# Patient Record
Sex: Male | Born: 1990 | Race: White | Hispanic: No | Marital: Married | Smoking: Current some day smoker
Health system: Southern US, Community
[De-identification: ages and names within clinical notes are randomized; demographics above are authoritative.]

## PROBLEM LIST (undated history)

## (undated) ENCOUNTER — Emergency Department (HOSPITAL_COMMUNITY): Admission: EM | Payer: Non-veteran care | Source: Home / Self Care

## (undated) DIAGNOSIS — F4310 Post-traumatic stress disorder, unspecified: Secondary | ICD-10-CM

## (undated) DIAGNOSIS — G8220 Paraplegia, unspecified: Secondary | ICD-10-CM

## (undated) DIAGNOSIS — F200 Paranoid schizophrenia: Secondary | ICD-10-CM

## (undated) DIAGNOSIS — F419 Anxiety disorder, unspecified: Secondary | ICD-10-CM

## (undated) HISTORY — PX: OTHER SURGICAL HISTORY: SHX169

---
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
## 2012-09-13 ENCOUNTER — Encounter (HOSPITAL_COMMUNITY): Payer: Self-pay | Admitting: Emergency Medicine

## 2012-09-13 ENCOUNTER — Emergency Department (HOSPITAL_COMMUNITY)
Admission: EM | Admit: 2012-09-13 | Discharge: 2012-09-15 | Disposition: A | Discharge status: Home / Self Care | Payer: Non-veteran care | Attending: Emergency Medicine | Admitting: Emergency Medicine

## 2012-09-13 DIAGNOSIS — F4310 Post-traumatic stress disorder, unspecified: Secondary | ICD-10-CM | POA: Insufficient documentation

## 2012-09-13 DIAGNOSIS — F200 Paranoid schizophrenia: Secondary | ICD-10-CM | POA: Insufficient documentation

## 2012-09-13 DIAGNOSIS — R45851 Suicidal ideations: Secondary | ICD-10-CM | POA: Insufficient documentation

## 2012-09-13 DIAGNOSIS — F411 Generalized anxiety disorder: Secondary | ICD-10-CM | POA: Insufficient documentation

## 2012-09-13 DIAGNOSIS — F17200 Nicotine dependence, unspecified, uncomplicated: Secondary | ICD-10-CM | POA: Insufficient documentation

## 2012-09-13 HISTORY — DX: Paranoid schizophrenia: F20.0

## 2012-09-13 HISTORY — DX: Post-traumatic stress disorder, unspecified: F43.10

## 2012-09-13 HISTORY — DX: Anxiety disorder, unspecified: F41.9

## 2012-09-13 LAB — COMPREHENSIVE METABOLIC PANEL
ALT: 14 U/L (ref 0–53)
AST: 18 U/L (ref 0–37)
Albumin: 3.8 g/dL (ref 3.5–5.2)
Alkaline Phosphatase: 50 U/L (ref 39–117)
BUN: 10 mg/dL (ref 6–23)
CO2: 28 mEq/L (ref 19–32)
Calcium: 9 mg/dL (ref 8.4–10.5)
Chloride: 105 mEq/L (ref 96–112)
Creatinine, Ser: 0.79 mg/dL (ref 0.50–1.35)
GFR calc Af Amer: >90 mL/min (ref >90)
GFR calc non Af Amer: >90 mL/min (ref >90)
Glucose, Bld: 77 mg/dL (ref 70–99)
Potassium: 4.2 mEq/L (ref 3.5–5.1)
Sodium: 141 mEq/L (ref 135–145)
Total Bilirubin: 0.2 mg/dL — ABNORMAL LOW (ref 0.3–1.2)
Total Protein: 6.9 g/dL (ref 6.0–8.3)

## 2012-09-13 LAB — CBC WITH DIFFERENTIAL/PLATELET
Basophils Absolute: 0 10*3/uL (ref 0.0–0.1)
Basophils Relative: 1 % (ref 0–1)
Eosinophils Absolute: 0.1 10*3/uL (ref 0.0–0.7)
Eosinophils Relative: 3 % (ref 0–5)
HCT: 39.1 % (ref 39.0–52.0)
Hemoglobin: 14 g/dL (ref 13.0–17.0)
Lymphocytes Relative: 51 % — ABNORMAL HIGH (ref 12–46)
Lymphs Abs: 2.2 10*3/uL (ref 0.7–4.0)
MCH: 32.7 pg (ref 26.0–34.0)
MCHC: 35.8 g/dL (ref 30.0–36.0)
MCV: 91.4 fL (ref 78.0–100.0)
Monocytes Absolute: 0.2 10*3/uL (ref 0.1–1.0)
Monocytes Relative: 6 % (ref 3–12)
Neutro Abs: 1.8 10*3/uL (ref 1.7–7.7)
Neutrophils Relative %: 41 % — ABNORMAL LOW (ref 43–77)
Platelets: 188 10*3/uL (ref 150–400)
RBC: 4.28 MIL/uL (ref 4.22–5.81)
RDW: 13 % (ref 11.5–15.5)
WBC: 4.3 10*3/uL (ref 4.0–10.5)

## 2012-09-13 LAB — RAPID URINE DRUG SCREEN, HOSP PERFORMED
Opiates: NOT DETECTED
Tetrahydrocannabinol: POSITIVE — AB

## 2012-09-13 LAB — SALICYLATE LEVEL: Salicylate Lvl: <2 mg/dL — ABNORMAL LOW (ref 2.8–20.0)

## 2012-09-13 LAB — ACETAMINOPHEN LEVEL: Acetaminophen (Tylenol), Serum: <15 ug/mL (ref 10–30)

## 2012-09-13 MED ORDER — ESCITALOPRAM OXALATE 10 MG PO TABS
10.0000 mg | ORAL_TABLET | Freq: Every day | ORAL | Status: DC
  Filled 2012-09-13: qty 1

## 2012-09-13 MED ORDER — ZOLPIDEM TARTRATE 5 MG PO TABS: 5.0000 mg | ORAL_TABLET | Freq: Every evening | ORAL | Status: DC | PRN

## 2012-09-13 MED ORDER — LORAZEPAM 1 MG PO TABS: 1.0000 mg | ORAL_TABLET | Freq: Three times a day (TID) | ORAL | Status: DC | PRN

## 2012-09-13 MED ORDER — ONDANSETRON HCL 4 MG PO TABS: 4.0000 mg | ORAL_TABLET | Freq: Three times a day (TID) | ORAL | Status: DC | PRN

## 2012-09-13 MED ORDER — ACETAMINOPHEN 325 MG PO TABS: 650.0000 mg | ORAL_TABLET | ORAL | Status: DC | PRN

## 2012-09-13 MED ORDER — IBUPROFEN 600 MG PO TABS: 600.0000 mg | ORAL_TABLET | Freq: Three times a day (TID) | ORAL | Status: DC | PRN

## 2012-09-13 MED ORDER — ALUM & MAG HYDROXIDE-SIMETH 200-200-20 MG/5ML PO SUSP: 30.0000 mL | ORAL | Status: DC | PRN

## 2012-09-13 NOTE — ED Notes (Signed)
telepsych in progress 

## 2012-09-13 NOTE — ED Notes (Signed)
Up to the bathroom 

## 2012-09-13 NOTE — ED Notes (Signed)
Pt transferred from triage, presents for medical clearance.  Pt SI with plan to shoot himself.  Initial triage, states pt had knife in hand with plan to cut his throat.  Pt is a veteran who suffers from PTSD, Anxiety, Depression, Paranoid Schizophrenia. Denies AV hallucinations  Feeling hopeless at present.  Admits to alcohol & marijuana use.  Pt calm & cooperative at present.

## 2012-09-13 NOTE — ED Notes (Signed)
Pt sitting in room,wanting to leave.  Pt reports he is "very particular" about certain things and pointed to the camera, and also stated he was paranoid about things.  ACT is aware that pt is requesting to leave and will see.

## 2012-09-13 NOTE — ED Notes (Signed)
Pt presents to the ED with a complaint of suicide.  Pt was picked up by [employer] with a knife in his hand and makes the statement" I just wanted the police to come at me so I could place the knife against my throat and kill myself."

## 2012-09-13 NOTE — ED Provider Notes (Addendum)
Pt resting, nad. Discussed w act team -- placement pending.  telepsych recommends inpt psych tx and starting lexapro 10 mg a day  Suzi Roots, MD 09/13/12 1610  Suzi Roots, MD 09/13/12 1023

## 2012-09-13 NOTE — ED Notes (Signed)
EDP is aware that the pt is wanting to leave.  Pt sitting on the bed, reports that he is "a used,up,useless person...does not matter..." Pt also reports that he is not going to eat or drink anything because he is afraid it is poisoned and that he has been poisoned before.  Pt is aware that the psychatrist has recommended admission, and that ACT will be in shortly.

## 2012-09-13 NOTE — ED Notes (Signed)
Pt agreed to eat sandwich/soda if ACT gave it to him.

## 2012-09-13 NOTE — ED Provider Notes (Signed)
History     CSN: [id number]  Arrival date & time 09/13/12  6295   First MD Initiated Contact with Patient 09/13/12 0254      Chief Complaint  Patient presents with  . Medical Clearance    (Consider location/radiation/quality/duration/timing/severity/associated sxs/prior treatment) HPI Comments: Patient with a history of PTSD and Paranoid Schizophrenia presents to the ED voluntarily due to having suicidal thoughts.  He reports that he has been having these thoughts for several days.  Today his plan was to slit his throat with a knife.  Police were called and brought him to the ED.  When police arrived at the home the patient had a knife in his hand.  Patient did not cut himself.  He denies HI.  Patient reports that he has been increasingly depressed over the past year.  He is currently not on any psychiatric medications.  He reports that he drinks alcohol a couple of times a week.  Last drink was yesterday.  No recent drug use.    The history is provided by the patient.    Past Medical History  Diagnosis Date  . Anxiety   . PTSD (post-traumatic stress disorder) Paranoid  . Paranoid schizophrenia     History reviewed. No pertinent past surgical history.  History reviewed. No pertinent family history.  History  Substance Use Topics  . Smoking status: Current Some Day Smoker  . Smokeless tobacco: Not on file  . Alcohol Use: Yes      Review of Systems  Psychiatric/Behavioral: Positive for suicidal ideas, sleep disturbance and dysphoric mood. Negative for hallucinations and confusion.  All other systems reviewed and are negative.    Allergies  Sulfa antibiotics  Home Medications  No current outpatient prescriptions on file.  BP 99/59  Pulse 57  Temp 97.4 F (36.3 C) (Oral)  Resp 18  SpO2 97%  Physical Exam  Nursing note and vitals reviewed. Constitutional: He appears well-developed and well-nourished.  HENT:  Head: Normocephalic and atraumatic.  Eyes: Pupils  are equal, round, and reactive to light.  Neck: Normal range of motion. Neck supple.  Cardiovascular: Normal rate, regular rhythm and normal heart sounds.   Pulmonary/Chest: Effort normal and breath sounds normal.  Neurological: He is alert.  Skin: Skin is warm and dry.  Psychiatric: His speech is normal. He is not agitated, not aggressive and not combative. He exhibits a depressed mood. He expresses suicidal ideation. He expresses no homicidal ideation. He expresses suicidal plans. He expresses no homicidal plans.    ED Course  Procedures (including critical care time)  Labs Reviewed  CBC WITH DIFFERENTIAL - Abnormal; Notable for the following:    Neutrophils Relative 41 (*)     Lymphocytes Relative 51 (*)     All other components within normal limits  COMPREHENSIVE METABOLIC PANEL - Abnormal; Notable for the following:    Total Bilirubin 0.2 (*)     All other components within normal limits  ETHANOL - Abnormal; Notable for the following:    Alcohol, Ethyl (B) 119 (*)     All other components within normal limits  SALICYLATE LEVEL - Abnormal; Notable for the following:    Salicylate Lvl <2.0 (*)     All other components within normal limits  ACETAMINOPHEN LEVEL  URINE RAPID DRUG SCREEN (HOSP PERFORMED)   No results found.   No diagnosis found.    MDM  Patient presents with suicidal thoughts with plan.  ACT team has been notified.  Psych holding orders have  been placed.        Pascal Lux [place], PA-C 09/13/12 [phone]

## 2012-09-13 NOTE — ED Notes (Signed)
Telephone report from telepsych--recommends admission and IVC-will fax report

## 2012-09-13 NOTE — ED Notes (Signed)
ACT into see 

## 2012-09-13 NOTE — ED Notes (Signed)
telepsych infor faxed and called

## 2012-09-13 NOTE — ED Provider Notes (Signed)
Medical screening examination/treatment/procedure(s) were performed by non-physician practitioner and as supervising physician I was immediately available for consultation/collaboration.  Sunnie Nielsen, MD 09/13/12 [phone]

## 2012-09-13 NOTE — ED Notes (Signed)
ACT in w/ pt 

## 2012-09-13 NOTE — BH Assessment (Signed)
Assessment Note   Steve [name] is an 21 y.o. male. Patient present to [facility] with a history of PTSD and Paranoid Schizophrenia. He was brought to [facility] via police. They were called (unk caller) and pt was brought to the ED.  Reportedly when police arrived at the home the patient had a knife in his hand. Patient did not cut himself. Pt told examining physician at  [facility] that he has a plan to slit his throat. He also admitted to this writer that he continues to have suicidal thoughts. He has been having increased thoughts for the past several days. He however denied a suicidal plan.  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  He was hospitalized in [place] "last summer" after (1)overdosing and (2)verbalizing suicidal gestures. Patients deployment ended November 2014. Patient reports that he has been increasingly depressed over the past year. His depression is triggered by limited support from family/friends, spouse leaving him, and currently living with a friend after being kicked out of mothers home. He is currently not on any psychiatric medications. No HI. Patient denies AVH's but sts, "People tell me I am hearing voices". Unable to determine if patient is experiencing AVH's. However, patient appears minimally paranoid. He refused food & drinks due to fear of staff poisoning him. Patient agreed to take food only from this writer (sand which and drink were given;patient observed consuming food/drink).  He reports that he drinks alcohol 1-2 times a week. Last drink was yesterday; pt drank 6 beers and 6 shots. He uses THC every other day; last used yesterday. Pt also using methamphetamines (IV) 2x's per month and last used 1 week ago.   Patient completed a telepsych consult this am and inpatient was recommended. Patient requested a 2nd telepsych stating that he was intoxicated when the first one was completed. The 2nd telepsych was  initiated and completed--inpatient was recommended again.   Pt referred to [facility] for inpatient treatment.     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 Anxiety   . PTSD (post-traumatic stress disorder) Paranoid  . Paranoid schizophrenia    Axis IV: housing problems, other psychosocial or environmental problems, problems related to social environment, problems with access to health care services and problems with primary support group Axis V: 31-40 impairment in reality testing  Past Medical History:  Past Medical History  Diagnosis Date  . Anxiety   . PTSD (post-traumatic stress disorder) Paranoid  . Paranoid schizophrenia     History reviewed. No pertinent past surgical history.  Family History: History reviewed. No pertinent family history.  Social History:  reports that he has been smoking.  He does not have any smokeless tobacco history on file. He reports that he drinks alcohol. He reports that he uses illicit drugs (Marijuana).  Additional Social History:  Alcohol / Drug Use Pain Medications: SEE MAR Prescriptions: SEE MAR Over the Counter: SEE MAR History of alcohol / drug use?: Yes Longest period of sobriety (when/how long): unk Negative Consequences of Use: Personal relationships Substance #1 Name of Substance 1: Alcohol  1 - Age of First Use: 13 1 - Amount (size/oz): "a couple of beers"; "varies" 1 - Frequency: 1-2x's per week  1 - Duration: since age 51 1 - Last Use / Amount: 09/12/12 to 09/13/12 (7pm to 2am); pt drank 6 beers and  6 shots Substance #2 Name of Substance 2: THC 2 - Age of First Use: 13 2 - Amount (size/oz): varies 2 - Frequency: "every other day" 2 - Duration: since age 60 on and off 2 - Last Use / Amount: 09/13/2012 prior to arrival Substance #3 Name of Substance 3: Methamphetamine (IV use) 3 - Age of First Use: 21 3 - Amount  (size/oz): Pt sts, "It's not alot" 3 - Frequency: 2x's per month 3 - Duration: ongoing since age 70 3 - Last Use / Amount: 1 week ago  CIWA: CIWA-Ar BP: 95/53 mmHg (RN Janie notified) Pulse Rate: 57  COWS:    Allergies:  Allergies  Allergen Reactions  . Sulfa Antibiotics Rash    Home Medications:  (Not in a hospital admission)  OB/GYN Status:  No LMP for male patient.  General Assessment Data Location of Assessment: [facility] Living Arrangements: Other (Comment) (living with a friend) Can pt return to current living arrangement?: Yes Admission Status: Voluntary Is patient capable of signing voluntary admission?: Yes Transfer from: Acute Hospital Referral Source: Self/Family/Friend  Education Status Is patient currently in school?: No  Risk to self Suicidal Ideation: Yes-Currently Present Suicidal Intent: No (denies intent during assessment; ED notes differs-see note) Is patient at risk for suicide?: Yes Suicidal Plan?: Yes-Currently Present (pt denies during assessment; per ED notes slit throat) Specify Current Suicidal Plan:  (pt denies but per ED notes wanted to slit throat) Access to Means: Yes Specify Access to Suicidal Means:  (sharp objects; knives) What has been your use of drugs/alcohol within the last 12 months?:  (Alcohol, THC, and Methamphetamine) Previous Attempts/Gestures: Yes How many times?:  (1 prior attempt) Other Self Harm Risks:  (cutting (pt has several superficial cuts on arm)) Triggers for Past Attempts: Other (Comment) (pt sts he wanted to get out of the [employer]) Intentional Self Injurious Behavior: Cutting Comment - Self Injurious Behavior:  (pt is calm and cooperative) Family Suicide History: No Recent stressful life event(s): Other (Comment) (no support; 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, living arrangements) Persecutory voices/beliefs?: No Depression: Yes Depression Symptoms: Loss of interest in usual pleasures;Despondent;Feeling worthless/self  pity Substance abuse history and/or treatment for substance abuse?: No Suicide prevention information given to non-admitted patients: Not applicable  Risk to Others Homicidal Ideation: No Thoughts of Harm to Others: No Current Homicidal Intent: No Current Homicidal Plan: No Access to Homicidal Means: No Identified Victim:  (none reported) History of harm to others?: No Assessment of Violence: None Noted Violent Behavior Description:  (pt is calm and cooperative) Does patient have access to weapons?: No Criminal Charges Pending?: No Does patient have a court date: No  Psychosis Hallucinations: Auditory ("People tell me I hear things but it's not true") Delusions: Unspecified (pt denies AVH's)  Mental Status Report Appear/Hygiene: Disheveled Eye Contact: Good Motor Activity: Freedom of movement Speech: Logical/coherent Level of Consciousness: Alert Mood: Preoccupied;Sad;Suspicious Affect: Appropriate to circumstance Anxiety Level: Minimal Thought Processes: Relevant;Circumstantial Judgement: Impaired Orientation: Person;Place;Situation;Time Obsessive Compulsive Thoughts/Behaviors: None  Cognitive Functioning Concentration: Normal Memory: Recent Intact;Remote Intact IQ: Average Insight: Fair Impulse Control: Fair Appetite: Good Weight Loss:  (n/a) Weight Gain:  (n/a) Sleep: No Change Total Hours of Sleep:  ("I sleep for 12 hrs at a time") Vegetative Symptoms: None  ADLScreening [facility] Assessment Services) Patient's cognitive ability adequate to safely complete daily activities?: Yes Patient able to express need for assistance with ADLs?: Yes Independently performs ADLs?: Yes (appropriate for developmental age)  Abuse/Neglect [facility]) Physical Abuse: Denies Verbal Abuse: Denies Sexual  Abuse: Denies  Prior Inpatient Therapy Prior Inpatient Therapy: Yes Prior Therapy Dates:  ("last summer" 2012) Prior Therapy Facilty/Provider(s):  [facility], [place]) Reason for  Treatment:  (2 admissions: (1) overdose; (2) suicidal thoughts)  Prior Outpatient Therapy Prior Outpatient Therapy: No Prior Therapy Dates:  (n/a) Prior Therapy Facilty/Provider(s):  (n/a) Reason for Treatment:  (n/a)  ADL Screening (condition at time of admission) Patient's cognitive ability adequate to safely complete daily activities?: Yes Patient able to express need for assistance with ADLs?: Yes Independently performs ADLs?: Yes (appropriate for developmental age) Weakness of Legs: None Weakness of Arms/Hands: None  Home Assistive Devices/Equipment Home Assistive Devices/Equipment: None    Abuse/Neglect Assessment (Assessment to be complete while patient is alone) Physical Abuse: Denies Verbal Abuse: Denies Sexual Abuse: Denies Exploitation of patient/patient's resources: Denies Self-Neglect: Denies Values / Beliefs Cultural Requests During Hospitalization: None Spiritual Requests During Hospitalization: None   Advance Directives (For Healthcare) Advance Directive: Patient does not have advance directive Nutrition Screen- [facility] Adult/[facility] Patient's home diet: Regular  Additional Information 1:1 In Past 12 Months?: No CIRT Risk: No Elopement Risk: No Does patient have medical clearance?: Yes     Disposition:  Disposition Disposition of Patient: Inpatient treatment program;Referred to [facility]) Type of inpatient treatment program: Adult  On Site Evaluation by:   Reviewed with Physician:     Melynda Ripple [facility] 09/13/2012 5:39 PM

## 2012-09-14 NOTE — ED Notes (Signed)
Pt sitting in floor in room angry at having to stay, "I can't believe I went to war...fought for my country...killed people.. and am being treated like this..treated like a dog...Marland KitchenMarland Kitchen" Pt encouraged to verbalize frustrations, emotional support given

## 2012-09-14 NOTE — BHH Counselor (Signed)
Shuvon Rankin, FNP reviewed Pt's clinical information and declined admission to [facility] due to his acuity and medication non-compliance.  Harlin Rain Patsy [place], LPC, NCC

## 2012-09-14 NOTE — ED Notes (Signed)
CSW met with pt by bedside per request of pt. Pt stated to CSW that he desired to know whom he could contact in regards to his disability claim with the [facility] and also about his [employer] insurance to determine how much this hospital admission would cost. CSW provided pt with [facility] Service Officers contact information so that pt may inquire with the appropriate individuals  to receive clarification and additional information to his questions posed. Pt thanked CSW. No other concerns verbalized at this time.   Janann Colonel., MSW, [facility] Weekend ED Clinical Social Worker [phone]

## 2012-09-14 NOTE — ED Notes (Signed)
Up to the bathroom to shower and change  

## 2012-09-14 NOTE — ED Notes (Signed)
Up to the bathroom 

## 2012-09-14 NOTE — ED Notes (Signed)
Spoke to patient for a very long time in hallway.  Pt feels no self worth and has issues with the fact that he cannot choose suicide if he wants to.  Does not understand why we have to stop him and why we have that right.  Says that if he really wanted to die he would already be dead.  Pt has gone back to room and states he feels better.  Pt cooperative and remaining calm.

## 2012-09-14 NOTE — ED Notes (Signed)
ACT in w/ pt 

## 2012-09-14 NOTE — ED Provider Notes (Signed)
No events overnight, awaiting placement. Telepsych recommends inpatient.   Aowyn Rozeboom B. Bernette Mayers, MD 09/14/12 [phone]

## 2012-09-15 NOTE — BHH Counselor (Signed)
09/14/12; 1651:  Shalita at [facility] declined due to pt not being cooperative with meds.  Pt may be able to be d/c Mon AM.   As of 09/15/12 @ 1058 another telepsych request was initiated.

## 2012-09-15 NOTE — ED Provider Notes (Addendum)
Steve [name] is a 21 y.o. male hx of PTSD, here with suicidal thoughts and plan. Sleeping this AM. Initially recommend inpatient psych admission. ACT will reassess to see if he can contract for safety. Will follow up recs.    Richardean Canal, MD 09/15/12 [phone]  2:28 PM Another psych consult obtained. IVC rescinded by psych. Will d/c home.   Richardean Canal, MD 09/15/12 [phone]

## 2012-09-15 NOTE — BHH Counselor (Signed)
As of 09/15/12 @ 1058 another telepsych request was initiated. Telepsych completed by Dr. Leretha Pol and she recommended discharge. She also recommended that patient is given community mental health centers and substance abuse outpatient clinics for future follow up. Patient was advised not to use alcohol or drugs. The current EDP-Dr. Silverio Lay was notified by this writer of patients recommended disposition. He was also given a copy of the telepsych consult. Patients nurse-Sheila was made aware and also given a copy of the telepsych completed.   Writer provider patient with the appropriate follow up referrals. Patient to be discharged accordingly.

## 2013-05-08 ENCOUNTER — Emergency Department: Payer: Self-pay | Admitting: Emergency Medicine

## 2013-05-08 LAB — CBC WITH DIFFERENTIAL/PLATELET
Basophil #: 0 10*3/uL (ref 0.0–0.1)
Basophil %: 0.4 %
Eosinophil #: 0 10*3/uL (ref 0.0–0.7)
HGB: 17.5 g/dL (ref 13.0–18.0)
Lymphocyte #: 1 10*3/uL (ref 1.0–3.6)
Lymphocyte %: 34.3 %
MCH: 32.1 pg (ref 26.0–34.0)
Neutrophil #: 1.9 10*3/uL (ref 1.4–6.5)
Platelet: 220 10*3/uL (ref 150–440)
RBC: 5.44 10*6/uL (ref 4.40–5.90)
RDW: 14.2 % (ref 11.5–14.5)

## 2013-05-08 LAB — COMPREHENSIVE METABOLIC PANEL
Anion Gap: 9 (ref 7–16)
BUN: 12 mg/dL (ref 7–18)
Bilirubin,Total: 1.6 mg/dL — ABNORMAL HIGH (ref 0.2–1.0)
Calcium, Total: 10.7 mg/dL — ABNORMAL HIGH (ref 8.5–10.1)
Co2: 25 mmol/L (ref 21–32)
Creatinine: 0.98 mg/dL (ref 0.60–1.30)
EGFR (African American): >60
EGFR (Non-African Amer.): >60
Glucose: 114 mg/dL — ABNORMAL HIGH (ref 65–99)
Osmolality: 273 (ref 275–301)
Potassium: 3.6 mmol/L (ref 3.5–5.1)
SGPT (ALT): 32 U/L (ref 12–78)

## 2013-05-08 LAB — DRUG SCREEN, URINE
Amphetamines, Ur Screen: POSITIVE (ref ?–1000)
Barbiturates, Ur Screen: NEGATIVE (ref ?–200)
Benzodiazepine, Ur Scrn: NEGATIVE (ref ?–200)
Cannabinoid 50 Ng, Ur ~~LOC~~: POSITIVE (ref ?–50)
MDMA (Ecstasy)Ur Screen: POSITIVE (ref ?–500)
Methadone, Ur Screen: NEGATIVE (ref ?–300)
Opiate, Ur Screen: NEGATIVE (ref ?–300)
Phencyclidine (PCP) Ur S: NEGATIVE (ref ?–25)

## 2013-05-08 LAB — URINALYSIS, COMPLETE
Blood: NEGATIVE
Glucose,UR: NEGATIVE mg/dL (ref 0–75)
Nitrite: NEGATIVE
RBC,UR: NONE SEEN /HPF (ref 0–5)
Specific Gravity: 1.034 (ref 1.003–1.030)
WBC UR: 4 /HPF (ref 0–5)

## 2013-05-08 LAB — CK TOTAL AND CKMB (NOT AT ARMC): CK, Total: 222 U/L (ref 35–232)

## 2014-02-27 ENCOUNTER — Emergency Department (HOSPITAL_COMMUNITY): Payer: Non-veteran care

## 2014-02-27 ENCOUNTER — Emergency Department (HOSPITAL_COMMUNITY): Payer: Non-veteran care | Admitting: Anesthesiology

## 2014-02-27 ENCOUNTER — Encounter (HOSPITAL_COMMUNITY): Payer: Non-veteran care | Admitting: Anesthesiology

## 2014-02-27 ENCOUNTER — Inpatient Hospital Stay (HOSPITAL_COMMUNITY)
Admission: EM | Admit: 2014-02-27 | Discharge: 2014-03-01 | DRG: 464 | Payer: Non-veteran care | Attending: General Surgery | Admitting: General Surgery

## 2014-02-27 ENCOUNTER — Encounter (HOSPITAL_COMMUNITY): Payer: Self-pay | Admitting: Emergency Medicine

## 2014-02-27 ENCOUNTER — Encounter (HOSPITAL_COMMUNITY): Admission: EM | Payer: Self-pay | Source: Home / Self Care

## 2014-02-27 DIAGNOSIS — F4310 Post-traumatic stress disorder, unspecified: Secondary | ICD-10-CM | POA: Diagnosis present

## 2014-02-27 DIAGNOSIS — S329XXA Fracture of unspecified parts of lumbosacral spine and pelvis, initial encounter for closed fracture: Secondary | ICD-10-CM

## 2014-02-27 DIAGNOSIS — F200 Paranoid schizophrenia: Secondary | ICD-10-CM | POA: Diagnosis present

## 2014-02-27 DIAGNOSIS — S51809A Unspecified open wound of unspecified forearm, initial encounter: Secondary | ICD-10-CM | POA: Diagnosis present

## 2014-02-27 DIAGNOSIS — S32509A Unspecified fracture of unspecified pubis, initial encounter for closed fracture: Principal | ICD-10-CM | POA: Diagnosis present

## 2014-02-27 DIAGNOSIS — Y92410 Unspecified street and highway as the place of occurrence of the external cause: Secondary | ICD-10-CM

## 2014-02-27 DIAGNOSIS — F1210 Cannabis abuse, uncomplicated: Secondary | ICD-10-CM | POA: Diagnosis present

## 2014-02-27 DIAGNOSIS — D62 Acute posthemorrhagic anemia: Secondary | ICD-10-CM | POA: Diagnosis not present

## 2014-02-27 DIAGNOSIS — S32599A Other specified fracture of unspecified pubis, initial encounter for closed fracture: Secondary | ICD-10-CM

## 2014-02-27 DIAGNOSIS — F1010 Alcohol abuse, uncomplicated: Secondary | ICD-10-CM | POA: Diagnosis present

## 2014-02-27 DIAGNOSIS — J982 Interstitial emphysema: Secondary | ICD-10-CM

## 2014-02-27 DIAGNOSIS — F411 Generalized anxiety disorder: Secondary | ICD-10-CM | POA: Diagnosis present

## 2014-02-27 DIAGNOSIS — F17200 Nicotine dependence, unspecified, uncomplicated: Secondary | ICD-10-CM | POA: Diagnosis present

## 2014-02-27 DIAGNOSIS — S51819A Laceration without foreign body of unspecified forearm, initial encounter: Secondary | ICD-10-CM | POA: Diagnosis present

## 2014-02-27 HISTORY — PX: I&D EXTREMITY: SHX5045

## 2014-02-27 LAB — COMPREHENSIVE METABOLIC PANEL
ALT: 12 U/L (ref 0–53)
AST: 18 U/L (ref 0–37)
Albumin: 4.1 g/dL (ref 3.5–5.2)
Alkaline Phosphatase: 60 U/L (ref 39–117)
BUN: 8 mg/dL (ref 6–23)
CALCIUM: 9.2 mg/dL (ref 8.4–10.5)
CO2: 19 meq/L (ref 19–32)
CREATININE: 1.76 mg/dL — AB (ref 0.50–1.35)
Chloride: 101 mEq/L (ref 96–112)
GFR, EST AFRICAN AMERICAN: 61 mL/min — AB (ref >90)
GFR, EST NON AFRICAN AMERICAN: 53 mL/min — AB (ref >90)
GLUCOSE: 137 mg/dL — AB (ref 70–99)
Potassium: 4.5 mEq/L (ref 3.7–5.3)
Sodium: 138 mEq/L (ref 137–147)
Total Bilirubin: 0.6 mg/dL (ref 0.3–1.2)
Total Protein: 6.9 g/dL (ref 6.0–8.3)

## 2014-02-27 LAB — CBC
HCT: 37.5 % — ABNORMAL LOW (ref 39.0–52.0)
Hemoglobin: 13.4 g/dL (ref 13.0–17.0)
MCH: 31.5 pg (ref 26.0–34.0)
MCHC: 35.7 g/dL (ref 30.0–36.0)
MCV: 88 fL (ref 78.0–100.0)
Platelets: 254 10*3/uL (ref 150–400)
RBC: 4.26 MIL/uL (ref 4.22–5.81)
RDW: 13.2 % (ref 11.5–15.5)
WBC: 19.9 10*3/uL — ABNORMAL HIGH (ref 4.0–10.5)

## 2014-02-27 LAB — RAPID URINE DRUG SCREEN, HOSP PERFORMED
Amphetamines: POSITIVE — AB
Barbiturates: NOT DETECTED
Benzodiazepines: POSITIVE — AB
Cocaine: NOT DETECTED
OPIATES: NOT DETECTED
TETRAHYDROCANNABINOL: POSITIVE — AB

## 2014-02-27 LAB — CDS SEROLOGY

## 2014-02-27 LAB — SAMPLE TO BLOOD BANK

## 2014-02-27 LAB — I-STAT CHEM 8, ED
BUN: 7 mg/dL (ref 6–23)
CALCIUM ION: 1.17 mmol/L (ref 1.12–1.23)
CREATININE: 2 mg/dL — AB (ref 0.50–1.35)
Chloride: 104 mEq/L (ref 96–112)
Glucose, Bld: 135 mg/dL — ABNORMAL HIGH (ref 70–99)
HCT: 42 % (ref 39.0–52.0)
HEMOGLOBIN: 14.3 g/dL (ref 13.0–17.0)
Potassium: 4.5 mEq/L (ref 3.7–5.3)
SODIUM: 141 meq/L (ref 137–147)
TCO2: 19 mmol/L (ref 0–100)

## 2014-02-27 LAB — ETHANOL: ALCOHOL ETHYL (B): 67 mg/dL — AB (ref 0–11)

## 2014-02-27 LAB — ACETAMINOPHEN LEVEL: Acetaminophen (Tylenol), Serum: <15 ug/mL (ref 10–30)

## 2014-02-27 LAB — I-STAT CG4 LACTIC ACID, ED: Lactic Acid, Venous: 4.06 mmol/L — ABNORMAL HIGH (ref 0.5–2.2)

## 2014-02-27 LAB — SALICYLATE LEVEL

## 2014-02-27 LAB — PROTIME-INR
INR: 1.08 (ref 0.00–1.49)
PROTHROMBIN TIME: 13.8 s (ref 11.6–15.2)

## 2014-02-27 SURGERY — IRRIGATION AND DEBRIDEMENT EXTREMITY
Anesthesia: Monitor Anesthesia Care | Site: Arm Lower | Laterality: Right

## 2014-02-27 MED ORDER — MIDAZOLAM HCL 2 MG/2ML IJ SOLN
INTRAMUSCULAR | Status: AC
  Filled 2014-02-27: qty 2

## 2014-02-27 MED ORDER — THIAMINE HCL 100 MG/ML IJ SOLN
100.0000 mg | Freq: Every day | INTRAMUSCULAR | Status: DC
  Filled 2014-02-27 (×3): qty 1

## 2014-02-27 MED ORDER — SODIUM CHLORIDE 0.9 % IV BOLUS (SEPSIS)
1000.0000 mL | Freq: Once | INTRAVENOUS | Status: AC
  Administered 2014-02-27: 1000 mL via INTRAVENOUS

## 2014-02-27 MED ORDER — 0.9 % SODIUM CHLORIDE (POUR BTL) OPTIME
TOPICAL | Status: DC | PRN
  Administered 2014-02-27: 1000 mL

## 2014-02-27 MED ORDER — IOHEXOL 300 MG/ML  SOLN
100.0000 mL | Freq: Once | INTRAMUSCULAR | Status: AC | PRN
  Administered 2014-02-27: 100 mL via INTRAVENOUS

## 2014-02-27 MED ORDER — LIDOCAINE HCL (CARDIAC) 20 MG/ML IV SOLN
INTRAVENOUS | Status: AC
  Filled 2014-02-27: qty 5

## 2014-02-27 MED ORDER — HYDROMORPHONE HCL PF 1 MG/ML IJ SOLN
1.0000 mg | INTRAMUSCULAR | Status: DC | PRN
  Administered 2014-02-28 – 2014-03-01 (×5): 1 mg via INTRAVENOUS
  Filled 2014-02-27 (×5): qty 1

## 2014-02-27 MED ORDER — MIDAZOLAM HCL 5 MG/5ML IJ SOLN
INTRAMUSCULAR | Status: DC | PRN
  Administered 2014-02-27: 0.5 mg via INTRAVENOUS
  Administered 2014-02-27: 2 mg via INTRAVENOUS
  Administered 2014-02-27: 1 mg via INTRAVENOUS

## 2014-02-27 MED ORDER — FENTANYL CITRATE 0.05 MG/ML IJ SOLN
INTRAMUSCULAR | Status: AC
  Filled 2014-02-27: qty 5

## 2014-02-27 MED ORDER — CEFAZOLIN SODIUM 1-5 GM-% IV SOLN
1.0000 g | Freq: Once | INTRAVENOUS | Status: AC
  Administered 2014-02-27: 1 g via INTRAVENOUS
  Filled 2014-02-27: qty 50

## 2014-02-27 MED ORDER — ONDANSETRON 4 MG PO TBDP
8.0000 mg | ORAL_TABLET | Freq: Once | ORAL | Status: AC
  Administered 2014-02-27: 8 mg via ORAL
  Filled 2014-02-27: qty 2

## 2014-02-27 MED ORDER — LORAZEPAM 2 MG/ML IJ SOLN: 1.0000 mg | Freq: Four times a day (QID) | INTRAMUSCULAR | Status: DC | PRN

## 2014-02-27 MED ORDER — OXYCODONE HCL 5 MG PO TABS
10.0000 mg | ORAL_TABLET | ORAL | Status: DC | PRN
  Administered 2014-02-27 – 2014-03-01 (×6): 10 mg via ORAL
  Filled 2014-02-27 (×5): qty 2

## 2014-02-27 MED ORDER — CEFAZOLIN SODIUM-DEXTROSE 2-3 GM-% IV SOLR
2.0000 g | INTRAVENOUS | Status: AC
  Administered 2014-02-27: 2 g via INTRAVENOUS

## 2014-02-27 MED ORDER — HYDROMORPHONE HCL PF 1 MG/ML IJ SOLN: 0.2500 mg | INTRAMUSCULAR | Status: DC | PRN

## 2014-02-27 MED ORDER — PROPOFOL 10 MG/ML IV BOLUS
INTRAVENOUS | Status: AC
  Filled 2014-02-27: qty 20

## 2014-02-27 MED ORDER — ADULT MULTIVITAMIN W/MINERALS CH
1.0000 | ORAL_TABLET | Freq: Every day | ORAL | Status: DC
  Administered 2014-02-27 – 2014-03-01 (×3): 1 via ORAL
  Filled 2014-02-27 (×3): qty 1

## 2014-02-27 MED ORDER — FOLIC ACID 1 MG PO TABS
1.0000 mg | ORAL_TABLET | Freq: Every day | ORAL | Status: DC
  Administered 2014-02-27 – 2014-03-01 (×3): 1 mg via ORAL
  Filled 2014-02-27 (×3): qty 1

## 2014-02-27 MED ORDER — OXYCODONE HCL 5 MG/5ML PO SOLN: 5.0000 mg | Freq: Once | ORAL | Status: DC | PRN

## 2014-02-27 MED ORDER — ROCURONIUM BROMIDE 50 MG/5ML IV SOLN
INTRAVENOUS | Status: AC
  Filled 2014-02-27: qty 1

## 2014-02-27 MED ORDER — TETANUS-DIPHTH-ACELL PERTUSSIS 5-2.5-18.5 LF-MCG/0.5 IM SUSP
0.5000 mL | Freq: Once | INTRAMUSCULAR | Status: AC
  Administered 2014-02-27: 0.5 mL via INTRAMUSCULAR
  Filled 2014-02-27: qty 0.5

## 2014-02-27 MED ORDER — GLYCOPYRROLATE 0.2 MG/ML IJ SOLN
INTRAMUSCULAR | Status: AC
  Filled 2014-02-27: qty 3

## 2014-02-27 MED ORDER — ONDANSETRON HCL 4 MG/2ML IJ SOLN: 4.0000 mg | Freq: Four times a day (QID) | INTRAMUSCULAR | Status: DC | PRN

## 2014-02-27 MED ORDER — VITAMIN B-1 100 MG PO TABS
100.0000 mg | ORAL_TABLET | Freq: Every day | ORAL | Status: DC
  Administered 2014-02-27 – 2014-03-01 (×3): 100 mg via ORAL
  Filled 2014-02-27 (×3): qty 1

## 2014-02-27 MED ORDER — ROPIVACAINE HCL 5 MG/ML IJ SOLN
INTRAMUSCULAR | Status: DC | PRN
  Administered 2014-02-27: 25 mL via PERINEURAL

## 2014-02-27 MED ORDER — NEOSTIGMINE METHYLSULFATE 10 MG/10ML IV SOLN
INTRAVENOUS | Status: AC
  Filled 2014-02-27: qty 1

## 2014-02-27 MED ORDER — PROPOFOL INFUSION 10 MG/ML OPTIME
INTRAVENOUS | Status: DC | PRN
  Administered 2014-02-27: 50 ug/kg/min via INTRAVENOUS

## 2014-02-27 MED ORDER — CEFAZOLIN SODIUM-DEXTROSE 2-3 GM-% IV SOLR
2.0000 g | Freq: Three times a day (TID) | INTRAVENOUS | Status: DC
  Administered 2014-02-27 – 2014-03-01 (×5): 2 g via INTRAVENOUS
  Filled 2014-02-27 (×6): qty 50

## 2014-02-27 MED ORDER — SODIUM CHLORIDE 0.9 % IV SOLN
INTRAVENOUS | Status: DC | PRN
  Administered 2014-02-27: 13:00:00 via INTRAVENOUS

## 2014-02-27 MED ORDER — PROMETHAZINE HCL 25 MG/ML IJ SOLN: 6.2500 mg | INTRAMUSCULAR | Status: DC | PRN

## 2014-02-27 MED ORDER — ONDANSETRON HCL 4 MG PO TABS
4.0000 mg | ORAL_TABLET | Freq: Four times a day (QID) | ORAL | Status: DC | PRN
  Administered 2014-02-28: 4 mg via ORAL
  Filled 2014-02-27: qty 1

## 2014-02-27 MED ORDER — ENOXAPARIN SODIUM 40 MG/0.4ML ~~LOC~~ SOLN
40.0000 mg | SUBCUTANEOUS | Status: DC
Start: 2014-02-28 — End: 2014-03-01
  Administered 2014-02-28 – 2014-03-01 (×2): 40 mg via SUBCUTANEOUS
  Filled 2014-02-27 (×3): qty 0.4

## 2014-02-27 MED ORDER — DEXTROSE IN LACTATED RINGERS 5 % IV SOLN
INTRAVENOUS | Status: DC
  Administered 2014-02-27 – 2014-02-28 (×2): via INTRAVENOUS

## 2014-02-27 MED ORDER — OXYCODONE HCL 5 MG PO TABS: 5.0000 mg | ORAL_TABLET | Freq: Once | ORAL | Status: DC | PRN

## 2014-02-27 MED ORDER — ONDANSETRON HCL 4 MG/2ML IJ SOLN
INTRAMUSCULAR | Status: AC
  Filled 2014-02-27: qty 2

## 2014-02-27 MED ORDER — SODIUM CHLORIDE 0.9 % IR SOLN
Status: DC | PRN
  Administered 2014-02-27: 1000 mL
  Administered 2014-02-27: 3000 mL

## 2014-02-27 MED ORDER — SUCCINYLCHOLINE CHLORIDE 20 MG/ML IJ SOLN
INTRAMUSCULAR | Status: AC
  Filled 2014-02-27: qty 1

## 2014-02-27 MED ORDER — LORAZEPAM 1 MG PO TABS
1.0000 mg | ORAL_TABLET | Freq: Four times a day (QID) | ORAL | Status: DC | PRN
  Administered 2014-02-27 – 2014-03-01 (×2): 1 mg via ORAL
  Filled 2014-02-27 (×2): qty 1

## 2014-02-27 MED ORDER — BUPIVACAINE HCL (PF) 0.5 % IJ SOLN
INTRAMUSCULAR | Status: DC | PRN
  Administered 2014-02-27: 20 mL via PERINEURAL

## 2014-02-27 MED ORDER — FENTANYL CITRATE 0.05 MG/ML IJ SOLN
INTRAMUSCULAR | Status: DC | PRN
  Administered 2014-02-27: 50 ug via INTRAVENOUS

## 2014-02-27 MED ORDER — LACTATED RINGERS IV SOLN
INTRAVENOUS | Status: DC | PRN
  Administered 2014-02-27 (×2): via INTRAVENOUS

## 2014-02-27 SURGICAL SUPPLY — 52 items
BANDAGE ELASTIC 3 VELCRO ST LF (GAUZE/BANDAGES/DRESSINGS) ×3 IMPLANT
BANDAGE ELASTIC 4 VELCRO ST LF (GAUZE/BANDAGES/DRESSINGS) ×3 IMPLANT
BANDAGE ELASTIC 6 VELCRO ST LF (GAUZE/BANDAGES/DRESSINGS) IMPLANT
BANDAGE GAUZE ELAST BULKY 4 IN (GAUZE/BANDAGES/DRESSINGS) IMPLANT
BNDG COHESIVE 4X5 TAN STRL (GAUZE/BANDAGES/DRESSINGS) IMPLANT
BNDG GAUZE ELAST 4 BULKY (GAUZE/BANDAGES/DRESSINGS) ×3 IMPLANT
BOOTCOVER CLEANROOM LRG (PROTECTIVE WEAR) IMPLANT
COVER SURGICAL LIGHT HANDLE (MISCELLANEOUS) ×3 IMPLANT
CUFF TOURNIQUET SINGLE 18IN (TOURNIQUET CUFF) ×3 IMPLANT
CUFF TOURNIQUET SINGLE 34IN LL (TOURNIQUET CUFF) IMPLANT
DRAPE U-SHAPE 47X51 STRL (DRAPES) ×3 IMPLANT
DRSG PAD ABDOMINAL 8X10 ST (GAUZE/BANDAGES/DRESSINGS) ×3 IMPLANT
DURAPREP 26ML APPLICATOR (WOUND CARE) IMPLANT
ELECT REM PT RETURN 9FT ADLT (ELECTROSURGICAL)
ELECTRODE REM PT RTRN 9FT ADLT (ELECTROSURGICAL) IMPLANT
EVACUATOR 1/8 PVC DRAIN (DRAIN) IMPLANT
GAUZE XEROFORM 1X8 LF (GAUZE/BANDAGES/DRESSINGS) IMPLANT
GAUZE XEROFORM 5X9 LF (GAUZE/BANDAGES/DRESSINGS) ×3 IMPLANT
GLOVE BIOGEL PI IND STRL 6.5 (GLOVE) ×1 IMPLANT
GLOVE BIOGEL PI INDICATOR 6.5 (GLOVE) ×2
GLOVE BIOGEL PI ORTHO PRO SZ8 (GLOVE) ×2
GLOVE ORTHO TXT STRL SZ7.5 (GLOVE) ×3 IMPLANT
GLOVE PI ORTHO PRO STRL SZ8 (GLOVE) ×1 IMPLANT
GLOVE SURG ORTHO 8.0 STRL STRW (GLOVE) ×6 IMPLANT
GOWN STRL REUS W/ TWL LRG LVL3 (GOWN DISPOSABLE) IMPLANT
GOWN STRL REUS W/TWL LRG LVL3 (GOWN DISPOSABLE)
HANDPIECE INTERPULSE COAX TIP (DISPOSABLE) ×2
KIT BASIN OR (CUSTOM PROCEDURE TRAY) ×3 IMPLANT
KIT ROOM TURNOVER OR (KITS) ×3 IMPLANT
MANIFOLD NEPTUNE II (INSTRUMENTS) ×3 IMPLANT
NS IRRIG 1000ML POUR BTL (IV SOLUTION) ×3 IMPLANT
PACK ORTHO EXTREMITY (CUSTOM PROCEDURE TRAY) ×3 IMPLANT
PAD ARMBOARD 7.5X6 YLW CONV (MISCELLANEOUS) ×6 IMPLANT
PAD CAST 4YDX4 CTTN HI CHSV (CAST SUPPLIES) ×1 IMPLANT
PADDING CAST COTTON 4X4 STRL (CAST SUPPLIES) ×2
SET HNDPC FAN SPRY TIP SCT (DISPOSABLE) ×1 IMPLANT
SPONGE GAUZE 4X4 12PLY (GAUZE/BANDAGES/DRESSINGS) IMPLANT
SPONGE GAUZE 4X4 12PLY STER LF (GAUZE/BANDAGES/DRESSINGS) ×3 IMPLANT
SPONGE LAP 18X18 X RAY DECT (DISPOSABLE) ×9 IMPLANT
STOCKINETTE IMPERVIOUS 9X36 MD (GAUZE/BANDAGES/DRESSINGS) IMPLANT
SUT ETHILON 3 0 PS 1 (SUTURE) ×6 IMPLANT
SUT ETHILON 4 0 PS 2 18 (SUTURE) ×6 IMPLANT
SUT SILK 3 0 (SUTURE) ×2
SUT SILK 3-0 18XBRD TIE 12 (SUTURE) ×1 IMPLANT
TOWEL OR 17X24 6PK STRL BLUE (TOWEL DISPOSABLE) ×3 IMPLANT
TOWEL OR 17X26 10 PK STRL BLUE (TOWEL DISPOSABLE) ×3 IMPLANT
TUBE ANAEROBIC SPECIMEN COL (MISCELLANEOUS) IMPLANT
TUBE CONNECTING 12'X1/4 (SUCTIONS) ×1
TUBE CONNECTING 12X1/4 (SUCTIONS) ×2 IMPLANT
UNDERPAD 30X30 INCONTINENT (UNDERPADS AND DIAPERS) ×6 IMPLANT
WATER STERILE IRR 1000ML POUR (IV SOLUTION) IMPLANT
YANKAUER SUCT BULB TIP NO VENT (SUCTIONS) ×3 IMPLANT

## 2014-02-27 NOTE — Transfer of Care (Signed)
Immediate Anesthesia Transfer of Care Note  Patient: Steve HawthorneSamuel Santaella  Procedure(s) Performed: Procedure(s) with comments: IRRIGATION AND DEBRIDEMENT FOREARM AND REPAIR OF 30cm LACERATION (Right) - Anesthesia Regional with MAC  Patient Location: PACU  Anesthesia Type:MAC combined with regional for post-op pain  Level of Consciousness: awake and alert   Airway & Oxygen Therapy: Patient Spontanous Breathing and Patient connected to nasal cannula oxygen  Post-op Assessment: Report given to PACU RN and Post -op Vital signs reviewed and stable  Post vital signs: Reviewed and stable  Complications: No apparent anesthesia complications
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
## 2014-02-27 NOTE — Progress Notes (Signed)
Chaplain Note: Provided emotional support for patient...patient expressed appreciation for the support.  Chaplain Rayfield CitizenCaroline to  Follow up and continue trying to contact patient's mother. Rutherford NailLeah Smith, Chaplain

## 2014-02-27 NOTE — Progress Notes (Signed)
Orthopedic Tech Progress Note Patient Details:  Mikeal HawthorneSamuel Chrobak 06-Jun-1991 [id number]  Ortho Devices Type of Ortho Device: Arm sling Ortho Device/Splint Interventions: Application   Cammer, Mickie BailJennifer Carol 02/27/2014, 4:53 PM

## 2014-02-27 NOTE — Anesthesia Procedure Notes (Addendum)
Anesthesia Regional Block:  Supraclavicular block  Pre-Anesthetic Checklist: ,, timeout performed, Correct Patient, Correct Site, Correct Laterality, Correct Procedure, Correct Position, site marked, Risks and benefits discussed,  Surgical consent,  Pre-op evaluation,  At surgeon's request and post-op pain management  Laterality: Upper and Right  Prep: chloraprep       Needles:  Injection technique: Single-shot  Needle Type: Echogenic Needle          Additional Needles:  Procedures: ultrasound guided (picture in chart) Supraclavicular block Narrative:  Injection made incrementally with aspirations every 5 mL.  Performed by: Personally  Anesthesiologist: Kaliopi Blyden  Additional Notes: H+P and labs reviewed, risks and benefits discussed with patient, procedure tolerated well without complications

## 2014-02-27 NOTE — ED Notes (Signed)
OBTAINED CONSENT FROM PATIENT FOR OR.  OR CALLED AND WOULD LIKE PATIENT TRANSPORTED TO SS.  CLOTHING REMOVED FROM PATIENT AND PT TRANSPORTED BY ER EMT.
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
## 2014-02-27 NOTE — Progress Notes (Signed)
Pt bag of belonging given to pt in room 5n26 confirmed that all items present

## 2014-02-27 NOTE — Anesthesia Preprocedure Evaluation (Addendum)
Anesthesia Evaluation  Patient identified by MRN, date of birth, ID band Patient awake    Reviewed: Allergy & Precautions, H&P , NPO status , Patient's Chart, lab work & pertinent test results  History of Anesthesia Complications Negative for: history of anesthetic complications  Airway Mallampati: II TM Distance: >3 FB Neck ROM: Full    Dental  (+) Teeth Intact, Dental Advisory Given   Pulmonary neg sleep apnea, neg COPDCurrent Smoker,  breath sounds clear to auscultation        Cardiovascular negative cardio ROS  Rhythm:Regular     Neuro/Psych PSYCHIATRIC DISORDERS Anxiety Schizophrenia negative neurological ROS     GI/Hepatic negative GI ROS, (+)     substance abuse (ETOH, meth and marijuana use yesterday; no cocaine in several months. )  alcohol use, marijuana use and methamphetamine use,   Endo/Other  negative endocrine ROS  Renal/GU ARFRenal disease     Musculoskeletal   Abdominal   Peds  Hematology negative hematology ROS (+)   Anesthesia Other Findings CT- pneumomediastinum, nondisplaced pelvic fracture  Reproductive/Obstetrics                         Anesthesia Physical Anesthesia Plan  ASA: II  Anesthesia Plan: MAC and Regional   Post-op Pain Management:    Induction:   Airway Management Planned: Natural Airway  Additional Equipment: None  Intra-op Plan:   Post-operative Plan:   Informed Consent: I have reviewed the patients History and Physical, chart, labs and discussed the procedure including the risks, benefits and alternatives for the proposed anesthesia with the patient or authorized representative who has indicated his/her understanding and acceptance.   Dental advisory given  Plan Discussed with: CRNA and Surgeon  Anesthesia Plan Comments:         Anesthesia Quick Evaluation
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
## 2014-02-27 NOTE — ED Notes (Signed)
Abnormal Lactic Acid given to RN K[place]a  Lactic Acid 4.06 mmol/L

## 2014-02-27 NOTE — ED Notes (Signed)
Entered room to introduce self, check vitals and hourly rounding.  Ortho here for consultation.  Examined right arm with Dr Jeannett SeniorLandou, informed patient needs to go to OR for care of right arm.  Trauma surgeon also entered room and consulted with patient.

## 2014-02-27 NOTE — ED Notes (Signed)
Pt taken to ct and xray with RN present.

## 2014-02-27 NOTE — Progress Notes (Signed)
Nurse called patients mother to see if she was in the hospital at this time. Patients mother informed Nurse that she was not coming to the hospital as she was waiting to hear more first, as [place]. Patients mother asked Nurse if he was ready to be discharged and Nurse informed patients mother that he was not ready to be discharged. Patients belongings (earrings,  Drivers License, and Visa card) taken to PACU and given to staff.

## 2014-02-28 DIAGNOSIS — D649 Anemia, unspecified: Secondary | ICD-10-CM

## 2014-02-28 LAB — BASIC METABOLIC PANEL
BUN: 9 mg/dL (ref 6–23)
CALCIUM: 8.3 mg/dL — AB (ref 8.4–10.5)
CO2: 27 mEq/L (ref 19–32)
CREATININE: 0.97 mg/dL (ref 0.50–1.35)
Chloride: 104 mEq/L (ref 96–112)
GFR calc Af Amer: >90 mL/min (ref >90)
GLUCOSE: 120 mg/dL — AB (ref 70–99)
Potassium: 4.6 mEq/L (ref 3.7–5.3)
Sodium: 140 mEq/L (ref 137–147)

## 2014-02-28 LAB — CBC
HCT: 22.4 % — ABNORMAL LOW (ref 39.0–52.0)
HEMOGLOBIN: 7.8 g/dL — AB (ref 13.0–17.0)
MCH: 31.3 pg (ref 26.0–34.0)
MCHC: 34.8 g/dL (ref 30.0–36.0)
MCV: 90 fL (ref 78.0–100.0)
Platelets: 148 10*3/uL — ABNORMAL LOW (ref 150–400)
RBC: 2.49 MIL/uL — ABNORMAL LOW (ref 4.22–5.81)
RDW: 13.5 % (ref 11.5–15.5)
WBC: 7.1 10*3/uL (ref 4.0–10.5)

## 2014-02-28 MED ORDER — CEPHALEXIN 500 MG PO CAPS: 500.0000 mg | ORAL_CAPSULE | Freq: Four times a day (QID) | ORAL | Status: DC
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
## 2014-02-28 NOTE — Discharge Instructions (Signed)
Dressings on arm can be changed daily if saturated or soiled.  Change with dry gauze, non-stick if available.
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
## 2014-02-28 NOTE — Progress Notes (Signed)
Clinical Social Worker (CSW) met with patient to discuss outpatient mental health resources. 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 Patient denied suicidal ideations. Patient reported that he uses drugs and alcohol to cope with his mental health symptoms. Patient also stated that he has been living out of his car for 2 months. Patient reported that he recently got out of an inpatient rehab program. Prior to patient living in his car he reported he was staying with his mother in [place]. Patient also reported that he recently received disability and is 100% service connected.  CSW provided patient with the [facility] suicide crisis line number and encouraged him to call anytime he is feeling down or depressed and starts to have thoughts of harming himself. CSW also gave patient national homeless veteran hotline number to call for housing resources through the [facility]. CSW also gave patient [facility] contact information because patient lives in [place]. CSW will continue to follow and assist as needed.    [place], LCSWA Weekend CSW [phone]        

## 2014-03-01 ENCOUNTER — Encounter (HOSPITAL_COMMUNITY): Payer: Self-pay | Admitting: Orthopedic Surgery

## 2014-03-01 DIAGNOSIS — F1010 Alcohol abuse, uncomplicated: Secondary | ICD-10-CM | POA: Diagnosis present

## 2014-03-01 DIAGNOSIS — D62 Acute posthemorrhagic anemia: Secondary | ICD-10-CM | POA: Diagnosis not present

## 2014-03-01 LAB — CBC
HCT: 20.5 % — ABNORMAL LOW (ref 39.0–52.0)
Hemoglobin: 7.5 g/dL — ABNORMAL LOW (ref 13.0–17.0)
MCH: 32.8 pg (ref 26.0–34.0)
MCHC: 36.6 g/dL — ABNORMAL HIGH (ref 30.0–36.0)
MCV: 89.5 fL (ref 78.0–100.0)
PLATELETS: 104 10*3/uL — AB (ref 150–400)
RBC: 2.29 MIL/uL — ABNORMAL LOW (ref 4.22–5.81)
RDW: 13.4 % (ref 11.5–15.5)
WBC: 3.4 10*3/uL — ABNORMAL LOW (ref 4.0–10.5)

## 2014-03-01 MED ORDER — NAPROXEN 500 MG PO TABS
500.0000 mg | ORAL_TABLET | Freq: Two times a day (BID) | ORAL | Status: DC
  Administered 2014-03-01: 500 mg via ORAL
  Filled 2014-03-01 (×3): qty 1

## 2014-03-01 MED ORDER — CEPHALEXIN 500 MG PO CAPS: 500.0000 mg | ORAL_CAPSULE | Freq: Four times a day (QID) | ORAL | Status: DC

## 2014-03-01 MED ORDER — HYDROMORPHONE HCL PF 1 MG/ML IJ SOLN: 0.5000 mg | INTRAMUSCULAR | Status: DC | PRN

## 2014-03-01 MED ORDER — OXYCODONE-ACETAMINOPHEN 10-325 MG PO TABS: 1.0000 | ORAL_TABLET | ORAL | Status: DC | PRN

## 2014-03-01 MED ORDER — OXYCODONE HCL 5 MG PO TABS
10.0000 mg | ORAL_TABLET | ORAL | Status: DC | PRN
  Administered 2014-03-01: 10 mg via ORAL
  Filled 2014-03-01: qty 2

## 2014-03-01 MED ORDER — NAPROXEN 500 MG PO TABS: 500.0000 mg | ORAL_TABLET | Freq: Two times a day (BID) | ORAL | Status: DC

## 2014-03-01 MED ORDER — CEPHALEXIN 500 MG PO CAPS
500.0000 mg | ORAL_CAPSULE | Freq: Four times a day (QID) | ORAL | Status: DC
  Administered 2014-03-01: 500 mg via ORAL
  Filled 2014-03-01 (×6): qty 1

## 2014-03-01 NOTE — Progress Notes (Signed)
Pain a little better now. Will see if it is controlled well enough for him to D/C this PM. Patient examined and I agree with the assessment and plan  Violeta GelinasBurke Kolten Ryback, MD, MPH, FACS Trauma: [phone] General Surgery: [phone]  03/01/2014 10:29 AM

## 2014-03-01 NOTE — Progress Notes (Signed)
[facility] [employer] contacted regarding the discharge date and time of patient. Officer informed and aware of pending discharge for this evening.
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
## 2014-03-09 NOTE — Discharge Summary (Signed)
Burke Thompson, MD, MPH, FACS Trauma: [phone] General Surgery: [phone]  
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
## 2015-01-07 NOTE — Consult Note (Signed)
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   Patient was seen by consultant.   Orders entered.   Comments: Pt seen in ED. He appear more calm and stated that he took the poisonous mushrooom although he was advised otherwise by his friend. Now he is feeling fine and does not have any paranoia, mood or anxiety symptms. He is ready to be discharged and will return to his mother. he is planning to attend the [employer] and the classes will start in Sept. He is non complaint with meds and feels that he does not need any medications.  He appeared more calm and alert today and staff stated that he can be discharged safely with family.  Denied Si/HI or plans.   Plan; I will release IVC.  Pt to return to live with his family.  No meds dispensed.  Electronic Signatures: Rhunette CroftFaheem, Kiowa Hollar S (MD)  (Signed 26-Aug-14 10:16)  Authored: Brief Consult Note   Last Updated: 26-Aug-14 10:16 by Rhunette CroftFaheem, Valory Wetherby S (MD)

## 2015-01-07 NOTE — Consult Note (Signed)
Psychiatry: Followup note on this patient had presented to the emergency room at the end of last week showing symptoms of psychosis and agitation. Today the patient reports that he is feeling better. He is able to tell more lucid and coherent story. He tells me that he had been abusing psychedelic mushrooms and also admits that he has been abusing methamphetamine. He states that his substance abuse problem is his primary issue currently. He is currently denying any psychotic symptoms. Not showing flagrant signs of mania at this point. Denies suicidal ideation. Claims [facility] [place] and that he lives with his mother. He has not been taking medication here in the emergency room. History gathered from the nurses in the chart is that his behavior and mental status has improved over the weekend. was more appropriate in his interaction. Eye contact normal. Psychomotor activity much more relaxed. Thoughts appear to be lucid. History is more coherent. Denies hallucinations. Denies suicidal or homicidal ideation. Not showing the kind of racing thoughts that he had on Friday. is still a little bit agitated and in light of his behavior before the weekend and the fact that we don't have a confirmed discharge plan I do not feel comfortable releasing them immediately from the emergency room. I'm also not clear that he needs to be admitted to the behavioral health unit. We will continue to observe him overnight. If he continues to seem like his symptoms are in remission and we can obtain confirmation that he has a stable place to live and followup treatment we will consider discharge in the morning. 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  Electronic Signatures: Audery Amellapacs, Vincent Ehrler T (MD)  (Signed on 25-Aug-14 23:36)  Authored  Last Updated: 25-Aug-14 23:36 by Audery Amellapacs, Amanii Snethen T  (MD)
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
## 2015-01-07 NOTE — Consult Note (Signed)
Brief Consult Note: Diagnosis: psychosis nos.   Patient was seen by consultant.   Consult note dictated.   Recommend further assessment or treatment.   Orders entered.   Comments: Psychiatry: PAtient seen. Patient agitated and disorganized. Making suicidal statements. Unable to articulate plan for self care. No beds available on the unit. Patient under IVC. Continue observation on the ER and start antipsychotics and prn meds.  Electronic Signatures: Audery Amellapacs, Kalim Kissel T (MD)  (Signed 22-Aug-14 18:29)  Authored: Brief Consult Note   Last Updated: 22-Aug-14 18:29 by Audery Amellapacs, Deola Rewis T (MD)

## 2015-03-27 ENCOUNTER — Encounter (HOSPITAL_COMMUNITY): Payer: Self-pay | Admitting: *Deleted

## 2015-03-27 ENCOUNTER — Emergency Department (HOSPITAL_COMMUNITY)
Admission: EM | Admit: 2015-03-27 | Discharge: 2015-03-29 | Disposition: A | Discharge status: Home / Self Care | Payer: Self-pay | Attending: Emergency Medicine | Admitting: Emergency Medicine

## 2015-03-27 DIAGNOSIS — F1410 Cocaine abuse, uncomplicated: Secondary | ICD-10-CM | POA: Insufficient documentation

## 2015-03-27 DIAGNOSIS — Z720 Tobacco use: Secondary | ICD-10-CM | POA: Insufficient documentation

## 2015-03-27 DIAGNOSIS — Y9389 Activity, other specified: Secondary | ICD-10-CM | POA: Insufficient documentation

## 2015-03-27 DIAGNOSIS — F333 Major depressive disorder, recurrent, severe with psychotic symptoms: Secondary | ICD-10-CM

## 2015-03-27 DIAGNOSIS — T426X2A Poisoning by other antiepileptic and sedative-hypnotic drugs, intentional self-harm, initial encounter: Secondary | ICD-10-CM | POA: Insufficient documentation

## 2015-03-27 DIAGNOSIS — F329 Major depressive disorder, single episode, unspecified: Secondary | ICD-10-CM | POA: Insufficient documentation

## 2015-03-27 DIAGNOSIS — Y9289 Other specified places as the place of occurrence of the external cause: Secondary | ICD-10-CM | POA: Insufficient documentation

## 2015-03-27 DIAGNOSIS — R45851 Suicidal ideations: Secondary | ICD-10-CM

## 2015-03-27 DIAGNOSIS — F1210 Cannabis abuse, uncomplicated: Secondary | ICD-10-CM | POA: Insufficient documentation

## 2015-03-27 DIAGNOSIS — R4689 Other symptoms and signs involving appearance and behavior: Secondary | ICD-10-CM

## 2015-03-27 DIAGNOSIS — T50902A Poisoning by unspecified drugs, medicaments and biological substances, intentional self-harm, initial encounter: Secondary | ICD-10-CM

## 2015-03-27 DIAGNOSIS — Y998 Other external cause status: Secondary | ICD-10-CM | POA: Insufficient documentation

## 2015-03-27 DIAGNOSIS — F1110 Opioid abuse, uncomplicated: Secondary | ICD-10-CM | POA: Insufficient documentation

## 2015-03-27 DIAGNOSIS — R4589 Other symptoms and signs involving emotional state: Secondary | ICD-10-CM

## 2015-03-27 LAB — RAPID URINE DRUG SCREEN, HOSP PERFORMED
Amphetamines: NOT DETECTED
Barbiturates: NOT DETECTED
Benzodiazepines: NOT DETECTED
COCAINE: POSITIVE — AB
Opiates: POSITIVE — AB
TETRAHYDROCANNABINOL: POSITIVE — AB

## 2015-03-27 LAB — COMPREHENSIVE METABOLIC PANEL
ALK PHOS: 30 U/L — AB (ref 38–126)
ALT: 13 U/L — AB (ref 17–63)
AST: 14 U/L — AB (ref 15–41)
Albumin: 3.1 g/dL — ABNORMAL LOW (ref 3.5–5.0)
Anion gap: 4 — ABNORMAL LOW (ref 5–15)
BILIRUBIN TOTAL: 0.3 mg/dL (ref 0.3–1.2)
BUN: 14 mg/dL (ref 6–20)
CALCIUM: 8.2 mg/dL — AB (ref 8.9–10.3)
CHLORIDE: 109 mmol/L (ref 101–111)
CO2: 25 mmol/L (ref 22–32)
Creatinine, Ser: 0.99 mg/dL (ref 0.61–1.24)
GLUCOSE: 96 mg/dL (ref 65–99)
POTASSIUM: 4.1 mmol/L (ref 3.5–5.1)
Sodium: 138 mmol/L (ref 135–145)
Total Protein: 5.3 g/dL — ABNORMAL LOW (ref 6.5–8.1)

## 2015-03-27 LAB — CBC WITH DIFFERENTIAL/PLATELET
BASOS ABS: 0 10*3/uL (ref 0.0–0.1)
Basophils Relative: 1 % (ref 0–1)
Eosinophils Absolute: 0.2 10*3/uL (ref 0.0–0.7)
Eosinophils Relative: 5 % (ref 0–5)
HEMATOCRIT: 39.6 % (ref 39.0–52.0)
Hemoglobin: 14 g/dL (ref 13.0–17.0)
LYMPHS PCT: 28 % (ref 12–46)
Lymphs Abs: 1 10*3/uL (ref 0.7–4.0)
MCH: 31.5 pg (ref 26.0–34.0)
MCHC: 35.4 g/dL (ref 30.0–36.0)
MCV: 89 fL (ref 78.0–100.0)
MONO ABS: 0.3 10*3/uL (ref 0.1–1.0)
MONOS PCT: 7 % (ref 3–12)
Neutro Abs: 2.2 10*3/uL (ref 1.7–7.7)
Neutrophils Relative %: 59 % (ref 43–77)
Platelets: 147 10*3/uL — ABNORMAL LOW (ref 150–400)
RBC: 4.45 MIL/uL (ref 4.22–5.81)
RDW: 13 % (ref 11.5–15.5)
WBC: 3.7 10*3/uL — AB (ref 4.0–10.5)

## 2015-03-27 LAB — SALICYLATE LEVEL

## 2015-03-27 LAB — ACETAMINOPHEN LEVEL: Acetaminophen (Tylenol), Serum: <10 ug/mL — ABNORMAL LOW (ref 10–30)

## 2015-03-27 LAB — ETHANOL: Alcohol, Ethyl (B): <5 mg/dL (ref <5)

## 2015-03-27 MED ORDER — SODIUM CHLORIDE 0.9 % IV BOLUS (SEPSIS)
500.0000 mL | Freq: Once | INTRAVENOUS | Status: AC
  Administered 2015-03-27: 500 mL via INTRAVENOUS

## 2015-03-27 MED ORDER — STERILE WATER FOR INJECTION IJ SOLN
INTRAMUSCULAR | Status: AC
  Administered 2015-03-27: 14:00:00
  Filled 2015-03-27: qty 10

## 2015-03-27 MED ORDER — ZIPRASIDONE MESYLATE 20 MG IM SOLR
INTRAMUSCULAR | Status: AC
  Administered 2015-03-27: 10 mg
  Filled 2015-03-27: qty 20

## 2015-03-27 MED ORDER — NALOXONE HCL 1 MG/ML IJ SOLN
1.0000 mg | Freq: Once | INTRAMUSCULAR | Status: AC
  Administered 2015-03-27: 1 mg via SUBCUTANEOUS
  Filled 2015-03-27: qty 2

## 2015-03-27 NOTE — ED Notes (Signed)
Pt. Noted sleeping in room. No complaints or concerns voiced. No distress or abnormal behavior noted. Will continue to monitor with security cameras. Q 15 minute rounds continue. 

## 2015-03-27 NOTE — ED Notes (Signed)
Bed: WA08 Expected date:  Expected time:  Means of arrival:  Comments: EMS 

## 2015-03-27 NOTE — ED Notes (Signed)
Report received from Janie Rambo RN. Pt. Alert and oriented in no distress denies SI, HI, AVH and pain.  Pt. Instructed to come to me with problems or concerns.Will continue to monitor for safety via security cameras and Q 15 minute checks. 
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
## 2015-03-27 NOTE — ED Notes (Signed)
Per EMS pt coming in with c/o drug overdose, per EMS pt ingested 30 ambiens (10 mg) in an attempt to kill himself.
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
## 2015-03-27 NOTE — ED Notes (Signed)
Pt ambulatory w/o difficulty to SAPPU w/ [employer], to bathroom on arrival

## 2015-03-27 NOTE — ED Notes (Signed)
Upon arrival to room pt attempted to leave the room, sts "Why don't you let man die when he wants to?", pt also has superficial marks on his thighs and neck which EMS reports are from pt cutting himself. No bleeding noted

## 2015-03-27 NOTE — ED Notes (Signed)
Pt. In Rm#8. All clothes removed including shirt, shorts, socks, shoes, lighter, earrings w/backs, armband; pt. given burgandy scrubs. Pt. Clothes placed in labeled belongings bag under NS across from Rm4. [place] RN advised. ENM

## 2015-03-27 NOTE — ED Notes (Signed)
Per MD-hold on IV.  Patient awake and cooperative with staff.  Restraints d/c'd per MD.  Patient talking with staff.  Able to stand to urinate in urinal

## 2015-03-27 NOTE — ED Notes (Signed)
1315: Pt pulled out IV, attempted to leave room, stating he wants to die, [employer] at bedside and security officers helped restrain patient (pt placed in handcuffs by police officers). Pt is crying asking to leave him alone so that he can die. Verbal order received and carried for Geodon 10 mg. 1330:  Dr Estell HarpinZammit at bedside to reevaluate pt. Pt is restrained in bed, however he is still attempting to get up. 1400:  Verbal order received for restraints.

## 2015-03-27 NOTE — ED Notes (Signed)
Per ED charge poison control had been contacted and recommend supportive care.

## 2015-03-28 DIAGNOSIS — T50901A Poisoning by unspecified drugs, medicaments and biological substances, accidental (unintentional), initial encounter: Secondary | ICD-10-CM | POA: Insufficient documentation

## 2015-03-28 DIAGNOSIS — F333 Major depressive disorder, recurrent, severe with psychotic symptoms: Secondary | ICD-10-CM

## 2015-03-28 DIAGNOSIS — R45851 Suicidal ideations: Secondary | ICD-10-CM

## 2015-03-28 MED ORDER — DIPHENHYDRAMINE HCL 25 MG PO CAPS
25.0000 mg | ORAL_CAPSULE | Freq: Once | ORAL | Status: AC
  Administered 2015-03-28: 25 mg via ORAL
  Filled 2015-03-28: qty 1

## 2015-03-28 NOTE — ED Notes (Signed)
Pt AAO x 3, no distress noted, calm & cooperative, interactive with staff.  Monitoring for safety, Q 15 min checks in effect. 

## 2015-03-28 NOTE — BHH Counselor (Signed)
This patient's supporting information was faxed to the following facilities for review:  [facility]  [place], [place] Counsel[place]

## 2015-03-28 NOTE — ED Notes (Signed)
Pt alert and oriented.  Denies SI HI or AVH.  Paced around unit wanting to leave.  Pt is safe on unit with 15 minute checks in place.

## 2015-03-28 NOTE — ED Notes (Signed)
Pt. Noted sleeping in room. No complaints or concerns voiced. No distress or abnormal behavior noted. Will continue to monitor with security cameras. Q 15 minute rounds continue. 
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
## 2015-03-29 DIAGNOSIS — F333 Major depressive disorder, recurrent, severe with psychotic symptoms: Secondary | ICD-10-CM

## 2015-03-29 DIAGNOSIS — R45851 Suicidal ideations: Secondary | ICD-10-CM

## 2015-03-29 DIAGNOSIS — T1491 Suicide attempt: Secondary | ICD-10-CM

## 2015-03-29 DIAGNOSIS — T426X2A Poisoning by other antiepileptic and sedative-hypnotic drugs, intentional self-harm, initial encounter: Secondary | ICD-10-CM
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
## 2015-03-29 NOTE — Progress Notes (Addendum)
pcp is [facility] [address], [place], [place] [address] [phone] per pt Cm noted pt to be walking the hallway to and from his room to the nursing station. Pt pleasant and cooperative No aggression noted.  

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
## 2015-03-29 NOTE — BH Assessment (Signed)
BHH Assessment Progress Note  Per Thedore MinsMojeed Akintayo, MD, this pt does not require psychiatric hospitalization at this time.  [facility] substance abuse treatment information.  IVC has been rescinded.  Pt's discharge instructions include referral [facility] for [facility].  Pt's nurse, Kendal Hymendie, has been notified.   Doylene Canninghomas Takoda Siedlecki, MA Triage Specialist [phone]

## 2015-03-29 NOTE — Discharge Instructions (Signed)
To help you maintain a sober lifestyle, a substance abuse treatment program may be beneficial to you.  Contact [facility] at your earliest opportunity to see about enrolling in their program:       [facility]      [address]      [place], [place] [address]      [phone]      New patients are seen at the walk-in clinic every Tuesday from 9:00 am - 12:00 pm.
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
## 2015-11-11 ENCOUNTER — Emergency Department (HOSPITAL_COMMUNITY)
Admission: EM | Admit: 2015-11-11 | Discharge: 2015-11-12 | Disposition: A | Discharge status: Other Acute Inpatient Hospital | Payer: Non-veteran care | Attending: Emergency Medicine | Admitting: Emergency Medicine

## 2015-11-11 ENCOUNTER — Encounter (HOSPITAL_COMMUNITY): Payer: Self-pay | Admitting: Oncology

## 2015-11-11 DIAGNOSIS — F17200 Nicotine dependence, unspecified, uncomplicated: Secondary | ICD-10-CM | POA: Insufficient documentation

## 2015-11-11 DIAGNOSIS — F419 Anxiety disorder, unspecified: Secondary | ICD-10-CM

## 2015-11-11 DIAGNOSIS — Y998 Other external cause status: Secondary | ICD-10-CM | POA: Diagnosis not present

## 2015-11-11 DIAGNOSIS — T43592A Poisoning by other antipsychotics and neuroleptics, intentional self-harm, initial encounter: Secondary | ICD-10-CM | POA: Insufficient documentation

## 2015-11-11 DIAGNOSIS — F1210 Cannabis abuse, uncomplicated: Secondary | ICD-10-CM | POA: Diagnosis not present

## 2015-11-11 DIAGNOSIS — F4310 Post-traumatic stress disorder, unspecified: Secondary | ICD-10-CM

## 2015-11-11 DIAGNOSIS — F1410 Cocaine abuse, uncomplicated: Secondary | ICD-10-CM | POA: Diagnosis not present

## 2015-11-11 DIAGNOSIS — Y9289 Other specified places as the place of occurrence of the external cause: Secondary | ICD-10-CM | POA: Diagnosis not present

## 2015-11-11 DIAGNOSIS — Y9389 Activity, other specified: Secondary | ICD-10-CM | POA: Diagnosis not present

## 2015-11-11 DIAGNOSIS — R45851 Suicidal ideations: Secondary | ICD-10-CM | POA: Diagnosis present

## 2015-11-11 DIAGNOSIS — F332 Major depressive disorder, recurrent severe without psychotic features: Secondary | ICD-10-CM

## 2015-11-11 DIAGNOSIS — T43502A Poisoning by unspecified antipsychotics and neuroleptics, intentional self-harm, initial encounter: Secondary | ICD-10-CM

## 2015-11-11 DIAGNOSIS — Z79899 Other long term (current) drug therapy: Secondary | ICD-10-CM | POA: Insufficient documentation

## 2015-11-11 DIAGNOSIS — F333 Major depressive disorder, recurrent, severe with psychotic symptoms: Secondary | ICD-10-CM | POA: Diagnosis present

## 2015-11-11 DIAGNOSIS — F1010 Alcohol abuse, uncomplicated: Secondary | ICD-10-CM | POA: Diagnosis not present

## 2015-11-11 DIAGNOSIS — F200 Paranoid schizophrenia: Secondary | ICD-10-CM | POA: Insufficient documentation

## 2015-11-11 LAB — COMPREHENSIVE METABOLIC PANEL
ALT: 13 U/L — ABNORMAL LOW (ref 17–63)
ANION GAP: 11 (ref 5–15)
AST: 17 U/L (ref 15–41)
Albumin: 4.7 g/dL (ref 3.5–5.0)
Alkaline Phosphatase: 56 U/L (ref 38–126)
BILIRUBIN TOTAL: 0.9 mg/dL (ref 0.3–1.2)
BUN: 11 mg/dL (ref 6–20)
CO2: 23 mmol/L (ref 22–32)
Calcium: 9.4 mg/dL (ref 8.9–10.3)
Chloride: 105 mmol/L (ref 101–111)
Creatinine, Ser: 1 mg/dL (ref 0.61–1.24)
GFR calc non Af Amer: >60 mL/min (ref >60)
Glucose, Bld: 101 mg/dL — ABNORMAL HIGH (ref 65–99)
POTASSIUM: 4.4 mmol/L (ref 3.5–5.1)
Sodium: 139 mmol/L (ref 135–145)
TOTAL PROTEIN: 7.4 g/dL (ref 6.5–8.1)

## 2015-11-11 LAB — RAPID URINE DRUG SCREEN, HOSP PERFORMED
AMPHETAMINES: NOT DETECTED
Barbiturates: NOT DETECTED
Benzodiazepines: NOT DETECTED
Cocaine: POSITIVE — AB
OPIATES: NOT DETECTED
Tetrahydrocannabinol: POSITIVE — AB

## 2015-11-11 LAB — CBC
HCT: 41.2 % (ref 39.0–52.0)
HEMOGLOBIN: 14.7 g/dL (ref 13.0–17.0)
MCH: 31.7 pg (ref 26.0–34.0)
MCHC: 35.7 g/dL (ref 30.0–36.0)
MCV: 88.8 fL (ref 78.0–100.0)
PLATELETS: 132 10*3/uL — AB (ref 150–400)
RBC: 4.64 MIL/uL (ref 4.22–5.81)
RDW: 12.4 % (ref 11.5–15.5)
WBC: 4.2 10*3/uL (ref 4.0–10.5)

## 2015-11-11 LAB — ETHANOL: ALCOHOL ETHYL (B): 70 mg/dL — AB (ref <5)

## 2015-11-11 LAB — CBG MONITORING, ED: GLUCOSE-CAPILLARY: 90 mg/dL (ref 65–99)

## 2015-11-11 NOTE — ED Notes (Signed)
Patient made aware that urine sample is needed. Patient states he cannot void at this time. 

## 2015-11-11 NOTE — ED Notes (Signed)
Sitter at bedside.

## 2015-11-11 NOTE — ED Provider Notes (Signed)
CSN: [id number]     Arrival date & time 11/11/15  2245 History  By signing my name below, I, Steve [name], attest that this documentation has been prepared under the direction and in the presence of Steve Crease, MD. Electronically Signed: Evon [name], ED Scribe. 11/11/2015. 5:50 AM.    Chief Complaint  Patient presents with  . Drug Overdose    Patient is a 25 y.o. male presenting with Overdose. The history is provided by the patient. No language interpreter was used.  Drug Overdose   HPI Comments: Level 5 Caveat: Drug overdose  Steve [name] is a 25 y.o. male who presents to the Emergency Department for drug overdose tonight. Pt intentionally overdosed on 10  of Seroquel. Pt reprots ETOH use as well.   Past Medical History  Diagnosis Date  . Anxiety   . PTSD (post-traumatic stress disorder) Paranoid  . Paranoid schizophrenia [facility])    Past Surgical History  Procedure Laterality Date  . I&d extremity Right 02/27/2014    Procedure: IRRIGATION AND DEBRIDEMENT FOREARM AND REPAIR OF 30cm LACERATION;  Surgeon: Eulas Post, MD;  Location: [facility] OR;  Service: Orthopedics;  Laterality: Right;  [facility]   No family history on file. Social History  Substance Use Topics  . Smoking status: Current Some Day Smoker  . Smokeless tobacco: None  . Alcohol Use: Yes    Review of Systems  Unable to perform ROS: Other      Allergies  Sulfa antibiotics  Home Medications   Prior to Admission medications   Medication Sig Start Date End Date Taking? Authorizing Provider  benztropine (COGENTIN) 0.5 MG tablet Take 0.5 mg by mouth 2 (two) times daily.  10/18/15  Yes Historical Provider, MD  busPIRone (BUSPAR) 10 MG tablet Take 10 mg by mouth 3 (three) times daily.  10/18/15  Yes Historical Provider, MD  doxepin (SINEQUAN) 100 MG capsule Take 100 mg by mouth at bedtime.  10/18/15  Yes Historical Provider, MD  perphenazine (TRILAFON) 2 MG tablet Take 2 mg  by mouth 3 (three) times daily.  10/18/15  Yes Historical Provider, MD  prazosin (MINIPRESS) 2 MG capsule Take 2 mg by mouth at bedtime.  10/18/15  Yes Historical Provider, MD  SEROQUEL 400 MG tablet Take 400 mg by mouth at bedtime.  10/18/15  Yes Historical Provider, MD  vitamin B-12 (CYANOCOBALAMIN) 1000 MCG tablet Take 1,000 mcg by mouth daily.  10/18/15  Yes Historical Provider, MD  ZOLOFT 100 MG tablet Take 100 mg by mouth daily.  10/18/15  Yes Historical Provider, MD  divalproex (DEPAKOTE ER) 500 MG 24 hr tablet Take 500 mg by mouth daily.  10/18/15   Historical Provider, MD  gabapentin (NEURONTIN) 300 MG capsule Take 300 mg by mouth 2 (two) times daily.  10/18/15   Historical Provider, MD   BP 127/94 mmHg  Pulse 98  Temp(Src) 97.7 F (36.5 C) (Oral)  Resp 10  SpO2 100%   Physical Exam  Constitutional: He appears well-developed and well-nourished. No distress.  HENT:  Head: Normocephalic and atraumatic.  Right Ear: Hearing normal.  Left Ear: Hearing normal.  Nose: Nose normal.  Mouth/Throat: Oropharynx is clear and moist and mucous membranes are normal.  Eyes: Conjunctivae and EOM are normal. Pupils are equal, round, and reactive to light.  Neck: Normal range of motion. Neck supple.  Cardiovascular: Regular rhythm, S1 normal and S2 normal.  Exam reveals no gallop and no friction rub.   No murmur heard.  Pulmonary/Chest: Effort normal and breath sounds normal. No respiratory distress. He exhibits no tenderness.  Abdominal: Soft. Normal appearance and bowel sounds are normal. There is no hepatosplenomegaly. There is no tenderness. There is no rebound, no guarding, no tenderness at McBurney's point and negative Murphy's sign. No hernia.  Musculoskeletal: Normal range of motion.  Neurological: He is alert. He has normal strength. No cranial nerve deficit or sensory deficit. Coordination normal. GCS eye subscore is 4. GCS verbal subscore is 5. GCS motor subscore is 6.  Somnolent but arousable    Skin: Skin is warm, dry and intact. No rash noted. No cyanosis.  Psychiatric: His speech is normal.  Nursing note and vitals reviewed.   ED Course  Procedures (including critical care time) DIAGNOSTIC STUDIES: Oxygen Saturation is 99% on RA, normal by my interpretation.    COORDINATION OF CARE:    Labs Review Labs Reviewed  COMPREHENSIVE METABOLIC PANEL - Abnormal; Notable for the following:    Glucose, Bld 101 (*)    ALT 13 (*)    All other components within normal limits  ETHANOL - Abnormal; Notable for the following:    Alcohol, Ethyl (B) 70 (*)    All other components within normal limits  ACETAMINOPHEN LEVEL - Abnormal; Notable for the following:    Acetaminophen (Tylenol), Serum <10 (*)    All other components within normal limits  CBC - Abnormal; Notable for the following:    Platelets 132 (*)    All other components within normal limits  URINE RAPID DRUG SCREEN, HOSP PERFORMED - Abnormal; Notable for the following:    Cocaine POSITIVE (*)    Tetrahydrocannabinol POSITIVE (*)    All other components within normal limits  SALICYLATE LEVEL  CBG MONITORING, ED    Imaging Review No results found. I have personally reviewed and evaluated these lab results as part of my medical decision-making.   EKG Interpretation   Date/Time:  Friday November 11 2015 22:50:18 EST Ventricular Rate:  121 PR Interval:  160 QRS Duration: 89 QT Interval:  317 QTC Calculation: 450 R Axis:   78 Text Interpretation:  Sinus tachycardia Otherwise within normal limits  Confirmed by Steve Dissinger  MD, Steve [name] [phone]) on 11/11/2015 11:17:07 PM      MDM   Final diagnoses:  Depression  Overdose of antipsychotic, intentional self-harm, initial encounter [facility])   Presented to the ER for evaluation after an intentional overdose. Patient reports drinking alcohol and taking an overdose of Seroquel.poison control reports potential for sedation and hypotension. Patient has not been  hypotensive here in the ER. He has been sedated and somnolent. He is arousable, however. He has a gag reflex is protecting his airway. Patient will be observed in the ER until more alert and able to participate in psychiatric evaluation.  I personally performed the services described in this documentation, which was scribed in my presence. The recorded information has been reviewed and is accurate.      Steve Crease, MD 11/12/15 [phone]

## 2015-11-11 NOTE — ED Notes (Signed)
Pt intentionally overdosed on 10 400 mg Seroquel and ETOH in a suicide attempt.   Pt is still speaking at this time.

## 2015-11-11 NOTE — ED Notes (Signed)
MD at bedside. 

## 2015-11-12 ENCOUNTER — Emergency Department (HOSPITAL_COMMUNITY): Payer: Non-veteran care

## 2015-11-12 ENCOUNTER — Inpatient Hospital Stay (HOSPITAL_COMMUNITY)
Admission: AD | Admit: 2015-11-12 | Discharge: 2015-11-15 | DRG: 885 | Disposition: A | Discharge status: Home / Self Care | Payer: No Typology Code available for payment source | Source: Intra-hospital | Attending: Psychiatry | Admitting: Psychiatry

## 2015-11-12 DIAGNOSIS — F411 Generalized anxiety disorder: Secondary | ICD-10-CM | POA: Diagnosis present

## 2015-11-12 DIAGNOSIS — F1010 Alcohol abuse, uncomplicated: Secondary | ICD-10-CM | POA: Diagnosis present

## 2015-11-12 DIAGNOSIS — T43592A Poisoning by other antipsychotics and neuroleptics, intentional self-harm, initial encounter: Secondary | ICD-10-CM | POA: Diagnosis not present

## 2015-11-12 DIAGNOSIS — F4310 Post-traumatic stress disorder, unspecified: Secondary | ICD-10-CM

## 2015-11-12 DIAGNOSIS — F17210 Nicotine dependence, cigarettes, uncomplicated: Secondary | ICD-10-CM | POA: Diagnosis present

## 2015-11-12 DIAGNOSIS — F4312 Post-traumatic stress disorder, chronic: Secondary | ICD-10-CM | POA: Diagnosis present

## 2015-11-12 DIAGNOSIS — F419 Anxiety disorder, unspecified: Secondary | ICD-10-CM

## 2015-11-12 DIAGNOSIS — F333 Major depressive disorder, recurrent, severe with psychotic symptoms: Secondary | ICD-10-CM | POA: Diagnosis present

## 2015-11-12 DIAGNOSIS — G4700 Insomnia, unspecified: Secondary | ICD-10-CM | POA: Diagnosis present

## 2015-11-12 DIAGNOSIS — F410 Panic disorder [episodic paroxysmal anxiety] without agoraphobia: Secondary | ICD-10-CM | POA: Diagnosis present

## 2015-11-12 DIAGNOSIS — Z818 Family history of other mental and behavioral disorders: Secondary | ICD-10-CM | POA: Diagnosis not present

## 2015-11-12 DIAGNOSIS — R45851 Suicidal ideations: Secondary | ICD-10-CM

## 2015-11-12 HISTORY — DX: Anxiety disorder, unspecified: F41.9

## 2015-11-12 LAB — SALICYLATE LEVEL

## 2015-11-12 LAB — ACETAMINOPHEN LEVEL: Acetaminophen (Tylenol), Serum: <10 ug/mL — ABNORMAL LOW (ref 10–30)

## 2015-11-12 MED ORDER — DIVALPROEX SODIUM ER 500 MG PO TB24
500.0000 mg | ORAL_TABLET | Freq: Every day | ORAL | Status: DC
  Administered 2015-11-12: 500 mg via ORAL
  Filled 2015-11-12: qty 1

## 2015-11-12 MED ORDER — PRAZOSIN HCL 2 MG PO CAPS
2.0000 mg | ORAL_CAPSULE | Freq: Every day | ORAL | Status: DC
  Filled 2015-11-12: qty 1

## 2015-11-12 MED ORDER — BUSPIRONE HCL 10 MG PO TABS: 10.0000 mg | ORAL_TABLET | Freq: Three times a day (TID) | ORAL | Status: DC

## 2015-11-12 MED ORDER — MAGNESIUM HYDROXIDE 400 MG/5ML PO SUSP
30.0000 mL | Freq: Every day | ORAL | Status: DC | PRN
  Administered 2015-11-14: 30 mL via ORAL
  Filled 2015-11-12: qty 30

## 2015-11-12 MED ORDER — DIVALPROEX SODIUM ER 500 MG PO TB24
500.0000 mg | ORAL_TABLET | Freq: Every day | ORAL | Status: DC
  Administered 2015-11-13 – 2015-11-15 (×3): 500 mg via ORAL
  Filled 2015-11-12 (×4): qty 1
  Filled 2015-11-12: qty 7

## 2015-11-12 MED ORDER — NICOTINE 21 MG/24HR TD PT24
21.0000 mg | MEDICATED_PATCH | Freq: Every day | TRANSDERMAL | Status: DC
  Administered 2015-11-13 – 2015-11-15 (×3): 21 mg via TRANSDERMAL
  Filled 2015-11-12 (×6): qty 1

## 2015-11-12 MED ORDER — BUSPIRONE HCL 10 MG PO TABS
10.0000 mg | ORAL_TABLET | Freq: Three times a day (TID) | ORAL | Status: DC
Start: 2015-11-13 — End: 2015-11-15
  Administered 2015-11-13 – 2015-11-15 (×8): 10 mg via ORAL
  Filled 2015-11-12 (×7): qty 1
  Filled 2015-11-12 (×3): qty 21
  Filled 2015-11-12 (×3): qty 1

## 2015-11-12 MED ORDER — PRAZOSIN HCL 2 MG PO CAPS
2.0000 mg | ORAL_CAPSULE | Freq: Every day | ORAL | Status: DC
  Administered 2015-11-14: 2 mg via ORAL
  Filled 2015-11-12: qty 1
  Filled 2015-11-12: qty 7
  Filled 2015-11-12: qty 2
  Filled 2015-11-12 (×3): qty 1

## 2015-11-12 MED ORDER — GABAPENTIN 300 MG PO CAPS
300.0000 mg | ORAL_CAPSULE | Freq: Two times a day (BID) | ORAL | Status: DC
  Administered 2015-11-13 – 2015-11-15 (×5): 300 mg via ORAL
  Filled 2015-11-12: qty 14
  Filled 2015-11-12 (×2): qty 1
  Filled 2015-11-12: qty 14
  Filled 2015-11-12 (×6): qty 1

## 2015-11-12 MED ORDER — BENZTROPINE MESYLATE 1 MG PO TABS
0.5000 mg | ORAL_TABLET | Freq: Two times a day (BID) | ORAL | Status: DC
  Administered 2015-11-12: 0.5 mg via ORAL
  Filled 2015-11-12: qty 1

## 2015-11-12 MED ORDER — ALUM & MAG HYDROXIDE-SIMETH 200-200-20 MG/5ML PO SUSP: 30.0000 mL | ORAL | Status: DC | PRN

## 2015-11-12 MED ORDER — GABAPENTIN 300 MG PO CAPS: 300.0000 mg | ORAL_CAPSULE | Freq: Two times a day (BID) | ORAL | Status: DC

## 2015-11-12 MED ORDER — DIVALPROEX SODIUM ER 500 MG PO TB24: 500.0000 mg | ORAL_TABLET | Freq: Two times a day (BID) | ORAL | Status: DC

## 2015-11-12 MED ORDER — PRAZOSIN HCL 2 MG PO CAPS: 2.0000 mg | ORAL_CAPSULE | Freq: Every day | ORAL | Status: DC

## 2015-11-12 MED ORDER — VITAMIN B-1 100 MG PO TABS
100.0000 mg | ORAL_TABLET | Freq: Every day | ORAL | Status: DC
Start: 2015-11-12 — End: 2015-11-12
  Administered 2015-11-12: 100 mg via ORAL
  Filled 2015-11-12: qty 1

## 2015-11-12 MED ORDER — THIAMINE HCL 100 MG/ML IJ SOLN: 100.0000 mg | Freq: Every day | INTRAMUSCULAR | Status: DC

## 2015-11-12 MED ORDER — BUSPIRONE HCL 10 MG PO TABS
10.0000 mg | ORAL_TABLET | Freq: Three times a day (TID) | ORAL | Status: DC
  Administered 2015-11-12: 10 mg via ORAL
  Filled 2015-11-12: qty 1

## 2015-11-12 MED ORDER — ACETAMINOPHEN 325 MG PO TABS
650.0000 mg | ORAL_TABLET | Freq: Four times a day (QID) | ORAL | Status: DC | PRN
  Administered 2015-11-14: 650 mg via ORAL
  Filled 2015-11-12: qty 2

## 2015-11-12 MED ORDER — GABAPENTIN 300 MG PO CAPS
300.0000 mg | ORAL_CAPSULE | Freq: Two times a day (BID) | ORAL | Status: DC
  Administered 2015-11-12: 300 mg via ORAL
  Filled 2015-11-12: qty 1

## 2015-11-12 NOTE — ED Notes (Signed)
Pt awake, ambulated to BR with standby assist, pt given breakfast and eaten 100%, psych MD and NP at bedside. Pt verbally responsive with clear speech.

## 2015-11-12 NOTE — Progress Notes (Signed)
Pt attended AA group this evening.  
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
## 2015-11-12 NOTE — ED Notes (Signed)
Autumn, Consulting civil engineer, spoke with [employer]. Poison Control stated to observe the patient for 6 hours with supportive management. And watch for blood pressure decreasing.

## 2015-11-12 NOTE — ED Notes (Signed)
Patient transported to CT 

## 2015-11-12 NOTE — ED Notes (Addendum)
Resting quietly with eye closed. Arousable. Verbally responsive. Speech slurred and cleared at times. Resp even and unlabored. ABC's intact. No behavior problems noted. NAD noted. Sitter at bedside. Personal items in bagged and at NS. Items include: pants, shirt, shorts, license, and shoes. Security wanded pt and personal belongings.

## 2015-11-12 NOTE — ED Notes (Addendum)
Patient continues to be drowsy. Patient wakes up briefly and falls back asleep. Patient stated one time during the night that he took the pills to kill himself. Sitter at bedside.

## 2015-11-12 NOTE — ED Notes (Addendum)
Patient went on walk with sitter.  Sitter reports he is getting better but still a bit wobbly.  Speech continues to improve.

## 2015-11-12 NOTE — Progress Notes (Addendum)
Disposition CSW confirmed with Fayrene Fearing, AOD at the [facility], that the hospital is on Diversion for [employer] and they are not taking referrals this weekend.  Pt is currently registered with VA services at the [facility].  CSW also contacted Ray, AOD of [facility], they are also currently full today.  Seward Speck [facility] Disposition CSW [phone]

## 2015-11-12 NOTE — ED Notes (Signed)
Pt transferred to Room 33 and belongings placed in locker 33

## 2015-11-12 NOTE — ED Notes (Signed)
TTS at bedside and pt reported that he was not able to cognitive answer questions and request TTS to return later.

## 2015-11-12 NOTE — Progress Notes (Signed)
Pt accepted to [facility] bed306-1.     Maryelizabeth Rowan, MSW, [facility] Triage Specialist [phone]

## 2015-11-12 NOTE — ED Notes (Signed)
MD at bedside. 

## 2015-11-12 NOTE — Progress Notes (Signed)
This writer attempted to assess the patient but because lethargy, slurred speech, disorganization, and unclear thought process the assessment was not completed.  Marylee Floras, RN agreed to discontinue the TTS order until a later time when the patient can appropriately participate in the assessment.      Maryelizabeth Rowan, MSW, [facility] Triage Specialist [phone]

## 2015-11-12 NOTE — ED Notes (Signed)
Patient resting with eyes closed. Vital signs within normal limits.

## 2015-11-12 NOTE — ED Notes (Signed)
Staffing notified of sitter need 

## 2015-11-12 NOTE — ED Notes (Addendum)
Pt presently is in the bed. He has a Comptroller at the bedside. Pt is for probable transfer to [facility] later this afternoon after social work hears back from the [facility] hospital about admission or decline for admission per NP. Pt does contract for safety . Will continue to monitor closely. He is alert and oriented times three. At times pts speech can be garbled and he forgets his train of thought mid sentence. Pt did state he and his GF were arrested Feb 4th and he just got out of jail - Pt stated his GF is still in jail. Pt denies trying to kill himself but stated he just felt anxious and drank and started taking seroquel. He stated, "I think I only took three."  He then told the writer he and his GF got into an argument but later recanted that story. Pt would like to go to [place] where he stated he has a house and the his GF's daughter and granddaughter live there. Pt is concerned that his fullterm disability will be revoked. Instructed the pt to discuss this with social work. Sitter remains at the bedside. A cold drink was offered. (4:05pm )Per social work pt is to go to [facility] since there is not bed availabilty at the [facility] hospital. Phoned [facility] at 4:25pm- Report to [place]. Per [place] Citizen she will phone back when the pt can go to [facility] room 306. 5:20pm Phoned [facility] . Nurse requested that the pt arrive at 6pm. Phoned [facility]. (5:20pm)[facility] will pick the pt up at 6pm. - Pt taken via [facility] to [facility]

## 2015-11-13 ENCOUNTER — Encounter (HOSPITAL_COMMUNITY): Payer: Self-pay | Admitting: Nurse Practitioner

## 2015-11-13 DIAGNOSIS — F333 Major depressive disorder, recurrent, severe with psychotic symptoms: Principal | ICD-10-CM

## 2015-11-13 MED ORDER — PNEUMOCOCCAL VAC POLYVALENT 25 MCG/0.5ML IJ INJ
0.5000 mL | INJECTION | INTRAMUSCULAR | Status: AC
  Administered 2015-11-14: 0.5 mL via INTRAMUSCULAR

## 2015-11-13 MED ORDER — SERTRALINE HCL 100 MG PO TABS
100.0000 mg | ORAL_TABLET | Freq: Every day | ORAL | Status: DC
  Administered 2015-11-13 – 2015-11-15 (×3): 100 mg via ORAL
  Filled 2015-11-13 (×6): qty 1
  Filled 2015-11-13: qty 7
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
## 2015-11-13 NOTE — Progress Notes (Signed)
Pt did not attend AA group this evening.  
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
## 2015-11-13 NOTE — Progress Notes (Signed)
Patient up and visible in milieu late morning. Indicates he wants to be allowed to rest to "sleep off the seroquel." Minimal interaction. Affect flat, anxious with congruent mood. Patient asking about discharge. Requested 72 RFD form. Minimizing events PTA. Rates his depression, anxiety and hopelessness all at a 2/10. Medicated per orders. Emotional support provided. 72 hour form signed and on shadow chart. Self inventory reviewed. He denies SI/HI and remains safe on level III obs. Steve [name]

## 2015-11-13 NOTE — [facility] Suicide Risk Assessment (Signed)
[facility] Adult Inpatient Family/Significant Other Suicide Prevention Education  Suicide Prevention Education:   Patient Refusal for Family/Significant Other Suicide Prevention Education: The patient has refused to provide written consent for family/significant other to be provided Family/Significant Other Suicide Prevention Education during admission and/or prior to discharge.  Physician notified.  CSW provided suicide prevention information with patient.    The suicide prevention education provided includes the following:  Suicide risk factors  Suicide prevention and interventions  National Suicide Hotline telephone number  [facility] assessment telephone number  [facility] Emergency Assistance 911  [facility] and/or Residential Mobile Crisis Unit telephone number   Reyes Ivan, [place] 11/13/2015 12:25 PM
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
## 2015-11-13 NOTE — B[facility] Group Notes (Signed)
BHH LCSW Group Therapy  11/13/2015 10:00 AM   Type of Therapy:  Group Therapy  Participation Level:  Did Not Attend  Reyes Ivan, LCSW 11/13/2015 2:20 PM

## 2015-11-13 NOTE — Tx Team (Signed)
Initial Interdisciplinary Treatment Plan   PATIENT STRESSORS: Financial difficulties Medication change or noncompliance Substance abuse   PATIENT STRENGTHS: Capable of independent living Motivation for treatment/growth Supportive family/friends   PROBLEM LIST: Problem List/Patient Goals Date to be addressed Date deferred Reason deferred Estimated date of resolution  "I need to get my freakin disability started" 11-13-2015     "I need to get into a PTSD or substance abuse residential treatment program" 11-13-2015     Depression  11-13-2015     Suicide Attempt 11-13-2015     Substance Abuse 11-13-2015                              DISCHARGE CRITERIA:  Ability to meet basic life and health needs Improved stabilization in mood, thinking, and/or behavior Motivation to continue treatment in a less acute level of care Verbal commitment to aftercare and medication compliance  PRELIMINARY DISCHARGE PLAN: Attend aftercare/continuing care group Outpatient therapy Return to previous living arrangement  PATIENT/FAMIILY INVOLVEMENT: This treatment plan has been presented to and reviewed with the patient, Steve [name]  The patient and family have been given the opportunity to ask questions and make suggestions.  Claiborne Rigg 11/13/2015, 12:53 AM

## 2015-11-13 NOTE — Progress Notes (Signed)
Patient requested and was provided with a 72 hour RFD form. Signed at 1200 and placed in shadow chart. Lawrence Marse[place]

## 2015-11-13 NOTE — Progress Notes (Signed)
Steve [name] who goes by Steve [name]" was observed to be resting in his bed upon my arrival to his room. During the assessment Steve [name] still appeared to be groggy with speech noted to be slurred and garbled. He was instructed to repeat his answers several times during the assessment. He currently is exhibiting thought blocking and reports difficulty concentrating. He rates Anxiety 10/10 Depression 9/10 Hopelessness 8/10. He endorses using marijuana but denies any recent cocaine use although his UDS results are contradictory to his report. He also states he does not drink alcohol very often however BAL was 70 on initial admission. He lives with a 76 year old girlfriend whom he reports is currently in jail for the next 60-90 days. They also live with his girlfriend's daughter who is 75 and his girlfriend's grandparent. He is currently unemployed. Steve [name] has a history of prior suicide attempts. States his father abuses cocaine and has a history of bipolar disorder. His mother has a history of depression.He endorses lack of finances to be a major stressor for him at this time.  He denies SI/HI/AVH. Encouragement and support given. Continue Q 15 minute checks for patient safety and medication effectiveness.

## 2015-11-14 MED ORDER — IBUPROFEN 400 MG PO TABS
ORAL_TABLET | ORAL | Status: AC
  Administered 2015-11-14: 800 mg
  Filled 2015-11-14: qty 2

## 2015-11-14 MED ORDER — IBUPROFEN 100 MG PO CHEW
800.0000 mg | CHEWABLE_TABLET | Freq: Once | ORAL | Status: DC
  Filled 2015-11-14: qty 8

## 2015-11-14 MED ORDER — DIPHENHYDRAMINE HCL 50 MG PO CAPS
50.0000 mg | ORAL_CAPSULE | Freq: Once | ORAL | Status: AC
  Administered 2015-11-14: 50 mg via ORAL
  Filled 2015-11-14: qty 1
  Filled 2015-11-14: qty 2

## 2015-11-14 MED ORDER — IBUPROFEN 800 MG PO TABS: 800.0000 mg | ORAL_TABLET | Freq: Once | ORAL | Status: AC
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
## 2015-11-14 NOTE — [facility] Group Notes (Signed)
[facility] LCSW Group Therapy  11/14/2015 1:14 PM  Type of Therapy:  Group Therapy  Participation Level:  Did Not Attend-pt invited. Chose to remain in bed.   Modes of Intervention:  Confrontation, Discussion, Education, Exploration, Problem-solving, Rapport Building, Socialization and Support  Summary of Progress/Problems: Today's Topic: Overcoming Obstacles. Patients identified one short term goal and potential obstacles in reaching this goal. Patients processed barriers involved in overcoming these obstacles. Patients identified steps necessary for overcoming these obstacles and explored motivation (internal and external) for facing these difficulties head on.   Smart, Raegan Sipp LCSW 11/14/2015, 1:14 PM

## 2015-11-14 NOTE — Tx Team (Signed)
Interdisciplinary Treatment Plan Update (Adult)  Date:  11/14/2015  Time Reviewed:  8:59 AM   Progress in Treatment: Attending groups: Yes. Participating in groups:  Yes. Taking medication as prescribed:  Yes. Tolerating medication:  Yes. Family/Significant othe contact made:  Pt refused to consent to family contact. SPE completed with pt.  Patient understands diagnosis:  Yes. and As evidenced by:  seeking treatment for substance abuse, depression, and medication stabilization. Discussing patient identified problems/goals with staff:  Yes. Medical problems stabilized or resolved:  Yes. Denies suicidal/homicidal ideation: Yes. Issues/concerns per patient self-inventory:  Other:  Discharge Plan or Barriers: Pt states that he lives in [place] but is from [place]. He reports that he is a veteran in connected to [place]. He is not interested in assistance with getting connected to local [facility]. CSW assessing for appropriate referrals. Pt focused on d/c Tuesday. "I have court Wednesday."   Reason for Continuation of Hospitalization: Depression Medication stabilization Withdrawal symptoms  Comments:  Steve [name] who goes by "Steve [name]" During the assessment Steve [name] still appeared to be groggy with speech noted to be slurred and garbled. He was instructed to repeat his answers several times during the assessment. He currently is exhibiting thought blocking and reports difficulty concentrating. He rates Anxiety 10/10 Depression 9/10 Hopelessness 8/10. He endorses using marijuana but denies any recent cocaine use although his UDS results are contradictory to his report. He also states he does not drink alcohol very often however BAL was 70 on initial admission. He lives with a 61 year old girlfriend whom he reports is currently in jail for the next 60-90 days. They also live with his girlfriend's daughter who is 68 and his girlfriend's grandparent. He is currently unemployed. [name] has a history of prior  suicide attempts. States his father abuses cocaine and has a history of bipolar disorder. His mother has a history of depression.He endorses lack of finances to be a major stressor for him at this time. He denies SI/HI/AVH.   Estimated length of stay:  1 day (tenative d/c scheduled for Tuesday)   New goal(s): to develop effective aftercare plan.   Additional Comments:  Patient and CSW reviewed pt's identified goals and treatment plan. Patient verbalized understanding and agreed to treatment plan. CSW reviewed [facility] "Discharge Process and Patient Involvement" Form. Pt verbalized understanding of information provided and signed form.    Review of initial/current patient goals per problem list:  1. Goal(s): Patient will participate in aftercare plan  Met: Goal progressing.   Target date: at discharge  As evidenced by: Patient will participate within aftercare plan AEB aftercare provider and housing plan at discharge being identified.  2/27: Pt states that he is VA connected but is hesitant to discuss aftercare at this time with CSW. CSW continuing to assess.   2. Goal (s): Patient will exhibit decreased depressive symptoms and suicidal ideations.  Met: Yes   Target date: at discharge  As evidenced by: Patient will utilize self rating of depression at 3 or below and demonstrate decreased signs of depression or be deemed stable for discharge by MD.  2/27: Pt rates depression as 2/10 and presents with pleasant mood/calm affect. Denies SI/HI/AVH.   3. Goal(s): Patient will demonstrate decreased signs of withdrawal due to substance abuse  Met:Yes  Target date:at discharge   As evidenced by: Patient will produce a CIWA/COWS score of 0, have stable vitals signs, and no symptoms of withdrawal.  2/27: Pt denies withdrawals today with CIWA score of 0 and stable  vitals.   Attendees: Patient:   11/14/2015 8:59 AM   Family:   11/14/2015 8:59 AM   Physician:  Dr. Carlton Adam, MD  11/14/2015 8:59 AM   Nursing:   Birdie Hopes RN 11/14/2015 8:59 AM   Clinical Social Worker: Maxie Better, LCSW 11/14/2015 8:59 AM   Clinical Social Worker:  11/14/2015 8:59 AM   Other:  Gerline Legacy Nurse Case Manager 11/14/2015 8:59 AM   Other:   11/14/2015 8:59 AM   Other:   11/14/2015 8:59 AM   Other:  11/14/2015 8:59 AM   Other:  11/14/2015 8:59 AM   Other:  11/14/2015 8:59 AM    11/14/2015 8:59 AM    11/14/2015 8:59 AM    11/14/2015 8:59 AM    11/14/2015 8:59 AM    Scribe for Treatment Team:   Maxie Better, LCSW 11/14/2015 8:59 AM

## 2015-11-14 NOTE — Plan of Care (Signed)
Problem: Alteration in mood Goal: STG-Patient reports thoughts of self-harm to staff Outcome: Progressing Denies SI, thoughts to self harm.  Problem: Diagnosis: Increased Risk For Suicide Attempt Goal: STG-Patient Will Comply With Medication Regime Outcome: Progressing Patient med compliant.

## 2015-11-14 NOTE — [facility] Group Notes (Signed)
Pt attended AA group.  Laury Huizar, MHT 

## 2015-11-14 NOTE — [facility] Group Notes (Signed)
[facility] LCSW Aftercare Discharge Planning Group Note   11/14/2015 11:37 AM  Participation Quality:  Appropriate   Mood/Affect:  Appropriate  Depression Rating:  2  Anxiety Rating:  2  Thoughts of Suicide:  No Will you contract for safety?   NA  Current AVH:  No  Plan for Discharge/Comments:  Pt reports that he plans to return home in [place] at d/c and is reluctant to discuss aftercare at this time, although he stated he is connected to the [place] in [place]. CSW assessing. Pt wants to d/c on Tuesday "I have court Wednesday."   Transportation Means: stepsister   Supports: gf, Engineer, manufacturing, friends   Smart, Conservation officer, nature

## 2015-11-14 NOTE — Progress Notes (Signed)
D    Pt isolated to his room most of the shift   He did come to the dayroom for snack but did not attend groups   He interacts minimally with others   He did not want any medications this evening and had no complaints A    Verbal support given   Medications offered    Q 15 min checks R   Pt safe at present time

## 2015-11-14 NOTE — Progress Notes (Signed)
D   Pt is more visible on the milieu this evening   He also attended group this evening and is socializing with select peers   Pt is bright and cooperative   Continues to have complaints of his arm hurting where he had his flu shot A    Verbal support given   Medications administered and effectiveness monitored  Q 15 min checks R   Pt is safe at present

## 2015-11-14 NOTE — Progress Notes (Signed)
Patient received pneumoccal vaccine in L deltoid this morning. Patient with increasing swelling at site in the afternoon and states he "worked out by doing push ups" shortly after IM admin. Ice pack provided along with tylenol however swelling continues. [name] NP notified and orders received. Will medicate per orders and monitor. Patient aware and verbalizes understanding. Steve [name]

## 2015-11-14 NOTE — Progress Notes (Signed)
Patient isolative to room this AM. Tends to keep to self. Will come up when prompted. Asked about OD PTA and patient states, "oh I was just drinking and didn't keep up with how much seroquel I took." Affect anxious with congruent mood. Asking about potential discharge. Rates his depression, hopelessness and anxiety all at a 2/10. Reports his goal is to eat healthy and exercise. Denies pain, physical problems. Medicated per orders, no side effects reported. Emotional support offered. Self inventory reviewed. He denies SI/HI and remains safe on level III obs. Steve [name]

## 2015-11-15 MED ORDER — BUSPIRONE HCL 10 MG PO TABS: 10.0000 mg | ORAL_TABLET | Freq: Three times a day (TID) | ORAL | Status: DC

## 2015-11-15 MED ORDER — PRAZOSIN HCL 2 MG PO CAPS: 2.0000 mg | ORAL_CAPSULE | Freq: Every day | ORAL | Status: DC

## 2015-11-15 MED ORDER — GABAPENTIN 300 MG PO CAPS: 300.0000 mg | ORAL_CAPSULE | Freq: Two times a day (BID) | ORAL | Status: DC

## 2015-11-15 MED ORDER — DIVALPROEX SODIUM ER 500 MG PO TB24: 500.0000 mg | ORAL_TABLET | Freq: Every day | ORAL | Status: DC

## 2015-11-15 MED ORDER — NICOTINE 21 MG/24HR TD PT24: 21.0000 mg | MEDICATED_PATCH | Freq: Every day | TRANSDERMAL | Status: DC

## 2015-11-15 MED ORDER — SERTRALINE HCL 100 MG PO TABS: 100.0000 mg | ORAL_TABLET | Freq: Every day | ORAL | Status: DC

## 2015-11-15 NOTE — Tx Team (Signed)
Interdisciplinary Treatment Plan Update (Adult)  Date:  11/15/2015  Time Reviewed:  10:06 AM   Progress in Treatment: Attending groups: Yes. Participating in groups:  Yes. Taking medication as prescribed:  Yes. Tolerating medication:  Yes. Family/Significant othe contact made:  Pt refused to consent to family contact. SPE completed with pt.  Patient understands diagnosis:  Yes. and As evidenced by:  seeking treatment for substance abuse, depression, and medication stabilization. Discussing patient identified problems/goals with staff:  Yes. Medical problems stabilized or resolved:  Yes. Denies suicidal/homicidal ideation: Yes. Issues/concerns per patient self-inventory:  Other:  Discharge Plan or Barriers: Pt plans to return home in [place]; follow-up at [facility]. Pt plans to go to outpatient [facility] on his own and did not want appt scheduled for him through any [place] [facility] clinics.   Reason for Continuation of Hospitalization: none  Comments:  Loyal who goes by [facility]" During the assessment Steve [name] still appeared to be groggy with speech noted to be slurred and garbled. He was instructed to repeat his answers several times during the assessment. He currently is exhibiting thought blocking and reports difficulty concentrating. He rates Anxiety 10/10 Depression 9/10 Hopelessness 8/10. He endorses using marijuana but denies any recent cocaine use although his UDS results are contradictory to his report. He also states he does not drink alcohol very often however BAL was 70 on initial admission. He lives with a 39 year old girlfriend whom he reports is currently in jail for the next 60-90 days. They also live with his girlfriend's daughter who is 30 and his girlfriend's grandparent. He is currently unemployed. Steve [name] has a history of prior suicide attempts. States his father abuses cocaine and has a history of bipolar disorder. His mother has a history of depression.He endorses lack of finances to be a  major stressor for him at this time. He denies SI/HI/AVH.   Estimated length of stay:  D/c today  New goal(s): to develop effective aftercare plan.   Additional Comments:  Patient and CSW reviewed pt's identified goals and treatment plan. Patient verbalized understanding and agreed to treatment plan. CSW reviewed [facility] "Discharge Process and Patient Involvement" Form. Pt verbalized understanding of information provided and signed form.    Review of initial/current patient goals per problem list:  1. Goal(s): Patient will participate in aftercare plan  Met: Yes  Target date: at discharge  As evidenced by: Patient will participate within aftercare plan AEB aftercare provider and housing plan at discharge being identified.  2/27: Pt states that he is VA connected but is hesitant to discuss aftercare at this time with CSW. CSW continuing to assess.   2/28: Pt agreeable to [facility] referral, as he declined to consent to [facility] referral in [place]. Pt connected to [place].  2. Goal (s): Patient will exhibit decreased depressive symptoms and suicidal ideations.  Met: Yes   Target date: at discharge  As evidenced by: Patient will utilize self rating of depression at 3 or below and demonstrate decreased signs of depression or be deemed stable for discharge by MD.  2/27: Pt rates depression as 2/10 and presents with pleasant mood/calm affect. Denies SI/HI/AVH.   3. Goal(s): Patient will demonstrate decreased signs of withdrawal due to substance abuse  Met:Yes  Target date:at discharge   As evidenced by: Patient will produce a CIWA/COWS score of 0, have stable vitals signs, and no symptoms of withdrawal.  2/27: Pt denies withdrawals today with CIWA score of 0 and stable vitals.   Attendees: Patient:  11/15/2015 10:06 AM   Family:   11/15/2015 10:06 AM   Physician:  Dr. Carlton Adam, MD 11/15/2015 10:06 AM   Nursing:   Phillip Heal RN 11/15/2015 10:06 AM   Clinical Social Worker:  Maxie Better, LCSW 11/15/2015 10:06 AM   Clinical Social Worker:  11/15/2015 10:06 AM   Other:  Gerline Legacy Nurse Case Manager 11/15/2015 10:06 AM   Other:   11/15/2015 10:06 AM   Other:   11/15/2015 10:06 AM   Other:  11/15/2015 10:06 AM   Other:  11/15/2015 10:06 AM   Other:  11/15/2015 10:06 AM    11/15/2015 10:06 AM    11/15/2015 10:06 AM    11/15/2015 10:06 AM    11/15/2015 10:06 AM    Scribe for Treatment Team:   Maxie Better, LCSW 11/15/2015 10:06 AM

## 2015-11-15 NOTE — Progress Notes (Signed)
  [facility] Adult Case Management Discharge Plan :  Will you be returning to the same living situation after discharge:  Yes,  home At discharge, do you have transportation home?: Yes,  friend/stepsister Do you have the ability to pay for your medications: Yes,  VA connected. mental health.  Release of information consent forms completed and submitted to medical records by CSW.  Patient to Follow up at: Follow-up Information    Follow up with [facility].   Why:  Walk in between 8am-9am Monday through Friday for hospital follow-up/medication management/assessment for counseling services.    Contact information:   [address] [place] [address] Phone: [phone] Fax: [phone]      Next level of care provider has access to [facility] Link:no  Safety Planning and Suicide Prevention discussed: Yes,  SPE completed with pt, as he declined to consent to family contact.  Have you used any form of tobacco in the last 30 days? (Cigarettes, Smokeless Tobacco, Cigars, and/or Pipes): Yes  Has patient been referred to the Quitline?: Patient refused referral  Patient has been referred for addiction treatment: Pt. refused referral-pt also refused to consent to follow-up through the [place].   Smart, Tri Chittick LCSW 11/15/2015, 10:05 AM

## 2015-11-15 NOTE — Progress Notes (Signed)
Pt discharged home with girl friend sister. Pt was ambulatory, stable and appreciative at that time. All papers and prescriptions were given and valuables returned. Verbal understanding expressed. Denies SI/HI and A/VH. Pt given opportunity to express concerns and ask questions.
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
## 2015-11-15 NOTE — Plan of Care (Signed)
Problem: Alteration in mood Goal: LTG-Patient reports reduction in suicidal thoughts (Patient reports reduction in suicidal thoughts and is able to verbalize a safety plan for whenever patient is feeling suicidal)  Outcome: Adequate for Discharge Pt denies suicidal ideation  Problem: Ineffective individual coping Goal: LTG: Patient will report a decrease in negative feelings Outcome: Adequate for Discharge Pt making positive statements

## 2016-04-23 ENCOUNTER — Emergency Department (HOSPITAL_COMMUNITY)
Admission: EM | Admit: 2016-04-23 | Discharge: 2016-04-25 | Disposition: A | Discharge status: Home / Self Care | Payer: Non-veteran care | Attending: Emergency Medicine | Admitting: Emergency Medicine

## 2016-04-23 ENCOUNTER — Encounter (HOSPITAL_COMMUNITY): Payer: Self-pay | Admitting: Emergency Medicine

## 2016-04-23 DIAGNOSIS — F15951 Other stimulant use, unspecified with stimulant-induced psychotic disorder with hallucinations: Secondary | ICD-10-CM | POA: Diagnosis present

## 2016-04-23 DIAGNOSIS — F309 Manic episode, unspecified: Secondary | ICD-10-CM | POA: Insufficient documentation

## 2016-04-23 DIAGNOSIS — F29 Unspecified psychosis not due to a substance or known physiological condition: Secondary | ICD-10-CM | POA: Insufficient documentation

## 2016-04-23 DIAGNOSIS — R000 Tachycardia, unspecified: Secondary | ICD-10-CM | POA: Insufficient documentation

## 2016-04-23 DIAGNOSIS — F333 Major depressive disorder, recurrent, severe with psychotic symptoms: Secondary | ICD-10-CM | POA: Diagnosis present

## 2016-04-23 DIAGNOSIS — F17210 Nicotine dependence, cigarettes, uncomplicated: Secondary | ICD-10-CM | POA: Insufficient documentation

## 2016-04-23 DIAGNOSIS — F4310 Post-traumatic stress disorder, unspecified: Secondary | ICD-10-CM

## 2016-04-23 DIAGNOSIS — F1290 Cannabis use, unspecified, uncomplicated: Secondary | ICD-10-CM | POA: Insufficient documentation

## 2016-04-23 DIAGNOSIS — Z79899 Other long term (current) drug therapy: Secondary | ICD-10-CM | POA: Insufficient documentation

## 2016-04-23 LAB — COMPREHENSIVE METABOLIC PANEL
ALBUMIN: 4.5 g/dL (ref 3.5–5.0)
ALT: 24 U/L (ref 17–63)
AST: 34 U/L (ref 15–41)
Alkaline Phosphatase: 46 U/L (ref 38–126)
Anion gap: 9 (ref 5–15)
BUN: 22 mg/dL — AB (ref 6–20)
CALCIUM: 9.1 mg/dL (ref 8.9–10.3)
CO2: 22 mmol/L (ref 22–32)
CREATININE: 1.11 mg/dL (ref 0.61–1.24)
Chloride: 108 mmol/L (ref 101–111)
GFR calc Af Amer: >60 mL/min (ref >60)
GFR calc non Af Amer: >60 mL/min (ref >60)
Glucose, Bld: 78 mg/dL (ref 65–99)
Potassium: 3.7 mmol/L (ref 3.5–5.1)
SODIUM: 139 mmol/L (ref 135–145)
TOTAL PROTEIN: 7 g/dL (ref 6.5–8.1)
Total Bilirubin: 1.8 mg/dL — ABNORMAL HIGH (ref 0.3–1.2)

## 2016-04-23 LAB — CBC
HCT: 33.7 % — ABNORMAL LOW (ref 39.0–52.0)
Hemoglobin: 12.1 g/dL — ABNORMAL LOW (ref 13.0–17.0)
MCH: 31.8 pg (ref 26.0–34.0)
MCHC: 35.9 g/dL (ref 30.0–36.0)
MCV: 88.5 fL (ref 78.0–100.0)
PLATELETS: 168 10*3/uL (ref 150–400)
RBC: 3.81 MIL/uL — ABNORMAL LOW (ref 4.22–5.81)
RDW: 12.9 % (ref 11.5–15.5)
WBC: 4.9 10*3/uL (ref 4.0–10.5)

## 2016-04-23 LAB — SALICYLATE LEVEL: Salicylate Lvl: <4 mg/dL (ref 2.8–30.0)

## 2016-04-23 LAB — ACETAMINOPHEN LEVEL: Acetaminophen (Tylenol), Serum: <10 ug/mL — ABNORMAL LOW (ref 10–30)

## 2016-04-23 LAB — ETHANOL

## 2016-04-23 LAB — CK: Total CK: 501 U/L — ABNORMAL HIGH (ref 49–397)

## 2016-04-23 MED ORDER — ZIPRASIDONE MESYLATE 20 MG IM SOLR
20.0000 mg | Freq: Once | INTRAMUSCULAR | Status: AC
  Administered 2016-04-23: 20 mg via INTRAMUSCULAR
  Filled 2016-04-23: qty 20

## 2016-04-23 MED ORDER — STERILE WATER FOR INJECTION IJ SOLN
INTRAMUSCULAR | Status: AC
  Administered 2016-04-23: 19:00:00
  Filled 2016-04-23: qty 10

## 2016-04-23 NOTE — ED Triage Notes (Signed)
Pt brought in by [employer]. Pt was running around in traffic. Pt speaking gibberish in triage. Pt very manic, spun in circles on his way to triage room and pretending to kickbox.
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
## 2016-04-23 NOTE — Progress Notes (Signed)
Patient brought in by [employer].  Patient running into traffic exhibiting.  Manic behaviors. Per chart review, patient listed as having Veteran's benefits, [facility] [place]. Patient with pmhx of overdose, PTSD, paranoid schizophrenia, anxiety, depression. Patient with history of inpatient psychiatric treatment. Patient not able to participate in [facility] assessment at this time due to mental status.

## 2016-04-24 ENCOUNTER — Encounter (HOSPITAL_COMMUNITY): Payer: Self-pay | Admitting: Emergency Medicine

## 2016-04-24 LAB — RAPID URINE DRUG SCREEN, HOSP PERFORMED
AMPHETAMINES: POSITIVE — AB
BENZODIAZEPINES: NOT DETECTED
Barbiturates: NOT DETECTED
Cocaine: NOT DETECTED
OPIATES: NOT DETECTED
TETRAHYDROCANNABINOL: POSITIVE — AB

## 2016-04-24 MED ORDER — ACETAMINOPHEN 325 MG PO TABS: 650.0000 mg | ORAL_TABLET | ORAL | Status: DC | PRN

## 2016-04-24 MED ORDER — LORAZEPAM 1 MG PO TABS: 1.0000 mg | ORAL_TABLET | Freq: Three times a day (TID) | ORAL | Status: DC | PRN

## 2016-04-24 NOTE — ED Notes (Signed)
When pt arrived to the [facility] he asked for a snack and some juice. His speech is intelligible, his behavior is appropriate. He admits to using methamphetamine and marijuana, and he said that he continues to hear voices. He ate his snack and went to bed.

## 2016-04-24 NOTE — ED Notes (Signed)
[facility] Expected date: 04/24/16 Expected time:  Means of arrival:  Comments: Hold for 17

## 2016-04-24 NOTE — Progress Notes (Signed)
04/24/16  Pt was asleep, did not participate.   Caroll RancherMarjette Destenee Guerry, LRT/CTRS

## 2016-04-24 NOTE — ED Notes (Signed)
TTS consult being done at bedside.
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
## 2016-04-25 DIAGNOSIS — F333 Major depressive disorder, recurrent, severe with psychotic symptoms: Secondary | ICD-10-CM

## 2016-04-25 DIAGNOSIS — F4310 Post-traumatic stress disorder, unspecified: Secondary | ICD-10-CM

## 2016-04-25 DIAGNOSIS — F15951 Other stimulant use, unspecified with stimulant-induced psychotic disorder with hallucinations: Secondary | ICD-10-CM | POA: Diagnosis present

## 2016-04-25 HISTORY — DX: Other stimulant use, unspecified with stimulant-induced psychotic disorder with hallucinations: F15.951

## 2016-04-25 MED ORDER — RISPERIDONE 1 MG PO TABS
1.0000 mg | ORAL_TABLET | Freq: Two times a day (BID) | ORAL | Status: DC
  Administered 2016-04-25: 1 mg via ORAL
  Filled 2016-04-25: qty 1

## 2016-04-25 NOTE — ED Notes (Signed)
IVC papers placed on patient's chart.

## 2016-04-25 NOTE — ED Notes (Signed)
Pt discharged ambulatory with resources.  Pt was in no distress at discharge.  All belongings were returned to pt.
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
## 2016-04-25 NOTE — Discharge Instructions (Signed)
For your ongoing behavioral health needs, you are advised to follow up with the [facility]:       [facility]      [address]      [place], [place] [address]      [phone]
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
## 2016-11-02 ENCOUNTER — Emergency Department (HOSPITAL_COMMUNITY)
Admission: EM | Admit: 2016-11-02 | Discharge: 2016-11-04 | Disposition: A | Discharge status: Home / Self Care | Payer: Non-veteran care | Attending: Emergency Medicine | Admitting: Emergency Medicine

## 2016-11-02 ENCOUNTER — Encounter (HOSPITAL_COMMUNITY): Payer: Self-pay | Admitting: Emergency Medicine

## 2016-11-02 DIAGNOSIS — R945 Abnormal results of liver function studies: Secondary | ICD-10-CM | POA: Insufficient documentation

## 2016-11-02 DIAGNOSIS — R7989 Other specified abnormal findings of blood chemistry: Secondary | ICD-10-CM

## 2016-11-02 DIAGNOSIS — R17 Unspecified jaundice: Secondary | ICD-10-CM

## 2016-11-02 DIAGNOSIS — F17210 Nicotine dependence, cigarettes, uncomplicated: Secondary | ICD-10-CM | POA: Insufficient documentation

## 2016-11-02 DIAGNOSIS — Z79899 Other long term (current) drug therapy: Secondary | ICD-10-CM | POA: Insufficient documentation

## 2016-11-02 DIAGNOSIS — F29 Unspecified psychosis not due to a substance or known physiological condition: Secondary | ICD-10-CM

## 2016-11-02 DIAGNOSIS — F1994 Other psychoactive substance use, unspecified with psychoactive substance-induced mood disorder: Secondary | ICD-10-CM

## 2016-11-02 DIAGNOSIS — F1914 Other psychoactive substance abuse with psychoactive substance-induced mood disorder: Secondary | ICD-10-CM | POA: Insufficient documentation

## 2016-11-02 DIAGNOSIS — K759 Inflammatory liver disease, unspecified: Secondary | ICD-10-CM

## 2016-11-02 LAB — COMPREHENSIVE METABOLIC PANEL
ALBUMIN: 4.6 g/dL (ref 3.5–5.0)
ALK PHOS: 98 U/L (ref 38–126)
ALT: 592 U/L — ABNORMAL HIGH (ref 17–63)
ANION GAP: 11 (ref 5–15)
AST: 332 U/L — ABNORMAL HIGH (ref 15–41)
BUN: 17 mg/dL (ref 6–20)
CALCIUM: 9.7 mg/dL (ref 8.9–10.3)
CO2: 26 mmol/L (ref 22–32)
Chloride: 103 mmol/L (ref 101–111)
Creatinine, Ser: 1.29 mg/dL — ABNORMAL HIGH (ref 0.61–1.24)
GFR calc non Af Amer: >60 mL/min (ref >60)
GLUCOSE: 95 mg/dL (ref 65–99)
Potassium: 4.5 mmol/L (ref 3.5–5.1)
Sodium: 140 mmol/L (ref 135–145)
TOTAL PROTEIN: 7.8 g/dL (ref 6.5–8.1)
Total Bilirubin: 3.8 mg/dL — ABNORMAL HIGH (ref 0.3–1.2)

## 2016-11-02 LAB — CBC WITH DIFFERENTIAL/PLATELET
BASOS PCT: 0 %
Basophils Absolute: 0 10*3/uL (ref 0.0–0.1)
EOS ABS: 0 10*3/uL (ref 0.0–0.7)
Eosinophils Relative: 0 %
HCT: 43.1 % (ref 39.0–52.0)
HEMOGLOBIN: 15.4 g/dL (ref 13.0–17.0)
Lymphocytes Relative: 25 %
Lymphs Abs: 1.8 10*3/uL (ref 0.7–4.0)
MCH: 31.8 pg (ref 26.0–34.0)
MCHC: 35.7 g/dL (ref 30.0–36.0)
MCV: 88.9 fL (ref 78.0–100.0)
MONOS PCT: 11 %
Monocytes Absolute: 0.8 10*3/uL (ref 0.1–1.0)
NEUTROS PCT: 64 %
Neutro Abs: 4.7 10*3/uL (ref 1.7–7.7)
PLATELETS: 239 10*3/uL (ref 150–400)
RBC: 4.85 MIL/uL (ref 4.22–5.81)
RDW: 12.6 % (ref 11.5–15.5)
WBC: 7.3 10*3/uL (ref 4.0–10.5)

## 2016-11-02 LAB — ETHANOL: Alcohol, Ethyl (B): <5 mg/dL (ref <5)

## 2016-11-02 LAB — VALPROIC ACID LEVEL

## 2016-11-02 MED ORDER — ZIPRASIDONE MESYLATE 20 MG IM SOLR
20.0000 mg | Freq: Once | INTRAMUSCULAR | Status: AC
  Administered 2016-11-02: 20 mg via INTRAMUSCULAR
  Filled 2016-11-02: qty 20

## 2016-11-02 MED ORDER — STERILE WATER FOR INJECTION IJ SOLN
INTRAMUSCULAR | Status: AC
  Administered 2016-11-02: 1.2 mL
  Filled 2016-11-02: qty 10
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
## 2016-11-02 NOTE — ED Notes (Signed)
Bed: WA04 Expected date:  Expected time:  Means of arrival:  Comments: Hold

## 2016-11-02 NOTE — ED Notes (Signed)
Pt seems to be "role playing" different people, [employer] at bedside

## 2016-11-02 NOTE — ED Notes (Signed)
ED Provider at bedside. Dr. Salina AprilNanavatti; edp is going to IVC patient for safetyl

## 2016-11-02 NOTE — ED Notes (Signed)
Denies any need to use the bathroom.  Will let us know when he needs to go.

## 2016-11-02 NOTE — ED Notes (Signed)
Per [employer], pt is not making sense; emotionally up and down; hx of using meth

## 2016-11-02 NOTE — ED Notes (Signed)
Pt exhibiting agitation and anxiety.  Pacing in room, attempting to walk to end of hall, talking to self in multiple voices.  Md alerted.

## 2016-11-02 NOTE — ED Notes (Signed)
Received bedside report from Amanda RN 

## 2016-11-03 ENCOUNTER — Emergency Department (HOSPITAL_COMMUNITY): Payer: Self-pay

## 2016-11-03 DIAGNOSIS — K759 Inflammatory liver disease, unspecified: Secondary | ICD-10-CM | POA: Diagnosis not present

## 2016-11-03 DIAGNOSIS — Z818 Family history of other mental and behavioral disorders: Secondary | ICD-10-CM | POA: Diagnosis not present

## 2016-11-03 DIAGNOSIS — F17210 Nicotine dependence, cigarettes, uncomplicated: Secondary | ICD-10-CM | POA: Diagnosis not present

## 2016-11-03 DIAGNOSIS — F1290 Cannabis use, unspecified, uncomplicated: Secondary | ICD-10-CM | POA: Diagnosis not present

## 2016-11-03 DIAGNOSIS — F1994 Other psychoactive substance use, unspecified with psychoactive substance-induced mood disorder: Secondary | ICD-10-CM | POA: Diagnosis not present

## 2016-11-03 LAB — HEPATIC FUNCTION PANEL
ALBUMIN: 3.8 g/dL (ref 3.5–5.0)
ALK PHOS: 88 U/L (ref 38–126)
ALT: 474 U/L — ABNORMAL HIGH (ref 17–63)
AST: 270 U/L — AB (ref 15–41)
BILIRUBIN TOTAL: 2.8 mg/dL — AB (ref 0.3–1.2)
Bilirubin, Direct: 1 mg/dL — ABNORMAL HIGH (ref 0.1–0.5)
Indirect Bilirubin: 1.8 mg/dL — ABNORMAL HIGH (ref 0.3–0.9)
Total Protein: 6.6 g/dL (ref 6.5–8.1)

## 2016-11-03 LAB — RAPID URINE DRUG SCREEN, HOSP PERFORMED
Amphetamines: POSITIVE — AB
Barbiturates: NOT DETECTED
Benzodiazepines: NOT DETECTED
COCAINE: POSITIVE — AB
OPIATES: NOT DETECTED
TETRAHYDROCANNABINOL: POSITIVE — AB

## 2016-11-03 LAB — ACETAMINOPHEN LEVEL: Acetaminophen (Tylenol), Serum: <10 ug/mL — ABNORMAL LOW (ref 10–30)
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
## 2016-11-03 NOTE — ED Notes (Addendum)
Pt awake and ate lunch. Pt endorsing SI. Denies HI. Endorsing AVH. Pt reports "I had a flashback, I started tripping out." is what brought him to the hospital.  Pt reports meth and marijuana use. Encouragement and support provided. Special checks q 15 mins in place for safety. Video monitoring in place. Will continue to monitor.
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
## 2016-11-03 NOTE — BH Assessment (Signed)
BHH Assessment Progress Note  Case was staffed with Jonnlagadda MD who recommended patient be re-evaluated in the a.m.

## 2016-11-03 NOTE — ED Notes (Signed)
SBAR Report received from previous nurse. Pt received calm andasleep on unit. Pt gave no meaningful answers to assessment questions related to  current SI/ HI, A/V H, depression, anxiety, or pain at this time, and appears otherwise stable and free of distress. Pt reminded of camera surveillance, q 15 min rounds, and rules of the milieu. Will continue to assess.

## 2016-11-03 NOTE — ED Notes (Signed)
Pt admitted to room #42, IVC.Pt is currently sleeping. No s/s of distress noted, breathing non labored, respirations equal. Special checks q 15 mins in place for safety. Video monitoring in place. Will continue to monitor.

## 2016-11-04 DIAGNOSIS — Z79899 Other long term (current) drug therapy: Secondary | ICD-10-CM

## 2016-11-04 DIAGNOSIS — F1994 Other psychoactive substance use, unspecified with psychoactive substance-induced mood disorder: Secondary | ICD-10-CM

## 2016-11-04 DIAGNOSIS — Z818 Family history of other mental and behavioral disorders: Secondary | ICD-10-CM

## 2016-11-04 DIAGNOSIS — F17210 Nicotine dependence, cigarettes, uncomplicated: Secondary | ICD-10-CM | POA: Diagnosis not present

## 2016-11-04 DIAGNOSIS — F1290 Cannabis use, unspecified, uncomplicated: Secondary | ICD-10-CM

## 2016-11-04 DIAGNOSIS — Z882 Allergy status to sulfonamides status: Secondary | ICD-10-CM

## 2016-11-04 LAB — COMPREHENSIVE METABOLIC PANEL
ALBUMIN: 3.7 g/dL (ref 3.5–5.0)
ALT: 524 U/L — ABNORMAL HIGH (ref 17–63)
ANION GAP: 6 (ref 5–15)
AST: 321 U/L — ABNORMAL HIGH (ref 15–41)
Alkaline Phosphatase: 92 U/L (ref 38–126)
BILIRUBIN TOTAL: 2.3 mg/dL — AB (ref 0.3–1.2)
BUN: 14 mg/dL (ref 6–20)
CHLORIDE: 104 mmol/L (ref 101–111)
CO2: 27 mmol/L (ref 22–32)
Calcium: 8.9 mg/dL (ref 8.9–10.3)
Creatinine, Ser: 1.07 mg/dL (ref 0.61–1.24)
GFR calc Af Amer: >60 mL/min (ref >60)
GFR calc non Af Amer: >60 mL/min (ref >60)
GLUCOSE: 84 mg/dL (ref 65–99)
POTASSIUM: 4.1 mmol/L (ref 3.5–5.1)
SODIUM: 137 mmol/L (ref 135–145)
TOTAL PROTEIN: 6.6 g/dL (ref 6.5–8.1)

## 2016-11-04 LAB — PROTIME-INR
INR: 0.98
Prothrombin Time: 12.9 seconds (ref 11.4–15.2)

## 2016-11-04 MED ORDER — TRAZODONE HCL 50 MG PO TABS: 50.0000 mg | ORAL_TABLET | Freq: Every evening | ORAL | 0 refills | Status: DC | PRN

## 2016-11-04 MED ORDER — BUSPIRONE HCL 10 MG PO TABS: 10.0000 mg | ORAL_TABLET | Freq: Three times a day (TID) | ORAL | Status: DC

## 2016-11-04 MED ORDER — SERTRALINE HCL 50 MG PO TABS
100.0000 mg | ORAL_TABLET | Freq: Every day | ORAL | Status: DC
  Filled 2016-11-04: qty 2

## 2016-11-04 MED ORDER — TRAZODONE HCL 50 MG PO TABS: 50.0000 mg | ORAL_TABLET | Freq: Every evening | ORAL | Status: DC | PRN

## 2016-11-04 MED ORDER — BUSPIRONE HCL 10 MG PO TABS: 10.0000 mg | ORAL_TABLET | Freq: Three times a day (TID) | ORAL | 0 refills | Status: DC

## 2016-11-04 MED ORDER — PRAZOSIN HCL 2 MG PO CAPS: 2.0000 mg | ORAL_CAPSULE | Freq: Every day | ORAL | Status: DC

## 2016-11-04 MED ORDER — PRAZOSIN HCL 2 MG PO CAPS: 2.0000 mg | ORAL_CAPSULE | Freq: Every day | ORAL | 0 refills | Status: DC

## 2016-11-04 MED ORDER — SERTRALINE HCL 100 MG PO TABS: 100.0000 mg | ORAL_TABLET | Freq: Every day | ORAL | 0 refills | Status: DC

## 2016-11-04 NOTE — Progress Notes (Signed)
CSW filed patient's Notice of Commitment Change paperwork into IVC logbook.

## 2016-11-04 NOTE — ED Notes (Signed)
Lab called for AM draw.
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
## 2016-11-04 NOTE — Progress Notes (Signed)
11/04/2016 1:02 PM  I spoke with Dr. Link SnufferHobson and reviewed patient's LFTs with her.  I think that it is reasonable to have patient follow up with PCP outpatient to have LFTs rechecked in next week.  Pt had hepatitis labs drawn and are pending at this time.  [facility] and should folllow up results of hepatitis testing with PCP.  I think he can be safely discharged because he does have outpatient follow up for his LFTs.    Maryln Manuel. Ezell Melikian, MD
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
## 2016-11-04 NOTE — Discharge Instructions (Signed)
Abstain from use of illicit substances (crystal meth, marijuana, cocaine, heroin, etc.). Take medications as prescribed. You have been given a 30 day supply of your medications (Buspar, Prazosin, Zoloft and Trazodone). You [facility] to obtain [facility]ills and continued medication management. It is important you follow up with [facility] as well as your aspartate aminotransferase (AST) and alanine aminotransferase (ALT) (liver function tests) were elevated at 321 and 524 respectively. These elevations could indicate an underlying problem with your liver.   [facility] [address] [place], [place] [phone]   [phone]

## 2016-11-05 LAB — HIV ANTIBODY (ROUTINE TESTING W REFLEX): HIV Screen 4th Generation wRfx: NONREACTIVE

## 2016-11-08 LAB — HEPATITIS PANEL, ACUTE
Hep A IgM: NEGATIVE
Hep B C IgM: NEGATIVE
Hepatitis B Surface Ag: NEGATIVE

## 2017-02-18 IMAGING — CT CT HEAD W/O CM
2 series · 16 of 30 positions shown, 20 images · non-contrast
Comparison: 02/27/2014

CLINICAL DATA: 24-year-old male with altered mental status and
lethargy.

EXAM:
CT HEAD WITHOUT CONTRAST
TECHNIQUE: Contiguous axial images were obtained from the base of the skull
through the vertex without intravenous contrast.

[Series 2: head w/o · axial · non-contrast · 0.45mm/px · z∈[-142,-22]mm · 13 of 29 slices shown, 17 images]
[im 3/29  brain]
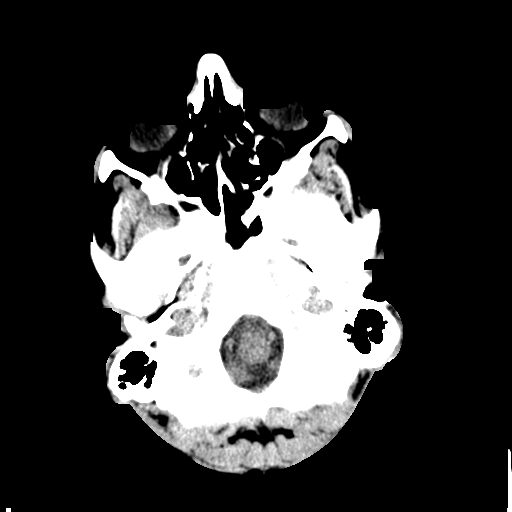
[im 3/29  bone]
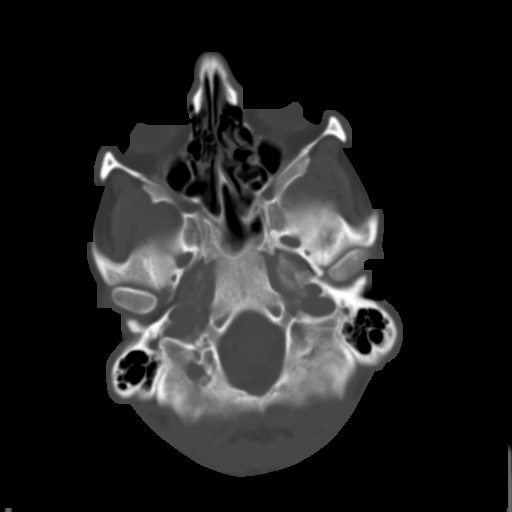
[im 5/29  brain]
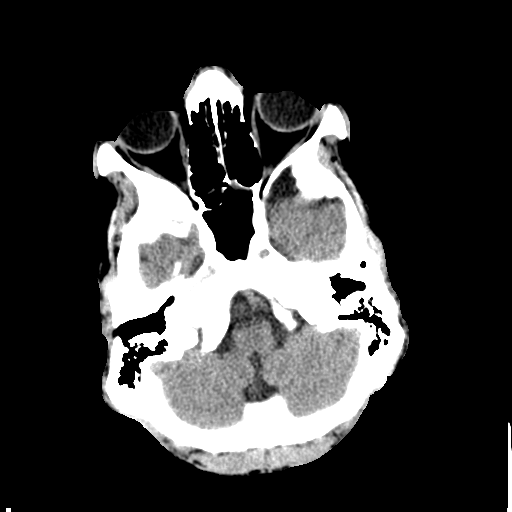
[im 7/29  brain]
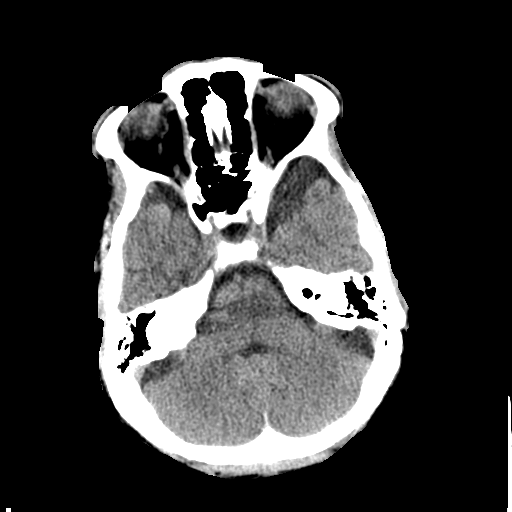
[im 9/29  brain]
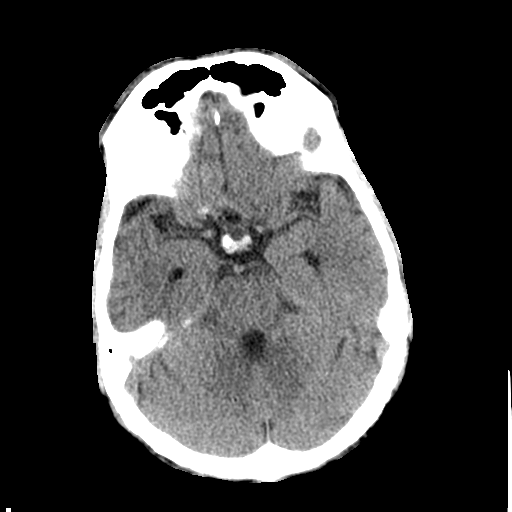
[im 11/29  brain]
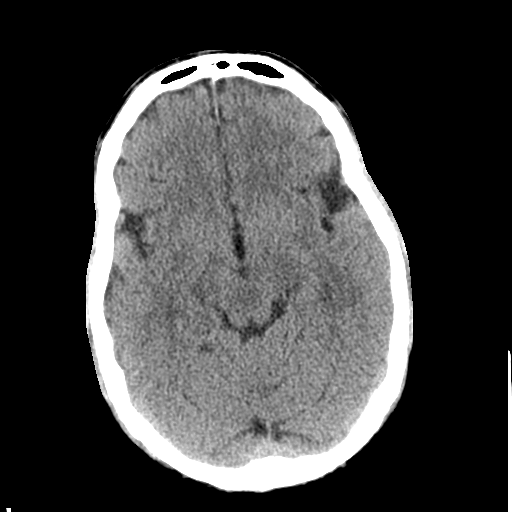
[im 11/29  bone]
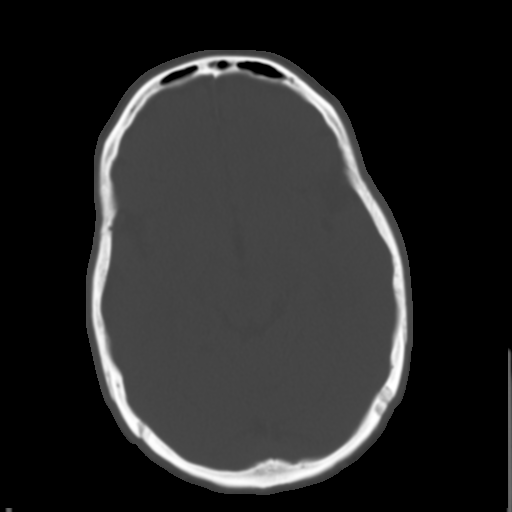
[im 13/29  brain]
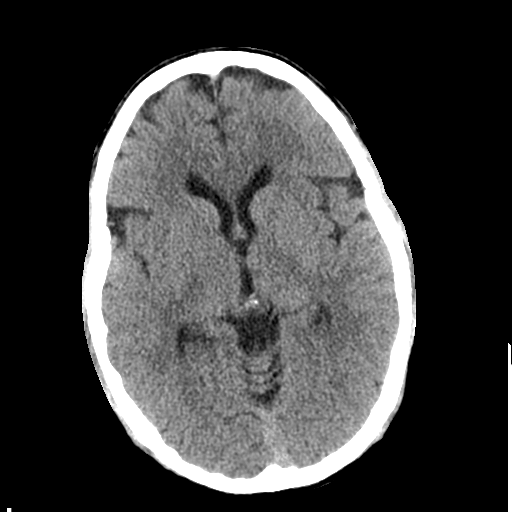
[im 15/29  brain]
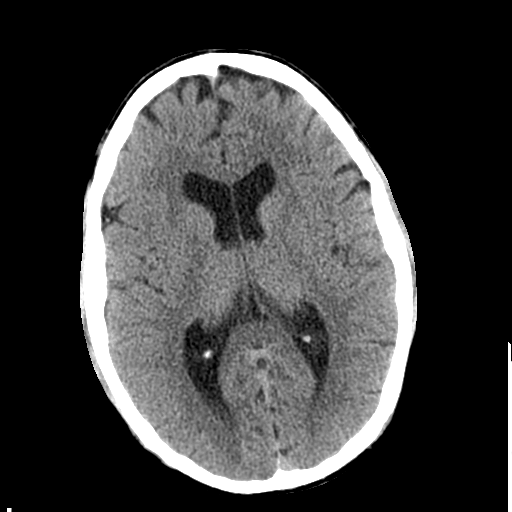
[im 17/29  brain]
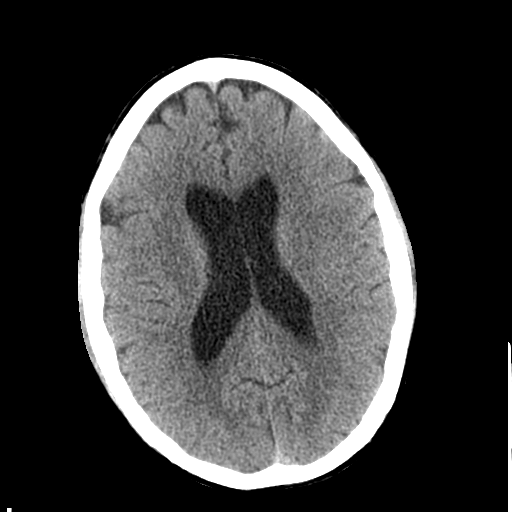
[im 19/29  brain]
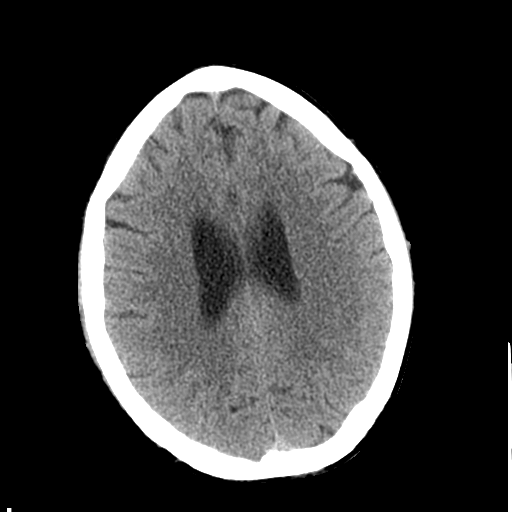
[im 19/29  bone]
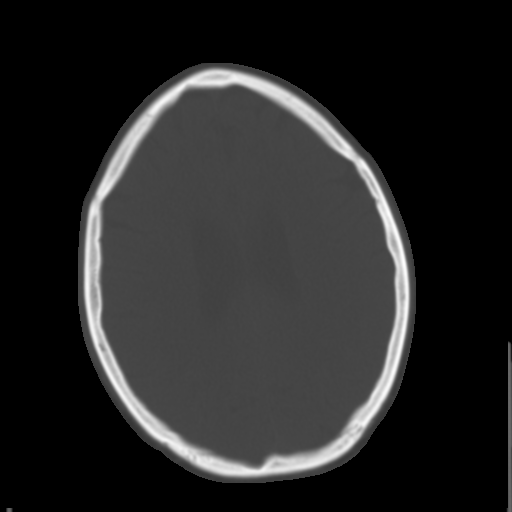
[im 21/29  brain]
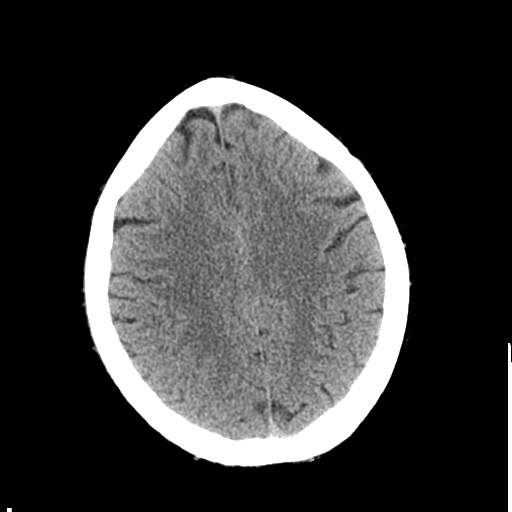
[im 23/29  brain]
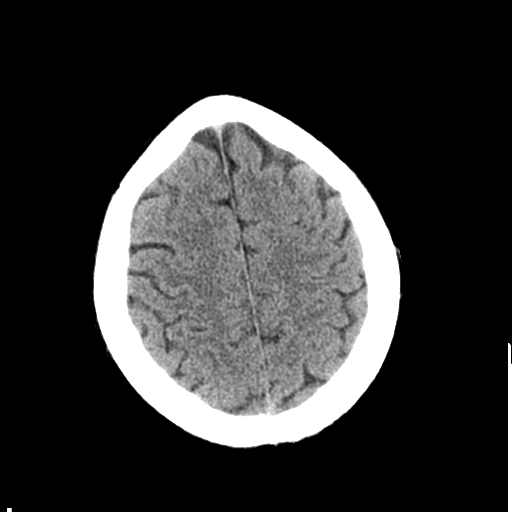
[im 25/29  brain]
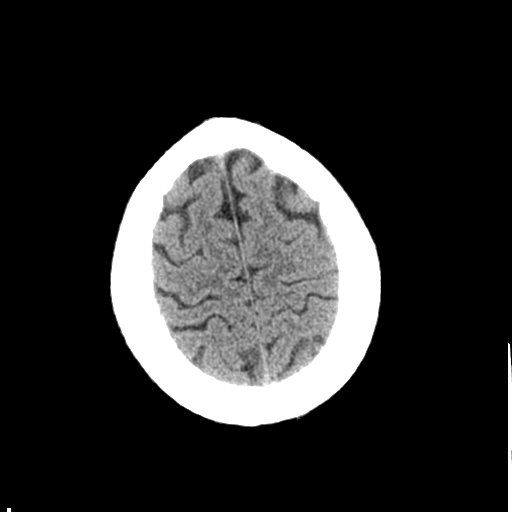
[im 27/29  brain]
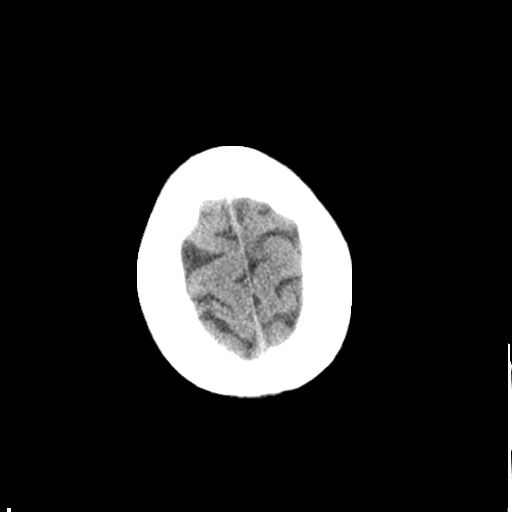
[im 27/29  bone]
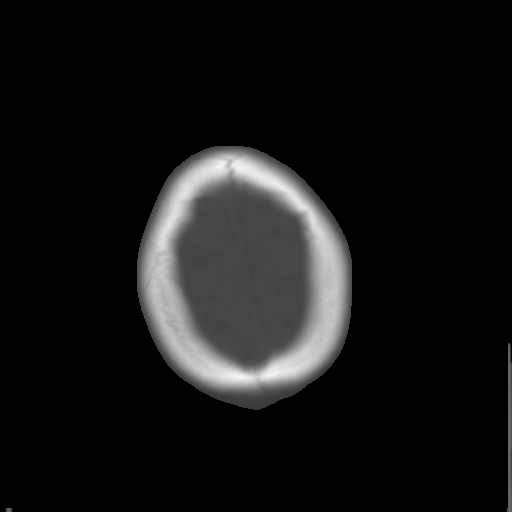

[Series 3: bone windows · axial · 0.45mm/px · z∈[-142,-102]mm · 3 of 29 slices shown]
[im 3/29  bone]
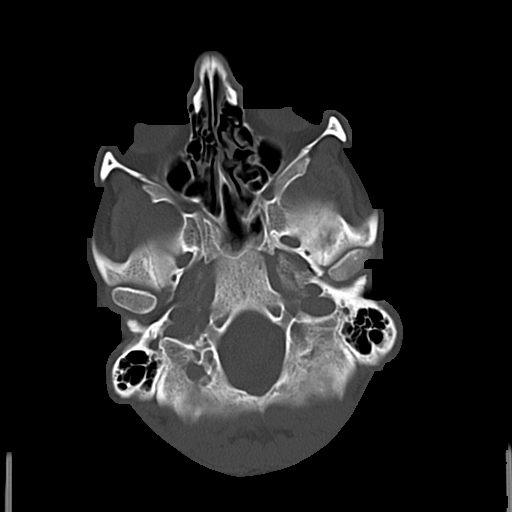
[im 7/29  bone]
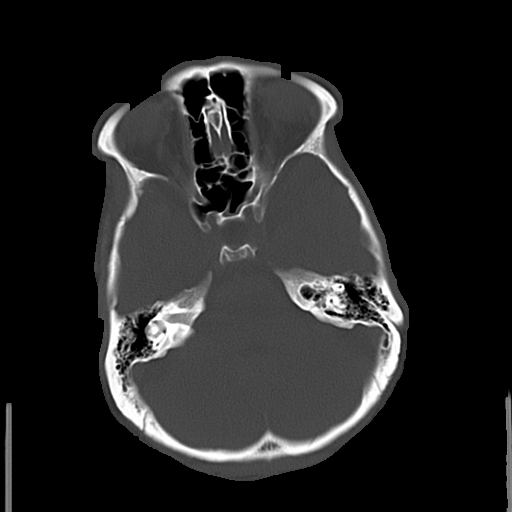
[im 11/29  bone]
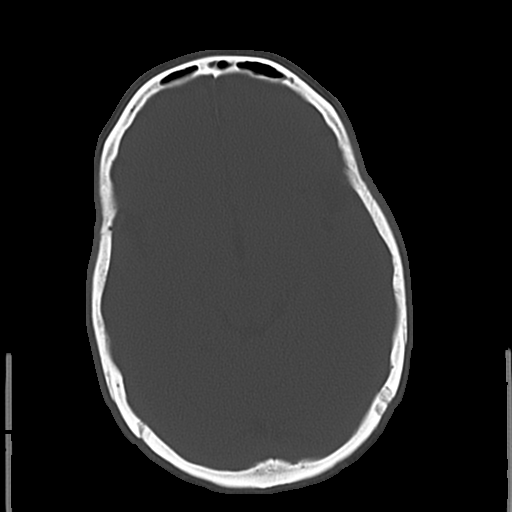

[16 of 30 positions shown; findings below may reference images not displayed]

FINDINGS: Mild generalized volume loss noted.

No acute intracranial abnormalities are identified, including mass
lesion or mass effect, hydrocephalus, extra-axial fluid collection,
midline shift, hemorrhage, or acute infarction.

The visualized bony calvarium is unremarkable.
IMPRESSION: No evidence of acute intracranial abnormality.

## 2017-07-24 ENCOUNTER — Encounter (HOSPITAL_COMMUNITY): Payer: Self-pay

## 2017-07-24 ENCOUNTER — Emergency Department (HOSPITAL_COMMUNITY)
Admission: EM | Admit: 2017-07-24 | Discharge: 2017-07-26 | Disposition: A | Discharge status: Home / Self Care | Payer: Non-veteran care | Attending: Emergency Medicine | Admitting: Emergency Medicine

## 2017-07-24 DIAGNOSIS — R440 Auditory hallucinations: Secondary | ICD-10-CM | POA: Insufficient documentation

## 2017-07-24 DIAGNOSIS — Z79899 Other long term (current) drug therapy: Secondary | ICD-10-CM | POA: Diagnosis not present

## 2017-07-24 DIAGNOSIS — F1419 Cocaine abuse with unspecified cocaine-induced disorder: Secondary | ICD-10-CM | POA: Diagnosis not present

## 2017-07-24 DIAGNOSIS — F17210 Nicotine dependence, cigarettes, uncomplicated: Secondary | ICD-10-CM | POA: Insufficient documentation

## 2017-07-24 DIAGNOSIS — F1414 Cocaine abuse with cocaine-induced mood disorder: Secondary | ICD-10-CM

## 2017-07-24 DIAGNOSIS — F1410 Cocaine abuse, uncomplicated: Secondary | ICD-10-CM

## 2017-07-24 DIAGNOSIS — F1099 Alcohol use, unspecified with unspecified alcohol-induced disorder: Secondary | ICD-10-CM | POA: Diagnosis present

## 2017-07-24 LAB — CBC
HEMATOCRIT: 39.3 % (ref 39.0–52.0)
Hemoglobin: 13.8 g/dL (ref 13.0–17.0)
MCH: 31.4 pg (ref 26.0–34.0)
MCHC: 35.1 g/dL (ref 30.0–36.0)
MCV: 89.5 fL (ref 78.0–100.0)
PLATELETS: 208 10*3/uL (ref 150–400)
RBC: 4.39 MIL/uL (ref 4.22–5.81)
RDW: 13.5 % (ref 11.5–15.5)
WBC: 7 10*3/uL (ref 4.0–10.5)

## 2017-07-24 NOTE — ED Triage Notes (Signed)
Pt states he's suicidal with no plan Pt also not taking his meds

## 2017-07-24 NOTE — ED Notes (Signed)
Bed: [facility]3 Expected date:  Expected time:  Means of arrival:  Comments: 

## 2017-07-25 LAB — COMPREHENSIVE METABOLIC PANEL WITH GFR
ALT: 76 U/L — ABNORMAL HIGH (ref 17–63)
AST: 47 U/L — ABNORMAL HIGH (ref 15–41)
Albumin: 3.7 g/dL (ref 3.5–5.0)
Alkaline Phosphatase: 44 U/L (ref 38–126)
Anion gap: 8 (ref 5–15)
BUN: 12 mg/dL (ref 6–20)
CO2: 23 mmol/L (ref 22–32)
Calcium: 9 mg/dL (ref 8.9–10.3)
Chloride: 108 mmol/L (ref 101–111)
Creatinine, Ser: 0.81 mg/dL (ref 0.61–1.24)
GFR calc Af Amer: >60 mL/min
GFR calc non Af Amer: >60 mL/min
Glucose, Bld: 95 mg/dL (ref 65–99)
Potassium: 4 mmol/L (ref 3.5–5.1)
Sodium: 139 mmol/L (ref 135–145)
Total Bilirubin: 0.5 mg/dL (ref 0.3–1.2)
Total Protein: 6.8 g/dL (ref 6.5–8.1)

## 2017-07-25 LAB — RAPID URINE DRUG SCREEN, HOSP PERFORMED
AMPHETAMINES: NOT DETECTED
BARBITURATES: NOT DETECTED
BENZODIAZEPINES: NOT DETECTED
COCAINE: POSITIVE — AB
Opiates: NOT DETECTED
TETRAHYDROCANNABINOL: POSITIVE — AB

## 2017-07-25 LAB — SALICYLATE LEVEL: Salicylate Lvl: <7 mg/dL (ref 2.8–30.0)

## 2017-07-25 LAB — ETHANOL: Alcohol, Ethyl (B): <10 mg/dL

## 2017-07-25 LAB — ACETAMINOPHEN LEVEL: Acetaminophen (Tylenol), Serum: <10 ug/mL — ABNORMAL LOW (ref 10–30)

## 2017-07-25 MED ORDER — ACETAMINOPHEN 325 MG PO TABS: 650.0000 mg | ORAL_TABLET | ORAL | Status: DC | PRN

## 2017-07-25 MED ORDER — ONDANSETRON HCL 4 MG PO TABS: 4.0000 mg | ORAL_TABLET | Freq: Three times a day (TID) | ORAL | Status: DC | PRN
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
## 2017-07-25 NOTE — ED Notes (Signed)
Pt oriented to room and unit.  Pt. Is calm and cooperative.  Pt denies S/I, H/I, and AVH.  15 minute checks and video monitoring in place.

## 2017-07-25 NOTE — ED Notes (Signed)
[facility] Expected date:  Expected time:  Means of arrival:  Comments: ROOM 12
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
## 2017-07-25 NOTE — Progress Notes (Signed)
07/25/17 1354:  LRT went to pt room to offer activities, pt was sleep.   Caroll RancherMarjette Arneta Mahmood, LRT/CTRS

## 2017-07-26 DIAGNOSIS — Z818 Family history of other mental and behavioral disorders: Secondary | ICD-10-CM

## 2017-07-26 DIAGNOSIS — F1414 Cocaine abuse with cocaine-induced mood disorder: Secondary | ICD-10-CM

## 2017-07-26 DIAGNOSIS — F1910 Other psychoactive substance abuse, uncomplicated: Secondary | ICD-10-CM

## 2017-07-26 DIAGNOSIS — F1210 Cannabis abuse, uncomplicated: Secondary | ICD-10-CM

## 2017-07-26 DIAGNOSIS — F17210 Nicotine dependence, cigarettes, uncomplicated: Secondary | ICD-10-CM

## 2017-07-26 NOTE — ED Notes (Signed)
Pt discharged home. Discharged instructions read to pt who verbalized understanding. All belongings returned to pt who signed for same. Denies SI/HI, is not delusional and not responding to internal stimuli. Escorted pt to the ED exit.    
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
## 2017-07-26 NOTE — BH Assessment (Signed)
[facility]  Per Juanetta BeetsJacqueline Norman, DO, this pt does not require psychiatric hospitalization at this time.  [facility]  He does not want outpatient referral information, but would benefit from seeing Peer Support Specialists; they will be asked to speak to pt.  Pt's nurse, Diane, has been notified.  Doylene Canninghomas Aishwarya Shiplett, MA Triage Specialist [phone]
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
## 2017-07-26 NOTE — ED Notes (Signed)
Patient sleeping most of the shift. Calm and cooperative. Denies pain, SI/HI, AH/VH at this time. No behavior issues noted. Will continue to monitor patient.

## 2017-09-03 ENCOUNTER — Emergency Department (HOSPITAL_COMMUNITY)
Admission: EM | Admit: 2017-09-03 | Discharge: 2017-09-07 | Disposition: A | Discharge status: Home / Self Care | Payer: Self-pay | Attending: Emergency Medicine | Admitting: Emergency Medicine

## 2017-09-03 ENCOUNTER — Encounter (HOSPITAL_COMMUNITY): Payer: Self-pay

## 2017-09-03 ENCOUNTER — Other Ambulatory Visit: Payer: Self-pay

## 2017-09-03 DIAGNOSIS — F4310 Post-traumatic stress disorder, unspecified: Secondary | ICD-10-CM | POA: Insufficient documentation

## 2017-09-03 DIAGNOSIS — F1910 Other psychoactive substance abuse, uncomplicated: Secondary | ICD-10-CM | POA: Insufficient documentation

## 2017-09-03 DIAGNOSIS — F200 Paranoid schizophrenia: Secondary | ICD-10-CM | POA: Insufficient documentation

## 2017-09-03 DIAGNOSIS — F17210 Nicotine dependence, cigarettes, uncomplicated: Secondary | ICD-10-CM | POA: Insufficient documentation

## 2017-09-03 LAB — CBC WITH DIFFERENTIAL/PLATELET
BASOS ABS: 0 10*3/uL (ref 0.0–0.1)
Basophils Relative: 0 %
EOS PCT: 0 %
Eosinophils Absolute: 0 10*3/uL (ref 0.0–0.7)
HCT: 38.2 % — ABNORMAL LOW (ref 39.0–52.0)
Hemoglobin: 13.5 g/dL (ref 13.0–17.0)
LYMPHS ABS: 0.8 10*3/uL (ref 0.7–4.0)
LYMPHS PCT: 11 %
MCH: 31.1 pg (ref 26.0–34.0)
MCHC: 35.3 g/dL (ref 30.0–36.0)
MCV: 88 fL (ref 78.0–100.0)
MONO ABS: 0.5 10*3/uL (ref 0.1–1.0)
MONOS PCT: 7 %
Neutro Abs: 6 10*3/uL (ref 1.7–7.7)
Neutrophils Relative %: 82 %
PLATELETS: 203 10*3/uL (ref 150–400)
RBC: 4.34 MIL/uL (ref 4.22–5.81)
RDW: 12.9 % (ref 11.5–15.5)
WBC: 7.3 10*3/uL (ref 4.0–10.5)

## 2017-09-03 LAB — COMPREHENSIVE METABOLIC PANEL
ALBUMIN: 4 g/dL (ref 3.5–5.0)
ALT: 90 U/L — ABNORMAL HIGH (ref 17–63)
ANION GAP: 10 (ref 5–15)
AST: 59 U/L — ABNORMAL HIGH (ref 15–41)
Alkaline Phosphatase: 58 U/L (ref 38–126)
BUN: 13 mg/dL (ref 6–20)
CO2: 20 mmol/L — AB (ref 22–32)
Calcium: 9.2 mg/dL (ref 8.9–10.3)
Chloride: 105 mmol/L (ref 101–111)
Creatinine, Ser: 1.14 mg/dL (ref 0.61–1.24)
GFR calc non Af Amer: >60 mL/min (ref >60)
GLUCOSE: 86 mg/dL (ref 65–99)
POTASSIUM: 4.1 mmol/L (ref 3.5–5.1)
SODIUM: 135 mmol/L (ref 135–145)
Total Bilirubin: 1.5 mg/dL — ABNORMAL HIGH (ref 0.3–1.2)
Total Protein: 6.8 g/dL (ref 6.5–8.1)

## 2017-09-03 LAB — RAPID URINE DRUG SCREEN, HOSP PERFORMED
Amphetamines: POSITIVE — AB
BARBITURATES: NOT DETECTED
Benzodiazepines: NOT DETECTED
Cocaine: NOT DETECTED
Opiates: NOT DETECTED
TETRAHYDROCANNABINOL: POSITIVE — AB

## 2017-09-03 LAB — ETHANOL: Alcohol, Ethyl (B): <10 mg/dL (ref <10)

## 2017-09-03 LAB — SALICYLATE LEVEL

## 2017-09-03 LAB — ACETAMINOPHEN LEVEL

## 2017-09-03 NOTE — ED Notes (Signed)
Pt placed in paper scrubs, [employer] outside room

## 2017-09-03 NOTE — ED Notes (Signed)
Sitter bedside. Pt provided with Malaw[place]urkey sandwich and ice water
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
## 2017-09-03 NOTE — ED Notes (Signed)
Pt found to be attempting to urinate in floor. Pt provided with urinal and encouraged to provide sample in it instead of floor or sink.
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
## 2017-09-04 MED ORDER — STERILE WATER FOR INJECTION IJ SOLN
INTRAMUSCULAR | Status: AC
  Administered 2017-09-04: 12:00:00
  Filled 2017-09-04: qty 10

## 2017-09-04 MED ORDER — LORAZEPAM 2 MG/ML IJ SOLN
2.0000 mg | Freq: Once | INTRAMUSCULAR | Status: AC
  Administered 2017-09-04: 2 mg via INTRAMUSCULAR
  Filled 2017-09-04: qty 1

## 2017-09-04 MED ORDER — ZIPRASIDONE MESYLATE 20 MG IM SOLR
20.0000 mg | Freq: Once | INTRAMUSCULAR | Status: DC
  Filled 2017-09-04: qty 20

## 2017-09-04 MED ORDER — LORAZEPAM 2 MG/ML IJ SOLN: 2.0000 mg | Freq: Once | INTRAMUSCULAR | Status: DC

## 2017-09-04 MED ORDER — ZIPRASIDONE MESYLATE 20 MG IM SOLR
20.0000 mg | Freq: Once | INTRAMUSCULAR | Status: AC
  Administered 2017-09-04: 20 mg via INTRAMUSCULAR

## 2017-09-04 NOTE — ED Notes (Signed)
Pt resting comfortably

## 2017-09-04 NOTE — ED Notes (Signed)
Pt breakfast tray ordered.

## 2017-09-04 NOTE — BH Assessment (Signed)
Received TTS consult request. Pt is somnolent and unable to answer any questions. [facility] staff will contact TTS when Pt is able to participate in assessment.   Harlin RainFord Ellis Patsy BaltimoreWarrick Jr, LPC, [facility], [facility] Triage Specialist [phone]

## 2017-09-04 NOTE — Progress Notes (Signed)
CSW contacted pt's nurse, Gabriel RungMonique, RN, to determine if patient was awake enough to be assessed.  Pt is still somulent and unable to be assessed at this time.  Nurse will submit referral request when he is awake.  Timmothy EulerJean T. Kaylyn LimSutter, MSW, LCSWA Disposition Clinical Social Work [phone] (cell) [phone] (office)

## 2017-09-04 NOTE — ED Notes (Addendum)
Confirmed with magistrate that they have received IVC paperwork on this pt at 1220

## 2017-09-04 NOTE — ED Notes (Signed)
Pt dinner tray at bedside.  

## 2017-09-04 NOTE — ED Notes (Signed)
TTS device at bedside.  

## 2017-09-04 NOTE — BHH Counselor (Signed)
TTS attempted to preform tele-assessment. Patient was given Geodon before TTS called. Patient asleep when TTS called, unable to be awaken.   Armeniahina, patient's sister was in the room.   TTS will be contacted when patient awakes and tele-assessment able to be preformed.

## 2017-09-04 NOTE — ED Notes (Signed)
Dinner tray ordered at 5:14 

## 2017-09-04 NOTE — ED Notes (Signed)
Pt continues to sleep. Pt respirations equal and unlabored.

## 2017-09-04 NOTE — ED Notes (Signed)
Pts belongings inventoried and placed in locker 2.  

## 2017-09-04 NOTE — ED Notes (Signed)
Patient sitting in room alone, speaking with varying tones of voice, sometimes in english, sometimes nonsense sounds.

## 2017-09-04 NOTE — ED Notes (Signed)
Pt unable to complete TSS due to being unable to stay awake. BH communicated to call back when pt is more alert

## 2017-09-04 NOTE — ED Provider Notes (Signed)
11:05: Called to room for acute psychosis.  The patient is currently angry confrontational, defensive, pacing in room, and does not respond to recommendation to sit down.  He is in process for placement.  Sedating medication ordered.   Mancel BaleWentz, Akina Maish, MD 09/05/17 [phone]

## 2017-09-05 MED ORDER — ZIPRASIDONE MESYLATE 20 MG IM SOLR
10.0000 mg | Freq: Four times a day (QID) | INTRAMUSCULAR | Status: DC | PRN
  Administered 2017-09-05 – 2017-09-06 (×2): 10 mg via INTRAMUSCULAR
  Filled 2017-09-05 (×2): qty 20

## 2017-09-05 MED ORDER — STERILE WATER FOR INJECTION IJ SOLN
INTRAMUSCULAR | Status: AC
  Administered 2017-09-05: 1.2 mL
  Filled 2017-09-05: qty 10

## 2017-09-05 NOTE — BH Assessment (Signed)
Called to set-up assessment. Per nurse Gabriel RungMonique, pt woke up and began behaving erratically and expressing delusional, irrational paranoid thoughts. Pt to be given Geodon and not able to participate in assessment at this due to agitation.  TTS Consult to be re-ordered when pt can participate.  Beryle FlockMary Kevona Lupinacci, MS, CRC, [facility] Triage Specialist [facility]

## 2017-09-05 NOTE — ED Provider Notes (Signed)
Called to room due to his outburst and psychosis Pt awake/alert, he is in his room geodon ordered, pt has tolerated this previously Awaiting placement    Steve [name], Woodson Macha, MD 09/05/17 0345

## 2017-09-05 NOTE — Progress Notes (Addendum)
Per chart, the patient has [employer], [employer]eteran's Administration as his primary insurance coverage. Disposition CSW contacted the following [facility] for possible inpatient psychiatric beds:  [facility] - Per April Pharmacist, community(Transfer Coordinator), their facility has limited beds and as of now there are no available male psych beds. April reports that the patient [facility], and advised CSW to explore whether the [facility] had any beds available today. April states she has placed a notification in the patient's [place] so that the [facility] system is aware that the patient needs placement.   [facility] - Per Jacki ConesLaurie Pharmacist, community(Transfer Coordinator), there are no available psychiatric beds today. Jacki ConesLaurie reports the [facility] is currently [facility] for their psychiatric beds.    [facility] -  CSW spoke with Olegario MessierKathy, who provided contact information for their Transfer Coordinator, Lelon Mastngela McNeil [phone]). CSW attempted to contact Ms. Uvaldo RisingMcNeil, however her voicemail states she will be out of the office until 09/09/17.   TTS/Disposition CSW will continue to follow.    Steve DaubJolan Nykeem [name], MSW, LCSWA Clinical Social Worker (Disposition) [facility]  [phone]

## 2017-09-05 NOTE — ED Notes (Signed)
Patient agreed to Geodon shot; patient did not have to be forced to take shot; patient remains in room sitting on bed after shot at this time-Monique,RN

## 2017-09-05 NOTE — ED Notes (Signed)
Patient was told he was about to be assessed by TTS; pt states to RN that he can not be assessed; RN asked for clarification; patient states he is trying to "reach another dimension"; Patient then asked the RN if "he is still here?; Sitter reports to RN that patient has been asking about someone name "Jimmy from last night"; Patient appears to be spaced out on RN's assessment; no further conversations had between RN and patient at this time; Patient asked sitter why we cut his jacket off and proceeds to get up out of bed; Sitter able to redirect patient back to bed;

## 2017-09-05 NOTE — ED Notes (Signed)
Patient stood up to [employer] Business MachinesSitter with aggressive stance; RN called security due to patient started to yell and curse; Patient asked "what government agency to I need to kill?" Patient is currently in the bathroom demanding to take a shower; RN asked patient to go back to room; Patient attempted to walk behind RN station; [employer] requested patient not to go behind desk; patient has smart remarks and went back in room; pt in room making hissing noises; Mary with TTS advised of situation and will have to TTS patient tomorrow.-Monique,RN

## 2017-09-05 NOTE — Progress Notes (Addendum)
CSW contacted [facility] in order to see if there was another Clinical cytogeneticistTransfer Coordinator that was handling patient admissions, as the original person is on vacation until 09-09-17.  CSW was instructed to send pt referral packet to the attention of Lydia Guilesavid [place], another TC, at 8:00 AM on 09/06/17.  Fax number is [phone]  CSW will advise 1st shift Disposition CSW and ask her to follow up in the AM if patient has not already been placed.Steve [name]  Steve Kaman T. Steve LimSutter, MSW, LCSWA Disposition Clinical Social Work [phone] (cell) [phone] (office)

## 2017-09-05 NOTE — ED Notes (Signed)
Pt showered, changed scrubs and bed linens were changed.

## 2017-09-05 NOTE — ED Notes (Signed)
Pt told sitter, "I just want to die. I just want to take a bunch of drugs and just waste away." Pt asking about what the plan is, pt informed by RN, they are seeking placement for inpatient treatment. Pt asked if he may leave, pt informed by RN that he is here involuntary and can not leave." Pt remained calm and cooperative and returned to his room without difficulty.
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
## 2017-09-05 NOTE — ED Notes (Signed)
Pt's personal care items(soap, deodorant, comb, toothpaste, toothbrush) were locked in cabinet in room.

## 2017-09-05 NOTE — ED Notes (Signed)
Regular diet breakfast tray ordered @ 0522.  

## 2017-09-05 NOTE — Progress Notes (Signed)
Patient meets criteria for inpatient treatment. CSW faxed referrals to the following inpatient facilities for review:  [facility], [facility], [facility], [facility], [facility] [address] [facility], [address], [facility], [facility], [facility], [facility], [facility]   TTS will continue to seek bed placement.   Baldo DaubJolan Gaudencio Chesnut, MSW, LCSWA Clinical Social Worker (Disposition) [facility]  [phone]

## 2017-09-05 NOTE — ED Notes (Signed)
Ordered meal tray for patient

## 2017-09-06 MED ORDER — LORAZEPAM 1 MG PO TABS
1.0000 mg | ORAL_TABLET | Freq: Four times a day (QID) | ORAL | Status: DC | PRN
  Administered 2017-09-06 – 2017-09-07 (×2): 1 mg via ORAL
  Filled 2017-09-06 (×2): qty 1

## 2017-09-06 MED ORDER — STERILE WATER FOR INJECTION IJ SOLN
INTRAMUSCULAR | Status: AC
  Administered 2017-09-06: 1.2 mL
  Filled 2017-09-06: qty 10

## 2017-09-06 NOTE — ED Notes (Signed)
Regular Diet ordered for Lunch. 

## 2017-09-06 NOTE — ED Notes (Signed)
Pt states that he is starting to get anxious

## 2017-09-06 NOTE — BHH Counselor (Signed)
Re-assessment:   Patient denies SI and HI. When asked if he sees or hears things others do not see or hear patient responded, "I do not think so."

## 2017-09-06 NOTE — ED Notes (Signed)
TTS called and updated on pt being awake and available to have TTS completed

## 2017-09-06 NOTE — ED Notes (Signed)
IVC paperwork faxed to [facility] 

## 2017-09-06 NOTE — Progress Notes (Signed)
[facility]    Steve [name], MSW, LCSWA Clinical Social Worker (Disposition) [facility]  [phone]

## 2017-09-06 NOTE — ED Notes (Addendum)
Pt reports agitation and is pacing in room, pt requesting medication, pt states, "I am ready to go. I was just on drugs yesterday. I want to leave." pt informed of IVC status and that at this time he cannot leave, pt agrees to receive Geodon IM

## 2017-09-06 NOTE — ED Notes (Signed)
IVC papers faxed to [facility].  

## 2017-09-06 NOTE — BHH Counselor (Addendum)
Re-assessment  TTS attempted to complete re-assessment on patient. Per his nurse Toniann FailWendy, RN, patient was given Geodon voluntarily and is presently asleep.   TTS will be contacted when patient is awake and able to complete assessment.

## 2017-09-06 NOTE — ED Notes (Signed)
Breakfast tray ordered at 0612 

## 2017-09-07 DIAGNOSIS — Z818 Family history of other mental and behavioral disorders: Secondary | ICD-10-CM

## 2017-09-07 DIAGNOSIS — F419 Anxiety disorder, unspecified: Secondary | ICD-10-CM

## 2017-09-07 DIAGNOSIS — R450 Nervousness: Secondary | ICD-10-CM

## 2017-09-07 DIAGNOSIS — F1210 Cannabis abuse, uncomplicated: Secondary | ICD-10-CM

## 2017-09-07 DIAGNOSIS — F209 Schizophrenia, unspecified: Secondary | ICD-10-CM

## 2017-09-07 DIAGNOSIS — R443 Hallucinations, unspecified: Secondary | ICD-10-CM

## 2017-09-07 DIAGNOSIS — F17210 Nicotine dependence, cigarettes, uncomplicated: Secondary | ICD-10-CM

## 2017-09-07 MED ORDER — ALUM & MAG HYDROXIDE-SIMETH 200-200-20 MG/5ML PO SUSP
30.0000 mL | ORAL | Status: DC | PRN
  Administered 2017-09-07: 30 mL via ORAL
  Filled 2017-09-07: qty 30
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
## 2017-09-07 NOTE — ED Provider Notes (Signed)
  Physical Exam  BP 119/66 (BP Location: Right Arm)   Pulse 72   Temp 98.3 F (36.8 C) (Oral)   Resp 20   Ht 5\' 11"  (1.803 m)   Wt 77.1 kg (170 lb)   SpO2 100%   BMI 23.71 kg/m   Physical Exam  ED Course/Procedures     Procedures  MDM  Patient been seen by psychiatry and cleared for discharge.       Benjiman CorePickering, Marco Raper, MD 09/07/17 [phone]

## 2017-09-07 NOTE — ED Notes (Signed)
Ativan given as requested. Pt eating snack.
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
## 2017-09-07 NOTE — ED Notes (Signed)
TTS being performed.  

## 2017-09-07 NOTE — ED Notes (Signed)
Pt asking for "give me a shot to knock me out. I've been here all week and I am ready to leave. If I have to stay, I want something to knock me out. Y'all need to get Security back here." RN encouraged pt to vent his feelings. Advised pt Telepsych to be performed soon and asked pt if he can wait until after this so that he will be alert enough to talk w/NP. Voiced understanding and states he will.

## 2017-09-07 NOTE — ED Notes (Signed)
Telepsych being performed. 

## 2017-09-07 NOTE — ED Notes (Signed)
Pt ambulated to hallway - threw his trash away - returned to room.

## 2017-09-08 ENCOUNTER — Encounter (HOSPITAL_COMMUNITY): Payer: Self-pay | Admitting: *Deleted

## 2017-09-08 ENCOUNTER — Emergency Department (HOSPITAL_COMMUNITY)
Admission: EM | Admit: 2017-09-08 | Discharge: 2017-09-10 | Disposition: A | Discharge status: Home / Self Care | Payer: Non-veteran care | Attending: Emergency Medicine | Admitting: Emergency Medicine

## 2017-09-08 ENCOUNTER — Emergency Department (HOSPITAL_COMMUNITY): Payer: Non-veteran care

## 2017-09-08 DIAGNOSIS — R440 Auditory hallucinations: Secondary | ICD-10-CM | POA: Insufficient documentation

## 2017-09-08 DIAGNOSIS — R443 Hallucinations, unspecified: Secondary | ICD-10-CM

## 2017-09-08 DIAGNOSIS — F17210 Nicotine dependence, cigarettes, uncomplicated: Secondary | ICD-10-CM | POA: Diagnosis not present

## 2017-09-08 DIAGNOSIS — F1910 Other psychoactive substance abuse, uncomplicated: Secondary | ICD-10-CM | POA: Diagnosis not present

## 2017-09-08 DIAGNOSIS — Z79899 Other long term (current) drug therapy: Secondary | ICD-10-CM | POA: Insufficient documentation

## 2017-09-08 DIAGNOSIS — R45851 Suicidal ideations: Secondary | ICD-10-CM | POA: Insufficient documentation

## 2017-09-08 DIAGNOSIS — F419 Anxiety disorder, unspecified: Secondary | ICD-10-CM | POA: Diagnosis not present

## 2017-09-08 DIAGNOSIS — R0789 Other chest pain: Secondary | ICD-10-CM | POA: Diagnosis not present

## 2017-09-08 DIAGNOSIS — F919 Conduct disorder, unspecified: Secondary | ICD-10-CM | POA: Diagnosis not present

## 2017-09-08 DIAGNOSIS — D649 Anemia, unspecified: Secondary | ICD-10-CM | POA: Insufficient documentation

## 2017-09-08 DIAGNOSIS — F15951 Other stimulant use, unspecified with stimulant-induced psychotic disorder with hallucinations: Secondary | ICD-10-CM | POA: Diagnosis present

## 2017-09-08 LAB — CBC
HEMATOCRIT: 38.6 % — AB (ref 39.0–52.0)
HEMOGLOBIN: 13.8 g/dL (ref 13.0–17.0)
MCH: 32.1 pg (ref 26.0–34.0)
MCHC: 35.8 g/dL (ref 30.0–36.0)
MCV: 89.8 fL (ref 78.0–100.0)
PLATELETS: 213 10*3/uL (ref 150–400)
RBC: 4.3 MIL/uL (ref 4.22–5.81)
RDW: 12.8 % (ref 11.5–15.5)
WBC: 3.8 10*3/uL — AB (ref 4.0–10.5)

## 2017-09-08 LAB — COMPREHENSIVE METABOLIC PANEL
ALBUMIN: 3.8 g/dL (ref 3.5–5.0)
ALT: 96 U/L — AB (ref 17–63)
AST: 55 U/L — AB (ref 15–41)
Alkaline Phosphatase: 48 U/L (ref 38–126)
Anion gap: 6 (ref 5–15)
BUN: 10 mg/dL (ref 6–20)
CHLORIDE: 102 mmol/L (ref 101–111)
CO2: 26 mmol/L (ref 22–32)
CREATININE: 0.79 mg/dL (ref 0.61–1.24)
Calcium: 8.8 mg/dL — ABNORMAL LOW (ref 8.9–10.3)
GFR calc Af Amer: >60 mL/min (ref >60)
GFR calc non Af Amer: >60 mL/min (ref >60)
GLUCOSE: 92 mg/dL (ref 65–99)
POTASSIUM: 4.8 mmol/L (ref 3.5–5.1)
Sodium: 134 mmol/L — ABNORMAL LOW (ref 135–145)
Total Bilirubin: 0.8 mg/dL (ref 0.3–1.2)
Total Protein: 6.5 g/dL (ref 6.5–8.1)

## 2017-09-08 LAB — I-STAT TROPONIN, ED
TROPONIN I, POC: 0 ng/mL (ref 0.00–0.08)
TROPONIN I, POC: 0 ng/mL (ref 0.00–0.08)

## 2017-09-08 LAB — RAPID URINE DRUG SCREEN, HOSP PERFORMED
AMPHETAMINES: POSITIVE — AB
BARBITURATES: NOT DETECTED
BENZODIAZEPINES: NOT DETECTED
Cocaine: POSITIVE — AB
Opiates: NOT DETECTED
TETRAHYDROCANNABINOL: NOT DETECTED

## 2017-09-08 LAB — ETHANOL: Alcohol, Ethyl (B): <10 mg/dL (ref <10)

## 2017-09-08 LAB — SALICYLATE LEVEL: Salicylate Lvl: <7 mg/dL (ref 2.8–30.0)

## 2017-09-08 LAB — ACETAMINOPHEN LEVEL: Acetaminophen (Tylenol), Serum: <10 ug/mL — ABNORMAL LOW (ref 10–30)

## 2017-09-08 MED ORDER — IPRATROPIUM-ALBUTEROL 0.5-2.5 (3) MG/3ML IN SOLN
3.0000 mL | Freq: Once | RESPIRATORY_TRACT | Status: AC
  Administered 2017-09-08: 3 mL via RESPIRATORY_TRACT
  Filled 2017-09-08: qty 3

## 2017-09-08 MED ORDER — ACETAMINOPHEN 500 MG PO TABS
1000.0000 mg | ORAL_TABLET | Freq: Once | ORAL | Status: AC
  Administered 2017-09-08: 500 mg via ORAL
  Filled 2017-09-08: qty 2
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
## 2017-09-08 NOTE — ED Notes (Signed)
Safety sitter at bedside 
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
## 2017-09-08 NOTE — ED Notes (Signed)
Pt is in paper scrubs, called security to wand pt and belongings removed from pts room.

## 2017-09-08 NOTE — ED Notes (Signed)
Pt had syncopal episode at triage while getting his blood drawn. Pt was lowered down onto ground, no injuries noted.

## 2017-09-08 NOTE — ED Provider Notes (Signed)
8:30 PM Patient care assumed from Sharen Hecklaudia Gibbons, PA-C at shift change pending delta troponin. If negative, cleared for TTS evaluation.  10:39 PM Patient medically cleared.  Psych hold orders placed.  2:52 AM TTS recommending observation with psychiatric evaluation in the AM. Disposition to be determined by oncoming ED provider.   Antony MaduraHumes, Lucresia Simic, PA-C 09/09/17 0252    Melene PlanFloyd, Dan, DO 09/10/17 2004

## 2017-09-08 NOTE — ED Notes (Signed)
Patient transported to x-ray. ?

## 2017-09-09 MED ORDER — ACETAMINOPHEN 500 MG PO TABS
1000.0000 mg | ORAL_TABLET | Freq: Once | ORAL | Status: DC
  Filled 2017-09-09: qty 2

## 2017-09-09 MED ORDER — ZOLPIDEM TARTRATE 5 MG PO TABS
10.0000 mg | ORAL_TABLET | Freq: Once | ORAL | Status: AC
  Administered 2017-09-09: 10 mg via ORAL
  Filled 2017-09-09: qty 2

## 2017-09-09 MED ORDER — LORAZEPAM 1 MG PO TABS
1.0000 mg | ORAL_TABLET | Freq: Once | ORAL | Status: AC
  Administered 2017-09-09: 1 mg via ORAL
  Filled 2017-09-09: qty 1

## 2017-09-09 NOTE — ED Notes (Signed)
Appetite good.  

## 2017-09-09 NOTE — ED Notes (Signed)
Pt ambulated to shower room w/ sitter and security
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
## 2017-09-09 NOTE — ED Notes (Signed)
Pt requested to speak with this RN. On entering room pt was very agitated and restless. Pt stated "I don't know where the fuck I am, I keep hearing these fucking voices in my head and they won't fucking stop, I've been fucking walking around outside and my fucking feet smell like fucking rot", this RN asked pt what the voices were saying to which pt stated "I don't want to fucking talk about it". Pt offered a shower and EDP made aware.

## 2017-09-09 NOTE — BHH Counselor (Signed)
Re-assessment:   Patient report he feels confused of why he keep hearing voices and they are controlling this thoughts. Patient endorse visual hallucinations of seeing snakes. Feel paranoia that people are following him. Report he feels suicidal because he does not to continue with living with the voices and having people following him all the time. Homicidal ideations with no identified victim.

## 2017-09-09 NOTE — ED Notes (Signed)
Telepsych being performed. 

## 2017-09-09 NOTE — ED Notes (Signed)
Pt ambulatory to restroom

## 2017-09-09 NOTE — BHH Counselor (Signed)
Disposition:   Per Inetta Fermoina, NP, Overnight observation for stability and safety with re-evaluation in the morning.

## 2017-09-10 DIAGNOSIS — R450 Nervousness: Secondary | ICD-10-CM | POA: Diagnosis not present

## 2017-09-10 DIAGNOSIS — F419 Anxiety disorder, unspecified: Secondary | ICD-10-CM | POA: Diagnosis not present

## 2017-09-10 DIAGNOSIS — F15951 Other stimulant use, unspecified with stimulant-induced psychotic disorder with hallucinations: Secondary | ICD-10-CM

## 2017-09-10 DIAGNOSIS — R440 Auditory hallucinations: Secondary | ICD-10-CM

## 2017-09-10 DIAGNOSIS — Z818 Family history of other mental and behavioral disorders: Secondary | ICD-10-CM

## 2017-09-10 DIAGNOSIS — F17210 Nicotine dependence, cigarettes, uncomplicated: Secondary | ICD-10-CM

## 2017-09-10 DIAGNOSIS — F1210 Cannabis abuse, uncomplicated: Secondary | ICD-10-CM

## 2017-09-10 MED ORDER — LORAZEPAM 0.5 MG PO TABS
0.5000 mg | ORAL_TABLET | Freq: Once | ORAL | Status: AC
  Administered 2017-09-10: 0.5 mg via ORAL
  Filled 2017-09-10: qty 1

## 2017-09-10 MED ORDER — OLANZAPINE 5 MG PO TBDP: 2.5000 mg | ORAL_TABLET | Freq: Every day | ORAL | Status: DC | PRN

## 2017-09-10 MED ORDER — OLANZAPINE 5 MG PO TBDP
2.5000 mg | ORAL_TABLET | Freq: Once | ORAL | Status: AC
  Administered 2017-09-10: 2.5 mg via ORAL
  Filled 2017-09-10: qty 0.5

## 2017-09-10 MED ORDER — HYDROXYZINE HCL 25 MG PO TABS
25.0000 mg | ORAL_TABLET | Freq: Once | ORAL | Status: DC
  Filled 2017-09-10: qty 1

## 2017-09-10 MED ORDER — OLANZAPINE 2.5 MG PO TABS: 2.5000 mg | ORAL_TABLET | Freq: Every day | ORAL | 0 refills | Status: DC

## 2017-09-10 NOTE — ED Notes (Signed)
Pt to nurses station stating he is having nightmares and increased anxiety.

## 2017-09-10 NOTE — ED Notes (Signed)
Pt ambulatory to the restroom.  

## 2017-09-10 NOTE — ED Notes (Signed)
Breakfast tray delivered
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
## 2017-09-10 NOTE — ED Notes (Signed)
Pt escorted to shower with toiletries and fresh clothing and socks.  Bed linens changed, pt requested to keep same blankets that were already in his room.

## 2017-09-10 NOTE — ED Provider Notes (Addendum)
Resting in bed.  Appears comfortable.  Requesting medicine for anxiety.  Ativan ordered   Doug SouJacubowitz, Mc Bloodworth, MD 09/10/17 [id number]    Doug SouJacubowitz, Athen Riel, MD 09/10/17 [phone] Patient seen by psychiatry and felt to be stable for discharge.  Psychiatry recommends prescription Zyprexa 2.5 mg daily for 7 days.  He will be referred to outpatient resources.  Presently patient does not feel he is a danger to himself or others. Results for orders placed or performed during the hospital encounter of 09/08/17  Comprehensive metabolic panel  Result Value Ref Range   Sodium 134 (L) 135 - 145 mmol/L   Potassium 4.8 3.5 - 5.1 mmol/L   Chloride 102 101 - 111 mmol/L   CO2 26 22 - 32 mmol/L   Glucose, Bld 92 65 - 99 mg/dL   BUN 10 6 - 20 mg/dL   Creatinine, Ser 0.100.79 0.61 - 1.24 mg/dL   Calcium 8.8 (L) 8.9 - 10.3 mg/dL   Total Protein 6.5 6.5 - 8.1 g/dL   Albumin 3.8 3.5 - 5.0 g/dL   AST 55 (H) 15 - 41 U/L   ALT 96 (H) 17 - 63 U/L   Alkaline Phosphatase 48 38 - 126 U/L   Total Bilirubin 0.8 0.3 - 1.2 mg/dL   GFR calc non Af Amer >60 >60 mL/min   GFR calc Af Amer >60 >60 mL/min   Anion gap 6 5 - 15  Ethanol  Result Value Ref Range   Alcohol, Ethyl (B) <10 <10 mg/dL  Salicylate level  Result Value Ref Range   Salicylate Lvl <7.0 2.8 - 30.0 mg/dL  Acetaminophen level  Result Value Ref Range   Acetaminophen (Tylenol), Serum <10 (L) 10 - 30 ug/mL  cbc  Result Value Ref Range   WBC 3.8 (L) 4.0 - 10.5 K/uL   RBC 4.30 4.22 - 5.81 MIL/uL   Hemoglobin 13.8 13.0 - 17.0 g/dL   HCT 27.238.6 (L) 53.639.0 - 64.452.0 %   MCV 89.8 78.0 - 100.0 fL   MCH 32.1 26.0 - 34.0 pg   MCHC 35.8 30.0 - 36.0 g/dL   RDW 03.412.8 74.211.5 - 59.515.5 %   Platelets 213 150 - 400 K/uL  Rapid urine drug screen (hospital performed)  Result Value Ref Range   Opiates NONE DETECTED NONE DETECTED   Cocaine POSITIVE (A) NONE DETECTED   Benzodiazepines NONE DETECTED NONE DETECTED   Amphetamines POSITIVE (A) NONE DETECTED   Tetrahydrocannabinol NONE DETECTED  NONE DETECTED   Barbiturates NONE DETECTED NONE DETECTED  [facility])  Result Value Ref Range   Troponin i, poc 0.00 0.00 - 0.08 ng/mL   Comment 3          [facility]roponin[facility])  Result Value Ref Range   Troponin i, poc 0.00 0.00 - 0.08 ng/mL   Comment 3           Dg Chest 2 View  Result Date: 09/08/2017 CLINICAL DATA:  26 year old male with cough. EXAM: CHEST  2 VIEW COMPARISON:  Chest CT dated 02/27/2014 FINDINGS: The lungs are clear. There is no pleural effusion or pneumothorax. The cardiac silhouette is within normal limits. No acute osseous pathology . IMPRESSION: No active cardiopulmonary disease. Electronically Signed   By: Elgie CollardArash  Radparvar M.D.   On: 09/08/2017 19:02     Doug SouJacubowitz, Aleni Andrus, MD 09/10/17 [phone]

## 2017-09-10 NOTE — Discharge Instructions (Signed)
Call any of the resources listed to get help with your drug problem.  Take the medication prescribed starting tomorrow.  If you have any thought of harming herself or others call 911 immediately

## 2017-09-10 NOTE — Progress Notes (Addendum)
Pt reassessed and no longer meets criteria for inpatient treatment per Claudette Headonrad Withrow, NP who recommends discharge with outpatient resources. CSW notified Doristine CounterJulia N, RN @[facility] ED.  CSW faxed resources to [facility].   Timmothy EulerJean T. Kaylyn LimSutter, MSW, LCSWA Disposition Clinical Social Work [phone] (cell) [phone] (office)

## 2017-09-16 ENCOUNTER — Emergency Department (HOSPITAL_COMMUNITY)
Admission: EM | Admit: 2017-09-16 | Discharge: 2017-09-16 | Disposition: A | Discharge status: Home / Self Care | Payer: Non-veteran care | Attending: Emergency Medicine | Admitting: Emergency Medicine

## 2017-09-16 ENCOUNTER — Emergency Department (HOSPITAL_COMMUNITY): Payer: Non-veteran care

## 2017-09-16 DIAGNOSIS — R4182 Altered mental status, unspecified: Secondary | ICD-10-CM | POA: Insufficient documentation

## 2017-09-16 DIAGNOSIS — F1910 Other psychoactive substance abuse, uncomplicated: Secondary | ICD-10-CM | POA: Insufficient documentation

## 2017-09-16 LAB — RAPID URINE DRUG SCREEN, HOSP PERFORMED
AMPHETAMINES: POSITIVE — AB
BARBITURATES: NOT DETECTED
Benzodiazepines: NOT DETECTED
Cocaine: POSITIVE — AB
OPIATES: NOT DETECTED
TETRAHYDROCANNABINOL: POSITIVE — AB

## 2017-09-16 LAB — COMPREHENSIVE METABOLIC PANEL
ALBUMIN: 4.1 g/dL (ref 3.5–5.0)
ALT: 74 U/L — ABNORMAL HIGH (ref 17–63)
ANION GAP: 7 (ref 5–15)
AST: 39 U/L (ref 15–41)
Alkaline Phosphatase: 51 U/L (ref 38–126)
BILIRUBIN TOTAL: 1.1 mg/dL (ref 0.3–1.2)
BUN: 15 mg/dL (ref 6–20)
CHLORIDE: 105 mmol/L (ref 101–111)
CO2: 25 mmol/L (ref 22–32)
Calcium: 9.1 mg/dL (ref 8.9–10.3)
Creatinine, Ser: 0.95 mg/dL (ref 0.61–1.24)
GFR calc Af Amer: 56 mL/min — ABNORMAL LOW (ref >60)
GFR calc non Af Amer: 49 mL/min — ABNORMAL LOW (ref >60)
GLUCOSE: 114 mg/dL — AB (ref 65–99)
POTASSIUM: 3.5 mmol/L (ref 3.5–5.1)
SODIUM: 137 mmol/L (ref 135–145)
TOTAL PROTEIN: 6.9 g/dL (ref 6.5–8.1)

## 2017-09-16 LAB — CBC
HEMATOCRIT: 36.4 % — AB (ref 39.0–52.0)
HEMOGLOBIN: 12.7 g/dL — AB (ref 13.0–17.0)
MCH: 31 pg (ref 26.0–34.0)
MCHC: 34.9 g/dL (ref 30.0–36.0)
MCV: 88.8 fL (ref 78.0–100.0)
Platelets: 230 10*3/uL (ref 150–400)
RBC: 4.1 MIL/uL — ABNORMAL LOW (ref 4.22–5.81)
RDW: 13.1 % (ref 11.5–15.5)
WBC: 4.5 10*3/uL (ref 4.0–10.5)

## 2017-09-16 LAB — SALICYLATE LEVEL

## 2017-09-16 LAB — ACETAMINOPHEN LEVEL: Acetaminophen (Tylenol), Serum: <10 ug/mL — ABNORMAL LOW (ref 10–30)

## 2017-09-16 LAB — ETHANOL: Alcohol, Ethyl (B): <10 mg/dL (ref <10)

## 2017-09-16 LAB — CK: Total CK: 207 U/L (ref 49–397)

## 2017-09-16 MED ORDER — STERILE WATER FOR INJECTION IJ SOLN
INTRAMUSCULAR | Status: AC
  Administered 2017-09-16: 10 mL
  Filled 2017-09-16: qty 10

## 2017-09-16 MED ORDER — SODIUM CHLORIDE 0.9 % IV BOLUS (SEPSIS)
1000.0000 mL | Freq: Once | INTRAVENOUS | Status: AC
  Administered 2017-09-16: 1000 mL via INTRAVENOUS

## 2017-09-16 MED ORDER — LORAZEPAM 2 MG/ML IJ SOLN
1.0000 mg | Freq: Once | INTRAMUSCULAR | Status: DC
  Filled 2017-09-16: qty 1

## 2017-09-16 MED ORDER — ZIPRASIDONE MESYLATE 20 MG IM SOLR
20.0000 mg | Freq: Once | INTRAMUSCULAR | Status: AC
  Administered 2017-09-16: 20 mg via INTRAMUSCULAR
  Filled 2017-09-16: qty 20

## 2017-09-16 NOTE — ED Provider Notes (Addendum)
802 amI was notified the patient eloped from the emergency department I asked that loft enforcement be called to retrieve patient   Doug SouJacubowitz, Gaylene Moylan, MD 09/16/17 0804  At 8:10 a.m. patient was retrieved by ED staff.  He is alert, moves all extremities.  Speech is nonsensical and uninterpretable.  He moves all extremities motor strength 5/5 overall, somewhat combative.  Four-point restraints ordered for patient as he is felt to be a danger to himself and others.  Patient involuntarily committed by me.   Doug SouJacubowitz, Harriette Tovey, MD 09/16/17 [phone] 9:40 AM patient now awake alert Glasgow Coma Score 15.  He reports that he used crystal meth amphetamine and marijuana last night.  I have asked that he come out of restraints.  He is alert and cooperative and he denies wanting to harm himself or anyone else   Doug SouJacubowitz, Eunice Oldaker, MD 09/16/17 0958   12:40 PM patient alert Glasgow Coma Score 15 ambulates without difficulty.  Again he vehemently denies wanting to harm self or others. He is referred to resource guide for substance abuse Involuntary commitment affidavit that I had filled has been rescinded by me Results for orders placed or performed during the hospital encounter of 09/16/17  Comprehensive metabolic panel  Result Value Ref Range   Sodium 137 135 - 145 mmol/L   Potassium 3.5 3.5 - 5.1 mmol/L   Chloride 105 101 - 111 mmol/L   CO2 25 22 - 32 mmol/L   Glucose, Bld 114 (H) 65 - 99 mg/dL   BUN 15 6 - 20 mg/dL   Creatinine, Ser 1.660.95 0.61 - 1.24 mg/dL   Calcium 9.1 8.9 - 06.310.3 mg/dL   Total Protein 6.9 6.5 - 8.1 g/dL   Albumin 4.1 3.5 - 5.0 g/dL   AST 39 15 - 41 U/L   ALT 74 (H) 17 - 63 U/L   Alkaline Phosphatase 51 38 - 126 U/L   Total Bilirubin 1.1 0.3 - 1.2 mg/dL   GFR calc non Af Amer 49 (L) >60 mL/min   GFR calc Af Amer 56 (L) >60 mL/min   Anion gap 7 5 - 15  CBC  Result Value Ref Range   WBC 4.5 4.0 - 10.5 K/uL   RBC 4.10 (L) 4.22 - 5.81 MIL/uL   Hemoglobin 12.7 (L) 13.0 - 17.0 g/dL   HCT 01.636.4 (L) 01.039.0 - 93.252.0 %   MCV 88.8 78.0 - 100.0 fL   MCH 31.0 26.0 - 34.0 pg   MCHC 34.9 30.0 - 36.0 g/dL   RDW 35.513.1 73.211.5 - 20.215.5 %   Platelets 230 150 - 400 K/uL  Ethanol  Result Value Ref Range   Alcohol, Ethyl (B) <10 <10 mg/dL  Urine rapid drug screen (hosp performed)  Result Value Ref Range   Opiates NONE DETECTED NONE DETECTED   Cocaine POSITIVE (A) NONE DETECTED   Benzodiazepines NONE DETECTED NONE DETECTED   Amphetamines POSITIVE (A) NONE DETECTED   Tetrahydrocannabinol POSITIVE (A) NONE DETECTED   Barbiturates NONE DETECTED NONE DETECTED  Acetaminophen level  Result Value Ref Range   Acetaminophen (Tylenol), Serum <10 (L) 10 - 30 ug/mL  Salicylate level  Result Value Ref Range   Salicylate Lvl <7.0 2.8 - 30.0 mg/dL  CK  Result Value Ref Range   Total CK 207 49 - 397 U/L   Ct Head Wo Contrast  Result Date: 09/16/2017 CLINICAL DATA:  Altered level of consciousness EXAM: CT HEAD WITHOUT CONTRAST TECHNIQUE: Contiguous axial images were obtained from the base of the skull  through the vertex without intravenous contrast. COMPARISON:  None. FINDINGS: Brain: No evidence of acute infarction, hemorrhage, hydrocephalus, extra-axial collection or mass lesion/mass effect. Vascular: No hyperdense vessel or unexpected calcification. Skull: Normal. Negative for fracture or focal lesion. Sinuses/Orbits: No acute finding. Other: None. IMPRESSION: Negative for bleed or other acute intracranial process. Electronically Signed   By: D  HasseCorlis Leakll M.D.   On: 09/16/2017 10:19     Doug SouJacubowitz, Mohamadou Maciver, MD 09/16/17 1249

## 2017-09-16 NOTE — ED Notes (Addendum)
Unable to complete triage due to patient not speaking coherently. Patient does admit to taking meth, weed and mushrooms. Patient is having visual hallucinations and states that he is scared.

## 2017-09-16 NOTE — ED Triage Notes (Addendum)
[facility], found in parking lot of eating asphalt from parking lot. [employer] and EMS called d/t strange behavior. No injury noted moves all extremities equal and strong VS BP: 140/91 HR: 90 , Resp: 18 , SPO2 100% RA , CBG 138 pupils 4mm bilat.

## 2017-09-16 NOTE — Discharge Instructions (Signed)
Get help with your drug problem.  Call any of the numbers on the resource guide to get help.

## 2017-09-16 NOTE — ED Notes (Signed)
Needs to have restraints charged under pt's name when pt's name gets placed into system correctly.
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
## 2017-09-16 NOTE — ED Notes (Signed)
Patient suddenly bolted from room, ran around ED before being redirected to his room. Patient having lucid moments, but is visually hallucinating other times.

## 2017-09-29 ENCOUNTER — Encounter (HOSPITAL_COMMUNITY): Payer: Self-pay

## 2017-09-29 ENCOUNTER — Emergency Department (HOSPITAL_COMMUNITY)
Admission: EM | Admit: 2017-09-29 | Discharge: 2017-09-29 | Disposition: A | Discharge status: Home / Self Care | Payer: Non-veteran care | Attending: Emergency Medicine | Admitting: Emergency Medicine

## 2017-09-29 DIAGNOSIS — F15951 Other stimulant use, unspecified with stimulant-induced psychotic disorder with hallucinations: Secondary | ICD-10-CM | POA: Diagnosis present

## 2017-09-29 DIAGNOSIS — F17210 Nicotine dependence, cigarettes, uncomplicated: Secondary | ICD-10-CM | POA: Insufficient documentation

## 2017-09-29 DIAGNOSIS — F1994 Other psychoactive substance use, unspecified with psychoactive substance-induced mood disorder: Secondary | ICD-10-CM | POA: Diagnosis present

## 2017-09-29 DIAGNOSIS — F329 Major depressive disorder, single episode, unspecified: Secondary | ICD-10-CM | POA: Insufficient documentation

## 2017-09-29 DIAGNOSIS — F15151 Other stimulant abuse with stimulant-induced psychotic disorder with hallucinations: Secondary | ICD-10-CM | POA: Insufficient documentation

## 2017-09-29 DIAGNOSIS — F1414 Cocaine abuse with cocaine-induced mood disorder: Secondary | ICD-10-CM | POA: Insufficient documentation

## 2017-09-29 DIAGNOSIS — Z79899 Other long term (current) drug therapy: Secondary | ICD-10-CM | POA: Insufficient documentation

## 2017-09-29 LAB — COMPREHENSIVE METABOLIC PANEL
ALBUMIN: 4.1 g/dL (ref 3.5–5.0)
ALK PHOS: 53 U/L (ref 38–126)
ALT: 106 U/L — ABNORMAL HIGH (ref 17–63)
AST: 64 U/L — AB (ref 15–41)
Anion gap: 5 (ref 5–15)
BILIRUBIN TOTAL: 0.7 mg/dL (ref 0.3–1.2)
BUN: 17 mg/dL (ref 6–20)
CALCIUM: 8.9 mg/dL (ref 8.9–10.3)
CO2: 29 mmol/L (ref 22–32)
Chloride: 106 mmol/L (ref 101–111)
Creatinine, Ser: 0.67 mg/dL (ref 0.61–1.24)
GFR calc Af Amer: >60 mL/min (ref >60)
GFR calc non Af Amer: >60 mL/min (ref >60)
GLUCOSE: 103 mg/dL — AB (ref 65–99)
Potassium: 4.6 mmol/L (ref 3.5–5.1)
Sodium: 140 mmol/L (ref 135–145)
TOTAL PROTEIN: 6.9 g/dL (ref 6.5–8.1)

## 2017-09-29 LAB — CBC
HEMATOCRIT: 39.7 % (ref 39.0–52.0)
Hemoglobin: 13.3 g/dL (ref 13.0–17.0)
MCH: 31.3 pg (ref 26.0–34.0)
MCHC: 33.5 g/dL (ref 30.0–36.0)
MCV: 93.4 fL (ref 78.0–100.0)
Platelets: 195 10*3/uL (ref 150–400)
RBC: 4.25 MIL/uL (ref 4.22–5.81)
RDW: 13.5 % (ref 11.5–15.5)
WBC: 3.2 10*3/uL — ABNORMAL LOW (ref 4.0–10.5)

## 2017-09-29 LAB — RAPID URINE DRUG SCREEN, HOSP PERFORMED
Amphetamines: POSITIVE — AB
BARBITURATES: NOT DETECTED
Benzodiazepines: NOT DETECTED
Cocaine: NOT DETECTED
Opiates: NOT DETECTED
Tetrahydrocannabinol: POSITIVE — AB

## 2017-09-29 LAB — ACETAMINOPHEN LEVEL: Acetaminophen (Tylenol), Serum: <10 ug/mL — ABNORMAL LOW (ref 10–30)

## 2017-09-29 LAB — SALICYLATE LEVEL: Salicylate Lvl: <7 mg/dL (ref 2.8–30.0)

## 2017-09-29 LAB — ETHANOL

## 2017-09-29 NOTE — Progress Notes (Signed)
Patient ID: Steve [name], male   DOB: 1991-07-28, 28 y.o.   MRN: [id number]  27 yo male who presented to the ED after using meth and having hallucinations.  He slept and is better at this time.  Peer support consult placed.  No suicidal/homicidal ideations, hallucinations, or withdrawal symptoms.  Stable for discharge.  Nanine MeansJamison Lord, PMHNP

## 2017-09-29 NOTE — ED Notes (Signed)
ED Provider at bedside. 
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
## 2017-09-29 NOTE — ED Notes (Signed)
Per TTS:  Pt for overnight observation/further evaluation by psychiatrist in am.

## 2017-09-29 NOTE — ED Notes (Signed)
Bed: WLPT4 Expected date:  Expected time:  Means of arrival:  Comments: 
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
## 2017-09-29 NOTE — ED Notes (Signed)
Patient discharged and given a bus pass.
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
## 2017-09-29 NOTE — ED Notes (Signed)
Pt has three pt belongings' bags that were placed in the cabinet marked, "pt belongings 9-12 hall C."

## 2017-09-29 NOTE — BH Assessment (Signed)
[facility] Assessment Progress Note   Per Nira ConnJason Berry, NP pt is recommended for overnight observation and to be reassessed by psychiatry in the AM. EDP Allyne GeeSanders, Rosezella FloridaLisa M, PA-C and pt's nurse Isaias CowmanAllan, RN have been advised of the disposition.   Princess BruinsAquicha Emidio Warrell, MSW, LCSW Therapeutic Triage Specialist  [phone]

## 2018-04-05 IMAGING — US US ART/VEN ABD/PELV/SCROTUM DOPPLER LTD
1 series · 14 of 25 positions shown · non-contrast
Comparison: Ultrasound right upper quadrant 11/03/2016. CT chest
abdomen and pelvis 02/27/2014.

CLINICAL DATA: Hepatitis.  History of alcohol and meth abuse.

EXAM:
DUPLEX ULTRASOUND OF LIVER
TECHNIQUE: Color and duplex Doppler ultrasound was performed to evaluate the
hepatic in-flow and out-flow vessels.

[Series 1: us art/ven abd/pelv/scrotum doppler ltd · 0.25mm/px · 14 of 38 slices shown]
[im 1/38]
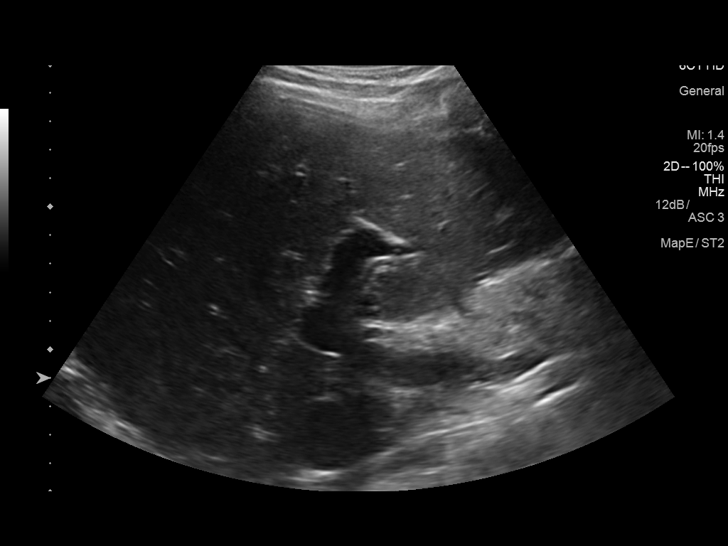
[im 4/38]
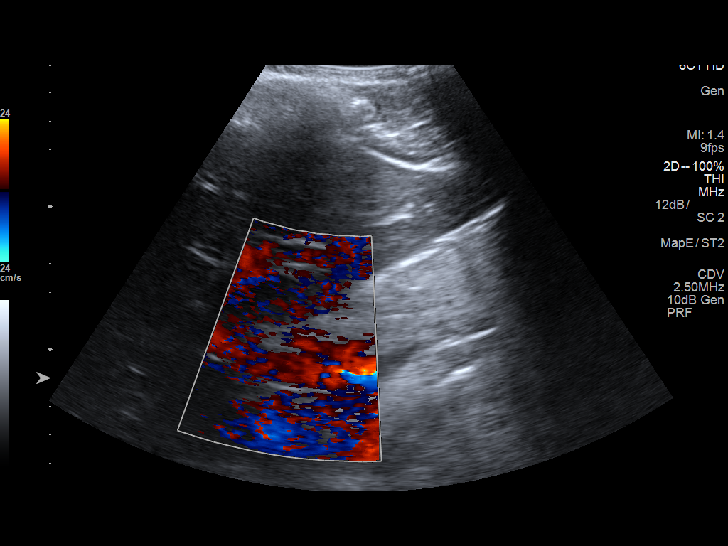
[im 7/38]
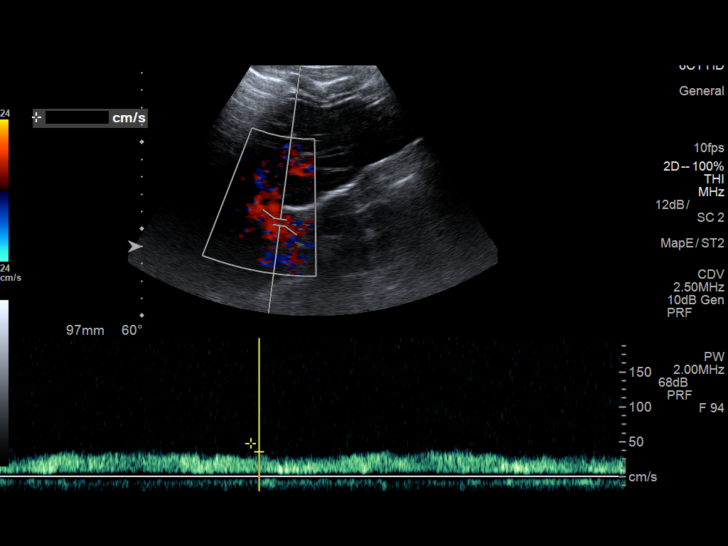
[im 10/38]
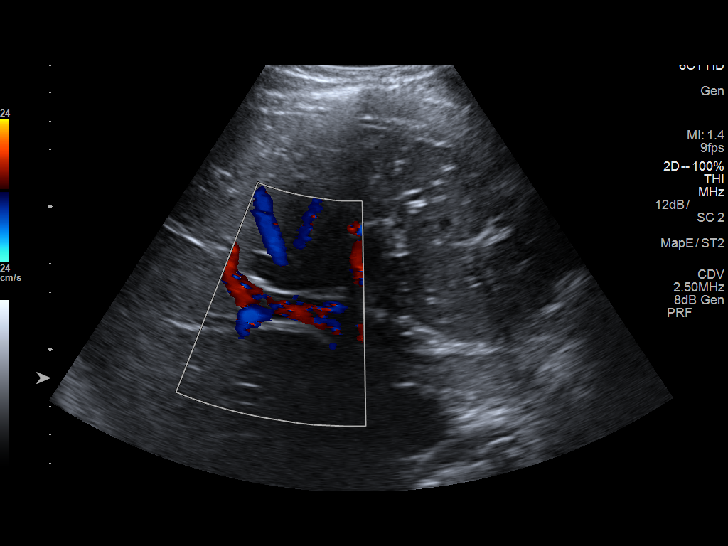
[im 13/38]
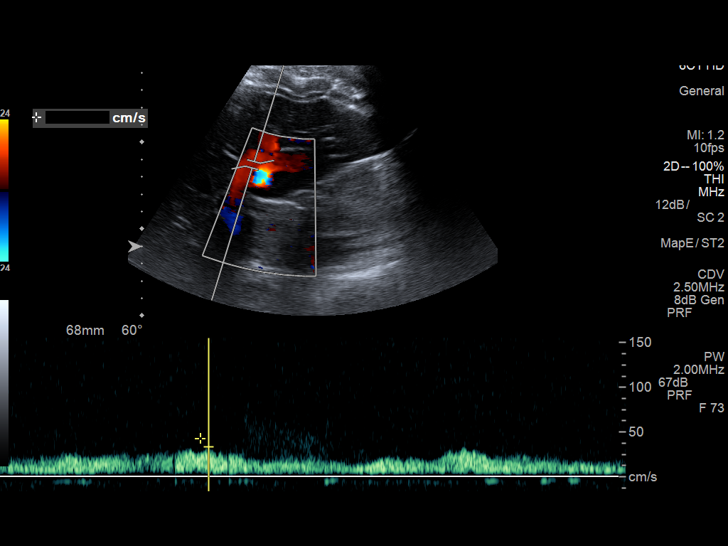
[im 14/38]
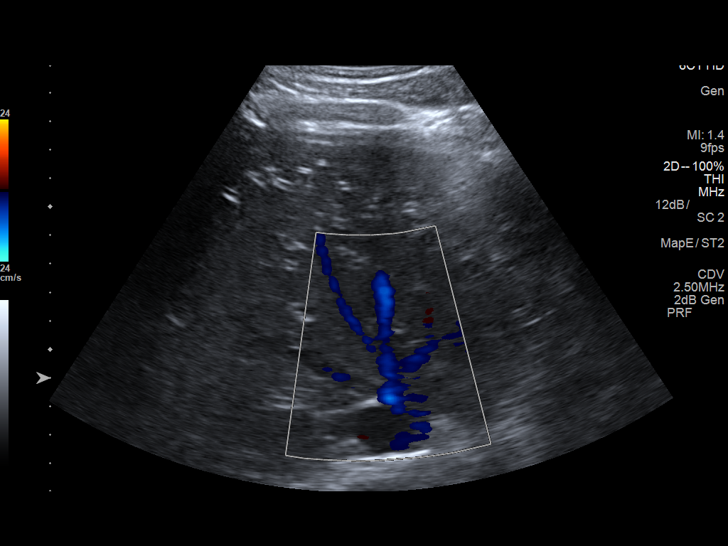
[im 17/38]
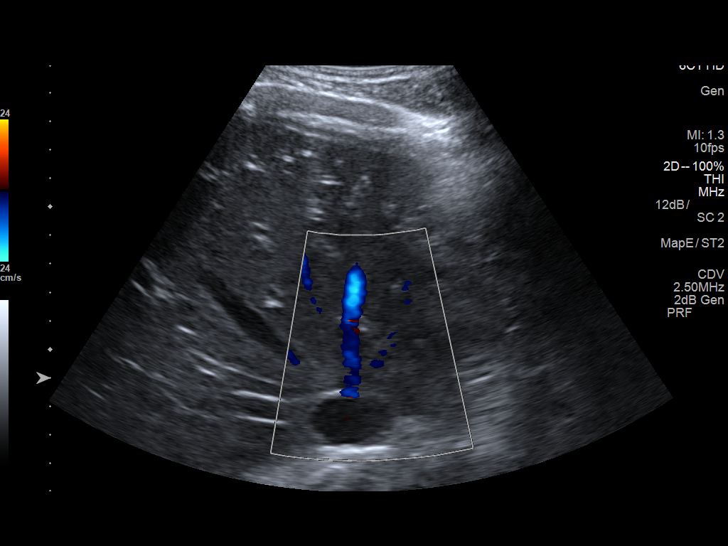
[im 21/38]
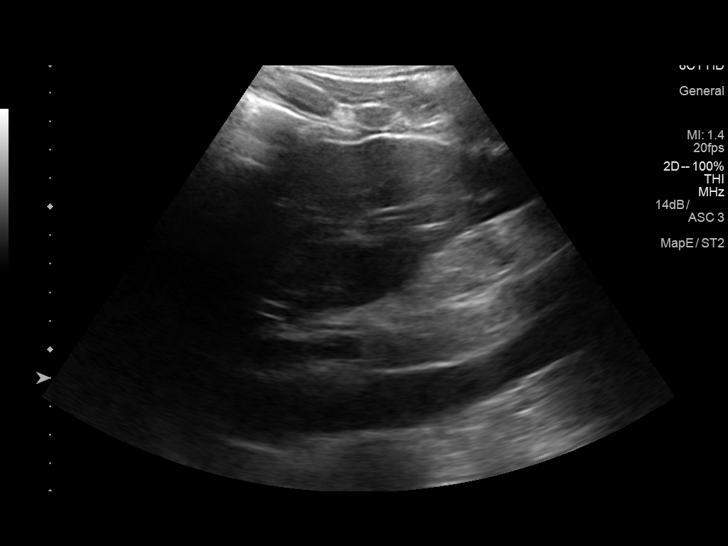
[im 24/38]
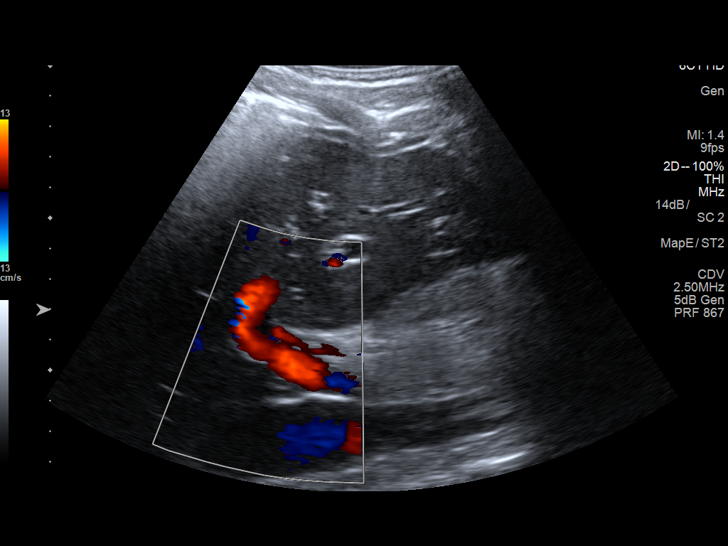
[im 25/38]
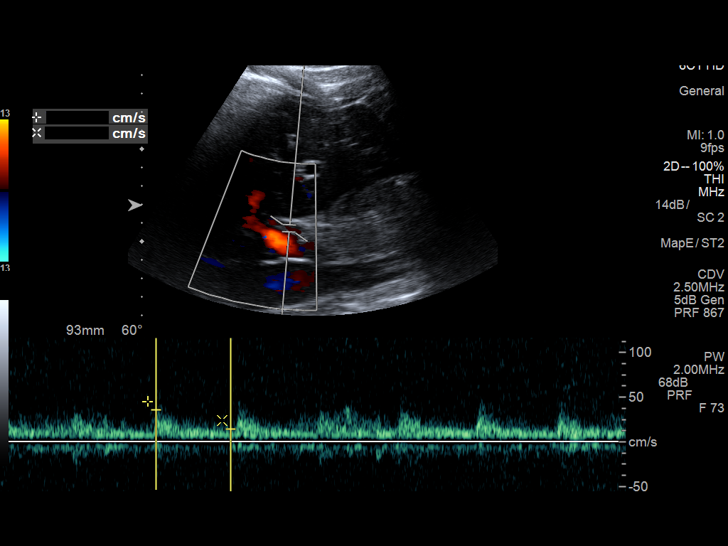
[im 28/38]
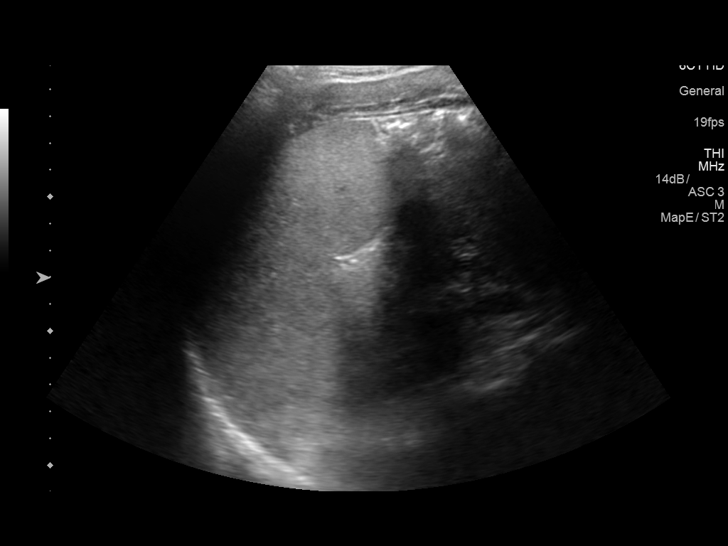
[im 31/38]
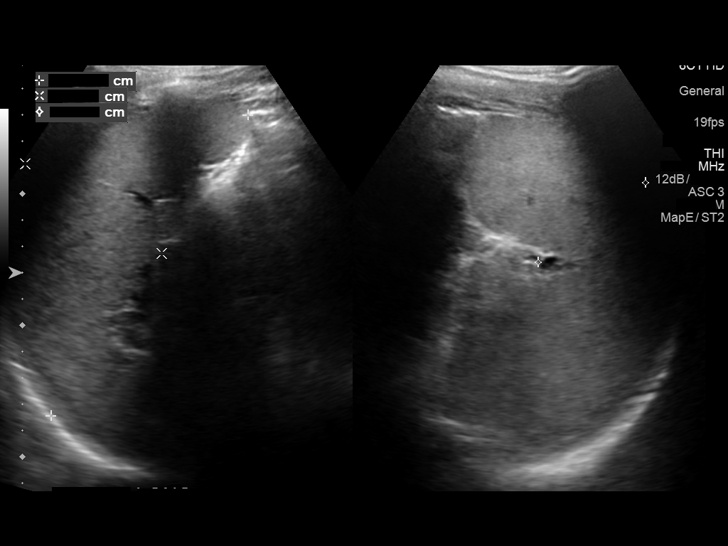
[im 34/38]
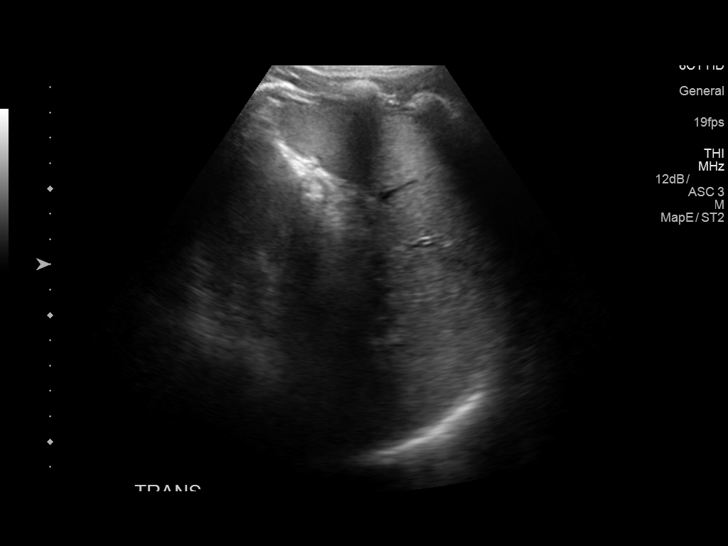
[im 38/38]
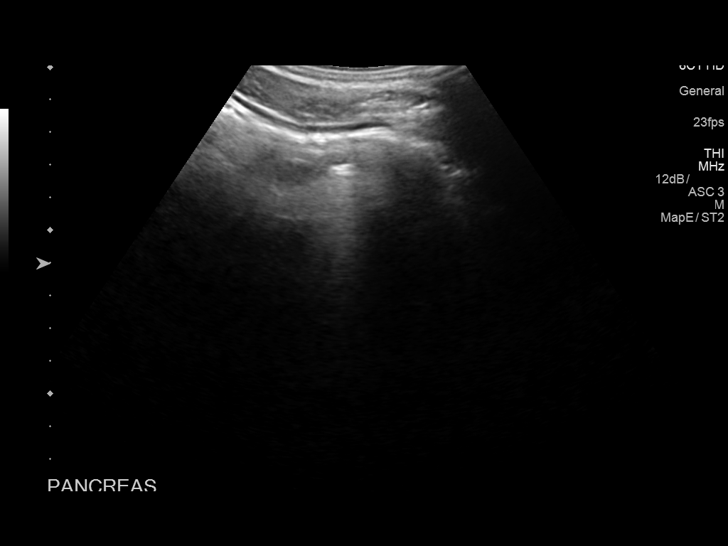

[14 of 25 positions shown; findings below may reference images not displayed]

FINDINGS: Portal Vein Velocities

Main:  43.8 cm/sec

Right:  26.2 cm/sec

Left:  33.4 cm/sec

Hepatic Vein Velocities

Right:  30.2 cm/sec

Middle:  44.5 cm/sec

Left:  30.9 cm/sec

Hepatic Artery Velocity:  35.8 cm/sec

Splenic Vein Velocity:  18.7 cm/sec

Varices: None identified.

Ascites: None identified.

The intrahepatic inferior vena cava is patent.

Spleen size is 13.7 x 6.2 x 5.1 cm.

Portal vein and hepatic vein flow is demonstrated in the appropriate
directions.
IMPRESSION: No evidence of portal venous thrombosis. Normal flow direction in
the hepatic veins and portal veins.

## 2018-04-05 IMAGING — US US ABDOMEN LIMITED
1 series · 14 of 25 positions shown · non-contrast
Comparison: Prior CT from 02/27/2014

CLINICAL DATA: Initial evaluation for elevated LFT is, bilirubin.

EXAM:
US ABDOMEN LIMITED - RIGHT UPPER QUADRANT

[Series 1: us abdomen limited · 0.20mm/px · 14 of 46 slices shown]
[im 1/46]
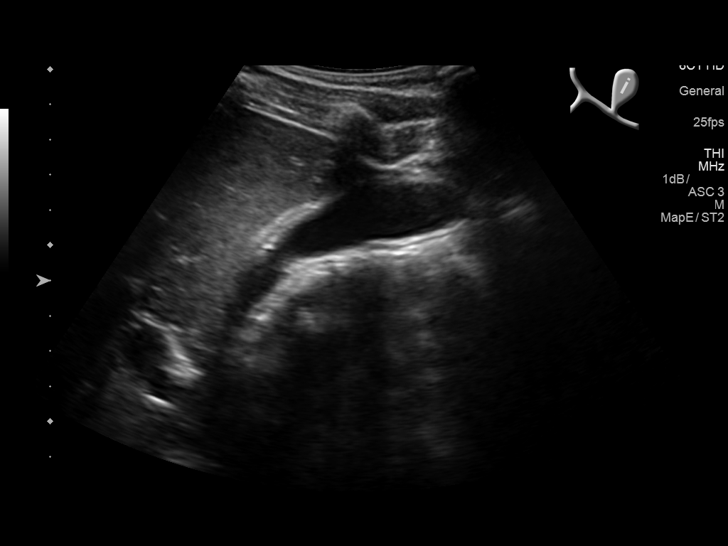
[im 4/46]
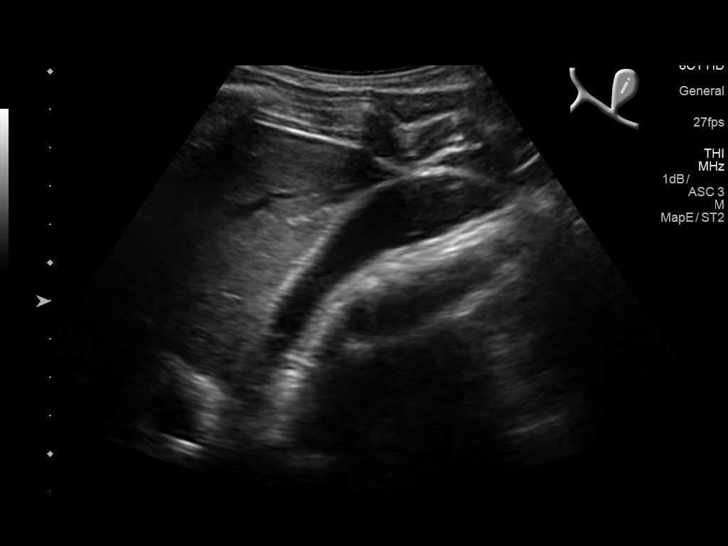
[im 8/46]
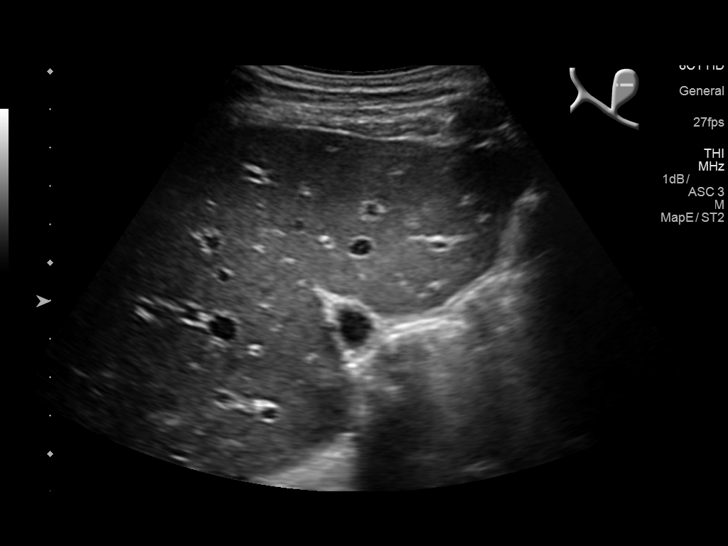
[im 12/46]
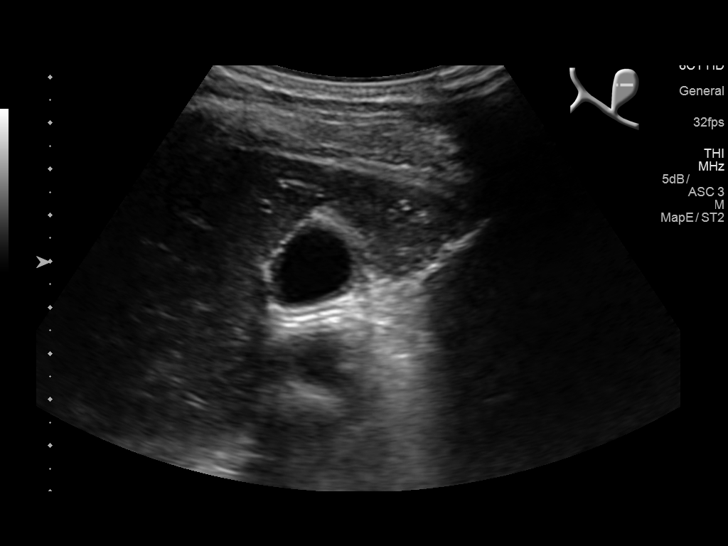
[im 16/46]
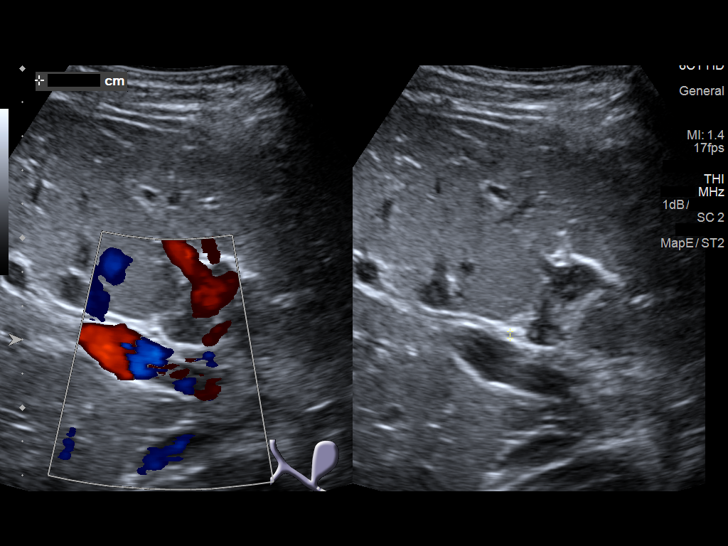
[im 17/46]
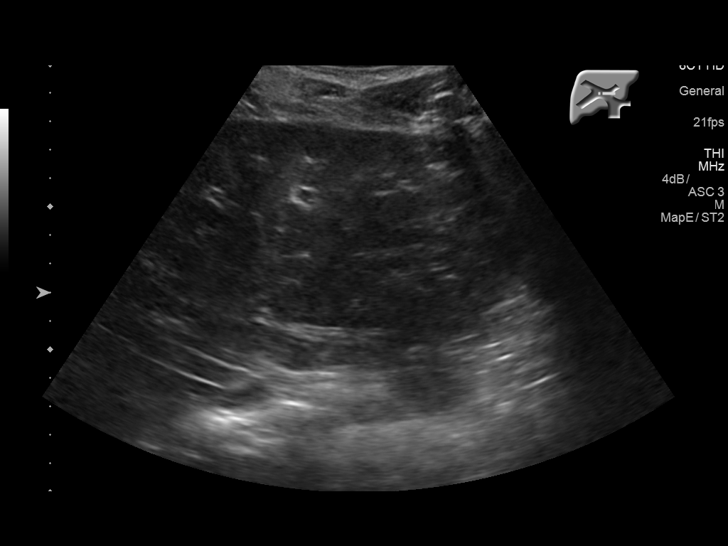
[im 21/46]
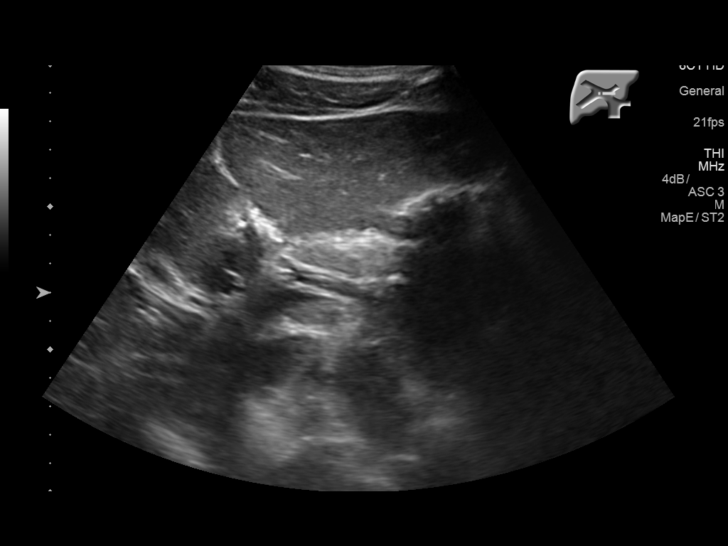
[im 25/46]
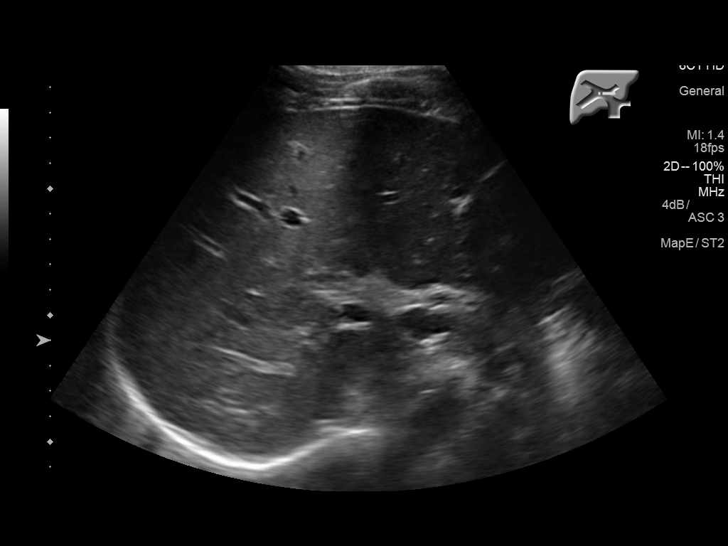
[im 29/46]
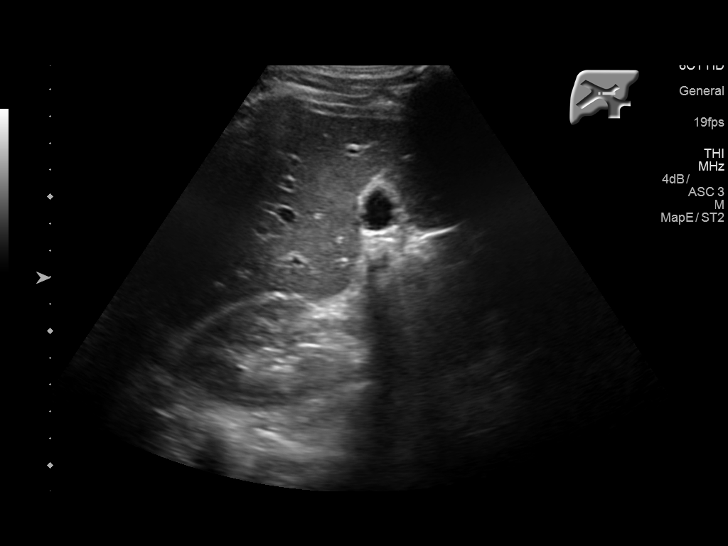
[im 31/46]
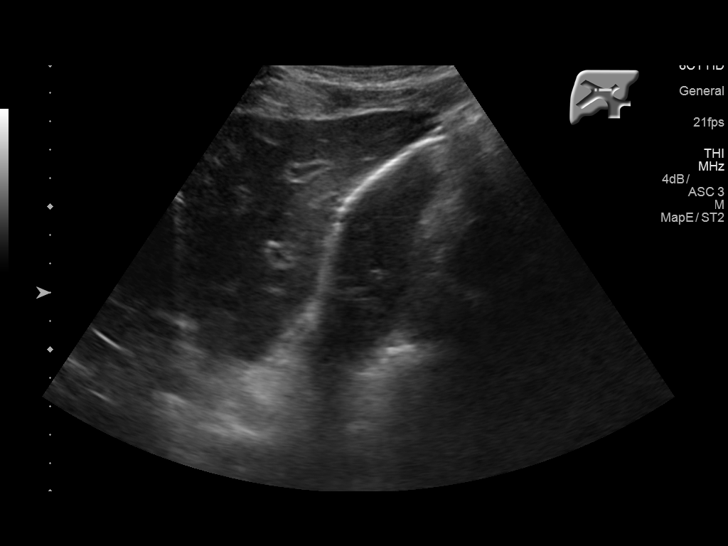
[im 34/46]
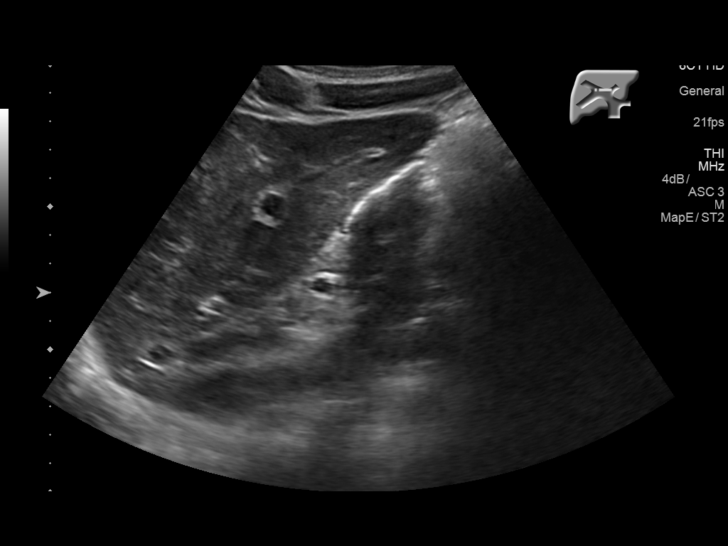
[im 38/46]
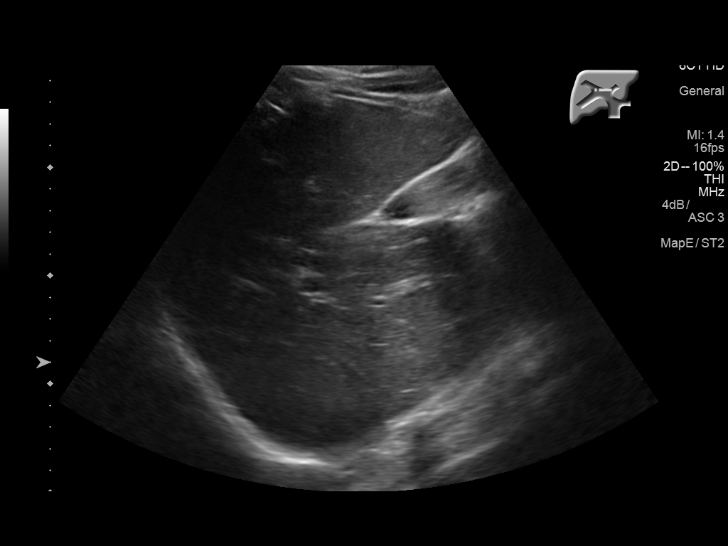
[im 42/46]
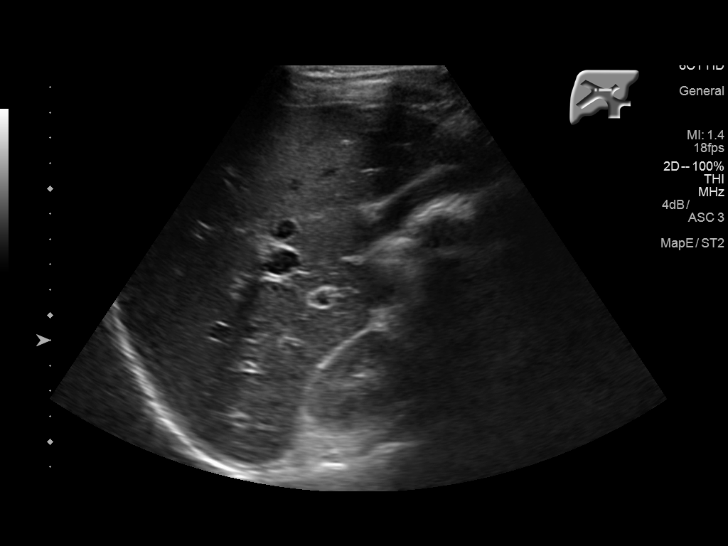
[im 46/46]
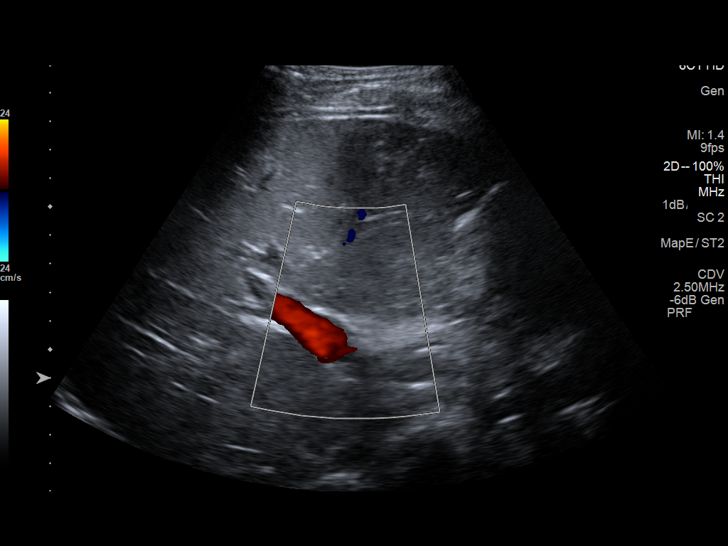

[14 of 25 positions shown; findings below may reference images not displayed]

FINDINGS: Gallbladder:

Gallbladder mildly contracted. No gallstones or wall thickening
visualized. No sonographic Murphy sign noted by sonographer.

Common bile duct:

Diameter: 1.9 mm

Liver:

No focal lesion identified. Within normal limits in parenchymal
echogenicity.
IMPRESSION: Normal right upper quadrant ultrasound.

## 2018-06-14 ENCOUNTER — Emergency Department (HOSPITAL_COMMUNITY)
Admission: EM | Admit: 2018-06-14 | Discharge: 2018-06-15 | Disposition: A | Discharge status: Home / Self Care | Payer: Non-veteran care | Attending: Emergency Medicine | Admitting: Emergency Medicine

## 2018-06-14 ENCOUNTER — Emergency Department (HOSPITAL_COMMUNITY): Payer: Non-veteran care

## 2018-06-14 ENCOUNTER — Encounter (HOSPITAL_COMMUNITY): Payer: Self-pay | Admitting: Emergency Medicine

## 2018-06-14 DIAGNOSIS — F1010 Alcohol abuse, uncomplicated: Secondary | ICD-10-CM | POA: Insufficient documentation

## 2018-06-14 DIAGNOSIS — F17210 Nicotine dependence, cigarettes, uncomplicated: Secondary | ICD-10-CM | POA: Insufficient documentation

## 2018-06-14 DIAGNOSIS — Z79899 Other long term (current) drug therapy: Secondary | ICD-10-CM | POA: Insufficient documentation

## 2018-06-14 DIAGNOSIS — F200 Paranoid schizophrenia: Secondary | ICD-10-CM | POA: Diagnosis not present

## 2018-06-14 DIAGNOSIS — F1910 Other psychoactive substance abuse, uncomplicated: Secondary | ICD-10-CM | POA: Insufficient documentation

## 2018-06-14 DIAGNOSIS — F1410 Cocaine abuse, uncomplicated: Secondary | ICD-10-CM | POA: Diagnosis not present

## 2018-06-14 DIAGNOSIS — R4182 Altered mental status, unspecified: Secondary | ICD-10-CM | POA: Diagnosis present

## 2018-06-14 DIAGNOSIS — F419 Anxiety disorder, unspecified: Secondary | ICD-10-CM | POA: Insufficient documentation

## 2018-06-14 LAB — COMPREHENSIVE METABOLIC PANEL
ALK PHOS: 43 U/L (ref 38–126)
ALT: 63 U/L — ABNORMAL HIGH (ref 0–44)
AST: 63 U/L — ABNORMAL HIGH (ref 15–41)
Albumin: 4 g/dL (ref 3.5–5.0)
Anion gap: 8 (ref 5–15)
BUN: 14 mg/dL (ref 6–20)
CALCIUM: 9.1 mg/dL (ref 8.9–10.3)
CO2: 25 mmol/L (ref 22–32)
Chloride: 107 mmol/L (ref 98–111)
Creatinine, Ser: 1.22 mg/dL (ref 0.61–1.24)
GLUCOSE: 84 mg/dL (ref 70–99)
Potassium: 3.2 mmol/L — ABNORMAL LOW (ref 3.5–5.1)
Sodium: 140 mmol/L (ref 135–145)
TOTAL PROTEIN: 6.9 g/dL (ref 6.5–8.1)
Total Bilirubin: 2.5 mg/dL — ABNORMAL HIGH (ref 0.3–1.2)

## 2018-06-14 LAB — CBC
HEMATOCRIT: 35.8 % — AB (ref 39.0–52.0)
HEMOGLOBIN: 12.9 g/dL — AB (ref 13.0–17.0)
MCH: 31.6 pg (ref 26.0–34.0)
MCHC: 36 g/dL (ref 30.0–36.0)
MCV: 87.7 fL (ref 78.0–100.0)
Platelets: 169 10*3/uL (ref 150–400)
RBC: 4.08 MIL/uL — ABNORMAL LOW (ref 4.22–5.81)
RDW: 12.4 % (ref 11.5–15.5)
WBC: 6.5 10*3/uL (ref 4.0–10.5)

## 2018-06-14 LAB — CBG MONITORING, ED: Glucose-Capillary: 67 mg/dL — ABNORMAL LOW (ref 70–99)

## 2018-06-14 LAB — ETHANOL

## 2018-06-14 MED ORDER — ALUM & MAG HYDROXIDE-SIMETH 200-200-20 MG/5ML PO SUSP: 30.0000 mL | Freq: Four times a day (QID) | ORAL | Status: DC | PRN

## 2018-06-14 MED ORDER — ACETAMINOPHEN 325 MG PO TABS: 650.0000 mg | ORAL_TABLET | ORAL | Status: DC | PRN

## 2018-06-14 MED ORDER — HALOPERIDOL LACTATE 5 MG/ML IJ SOLN
5.0000 mg | Freq: Once | INTRAMUSCULAR | Status: DC
Start: 2018-06-14 — End: 2018-06-14

## 2018-06-14 MED ORDER — STERILE WATER FOR INJECTION IJ SOLN
INTRAMUSCULAR | Status: AC
  Filled 2018-06-14: qty 10

## 2018-06-14 MED ORDER — ONDANSETRON HCL 4 MG PO TABS: 4.0000 mg | ORAL_TABLET | Freq: Three times a day (TID) | ORAL | Status: DC | PRN

## 2018-06-14 MED ORDER — OLANZAPINE 10 MG IM SOLR
10.0000 mg | Freq: Once | INTRAMUSCULAR | Status: AC
  Administered 2018-06-14: 10 mg via INTRAMUSCULAR
  Filled 2018-06-14 (×2): qty 10

## 2018-06-14 MED ORDER — NICOTINE 21 MG/24HR TD PT24: 21.0000 mg | MEDICATED_PATCH | Freq: Every day | TRANSDERMAL | Status: DC
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
## 2018-06-14 NOTE — ED Triage Notes (Signed)
Pt to ED with EMS stating that pt was found to be combative and acting erratically - [employer] was on scene first -- had pt cuffed when EMS arrived.  Pt received Ketamine 400mg  IM in right thigh - at 1343 Versed 2.5mg  given IV at 1408   On arrival pt isw confused, remains handcuffed- changed to soft restraints- nonverbal at this time. Does not follow commands presently.  IV 18G left lateral forearm. NS 500cc bolus

## 2018-06-14 NOTE — ED Notes (Signed)
Pt awake, arousable to name- able to state name, unable to answer any other questions.  Nystagmus present, remains in 4 point soft restraints for safety.

## 2018-06-15 ENCOUNTER — Other Ambulatory Visit: Payer: Self-pay

## 2018-06-15 NOTE — ED Notes (Signed)
Pt voiced understanding and agreement w/tx plan - Inpt.

## 2018-06-15 NOTE — ED Notes (Signed)
breakfest tray ordered 

## 2018-06-15 NOTE — ED Notes (Signed)
Pt and girlfriend request for pt to be d/c'd. Girlfriend advised pt is at baseline and that he lives w/her, her mother, and her child. Dr Adela Lank aware - recommend to d/c. Pt and girlfriend voiced understanding of following up. Blue paper scrubs given as pt did not have any belongings in ED.

## 2018-06-15 NOTE — ED Notes (Signed)
Pt ambulated in pod D hallway, tolerated well but was unsteady at first. Pt stated he is unable to provide urine sample at this time. Pt requesting to have some water.

## 2018-06-15 NOTE — BH Assessment (Signed)
[facility] Assessment Progress Note  Pt reassessed today. Pt denies SI, HI AVH. He says he is feeling better today. Pt admits to still having some paranoia ("kinda, but not really"). Pt is cooperative, but slow to respond and speaks with a low mumble. Pt admits that he was having hallucinations when he first arrived at the ED, but denies that he used drugs at all yesterday. Pt will continue to be recommended for IP treatment, at this time.   Johny Shock. Ladona Ridgel, MS, NCC, LPCA Counselor

## 2018-06-15 NOTE — ED Provider Notes (Signed)
27 yo M with a chief complaint of hallucinations and agitated delirium.  The patient had received ketamine and olanzapine.  On my assessment 16 hours later the patient is now back to his baseline.  He is unsure exactly what happened.  He denies suicidal or homicidal ideation no longer has hallucinations is able to ambulate and tolerate p.o. without difficulty will discharge home.   Melene Plan, DO 06/15/18 [phone]

## 2018-06-15 NOTE — ED Notes (Signed)
Pt eating lunch. Girlfriend visiting - voiced understanding of visitation times.

## 2018-06-15 NOTE — ED Notes (Signed)
Pt on phone at nurses' desk - states is calling his girlfriend.
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
## 2018-06-15 NOTE — ED Notes (Signed)
All restraints released, pt resting on stretcher with eyes closed, RR even and unlabored, NAD.

## 2018-06-15 NOTE — Progress Notes (Signed)
Referral info faxed to the following:  [facility]   CSW will follow-up.     Roselyn Bering, MSW, LCSW Clinical Social Work

## 2018-06-15 NOTE — ED Notes (Signed)
Pt awake and alert, recalls very little details about events last evening. Pt provided beverage. Cooperative at this time

## 2018-08-05 ENCOUNTER — Encounter (HOSPITAL_COMMUNITY): Payer: Self-pay

## 2018-08-05 ENCOUNTER — Emergency Department (HOSPITAL_COMMUNITY)
Admission: EM | Admit: 2018-08-05 | Discharge: 2018-08-05 | Disposition: A | Discharge status: Home / Self Care | Payer: Non-veteran care | Attending: Emergency Medicine | Admitting: Emergency Medicine

## 2018-08-05 DIAGNOSIS — Z79899 Other long term (current) drug therapy: Secondary | ICD-10-CM | POA: Diagnosis not present

## 2018-08-05 DIAGNOSIS — F200 Paranoid schizophrenia: Secondary | ICD-10-CM | POA: Insufficient documentation

## 2018-08-05 DIAGNOSIS — F1910 Other psychoactive substance abuse, uncomplicated: Secondary | ICD-10-CM | POA: Diagnosis not present

## 2018-08-05 DIAGNOSIS — F17210 Nicotine dependence, cigarettes, uncomplicated: Secondary | ICD-10-CM | POA: Diagnosis not present

## 2018-08-05 DIAGNOSIS — F419 Anxiety disorder, unspecified: Secondary | ICD-10-CM | POA: Diagnosis not present
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
## 2018-08-05 NOTE — ED Notes (Signed)
Pt states he took crystal meth, weed, cigarette.  Pt alert, oriented.  Pt answering most questions with intermittent unintelligible monitoring.

## 2018-08-05 NOTE — ED Notes (Signed)
Bed: [facility] Expected date:  Expected time:  Means of arrival:  Comments: 

## 2018-08-05 NOTE — ED Notes (Signed)
Pt medically cleared by MD.  Preparing for DC.

## 2018-09-30 ENCOUNTER — Emergency Department (HOSPITAL_COMMUNITY): Payer: Non-veteran care

## 2018-09-30 ENCOUNTER — Other Ambulatory Visit: Payer: Self-pay

## 2018-09-30 ENCOUNTER — Encounter (HOSPITAL_COMMUNITY): Payer: Self-pay | Admitting: Emergency Medicine

## 2018-09-30 ENCOUNTER — Emergency Department (HOSPITAL_COMMUNITY)
Admission: EM | Admit: 2018-09-30 | Discharge: 2018-09-30 | Disposition: A | Discharge status: Home / Self Care | Payer: Non-veteran care | Attending: Emergency Medicine | Admitting: Emergency Medicine

## 2018-09-30 DIAGNOSIS — W19XXXA Unspecified fall, initial encounter: Secondary | ICD-10-CM

## 2018-09-30 DIAGNOSIS — F1510 Other stimulant abuse, uncomplicated: Secondary | ICD-10-CM

## 2018-09-30 DIAGNOSIS — F1520 Other stimulant dependence, uncomplicated: Secondary | ICD-10-CM | POA: Insufficient documentation

## 2018-09-30 DIAGNOSIS — R456 Violent behavior: Secondary | ICD-10-CM | POA: Insufficient documentation

## 2018-09-30 DIAGNOSIS — F17210 Nicotine dependence, cigarettes, uncomplicated: Secondary | ICD-10-CM | POA: Insufficient documentation

## 2018-09-30 LAB — CBC WITH DIFFERENTIAL/PLATELET
ABS IMMATURE GRANULOCYTES: 0.02 10*3/uL (ref 0.00–0.07)
BASOS ABS: 0 10*3/uL (ref 0.0–0.1)
Basophils Relative: 0 %
Eosinophils Absolute: 0 10*3/uL (ref 0.0–0.5)
Eosinophils Relative: 0 %
HCT: 38.1 % — ABNORMAL LOW (ref 39.0–52.0)
HEMOGLOBIN: 13.2 g/dL (ref 13.0–17.0)
IMMATURE GRANULOCYTES: 0 %
LYMPHS PCT: 19 %
Lymphs Abs: 1.2 10*3/uL (ref 0.7–4.0)
MCH: 32 pg (ref 26.0–34.0)
MCHC: 34.6 g/dL (ref 30.0–36.0)
MCV: 92.3 fL (ref 80.0–100.0)
MONO ABS: 0.5 10*3/uL (ref 0.1–1.0)
Monocytes Relative: 8 %
NEUTROS ABS: 4.5 10*3/uL (ref 1.7–7.7)
NEUTROS PCT: 73 %
NRBC: 0 % (ref 0.0–0.2)
Platelets: 219 10*3/uL (ref 150–400)
RBC: 4.13 MIL/uL — ABNORMAL LOW (ref 4.22–5.81)
RDW: 13 % (ref 11.5–15.5)
WBC: 6.2 10*3/uL (ref 4.0–10.5)

## 2018-09-30 LAB — COMPREHENSIVE METABOLIC PANEL
ALBUMIN: 4.5 g/dL (ref 3.5–5.0)
ALK PHOS: 52 U/L (ref 38–126)
ALT: 79 U/L — AB (ref 0–44)
AST: 43 U/L — AB (ref 15–41)
Anion gap: 13 (ref 5–15)
BUN: 11 mg/dL (ref 6–20)
CHLORIDE: 103 mmol/L (ref 98–111)
CO2: 24 mmol/L (ref 22–32)
Calcium: 9.8 mg/dL (ref 8.9–10.3)
Creatinine, Ser: 1.27 mg/dL — ABNORMAL HIGH (ref 0.61–1.24)
GFR calc Af Amer: >60 mL/min (ref >60)
GFR calc non Af Amer: >60 mL/min (ref >60)
Glucose, Bld: 75 mg/dL (ref 70–99)
Potassium: 4.2 mmol/L (ref 3.5–5.1)
Sodium: 140 mmol/L (ref 135–145)
Total Bilirubin: 1.8 mg/dL — ABNORMAL HIGH (ref 0.3–1.2)
Total Protein: 7.9 g/dL (ref 6.5–8.1)

## 2018-09-30 LAB — CBG MONITORING, ED: Glucose-Capillary: 72 mg/dL (ref 70–99)

## 2018-09-30 LAB — SALICYLATE LEVEL

## 2018-09-30 LAB — ETHANOL: Alcohol, Ethyl (B): <10 mg/dL (ref <10)

## 2018-09-30 LAB — ACETAMINOPHEN LEVEL: Acetaminophen (Tylenol), Serum: <10 ug/mL — ABNORMAL LOW (ref 10–30)

## 2018-09-30 MED ORDER — SODIUM CHLORIDE 0.9 % IV BOLUS
1000.0000 mL | Freq: Once | INTRAVENOUS | Status: AC
  Administered 2018-09-30: 1000 mL via INTRAVENOUS

## 2018-09-30 MED ORDER — HALOPERIDOL LACTATE 5 MG/ML IJ SOLN
5.0000 mg | Freq: Once | INTRAMUSCULAR | Status: AC
  Administered 2018-09-30: 5 mg via INTRAVENOUS

## 2018-09-30 MED ORDER — HALOPERIDOL LACTATE 5 MG/ML IJ SOLN
INTRAMUSCULAR | Status: AC
  Administered 2018-09-30: 5 mg via INTRAVENOUS
  Filled 2018-09-30: qty 1

## 2018-09-30 NOTE — ED Provider Notes (Signed)
28 yo M with a chief complaint of altered mental status.  The patient had reportedly been doing methamphetamines and showed up in the emergency department hallucinating and was aggressive and so was chemically sedated.  I received the patient in signout.  The plan is to re-eval the patient once he is sober.  I was notified by nursing that the patient was now awake and alert and conversant.  I had a conversation with him and he denies suicidal or homicidal ideation.  Denies hallucinations.  He admitted to using drugs earlier today.  I will give him a list of rehab facilities.  Offered for him to return at any time which she wishes to.  Will discharge home.   Melene Plan, DO 09/30/18 1832

## 2018-09-30 NOTE — ED Triage Notes (Signed)
Pt here via [facility] with [employer], pt reportedly overdosed on meth according to girlfriend, [employer] received a call about him bleeding profusely and dancing in the street, when [employer] put cuffs on him on the ground he began banging his head on the concrete.  Pt making a "growling" noise.

## 2018-09-30 NOTE — ED Notes (Signed)
Sitter at bedside.

## 2018-09-30 NOTE — ED Notes (Signed)
Pt aware that a urine sample is needed and will provide one when able. Pt calm and cooperative at this time. Girlfriend and sitter at bed side.

## 2018-09-30 NOTE — ED Notes (Signed)
Discharge instructions discussed with Pt. Pt verbalized understanding. Pt stable and ambulatory and leaving with gf.

## 2018-09-30 NOTE — Discharge Instructions (Signed)
Return for the ED for any sudden concerns.  Follow up with a family doc.
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
## 2019-07-20 ENCOUNTER — Emergency Department (HOSPITAL_COMMUNITY)
Admission: EM | Admit: 2019-07-20 | Discharge: 2019-07-20 | Disposition: A | Discharge status: Home / Self Care | Payer: Non-veteran care | Attending: Emergency Medicine | Admitting: Emergency Medicine

## 2019-07-20 ENCOUNTER — Encounter (HOSPITAL_COMMUNITY): Payer: Self-pay | Admitting: Emergency Medicine

## 2019-07-20 DIAGNOSIS — F1210 Cannabis abuse, uncomplicated: Secondary | ICD-10-CM | POA: Insufficient documentation

## 2019-07-20 DIAGNOSIS — F99 Mental disorder, not otherwise specified: Secondary | ICD-10-CM | POA: Insufficient documentation

## 2019-07-20 DIAGNOSIS — F17210 Nicotine dependence, cigarettes, uncomplicated: Secondary | ICD-10-CM | POA: Insufficient documentation

## 2019-07-20 DIAGNOSIS — T50905A Adverse effect of unspecified drugs, medicaments and biological substances, initial encounter: Secondary | ICD-10-CM | POA: Insufficient documentation

## 2019-07-20 DIAGNOSIS — F1910 Other psychoactive substance abuse, uncomplicated: Secondary | ICD-10-CM | POA: Insufficient documentation

## 2019-07-20 DIAGNOSIS — R410 Disorientation, unspecified: Secondary | ICD-10-CM | POA: Insufficient documentation

## 2019-07-20 LAB — URINALYSIS, ROUTINE W REFLEX MICROSCOPIC
Bilirubin Urine: NEGATIVE
Glucose, UA: NEGATIVE mg/dL
Hgb urine dipstick: NEGATIVE
Ketones, ur: NEGATIVE mg/dL
Leukocytes,Ua: NEGATIVE
Nitrite: NEGATIVE
Protein, ur: NEGATIVE mg/dL
Specific Gravity, Urine: 1.019 (ref 1.005–1.030)
pH: 7 (ref 5.0–8.0)

## 2019-07-20 LAB — RAPID URINE DRUG SCREEN, HOSP PERFORMED
Amphetamines: POSITIVE — AB
Barbiturates: NOT DETECTED
Benzodiazepines: NOT DETECTED
Cocaine: NOT DETECTED
Opiates: NOT DETECTED
Tetrahydrocannabinol: POSITIVE — AB

## 2019-07-20 LAB — ETHANOL: Alcohol, Ethyl (B): <10 mg/dL (ref <10)

## 2019-07-20 NOTE — ED Notes (Signed)
Pt asked to use urinal , pt uncuffed by the officer proceded to use urinal , filled that urinal up given a second urinal and finished urinating upon using the second urinal , pt then dumped the urine upon his head , pt cleaned up and re cuffed by officers

## 2019-07-20 NOTE — ED Triage Notes (Signed)
Pt here from the jail after for medical clearance , pt received narcan without  change in mental status , pt has long history of substance abuse

## 2019-07-20 NOTE — Discharge Instructions (Signed)
1.  Use the resource guide to seek help with drug abuse as soon as possible. 2.  See your mental health provider and your family doctor as soon as possible for recheck.
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
## 2019-08-11 LAB — CBC
HCT: 45.3 % (ref 39.0–52.0)
Hemoglobin: 15.1 g/dL (ref 13.0–17.0)
MCH: 31.5 pg (ref 26.0–34.0)
MCHC: 33.3 g/dL (ref 30.0–36.0)
MCV: 94.6 fL (ref 80.0–100.0)
Platelets: 193 10*3/uL (ref 150–400)
RBC: 4.79 MIL/uL (ref 4.22–5.81)
RDW: 12.8 % (ref 11.5–15.5)
WBC: 4.6 10*3/uL (ref 4.0–10.5)
nRBC: 0 % (ref 0.0–0.2)

## 2019-08-11 LAB — COMPREHENSIVE METABOLIC PANEL
ALT: 90 U/L — ABNORMAL HIGH (ref 0–44)
AST: 52 U/L — ABNORMAL HIGH (ref 15–41)
Albumin: 4 g/dL (ref 3.5–5.0)
Alkaline Phosphatase: 59 U/L (ref 38–126)
Anion gap: 12 (ref 5–15)
BUN: 18 mg/dL (ref 6–20)
CO2: 20 mmol/L — ABNORMAL LOW (ref 22–32)
Calcium: 9.1 mg/dL (ref 8.9–10.3)
Chloride: 102 mmol/L (ref 98–111)
Creatinine, Ser: 1.08 mg/dL (ref 0.61–1.24)
GFR calc Af Amer: >60 mL/min (ref >60)
GFR calc non Af Amer: >60 mL/min (ref >60)
Glucose, Bld: 71 mg/dL (ref 70–99)
Potassium: 4.4 mmol/L (ref 3.5–5.1)
Sodium: 134 mmol/L — ABNORMAL LOW (ref 135–145)
Total Bilirubin: 0.7 mg/dL (ref 0.3–1.2)
Total Protein: 7.3 g/dL (ref 6.5–8.1)

## 2019-09-14 ENCOUNTER — Emergency Department (HOSPITAL_COMMUNITY)
Admission: EM | Admit: 2019-09-14 | Discharge: 2019-09-14 | Payer: Non-veteran care | Attending: Emergency Medicine | Admitting: Emergency Medicine

## 2019-09-14 DIAGNOSIS — Z008 Encounter for other general examination: Secondary | ICD-10-CM

## 2019-09-14 DIAGNOSIS — Z20828 Contact with and (suspected) exposure to other viral communicable diseases: Secondary | ICD-10-CM | POA: Insufficient documentation

## 2019-09-14 DIAGNOSIS — F17210 Nicotine dependence, cigarettes, uncomplicated: Secondary | ICD-10-CM | POA: Insufficient documentation

## 2019-09-14 LAB — POC SARS CORONAVIRUS 2 AG -  ED: SARS Coronavirus 2 Ag: NEGATIVE
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
## 2019-09-14 NOTE — ED Notes (Signed)
Attempting to triage pt but pt not responding.

## 2019-09-14 NOTE — ED Triage Notes (Addendum)
Patient brought in by [employer]. [employer] states patient was actively breaking into a business and being arrested. When patient got to jail patient would not speak to anyone. Per [employer], nurse at jail stated this is not patient's typical behavior and that patient is typically a "chatter box". Nurse at jail stated to [employer] that since this isn't patient's usual behavior,  patient must be medically cleared before he can be processed into jail. Patient will not respond to questions but does present with a dry non-productive cough.

## 2019-09-14 NOTE — ED Notes (Signed)
Patient provided with breakfast tray.

## 2019-09-14 NOTE — ED Triage Notes (Signed)
BIB [employer] to be evaluated that there no medical issue for not be detain in jail, patient was not talking at the jail

## 2019-10-06 DIAGNOSIS — Z1231 Encounter for screening mammogram for malignant neoplasm of breast: Secondary | ICD-10-CM

## 2020-03-30 ENCOUNTER — Encounter (HOSPITAL_COMMUNITY): Payer: Self-pay | Admitting: Emergency Medicine

## 2020-03-30 ENCOUNTER — Other Ambulatory Visit: Payer: Self-pay

## 2020-03-30 ENCOUNTER — Emergency Department (HOSPITAL_COMMUNITY)
Admission: EM | Admit: 2020-03-30 | Discharge: 2020-03-31 | Disposition: A | Discharge status: Home / Self Care | Payer: No Typology Code available for payment source | Attending: Emergency Medicine | Admitting: Emergency Medicine

## 2020-03-30 DIAGNOSIS — F209 Schizophrenia, unspecified: Secondary | ICD-10-CM | POA: Diagnosis not present

## 2020-03-30 DIAGNOSIS — F15951 Other stimulant use, unspecified with stimulant-induced psychotic disorder with hallucinations: Secondary | ICD-10-CM | POA: Diagnosis present

## 2020-03-30 DIAGNOSIS — D649 Anemia, unspecified: Secondary | ICD-10-CM | POA: Insufficient documentation

## 2020-03-30 DIAGNOSIS — Z20822 Contact with and (suspected) exposure to covid-19: Secondary | ICD-10-CM | POA: Diagnosis not present

## 2020-03-30 DIAGNOSIS — F1410 Cocaine abuse, uncomplicated: Secondary | ICD-10-CM

## 2020-03-30 DIAGNOSIS — F1994 Other psychoactive substance use, unspecified with psychoactive substance-induced mood disorder: Secondary | ICD-10-CM | POA: Diagnosis present

## 2020-03-30 DIAGNOSIS — F17210 Nicotine dependence, cigarettes, uncomplicated: Secondary | ICD-10-CM | POA: Insufficient documentation

## 2020-03-30 DIAGNOSIS — F99 Mental disorder, not otherwise specified: Secondary | ICD-10-CM | POA: Diagnosis present

## 2020-03-30 LAB — COMPREHENSIVE METABOLIC PANEL
ALT: 46 U/L — ABNORMAL HIGH (ref 0–44)
AST: 39 U/L (ref 15–41)
Albumin: 4.1 g/dL (ref 3.5–5.0)
Alkaline Phosphatase: 73 U/L (ref 38–126)
Anion gap: 9 (ref 5–15)
BUN: 21 mg/dL — ABNORMAL HIGH (ref 6–20)
CO2: 27 mmol/L (ref 22–32)
Calcium: 9.4 mg/dL (ref 8.9–10.3)
Chloride: 102 mmol/L (ref 98–111)
Creatinine, Ser: 1.1 mg/dL (ref 0.61–1.24)
GFR calc Af Amer: >60 mL/min (ref >60)
GFR calc non Af Amer: >60 mL/min (ref >60)
Glucose, Bld: 84 mg/dL (ref 70–99)
Potassium: 3.8 mmol/L (ref 3.5–5.1)
Sodium: 138 mmol/L (ref 135–145)
Total Bilirubin: 1.9 mg/dL — ABNORMAL HIGH (ref 0.3–1.2)
Total Protein: 7.2 g/dL (ref 6.5–8.1)

## 2020-03-30 LAB — URINALYSIS, ROUTINE W REFLEX MICROSCOPIC
Bacteria, UA: NONE SEEN
Bilirubin Urine: NEGATIVE
Glucose, UA: NEGATIVE mg/dL
Ketones, ur: 20 mg/dL — AB
Leukocytes,Ua: NEGATIVE
Nitrite: NEGATIVE
Protein, ur: NEGATIVE mg/dL
Specific Gravity, Urine: 1.028 (ref 1.005–1.030)
pH: 5 (ref 5.0–8.0)

## 2020-03-30 LAB — SARS CORONAVIRUS 2 BY RT PCR (HOSPITAL ORDER, PERFORMED IN ~~LOC~~ HOSPITAL LAB): SARS Coronavirus 2: NEGATIVE

## 2020-03-30 LAB — CBC WITH DIFFERENTIAL/PLATELET
Abs Immature Granulocytes: 0.02 10*3/uL (ref 0.00–0.07)
Basophils Absolute: 0 10*3/uL (ref 0.0–0.1)
Basophils Relative: 0 %
Eosinophils Absolute: 0.1 10*3/uL (ref 0.0–0.5)
Eosinophils Relative: 1 %
HCT: 36.9 % — ABNORMAL LOW (ref 39.0–52.0)
Hemoglobin: 12.6 g/dL — ABNORMAL LOW (ref 13.0–17.0)
Immature Granulocytes: 0 %
Lymphocytes Relative: 19 %
Lymphs Abs: 1.1 10*3/uL (ref 0.7–4.0)
MCH: 31.5 pg (ref 26.0–34.0)
MCHC: 34.1 g/dL (ref 30.0–36.0)
MCV: 92.3 fL (ref 80.0–100.0)
Monocytes Absolute: 0.6 10*3/uL (ref 0.1–1.0)
Monocytes Relative: 10 %
Neutro Abs: 4.2 10*3/uL (ref 1.7–7.7)
Neutrophils Relative %: 70 %
Platelets: 218 10*3/uL (ref 150–400)
RBC: 4 MIL/uL — ABNORMAL LOW (ref 4.22–5.81)
RDW: 13.5 % (ref 11.5–15.5)
WBC: 6 10*3/uL (ref 4.0–10.5)
nRBC: 0 % (ref 0.0–0.2)

## 2020-03-30 LAB — RAPID URINE DRUG SCREEN, HOSP PERFORMED
Amphetamines: POSITIVE — AB
Barbiturates: NOT DETECTED
Benzodiazepines: NOT DETECTED
Cocaine: POSITIVE — AB
Opiates: NOT DETECTED
Tetrahydrocannabinol: POSITIVE — AB

## 2020-03-30 LAB — ETHANOL: Alcohol, Ethyl (B): <10 mg/dL (ref <10)

## 2020-03-30 MED ORDER — STERILE WATER FOR INJECTION IJ SOLN
INTRAMUSCULAR | Status: AC
  Administered 2020-03-30: 1.2 mL via INTRAMUSCULAR
  Filled 2020-03-30: qty 10

## 2020-03-30 MED ORDER — ZIPRASIDONE MESYLATE 20 MG IM SOLR: 20.0000 mg | Freq: Two times a day (BID) | INTRAMUSCULAR | Status: DC | PRN

## 2020-03-30 MED ORDER — LORAZEPAM 1 MG PO TABS
1.0000 mg | ORAL_TABLET | ORAL | Status: AC | PRN
  Administered 2020-03-31: 1 mg via ORAL
  Filled 2020-03-30: qty 1

## 2020-03-30 MED ORDER — ZIPRASIDONE MESYLATE 20 MG IM SOLR
20.0000 mg | Freq: Once | INTRAMUSCULAR | Status: AC
  Administered 2020-03-30: 20 mg via INTRAMUSCULAR
  Filled 2020-03-30: qty 20

## 2020-03-30 MED ORDER — ACETAMINOPHEN 325 MG PO TABS: 650.0000 mg | ORAL_TABLET | ORAL | Status: DC | PRN

## 2020-03-30 NOTE — ED Notes (Signed)
Pt sleeping, sitter at bedside
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
## 2020-03-30 NOTE — ED Notes (Signed)
RN in room for blood draw. Pt states he needs more food before allowing blood draw. Pt is provided a sandwich, cheese, and water. Upon blood draw, pt states seeing other people in the room (no one actually in room). He states the people were trying to eat his food.

## 2020-03-30 NOTE — ED Notes (Signed)
Pt sleeping supine, equal chest rise observed.

## 2020-03-30 NOTE — ED Notes (Signed)
NT reports pt bashing head on side rails of stretcher. One moment pt is calm and cooperative to bashing head on side rails and screaming.
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
## 2020-03-30 NOTE — ED Notes (Signed)
Pt in bed asleep. Chest rise and fall noted.

## 2020-03-30 NOTE — ED Notes (Signed)
I gave patient a sandwich and a cheese stick and some apple juice

## 2020-03-30 NOTE — ED Notes (Signed)
RN heard pt screaming inside room. Pt throwing objects and screaming. EDP Bernette Mayers made aware.

## 2020-03-30 NOTE — BHH Counselor (Signed)
TTS attempted to assess patient. RN set tele-psych cart up. Patient is not able to keep his eyes open or provide any history. He mumbles his name, date of birth, and current location. He then mumbles incoherently and goes back to sleep. TTS will attempt to see patient when alert and able to participate.

## 2020-03-31 NOTE — ED Notes (Addendum)
Patient agitated, pilfering through cabinets and requesting to leave to sitter and night shift RN. Responding to external stimuli, grandiose thoughts. PRN PO ativan given. Some cabinets locked, others are not locking for this RN. No needles found in room.

## 2020-03-31 NOTE — BH Assessment (Signed)
[facility] Assessment Progress Note  Per Berneice Heinrich, FNP, this pt does not require psychiatric hospitalization at this time.  Pt is to be discharged from [facility] with recommendation to follow up with the [facility].  This has been included in pt's discharge instructions, along with other supportive services for veterans.  Pt's nurse has been notified.  Doylene Canning, MA Triage Specialist [phone]
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
## 2020-03-31 NOTE — ED Notes (Signed)
Patient discharged, cleared by psych and EDP gave verbal to print DC papers. Patient notified of DC and told to follow up w/ [facility]. Security at bedside to help escort patient off of property.

## 2020-03-31 NOTE — Consult Note (Signed)
[facility] Psych ED Discharge  03/31/2020 12:10 PM Steve [name]  MRN:  [id number] Principal Problem: Substance induced mood disorder [facility]) Discharge Diagnoses: Principal Problem:   Substance induced mood disorder (Steve [name]) Active Problems:   Amphetamine and psychostimulant-induced psychosis with hallucinations (Steve [name])   Subjective: Patient states "my biggest issue is if I could stay out of jail."  Patient assessed by nurse practitioner.  Patient alert and oriented, answers appropriately.  Patient insist that wife, Steve [name] remain at bedside during assessment.  Patient denies suicidal and homicidal ideations.  Patient denies history of suicide attempts.  Patient denies auditory and visual hallucinations.  Patient reports auditory hallucinations on yesterday after using methamphetamine.  Patient denies symptoms of paranoia.  Patient reports sleep is "off and on." Patient reports he currently resides in [place] with his wife.  Patient denies access to weapons.  Patient is currently unemployed. Patient reports daily use of marijuana and methamphetamine.  Patient states "I just need time right now, I am not ready to stop using, I can think a lot better."  Patient and wife report that there is "a lady staying in their  apartment who was violent and on drugs." Patient reports current stressor is "I missed court and there is a warrant for my arrest as a failure to appear, I do not want to go back to jail, I do not want them to take me back to jail." Patient offered support and encouragement.  Spoke with patient's wife, Steve [name]  Patient's wife denies concerns for patient safety.  Patient's wife denies weapons at home.  Patient's wife reports "he does not need anything as long as he is not doing a bunch of drugs."  Patient's wife reports plan to have both patient and wife stay with wife's mother.  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 Anxiety   . Paranoid schizophrenia (Steve [name])   . PTSD (post-traumatic stress disorder) Paranoid    Past Surgical History:  Procedure Laterality Date  . banding procedure for morbid obesity    . I & D EXTREMITY Right 02/27/2014   Procedure: IRRIGATION AND DEBRIDEMENT FOREARM AND REPAIR OF 30cm LACERATION;  Surgeon: Steve Bridge, MD;  Location: [place];  Service: Orthopedics;  Laterality: Right;  [facility]hesia [facility] with [facility]   Family History:  Family History  Problem Relation Age of Onset  . Depression Mother   . Bipolar disorder Father    Family Psychiatric  History: None reported Social History:  Social History   Substance and Sexual Activity  Alcohol Use Yes     Social History   Substance and Sexual Activity  Drug Use Yes  . Types: Marijuana, Methamphetamines    Social History   Socioeconomic History  . Marital status: Married    Spouse name: Not on file  . Number of children: Not on file  . Years of education: Not on file  . Highest education level: Not on file  Occupational History  . Not on file  Tobacco Use  . Smoking status: Current Some Day Smoker    Packs/day: 0.25    Types: Cigarettes  . Smokeless tobacco: Never Used  Vaping Use  . Vaping Use: Unknown  Substance and Sexual Activity  . Alcohol use: Yes  . Drug use: Yes    Types: Marijuana, Methamphetamines  . Sexual  activity: Not Currently  Other Topics Concern  . Not on file  Social History Narrative  . Not on file   Social Determinants of Health   Financial Resource Strain:   . Difficulty of Paying Living Expenses:   Food Insecurity:   . Worried About Charity fundraiser in the Last Year:   . Arboriculturist in the Last Year:   Transportation Needs:   . Film/video editor (Medical):   [name] Lack of Transportation (Non-Medical):   Physical  Activity:   . Days of Exercise per Week:   . Minutes of Exercise per Session:   Stress:   . Feeling of Stress :   Social Connections:   . Frequency of Communication with Friends and Family:   . Frequency of Social Gatherings with Friends and Family:   . Attends Religious Services:   . Active Member of Clubs or Organizations:   . Attends Archivist Meetings:   [name] Marital Status:     Has this patient used any form of tobacco in the last 30 days? (Cigarettes, Smokeless Tobacco, Cigars, and/or Pipes) A prescription for an FDA-approved tobacco cessation medication was offered at discharge and the patient refused  Current Medications: Current Facility-Administered Medications  Medication Dose Route Frequency Provider Last Rate Last Admin  . acetaminophen (TYLENOL) tablet 650 mg  650 mg Oral Q4H PRN Steve Hidden, MD      . ziprasidone (GEODON) injection 20 mg  20 mg Intramuscular Q12H PRN Steve Hidden, MD       No current outpatient medications on file.   PTA Medications: (Not in a hospital admission)   Musculoskeletal: Strength & Muscle Tone: within normal limits Gait & Station: normal Patient leans: N/A  Psychiatric Specialty Exam: Physical Exam Vitals and nursing note reviewed.  Constitutional:      Appearance: He is well-developed.  HENT:     Head: Normocephalic.  Cardiovascular:     Rate and Rhythm: Normal rate.  Pulmonary:     Effort: Pulmonary effort is normal.  Neurological:     Mental Status: He is alert and oriented to person, place, and time.  Psychiatric:        Attention and Perception: Attention and perception normal.        Mood and Affect: Mood is anxious.        Speech: Speech normal.        Behavior: Behavior normal. Behavior is cooperative.        Thought Content: Thought content normal.        Cognition and Memory: Cognition normal.        Judgment: Judgment normal.     Review of Systems  Constitutional: Negative.   HENT:  Negative.   Eyes: Negative.   Respiratory: Negative.   Cardiovascular: Negative.   Gastrointestinal: Negative.   Genitourinary: Negative.   Musculoskeletal: Negative.   Skin: Negative.   Neurological: Negative.   Psychiatric/Behavioral: The patient is nervous/anxious.     Blood pressure 117/72, pulse 62, temperature 97.9 F (36.6 C), temperature source Oral, resp. rate 19, SpO2 100 %.There is no height or weight on file to calculate BMI.  General Appearance: Casual and Fairly Groomed  Eye Contact:  Fair  Speech:  Clear and Coherent and Normal Rate  Volume:  Normal  Mood:  Anxious  Affect:  Congruent  Thought Process:  Coherent, Goal Directed and Descriptions of Associations: Intact  Orientation:  Full (Time, Place, and Person)  Thought Content:  Logical  Suicidal Thoughts:  No  Homicidal Thoughts:  No  Memory:  Immediate;   Fair Recent;   Fair Remote;   Fair  Judgement:  Fair  Insight:  Fair  Psychomotor Activity:  Normal  Concentration:  Concentration: Fair and Attention Span: Fair  Recall:  [employer] of Knowledge:  Fair  Language:  Fair  Akathisia:  No  Handed:  Right  AIMS (if indicated):     Assets:  Communication Skills Desire for Improvement Financial Resources/Insurance Housing Intimacy Leisure Time Physical Health Resilience  ADL's:  Intact  Cognition:  WNL  Sleep:        Demographic Factors:  Male and Caucasian  Loss Factors: Legal issues  Historical Factors: NA  Risk Reduction Factors:   Living with another person, especially a relative, Positive social support, Positive therapeutic relationship and Positive coping skills or problem solving skills  Continued Clinical Symptoms:  Alcohol/Substance Abuse/Dependencies  Cognitive Features That Contribute To Risk:  None    Suicide Risk:  Minimal: No identifiable suicidal ideation.  Patients presenting with no risk factors but with morbid ruminations; may be classified as minimal risk based on  the severity of the depressive symptoms    Plan Of Care/Follow-up recommendations:  Other:  Patient reviewed with Dr. Hampton Abbot. [facility], follow-up with substance use treatment resources.  Disposition: Discharge Emmaline Kluver, FNP 03/31/2020, 12:10 PM

## 2020-03-31 NOTE — Discharge Instructions (Signed)
For your behavioral health needs, you are advised to follow up with the [facility]  Contact them at your earliest opportunity to schedule a follow up appointment:       [facility]      [address] [place], [place] [address]      [phone]  For supportive services for veterans in the [place] area, contact the [facility]:       [facility]      [address], [address]      [place], [place] [address]      [phone]      Hours of operation: 8:00 am - 7:00 pm, Monday - Friday  For crisis services for veterans, call the [employer], available 24 hours a day, 7 days a week:       [employer] [facility]      [phone]

## 2020-12-28 ENCOUNTER — Encounter (HOSPITAL_COMMUNITY): Payer: Self-pay

## 2020-12-28 ENCOUNTER — Other Ambulatory Visit: Payer: Self-pay

## 2020-12-28 ENCOUNTER — Emergency Department (HOSPITAL_COMMUNITY)
Admission: EM | Admit: 2020-12-28 | Discharge: 2020-12-28 | Disposition: A | Discharge status: Home / Self Care | Payer: No Typology Code available for payment source | Attending: Emergency Medicine | Admitting: Emergency Medicine

## 2020-12-28 DIAGNOSIS — R109 Unspecified abdominal pain: Secondary | ICD-10-CM | POA: Diagnosis present

## 2020-12-28 DIAGNOSIS — N12 Tubulo-interstitial nephritis, not specified as acute or chronic: Secondary | ICD-10-CM | POA: Diagnosis not present

## 2020-12-28 DIAGNOSIS — F17210 Nicotine dependence, cigarettes, uncomplicated: Secondary | ICD-10-CM | POA: Insufficient documentation

## 2020-12-28 DIAGNOSIS — D72829 Elevated white blood cell count, unspecified: Secondary | ICD-10-CM | POA: Diagnosis not present

## 2020-12-28 DIAGNOSIS — R110 Nausea: Secondary | ICD-10-CM

## 2020-12-28 DIAGNOSIS — R202 Paresthesia of skin: Secondary | ICD-10-CM | POA: Insufficient documentation

## 2020-12-28 LAB — URINALYSIS, ROUTINE W REFLEX MICROSCOPIC
Bilirubin Urine: NEGATIVE
Glucose, UA: NEGATIVE mg/dL
Hgb urine dipstick: NEGATIVE
Ketones, ur: 20 mg/dL — AB
Nitrite: POSITIVE — AB
Protein, ur: 30 mg/dL — AB
Specific Gravity, Urine: 1.015 (ref 1.005–1.030)
WBC, UA: >50 WBC/hpf — ABNORMAL HIGH (ref 0–5)
pH: 9 — ABNORMAL HIGH (ref 5.0–8.0)

## 2020-12-28 LAB — CBC
HCT: 40.9 % (ref 39.0–52.0)
Hemoglobin: 14 g/dL (ref 13.0–17.0)
MCH: 30.2 pg (ref 26.0–34.0)
MCHC: 34.2 g/dL (ref 30.0–36.0)
MCV: 88.3 fL (ref 80.0–100.0)
Platelets: 210 10*3/uL (ref 150–400)
RBC: 4.63 MIL/uL (ref 4.22–5.81)
RDW: 12.8 % (ref 11.5–15.5)
WBC: 4.3 10*3/uL (ref 4.0–10.5)
nRBC: 0 % (ref 0.0–0.2)

## 2020-12-28 LAB — COMPREHENSIVE METABOLIC PANEL
ALT: 17 U/L (ref 0–44)
AST: 15 U/L (ref 15–41)
Albumin: 3.8 g/dL (ref 3.5–5.0)
Alkaline Phosphatase: 45 U/L (ref 38–126)
Anion gap: 10 (ref 5–15)
BUN: 7 mg/dL (ref 6–20)
CO2: 21 mmol/L — ABNORMAL LOW (ref 22–32)
Calcium: 9.5 mg/dL (ref 8.9–10.3)
Chloride: 102 mmol/L (ref 98–111)
Creatinine, Ser: 1.08 mg/dL (ref 0.61–1.24)
GFR, Estimated: >60 mL/min (ref >60)
Glucose, Bld: 96 mg/dL (ref 70–99)
Potassium: 3.3 mmol/L — ABNORMAL LOW (ref 3.5–5.1)
Sodium: 133 mmol/L — ABNORMAL LOW (ref 135–145)
Total Bilirubin: 1.2 mg/dL (ref 0.3–1.2)
Total Protein: 7 g/dL (ref 6.5–8.1)

## 2020-12-28 MED ORDER — CEPHALEXIN 500 MG PO CAPS
500.0000 mg | ORAL_CAPSULE | Freq: Two times a day (BID) | ORAL | 0 refills | Status: AC
Start: 2020-12-28 — End: 2021-01-11

## 2020-12-28 NOTE — Discharge Instructions (Signed)
Your history and exam today are consistent with pyelonephritis or infection from her bladder that is going towards her kidneys causing the chills, nausea, and bilateral flank to back pains.  We had a long shared decision-making conversation about further work-up including ultrasound or CT scan to further evaluate for possible infected kidney stone or retained foreign body in your urethra or bladder acting as a contributor to the infection.  After the conversation, you did not want further imaging at this time and would rather try antibiotics and follow-up with PCP.  If any symptoms change or worsen or your symptoms do not improve the next 48 hours, please return to the nearest emergency department immediately as you will likely need further imaging.

## 2020-12-28 NOTE — ED Triage Notes (Signed)
Emergency Medicine Provider Triage Evaluation Note  Steve [name] , a 30 y.o. male  was evaluated in triage.  Pt complains of back pain.  Patient was brought in by EMS.  EMS stated that patient was a paraplegic who needed to be self catheterized by his girlfriend.  EMS was called by the patient's significant other due to darker colored urine.  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  He has had 2 self catheterize for some time.  Initially, he states it has been for 3 months, but later he tells me it is only been for a few days after he "stuck a meth rock in my pee hole".  He endorses low back pain.  He does not answer questions related to urinary symptoms.  He reports associated chills.  He denies abdominal pain, fever, vomiting, chest pain, shortness of breath.  Review of Systems  Positive: Back pain, chills Negative: Fever, vomiting, chest pain, shortness of breath  Physical Exam  BP 108/89 (BP Location: Left Arm)   Pulse (!) 116   Temp 98.3 F (36.8 C) (Oral)   Resp (!) 22   SpO2 100%  Gen:   Awake, no distress, disheveled HEENT:  Atraumatic  Resp:  Normal effort Cardiac:  Tachycardia Abd:   Nondistended, nontender MSK:   Moves bilateral arms without difficulty.  Healing scabs are noted throughout the patient's body. Neuro:  Speech clear   Medical Decision Making  Medically screening exam initiated at 4:50 AM.  Appropriate orders placed.  Steve [name] was informed that the remainder of the evaluation will be completed by another provider, this initial triage assessment does not replace that evaluation, and the importance of remaining in the ED until their evaluation is complete.  Clinical Impression  30 year old male presenting by EMS for dark-colored urine.  He is endorsing low back pain in the ED.  Per chart review, patient has a longstanding history of polysubstance use disorder.  Tachycardic in the ER, but vital signs are otherwise stable.  Will  order bladder scan as patient states that he is unable to void and requires self-catheterization, but details regarding his history of self-catheterization are unclear.  Will order urinalysis and labs.  Patient will require further work-up and evaluation in the emergency department.   Steve [name] A, PA-C 12/28/20 [phone]
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
## 2020-12-28 NOTE — ED Triage Notes (Signed)
Pt BIB EMS from hotel due to back pain.Pt reports he is quadriplegic and that he straight cath and that recently his urine has been dark in color.

## 2020-12-30 LAB — URINE CULTURE: Culture: >=100000 — AB

## 2021-02-01 ENCOUNTER — Encounter (HOSPITAL_COMMUNITY): Payer: Self-pay

## 2021-02-01 ENCOUNTER — Inpatient Hospital Stay (HOSPITAL_COMMUNITY)
Admission: EM | Admit: 2021-02-01 | Discharge: 2021-02-05 | DRG: 603 | Disposition: A | Discharge status: Home / Self Care | Payer: No Typology Code available for payment source | Attending: Internal Medicine | Admitting: Internal Medicine

## 2021-02-01 ENCOUNTER — Emergency Department (HOSPITAL_COMMUNITY): Payer: No Typology Code available for payment source

## 2021-02-01 ENCOUNTER — Other Ambulatory Visit: Payer: Self-pay

## 2021-02-01 DIAGNOSIS — A4902 Methicillin resistant Staphylococcus aureus infection, unspecified site: Secondary | ICD-10-CM | POA: Diagnosis present

## 2021-02-01 DIAGNOSIS — G8220 Paraplegia, unspecified: Secondary | ICD-10-CM | POA: Diagnosis present

## 2021-02-01 DIAGNOSIS — F1990 Other psychoactive substance use, unspecified, uncomplicated: Secondary | ICD-10-CM | POA: Diagnosis present

## 2021-02-01 DIAGNOSIS — E871 Hypo-osmolality and hyponatremia: Secondary | ICD-10-CM | POA: Diagnosis present

## 2021-02-01 DIAGNOSIS — M7022 Olecranon bursitis, left elbow: Secondary | ICD-10-CM | POA: Diagnosis present

## 2021-02-01 DIAGNOSIS — F419 Anxiety disorder, unspecified: Secondary | ICD-10-CM | POA: Diagnosis present

## 2021-02-01 DIAGNOSIS — Z993 Dependence on wheelchair: Secondary | ICD-10-CM

## 2021-02-01 DIAGNOSIS — Z882 Allergy status to sulfonamides status: Secondary | ICD-10-CM

## 2021-02-01 DIAGNOSIS — B999 Unspecified infectious disease: Secondary | ICD-10-CM | POA: Diagnosis not present

## 2021-02-01 DIAGNOSIS — L03114 Cellulitis of left upper limb: Principal | ICD-10-CM | POA: Diagnosis present

## 2021-02-01 DIAGNOSIS — L089 Local infection of the skin and subcutaneous tissue, unspecified: Secondary | ICD-10-CM

## 2021-02-01 DIAGNOSIS — Z9884 Bariatric surgery status: Secondary | ICD-10-CM

## 2021-02-01 DIAGNOSIS — F17210 Nicotine dependence, cigarettes, uncomplicated: Secondary | ICD-10-CM | POA: Diagnosis present

## 2021-02-01 DIAGNOSIS — E869 Volume depletion, unspecified: Secondary | ICD-10-CM | POA: Diagnosis present

## 2021-02-01 DIAGNOSIS — F1910 Other psychoactive substance abuse, uncomplicated: Secondary | ICD-10-CM | POA: Diagnosis present

## 2021-02-01 DIAGNOSIS — Z91018 Allergy to other foods: Secondary | ICD-10-CM

## 2021-02-01 DIAGNOSIS — Z9102 Food additives allergy status: Secondary | ICD-10-CM

## 2021-02-01 DIAGNOSIS — Z9151 Personal history of suicidal behavior: Secondary | ICD-10-CM

## 2021-02-01 DIAGNOSIS — Z20822 Contact with and (suspected) exposure to covid-19: Secondary | ICD-10-CM | POA: Diagnosis present

## 2021-02-01 DIAGNOSIS — M25512 Pain in left shoulder: Secondary | ICD-10-CM | POA: Diagnosis present

## 2021-02-01 DIAGNOSIS — F4310 Post-traumatic stress disorder, unspecified: Secondary | ICD-10-CM | POA: Diagnosis present

## 2021-02-01 DIAGNOSIS — Z818 Family history of other mental and behavioral disorders: Secondary | ICD-10-CM

## 2021-02-01 LAB — CBC WITH DIFFERENTIAL/PLATELET
Abs Immature Granulocytes: 0.05 10*3/uL (ref 0.00–0.07)
Basophils Absolute: 0 10*3/uL (ref 0.0–0.1)
Basophils Relative: 0 %
Eosinophils Absolute: 0.2 10*3/uL (ref 0.0–0.5)
Eosinophils Relative: 3 %
HCT: 31.7 % — ABNORMAL LOW (ref 39.0–52.0)
Hemoglobin: 10.3 g/dL — ABNORMAL LOW (ref 13.0–17.0)
Immature Granulocytes: 1 %
Lymphocytes Relative: 16 %
Lymphs Abs: 1.3 10*3/uL (ref 0.7–4.0)
MCH: 29.1 pg (ref 26.0–34.0)
MCHC: 32.5 g/dL (ref 30.0–36.0)
MCV: 89.5 fL (ref 80.0–100.0)
Monocytes Absolute: 0.5 10*3/uL (ref 0.1–1.0)
Monocytes Relative: 7 %
Neutro Abs: 5.6 10*3/uL (ref 1.7–7.7)
Neutrophils Relative %: 73 %
Platelets: 287 10*3/uL (ref 150–400)
RBC: 3.54 MIL/uL — ABNORMAL LOW (ref 4.22–5.81)
RDW: 13.3 % (ref 11.5–15.5)
WBC: 7.7 10*3/uL (ref 4.0–10.5)
nRBC: 0 % (ref 0.0–0.2)

## 2021-02-01 LAB — LACTIC ACID, PLASMA
Lactic Acid, Venous: 1 mmol/L (ref 0.5–1.9)
Lactic Acid, Venous: 1.2 mmol/L (ref 0.5–1.9)

## 2021-02-01 LAB — COMPREHENSIVE METABOLIC PANEL
ALT: 15 U/L (ref 0–44)
AST: 16 U/L (ref 15–41)
Albumin: 2.6 g/dL — ABNORMAL LOW (ref 3.5–5.0)
Alkaline Phosphatase: 69 U/L (ref 38–126)
Anion gap: 6 (ref 5–15)
BUN: 7 mg/dL (ref 6–20)
CO2: 26 mmol/L (ref 22–32)
Calcium: 8.1 mg/dL — ABNORMAL LOW (ref 8.9–10.3)
Chloride: 96 mmol/L — ABNORMAL LOW (ref 98–111)
Creatinine, Ser: 0.79 mg/dL (ref 0.61–1.24)
GFR, Estimated: >60 mL/min (ref >60)
Glucose, Bld: 100 mg/dL — ABNORMAL HIGH (ref 70–99)
Potassium: 3.9 mmol/L (ref 3.5–5.1)
Sodium: 128 mmol/L — ABNORMAL LOW (ref 135–145)
Total Bilirubin: 0.7 mg/dL (ref 0.3–1.2)
Total Protein: 7 g/dL (ref 6.5–8.1)

## 2021-02-01 LAB — URINALYSIS, ROUTINE W REFLEX MICROSCOPIC
Bacteria, UA: NONE SEEN
Bilirubin Urine: NEGATIVE
Glucose, UA: NEGATIVE mg/dL
Ketones, ur: NEGATIVE mg/dL
Leukocytes,Ua: NEGATIVE
Nitrite: NEGATIVE
Protein, ur: NEGATIVE mg/dL
Specific Gravity, Urine: 1.015 (ref 1.005–1.030)
pH: 6 (ref 5.0–8.0)

## 2021-02-01 LAB — PROTIME-INR
INR: 1.2 (ref 0.8–1.2)
Prothrombin Time: 14.8 seconds (ref 11.4–15.2)

## 2021-02-01 MED ORDER — VANCOMYCIN HCL 1500 MG/300ML IV SOLN
1500.0000 mg | Freq: Once | INTRAVENOUS | Status: AC
  Administered 2021-02-01: 1500 mg via INTRAVENOUS
  Filled 2021-02-01: qty 300

## 2021-02-01 MED ORDER — PIPERACILLIN-TAZOBACTAM 3.375 G IVPB 30 MIN
3.3750 g | Freq: Once | INTRAVENOUS | Status: AC
  Administered 2021-02-01: 3.375 g via INTRAVENOUS
  Filled 2021-02-01: qty 50

## 2021-02-01 MED ORDER — VANCOMYCIN HCL 1000 MG/200ML IV SOLN
1000.0000 mg | Freq: Once | INTRAVENOUS | Status: DC
  Filled 2021-02-01: qty 200

## 2021-02-01 MED ORDER — LIDOCAINE-EPINEPHRINE (PF) 2 %-1:200000 IJ SOLN
20.0000 mL | Freq: Once | INTRAMUSCULAR | Status: AC
  Administered 2021-02-01: 20 mL
  Filled 2021-02-01: qty 20

## 2021-02-01 NOTE — ED Provider Notes (Signed)
Emergency Medicine Provider Triage Evaluation Note  Steve [name] , a 30 y.o. male  was evaluated in triage.  Pt complains of right elbow swelling and pain x 1 week. Patient is wheelchair bound. IV drug user with methamphetamine and cocaine. Endorses chills and fevers x 4 days.   Review of Systems  Positive: Elbow pain, swelling, discharge, fevers, chills Negative: N/V/D, syncope, cough , sore throat, CP, SOB  Physical Exam  BP 117/71 (BP Location: Right Arm)   Pulse 100   Temp 98.6 F (37 C) (Oral)   Resp 16   Ht 6' (1.829 m)   Wt 88.5 kg   SpO2 100%   BMI 26.45 kg/m  Gen:   Awake, no distress   Resp:  Normal effort  MSK:   Moves extremities without difficulty  Cardiology:  RRR, no murmur Other:  Elbow swelling, erythema, fluctuance, purulent discharge.   Medical Decision Making  Medically screening exam initiated at 7:43 PM.  Appropriate orders placed.  [name] was informed that the remainder of the evaluation will be completed by another provider, this initial triage assessment does not replace that evaluation, and the importance of remaining in the ED until their evaluation is complete.  This chart was dictated using voice recognition software, Dragon. Despite the best efforts of this provider to proofread and correct errors, errors may still occur which can change documentation meaning.    Sherrilee Gilles 02/01/21 1946    Koleen Distance, MD 02/01/21 2158

## 2021-02-01 NOTE — ED Notes (Signed)
NT updated pts VS. Took pts BP 2x and charted the highest of the two. Triage RN notified of pts BP.

## 2021-02-01 NOTE — ED Notes (Signed)
One set of blood cultures obtained °
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
## 2021-02-01 NOTE — ED Triage Notes (Signed)
Left elbow swelling and redness for about a week, pt states he is unsure of insect that bit him. Unsure if he had fevers or chills. Reports discharge coming out of wound.

## 2021-02-02 ENCOUNTER — Observation Stay (HOSPITAL_COMMUNITY): Payer: No Typology Code available for payment source

## 2021-02-02 ENCOUNTER — Encounter (HOSPITAL_COMMUNITY): Payer: Self-pay | Admitting: Internal Medicine

## 2021-02-02 DIAGNOSIS — E871 Hypo-osmolality and hyponatremia: Secondary | ICD-10-CM | POA: Diagnosis present

## 2021-02-02 DIAGNOSIS — G8220 Paraplegia, unspecified: Secondary | ICD-10-CM | POA: Diagnosis present

## 2021-02-02 DIAGNOSIS — Z9102 Food additives allergy status: Secondary | ICD-10-CM | POA: Diagnosis not present

## 2021-02-02 DIAGNOSIS — Z9151 Personal history of suicidal behavior: Secondary | ICD-10-CM | POA: Diagnosis not present

## 2021-02-02 DIAGNOSIS — B999 Unspecified infectious disease: Secondary | ICD-10-CM | POA: Diagnosis present

## 2021-02-02 DIAGNOSIS — F1910 Other psychoactive substance abuse, uncomplicated: Secondary | ICD-10-CM | POA: Diagnosis present

## 2021-02-02 DIAGNOSIS — Z20822 Contact with and (suspected) exposure to covid-19: Secondary | ICD-10-CM | POA: Diagnosis present

## 2021-02-02 DIAGNOSIS — M25512 Pain in left shoulder: Secondary | ICD-10-CM | POA: Diagnosis present

## 2021-02-02 DIAGNOSIS — Z818 Family history of other mental and behavioral disorders: Secondary | ICD-10-CM | POA: Diagnosis not present

## 2021-02-02 DIAGNOSIS — F1990 Other psychoactive substance use, unspecified, uncomplicated: Secondary | ICD-10-CM | POA: Diagnosis present

## 2021-02-02 DIAGNOSIS — Z91018 Allergy to other foods: Secondary | ICD-10-CM | POA: Diagnosis not present

## 2021-02-02 DIAGNOSIS — Z993 Dependence on wheelchair: Secondary | ICD-10-CM | POA: Diagnosis not present

## 2021-02-02 DIAGNOSIS — F4310 Post-traumatic stress disorder, unspecified: Secondary | ICD-10-CM | POA: Diagnosis present

## 2021-02-02 DIAGNOSIS — M7022 Olecranon bursitis, left elbow: Secondary | ICD-10-CM | POA: Diagnosis present

## 2021-02-02 DIAGNOSIS — E869 Volume depletion, unspecified: Secondary | ICD-10-CM | POA: Diagnosis present

## 2021-02-02 DIAGNOSIS — Z9884 Bariatric surgery status: Secondary | ICD-10-CM | POA: Diagnosis not present

## 2021-02-02 DIAGNOSIS — A4902 Methicillin resistant Staphylococcus aureus infection, unspecified site: Secondary | ICD-10-CM | POA: Diagnosis present

## 2021-02-02 DIAGNOSIS — F17210 Nicotine dependence, cigarettes, uncomplicated: Secondary | ICD-10-CM | POA: Diagnosis present

## 2021-02-02 DIAGNOSIS — Z882 Allergy status to sulfonamides status: Secondary | ICD-10-CM | POA: Diagnosis not present

## 2021-02-02 DIAGNOSIS — L03114 Cellulitis of left upper limb: Principal | ICD-10-CM

## 2021-02-02 DIAGNOSIS — F419 Anxiety disorder, unspecified: Secondary | ICD-10-CM | POA: Diagnosis present

## 2021-02-02 LAB — CBC
HCT: 31.2 % — ABNORMAL LOW (ref 39.0–52.0)
Hemoglobin: 10.2 g/dL — ABNORMAL LOW (ref 13.0–17.0)
MCH: 29.2 pg (ref 26.0–34.0)
MCHC: 32.7 g/dL (ref 30.0–36.0)
MCV: 89.4 fL (ref 80.0–100.0)
Platelets: 258 10*3/uL (ref 150–400)
RBC: 3.49 MIL/uL — ABNORMAL LOW (ref 4.22–5.81)
RDW: 13.2 % (ref 11.5–15.5)
WBC: 4.2 10*3/uL (ref 4.0–10.5)
nRBC: 0 % (ref 0.0–0.2)

## 2021-02-02 LAB — CBC WITH DIFFERENTIAL/PLATELET
Abs Immature Granulocytes: 0.07 10*3/uL (ref 0.00–0.07)
Basophils Absolute: 0 10*3/uL (ref 0.0–0.1)
Basophils Relative: 1 %
Eosinophils Absolute: 0.3 10*3/uL (ref 0.0–0.5)
Eosinophils Relative: 5 %
HCT: 29.1 % — ABNORMAL LOW (ref 39.0–52.0)
Hemoglobin: 9.6 g/dL — ABNORMAL LOW (ref 13.0–17.0)
Immature Granulocytes: 1 %
Lymphocytes Relative: 24 %
Lymphs Abs: 1.4 10*3/uL (ref 0.7–4.0)
MCH: 29 pg (ref 26.0–34.0)
MCHC: 33 g/dL (ref 30.0–36.0)
MCV: 87.9 fL (ref 80.0–100.0)
Monocytes Absolute: 0.4 10*3/uL (ref 0.1–1.0)
Monocytes Relative: 7 %
Neutro Abs: 3.6 10*3/uL (ref 1.7–7.7)
Neutrophils Relative %: 62 %
Platelets: 244 10*3/uL (ref 150–400)
RBC: 3.31 MIL/uL — ABNORMAL LOW (ref 4.22–5.81)
RDW: 13.2 % (ref 11.5–15.5)
WBC: 5.7 10*3/uL (ref 4.0–10.5)
nRBC: 0 % (ref 0.0–0.2)

## 2021-02-02 LAB — COMPREHENSIVE METABOLIC PANEL
ALT: 14 U/L (ref 0–44)
AST: 16 U/L (ref 15–41)
Albumin: 2.4 g/dL — ABNORMAL LOW (ref 3.5–5.0)
Alkaline Phosphatase: 60 U/L (ref 38–126)
Anion gap: 4 — ABNORMAL LOW (ref 5–15)
BUN: 8 mg/dL (ref 6–20)
CO2: 28 mmol/L (ref 22–32)
Calcium: 8.2 mg/dL — ABNORMAL LOW (ref 8.9–10.3)
Chloride: 103 mmol/L (ref 98–111)
Creatinine, Ser: 0.74 mg/dL (ref 0.61–1.24)
GFR, Estimated: >60 mL/min (ref >60)
Glucose, Bld: 115 mg/dL — ABNORMAL HIGH (ref 70–99)
Potassium: 4 mmol/L (ref 3.5–5.1)
Sodium: 135 mmol/L (ref 135–145)
Total Bilirubin: 0.2 mg/dL — ABNORMAL LOW (ref 0.3–1.2)
Total Protein: 6.5 g/dL (ref 6.5–8.1)

## 2021-02-02 LAB — CREATININE, SERUM
Creatinine, Ser: 0.7 mg/dL (ref 0.61–1.24)
GFR, Estimated: >60 mL/min (ref >60)

## 2021-02-02 LAB — RAPID URINE DRUG SCREEN, HOSP PERFORMED
Amphetamines: POSITIVE — AB
Barbiturates: NOT DETECTED
Benzodiazepines: NOT DETECTED
Cocaine: POSITIVE — AB
Opiates: POSITIVE — AB
Tetrahydrocannabinol: NOT DETECTED

## 2021-02-02 LAB — RESP PANEL BY RT-PCR (FLU A&B, COVID) ARPGX2
Influenza A by PCR: NEGATIVE
Influenza B by PCR: NEGATIVE
SARS Coronavirus 2 by RT PCR: NEGATIVE

## 2021-02-02 LAB — HEMOGLOBIN A1C
Hgb A1c MFr Bld: 5.1 % (ref 4.8–5.6)
Mean Plasma Glucose: 99.67 mg/dL

## 2021-02-02 LAB — MRSA PCR SCREENING: MRSA by PCR: POSITIVE — AB

## 2021-02-02 LAB — PROTIME-INR
INR: 1.2 (ref 0.8–1.2)
Prothrombin Time: 15.5 seconds — ABNORMAL HIGH (ref 11.4–15.2)

## 2021-02-02 LAB — HIV ANTIBODY (ROUTINE TESTING W REFLEX): HIV Screen 4th Generation wRfx: NONREACTIVE

## 2021-02-02 LAB — SURGICAL PCR SCREEN
MRSA, PCR: POSITIVE — AB
Staphylococcus aureus: POSITIVE — AB

## 2021-02-02 LAB — APTT: aPTT: 41 seconds — ABNORMAL HIGH (ref 24–36)

## 2021-02-02 MED ORDER — FOLIC ACID 1 MG PO TABS
1.0000 mg | ORAL_TABLET | Freq: Every day | ORAL | Status: DC
  Administered 2021-02-02 – 2021-02-05 (×4): 1 mg via ORAL
  Filled 2021-02-02 (×4): qty 1

## 2021-02-02 MED ORDER — ONDANSETRON HCL 4 MG/2ML IJ SOLN: 4.0000 mg | Freq: Four times a day (QID) | INTRAMUSCULAR | Status: DC | PRN

## 2021-02-02 MED ORDER — MUPIROCIN 2 % EX OINT
1.0000 "application " | TOPICAL_OINTMENT | Freq: Two times a day (BID) | CUTANEOUS | Status: DC
  Administered 2021-02-02 – 2021-02-05 (×7): 1 via NASAL
  Filled 2021-02-02 (×4): qty 22

## 2021-02-02 MED ORDER — OXYCODONE-ACETAMINOPHEN 5-325 MG PO TABS
1.0000 | ORAL_TABLET | ORAL | Status: DC | PRN
  Administered 2021-02-02 – 2021-02-05 (×7): 1 via ORAL
  Filled 2021-02-02 (×7): qty 1

## 2021-02-02 MED ORDER — LORAZEPAM 1 MG PO TABS
1.0000 mg | ORAL_TABLET | ORAL | Status: AC | PRN
Start: 2021-02-02 — End: 2021-02-05

## 2021-02-02 MED ORDER — ACETAMINOPHEN 650 MG RE SUPP: 650.0000 mg | Freq: Four times a day (QID) | RECTAL | Status: DC | PRN

## 2021-02-02 MED ORDER — ADULT MULTIVITAMIN W/MINERALS CH
1.0000 | ORAL_TABLET | Freq: Every day | ORAL | Status: DC
  Administered 2021-02-02 – 2021-02-05 (×4): 1 via ORAL
  Filled 2021-02-02 (×4): qty 1

## 2021-02-02 MED ORDER — ACETAMINOPHEN 325 MG PO TABS: 650.0000 mg | ORAL_TABLET | Freq: Four times a day (QID) | ORAL | Status: DC | PRN

## 2021-02-02 MED ORDER — THIAMINE HCL 100 MG/ML IJ SOLN
100.0000 mg | Freq: Every day | INTRAMUSCULAR | Status: DC
  Administered 2021-02-05: 100 mg via INTRAVENOUS
  Filled 2021-02-02 (×2): qty 2

## 2021-02-02 MED ORDER — ENOXAPARIN SODIUM 40 MG/0.4ML IJ SOSY
40.0000 mg | PREFILLED_SYRINGE | INTRAMUSCULAR | Status: DC
  Administered 2021-02-02 – 2021-02-04 (×3): 40 mg via SUBCUTANEOUS
  Filled 2021-02-02 (×2): qty 0.4

## 2021-02-02 MED ORDER — LORAZEPAM 2 MG/ML IJ SOLN: 1.0000 mg | INTRAMUSCULAR | Status: AC | PRN

## 2021-02-02 MED ORDER — LACTATED RINGERS IV SOLN: INTRAVENOUS | Status: AC

## 2021-02-02 MED ORDER — THIAMINE HCL 100 MG PO TABS
100.0000 mg | ORAL_TABLET | Freq: Every day | ORAL | Status: DC
  Administered 2021-02-02 – 2021-02-04 (×3): 100 mg via ORAL
  Filled 2021-02-02 (×4): qty 1

## 2021-02-02 MED ORDER — ONDANSETRON HCL 4 MG PO TABS
4.0000 mg | ORAL_TABLET | Freq: Four times a day (QID) | ORAL | Status: DC | PRN
  Filled 2021-02-02 (×3): qty 1

## 2021-02-02 MED ORDER — SODIUM CHLORIDE 0.9 % IV SOLN
2.0000 g | Freq: Three times a day (TID) | INTRAVENOUS | Status: DC
  Administered 2021-02-02 – 2021-02-03 (×5): 2 g via INTRAVENOUS
  Filled 2021-02-02 (×5): qty 2

## 2021-02-02 MED ORDER — POLYETHYLENE GLYCOL 3350 17 G PO PACK: 17.0000 g | PACK | Freq: Every day | ORAL | Status: DC | PRN

## 2021-02-02 MED ORDER — MORPHINE SULFATE (PF) 2 MG/ML IV SOLN
2.0000 mg | INTRAVENOUS | Status: DC | PRN
  Administered 2021-02-02 – 2021-02-04 (×4): 2 mg via INTRAVENOUS
  Filled 2021-02-02 (×4): qty 1

## 2021-02-02 MED ORDER — VANCOMYCIN HCL 1250 MG/250ML IV SOLN
1250.0000 mg | Freq: Three times a day (TID) | INTRAVENOUS | Status: DC
  Administered 2021-02-02 – 2021-02-03 (×4): 1250 mg via INTRAVENOUS
  Filled 2021-02-02 (×5): qty 250

## 2021-02-02 NOTE — Consult Note (Signed)
Reason for Consult:Left arm cellulitis Referring Physician: Doristine Counter Time called: 0867 Time at bedside: Steve [name] is an 30 y.o. male.  HPI: [name] c/o left elbow pain and swelling for about 3d. He's been having milder symptoms for longer but they got significantly worse around the beginning of the week. He also had some subjective fevers but denies chills, sweats, N/V. He also developed a small wound that was draining purulent material. He came to the ED and was admitted. Orthopedic surgery was consulted. He thinks all this may have started when he thinks he got a spider bite on his right wrist. He is RHD.  Past Medical History:  Diagnosis Date  . Anxiety   . Paranoid schizophrenia ([place])   . PTSD (post-traumatic stress disorder) Paranoid    Past Surgical History:  Procedure Laterality Date  . banding procedure for morbid obesity    . I & D EXTREMITY Right 02/27/2014   Procedure: IRRIGATION AND DEBRIDEMENT FOREARM AND REPAIR OF 30cm LACERATION;  Surgeon: Johnny Bridge, MD;  Location: [facility];  Service: Orthopedics;  Laterality: Right;  Anesthesia Regional with MAC    Family History  Problem Relation Age of Onset  . Depression Mother   . Bipolar disorder Father     Social History:  reports that he has been smoking cigarettes. He has been smoking about 0.25 packs per day. He has never used smokeless tobacco. He reports current alcohol use. He reports current drug use. Drugs: Marijuana and Methamphetamines.  Allergies:  Allergies  Allergen Reactions  . Caffeine Anaphylaxis  . Caffeine Anaphylaxis  . Chocolate Anaphylaxis  . Chocolate Anaphylaxis  . Other     Does not eat meat  . Other Other (See Comments)    Does not eat meat  . Sulfa Antibiotics Rash  . Sulfa Antibiotics Rash    Medications: I have reviewed the patient's current medications.  Results for orders placed or performed during the hospital encounter of 02/01/21 (from the past 48 hour(s))  Urinalysis,  Routine w reflex microscopic Urine, Clean Catch     Status: Abnormal   Collection Time: 02/01/21  7:22 PM  Result Value Ref Range   Color, Urine YELLOW YELLOW   APPearance CLEAR CLEAR   Specific Gravity, Urine 1.015 1.005 - 1.030   pH 6.0 5.0 - 8.0   Glucose, UA NEGATIVE NEGATIVE mg/dL   Hgb urine dipstick SMALL (A) NEGATIVE   Bilirubin Urine NEGATIVE NEGATIVE   Ketones, ur NEGATIVE NEGATIVE mg/dL   Protein, ur NEGATIVE NEGATIVE mg/dL   Nitrite NEGATIVE NEGATIVE   Leukocytes,Ua NEGATIVE NEGATIVE   RBC / HPF 6-10 0 - 5 RBC/hpf   WBC, UA 0-5 0 - 5 WBC/hpf   Bacteria, UA NONE SEEN NONE SEEN   Squamous Epithelial / LPF 0-5 0 - 5   Mucus PRESENT     Comment: Performed at [facility], [address] [place], [place] [address]  Comprehensive metabolic panel     Status: Abnormal   Collection Time: 02/01/21  7:34 PM  Result Value Ref Range   Sodium 128 (L) 135 - 145 mmol/L   Potassium 3.9 3.5 - 5.1 mmol/L   Chloride 96 (L) 98 - 111 mmol/L   CO2 26 22 - 32 mmol/L   Glucose, Bld 100 (H) 70 - 99 mg/dL    Comment: Glucose reference range applies only to samples taken after fasting for at least 8 hours.   BUN 7 6 - 20 mg/dL  Creatinine, Ser 0.79 0.61 - 1.24 mg/dL   Calcium 8.1 (L) 8.9 - 10.3 mg/dL   Total Protein 7.0 6.5 - 8.1 g/dL   Albumin 2.6 (L) 3.5 - 5.0 g/dL   AST 16 15 - 41 U/L   ALT 15 0 - 44 U/L   Alkaline Phosphatase 69 38 - 126 U/L   Total Bilirubin 0.7 0.3 - 1.2 mg/dL   GFR, Estimated >60 >60 mL/min    Comment: (NOTE) Calculated using the CKD-EPI Creatinine Equation (2021)    Anion gap 6 5 - 15    Comment: Performed at [facility] [address] [place], [place] [address]  Lactic acid, plasma     Status: None   Collection Time: 02/01/21  7:34 PM  Result Value Ref Range   Lactic Acid, Venous 1.0 0.5 - 1.9 mmol/L    Comment: Performed at [facility] [address], [place], [place] [address]  CBC with Differential     Status: Abnormal    Collection Time: 02/01/21  7:34 PM  Result Value Ref Range   WBC 7.7 4.0 - 10.5 K/uL   RBC 3.54 (L) 4.22 - 5.81 MIL/uL   Hemoglobin 10.3 (L) 13.0 - 17.0 g/dL   HCT 31.7 (L) 39.0 - 52.0 %   MCV 89.5 80.0 - 100.0 fL   MCH 29.1 26.0 - 34.0 pg   MCHC 32.5 30.0 - 36.0 g/dL   RDW 13.3 11.5 - 15.5 %   Platelets 287 150 - 400 K/uL   nRBC 0.0 0.0 - 0.2 %   Neutrophils Relative % 73 %   Neutro Abs 5.6 1.7 - 7.7 K/uL   Lymphocytes Relative 16 %   Lymphs Abs 1.3 0.7 - 4.0 K/uL   Monocytes Relative 7 %   Monocytes Absolute 0.5 0.1 - 1.0 K/uL   Eosinophils Relative 3 %   Eosinophils Absolute 0.2 0.0 - 0.5 K/uL   Basophils Relative 0 %   Basophils Absolute 0.0 0.0 - 0.1 K/uL   Immature Granulocytes 1 %   Abs Immature Granulocytes 0.05 0.00 - 0.07 K/uL    Comment: Performed at [facility] [address], [place], [place] [address]  Protime-INR     Status: None   Collection Time: 02/01/21  7:34 PM  Result Value Ref Range   Prothrombin Time 14.8 11.4 - 15.2 seconds   INR 1.2 0.8 - 1.2    Comment: (NOTE) INR goal varies based on device and disease states. Performed at [facility], [place] [address] [place], [place] [address]   Culture, blood (Routine x 2)     Status: None (Preliminary result)   Collection Time: 02/01/21  7:34 PM   Specimen: BLOOD RIGHT ARM  Result Value Ref Range   Specimen Description BLOOD RIGHT ARM    Special Requests      BOTTLES DRAWN AEROBIC AND ANAEROBIC Blood Culture adequate volume   Culture      NO GROWTH < 12 HOURS Performed at [facility], [place] [address], [place], [place] [address]    Report Status PENDING   Culture, blood (Routine x 2)     Status: None (Preliminary result)   Collection Time: 02/01/21  7:34 PM   Specimen: BLOOD LEFT ARM  Result Value Ref Range   Specimen Description BLOOD LEFT ARM    Special Requests      BOTTLES DRAWN AEROBIC ONLY Blood Culture adequate volume   Culture  NO GROWTH < 12 HOURS Performed at [facility] [address] [place], [place] [address]    Report Status PENDING   Lactic acid, plasma     Status: None   Collection Time: 02/01/21  9:22 PM  Result Value Ref Range   Lactic Acid, Venous 1.2 0.5 - 1.9 mmol/L    Comment: Performed at [facility] [address] [place], [place] [address]  Resp Panel by RT-PCR (Flu A&B, Covid) Nasopharyngeal Swab     Status: None   Collection Time: 02/01/21 10:47 PM   Specimen: Nasopharyngeal Swab; Nasopharyngeal(NP) swabs in vial transport medium  Result Value Ref Range   SARS Coronavirus 2 by RT PCR NEGATIVE NEGATIVE    Comment: (NOTE) SARS-CoV-2 target nucleic acids are NOT DETECTED.  The SARS-CoV-2 RNA is generally detectable in upper respiratory specimens during the acute phase of infection. The lowest concentration of SARS-CoV-2 viral copies this assay can detect is 138 copies/mL. A negative result does not preclude SARS-Cov-2 infection and should not be used as the sole basis for treatment or other patient management decisions. A negative result may occur with  improper specimen collection/handling, submission of specimen other than nasopharyngeal swab, presence of viral mutation(s) within the areas targeted by this assay, and inadequate number of viral copies(<138 copies/mL). A negative result must be combined with clinical observations, patient history, and epidemiological information. The expected result is Negative.  Fact Sheet for Patients:  EntrepreneurPulse.com.au  Fact Sheet for Healthcare Providers:  IncredibleEmployment.be  This test is no t yet approved or cleared by the [place] FDA and  has been authorized for detection and/or diagnosis of SARS-CoV-2 by FDA under an Emergency Use Authorization (EUA). This EUA will remain  in effect (meaning this test can be used) for the duration of the COVID-19 declaration under Section 564(b)(1) of the Act, 21 U.S.C.section  360bbb-3(b)(1), unless the authorization is terminated  or revoked sooner.       Influenza A by PCR NEGATIVE NEGATIVE   Influenza B by PCR NEGATIVE NEGATIVE    Comment: (NOTE) The Xpert Xpress SARS-CoV-2/FLU/RSV plus assay is intended as an aid in the diagnosis of influenza from Nasopharyngeal swab specimens and should not be used as a sole basis for treatment. Nasal washings and aspirates are unacceptable for Xpert Xpress SARS-CoV-2/FLU/RSV testing.  Fact Sheet for Patients: EntrepreneurPulse.com.au  Fact Sheet for Healthcare Providers: IncredibleEmployment.be  This test is not yet approved or cleared by the [place] FDA and has been authorized for detection and/or diagnosis of SARS-CoV-2 by FDA under an Emergency Use Authorization (EUA). This EUA will remain in effect (meaning this test can be used) for the duration of the COVID-19 declaration under Section 564(b)(1) of the Act, 21 U.S.C. section 360bbb-3(b)(1), unless the authorization is terminated or revoked.  Performed at [facility], [place] [address] [place],  [address]   MRSA PCR Screening     Status: Abnormal   Collection Time: 02/02/21  2:10 AM   Specimen: Nasal Mucosa; Nasopharyngeal  Result Value Ref Range   MRSA by PCR POSITIVE (A) NEGATIVE    Comment:        The GeneXpert MRSA Assay (FDA approved for NASAL specimens only), is one component of a comprehensive MRSA colonization surveillance program. It is not intended to diagnose MRSA infection nor to guide or monitor treatment for MRSA infections. RESULT CALLED TO, READ BACK BY AND VERIFIED WITH: RN GOLDY MCBRIAN [id number] AT 615 AM BY CM  Performed at [facility], [place] [address], [place], [place] [address]   Surgical PCR screen     Status: Abnormal   Collection Time: 02/02/21  3:54 AM   Specimen: Nasal Mucosa; Nasal Swab  Result Value Ref Range   MRSA, PCR POSITIVE (A) NEGATIVE    Comment:  RESULT CALLED TO, READ BACK BY AND VERIFIED WITH: RN GOLDY MCGRIAN [id number] AT 54 AM BY CM    Staphylococcus aureus POSITIVE (A) NEGATIVE    Comment: (NOTE) The Xpert SA Assay (FDA approved for NASAL specimens in patients 67 years of age and older), is one component of a comprehensive surveillance program. It is not intended to diagnose infection nor to guide or monitor treatment. Performed at [facility], [place] [address], [place], [place] [address]   HIV Antibody (routine testing w rflx)     Status: None   Collection Time: 02/02/21  4:44 AM  Result Value Ref Range   HIV Screen 4th Generation wRfx Non Reactive Non Reactive    Comment: Performed at [facility], [place] [address], [place], [place] [address]  Comprehensive metabolic panel     Status: Abnormal   Collection Time: 02/02/21  4:44 AM  Result Value Ref Range   Sodium 135 135 - 145 mmol/L   Potassium 4.0 3.5 - 5.1 mmol/L   Chloride 103 98 - 111 mmol/L   CO2 28 22 - 32 mmol/L   Glucose, Bld 115 (H) 70 - 99 mg/dL    Comment: Glucose reference range applies only to samples taken after fasting for at least 8 hours.   BUN 8 6 - 20 mg/dL   Creatinine, Ser 0.74 0.61 - 1.24 mg/dL   Calcium 8.2 (L) 8.9 - 10.3 mg/dL   Total Protein 6.5 6.5 - 8.1 g/dL   Albumin 2.4 (L) 3.5 - 5.0 g/dL   AST 16 15 - 41 U/L   ALT 14 0 - 44 U/L   Alkaline Phosphatase 60 38 - 126 U/L   Total Bilirubin 0.2 (L) 0.3 - 1.2 mg/dL   GFR, Estimated >60 >60 mL/min    Comment: (NOTE) Calculated using the CKD-EPI Creatinine Equation (2021)    Anion gap 4 (L) 5 - 15    Comment: Performed at [facility], [place] [address] [place], [place] [address]  CBC WITH DIFFERENTIAL     Status: Abnormal   Collection Time: 02/02/21  4:44 AM  Result Value Ref Range   WBC 5.7 4.0 - 10.5 K/uL   RBC 3.31 (L) 4.22 - 5.81 MIL/uL   Hemoglobin 9.6 (L) 13.0 - 17.0 g/dL   HCT 29.1 (L) 39.0 - 52.0 %   MCV 87.9 80.0 - 100.0 fL   MCH 29.0 26.0 - 34.0 pg   MCHC 33.0  30.0 - 36.0 g/dL   RDW 13.2 11.5 - 15.5 %   Platelets 244 150 - 400 K/uL   nRBC 0.0 0.0 - 0.2 %   Neutrophils Relative % 62 %   Neutro Abs 3.6 1.7 - 7.7 K/uL   Lymphocytes Relative 24 %   Lymphs Abs 1.4 0.7 - 4.0 K/uL   Monocytes Relative 7 %   Monocytes Absolute 0.4 0.1 - 1.0 K/uL   Eosinophils Relative 5 %   Eosinophils Absolute 0.3 0.0 - 0.5 K/uL   Basophils Relative 1 %   Basophils Absolute 0.0 0.0 - 0.1 K/uL   Immature Granulocytes 1 %   Abs Immature Granulocytes 0.07 0.00 - 0.07  K/uL    Comment: Performed at [facility], [place] [address], [place], [place] [address]  Protime-INR     Status: Abnormal   Collection Time: 02/02/21  4:44 AM  Result Value Ref Range   Prothrombin Time 15.5 (H) 11.4 - 15.2 seconds   INR 1.2 0.8 - 1.2    Comment: (NOTE) INR goal varies based on device and disease states. Performed at [facility], [place] [address] [place], [place] [address]   APTT     Status: Abnormal   Collection Time: 02/02/21  4:44 AM  Result Value Ref Range   aPTT 41 (H) 24 - 36 seconds    Comment:        IF BASELINE aPTT IS ELEVATED, SUGGEST PATIENT RISK ASSESSMENT BE USED TO DETERMINE APPROPRIATE ANTICOAGULANT THERAPY. Performed at [facility], [place] [address] [place] [address], [place], [place] [address]   Hemoglobin A1c     Status: None   Collection Time: 02/02/21  4:44 AM  Result Value Ref Range   Hgb A1c MFr Bld 5.1 4.8 - 5.6 %    Comment: (NOTE) Pre diabetes:          5.7%-6.4%  Diabetes:              >6.4%  Glycemic control for   <7.0% adults with diabetes    Mean Plasma Glucose 99.67 mg/dL    Comment: Performed at [facility] [address], [place], [place] [address]  Urine rapid drug screen (hosp performed)     Status: Abnormal   Collection Time: 02/02/21 10:54 AM  Result Value Ref Range   Opiates POSITIVE (A) NONE DETECTED   Cocaine POSITIVE (A) NONE DETECTED   Benzodiazepines NONE DETECTED NONE DETECTED   Amphetamines POSITIVE (A) NONE  DETECTED   Tetrahydrocannabinol NONE DETECTED NONE DETECTED   Barbiturates NONE DETECTED NONE DETECTED    Comment: (NOTE) DRUG SCREEN FOR MEDICAL PURPOSES ONLY.  IF CONFIRMATION IS NEEDED FOR ANY PURPOSE, NOTIFY LAB WITHIN 5 DAYS.  LOWEST DETECTABLE LIMITS FOR URINE DRUG SCREEN Drug Class                     Cutoff (ng/mL) Amphetamine and metabolites    1000 Barbiturate and metabolites    200 Benzodiazepine                 343 Tricyclics and metabolites     300 Opiates and metabolites        300 Cocaine and metabolites        300 THC                            50 Performed at [facility], [place] [address] [place], [place] [address]   CBC     Status: Abnormal   Collection Time: 02/02/21 12:00 PM  Result Value Ref Range   WBC 4.2 4.0 - 10.5 K/uL   RBC 3.49 (L) 4.22 - 5.81 MIL/uL   Hemoglobin 10.2 (L) 13.0 - 17.0 g/dL   HCT 31.2 (L) 39.0 - 52.0 %   MCV 89.4 80.0 - 100.0 fL   MCH 29.2 26.0 - 34.0 pg   MCHC 32.7 30.0 - 36.0 g/dL   RDW 13.2 11.5 - 15.5 %   Platelets 258 150 - 400 K/uL   nRBC 0.0 0.0 - 0.2 %    Comment: Performed at [facility], [place]  [address] [place], [place] [address]  Creatinine, serum     Status: None   Collection Time: 02/02/21 12:00 PM  Result Value Ref Range   Creatinine, Ser 0.70 0.61 - 1.24 mg/dL   GFR, Estimated >60 >60 mL/min    Comment: (NOTE) Calculated using the CKD-EPI Creatinine Equation (2021) Performed at [facility] [address], [place], [place] [address]     DG Chest 2 View  Result Date: 02/01/2021 CLINICAL DATA:  IV drug user, wheelchair-bound, chills and fever for 4 days EXAM: CHEST - 2 VIEW COMPARISON:  Radiograph 09/30/2018, CT 02/27/2014 FINDINGS: No consolidation, features of edema, pneumothorax, or effusion. Pulmonary vascularity is normally distributed. The cardiomediastinal contours are unremarkable. Air-filled appearance of the splenic flexure, nonspecific though similar to priors. No other acute  osseous or soft tissue abnormality. Prior cervical fusion, incompletely assessed on this exam. IMPRESSION: No acute cardiopulmonary abnormality. Electronically Signed   By: Lovena Le M.D.   On: 02/01/2021 21:19   DG ELBOW COMPLETE LEFT (3+VIEW)  Result Date: 02/01/2021 CLINICAL DATA:  IVDU, elbow pain and swelling for 1 week EXAM: LEFT ELBOW - COMPLETE 3+ VIEW COMPARISON:  Forearm radiograph 02/27/2014 FINDINGS: Diffuse edematous changes in the soft tissues about the elbow. More focal soft tissue swelling noted superficial to the olecranon with overlying skin thickening. No soft tissue gas. Thin linear radiodensity along the posterior soft tissues at the level of the elbow, nonspecific, could reflect external debris or foreign body. No acute bony abnormality. Specifically, no fracture, subluxation, or dislocation. Minimal spurring at the coronoid. IMPRESSION: Diffuse edematous changes, skin thickening and more focal swelling posterior to the elbow, could reflect a cellulitis with bursitis or possible abscess in the setting of IVDU. Correlate with clinical findings. Thin linear radiodensity in the posterior soft tissues, could reflect foreign body or external debris. No acute osseous abnormality. Minimal degenerative spurring along the coronoid. Electronically Signed   By: Lovena Le M.D.   On: 02/01/2021 21:22   CT ELBOW LEFT WO CONTRAST  Result Date: 02/02/2021 CLINICAL DATA:  Septic arthritis suspected, pain, swelling, redness, warmth to left forearm and elbow for several days EXAM: CT OF THE UPPER LEFT EXTREMITY WITHOUT CONTRAST TECHNIQUE: Multidetector CT imaging of the upper left extremity was performed according to the standard protocol. COMPARISON:  Elbow radiograph 02/01/2021 FINDINGS: Bones/Joint/Cartilage There is no evidence of acute fracture. There is no periostitis. There is no focal bone erosion/destruction. No large joint effusion. Ligaments Suboptimally assessed by CT. Muscles and  Tendons There is no significant muscle atrophy. Soft tissues There is a the significant soft tissue swelling superficially along the elbow, most pronounced posteriorly along the olecranon. There is a and small foci of soft tissue gas with surrounding higher density material posteriorly which could be an open wound/draining sinus tract. There is an IV inserted in the antecubital fossa. IMPRESSION: Extensive superficial soft tissue swelling along the elbow, most pronounced posteriorly along the olecranon compatible with bursitis. Small foci of gas and higher density material posteriorly which could represent an open wound/draining sinus tract, correlate with exam. No visible well-defined fluid collection, large joint effusion, or osseous changes to suggest osteomyelitis on noncontrast CT. If this is of further clinical concern, MRI with and without contrast would be more definitive. Electronically Signed   By: Maurine Simmering   On: 02/02/2021 08:11    Review of Systems  Constitutional: Positive for fever. Negative for chills and diaphoresis.  HENT: Negative for ear discharge, ear pain, hearing loss  and tinnitus.   Eyes: Negative for photophobia and pain.  Respiratory: Negative for cough and shortness of breath.   Cardiovascular: Negative for chest pain.  Gastrointestinal: Negative for abdominal pain, nausea and vomiting.  Genitourinary: Negative for dysuria, flank pain, frequency and urgency.  Musculoskeletal: Positive for arthralgias (Left arm). Negative for back pain, myalgias and neck pain.  Neurological: Negative for dizziness and headaches.  Hematological: Does not bruise/bleed easily.  Psychiatric/Behavioral: The patient is not nervous/anxious.    Blood pressure 127/74, pulse 71, temperature 97.6 F (36.4 C), temperature source Oral, resp. rate 19, height 6' (1.829 m), weight 88.5 kg, SpO2 96 %. Physical Exam Constitutional:      General: He is not in acute distress.    Appearance: He is  well-developed. He is not diaphoretic.  HENT:     Head: Normocephalic and atraumatic.  Eyes:     General: No scleral icterus.       Right eye: No discharge.        Left eye: No discharge.     Conjunctiva/sclera: Conjunctivae normal.  Cardiovascular:     Rate and Rhythm: Normal rate and regular rhythm.  Pulmonary:     Effort: Pulmonary effort is normal. No respiratory distress.  Musculoskeletal:     Cervical back: Normal range of motion.     Comments: Left shoulder, elbow, wrist, digits- Posterior elbow ulceration, mild TTP, no instability, no blocks to motion  Sens  Ax/R/M/U intact  Mot   Ax/ R/ PIN/ M/ AIN/ U intact  Rad 2+  Skin:    General: Skin is warm and dry.  Neurological:     Mental Status: He is alert.  Psychiatric:        Behavior: Behavior normal.     Assessment/Plan: LUE cellulitis -- No surgical indication at this point and no indication of joint involvement. Pt notes it appears like it is improving on the antibiotics. As long as no clinical changes would continue to treat medically. No need for orthopedic f/u.    Lisette Abu, PA-C Orthopedic Surgery [phone] 02/02/2021, 2:55 PM

## 2021-02-02 NOTE — Progress Notes (Addendum)
NCM called Department of  [facility] Notification Hotline [phone]) and made them aware of pt's current hospital visit. Authorization code received : [id number]. Gae Gallop RN,BSN,CM   02/03/2021 @ 0800 NCM made [facility] transfer coordinator/ April  aware on pt current hospital visit. Voice message left. Awaiting call back from April. April to give pt's PCP and SW contact information.   02/03/2021 @ 0858  Call received from April / [facility] transfer coordinator/. 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 SW- [place], [phone], ext [phone]. [employer] works with the unassigned veterans.

## 2021-02-02 NOTE — Consult Note (Signed)
WOC Nurse Consult Note: Patient receiving care in [facility] 2 5N16. Primary RN in room at time of my evaluation. Reason for Consult: left elbow, right forearm wounds Wound type: Infectious. Left elbow had I&D performed yesterday. I see from the H&P Ortho services may be consulted for the elbow. Pressure Injury POA: Yes Measurement: Wound bed: right elbow is full thickness wound, pink bed, surrounding erythema, no drainage, currently without any type of dressing.  Right wrist wound is black, raised, surrounding erythema. Right knee wound is superficial and yellow. Left upper/inner thigh is wound is superficial and yellow. The patient denies injecting anything into these areas.  The RN informs me the patient is NPO and is possibly going to have additional interventions to the left elbow today.  I will defer this wound to medical management. Drainage (amount, consistency, odor)  Periwound: Dressing procedure/placement/frequency:  Cleanse areas on right wrist, right elbow, right knee, left thigh with soap and water, pat dry. Place small foam dressings over the areas. The patient did not attempt to provide any insights into how these areas developed. It may be helpful to the patient to ask surgical services to evaluate the area on the right wrist as well. Thank you for the consult.  Discussed plan of care with the patient and bedside nurse.  WOC nurse will not follow at this time.  Please re-consult the WOC team if needed.  Helmut Muster, RN, MSN, CWOCN, CNS-BC, pager [phone]

## 2021-02-02 NOTE — Plan of Care (Signed)
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
## 2021-02-02 NOTE — Plan of Care (Signed)
  Problem: Education: Goal: Knowledge of General Education information will improve Description: Including pain rating scale, medication(s)/side effects and non-pharmacologic comfort measures Outcome: Progressing   Problem: Health Behavior/Discharge Planning: Goal: Ability to manage health-related needs will improve Outcome: Progressing   Problem: Clinical Measurements: Goal: Ability to maintain clinical measurements within normal limits will improve Outcome: Progressing Goal: Will remain free from infection Outcome: Progressing Goal: Diagnostic test results will improve Outcome: Progressing Goal: Respiratory complications will improve Outcome: Progressing Goal: Cardiovascular complication will be avoided Outcome: Progressing   Problem: Nutrition: Goal: Adequate nutrition will be maintained Outcome: Progressing   Problem: Coping: Goal: Level of anxiety will decrease Outcome: Progressing   Problem: Pain Managment: Goal: General experience of comfort will improve Outcome: Progressing   Problem: Clinical Measurements: Goal: Ability to avoid or minimize complications of infection will improve Outcome: Progressing   Problem: Skin Integrity: Goal: Skin integrity will improve Outcome: Progressing

## 2021-02-02 NOTE — Progress Notes (Addendum)
Pharmacy Antibiotic Note  Steve [name] is a 30 y.o. male admitted on 02/01/2021 with concern for septic arthritis.  Pharmacy has been consulted for vancomycin and cefepime dosing.  Plan: Vancomycin 1500mg  x1 then 1250mg  IV Q8H. Goal AUC 400-550.  Expected AUC 460.  SCr 0.8.  Cefepime 2g IV Q8H.  Height: 6' (182.9 cm) Weight: 88.5 kg (195 lb) IBW/kg (Calculated) : 77.6  Temp (24hrs), Avg:98.6 F (37 C), Min:98.6 F (37 C), Max:98.6 F (37 C)  Recent Labs  Lab 02/01/21 1934 02/01/21 2122  WBC 7.7  --   CREATININE 0.79  --   LATICACIDVEN 1.0 1.2    Estimated Creatinine Clearance: 148.2 mL/min (by C-G formula based on SCr of 0.79 mg/dL).    Allergies  Allergen Reactions  . Caffeine Anaphylaxis  . Caffeine Anaphylaxis  . Chocolate Anaphylaxis  . Chocolate Anaphylaxis  . Other     Does not eat meat  . Other Other (See Comments)    Does not eat meat  . Sulfa Antibiotics Rash  . Sulfa Antibiotics Rash    Thank you for allowing pharmacy to be a part of this patient's care.  02/03/21, PharmD, BCPS  02/02/2021 2:24 AM
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
## 2021-02-02 NOTE — Progress Notes (Signed)
Dressing to LUE changed. Dressing c/d/i.
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
## 2021-02-02 NOTE — Progress Notes (Signed)
Labs, notes, and images reviewed. CT elbow shows no evidence of fluid collection, osteomyelitis, or concerning features requiring urgent surgery.   Will treat non-operatively with IV antibiotics for now.  Formal consult to follow.    Levester Fresh PA-C Office [phone] 02/02/2021, 9:39AM

## 2021-02-03 ENCOUNTER — Other Ambulatory Visit (HOSPITAL_COMMUNITY): Payer: Self-pay

## 2021-02-03 DIAGNOSIS — E871 Hypo-osmolality and hyponatremia: Secondary | ICD-10-CM

## 2021-02-03 DIAGNOSIS — G8220 Paraplegia, unspecified: Secondary | ICD-10-CM

## 2021-02-03 DIAGNOSIS — L03114 Cellulitis of left upper limb: Secondary | ICD-10-CM | POA: Diagnosis not present

## 2021-02-03 LAB — URINE CULTURE: Culture: NO GROWTH

## 2021-02-03 LAB — BASIC METABOLIC PANEL
Anion gap: 6 (ref 5–15)
BUN: 6 mg/dL (ref 6–20)
CO2: 25 mmol/L (ref 22–32)
Calcium: 8.3 mg/dL — ABNORMAL LOW (ref 8.9–10.3)
Chloride: 103 mmol/L (ref 98–111)
Creatinine, Ser: 0.68 mg/dL (ref 0.61–1.24)
GFR, Estimated: >60 mL/min (ref >60)
Glucose, Bld: 98 mg/dL (ref 70–99)
Potassium: 4 mmol/L (ref 3.5–5.1)
Sodium: 134 mmol/L — ABNORMAL LOW (ref 135–145)

## 2021-02-03 LAB — CBC
HCT: 29.1 % — ABNORMAL LOW (ref 39.0–52.0)
Hemoglobin: 9.6 g/dL — ABNORMAL LOW (ref 13.0–17.0)
MCH: 28.9 pg (ref 26.0–34.0)
MCHC: 33 g/dL (ref 30.0–36.0)
MCV: 87.7 fL (ref 80.0–100.0)
Platelets: 220 10*3/uL (ref 150–400)
RBC: 3.32 MIL/uL — ABNORMAL LOW (ref 4.22–5.81)
RDW: 13.2 % (ref 11.5–15.5)
WBC: 2.5 10*3/uL — ABNORMAL LOW (ref 4.0–10.5)
nRBC: 0 % (ref 0.0–0.2)

## 2021-02-03 MED ORDER — ORITAVANCIN DIPHOSPHATE 400 MG IV SOLR
1200.0000 mg | Freq: Once | INTRAVENOUS | Status: AC
  Administered 2021-02-03: 1200 mg via INTRAVENOUS
  Filled 2021-02-03: qty 120

## 2021-02-03 MED ORDER — AMOXICILLIN-POT CLAVULANATE 875-125 MG PO TABS
1.0000 | ORAL_TABLET | Freq: Two times a day (BID) | ORAL | Status: DC
  Administered 2021-02-03 – 2021-02-05 (×4): 1 via ORAL
  Filled 2021-02-03 (×4): qty 1
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
## 2021-02-04 DIAGNOSIS — L03114 Cellulitis of left upper limb: Secondary | ICD-10-CM | POA: Diagnosis not present

## 2021-02-04 LAB — CBC
HCT: 29.1 % — ABNORMAL LOW (ref 39.0–52.0)
Hemoglobin: 9.8 g/dL — ABNORMAL LOW (ref 13.0–17.0)
MCH: 29.3 pg (ref 26.0–34.0)
MCHC: 33.7 g/dL (ref 30.0–36.0)
MCV: 86.9 fL (ref 80.0–100.0)
Platelets: 228 10*3/uL (ref 150–400)
RBC: 3.35 MIL/uL — ABNORMAL LOW (ref 4.22–5.81)
RDW: 13.2 % (ref 11.5–15.5)
WBC: 3 10*3/uL — ABNORMAL LOW (ref 4.0–10.5)
nRBC: 0 % (ref 0.0–0.2)

## 2021-02-04 LAB — C-REACTIVE PROTEIN: CRP: 2.1 mg/dL — ABNORMAL HIGH (ref <1.0)

## 2021-02-04 LAB — SEDIMENTATION RATE: Sed Rate: 75 mm/hr — ABNORMAL HIGH (ref 0–16)

## 2021-02-04 LAB — BASIC METABOLIC PANEL
Anion gap: 7 (ref 5–15)
BUN: 5 mg/dL — ABNORMAL LOW (ref 6–20)
CO2: 25 mmol/L (ref 22–32)
Calcium: 8.5 mg/dL — ABNORMAL LOW (ref 8.9–10.3)
Chloride: 102 mmol/L (ref 98–111)
Creatinine, Ser: 0.69 mg/dL (ref 0.61–1.24)
GFR, Estimated: >60 mL/min (ref >60)
Glucose, Bld: 107 mg/dL — ABNORMAL HIGH (ref 70–99)
Potassium: 4.1 mmol/L (ref 3.5–5.1)
Sodium: 134 mmol/L — ABNORMAL LOW (ref 135–145)

## 2021-02-04 LAB — HEPATITIS C ANTIBODY: HCV Ab: REACTIVE — AB
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
## 2021-02-04 NOTE — Plan of Care (Signed)

## 2021-02-04 NOTE — Progress Notes (Signed)
Patient and wife are saying patient is not allergic to meet, caffeine and chocolate. They are saying it was mistakenly put in that one time patient had said he didn't want to eat meet or have any caffeine drinks at that time. Allergy list updated.

## 2021-02-05 DIAGNOSIS — L03114 Cellulitis of left upper limb: Secondary | ICD-10-CM | POA: Diagnosis not present

## 2021-02-05 LAB — BASIC METABOLIC PANEL
Anion gap: 7 (ref 5–15)
BUN: 7 mg/dL (ref 6–20)
CO2: 27 mmol/L (ref 22–32)
Calcium: 8.5 mg/dL — ABNORMAL LOW (ref 8.9–10.3)
Chloride: 102 mmol/L (ref 98–111)
Creatinine, Ser: 0.67 mg/dL (ref 0.61–1.24)
GFR, Estimated: >60 mL/min (ref >60)
Glucose, Bld: 127 mg/dL — ABNORMAL HIGH (ref 70–99)
Potassium: 3.7 mmol/L (ref 3.5–5.1)
Sodium: 136 mmol/L (ref 135–145)

## 2021-02-05 LAB — CBC
HCT: 28.8 % — ABNORMAL LOW (ref 39.0–52.0)
Hemoglobin: 9.5 g/dL — ABNORMAL LOW (ref 13.0–17.0)
MCH: 29 pg (ref 26.0–34.0)
MCHC: 33 g/dL (ref 30.0–36.0)
MCV: 87.8 fL (ref 80.0–100.0)
Platelets: 231 10*3/uL (ref 150–400)
RBC: 3.28 MIL/uL — ABNORMAL LOW (ref 4.22–5.81)
RDW: 13.1 % (ref 11.5–15.5)
WBC: 3.2 10*3/uL — ABNORMAL LOW (ref 4.0–10.5)
nRBC: 0 % (ref 0.0–0.2)

## 2021-02-05 MED ORDER — AMOXICILLIN-POT CLAVULANATE 875-125 MG PO TABS: 1.0000 | ORAL_TABLET | Freq: Two times a day (BID) | ORAL | 0 refills | Status: AC

## 2021-02-05 MED ORDER — AMOXICILLIN-POT CLAVULANATE 875-125 MG PO TABS: 1.0000 | ORAL_TABLET | Freq: Two times a day (BID) | ORAL | 0 refills | Status: DC

## 2021-02-05 MED ORDER — MUPIROCIN 2 % EX OINT: 1.0000 "application " | TOPICAL_OINTMENT | Freq: Two times a day (BID) | CUTANEOUS | 0 refills | Status: AC

## 2021-02-05 NOTE — Plan of Care (Signed)
  Problem: Education: Goal: Knowledge of General Education information will improve Description: Including pain rating scale, medication(s)/side effects and non-pharmacologic comfort measures Outcome: Completed/Met   Problem: Health Behavior/Discharge Planning: Goal: Ability to manage health-related needs will improve Outcome: Completed/Met   Problem: Clinical Measurements: Goal: Ability to maintain clinical measurements within normal limits will improve Outcome: Completed/Met Goal: Will remain free from infection Outcome: Completed/Met Goal: Diagnostic test results will improve Outcome: Completed/Met Goal: Respiratory complications will improve Outcome: Completed/Met Goal: Cardiovascular complication will be avoided Outcome: Completed/Met   Problem: Activity: Goal: Risk for activity intolerance will decrease Outcome: Completed/Met   Problem: Nutrition: Goal: Adequate nutrition will be maintained Outcome: Completed/Met   Problem: Coping: Goal: Level of anxiety will decrease Outcome: Completed/Met   Problem: Elimination: Goal: Will not experience complications related to bowel motility Outcome: Completed/Met Goal: Will not experience complications related to urinary retention Outcome: Completed/Met   Problem: Pain Managment: Goal: General experience of comfort will improve Outcome: Completed/Met   Problem: Safety: Goal: Ability to remain free from injury will improve Outcome: Completed/Met   Problem: Skin Integrity: Goal: Risk for impaired skin integrity will decrease Outcome: Completed/Met   Problem: Clinical Measurements: Goal: Ability to avoid or minimize complications of infection will improve Outcome: Completed/Met   Problem: Skin Integrity: Goal: Skin integrity will improve Outcome: Completed/Met  Discharge instructions reviewed with patient and healthcare provider.  These included, but were not limited to, the following:  medications and rationales,  dressing changes, when to call the MD, when to activate EMS, prevention of complications due to immobility, etc.  Comprehension of instructions ascertained via "teach-back"technique.  Patient escorted to exit via private wheelchair by nurse tech.

## 2021-02-05 NOTE — Progress Notes (Signed)
Patient discharged to place of residence with care-taker.  Escorted to exit via wheelchair.

## 2021-02-05 NOTE — TOC Transition Note (Signed)
Transition of Care [facility]) - CM/SW Discharge Note   Patient Details  Name: Steve [name] MRN: [id number] Date of Birth: 1991/04/13  [facility] CM/SW Contact:  Bess Kinds, RN Phone Number: [phone] 02/05/2021, 12:43 PM   Clinical Narrative:     Spoke with patient and caregiver at the bedside. Discussed antibiotic prescription sent to pharmacy. Advised of [employer]. Patient requesting prescription be sent to [facility]. Message sent to MD.   Caregiver asking about PT at home. [facility]   Caregiver to provide transportation home.   Final next level of care: Home/Self Care Barriers to Discharge: No Barriers Identified   Patient Goals and CMS Choice Patient states their goals for this hospitalization and ongoing recovery are:: return home CMS Medicare.gov Compare Post Acute Care list provided to:: Patient Choice offered to / list presented to : NA  Discharge Placement                       Discharge Plan and Services     Post Acute Care Choice: NA                               Social Determinants of Health (SDOH) Interventions     Readmission Risk Interventions No flowsheet data found.

## 2021-02-05 NOTE — Progress Notes (Deleted)
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 MONONUCLEAR RARE GRAM POSITIVE COCCI Performed at [facility], [address] [place], [place] [address]    Culture PENDING  Incomplete   Report Status PENDING  Incomplete     Time coordinating discharge: 25 minutes  SIGNED: Lanae Boast, MD  [facility] 02/05/2021, 10:49 AM  If 7PM-7AM, please contact night-coverage [email or web address]

## 2021-02-05 NOTE — Discharge Summary (Signed)
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  Result Value Ref Range Status   Specimen Description ARM  Final   Special Requests LEFT  Final   Gram Stain   Final    FEW WBC PRESENT,BOTH PMN AND MONONUCLEAR RARE GRAM POSITIVE COCCI Performed at [facility], [address], [place], [place] [address]    Culture PENDING  Incomplete   Report Status PENDING  Incomplete     Time coordinating  discharge: 25 minutes  SIGNED: Lanae Boast, MD  [facility] 02/05/2021, 1:53 PM  If 7PM-7AM, please contact night-coverage [email or web address]

## 2021-02-06 LAB — CULTURE, BLOOD (ROUTINE X 2)
Culture: NO GROWTH
Culture: NO GROWTH
Special Requests: ADEQUATE
Special Requests: ADEQUATE

## 2021-02-09 LAB — AEROBIC/ANAEROBIC CULTURE W GRAM STAIN (SURGICAL/DEEP WOUND)

## 2021-02-14 ENCOUNTER — Inpatient Hospital Stay: Payer: No Typology Code available for payment source | Admitting: Internal Medicine

## 2021-02-14 ENCOUNTER — Telehealth: Payer: Self-pay

## 2021-02-14 NOTE — Telephone Encounter (Signed)
Attempted to call patient regarding today's appointment, call could not be completed.   Sandie Ano, RN

## 2021-05-14 ENCOUNTER — Emergency Department (HOSPITAL_BASED_OUTPATIENT_CLINIC_OR_DEPARTMENT_OTHER)
Admission: EM | Admit: 2021-05-14 | Discharge: 2021-05-15 | Disposition: A | Discharge status: Home / Self Care | Payer: No Typology Code available for payment source | Attending: Emergency Medicine | Admitting: Emergency Medicine

## 2021-05-14 ENCOUNTER — Other Ambulatory Visit: Payer: Self-pay

## 2021-05-14 ENCOUNTER — Encounter (HOSPITAL_BASED_OUTPATIENT_CLINIC_OR_DEPARTMENT_OTHER): Payer: Self-pay | Admitting: Obstetrics and Gynecology

## 2021-05-14 DIAGNOSIS — T2123XA Burn of second degree of upper back, initial encounter: Secondary | ICD-10-CM | POA: Insufficient documentation

## 2021-05-14 DIAGNOSIS — T83511A Infection and inflammatory reaction due to indwelling urethral catheter, initial encounter: Secondary | ICD-10-CM | POA: Insufficient documentation

## 2021-05-14 DIAGNOSIS — Y846 Urinary catheterization as the cause of abnormal reaction of the patient, or of later complication, without mention of misadventure at the time of the procedure: Secondary | ICD-10-CM | POA: Insufficient documentation

## 2021-05-14 DIAGNOSIS — N390 Urinary tract infection, site not specified: Secondary | ICD-10-CM

## 2021-05-14 DIAGNOSIS — T300 Burn of unspecified body region, unspecified degree: Secondary | ICD-10-CM

## 2021-05-14 DIAGNOSIS — F17210 Nicotine dependence, cigarettes, uncomplicated: Secondary | ICD-10-CM | POA: Diagnosis not present

## 2021-05-14 DIAGNOSIS — T2122XA Burn of second degree of abdominal wall, initial encounter: Secondary | ICD-10-CM | POA: Insufficient documentation

## 2021-05-14 DIAGNOSIS — X19XXXA Contact with other heat and hot substances, initial encounter: Secondary | ICD-10-CM | POA: Diagnosis not present

## 2021-05-14 DIAGNOSIS — T2103XA Burn of unspecified degree of upper back, initial encounter: Secondary | ICD-10-CM | POA: Diagnosis present

## 2021-05-14 LAB — URINALYSIS, ROUTINE W REFLEX MICROSCOPIC
Bilirubin Urine: NEGATIVE
Glucose, UA: NEGATIVE mg/dL
Ketones, ur: NEGATIVE mg/dL
Nitrite: NEGATIVE
Protein, ur: 30 mg/dL — AB
Specific Gravity, Urine: 1.02 (ref 1.005–1.030)
WBC, UA: >50 WBC/hpf — ABNORMAL HIGH (ref 0–5)
pH: 6.5 (ref 5.0–8.0)

## 2021-05-14 NOTE — ED Triage Notes (Signed)
Patient reports to the ER for UTI. Patient reports he self caths and needs antibiotics.

## 2021-05-15 LAB — BASIC METABOLIC PANEL
Anion gap: 7 (ref 5–15)
BUN: 9 mg/dL (ref 6–20)
CO2: 27 mmol/L (ref 22–32)
Calcium: 9.1 mg/dL (ref 8.9–10.3)
Chloride: 98 mmol/L (ref 98–111)
Creatinine, Ser: 0.68 mg/dL (ref 0.61–1.24)
GFR, Estimated: >60 mL/min (ref >60)
Glucose, Bld: 101 mg/dL — ABNORMAL HIGH (ref 70–99)
Potassium: 5 mmol/L (ref 3.5–5.1)
Sodium: 132 mmol/L — ABNORMAL LOW (ref 135–145)

## 2021-05-15 LAB — CBC WITH DIFFERENTIAL/PLATELET
Abs Immature Granulocytes: 0.03 10*3/uL (ref 0.00–0.07)
Basophils Absolute: 0 10*3/uL (ref 0.0–0.1)
Basophils Relative: 0 %
Eosinophils Absolute: 0.4 10*3/uL (ref 0.0–0.5)
Eosinophils Relative: 5 %
HCT: 31.5 % — ABNORMAL LOW (ref 39.0–52.0)
Hemoglobin: 10.4 g/dL — ABNORMAL LOW (ref 13.0–17.0)
Immature Granulocytes: 0 %
Lymphocytes Relative: 17 %
Lymphs Abs: 1.2 10*3/uL (ref 0.7–4.0)
MCH: 28.6 pg (ref 26.0–34.0)
MCHC: 33 g/dL (ref 30.0–36.0)
MCV: 86.5 fL (ref 80.0–100.0)
Monocytes Absolute: 0.4 10*3/uL (ref 0.1–1.0)
Monocytes Relative: 6 %
Neutro Abs: 5.1 10*3/uL (ref 1.7–7.7)
Neutrophils Relative %: 72 %
Platelets: 325 10*3/uL (ref 150–400)
RBC: 3.64 MIL/uL — ABNORMAL LOW (ref 4.22–5.81)
RDW: 12.6 % (ref 11.5–15.5)
WBC: 7.1 10*3/uL (ref 4.0–10.5)
nRBC: 0 % (ref 0.0–0.2)

## 2021-05-15 LAB — LACTIC ACID, PLASMA: Lactic Acid, Venous: 0.6 mmol/L (ref 0.5–1.9)

## 2021-05-15 MED ORDER — CEPHALEXIN 500 MG PO CAPS: 500.0000 mg | ORAL_CAPSULE | Freq: Three times a day (TID) | ORAL | 0 refills | Status: DC

## 2021-05-15 MED ORDER — BACITRACIN ZINC 500 UNIT/GM EX OINT: 1.0000 "application " | TOPICAL_OINTMENT | Freq: Two times a day (BID) | CUTANEOUS | 0 refills | Status: DC

## 2021-05-15 MED ORDER — SODIUM CHLORIDE 0.9 % IV SOLN
1.0000 g | Freq: Once | INTRAVENOUS | Status: AC
  Administered 2021-05-15: 1 g via INTRAVENOUS
  Filled 2021-05-15: qty 10
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
## 2021-05-15 NOTE — ED Notes (Signed)
Pt did a self cath at this time. Pt appears to be agitated and keeps asking for his board, drink, and food. Wife at the bedside.
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
## 2021-05-17 LAB — URINE CULTURE: Culture: >=100000 — AB

## 2021-05-18 ENCOUNTER — Telehealth: Payer: Self-pay | Admitting: *Deleted

## 2021-05-18 NOTE — Telephone Encounter (Signed)
Post ED Visit - Positive Culture Follow-up  Culture report reviewed by antimicrobial stewardship pharmacist: [facility] []  Nathan Batchelder, .D. []  [place], Pharm.D., BCPS AQ-ID []  , Pharm.D., BCPS []  Celedonio Miyamoto, Pharm.D., BCPS []  [place], Garvin Fila.D., BCPS, AAHIVP []  , Pharm.D., BCPS, AAHIVP []  Georgina Pillion, PharmD, BCPS []  , PharmD, BCPS []  [place], PharmD, BCPS []  [place], PharmD []  , PharmD, BCPS []  Estella Husk, PharmD  Pharmacy Team []  Lysle Pearl, PharmD []  , PharmD []  Phillips Climes, PharmD []  , Rph []  Agapito Games) , PharmD []  Verlan Friends, PharmD []  , PharmD []  Mervyn Gay, PharmD []  , PharmD []  Vinnie Level, PharmD []  Wonda Olds, PharmD []  , PharmD []  Len Childs, PharmD   Positive urine culture Treated with Cephalexin, organism sensitive to the same and no further patient follow-up is required at this time.  [address], PharnD  Greer Pickerel [place] 05/18/2021, 12:35 PM

## 2021-05-30 ENCOUNTER — Encounter (HOSPITAL_BASED_OUTPATIENT_CLINIC_OR_DEPARTMENT_OTHER): Payer: Self-pay

## 2021-05-30 ENCOUNTER — Inpatient Hospital Stay (HOSPITAL_BASED_OUTPATIENT_CLINIC_OR_DEPARTMENT_OTHER)
Admission: EM | Admit: 2021-05-30 | Discharge: 2021-06-03 | DRG: 539 | Disposition: A | Discharge status: Home Health | Payer: No Typology Code available for payment source | Attending: Internal Medicine | Admitting: Internal Medicine

## 2021-05-30 ENCOUNTER — Other Ambulatory Visit: Payer: Self-pay

## 2021-05-30 ENCOUNTER — Emergency Department (HOSPITAL_BASED_OUTPATIENT_CLINIC_OR_DEPARTMENT_OTHER): Payer: No Typology Code available for payment source

## 2021-05-30 DIAGNOSIS — F32A Depression, unspecified: Secondary | ICD-10-CM | POA: Diagnosis present

## 2021-05-30 DIAGNOSIS — M86652 Other chronic osteomyelitis, left thigh: Secondary | ICD-10-CM | POA: Diagnosis present

## 2021-05-30 DIAGNOSIS — Z20822 Contact with and (suspected) exposure to covid-19: Secondary | ICD-10-CM | POA: Diagnosis present

## 2021-05-30 DIAGNOSIS — L89523 Pressure ulcer of left ankle, stage 3: Secondary | ICD-10-CM | POA: Diagnosis present

## 2021-05-30 DIAGNOSIS — L89220 Pressure ulcer of left hip, unstageable: Secondary | ICD-10-CM | POA: Diagnosis present

## 2021-05-30 DIAGNOSIS — F1990 Other psychoactive substance use, unspecified, uncomplicated: Secondary | ICD-10-CM

## 2021-05-30 DIAGNOSIS — L89022 Pressure ulcer of left elbow, stage 2: Secondary | ICD-10-CM | POA: Diagnosis present

## 2021-05-30 DIAGNOSIS — L8990 Pressure ulcer of unspecified site, unspecified stage: Secondary | ICD-10-CM | POA: Insufficient documentation

## 2021-05-30 DIAGNOSIS — G8250 Quadriplegia, unspecified: Secondary | ICD-10-CM | POA: Diagnosis present

## 2021-05-30 DIAGNOSIS — Z1623 Resistance to quinolones and fluoroquinolones: Secondary | ICD-10-CM | POA: Diagnosis present

## 2021-05-30 DIAGNOSIS — M869 Osteomyelitis, unspecified: Secondary | ICD-10-CM | POA: Diagnosis not present

## 2021-05-30 DIAGNOSIS — L89626 Pressure-induced deep tissue damage of left heel: Secondary | ICD-10-CM | POA: Diagnosis present

## 2021-05-30 DIAGNOSIS — L89512 Pressure ulcer of right ankle, stage 2: Secondary | ICD-10-CM | POA: Diagnosis present

## 2021-05-30 DIAGNOSIS — L89102 Pressure ulcer of unspecified part of back, stage 2: Secondary | ICD-10-CM | POA: Diagnosis present

## 2021-05-30 DIAGNOSIS — F17210 Nicotine dependence, cigarettes, uncomplicated: Secondary | ICD-10-CM | POA: Diagnosis present

## 2021-05-30 DIAGNOSIS — L89159 Pressure ulcer of sacral region, unspecified stage: Secondary | ICD-10-CM | POA: Diagnosis present

## 2021-05-30 DIAGNOSIS — Z818 Family history of other mental and behavioral disorders: Secondary | ICD-10-CM

## 2021-05-30 DIAGNOSIS — Z9884 Bariatric surgery status: Secondary | ICD-10-CM

## 2021-05-30 DIAGNOSIS — Z882 Allergy status to sulfonamides status: Secondary | ICD-10-CM

## 2021-05-30 DIAGNOSIS — A419 Sepsis, unspecified organism: Secondary | ICD-10-CM

## 2021-05-30 DIAGNOSIS — D509 Iron deficiency anemia, unspecified: Secondary | ICD-10-CM | POA: Diagnosis present

## 2021-05-30 DIAGNOSIS — L89320 Pressure ulcer of left buttock, unstageable: Secondary | ICD-10-CM | POA: Diagnosis present

## 2021-05-30 DIAGNOSIS — Z91018 Allergy to other foods: Secondary | ICD-10-CM | POA: Diagnosis not present

## 2021-05-30 DIAGNOSIS — F200 Paranoid schizophrenia: Secondary | ICD-10-CM | POA: Diagnosis present

## 2021-05-30 DIAGNOSIS — B182 Chronic viral hepatitis C: Secondary | ICD-10-CM | POA: Diagnosis present

## 2021-05-30 DIAGNOSIS — N319 Neuromuscular dysfunction of bladder, unspecified: Secondary | ICD-10-CM | POA: Diagnosis present

## 2021-05-30 DIAGNOSIS — F1910 Other psychoactive substance abuse, uncomplicated: Secondary | ICD-10-CM | POA: Diagnosis present

## 2021-05-30 DIAGNOSIS — Z91048 Other nonmedicinal substance allergy status: Secondary | ICD-10-CM | POA: Diagnosis not present

## 2021-05-30 DIAGNOSIS — R6510 Systemic inflammatory response syndrome (SIRS) of non-infectious origin without acute organ dysfunction: Secondary | ICD-10-CM | POA: Diagnosis present

## 2021-05-30 DIAGNOSIS — L89324 Pressure ulcer of left buttock, stage 4: Secondary | ICD-10-CM | POA: Diagnosis not present

## 2021-05-30 DIAGNOSIS — F419 Anxiety disorder, unspecified: Secondary | ICD-10-CM | POA: Diagnosis present

## 2021-05-30 DIAGNOSIS — Z79899 Other long term (current) drug therapy: Secondary | ICD-10-CM

## 2021-05-30 DIAGNOSIS — B965 Pseudomonas (aeruginosa) (mallei) (pseudomallei) as the cause of diseases classified elsewhere: Secondary | ICD-10-CM | POA: Diagnosis present

## 2021-05-30 DIAGNOSIS — F4310 Post-traumatic stress disorder, unspecified: Secondary | ICD-10-CM | POA: Diagnosis present

## 2021-05-30 HISTORY — DX: Other chronic osteomyelitis, left thigh: M86.652

## 2021-05-30 LAB — RETICULOCYTES
Immature Retic Fract: 18.5 % — ABNORMAL HIGH (ref 2.3–15.9)
RBC.: 3.17 MIL/uL — ABNORMAL LOW (ref 4.22–5.81)
Retic Count, Absolute: 26.6 10*3/uL (ref 19.0–186.0)
Retic Ct Pct: 0.8 % (ref 0.4–3.1)

## 2021-05-30 LAB — CBC WITH DIFFERENTIAL/PLATELET
Abs Immature Granulocytes: 0.02 10*3/uL (ref 0.00–0.07)
Basophils Absolute: 0 10*3/uL (ref 0.0–0.1)
Basophils Relative: 0 %
Eosinophils Absolute: 0.2 10*3/uL (ref 0.0–0.5)
Eosinophils Relative: 3 %
HCT: 26.3 % — ABNORMAL LOW (ref 39.0–52.0)
Hemoglobin: 8.5 g/dL — ABNORMAL LOW (ref 13.0–17.0)
Immature Granulocytes: 0 %
Lymphocytes Relative: 14 %
Lymphs Abs: 0.9 10*3/uL (ref 0.7–4.0)
MCH: 26.9 pg (ref 26.0–34.0)
MCHC: 32.3 g/dL (ref 30.0–36.0)
MCV: 83.2 fL (ref 80.0–100.0)
Monocytes Absolute: 0.4 10*3/uL (ref 0.1–1.0)
Monocytes Relative: 6 %
Neutro Abs: 5 10*3/uL (ref 1.7–7.7)
Neutrophils Relative %: 77 %
Platelets: 349 10*3/uL (ref 150–400)
RBC: 3.16 MIL/uL — ABNORMAL LOW (ref 4.22–5.81)
RDW: 13 % (ref 11.5–15.5)
WBC: 6.5 10*3/uL (ref 4.0–10.5)
nRBC: 0 % (ref 0.0–0.2)

## 2021-05-30 LAB — COMPREHENSIVE METABOLIC PANEL
ALT: 9 U/L (ref 0–44)
AST: 12 U/L — ABNORMAL LOW (ref 15–41)
Albumin: 3 g/dL — ABNORMAL LOW (ref 3.5–5.0)
Alkaline Phosphatase: 55 U/L (ref 38–126)
Anion gap: 8 (ref 5–15)
BUN: 14 mg/dL (ref 6–20)
CO2: 24 mmol/L (ref 22–32)
Calcium: 8.2 mg/dL — ABNORMAL LOW (ref 8.9–10.3)
Chloride: 102 mmol/L (ref 98–111)
Creatinine, Ser: 0.63 mg/dL (ref 0.61–1.24)
GFR, Estimated: >60 mL/min (ref >60)
Glucose, Bld: 94 mg/dL (ref 70–99)
Potassium: 3.9 mmol/L (ref 3.5–5.1)
Sodium: 134 mmol/L — ABNORMAL LOW (ref 135–145)
Total Bilirubin: 0.2 mg/dL — ABNORMAL LOW (ref 0.3–1.2)
Total Protein: 6.7 g/dL (ref 6.5–8.1)

## 2021-05-30 LAB — IRON AND TIBC
Iron: 10 ug/dL — ABNORMAL LOW (ref 45–182)
Saturation Ratios: 5 % — ABNORMAL LOW (ref 17.9–39.5)
TIBC: 189 ug/dL — ABNORMAL LOW (ref 250–450)
UIBC: 179 ug/dL

## 2021-05-30 LAB — RESP PANEL BY RT-PCR (FLU A&B, COVID) ARPGX2
Influenza A by PCR: NEGATIVE
Influenza B by PCR: NEGATIVE
SARS Coronavirus 2 by RT PCR: NEGATIVE

## 2021-05-30 LAB — LACTIC ACID, PLASMA: Lactic Acid, Venous: 1.2 mmol/L (ref 0.5–1.9)

## 2021-05-30 MED ORDER — ACETAMINOPHEN 325 MG PO TABS: 650.0000 mg | ORAL_TABLET | Freq: Once | ORAL | Status: DC

## 2021-05-30 MED ORDER — KETOROLAC TROMETHAMINE 30 MG/ML IJ SOLN
30.0000 mg | Freq: Four times a day (QID) | INTRAMUSCULAR | Status: DC | PRN
  Administered 2021-05-30 – 2021-06-02 (×4): 30 mg via INTRAVENOUS
  Filled 2021-05-30 (×4): qty 1

## 2021-05-30 MED ORDER — VANCOMYCIN HCL 1500 MG/300ML IV SOLN
1500.0000 mg | Freq: Two times a day (BID) | INTRAVENOUS | Status: DC
  Administered 2021-05-30 – 2021-05-31 (×2): 1500 mg via INTRAVENOUS
  Filled 2021-05-30 (×3): qty 300

## 2021-05-30 MED ORDER — CYCLOBENZAPRINE HCL 5 MG PO TABS
5.0000 mg | ORAL_TABLET | Freq: Three times a day (TID) | ORAL | Status: AC | PRN
  Filled 2021-05-30: qty 1

## 2021-05-30 MED ORDER — VANCOMYCIN HCL IN DEXTROSE 1-5 GM/200ML-% IV SOLN
1000.0000 mg | Freq: Once | INTRAVENOUS | Status: AC
  Administered 2021-05-30: 1000 mg via INTRAVENOUS
  Filled 2021-05-30: qty 200

## 2021-05-30 MED ORDER — ACETAMINOPHEN 500 MG PO TABS
1000.0000 mg | ORAL_TABLET | Freq: Once | ORAL | Status: AC
  Administered 2021-05-30: 1000 mg via ORAL
  Filled 2021-05-30: qty 2

## 2021-05-30 MED ORDER — LORAZEPAM 1 MG PO TABS
1.0000 mg | ORAL_TABLET | Freq: Once | ORAL | Status: AC
  Administered 2021-05-30: 1 mg via ORAL
  Filled 2021-05-30: qty 1

## 2021-05-30 MED ORDER — ENOXAPARIN SODIUM 40 MG/0.4ML IJ SOSY
40.0000 mg | PREFILLED_SYRINGE | INTRAMUSCULAR | Status: DC
  Administered 2021-05-30 – 2021-06-01 (×3): 40 mg via SUBCUTANEOUS
  Filled 2021-05-30 (×3): qty 0.4

## 2021-05-30 MED ORDER — PIPERACILLIN-TAZOBACTAM 3.375 G IVPB
3.3750 g | Freq: Three times a day (TID) | INTRAVENOUS | Status: DC
  Administered 2021-05-30 – 2021-05-31 (×2): 3.375 g via INTRAVENOUS
  Filled 2021-05-30 (×3): qty 50

## 2021-05-30 MED ORDER — VANCOMYCIN HCL 1500 MG/300ML IV SOLN
1500.0000 mg | Freq: Three times a day (TID) | INTRAVENOUS | Status: DC
  Filled 2021-05-30: qty 300

## 2021-05-30 MED ORDER — NICOTINE 21 MG/24HR TD PT24
21.0000 mg | MEDICATED_PATCH | Freq: Once | TRANSDERMAL | Status: AC
  Administered 2021-05-30: 21 mg via TRANSDERMAL
  Filled 2021-05-30: qty 1

## 2021-05-30 MED ORDER — ACETAMINOPHEN 325 MG PO TABS
650.0000 mg | ORAL_TABLET | Freq: Four times a day (QID) | ORAL | Status: DC | PRN
  Filled 2021-05-30 (×2): qty 2

## 2021-05-30 MED ORDER — IOHEXOL 350 MG/ML SOLN
100.0000 mL | Freq: Once | INTRAVENOUS | Status: AC | PRN
  Administered 2021-05-30: 75 mL via INTRAVENOUS

## 2021-05-30 NOTE — ED Notes (Signed)
Handoff report given to care link and to floor RN on 1M at [facility].  Care link at bed side
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
## 2021-05-30 NOTE — Progress Notes (Signed)
Pharmacy Antibiotic Note  Steve [name] is a 30 y.o. male admitted on 05/30/2021 with a wound infection; presented with wound on buttocks increasing in size and foul smelling. CT suggested chronic osteomyelitis. Pharmacy was consulted for vancomycin dosing earlier today. Pharmacy is now consulted for Zosyn dosing for wound infection.    WBC 6.5, Tmax 101 F; Scr 0.63, CrCl 142 ml/min  Plan: Zosyn 3.375 gm IV Q 8 hrs (extended infusion) Continue vancomycin 1500 mg IV Q 12 hrs Monitor WBC, temp, clinical course, cultures, renal function, vancomycin levels  Height: 6' (182.9 cm) Weight: 75 kg (165 lb 5.5 oz) IBW/kg (Calculated) : 77.6  Temp (24hrs), Avg:99.5 F (37.5 C), Min:97.5 F (36.4 C), Max:101 F (38.3 C)  Recent Labs  Lab 05/30/21 0231  WBC 6.5  CREATININE 0.63  LATICACIDVEN 1.2     Estimated Creatinine Clearance: 141.9 mL/min (by C-G formula based on SCr of 0.63 mg/dL).    Allergies  Allergen Reactions   Caffeine Anaphylaxis   Caffeine Anaphylaxis   Chocolate Anaphylaxis   Chocolate Anaphylaxis   Sulfa Antibiotics Rash   Sulfa Antibiotics Rash   Antimicrobial Therapy This Admission: Vancomycin 9/13 >> Zosyn 9/13 >>  Microbiology Data: 9/13 COVID, flu A, flu B: negative 9/13 Bld cx X 2: pending 9/13 Wound cx: pending  Thank you for allowing pharmacy to be a part of this patient's care.  Vicki Mallet, PharmD, BCPS, [facility] Clinical Pharmacist 05/30/2021 5:41 PM

## 2021-05-30 NOTE — Progress Notes (Signed)
Pharmacy Antibiotic Note  LAWYER WASHABAUGH is a 30 y.o. male admitted on 05/30/2021 with  wound infection .  Pharmacy has been consulted for vancomycin dosing.  Patient with tmax of 99.7, wbc normal, renal function normal. Presents with wound on buttocks that is increasing in size and foul smelling. CT suggest chronic osteomyelitis.   Vancomycin 1500 mg IV Q 8 hrs. Goal AUC 400-550. Expected AUC: 499 SCr used: 0.6   Plan: Vancomycin 2g IV x1 then 1500mg  IV every 8 hours.  Goal trough 15-20 mcg/mL. Would check trough at steady statin in next 48 hours  Height: 6' (182.9 cm) Weight: 75 kg (165 lb 5.5 oz) IBW/kg (Calculated) : 77.6  Temp (24hrs), Avg:99.7 F (37.6 C), Min:99.7 F (37.6 C), Max:99.7 F (37.6 C)  Recent Labs  Lab 05/30/21 0231  WBC 6.5  CREATININE 0.63  LATICACIDVEN 1.2    Estimated Creatinine Clearance: 141.9 mL/min (by C-G formula based on SCr of 0.63 mg/dL).    Allergies  Allergen Reactions   Caffeine Anaphylaxis   Caffeine Anaphylaxis   Chocolate Anaphylaxis   Chocolate Anaphylaxis   Sulfa Antibiotics Rash   Sulfa Antibiotics Rash    Thank you for allowing pharmacy to be a part of this patient's care.  06/01/21 PharmD., BCPS Clinical Pharmacist 05/30/2021 5:13 AM

## 2021-05-30 NOTE — ED Notes (Signed)
Patient transported to CT 

## 2021-05-30 NOTE — ED Notes (Signed)
Patient returned from CT

## 2021-05-30 NOTE — ED Notes (Signed)
Patient provided with [place] Cloud, Saltine Crackers, Apple Sauce, and Cheese. Patient resting at this time with [place] Bell within [place]. Staff will continue to monitor.

## 2021-05-30 NOTE — Progress Notes (Signed)
Pharmacy Antibiotic Note  Steve [name] is a 30 y.o. male admitted on 05/30/2021 with  wound infection .  Pharmacy has been consulted for vancomycin dosing. Patient with tmax of 101, wbc normal, renal function normal. Presents with wound on buttocks that is increasing in size and foul smelling. CT suggest chronic osteomyelitis.   Plan: Change vancomycin to 1500mg  IV Q12H F/u renal fxn, C&S, clinical status and peak/trough at SS  Height: 6' (182.9 cm) Weight: 75 kg (165 lb 5.5 oz) IBW/kg (Calculated) : 77.6  Temp (24hrs), Avg:100.2 F (37.9 C), Min:99.7 F (37.6 C), Max:101 F (38.3 C)  Recent Labs  Lab 05/30/21 0231  WBC 6.5  CREATININE 0.63  LATICACIDVEN 1.2     Estimated Creatinine Clearance: 141.9 mL/min (by C-G formula based on SCr of 0.63 mg/dL).    Allergies  Allergen Reactions   Caffeine Anaphylaxis   Caffeine Anaphylaxis   Chocolate Anaphylaxis   Chocolate Anaphylaxis   Sulfa Antibiotics Rash   Sulfa Antibiotics Rash    Thank you for allowing pharmacy to be a part of this patient's care.  06/01/21, PharmD, BCPS Clinical Pharmacist Please see AMION for all pharmacy numbers 05/30/2021 8:03 AM

## 2021-05-30 NOTE — ED Notes (Addendum)
Patient becoming agitated at this time stating he would like to leave and smoke a cigarette outside and also leave the Department and go to the [facility]". This RN informed this Patient he is currently waiting for Admission into Hospital and cannot go outside and smoke a cigarette. Patient became increasingly agitated with MD and Visitor.  Patient VSS. Visitor remains at Bedside. Visitor requested to have Medication to help Patient ease Agitation and Anxiety. RN will inform MD of request. Staff will continue to monitor.

## 2021-05-30 NOTE — ED Triage Notes (Signed)
Patient here POV from Home with Wound.  Patient states he has had a Sore developing on Left Buttock for Months but approximately 1 week PTA he began irritating it due to Movement creating the Wound. Wound is approximately 10 cm Wide and 3-5 cm Deep.   Wheelchair Bound at [employer]. A&Ox4. GCS 15. NAD Noted during Triage.
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
## 2021-05-30 NOTE — ED Notes (Signed)
Provide drinks

## 2021-05-31 DIAGNOSIS — L89324 Pressure ulcer of left buttock, stage 4: Secondary | ICD-10-CM

## 2021-05-31 DIAGNOSIS — M869 Osteomyelitis, unspecified: Secondary | ICD-10-CM

## 2021-05-31 DIAGNOSIS — L8990 Pressure ulcer of unspecified site, unspecified stage: Secondary | ICD-10-CM | POA: Insufficient documentation

## 2021-05-31 DIAGNOSIS — G8250 Quadriplegia, unspecified: Secondary | ICD-10-CM

## 2021-05-31 DIAGNOSIS — A419 Sepsis, unspecified organism: Secondary | ICD-10-CM | POA: Diagnosis not present

## 2021-05-31 DIAGNOSIS — F1910 Other psychoactive substance abuse, uncomplicated: Secondary | ICD-10-CM | POA: Diagnosis not present

## 2021-05-31 LAB — RAPID URINE DRUG SCREEN, HOSP PERFORMED
Amphetamines: POSITIVE — AB
Barbiturates: NOT DETECTED
Benzodiazepines: NOT DETECTED
Cocaine: NOT DETECTED
Opiates: NOT DETECTED
Tetrahydrocannabinol: NOT DETECTED

## 2021-05-31 LAB — MRSA NEXT GEN BY PCR, NASAL: MRSA by PCR Next Gen: DETECTED — AB

## 2021-05-31 LAB — CBC
HCT: 25.4 % — ABNORMAL LOW (ref 39.0–52.0)
Hemoglobin: 8.1 g/dL — ABNORMAL LOW (ref 13.0–17.0)
MCH: 27 pg (ref 26.0–34.0)
MCHC: 31.9 g/dL (ref 30.0–36.0)
MCV: 84.7 fL (ref 80.0–100.0)
Platelets: 295 10*3/uL (ref 150–400)
RBC: 3 MIL/uL — ABNORMAL LOW (ref 4.22–5.81)
RDW: 12.9 % (ref 11.5–15.5)
WBC: 6.3 10*3/uL (ref 4.0–10.5)
nRBC: 0 % (ref 0.0–0.2)

## 2021-05-31 LAB — FOLATE: Folate: 6.8 ng/mL (ref >5.9)

## 2021-05-31 LAB — VITAMIN B12: Vitamin B-12: 241 pg/mL (ref 180–914)

## 2021-05-31 MED ORDER — SODIUM CHLORIDE 0.9 % IV SOLN
3.0000 g | Freq: Four times a day (QID) | INTRAVENOUS | Status: DC
  Administered 2021-05-31 (×2): 3 g via INTRAVENOUS
  Filled 2021-05-31 (×3): qty 8

## 2021-05-31 MED ORDER — VANCOMYCIN HCL 1500 MG/300ML IV SOLN
1500.0000 mg | Freq: Two times a day (BID) | INTRAVENOUS | Status: DC
  Administered 2021-06-01 – 2021-06-02 (×2): 1500 mg via INTRAVENOUS
  Filled 2021-05-31 (×5): qty 300

## 2021-05-31 MED ORDER — PANTOPRAZOLE SODIUM 40 MG IV SOLR
40.0000 mg | Freq: Once | INTRAVENOUS | Status: AC
  Administered 2021-05-31: 40 mg via INTRAVENOUS
  Filled 2021-05-31: qty 40

## 2021-05-31 MED ORDER — DOCUSATE SODIUM 100 MG PO CAPS
100.0000 mg | ORAL_CAPSULE | Freq: Two times a day (BID) | ORAL | Status: DC
  Filled 2021-05-31 (×6): qty 1

## 2021-05-31 MED ORDER — FERROUS SULFATE 325 (65 FE) MG PO TABS
325.0000 mg | ORAL_TABLET | Freq: Two times a day (BID) | ORAL | Status: DC
  Administered 2021-05-31 – 2021-06-02 (×6): 325 mg via ORAL
  Filled 2021-05-31 (×5): qty 1

## 2021-05-31 NOTE — Consult Note (Addendum)
WOC Nurse Consult Note: Reason for Consult: Consult requested for left hip wound.   Wound type: Chronic Stage 4 pressure injury Pressure Injury POA: Yes Measurement: 10X10X5cm when swab inserted deeply in the center, bone is palpable.  80% red, 20% black eschar in the center, mod amt green drainage with strong odor. Dressing procedure/placement/frequency: Pt is on a low airloss mattress to reduce pressure. CT scan indicates osteomyelitis; this complex medical condition is beyond the scope of practice for WOC nurses.  Progress notes indicate that an ortho consult is pending to determine further plan of care.   Topical treatment orders provided for bedside nurses to perform as follows until further orders provided by the ortho team: Apply Aquacel to left hip wound Q day, packing into center of wound and covering the rest, then apply foam dressing.  (Change foam dressing Q 3 days or PRN soiling.) Please re-consult if further assistance is needed.  Thank-you,  Cammie Mcgee MSN, RN, CWOCN, [place], CNS [phone]
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
## 2021-05-31 NOTE — TOC Progression Note (Signed)
Transition of Care [facility]) - Progression Note    Patient Details  Name: Steve [name] MRN: [id number] Date of Birth: [birth year]  [facility] of [facility]) CM/SW Contact  Okey Dupre Lazaro Arms, LCSW Phone Number: 05/31/2021, 3:08 PM  Clinical Narrative:  Talked with patient's wife-Steve [name] regarding her need to get a couple of checks cashed, as they are moving and need the money for utilities to be turned on, etc. She explained that her husband has a Veterinary surgeon with [employer] in [place] who assists in handling his finances and paying his bills. The person who works with him generated the needed checks, but they are in his name and she can't cash them. They have been trying to reach the person today without success. This issue and their move was discussed and CSW suggested that she or her husband call his bank and determine if they will video chat with him and cash the checks since he is in the hospital. [name] let CSW know later that the bank will not cash the checks unless he is in-person at the bank.  CSW will continue to follow an assist as needed.         Expected Discharge Plan and Services                                                 Social Determinants of Health (SDOH) Interventions    Readmission Risk Interventions No flowsheet data found.

## 2021-05-31 NOTE — Progress Notes (Signed)
Pharmacy Antibiotic Note  Steve [name] is a 30 y.o. male admitted on 05/30/2021 with  osteo .  Pharmacy has been consulted for Vanco, Unasyn dosing.  CC/HPI: osteo/wound infection buttocks, L hip.  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, trached, bed-bound,Methamphetamine use/marijuana use/tobacco use , alcohol, hx hep c ab positive, self cath for chronic urinary retention, IDA,  Neurogenic bladder ,   Left elbow stage 2 pressure ulcer, POA Left heel deep tissue injury, POA Two RIght back, stage 2 pressure injuries POA Left ankle stage 3 pressure injuyr, POA RIght ankle, stage 2 pressure injury, POA   ID: Vanc D#1 for osteo L gluteal wound - Tmax 100.5, WBC WNL, SCr 0.63, LA 1.2  - CT suggests chronic osteo  Vanc 9/13>> Zosyn 9/13 Unasyn 9/14>>  9/14: MRSA PCR: neg 9/13: L buttocks: GPC, GNR, 9/13: BC x 2>>   Plan:  Resume previous Vanc to 1500mg  IV Q12H (AUC 455, SCr 0.8)-no missed doses. Unasyn 3g IV q6hr Will start vancomycin and Unasyn with plan to transition to Doxyc/Augmentin at discharge.   Treat 2 weeks for soft tissue infectious process     Height: 6' (182.9 cm) Weight: 75 kg (165 lb 5.5 oz) IBW/kg (Calculated) : 77.6  Temp (24hrs), Avg:99.2 F (37.3 C), Min:98.6 F (37 C), Max:100.5 F (38.1 C)  Recent Labs  Lab 05/30/21 0231 05/31/21 0410  WBC 6.5 6.3  CREATININE 0.63  --   LATICACIDVEN 1.2  --     Estimated Creatinine Clearance: 141.9 mL/min (by C-G formula based on SCr of 0.63 mg/dL).    Allergies  Allergen Reactions   Caffeine Anaphylaxis   Caffeine Anaphylaxis   Chocolate Anaphylaxis   Chocolate Anaphylaxis   Sulfa Antibiotics Rash   Sulfa Antibiotics Rash    Elber Galyean S. 06/02/21, PharmD, BCPS Clinical Staff Pharmacist [email or web address]  Merilynn Finland 05/31/2021 5:30 PM

## 2021-05-31 NOTE — Progress Notes (Signed)
PT Cancellation Note  Patient Details Name: TAJ NEVINS MRN: [id number] DOB: Oct 21, 1990   Cancelled Treatment:    Reason Eval/Treat Not Completed: Patient declined, no reason specified. PT attempts to see patient for evaluation, pt declining at this time, reports feeling unwell. Pt requests PT return tomorrow.   Arlyss Gandy 05/31/2021, 1:43 PM

## 2021-05-31 NOTE — Consult Note (Signed)
[facility] Surgery Consult Note  Steve [name] 1990/11/21  [id number].    Requesting MD: Myrene Buddy Chief Complaint:  left buttocks/hip decubitus Reason for Consult: same  HPI:  Patient is a 30 year old male with chronic paraplegia secondary to a broken neck, with trach, pt reports this occurred last year in [place]. Hx of ongoing   He says he gets most of his care at the [place]. Patient can apparently transfer himself to bed to chair but had a fall approximately 2 weeks ago his left hip with a skin opening.  Wound has become progressively worse, he has had fever and chills.  Work-up: Is a low-grade fever today of 100.5, other vital signs are stable. CMP is unremarkable except for low albumin, WBC 6.5, H/H 8.5/26.3, platelets 349,000.  Drug screens negative except for amphetamines.  Respiratory panel is negative.  He is MRSA positive on PCR.  CT of the abdomen and pelvis with contrast yesterday shows a large left medial gluteal ulcer that approximates the left inferior pubic ramus suggesting chronic osteomyelitis.  No cortical destruction was evident on CT there is some associated myositis involving the left gluteus maximus muscle.  CT of the elbow for swelling and redness shows extensive superficial soft tissue swelling along the elbow most pronounced posteriorly along the olecranon patible with bursitis.  There is a small foci of air and higher density material posteriorly which could represent open wound/draining sinus tract.  There is no well-defined fluid collection large joint effusion or osseous changes to suggest osteomyelitis.  He was seen by wound care ostomy nurse today.  He has a 10 x 10 x 5 cm ulcer with palpable bone left hip 80% red 20% black eschar in the center with a moderate amount of green drainage and strong odor.  Apparently has a Ortho consult also pending presumably for his elbow.  We are asked to see. ROS: Review of Systems  Constitutional:  Positive for  fever and weight loss (He has had a huge weight loss from when he was in the [employer], with prior panectomy).  HENT: Negative.    Eyes: Negative.   Respiratory: Negative.    Cardiovascular: Negative.   Gastrointestinal: Negative.   Genitourinary:        He self caths  Musculoskeletal:        No feeling below the waist  Skin:        He has skin ulcers to right side and back , both lower extremities, and left buttocks.  Neurological:        Paraplegia; he has good upper extremity strength and moves himself and his legs well  Endo/Heme/Allergies: Negative.   Psychiatric/Behavioral:         Hx of anxiety, PTSD, schizophrenia, drug use.   Family History  Problem Relation Age of Onset   Depression Mother    Bipolar disorder Father     Past Medical History:  Diagnosis Date   Anxiety    Paranoid schizophrenia ([place])    PTSD (post-traumatic stress disorder) Paranoid    Past Surgical History:  Procedure Laterality Date   banding procedure for morbid obesity     I & D EXTREMITY Right 02/27/2014   Procedure: IRRIGATION AND DEBRIDEMENT FOREARM AND REPAIR OF 30cm LACERATION;  Surgeon: Johnny Bridge, MD;  Location: [facility];  Service: Orthopedics;  Laterality: Right;  Anesthesia Regional with [facility]    Social History:  reports that he has been smoking cigarettes. He has been smoking an average of .  25 packs per day. He has never used smokeless tobacco. He reports that he does not currently use alcohol. He reports current drug use. Drugs: Marijuana and Methamphetamines. Tobacco:  ongoing use EtOH:  none Drugs:  Methadone q2days, Marijuana  Allergies:  Allergies  Allergen Reactions   Caffeine Anaphylaxis   Caffeine Anaphylaxis   Chocolate Anaphylaxis   Chocolate Anaphylaxis   Sulfa Antibiotics Rash   Sulfa Antibiotics Rash    Medications Prior to Admission  Medication Sig Dispense Refill   cephALEXin (KEFLEX) 500 MG capsule Take 1 capsule (500 mg total) by mouth 3 (three) times daily.  20 capsule 0   bacitracin ointment Apply 1 application topically 2 (two) times daily. (Patient not taking: No sig reported) 120 g 0    Anti-infectives (From admission, onward)    Start     Dose/Rate Route Frequency Ordered Stop   05/30/21 1815  piperacillin-tazobactam (ZOSYN) IVPB 3.375 g  Status:  Discontinued        3.375 g 12.5 mL/hr over 240 Minutes Intravenous Every 8 hours 05/30/21 1751 05/31/21 0743   05/30/21 1800  vancomycin (VANCOREADY) IVPB 1500 mg/300 mL  Status:  Discontinued        1,500 mg 150 mL/hr over 120 Minutes Intravenous Every 8 hours 05/30/21 0512 05/30/21 0803   05/30/21 1800  vancomycin (VANCOREADY) IVPB 1500 mg/300 mL  Status:  Discontinued        1,500 mg 150 mL/hr over 120 Minutes Intravenous Every 12 hours 05/30/21 0803 05/31/21 0743   05/30/21 1400  vancomycin (VANCOREADY) IVPB 1500 mg/300 mL  Status:  Discontinued        1,500 mg 150 mL/hr over 120 Minutes Intravenous Every 8 hours 05/30/21 0507 05/30/21 0512   05/30/21 0630  vancomycin (VANCOCIN) IVPB 1000 mg/200 mL premix        1,000 mg 200 mL/hr over 60 Minutes Intravenous  Once 05/30/21 0511 05/30/21 0746   05/30/21 0515  vancomycin (VANCOCIN) IVPB 1000 mg/200 mL premix        1,000 mg 200 mL/hr over 60 Minutes Intravenous  Once 05/30/21 0507 05/30/21 0638        Blood pressure 120/64, pulse 91, temperature (!) 100.5 F (38.1 C), temperature source Oral, resp. rate 20, height 6' (1.829 m), weight 75 kg, SpO2 97 %. Physical Exam:  General: pleasant, paraplegic thin white male who is laying in bed, self cathing HEENT: head is normocephalic, atraumatic.  Neck:  well healed trachea scar Sclera are noninjected.  Pupils are equal. Ears and nose without any masses or lesions.  Mouth is pink and moist Heart: regular, rate, and rhythm.  Normal s1,s2. No obvious murmurs, gallops, or rubs noted.  Palpable radial and pedal pulses bilaterally Lungs: CTAB, no wheezes, rhonchi, or rales noted.  Respiratory  effort nonlabored Abd: soft, NT, ND, +BS, no masses, hernias, or organomegaly. 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 MS: paraplegic, good strength upper extremities.   Skin: multiple skin ulcers; left elbow, right flank and back, ulcers both lower extremities, left buttocks: 10 x 10 x 6 cm ulcer, this goes down to the bone in the mid portion of the ulcer.   Neuro: Cranial nerves 2-12 grossly intact, sensation is normal throughout Psych: A&Ox3 with an appropriate affect.   Right flank  Right back  Left elbow, red and somewhat tender, but he uses both arms to move well  Left buttocks  Left buttocks Results for  orders placed or performed during the hospital encounter of 05/30/21 (from the past 48 hour(s))  Culture, blood (Routine X 2) w Reflex to ID Panel     Status: None (Preliminary result)   Collection Time: 05/30/21  2:30 AM   Specimen: BLOOD  Result Value Ref Range   Specimen Description      BLOOD BOTTLES DRAWN AEROBIC AND ANAEROBIC Performed at [facility], [address], [place], [place] [address]    Special Requests      Blood Culture adequate volume BLOOD RIGHT ARM Performed at [facility], [address], [place], [place] [address]    Culture      NO GROWTH < 24 HOURS Performed at [facility], [place] [address] [place], [place] [address]    Report Status PENDING   CBC with Differential/Platelet     Status: Abnormal   Collection Time: 05/30/21  2:31 AM  Result Value Ref Range   WBC 6.5 4.0 - 10.5 K/uL   RBC 3.16 (L) 4.22 - 5.81 MIL/uL   Hemoglobin 8.5 (L) 13.0 - 17.0 g/dL   HCT 26.3 (L) 39.0 - 52.0 %   MCV 83.2 80.0 - 100.0 fL   MCH 26.9 26.0 - 34.0 pg   MCHC 32.3 30.0 - 36.0 g/dL   RDW 13.0 11.5 - 15.5 %   Platelets 349 150 - 400 K/uL   nRBC 0.0 0.0 - 0.2 %   Neutrophils Relative % 77 %   Neutro Abs 5.0 1.7 - 7.7 K/uL   Lymphocytes Relative 14 %   Lymphs Abs  0.9 0.7 - 4.0 K/uL   Monocytes Relative 6 %   Monocytes Absolute 0.4 0.1 - 1.0 K/uL   Eosinophils Relative 3 %   Eosinophils Absolute 0.2 0.0 - 0.5 K/uL   Basophils Relative 0 %   Basophils Absolute 0.0 0.0 - 0.1 K/uL   Immature Granulocytes 0 %   Abs Immature Granulocytes 0.02 0.00 - 0.07 K/uL    Comment: Performed at [facility], [place], [place] [address]  Comprehensive metabolic panel     Status: Abnormal   Collection Time: 05/30/21  2:31 AM  Result Value Ref Range   Sodium 134 (L) 135 - 145 mmol/L   Potassium 3.9 3.5 - 5.1 mmol/L   Chloride 102 98 - 111 mmol/L   CO2 24 22 - 32 mmol/L   Glucose, Bld 94 70 - 99 mg/dL    Comment: Glucose reference range applies only to samples taken after fasting for at least 8 hours.   BUN 14 6 - 20 mg/dL   Creatinine, Ser 0.63 0.61 - 1.24 mg/dL   Calcium 8.2 (L) 8.9 - 10.3 mg/dL   Total Protein 6.7 6.5 - 8.1 g/dL   Albumin 3.0 (L) 3.5 - 5.0 g/dL   AST 12 (L) 15 - 41 U/L   ALT 9 0 - 44 U/L   Alkaline Phosphatase 55 38 - 126 U/L   Total Bilirubin 0.2 (L) 0.3 - 1.2 mg/dL   GFR, Estimated >60 >60 mL/min    Comment: (NOTE) Calculated using the CKD-EPI Creatinine Equation (2021)    Anion gap 8 5 - 15    Comment: Performed at [facility], [place], [place] [address]  Lactic acid, plasma     Status: None   Collection Time: 05/30/21  2:31 AM  Result Value Ref Range   Lactic Acid, Venous 1.2 0.5 - 1.9 mmol/L  Comment: Performed at [facility], [address], [place], [place] [address]  Aerobic Culture w Gram Stain (superficial specimen)     Status: None (Preliminary result)   Collection Time: 05/30/21  2:31 AM   Specimen: Buttocks  Result Value Ref Range   Specimen Description      BUTTOCKS LEFT Performed at [facility], [address], [place], [place] [address]    Special Requests      NONE Performed at [facility], [place], [place] [address]    Gram Stain      FEW SQUAMOUS EPITHELIAL CELLS PRESENT FEW WBC SEEN MODERATE GRAM NEGATIVE RODS FEW GRAM POSITIVE COCCI    Culture      CULTURE REINCUBATED FOR BETTER GROWTH Performed at [facility], [place] [address], [place], [place] [address]    Report Status PENDING   Culture, blood (Routine X 2) w Reflex to ID Panel     Status: None (Preliminary result)   Collection Time: 05/30/21  2:35 AM   Specimen: BLOOD  Result Value Ref Range   Specimen Description      BLOOD BOTTLES DRAWN AEROBIC AND ANAEROBIC Performed at [facility], [address] [place], [place] [address]    Special Requests      Blood Culture adequate volume LEFT ANTECUBITAL Performed at [facility], [address] [place], [place] [address]    Culture      NO GROWTH < 24 HOURS Performed at [facility], [place] [address] [place],  [address]    Report Status PENDING   Resp Panel by RT-PCR (Flu A&B, Covid) Nasopharyngeal Swab     Status: None   Collection Time: 05/30/21  4:21 AM   Specimen: Nasopharyngeal Swab; Nasopharyngeal(NP) swabs in vial transport medium  Result Value Ref Range   SARS Coronavirus 2 by RT PCR NEGATIVE NEGATIVE    Comment: (NOTE) SARS-CoV-2 target nucleic acids are NOT DETECTED.  The SARS-CoV-2 RNA is generally detectable in upper respiratory specimens during the acute phase of infection. The lowest concentration of SARS-CoV-2 viral copies this assay can detect is 138 copies/mL. A negative result does not preclude SARS-Cov-2 infection and should not be used as the sole basis for treatment or other patient management decisions. A negative result may occur with  improper specimen collection/handling, submission of specimen other than nasopharyngeal swab, presence of viral mutation(s) within the areas targeted by this assay, and inadequate number of viral copies(<138  copies/mL). A negative result must be combined with clinical observations, patient history, and epidemiological information. The expected result is Negative.  Fact Sheet for Patients:  EntrepreneurPulse.com.au  Fact Sheet for Healthcare Providers:  IncredibleEmployment.be  This test is no t yet approved or cleared by the [place] FDA and  has been authorized for detection and/or diagnosis of SARS-CoV-2 by FDA under an Emergency Use Authorization (EUA). This EUA will remain  in effect (meaning this test can be used) for the duration of the COVID-19 declaration under Section 564(b)(1) of the Act, 21 U.S.C.section 360bbb-3(b)(1), unless the authorization is terminated  or revoked sooner.       Influenza A by PCR NEGATIVE NEGATIVE   Influenza B by PCR NEGATIVE NEGATIVE    Comment: (NOTE) The Xpert Xpress SARS-CoV-2/FLU/RSV plus assay is intended as an aid in the diagnosis of influenza from Nasopharyngeal swab specimens and should not be used as a sole basis for treatment. Nasal washings and aspirates are unacceptable for  Xpert Xpress SARS-CoV-2/FLU/RSV testing.  Fact Sheet for Patients: EntrepreneurPulse.com.au  Fact Sheet for Healthcare Providers: IncredibleEmployment.be  This test is not yet approved or cleared by the [place] FDA and has been authorized for detection and/or diagnosis of SARS-CoV-2 by FDA under an Emergency Use Authorization (EUA). This EUA will remain in effect (meaning this test can be used) for the duration of the COVID-19 declaration under Section 564(b)(1) of the Act, 21 U.S.C. section 360bbb-3(b)(1), unless the authorization is terminated or revoked.  Performed at [facility], [place], [place] [address]   Iron and TIBC     Status: Abnormal   Collection Time: 05/30/21  6:01 PM  Result Value Ref Range   Iron 10 (L) 45 - 182 ug/dL    TIBC 189 (L) 250 - 450 ug/dL   Saturation Ratios 5 (L) 17.9 - 39.5 %   UIBC 179 ug/dL    Comment: Performed at [facility] [address] [place], [place] [address]  Reticulocytes     Status: Abnormal   Collection Time: 05/30/21  6:01 PM  Result Value Ref Range   Retic Ct Pct 0.8 0.4 - 3.1 %   RBC. 3.17 (L) 4.22 - 5.81 MIL/uL   Retic Count, Absolute 26.6 19.0 - 186.0 K/uL   Immature Retic Fract 18.5 (H) 2.3 - 15.9 %    Comment: Performed at [facility] [address] [place], [place] [address]  CBC     Status: Abnormal   Collection Time: 05/31/21  4:10 AM  Result Value Ref Range   WBC 6.3 4.0 - 10.5 K/uL   RBC 3.00 (L) 4.22 - 5.81 MIL/uL   Hemoglobin 8.1 (L) 13.0 - 17.0 g/dL   HCT 25.4 (L) 39.0 - 52.0 %   MCV 84.7 80.0 - 100.0 fL   MCH 27.0 26.0 - 34.0 pg   MCHC 31.9 30.0 - 36.0 g/dL   RDW 12.9 11.5 - 15.5 %   Platelets 295 150 - 400 K/uL   nRBC 0.0 0.0 - 0.2 %    Comment: Performed at [facility], [place] [address], [place], [place] [address]  Vitamin B12     Status: None   Collection Time: 05/31/21  4:10 AM  Result Value Ref Range   Vitamin B-12 241 180 - 914 pg/mL    Comment: (NOTE) This assay is not validated for testing neonatal or myeloproliferative syndrome specimens for Vitamin B12 levels. Performed at [facility], [place] [address], [place], [place] [address]   Folate, serum, performed at [facility]     Status: None   Collection Time: 05/31/21  4:10 AM  Result Value Ref Range   Folate 6.8 >5.9 ng/mL    Comment: Performed at [facility], [place] [address] [place], [place] [address]  Urine rapid drug screen (hosp performed)     Status: Abnormal   Collection Time: 05/31/21  8:00 AM  Result Value Ref Range   Opiates NONE DETECTED NONE DETECTED   Cocaine NONE DETECTED NONE DETECTED   Benzodiazepines NONE DETECTED NONE DETECTED   Amphetamines POSITIVE (A) NONE DETECTED   Tetrahydrocannabinol NONE DETECTED NONE DETECTED    Barbiturates NONE DETECTED NONE DETECTED    Comment: (NOTE) DRUG SCREEN FOR MEDICAL PURPOSES ONLY.  IF CONFIRMATION IS NEEDED FOR ANY PURPOSE, NOTIFY LAB WITHIN 5 DAYS.  LOWEST DETECTABLE LIMITS FOR URINE DRUG SCREEN Drug Class  Cutoff (ng/mL) Amphetamine and metabolites    1000 Barbiturate and metabolites    200 Benzodiazepine                 161 Tricyclics and metabolites     300 Opiates and metabolites        300 Cocaine and metabolites        300 THC                            50 Performed at [facility], [place] [address], [place], [place] [address]   MRSA Next Gen by PCR, Nasal     Status: Abnormal   Collection Time: 05/31/21 10:14 AM   Specimen: Nasal Mucosa; Nasal Swab  Result Value Ref Range   MRSA by PCR Next Gen DETECTED (A) NOT DETECTED    Comment: RESULT CALLED TO, READ BACK BY AND VERIFIED WITH: C. APPLEWHITE RN, AT 1211 05/31/21 D. VANHOOK (NOTE) The GeneXpert MRSA Assay (FDA approved for NASAL specimens only), is one component of a comprehensive MRSA colonization surveillance program. It is not intended to diagnose MRSA infection nor to guide or monitor treatment for MRSA infections. Test performance is not FDA approved in patients less than 19 years old. Performed at [facility], [place] [address], [place],  [address]    CT ABDOMEN PELVIS W CONTRAST  Result Date: 05/30/2021 CLINICAL DATA:  Wound on left buttock EXAM: CT ABDOMEN AND PELVIS WITH CONTRAST TECHNIQUE: Multidetector CT imaging of the abdomen and pelvis was performed using the standard protocol following bolus administration of intravenous contrast. CONTRAST:  1m OMNIPAQUE IOHEXOL 350 MG/ML SOLN COMPARISON:  None. FINDINGS: Lower chest: Lung bases are clear. Hepatobiliary: Liver is within normal limits. Gallbladder is unremarkable. No intrahepatic or extrahepatic ductal dilatation. Pancreas: Within normal limits. Spleen: Within normal limits. Adrenals/Urinary Tract:  Adrenal glands are within normal limits. Kidneys are within normal limits.  No hydronephrosis. Bladder is mildly thick-walled although underdistended. Stomach/Bowel: Stomach is within normal limits. No evidence of bowel obstruction. Normal appendix (series 2/image 67). No colonic wall thickening or inflammatory changes. Vascular/Lymphatic: No evidence of abdominal aortic aneurysm. No suspicious abdominopelvic lymphadenopathy. Reproductive: Prostate is unremarkable. Other: No abdominopelvic ascites. Musculoskeletal: Large left medial gluteal ulcer, measuring up to 5 cm in depth (series 2/image 93), directly approximating the left inferior pubic ramus (series 2/image 99), suggesting chronic osteomyelitis. No cortical destruction is evident on CT. Additional inflammatory changes involving the left gluteus maximus muscle, suggesting myositis (series 2/image 92). Lumbar spine is within normal limits. IMPRESSION: Large left medial gluteal ulcer, as described above. Ulcer directly approximates the left inferior pubic ramus, suggesting chronic osteomyelitis. No cortical destruction is evident on CT. Associated myositis involving the left gluteus maximus muscle. Electronically Signed   By: SJulian HyM.D.   On: 05/30/2021 04:04    drug use, chronic hepatitis C, history of urinary retention with self-catheterization, history of paranoid schizophrenia, PTSD and anxiety. He has a hx of prior UTI.  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  From our standpoint there is nothing to debride.  I would go with the wound care as recommended by Wound ostomy  nurse.  He  may need ID evaluation for the osteomyelitis.  Defer elbow to Orthopedics.   Please call if we can be of further assistance.    Earnstine Regal [facility] Surgery 05/31/2021, 3:42 PM Please see Amion for pager number during day hours 7:00am-4:30pm
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
## 2021-05-31 NOTE — Progress Notes (Signed)
PT Cancellation Note  Patient Details Name: DHIREN AZIMI MRN: [id number] DOB: Nov 25, 1990   Cancelled Treatment:    Reason Eval/Treat Not Completed: Medical issues which prohibited therapy. PT awaiting plan from orthopedics due to L hip osteomyelitis. PT will continue to follow.   Arlyss Gandy 05/31/2021, 10:43 AM

## 2021-06-01 DIAGNOSIS — M86652 Other chronic osteomyelitis, left thigh: Principal | ICD-10-CM

## 2021-06-01 DIAGNOSIS — F1910 Other psychoactive substance abuse, uncomplicated: Secondary | ICD-10-CM | POA: Diagnosis not present

## 2021-06-01 DIAGNOSIS — L89324 Pressure ulcer of left buttock, stage 4: Secondary | ICD-10-CM | POA: Diagnosis not present

## 2021-06-01 LAB — BLOOD CULTURE ID PANEL (REFLEXED) - BCID2

## 2021-06-01 LAB — CBC
HCT: 25 % — ABNORMAL LOW (ref 39.0–52.0)
Hemoglobin: 7.8 g/dL — ABNORMAL LOW (ref 13.0–17.0)
MCH: 27.1 pg (ref 26.0–34.0)
MCHC: 31.2 g/dL (ref 30.0–36.0)
MCV: 86.8 fL (ref 80.0–100.0)
Platelets: 303 10*3/uL (ref 150–400)
RBC: 2.88 MIL/uL — ABNORMAL LOW (ref 4.22–5.81)
RDW: 13 % (ref 11.5–15.5)
WBC: 4.4 10*3/uL (ref 4.0–10.5)
nRBC: 0 % (ref 0.0–0.2)

## 2021-06-01 LAB — HIV ANTIBODY (ROUTINE TESTING W REFLEX): HIV Screen 4th Generation wRfx: NONREACTIVE

## 2021-06-01 LAB — HEPATITIS B SURFACE ANTIGEN: Hepatitis B Surface Ag: NONREACTIVE

## 2021-06-01 LAB — RPR: RPR Ser Ql: NONREACTIVE

## 2021-06-01 LAB — HEPATITIS B CORE ANTIBODY, TOTAL: Hep B Core Total Ab: NONREACTIVE

## 2021-06-01 LAB — C-REACTIVE PROTEIN: CRP: 7.8 mg/dL — ABNORMAL HIGH (ref <1.0)

## 2021-06-01 LAB — SEDIMENTATION RATE: Sed Rate: 119 mm/hr — ABNORMAL HIGH (ref 0–16)

## 2021-06-01 MED ORDER — SODIUM CHLORIDE 0.9 % IV SOLN
2.0000 g | Freq: Three times a day (TID) | INTRAVENOUS | Status: DC
  Administered 2021-06-01 – 2021-06-03 (×8): 2 g via INTRAVENOUS
  Filled 2021-06-01 (×8): qty 2

## 2021-06-01 NOTE — Progress Notes (Signed)
Pharmacy Antibiotic Note  Steve [name] is a 30 y.o. male admitted on 05/30/2021 with  osteo . Pt started on Vanc/Unasyn. Lab called and pt with GNR (nothing on BCID panel) in 1/4 blood culture bottles. Noted pt also with GPC in wound culture. CT suggests chronic osteo.  Cefepime 9/15 >> Vanc 9/13 >> Zosyn 9/13 x1 Unasyn 9/14 >> 9/15  9/14: MRSA PCR: positive 9/13: L buttocks: GPC, GNR 9/13: BCx x 2>> 1/4 bottles GNR - no BCID organisms   Plan: Discussed with Dr. Kennon Rounds and will change Unasyn to Cefepime to cover non-BCID GNR. Cefepime 2gm IV Q8h Continue previous Vanc to 1500mg  IV Q12H (AUC 455, SCr 0.8)-no missed doses.  Continue to f/u micro data, pt's clinical condition, and renal function  PHARMACY - PHYSICIAN COMMUNICATION CRITICAL VALUE ALERT - BLOOD CULTURE IDENTIFICATION (BCID)   Results for orders placed or performed during the hospital encounter of 05/30/21  Blood Culture ID Panel (Reflexed) (Collected: 05/30/2021  2:35 AM)  Result Value Ref Range   Enterococcus faecalis NOT DETECTED NOT DETECTED   Enterococcus Faecium NOT DETECTED NOT DETECTED   Listeria monocytogenes NOT DETECTED NOT DETECTED   Staphylococcus species NOT DETECTED NOT DETECTED   Staphylococcus aureus (BCID) NOT DETECTED NOT DETECTED   Staphylococcus epidermidis NOT DETECTED NOT DETECTED   Staphylococcus lugdunensis NOT DETECTED NOT DETECTED   Streptococcus species NOT DETECTED NOT DETECTED   Streptococcus agalactiae NOT DETECTED NOT DETECTED   Streptococcus pneumoniae NOT DETECTED NOT DETECTED   Streptococcus pyogenes NOT DETECTED NOT DETECTED   A.calcoaceticus-baumannii NOT DETECTED NOT DETECTED   Bacteroides fragilis NOT DETECTED NOT DETECTED   Enterobacterales NOT DETECTED NOT DETECTED   Enterobacter cloacae complex NOT DETECTED NOT DETECTED   Escherichia coli NOT DETECTED NOT DETECTED   Klebsiella aerogenes NOT DETECTED NOT DETECTED   Klebsiella oxytoca NOT DETECTED NOT DETECTED    Klebsiella pneumoniae NOT DETECTED NOT DETECTED   Proteus species NOT DETECTED NOT DETECTED   Salmonella species NOT DETECTED NOT DETECTED   Serratia marcescens NOT DETECTED NOT DETECTED   Haemophilus influenzae NOT DETECTED NOT DETECTED   Neisseria meningitidis NOT DETECTED NOT DETECTED   Pseudomonas aeruginosa NOT DETECTED NOT DETECTED   Stenotrophomonas maltophilia NOT DETECTED NOT DETECTED   Candida albicans NOT DETECTED NOT DETECTED   Candida auris NOT DETECTED NOT DETECTED   Candida glabrata NOT DETECTED NOT DETECTED   Candida krusei NOT DETECTED NOT DETECTED   Candida parapsilosis NOT DETECTED NOT DETECTED   Candida tropicalis NOT DETECTED NOT DETECTED   Cryptococcus neoformans/gattii NOT DETECTED NOT DETECTED    Height: 6' (182.9 cm) Weight: 75 kg (165 lb 5.5 oz) IBW/kg (Calculated) : 77.6  Temp (24hrs), Avg:99 F (37.2 C), Min:98 F (36.7 C), Max:100.5 F (38.1 C)  Recent Labs  Lab 05/30/21 0231 05/31/21 0410 06/01/21 0028  WBC 6.5 6.3 4.4  CREATININE 0.63  --   --   LATICACIDVEN 1.2  --   --      Estimated Creatinine Clearance: 141.9 mL/min (by C-G formula based on SCr of 0.63 mg/dL).    Allergies  Allergen Reactions   Caffeine Anaphylaxis   Caffeine Anaphylaxis   Chocolate Anaphylaxis   Chocolate Anaphylaxis   Sulfa Antibiotics Rash   Sulfa Antibiotics Rash    06/03/21, PharmD, BCPS Please see amion for complete clinical pharmacist phone list 06/01/2021 2:08 AM

## 2021-06-01 NOTE — Progress Notes (Signed)
Pharmacy Antibiotic Note  Steve [name] is a 30 y.o. male admitted on 05/30/2021 with  osteo . Pt started on Vanc/Unasyn. Lab called and pt with GNR (nothing on BCID panel) in 1/4 blood culture bottles.  CT suggests chronic osteo  Cefepime 9/15 >> Vanc 9/13 >> Zosyn 9/13 x1 Unasyn 9/14 >> 9/15  9/14: MRSA PCR: neg 9/13: L buttocks: GPC, GNR 9/13: BCx x 2>> 1/4 bottles GNR - no BCID organisms   Plan: Discussed with Dr. Kennon Rounds and will change Unasyn to Cefepime to cover non-BCID GNR. Cefepime 2gm IV Q8h Continue previous Vanc to 1500mg  IV Q12H (AUC 455, SCr 0.8)-no missed doses. Dayshift team to determine whether to d/c Vancomycin. Continue to f/u micro data, pt's clinical condition, and renal function  PHARMACY - PHYSICIAN COMMUNICATION CRITICAL VALUE ALERT - BLOOD CULTURE IDENTIFICATION (BCID)   Results for orders placed or performed during the hospital encounter of 05/30/21  Blood Culture ID Panel (Reflexed) (Collected: 05/30/2021  2:35 AM)  Result Value Ref Range   Enterococcus faecalis NOT DETECTED NOT DETECTED   Enterococcus Faecium NOT DETECTED NOT DETECTED   Listeria monocytogenes NOT DETECTED NOT DETECTED   Staphylococcus species NOT DETECTED NOT DETECTED   Staphylococcus aureus (BCID) NOT DETECTED NOT DETECTED   Staphylococcus epidermidis NOT DETECTED NOT DETECTED   Staphylococcus lugdunensis NOT DETECTED NOT DETECTED   Streptococcus species NOT DETECTED NOT DETECTED   Streptococcus agalactiae NOT DETECTED NOT DETECTED   Streptococcus pneumoniae NOT DETECTED NOT DETECTED   Streptococcus pyogenes NOT DETECTED NOT DETECTED   A.calcoaceticus-baumannii NOT DETECTED NOT DETECTED   Bacteroides fragilis NOT DETECTED NOT DETECTED   Enterobacterales NOT DETECTED NOT DETECTED   Enterobacter cloacae complex NOT DETECTED NOT DETECTED   Escherichia coli NOT DETECTED NOT DETECTED   Klebsiella aerogenes NOT DETECTED NOT DETECTED   Klebsiella oxytoca NOT DETECTED NOT  DETECTED   Klebsiella pneumoniae NOT DETECTED NOT DETECTED   Proteus species NOT DETECTED NOT DETECTED   Salmonella species NOT DETECTED NOT DETECTED   Serratia marcescens NOT DETECTED NOT DETECTED   Haemophilus influenzae NOT DETECTED NOT DETECTED   Neisseria meningitidis NOT DETECTED NOT DETECTED   Pseudomonas aeruginosa NOT DETECTED NOT DETECTED   Stenotrophomonas maltophilia NOT DETECTED NOT DETECTED   Candida albicans NOT DETECTED NOT DETECTED   Candida auris NOT DETECTED NOT DETECTED   Candida glabrata NOT DETECTED NOT DETECTED   Candida krusei NOT DETECTED NOT DETECTED   Candida parapsilosis NOT DETECTED NOT DETECTED   Candida tropicalis NOT DETECTED NOT DETECTED   Cryptococcus neoformans/gattii NOT DETECTED NOT DETECTED    Height: 6' (182.9 cm) Weight: 75 kg (165 lb 5.5 oz) IBW/kg (Calculated) : 77.6  Temp (24hrs), Avg:99 F (37.2 C), Min:98 F (36.7 C), Max:100.5 F (38.1 C)  Recent Labs  Lab 05/30/21 0231 05/31/21 0410 06/01/21 0028  WBC 6.5 6.3 4.4  CREATININE 0.63  --   --   LATICACIDVEN 1.2  --   --      Estimated Creatinine Clearance: 141.9 mL/min (by C-G formula based on SCr of 0.63 mg/dL).    Allergies  Allergen Reactions   Caffeine Anaphylaxis   Caffeine Anaphylaxis   Chocolate Anaphylaxis   Chocolate Anaphylaxis   Sulfa Antibiotics Rash   Sulfa Antibiotics Rash    06/03/21, PharmD, BCPS Please see amion for complete clinical pharmacist phone list 06/01/2021 1:19 AM

## 2021-06-01 NOTE — Plan of Care (Signed)
°  Problem: Education: °Goal: Knowledge of General Education information will improve °Description: Including pain rating scale, medication(s)/side effects and non-pharmacologic comfort measures °Outcome: Progressing °  °Problem: Nutrition: °Goal: Adequate nutrition will be maintained °Outcome: Progressing °  °Problem: Skin Integrity: °Goal: Risk for impaired skin integrity will decrease °Outcome: Progressing °  °
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
## 2021-06-01 NOTE — Progress Notes (Signed)
PT Cancellation Note  Patient Details Name: Steve [name] MRN: [id number] DOB: Nov 24, 1990   Cancelled Treatment:    Reason Eval/Treat Not Completed: Patient declined, no reason specified. PT will follow up again tomorrow. If pt refuses for third consecutive date then PT will sign off.   Arlyss Gandy 06/01/2021, 4:49 PM
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
## 2021-06-01 NOTE — Progress Notes (Signed)
[facility]  Date of Admission:  05/30/2021   Total days of antibiotics 2        Day 1 Cefepime        Day 2 vancomycin         ASSESSMENT: 30 yo male paraplegia, substance abuse, hx hep c ab positive, self cath for chronic urinary retention admitted with sepsis and ct imaging abd/pelv possible osteomyelitis  Found to have GNR on BCID- pending specific organism  UDS pos- Amphetamines HIV antibody- non-reactive Hep B core ab and surface antigen- non-reactive RPR- non-reactive CRP- 7.8 Sed rate- 119   PLAN: No biopsy avail at this time- plan to treat with empiric oral doxy/augmentin- 2 week duration Hep b surface antibody-pending HIV RNA quant- pending ID clinic set for 10/24 for re-evaluation Will require regular wound care visits following DC  Active Problems:   Substance abuse (HCC)   Chronic osteomyelitis, pelvis, left (HCC)   Osteomyelitis (HCC)   Pressure injury of skin   Sepsis (HCC)   Scheduled Meds:  docusate sodium  100 mg Oral BID   enoxaparin (LOVENOX) injection  40 mg Subcutaneous Q24H   ferrous sulfate  325 mg Oral BID WC   Continuous Infusions:  ceFEPime (MAXIPIME) IV 2 g (06/01/21 0253)   vancomycin Stopped (06/01/21 0601)   PRN Meds:.acetaminophen, cyclobenzaprine, ketorolac   SUBJECTIVE: I seen and evaluated Mr. Steve [name] at bedside. He was accompanied by his wife. Mr. Holness states that he feels tired. Did not report any other complaint.     Allergies  Allergen Reactions   Caffeine Anaphylaxis   Caffeine Anaphylaxis   Chocolate Anaphylaxis   Chocolate Anaphylaxis   Sulfa Antibiotics Rash   Sulfa Antibiotics Rash    OBJECTIVE: Vitals:   05/31/21 1821 05/31/21 2019 06/01/21 0607 06/01/21 1027  BP: 128/67  (!) 110/97 (!) 159/74  Pulse: 85  79 81  Resp: 16  18 16   Temp: 98 F (36.7 C) 98.9 F (37.2 C) (!) 97.4 F (36.3 C) 98.6 F (37 C)  TempSrc: Oral Oral Oral Oral  SpO2: 99%  99% 98%  Weight:       Height:       Body mass index is 22.42 kg/m.  Physical Exam Constitutional:      Appearance: He is diaphoretic. He is not ill-appearing or toxic-appearing.     Comments: Patient was lying on his right side, more awake than previous day.  HENT:     Head: Normocephalic and atraumatic.  Musculoskeletal:     Comments: Stage 3 Pressure ulcer noted on left medial malleolus, covered in bandage. Serosanguinous fluid noted on bandage.  Pressure padding noted on bilateral heels.  Skin:    General: Skin is warm.     Comments: 10x10cm wound present of left gluteal region. Red erythematous border, no pus or drainage noted Small area of eschar in the center of wound    Lab Results Lab Results  Component Value Date   WBC 4.4 06/01/2021   HGB 7.8 (L) 06/01/2021   HCT 25.0 (L) 06/01/2021   MCV 86.8 06/01/2021   PLT 303 06/01/2021    Lab Results  Component Value Date   CREATININE 0.63 05/30/2021   BUN 14 05/30/2021   NA 134 (L) 05/30/2021   K 3.9 05/30/2021   CL 102 05/30/2021   CO2 24 05/30/2021    Lab Results  Component Value Date   ALT 9 05/30/2021   AST  12 (L) 05/30/2021   ALKPHOS 55 05/30/2021   BILITOT 0.2 (L) 05/30/2021     Microbiology: Recent Results (from the past 240 hour(s))  Culture, blood (Routine X 2) w Reflex to ID Panel     Status: None (Preliminary result)   Collection Time: 05/30/21  2:30 AM   Specimen: BLOOD  Result Value Ref Range Status   Specimen Description   Final    BLOOD BOTTLES DRAWN AEROBIC AND ANAEROBIC Performed at [facility], [address] [place], [place] [address]    Special Requests   Final    Blood Culture adequate volume BLOOD RIGHT ARM Performed at [facility], [address], [place], [place] [address]    Culture   Final    NO GROWTH 2 DAYS Performed at [facility], [address], [place], [place] [address]    Report Status PENDING  Incomplete  Aerobic Culture w Gram  Stain (superficial specimen)     Status: None (Preliminary result)   Collection Time: 05/30/21  2:31 AM   Specimen: Buttocks  Result Value Ref Range Status   Specimen Description   Final    BUTTOCKS LEFT Performed at [facility], [address], [place], [place] [address]    Special Requests   Final    NONE Performed at [facility], [address], [place], [place] [address]    Gram Stain   Final    FEW SQUAMOUS EPITHELIAL CELLS PRESENT FEW WBC SEEN MODERATE GRAM NEGATIVE RODS FEW GRAM POSITIVE COCCI    Culture   Final    ABUNDANT GRAM NEGATIVE RODS IDENTIFICATION AND SUSCEPTIBILITIES TO FOLLOW CULTURE REINCUBATED FOR BETTER GROWTH Performed at [facility], [address], [place], [place] [address]    Report Status PENDING  Incomplete  Culture, blood (Routine X 2) w Reflex to ID Panel     Status: None (Preliminary result)   Collection Time: 05/30/21  2:35 AM   Specimen: BLOOD  Result Value Ref Range Status   Specimen Description   Final    BLOOD BOTTLES DRAWN AEROBIC AND ANAEROBIC Performed at [facility], [address], [place], [place] [address]    Special Requests   Final    Blood Culture adequate volume LEFT ANTECUBITAL Performed at [facility], [address], [place], [place] [address]    Culture  Setup Time   Final    GRAM NEGATIVE RODS AEROBIC BOTTLE ONLY CRITICAL RESULT CALLED TO, READ BACK BY AND VERIFIED WITH: PHARMD C. AMEND 06/01/2021 @0106  BY JW    Culture   Final    CULTURE REINCUBATED FOR BETTER GROWTH Performed at [facility], [address] [place], [place]    Report Status PENDING  Incomplete  Blood Culture ID Panel (Reflexed)     Status: None   Collection Time: 05/30/21  2:35 AM  Result Value Ref Range Status   Enterococcus faecalis NOT DETECTED NOT DETECTED Final   Enterococcus Faecium NOT DETECTED NOT DETECTED Final   Listeria  monocytogenes NOT DETECTED NOT DETECTED Final   Staphylococcus species NOT DETECTED NOT DETECTED Final   Staphylococcus aureus (BCID) NOT DETECTED NOT DETECTED Final   Staphylococcus epidermidis NOT DETECTED NOT DETECTED Final   Staphylococcus lugdunensis NOT DETECTED NOT DETECTED Final   Streptococcus species NOT DETECTED NOT DETECTED Final   Streptococcus agalactiae NOT DETECTED NOT DETECTED Final   Streptococcus pneumoniae NOT DETECTED NOT DETECTED Final   Streptococcus pyogenes NOT  DETECTED NOT DETECTED Final   A.calcoaceticus-baumannii NOT DETECTED NOT DETECTED Final   Bacteroides fragilis NOT DETECTED NOT DETECTED Final   Enterobacterales NOT DETECTED NOT DETECTED Final   Enterobacter cloacae complex NOT DETECTED NOT DETECTED Final   Escherichia coli NOT DETECTED NOT DETECTED Final   Klebsiella aerogenes NOT DETECTED NOT DETECTED Final   Klebsiella oxytoca NOT DETECTED NOT DETECTED Final   Klebsiella pneumoniae NOT DETECTED NOT DETECTED Final   Proteus species NOT DETECTED NOT DETECTED Final   Salmonella species NOT DETECTED NOT DETECTED Final   Serratia marcescens NOT DETECTED NOT DETECTED Final   Haemophilus influenzae NOT DETECTED NOT DETECTED Final   Neisseria meningitidis NOT DETECTED NOT DETECTED Final   Pseudomonas aeruginosa NOT DETECTED NOT DETECTED Final   Stenotrophomonas maltophilia NOT DETECTED NOT DETECTED Final   Candida albicans NOT DETECTED NOT DETECTED Final   Candida auris NOT DETECTED NOT DETECTED Final   Candida glabrata NOT DETECTED NOT DETECTED Final   Candida krusei NOT DETECTED NOT DETECTED Final   Candida parapsilosis NOT DETECTED NOT DETECTED Final   Candida tropicalis NOT DETECTED NOT DETECTED Final   Cryptococcus neoformans/gattii NOT DETECTED NOT DETECTED Final    Comment: Performed at [facility], [address], [place], [place] [address]  Resp Panel by RT-PCR (Flu A&B, Covid) Nasopharyngeal Swab     Status: None   Collection Time:  05/30/21  4:21 AM   Specimen: Nasopharyngeal Swab; Nasopharyngeal(NP) swabs in vial transport medium  Result Value Ref Range Status   SARS Coronavirus 2 by RT PCR NEGATIVE NEGATIVE Final    Comment: (NOTE) SARS-CoV-2 target nucleic acids are NOT DETECTED.  The SARS-CoV-2 RNA is generally detectable in upper respiratory specimens during the acute phase of infection. The lowest concentration of SARS-CoV-2 viral copies this assay can detect is 138 copies/mL. A negative result does not preclude SARS-Cov-2 infection and should not be used as the sole basis for treatment or other patient management decisions. A negative result may occur with  improper specimen collection/handling, submission of specimen other than nasopharyngeal swab, presence of viral mutation(s) within the areas targeted by this assay, and inadequate number of viral copies(<138 copies/mL). A negative result must be combined with clinical observations, patient history, and epidemiological information. The expected result is Negative.  Fact Sheet for Patients:  BloggerCourse.com  Fact Sheet for Healthcare Providers:  SeriousBroker.it  This test is no t yet approved or cleared by the [place] FDA and  has been authorized for detection and/or diagnosis of SARS-CoV-2 by FDA under an Emergency Use Authorization (EUA). This EUA will remain  in effect (meaning this test can be used) for the duration of the COVID-19 declaration under Section 564(b)(1) of the Act, 21 U.S.C.section 360bbb-3(b)(1), unless the authorization is terminated  or revoked sooner.       Influenza A by PCR NEGATIVE NEGATIVE Final   Influenza B by PCR NEGATIVE NEGATIVE Final    Comment: (NOTE) The Xpert Xpress SARS-CoV-2/FLU/RSV plus assay is intended as an aid in the diagnosis of influenza from Nasopharyngeal swab specimens and should not be used as a sole basis for treatment. Nasal washings  and aspirates are unacceptable for Xpert Xpress SARS-CoV-2/FLU/RSV testing.  Fact Sheet for Patients: BloggerCourse.com  Fact Sheet for Healthcare Providers: SeriousBroker.it  This test is not yet approved or cleared by the [place] FDA and has been authorized for detection and/or diagnosis of SARS-CoV-2 by FDA under an Emergency Use Authorization (EUA). This EUA will remain in effect (  meaning this test can be used) for the duration of the COVID-19 declaration under Section 564(b)(1) of the Act, 21 U.S.C. section 360bbb-3(b)(1), unless the authorization is terminated or revoked.  Performed at [facility], [address] [place], [place] [address]   MRSA Next Gen by PCR, Nasal     Status: Abnormal   Collection Time: 05/31/21 10:14 AM   Specimen: Nasal Mucosa; Nasal Swab  Result Value Ref Range Status   MRSA by PCR Next Gen DETECTED (A) NOT DETECTED Final    Comment: RESULT CALLED TO, READ BACK BY AND VERIFIED WITH: C. APPLEWHITE RN, AT 1211 05/31/21 D. VANHOOK (NOTE) The GeneXpert MRSA Assay (FDA approved for NASAL specimens only), is one component of a comprehensive MRSA colonization surveillance program. It is not intended to diagnose MRSA infection nor to guide or monitor treatment for MRSA infections. Test performance is not FDA approved in patients less than 56 years old. Performed at [facility], [address], [place], [place] [address]    CT ABDOMEN AND PELVIS WITH CONTRAST   TECHNIQUE: Multidetector CT imaging of the abdomen and pelvis was performed using the standard protocol following bolus administration of intravenous contrast.   CONTRAST:  68mL OMNIPAQUE IOHEXOL 350 MG/ML SOLN   COMPARISON:  None.   FINDINGS: Lower chest: Lung bases are clear.   Hepatobiliary: Liver is within normal limits.   Gallbladder is unremarkable. No intrahepatic or extrahepatic  ductal dilatation.   Pancreas: Within normal limits.   Spleen: Within normal limits.   Adrenals/Urinary Tract: Adrenal glands are within normal limits.   Kidneys are within normal limits.  No hydronephrosis.   Bladder is mildly thick-walled although underdistended.   Stomach/Bowel: Stomach is within normal limits.   No evidence of bowel obstruction.   Normal appendix (series 2/image 67).   No colonic wall thickening or inflammatory changes.   Vascular/Lymphatic: No evidence of abdominal aortic aneurysm.   No suspicious abdominopelvic lymphadenopathy.   Reproductive: Prostate is unremarkable.   Other: No abdominopelvic ascites.   Musculoskeletal: Large left medial gluteal ulcer, measuring up to 5 cm in depth (series 2/image 93), directly approximating the left inferior pubic ramus (series 2/image 99), suggesting chronic osteomyelitis. No cortical destruction is evident on CT.   Additional inflammatory changes involving the left gluteus maximus muscle, suggesting myositis (series 2/image 92).   Lumbar spine is within normal limits.   IMPRESSION: Large left medial gluteal ulcer, as described above. Ulcer directly approximates the left inferior pubic ramus, suggesting chronic osteomyelitis. No cortical destruction is evident on CT. Associated myositis involving the left gluteus maximus muscle.   Dellis Filbert, MD PGY-1 Internal Medicine Resident [facility] [phone] pager    06/01/2021, 2:13 PM

## 2021-06-02 DIAGNOSIS — M86652 Other chronic osteomyelitis, left thigh: Secondary | ICD-10-CM | POA: Diagnosis not present

## 2021-06-02 DIAGNOSIS — F1910 Other psychoactive substance abuse, uncomplicated: Secondary | ICD-10-CM | POA: Diagnosis not present

## 2021-06-02 LAB — AEROBIC CULTURE W GRAM STAIN (SUPERFICIAL SPECIMEN)

## 2021-06-02 LAB — CREATININE, SERUM
Creatinine, Ser: 0.71 mg/dL (ref 0.61–1.24)
GFR, Estimated: >60 mL/min (ref >60)

## 2021-06-02 LAB — HCV RNA QUANT: HCV Quantitative: NOT DETECTED IU/mL (ref >50)

## 2021-06-02 LAB — HEPATITIS B SURFACE ANTIBODY, QUANTITATIVE: Hep B S AB Quant (Post): >1000 m[IU]/mL (ref >9.9)

## 2021-06-02 MED ORDER — CHLORHEXIDINE GLUCONATE CLOTH 2 % EX PADS
6.0000 | MEDICATED_PAD | Freq: Every day | CUTANEOUS | Status: DC
  Administered 2021-06-03: 6 via TOPICAL

## 2021-06-02 MED ORDER — DOXYCYCLINE HYCLATE 100 MG PO TABS
100.0000 mg | ORAL_TABLET | Freq: Two times a day (BID) | ORAL | Status: DC
  Administered 2021-06-02 – 2021-06-03 (×2): 100 mg via ORAL
  Filled 2021-06-02 (×2): qty 1

## 2021-06-02 MED ORDER — METHOCARBAMOL 500 MG PO TABS
500.0000 mg | ORAL_TABLET | Freq: Once | ORAL | Status: AC
  Administered 2021-06-02: 500 mg via ORAL
  Filled 2021-06-02: qty 1

## 2021-06-02 MED ORDER — MUPIROCIN 2 % EX OINT: 1.0000 "application " | TOPICAL_OINTMENT | Freq: Two times a day (BID) | CUTANEOUS | Status: DC

## 2021-06-02 NOTE — TOC Progression Note (Signed)
Transition of Care [facility]) - Progression Note    Patient Details  Name: Steve [name] MRN: [id number] Date of Birth: [birth year]  [facility] CM/SW Contact  Tom-Johnson, Hershal Coria, RN Phone Number: 06/02/2021, 3:13 PM  Clinical Narrative:    CM faxed copies of the H&H RN, wound care notes, home health order, hospital bed and air loss mattress to both [phone] and [phone] numbers that were given by Fredderick Phenix, Primary care Social worker. Audrea Muscat [phone]), assisting Cicero Duck called CM requesting to fill out a form she will e-mail for the DME hospital bed and mattress and fax it back to them to the number on the form. Form received via e-mail, filled and faxed back to number stated. Jerrel Ivory stated it will take a few weeks for patient to receive bed but will start request. Patient and wife notified as well as MD. Weekend CM should fax copy of the discharge summary if patient is discharged on the weekend to [phone], attention  Dr. York Cerise. CM will continue with TOC needs.    Expected Discharge Plan: Home w Home Health Services Barriers to Discharge: Continued Medical Work up  Expected Discharge Plan and Services Expected Discharge Plan: Home w Home Health Services   Discharge Planning Services: CM Consult Post Acute Care Choice: Home Health Living arrangements for the past 2 months: Single Family Home Expected Discharge Date: 06/02/21                   Date DME Agency Contacted: 06/01/21 Time DME Agency Contacted: 1043 Representative spoke with at DME Agency: Renea Ee             Social Determinants of Health (SDOH) Interventions    Readmission Risk Interventions No flowsheet data found.

## 2021-06-02 NOTE — Progress Notes (Signed)
PT Cancellation Note  Patient Details Name: Steve [name] MRN: [id number] DOB: Feb 04, 1991   Cancelled Treatment:    Reason Eval/Treat Not Completed: Patient declined, no reason specified. Pt declines PT evaluation, reporting concerns over moving with L hip wound. PT provides education on pressure relief techniques to utilize in bed and when in wheelchair, including a turn schedule during the day to reduce risk of pressure injury. Pt refuses mobility at this time. PT will sign off due to 3rd day of consecutive refusal. Please re-consult PT if any mobility concerns arise and if the patient becomes agreeable to participating in PT services.   Arlyss Gandy 06/02/2021, 3:27 PM
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
## 2021-06-02 NOTE — Progress Notes (Signed)
Id briefnote   Bcx still in progress   Would keep inpatient on current abx until bcx returns, as potential modification of current abx recs possible Discussed with dr Maryfrances Bunnell

## 2021-06-02 NOTE — TOC Progression Note (Addendum)
Transition of Care [facility]) - Progression Note    Patient Details  Name: Steve [name] MRN: [id number] Date of Birth: 02-22-1991  [facility] CM/SW Contact  Tom-Johnson, Hershal Coria, RN Phone Number: 06/02/2021, 10:48 AM  Clinical Narrative:    CM got a call from April, RN transfer Coordinator with [place] [phone]) and she states she does not schedule home health and transferred CM to [facility] [phone] ext [phone]) patient's Primary Care Social Worker. Erika requests copy of the H&P, Discharge summary, Recent wound notes, orders for home health RN, hospital bed and air loss mattress. MD notified for request. CM will fax information as soon as they are available. Will continue to follow with needs.   Expected Discharge Plan: Home w Home Health Services Barriers to Discharge: Continued Medical Work up  Expected Discharge Plan and Services Expected Discharge Plan: Home w Home Health Services   Discharge Planning Services: CM Consult Post Acute Care Choice: Home Health Living arrangements for the past 2 months: Single Family Home Expected Discharge Date: 06/02/21                   Date DME Agency Contacted: 06/01/21 Time DME Agency Contacted: 1043 Representative spoke with at DME Agency: Renea Ee             Social Determinants of Health (SDOH) Interventions    Readmission Risk Interventions No flowsheet data found.

## 2021-06-03 DIAGNOSIS — M86652 Other chronic osteomyelitis, left thigh: Secondary | ICD-10-CM | POA: Diagnosis not present

## 2021-06-03 LAB — CULTURE, BLOOD (ROUTINE X 2): Special Requests: ADEQUATE

## 2021-06-03 LAB — CBC
HCT: 27.3 % — ABNORMAL LOW (ref 39.0–52.0)
Hemoglobin: 8.6 g/dL — ABNORMAL LOW (ref 13.0–17.0)
MCH: 27.1 pg (ref 26.0–34.0)
MCHC: 31.5 g/dL (ref 30.0–36.0)
MCV: 86.1 fL (ref 80.0–100.0)
Platelets: 363 10*3/uL (ref 150–400)
RBC: 3.17 MIL/uL — ABNORMAL LOW (ref 4.22–5.81)
RDW: 12.8 % (ref 11.5–15.5)
WBC: 4 10*3/uL (ref 4.0–10.5)
nRBC: 0 % (ref 0.0–0.2)

## 2021-06-03 LAB — BASIC METABOLIC PANEL
Anion gap: 6 (ref 5–15)
BUN: 8 mg/dL (ref 6–20)
CO2: 27 mmol/L (ref 22–32)
Calcium: 8.3 mg/dL — ABNORMAL LOW (ref 8.9–10.3)
Chloride: 105 mmol/L (ref 98–111)
Creatinine, Ser: 0.75 mg/dL (ref 0.61–1.24)
GFR, Estimated: >60 mL/min (ref >60)
Glucose, Bld: 106 mg/dL — ABNORMAL HIGH (ref 70–99)
Potassium: 4.1 mmol/L (ref 3.5–5.1)
Sodium: 138 mmol/L (ref 135–145)

## 2021-06-03 MED ORDER — FERROUS SULFATE 325 (65 FE) MG PO TABS: 325.0000 mg | ORAL_TABLET | Freq: Two times a day (BID) | ORAL | 0 refills | Status: DC

## 2021-06-03 MED ORDER — CIPROFLOXACIN HCL 500 MG PO TABS: 500.0000 mg | ORAL_TABLET | Freq: Two times a day (BID) | ORAL | Status: DC

## 2021-06-03 MED ORDER — SACCHAROMYCES BOULARDII 250 MG PO CAPS: 250.0000 mg | ORAL_CAPSULE | Freq: Two times a day (BID) | ORAL | 0 refills | Status: AC

## 2021-06-03 MED ORDER — CIPROFLOXACIN HCL 500 MG PO TABS: 500.0000 mg | ORAL_TABLET | Freq: Two times a day (BID) | ORAL | 0 refills | Status: AC

## 2021-06-03 MED ORDER — ALUM & MAG HYDROXIDE-SIMETH 200-200-20 MG/5ML PO SUSP
30.0000 mL | ORAL | Status: DC | PRN
  Administered 2021-06-03: 30 mL via ORAL
  Filled 2021-06-03: qty 30

## 2021-06-03 MED ORDER — MUPIROCIN 2 % EX OINT: 1.0000 "application " | TOPICAL_OINTMENT | Freq: Two times a day (BID) | CUTANEOUS | 0 refills | Status: DC

## 2021-06-03 MED ORDER — DOCUSATE SODIUM 100 MG PO CAPS: 100.0000 mg | ORAL_CAPSULE | Freq: Two times a day (BID) | ORAL | 0 refills | Status: DC

## 2021-06-03 MED ORDER — METRONIDAZOLE 500 MG PO TABS: 500.0000 mg | ORAL_TABLET | Freq: Three times a day (TID) | ORAL | Status: DC

## 2021-06-03 MED ORDER — DOXYCYCLINE HYCLATE 100 MG PO TABS: 100.0000 mg | ORAL_TABLET | Freq: Two times a day (BID) | ORAL | 0 refills | Status: AC

## 2021-06-03 MED ORDER — METRONIDAZOLE 500 MG PO TABS: 500.0000 mg | ORAL_TABLET | Freq: Three times a day (TID) | ORAL | 0 refills | Status: AC

## 2021-06-03 NOTE — TOC Transition Note (Signed)
Transition of Care [facility]) - CM/SW Discharge Note   Patient Details  Name: NEHAL SHIVES MRN: [id number] Date of Birth: July 11, 1991  Transition of Care [facility]) CM/SW Contact:  Bess Kinds, RN Phone Number: [phone] 06/03/2021, 5:00 PM   Clinical Narrative:     Patient transitioned home today. DC summary faxed to Dr. York Cerise at [phone].   Final next level of care: Home w Home Health Services Barriers to Discharge: No Barriers Identified   Patient Goals and CMS Choice Patient states their goals for this hospitalization and ongoing recovery are:: To go home CMS Medicare.gov Compare Post Acute Care list provided to:: Patient Choice offered to / list presented to : Patient, Spouse  Discharge Placement                       Discharge Plan and Services   Discharge Planning Services: CM Consult Post Acute Care Choice: Home Health              Date DME Agency Contacted: 06/01/21 Time DME Agency Contacted: 1043 Representative spoke with at DME Agency: Renea Ee            Social Determinants of Health (SDOH) Interventions     Readmission Risk Interventions No flowsheet data found.

## 2021-06-03 NOTE — Discharge Summary (Signed)
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    BUTTOCKS LEFT Performed at [facility], [address], [place], [place] [address]    Special Requests   Final    NONE Performed at [facility], [address] [place], [place] [address]     Gram Stain   Final    FEW SQUAMOUS EPITHELIAL CELLS PRESENT FEW WBC SEEN MODERATE GRAM NEGATIVE RODS FEW GRAM POSITIVE COCCI Performed at [facility], [address] [place], [place] [address]    Culture ABUNDANT ESCHERICHIA COLI  Final   Report Status 06/02/2021 FINAL  Final   Organism ID, Bacteria ESCHERICHIA COLI  Final      Susceptibility   Escherichia coli - MIC*    AMPICILLIN >=32 RESISTANT Resistant     CEFAZOLIN <=4 SENSITIVE Sensitive     CEFEPIME <=0.12 SENSITIVE Sensitive     CEFTAZIDIME <=1 SENSITIVE Sensitive     CEFTRIAXONE <=0.25 SENSITIVE Sensitive     CIPROFLOXACIN >=4 RESISTANT Resistant     GENTAMICIN <=1 SENSITIVE Sensitive     IMIPENEM <=0.25 SENSITIVE Sensitive     TRIMETH/SULFA >=320 RESISTANT Resistant     AMPICILLIN/SULBACTAM 16 INTERMEDIATE Intermediate     PIP/TAZO <=4 SENSITIVE Sensitive     * ABUNDANT ESCHERICHIA COLI  Culture, blood (Routine X 2) w Reflex to ID Panel     Status: Abnormal   Collection Time: 05/30/21  2:35 AM   Specimen: BLOOD  Result Value Ref Range Status   Specimen Description   Final    BLOOD BOTTLES DRAWN AEROBIC AND ANAEROBIC Performed at [facility], [address] [place], [place] [address]    Special Requests   Final    Blood Culture adequate volume LEFT ANTECUBITAL Performed at [facility], [address] [place], [place] [address]    Culture  Setup Time   Final    GRAM NEGATIVE RODS AEROBIC BOTTLE ONLY CRITICAL RESULT CALLED TO, READ BACK BY AND VERIFIED WITH: PHARMD C. AMEND 06/01/2021 @0106  BY JW Performed at [facility], [address], [place], [place]    Culture PSEUDOMONAS PUTIDA (A)  Final   Report Status 06/03/2021 FINAL  Final   Organism ID, Bacteria PSEUDOMONAS PUTIDA  Final      Susceptibility   Pseudomonas putida - MIC*    CEFTAZIDIME 4 SENSITIVE Sensitive     CIPROFLOXACIN <=0.25 SENSITIVE Sensitive     GENTAMICIN <=1 SENSITIVE  Sensitive  IMIPENEM 2 SENSITIVE Sensitive     PIP/TAZO 8 SENSITIVE Sensitive     * PSEUDOMONAS PUTIDA  Blood Culture ID Panel (Reflexed)     Status: None   Collection Time: 05/30/21  2:35 AM  Result Value Ref Range Status   Enterococcus faecalis NOT DETECTED NOT DETECTED Final   Enterococcus Faecium NOT DETECTED NOT DETECTED Final   Listeria monocytogenes NOT DETECTED NOT DETECTED Final   Staphylococcus species NOT DETECTED NOT DETECTED Final   Staphylococcus aureus (BCID) NOT DETECTED NOT DETECTED Final   Staphylococcus epidermidis NOT DETECTED NOT DETECTED Final   Staphylococcus lugdunensis NOT DETECTED NOT DETECTED Final   Streptococcus species NOT DETECTED NOT DETECTED Final   Streptococcus agalactiae NOT DETECTED NOT DETECTED Final   Streptococcus pneumoniae NOT DETECTED NOT DETECTED Final   Streptococcus pyogenes NOT DETECTED NOT DETECTED Final   A.calcoaceticus-baumannii NOT DETECTED NOT DETECTED Final   Bacteroides fragilis NOT DETECTED NOT DETECTED Final   Enterobacterales NOT DETECTED NOT DETECTED Final   Enterobacter cloacae complex NOT DETECTED NOT DETECTED Final   Escherichia coli NOT DETECTED NOT DETECTED Final   Klebsiella aerogenes NOT DETECTED NOT DETECTED Final   Klebsiella oxytoca NOT DETECTED NOT DETECTED Final   Klebsiella pneumoniae NOT DETECTED NOT DETECTED Final   Proteus species NOT DETECTED NOT DETECTED Final   Salmonella species NOT DETECTED NOT DETECTED Final   Serratia marcescens NOT DETECTED NOT DETECTED Final   Haemophilus influenzae NOT DETECTED NOT DETECTED Final   Neisseria meningitidis NOT DETECTED NOT DETECTED Final   Pseudomonas aeruginosa NOT DETECTED NOT DETECTED Final   Stenotrophomonas maltophilia NOT DETECTED NOT DETECTED Final   Candida albicans NOT DETECTED NOT DETECTED Final   Candida auris NOT DETECTED NOT DETECTED Final   Candida glabrata NOT DETECTED NOT DETECTED Final   Candida krusei NOT DETECTED NOT DETECTED Final   Candida  parapsilosis NOT DETECTED NOT DETECTED Final   Candida tropicalis NOT DETECTED NOT DETECTED Final   Cryptococcus neoformans/gattii NOT DETECTED NOT DETECTED Final    Comment: Performed at [facility], [address], [place], [place] [address]  Resp Panel by RT-PCR (Flu A&B, Covid) Nasopharyngeal Swab     Status: None   Collection Time: 05/30/21  4:21 AM   Specimen: Nasopharyngeal Swab; Nasopharyngeal(NP) swabs in vial transport medium  Result Value Ref Range Status   SARS Coronavirus 2 by RT PCR NEGATIVE NEGATIVE Final    Comment: (NOTE) SARS-CoV-2 target nucleic acids are NOT DETECTED.  The SARS-CoV-2 RNA is generally detectable in upper respiratory specimens during the acute phase of infection. The lowest concentration of SARS-CoV-2 viral copies this assay can detect is 138 copies/mL. A negative result does not preclude SARS-Cov-2 infection and should not be used as the sole basis for treatment or other patient management decisions. A negative result may occur with  improper specimen collection/handling, submission of specimen other than nasopharyngeal swab, presence of viral mutation(s) within the areas targeted by this assay, and inadequate number of viral copies(<138 copies/mL). A negative result must be combined with clinical observations, patient history, and epidemiological information. The expected result is Negative.  Fact Sheet for Patients:  BloggerCourse.com  Fact Sheet for Healthcare Providers:  SeriousBroker.it  This test is no t yet approved or cleared by the [place] FDA and  has been authorized for detection and/or diagnosis of SARS-CoV-2 by FDA under an Emergency Use Authorization (EUA). This EUA will remain  in effect (meaning this test can be used) for the duration of the  COVID-19 declaration under Section 564(b)(1) of the Act, 21 U.S.C.section 360bbb-3(b)(1), unless the authorization is terminated   or revoked sooner.       Influenza A by PCR NEGATIVE NEGATIVE Final   Influenza B by PCR NEGATIVE NEGATIVE Final    Comment: (NOTE) The Xpert Xpress SARS-CoV-2/FLU/RSV plus assay is intended as an aid in the diagnosis of influenza from Nasopharyngeal swab specimens and should not be used as a sole basis for treatment. Nasal washings and aspirates are unacceptable for Xpert Xpress SARS-CoV-2/FLU/RSV testing.  Fact Sheet for Patients: BloggerCourse.com  Fact Sheet for Healthcare Providers: SeriousBroker.it  This test is not yet approved or cleared by the [place] FDA and has been authorized for detection and/or diagnosis of SARS-CoV-2 by FDA under an Emergency Use Authorization (EUA). This EUA will remain in effect (meaning this test can be used) for the duration of the COVID-19 declaration under Section 564(b)(1) of the Act, 21 U.S.C. section 360bbb-3(b)(1), unless the authorization is terminated or revoked.  Performed at [facility], [address], [place], [place] [address]   MRSA Next Gen by PCR, Nasal     Status: Abnormal   Collection Time: 05/31/21 10:14 AM   Specimen: Nasal Mucosa; Nasal Swab  Result Value Ref Range Status   MRSA by PCR Next Gen DETECTED (A) NOT DETECTED Final    Comment: RESULT CALLED TO, READ BACK BY AND VERIFIED WITH: C. APPLEWHITE RN, AT 1211 05/31/21 D. VANHOOK (NOTE) The GeneXpert MRSA Assay (FDA approved for NASAL specimens only), is one component of a comprehensive MRSA colonization surveillance program. It is not intended to diagnose MRSA infection nor to guide or monitor treatment for MRSA infections. Test performance is not FDA approved in patients less than 53 years old. Performed at [facility], [address], [place], [place] [address]    Time coordinating discharge: 25 minutes  SIGNED: Lanae Boast, MD  [facility] 06/03/2021, 2:09 PM  If  7PM-7AM, please contact night-coverage [email or web address]

## 2021-06-03 NOTE — Progress Notes (Signed)
DISCHARGE NOTE HOME Steve [name] to be discharged Home per MD order. Discussed prescriptions and follow up appointments with the patient. Prescriptions given to patient; medication list explained in detail. Patient verbalized understanding.  Skin clean, dry and intact without evidence of skin break down, no evidence of skin tears noted. IV catheter discontinued intact. Site without signs and symptoms of complications. Dressing and pressure applied. Pt denies pain at the site currently. No complaints noted.  Patient free of lines, drains, and wounds.   An After Visit Summary (AVS) was printed and given to the patient. Patient escorted via wheelchair, and discharged home via private auto.  Lorine Bears, RN

## 2021-06-03 NOTE — Progress Notes (Signed)
Brief ID note:  Patient's blood culture resulted with Pseudomonas Putida that is pansensitive including fluoroquinolones.  He is anxious to discharge home.  We will plan for 2 weeks of treatment for sacral soft tissue infection as previously outlined which will also cover his bacteremia.  Okay to discharge on doxycycline, ciprofloxacin, metronidazole through 06/14/21.  Of note, he does have a superficial wound culture that grew E. coli, however, unlikely of much clinical significance given that it was not a deep operative culture.  Okay to discharge from ID standpoint once antibiotics arranged.  Please call as needed.   Vedia Coffer for Infectious Disease [facility] 06/03/2021, 2:17 PM

## 2021-06-04 LAB — CULTURE, BLOOD (ROUTINE X 2)
Culture: NO GROWTH
Special Requests: ADEQUATE

## 2021-07-10 ENCOUNTER — Inpatient Hospital Stay: Payer: No Typology Code available for payment source | Admitting: Internal Medicine

## 2021-07-26 ENCOUNTER — Emergency Department (HOSPITAL_COMMUNITY): Payer: No Typology Code available for payment source

## 2021-07-26 ENCOUNTER — Inpatient Hospital Stay (HOSPITAL_COMMUNITY)
Admission: EM | Admit: 2021-07-26 | Discharge: 2021-08-03 | DRG: 539 | Discharge status: Against Medical Advice | Payer: No Typology Code available for payment source | Attending: Internal Medicine | Admitting: Internal Medicine

## 2021-07-26 DIAGNOSIS — L97529 Non-pressure chronic ulcer of other part of left foot with unspecified severity: Secondary | ICD-10-CM | POA: Diagnosis present

## 2021-07-26 DIAGNOSIS — F1910 Other psychoactive substance abuse, uncomplicated: Secondary | ICD-10-CM | POA: Diagnosis present

## 2021-07-26 DIAGNOSIS — F200 Paranoid schizophrenia: Secondary | ICD-10-CM | POA: Diagnosis present

## 2021-07-26 DIAGNOSIS — Z20822 Contact with and (suspected) exposure to covid-19: Secondary | ICD-10-CM | POA: Diagnosis present

## 2021-07-26 DIAGNOSIS — D649 Anemia, unspecified: Secondary | ICD-10-CM

## 2021-07-26 DIAGNOSIS — G9349 Other encephalopathy: Secondary | ICD-10-CM | POA: Diagnosis present

## 2021-07-26 DIAGNOSIS — L8990 Pressure ulcer of unspecified site, unspecified stage: Secondary | ICD-10-CM | POA: Diagnosis present

## 2021-07-26 DIAGNOSIS — D62 Acute posthemorrhagic anemia: Secondary | ICD-10-CM | POA: Diagnosis present

## 2021-07-26 DIAGNOSIS — L89324 Pressure ulcer of left buttock, stage 4: Secondary | ICD-10-CM | POA: Diagnosis present

## 2021-07-26 DIAGNOSIS — B9562 Methicillin resistant Staphylococcus aureus infection as the cause of diseases classified elsewhere: Secondary | ICD-10-CM | POA: Diagnosis present

## 2021-07-26 DIAGNOSIS — F1990 Other psychoactive substance use, unspecified, uncomplicated: Secondary | ICD-10-CM | POA: Diagnosis present

## 2021-07-26 DIAGNOSIS — G9340 Encephalopathy, unspecified: Secondary | ICD-10-CM

## 2021-07-26 DIAGNOSIS — L89154 Pressure ulcer of sacral region, stage 4: Secondary | ICD-10-CM | POA: Diagnosis present

## 2021-07-26 DIAGNOSIS — D638 Anemia in other chronic diseases classified elsewhere: Secondary | ICD-10-CM | POA: Diagnosis present

## 2021-07-26 DIAGNOSIS — R52 Pain, unspecified: Secondary | ICD-10-CM

## 2021-07-26 DIAGNOSIS — B965 Pseudomonas (aeruginosa) (mallei) (pseudomallei) as the cause of diseases classified elsewhere: Secondary | ICD-10-CM | POA: Diagnosis present

## 2021-07-26 DIAGNOSIS — M4628 Osteomyelitis of vertebra, sacral and sacrococcygeal region: Principal | ICD-10-CM | POA: Diagnosis present

## 2021-07-26 DIAGNOSIS — F17210 Nicotine dependence, cigarettes, uncomplicated: Secondary | ICD-10-CM | POA: Diagnosis present

## 2021-07-26 DIAGNOSIS — F4312 Post-traumatic stress disorder, chronic: Secondary | ICD-10-CM | POA: Diagnosis present

## 2021-07-26 DIAGNOSIS — Z79899 Other long term (current) drug therapy: Secondary | ICD-10-CM

## 2021-07-26 DIAGNOSIS — F1510 Other stimulant abuse, uncomplicated: Secondary | ICD-10-CM | POA: Diagnosis present

## 2021-07-26 DIAGNOSIS — R410 Disorientation, unspecified: Secondary | ICD-10-CM | POA: Diagnosis not present

## 2021-07-26 DIAGNOSIS — G8254 Quadriplegia, C5-C7 incomplete: Secondary | ICD-10-CM | POA: Diagnosis present

## 2021-07-26 DIAGNOSIS — Z981 Arthrodesis status: Secondary | ICD-10-CM

## 2021-07-26 LAB — CBC WITH DIFFERENTIAL/PLATELET
Abs Immature Granulocytes: 0.07 10*3/uL (ref 0.00–0.07)
Basophils Absolute: 0 10*3/uL (ref 0.0–0.1)
Basophils Relative: 0 %
Eosinophils Absolute: 0.2 10*3/uL (ref 0.0–0.5)
Eosinophils Relative: 2 %
HCT: 16.4 % — ABNORMAL LOW (ref 39.0–52.0)
Hemoglobin: 4.8 g/dL — CL (ref 13.0–17.0)
Immature Granulocytes: 1 %
Lymphocytes Relative: 12 %
Lymphs Abs: 1.2 10*3/uL (ref 0.7–4.0)
MCH: 26.1 pg (ref 26.0–34.0)
MCHC: 29.3 g/dL — ABNORMAL LOW (ref 30.0–36.0)
MCV: 89.1 fL (ref 80.0–100.0)
Monocytes Absolute: 0.7 10*3/uL (ref 0.1–1.0)
Monocytes Relative: 7 %
Neutro Abs: 7.6 10*3/uL (ref 1.7–7.7)
Neutrophils Relative %: 78 %
Platelets: 359 10*3/uL (ref 150–400)
RBC: 1.84 MIL/uL — ABNORMAL LOW (ref 4.22–5.81)
RDW: 15.7 % — ABNORMAL HIGH (ref 11.5–15.5)
WBC: 9.7 10*3/uL (ref 4.0–10.5)
nRBC: 0 % (ref 0.0–0.2)

## 2021-07-26 MED ORDER — VANCOMYCIN HCL 1500 MG/300ML IV SOLN
20.0000 mg/kg | Freq: Once | INTRAVENOUS | Status: AC
  Administered 2021-07-27: 1500 mg via INTRAVENOUS
  Filled 2021-07-26: qty 300

## 2021-07-26 MED ORDER — METRONIDAZOLE 500 MG/100ML IV SOLN
500.0000 mg | Freq: Once | INTRAVENOUS | Status: AC
  Administered 2021-07-26: 500 mg via INTRAVENOUS
  Filled 2021-07-26: qty 100

## 2021-07-26 MED ORDER — LACTATED RINGERS IV BOLUS (SEPSIS)
1000.0000 mL | Freq: Once | INTRAVENOUS | Status: AC
  Administered 2021-07-26: 1000 mL via INTRAVENOUS

## 2021-07-26 MED ORDER — LACTATED RINGERS IV BOLUS (SEPSIS)
500.0000 mL | Freq: Once | INTRAVENOUS | Status: AC
  Administered 2021-07-27: 500 mL via INTRAVENOUS

## 2021-07-26 MED ORDER — SODIUM CHLORIDE 0.9 % IV SOLN
2.0000 g | Freq: Once | INTRAVENOUS | Status: AC
  Administered 2021-07-26: 2 g via INTRAVENOUS
  Filled 2021-07-26: qty 2

## 2021-07-26 MED ORDER — LACTATED RINGERS IV SOLN: INTRAVENOUS | Status: DC

## 2021-07-26 NOTE — ED Triage Notes (Signed)
Pt from home via [facility], called for AMS, pt naked on EMS arrival, incoherent, talking of "snakes" on legs, masturbating. 102F via infared. 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 Functional quad Meth on board?  Hx schizophrenia, PTSD  Wounds in varying stages   18 L arm fluid, 5versed & 5haldol 100/80 ->130/88, HR 120/-> 102, RR 36

## 2021-07-26 NOTE — ED Notes (Signed)
MD notified of critical hgb

## 2021-07-27 ENCOUNTER — Inpatient Hospital Stay (HOSPITAL_COMMUNITY): Payer: No Typology Code available for payment source

## 2021-07-27 ENCOUNTER — Emergency Department (HOSPITAL_COMMUNITY): Payer: No Typology Code available for payment source

## 2021-07-27 ENCOUNTER — Encounter (HOSPITAL_COMMUNITY): Payer: Self-pay | Admitting: Internal Medicine

## 2021-07-27 DIAGNOSIS — Z79899 Other long term (current) drug therapy: Secondary | ICD-10-CM | POA: Diagnosis not present

## 2021-07-27 DIAGNOSIS — R7881 Bacteremia: Secondary | ICD-10-CM | POA: Diagnosis not present

## 2021-07-27 DIAGNOSIS — L97529 Non-pressure chronic ulcer of other part of left foot with unspecified severity: Secondary | ICD-10-CM | POA: Diagnosis present

## 2021-07-27 DIAGNOSIS — R410 Disorientation, unspecified: Secondary | ICD-10-CM | POA: Diagnosis present

## 2021-07-27 DIAGNOSIS — D649 Anemia, unspecified: Secondary | ICD-10-CM | POA: Diagnosis present

## 2021-07-27 DIAGNOSIS — D62 Acute posthemorrhagic anemia: Secondary | ICD-10-CM | POA: Diagnosis present

## 2021-07-27 DIAGNOSIS — D638 Anemia in other chronic diseases classified elsewhere: Secondary | ICD-10-CM | POA: Diagnosis present

## 2021-07-27 DIAGNOSIS — Z981 Arthrodesis status: Secondary | ICD-10-CM | POA: Diagnosis not present

## 2021-07-27 DIAGNOSIS — B9562 Methicillin resistant Staphylococcus aureus infection as the cause of diseases classified elsewhere: Secondary | ICD-10-CM | POA: Diagnosis present

## 2021-07-27 DIAGNOSIS — L89324 Pressure ulcer of left buttock, stage 4: Secondary | ICD-10-CM | POA: Diagnosis present

## 2021-07-27 DIAGNOSIS — M4628 Osteomyelitis of vertebra, sacral and sacrococcygeal region: Secondary | ICD-10-CM | POA: Diagnosis present

## 2021-07-27 DIAGNOSIS — F17210 Nicotine dependence, cigarettes, uncomplicated: Secondary | ICD-10-CM | POA: Diagnosis present

## 2021-07-27 DIAGNOSIS — F4312 Post-traumatic stress disorder, chronic: Secondary | ICD-10-CM | POA: Diagnosis present

## 2021-07-27 DIAGNOSIS — Z20822 Contact with and (suspected) exposure to covid-19: Secondary | ICD-10-CM | POA: Diagnosis present

## 2021-07-27 DIAGNOSIS — F200 Paranoid schizophrenia: Secondary | ICD-10-CM | POA: Diagnosis present

## 2021-07-27 DIAGNOSIS — L89154 Pressure ulcer of sacral region, stage 4: Secondary | ICD-10-CM | POA: Diagnosis present

## 2021-07-27 DIAGNOSIS — B965 Pseudomonas (aeruginosa) (mallei) (pseudomallei) as the cause of diseases classified elsewhere: Secondary | ICD-10-CM | POA: Diagnosis present

## 2021-07-27 DIAGNOSIS — F1510 Other stimulant abuse, uncomplicated: Secondary | ICD-10-CM | POA: Diagnosis present

## 2021-07-27 DIAGNOSIS — G8254 Quadriplegia, C5-C7 incomplete: Secondary | ICD-10-CM | POA: Diagnosis present

## 2021-07-27 DIAGNOSIS — G9349 Other encephalopathy: Secondary | ICD-10-CM | POA: Diagnosis present

## 2021-07-27 LAB — CBC WITH DIFFERENTIAL/PLATELET
Abs Immature Granulocytes: 0.04 10*3/uL (ref 0.00–0.07)
Basophils Absolute: 0 10*3/uL (ref 0.0–0.1)
Basophils Relative: 0 %
Eosinophils Absolute: 0.3 10*3/uL (ref 0.0–0.5)
Eosinophils Relative: 5 %
HCT: 31.1 % — ABNORMAL LOW (ref 39.0–52.0)
Hemoglobin: 10 g/dL — ABNORMAL LOW (ref 13.0–17.0)
Immature Granulocytes: 1 %
Lymphocytes Relative: 17 %
Lymphs Abs: 1 10*3/uL (ref 0.7–4.0)
MCH: 27 pg (ref 26.0–34.0)
MCHC: 32.2 g/dL (ref 30.0–36.0)
MCV: 83.8 fL (ref 80.0–100.0)
Monocytes Absolute: 0.3 10*3/uL (ref 0.1–1.0)
Monocytes Relative: 6 %
Neutro Abs: 4 10*3/uL (ref 1.7–7.7)
Neutrophils Relative %: 71 %
Platelets: 321 10*3/uL (ref 150–400)
RBC: 3.71 MIL/uL — ABNORMAL LOW (ref 4.22–5.81)
RDW: 15.2 % (ref 11.5–15.5)
WBC: 5.6 10*3/uL (ref 4.0–10.5)
nRBC: 0 % (ref 0.0–0.2)

## 2021-07-27 LAB — COMPREHENSIVE METABOLIC PANEL
ALT: 8 U/L (ref 0–44)
AST: 16 U/L (ref 15–41)
Albumin: 2.2 g/dL — ABNORMAL LOW (ref 3.5–5.0)
Alkaline Phosphatase: 60 U/L (ref 38–126)
Anion gap: 10 (ref 5–15)
BUN: 8 mg/dL (ref 6–20)
CO2: 21 mmol/L — ABNORMAL LOW (ref 22–32)
Calcium: 8 mg/dL — ABNORMAL LOW (ref 8.9–10.3)
Chloride: 100 mmol/L (ref 98–111)
Creatinine, Ser: 0.76 mg/dL (ref 0.61–1.24)
GFR, Estimated: >60 mL/min (ref >60)
Glucose, Bld: 117 mg/dL — ABNORMAL HIGH (ref 70–99)
Potassium: 3.7 mmol/L (ref 3.5–5.1)
Sodium: 131 mmol/L — ABNORMAL LOW (ref 135–145)
Total Bilirubin: 0.3 mg/dL (ref 0.3–1.2)
Total Protein: 6.4 g/dL — ABNORMAL LOW (ref 6.5–8.1)

## 2021-07-27 LAB — URINALYSIS, ROUTINE W REFLEX MICROSCOPIC
Bilirubin Urine: NEGATIVE
Glucose, UA: NEGATIVE mg/dL
Ketones, ur: NEGATIVE mg/dL
Nitrite: NEGATIVE
Protein, ur: NEGATIVE mg/dL
Specific Gravity, Urine: 1.003 — ABNORMAL LOW (ref 1.005–1.030)
WBC, UA: >50 WBC/hpf — ABNORMAL HIGH (ref 0–5)
pH: 8 (ref 5.0–8.0)

## 2021-07-27 LAB — CBG MONITORING, ED: Glucose-Capillary: 129 mg/dL — ABNORMAL HIGH (ref 70–99)

## 2021-07-27 LAB — URINE CULTURE

## 2021-07-27 LAB — ABO/RH: ABO/RH(D): A POS

## 2021-07-27 LAB — RESP PANEL BY RT-PCR (FLU A&B, COVID) ARPGX2
Influenza A by PCR: NEGATIVE
Influenza B by PCR: NEGATIVE
SARS Coronavirus 2 by RT PCR: NEGATIVE

## 2021-07-27 LAB — TROPONIN I (HIGH SENSITIVITY): Troponin I (High Sensitivity): 4 ng/L (ref <18)

## 2021-07-27 LAB — PREPARE RBC (CROSSMATCH)

## 2021-07-27 LAB — LACTIC ACID, PLASMA
Lactic Acid, Venous: 1.2 mmol/L (ref 0.5–1.9)
Lactic Acid, Venous: 2 mmol/L (ref 0.5–1.9)

## 2021-07-27 LAB — MRSA NEXT GEN BY PCR, NASAL: MRSA by PCR Next Gen: DETECTED — AB

## 2021-07-27 LAB — GLUCOSE, CAPILLARY: Glucose-Capillary: 129 mg/dL — ABNORMAL HIGH (ref 70–99)

## 2021-07-27 MED ORDER — SODIUM CHLORIDE 0.9 % IV SOLN: 10.0000 mL/h | Freq: Once | INTRAVENOUS | Status: DC

## 2021-07-27 MED ORDER — ACETAMINOPHEN 325 MG PO TABS: 650.0000 mg | ORAL_TABLET | Freq: Four times a day (QID) | ORAL | Status: DC | PRN

## 2021-07-27 MED ORDER — THIAMINE HCL 100 MG PO TABS
100.0000 mg | ORAL_TABLET | Freq: Every day | ORAL | Status: DC
  Administered 2021-07-27 – 2021-08-02 (×7): 100 mg via ORAL
  Filled 2021-07-27 (×7): qty 1

## 2021-07-27 MED ORDER — LACTATED RINGERS IV SOLN: INTRAVENOUS | Status: AC

## 2021-07-27 MED ORDER — THIAMINE HCL 100 MG/ML IJ SOLN
100.0000 mg | Freq: Every day | INTRAMUSCULAR | Status: DC
  Filled 2021-07-27 (×2): qty 2

## 2021-07-27 MED ORDER — IOHEXOL 300 MG/ML  SOLN
75.0000 mL | Freq: Once | INTRAMUSCULAR | Status: AC | PRN
  Administered 2021-07-27: 75 mL via INTRAVENOUS

## 2021-07-27 MED ORDER — METRONIDAZOLE 500 MG/100ML IV SOLN
500.0000 mg | Freq: Two times a day (BID) | INTRAVENOUS | Status: DC
  Administered 2021-07-27 (×2): 500 mg via INTRAVENOUS
  Filled 2021-07-27 (×2): qty 100

## 2021-07-27 MED ORDER — DOCUSATE SODIUM 100 MG PO CAPS
100.0000 mg | ORAL_CAPSULE | Freq: Two times a day (BID) | ORAL | Status: DC
  Administered 2021-07-27 – 2021-07-29 (×2): 100 mg via ORAL
  Filled 2021-07-27 (×9): qty 1

## 2021-07-27 MED ORDER — SODIUM CHLORIDE 0.9 % IV SOLN
2.0000 g | Freq: Three times a day (TID) | INTRAVENOUS | Status: DC
  Administered 2021-07-27 – 2021-07-29 (×7): 2 g via INTRAVENOUS
  Filled 2021-07-27 (×7): qty 2

## 2021-07-27 MED ORDER — ACETAMINOPHEN 650 MG RE SUPP: 650.0000 mg | Freq: Four times a day (QID) | RECTAL | Status: DC | PRN

## 2021-07-27 MED ORDER — SODIUM CHLORIDE 0.9 % IV SOLN: 2.0000 g | Freq: Once | INTRAVENOUS | Status: DC

## 2021-07-27 MED ORDER — CHLORHEXIDINE GLUCONATE CLOTH 2 % EX PADS
6.0000 | MEDICATED_PAD | Freq: Every day | CUTANEOUS | Status: DC
  Administered 2021-07-27 – 2021-07-29 (×3): 6 via TOPICAL

## 2021-07-27 MED ORDER — BUPRENORPHINE HCL 2 MG SL SUBL
4.0000 mg | SUBLINGUAL_TABLET | Freq: Once | SUBLINGUAL | Status: AC
  Administered 2021-07-28: 4 mg via SUBLINGUAL
  Filled 2021-07-27: qty 2

## 2021-07-27 MED ORDER — VANCOMYCIN HCL 1000 MG/200ML IV SOLN: 1000.0000 mg | Freq: Once | INTRAVENOUS | Status: DC

## 2021-07-27 MED ORDER — ADULT MULTIVITAMIN W/MINERALS CH
1.0000 | ORAL_TABLET | Freq: Every day | ORAL | Status: DC
  Administered 2021-07-27 – 2021-08-02 (×7): 1 via ORAL
  Filled 2021-07-27 (×7): qty 1

## 2021-07-27 MED ORDER — VANCOMYCIN HCL 1000 MG/200ML IV SOLN
1000.0000 mg | Freq: Three times a day (TID) | INTRAVENOUS | Status: DC
  Administered 2021-07-27 – 2021-07-28 (×4): 1000 mg via INTRAVENOUS
  Filled 2021-07-27 (×6): qty 200

## 2021-07-27 MED ORDER — FOLIC ACID 1 MG PO TABS
1.0000 mg | ORAL_TABLET | Freq: Every day | ORAL | Status: DC
  Administered 2021-07-27 – 2021-08-02 (×7): 1 mg via ORAL
  Filled 2021-07-27 (×7): qty 1

## 2021-07-27 NOTE — Progress Notes (Signed)
Unable to complete admission process. Patient unable to answer any questions at this time. Attempted to contact Mother, unsuccessful.

## 2021-07-27 NOTE — Progress Notes (Signed)
Explained to pt exam was 28min's long. Staff took great time and care in trying to make pt as comfortable as possible. Pt did not even get through the first sequence before banging on scanner and refusing to continue. Pt sent back to room.

## 2021-07-27 NOTE — ED Notes (Signed)
Pt transported to CT with this RN 

## 2021-07-27 NOTE — ED Notes (Signed)
Pt a little more awake now, able to call out and asked to be laid down - able to tell me his name, birthday, and that he is at the hospital.

## 2021-07-27 NOTE — Progress Notes (Signed)
The patient is a 30 yr old man who presented to [facility] ED with complaints of confusion and bleeding from his left second toe which had an ulcer. The patient had been using methamphetamine.   The patient has a medical history significant for sacral decubitus ulcer with osteomyelitis diagnosed in 05/2021, multiple decubitus ulcers including a large left buttock ulcer. He is a quadriplegic following a C spine injury. He also has anxiety, paranoid schizophrenia, and PTSD.   In the ED the patient was found to have a hemoglobin of 4.8 upon presentation. He received 3 units PRBC's in the ED. He was found to be bleeding not only from the toe, but also his chronic sacral wound. The patient was found to have a lactic acid of 2.0. WBC was 9.7. Hemoglobin following transfusion was 10.0.  The patient was admitted by my colleague, Dr. Toniann Fail early this morning. Wound care was consulted. It was felt that no debridement was necessary at this time. The patient was less than compliant with the examination.  MRI of the pelvis was ordered, but the patient refused the procedure.   Vitals signs and lab results are within normal limits.  Upon my visit the patient is sleeping. He stirs to stimulation, but goes back to sleep. Heart and lung exam is within normal limits. Abdomen is soft, non-tender, non-distended. Extremities are negative for cyanosis, clubbing and edema.

## 2021-07-27 NOTE — Progress Notes (Signed)
Pharmacy Antibiotic Note  Steve [name] is a 30 y.o. male admitted on 07/26/2021 with  sacral osteomyelitis .  Pharmacy has been consulted for Vancomycin/Cefepime dosing. WBC WNL. Renal function ok.   Plan: Vancomycin 1000 mg IV q8h >>>Estimated AUC: 419 Cefepime 2g IV q8h Trend WBC, temp, renal function  F/U infectious work-up Drug levels as indicated   Height: 6' (182.9 cm) Weight: 75 kg (165 lb 5.5 oz) IBW/kg (Calculated) : 77.6  Temp (24hrs), Avg:97.5 F (36.4 C), Min:96.5 F (35.8 C), Max:99.5 F (37.5 C)  Recent Labs  Lab 07/26/21 2258 07/27/21 0058 07/27/21 0236  WBC 9.7  --   --   CREATININE  --   --  0.76  LATICACIDVEN 2.0* 1.2  --     Estimated Creatinine Clearance: 141.9 mL/min (by C-G formula based on SCr of 0.76 mg/dL).    Allergies  Allergen Reactions   Caffeine Anaphylaxis   Caffeine Anaphylaxis   Chocolate Anaphylaxis   Chocolate Anaphylaxis   Tuberculin     Other reaction(s): Eruption of skin   Sulfa Antibiotics Rash   Sulfa Antibiotics Rash    Abran Duke, PharmD, BCPS Clinical Pharmacist Phone: [phone]

## 2021-07-27 NOTE — ED Notes (Signed)
Blood consent signed by wife and at bedside - wife signed due to pt altered mental status

## 2021-07-27 NOTE — H&P (Addendum)
History and Physical    Steve [name] KDX:[id number] DOB: 05/03/1991 DOA: 07/26/2021  PCP: [facility] [place]  Patient coming from: Home.  Chief Complaint: Confusion.  Bleeding from the sacral ulcer.  HPI: Steve [name] is a 30 y.o. male with history of C-spine injury with quadriplegia who has multiple decubitus ulcer including a large left buttock ulcer admitted in September of this year for infected sacral decubitus ulcer with osteomyelitis, EMS was called to his house after patient was found to be confused and bleeding from his left second toe with ulcer.  It is noted that patient may have taken meth.  Patient also was febrile on arrival with temperature of 102 F.  EMS noted that patient was bleeding from his left sacral wound and had to be packed.  Patient was brought to the ER.  ED Course: In the ER patient initially was confused and appeared septic was given sepsis protocol fluids empiric antibiotic and blood cultures drawn.  The left sacral wound had stopped bleeding by then.  After the fluids and antibiotics were given patient became more alert awake but still lethargic but following commands.  Labs are significant for hemoglobin of 4.8 which dropped from 8.6 in September 2022 2 months ago.  CT head was unremarkable CT pelvis shows features concerning for new osteomyelitis around the left sacral area.  Patient admitted for further work-up.  COVID test is negative.  Review of Systems: As per HPI, rest all negative.   Past Medical History:  Diagnosis Date   Anxiety    Paranoid schizophrenia ([place])    PTSD (post-traumatic stress disorder) Paranoid    Past Surgical History:  Procedure Laterality Date   banding procedure for morbid obesity     I & D EXTREMITY Right 02/27/2014   Procedure: IRRIGATION AND DEBRIDEMENT FOREARM AND REPAIR OF 30cm LACERATION;  Surgeon: Johnny Bridge, MD;  Location: [place];  Service: Orthopedics;  Laterality: Right;  [facility] with [facility]      reports that he has been smoking cigarettes. He has been smoking an average of .25 packs per day. He has never used smokeless tobacco. He reports that he does not currently use alcohol. He reports current drug use. Drugs: Marijuana and Methamphetamines.  Allergies  Allergen Reactions   Caffeine Anaphylaxis   Caffeine Anaphylaxis   Chocolate Anaphylaxis   Chocolate Anaphylaxis   Tuberculin     Other reaction(s): Eruption of skin   Sulfa Antibiotics Rash   Sulfa Antibiotics Rash    Family History  Problem Relation Age of Onset   Depression Mother    Bipolar disorder Father     Prior to Admission medications   Medication Sig Start Date End Date Taking? Authorizing Provider  bacitracin ointment Apply 1 application topically 2 (two) times daily. Patient not taking: No sig reported 05/15/21   Horton, Barbette Hair, MD  docusate sodium (COLACE) 100 MG capsule Take 1 capsule (100 mg total) by mouth 2 (two) times daily. 06/03/21   Antonieta Pert, MD  ferrous sulfate 325 (65 FE) MG tablet Take 1 tablet (325 mg total) by mouth 2 (two) times daily with a meal. 06/03/21 07/03/21  Antonieta Pert, MD  mupirocin ointment (BACTROBAN) 2 % Place 1 application into the nose 2 (two) times daily. 06/03/21   Antonieta Pert, MD  OLANZapine (ZYPREXA) 2.5 MG tablet Take 1 tablet (2.5 mg total) by mouth at bedtime. Patient not taking: Reported on 09/30/2018 09/10/17 09/14/19  Orlie Dakin, MD  prazosin (  MINIPRESS) 2 MG capsule Take 1 capsule (2 mg total) by mouth at bedtime. Patient not taking: Reported on 09/03/2017 11/04/16 09/14/19  Lurena Nida, NP    Physical Exam: Constitutional: Moderately built and nourished. Vitals:   07/27/21 0430 07/27/21 0445 07/27/21 0545 07/27/21 0600  BP: (!) 125/91 (!) 141/82 (!) 120/99 128/88  Pulse: 87 74 66 65  Resp: _0 Temp: (!) 97.4 F (36.3 C) (!) 97.3 F (36.3 C) 97.7 F (36.5 C) 98 F (36.7 C)  TempSrc: Core (Comment)     SpO2: 99% 99% 99% 99%  Weight:       Height:       Eyes: Anicteric no pallor. ENMT: No discharge from the ears eyes nose and mouth. Neck: No mass felt.  No neck rigidity. Respiratory: No rhonchi or crepitations. Cardiovascular: S1-S2 heard. Abdomen: Soft nontender bowel sound present. Musculoskeletal: Mild edema of the both lower extremities with multiple decubitus ulcers. Skin: Multiple decubitus ulcers with large wound in the left buttock area. Neurologic: Patient is lethargic but following commands. Psychiatric: Lethargic.   Labs on Admission: I have personally reviewed following labs and imaging studies  CBC: Recent Labs  Lab 07/26/21 2258  WBC 9.7  NEUTROABS 7.6  HGB 4.8*  HCT 16.4*  MCV 89.1  PLT 014   Basic Metabolic Panel: Recent Labs  Lab 07/27/21 0236  NA 131*  K 3.7  CL 100  CO2 21*  GLUCOSE 117*  BUN 8  CREATININE 0.76  CALCIUM 8.0*   GFR: Estimated Creatinine Clearance: 141.9 mL/min (by C-G formula based on SCr of 0.76 mg/dL). Liver Function Tests: Recent Labs  Lab 07/27/21 0236  AST 16  ALT 8  ALKPHOS 60  BILITOT 0.3  PROT 6.4*  ALBUMIN 2.2*   No results for input(s): LIPASE, AMYLASE in the last 168 hours. No results for input(s): AMMONIA in the last 168 hours. Coagulation Profile: No results for input(s): INR, PROTIME in the last 168 hours. Cardiac Enzymes: No results for input(s): CKTOTAL, CKMB, CKMBINDEX, TROPONINI in the last 168 hours. BNP (last 3 results) No results for input(s): PROBNP in the last 8760 hours. HbA1C: No results for input(s): HGBA1C in the last 72 hours. CBG: No results for input(s): GLUCAP in the last 168 hours. Lipid Profile: No results for input(s): CHOL, HDL, LDLCALC, TRIG, CHOLHDL, LDLDIRECT in the last 72 hours. Thyroid Function Tests: No results for input(s): TSH, T4TOTAL, FREET4, T3FREE, THYROIDAB in the last 72 hours. Anemia Panel: No results for input(s): VITAMINB12, FOLATE, FERRITIN, TIBC, IRON, RETICCTPCT in the last 72 hours. Urine  analysis:    Component Value Date/Time   COLORURINE YELLOW 07/26/2021 2258   APPEARANCEUR HAZY (A) 07/26/2021 2258   APPEARANCEUR Hazy 05/08/2013 0330   LABSPEC 1.003 (L) 07/26/2021 2258   LABSPEC 1.034 05/08/2013 0330   PHURINE 8.0 07/26/2021 2258   GLUCOSEU NEGATIVE 07/26/2021 2258   GLUCOSEU Negative 05/08/2013 0330   HGBUR SMALL (A) 07/26/2021 2258   BILIRUBINUR NEGATIVE 07/26/2021 2258   BILIRUBINUR 1+ 05/08/2013 0330   KETONESUR NEGATIVE 07/26/2021 2258   PROTEINUR NEGATIVE 07/26/2021 2258   NITRITE NEGATIVE 07/26/2021 2258   LEUKOCYTESUR LARGE (A) 07/26/2021 2258   LEUKOCYTESUR Negative 05/08/2013 0330   Sepsis Labs: _1 (procalcitonin:4,lacticidven:4) ) Recent Results (from the past 240 hour(s))  Resp Panel by RT-PCR (Flu A&B, Covid) Nasopharyngeal Swab     Status: None   Collection Time: 07/26/21 10:58 PM   Specimen: Nasopharyngeal Swab; Nasopharyngeal(NP) swabs in vial transport medium  Result Value Ref Range Status   SARS Coronavirus 2 by RT PCR NEGATIVE NEGATIVE Final    Comment: (NOTE) SARS-CoV-2 target nucleic acids are NOT DETECTED.  The SARS-CoV-2 RNA is generally detectable in upper respiratory specimens during the acute phase of infection. The lowest concentration of SARS-CoV-2 viral copies this assay can detect is 138 copies/mL. A negative result does not preclude SARS-Cov-2 infection and should not be used as the sole basis for treatment or other patient management decisions. A negative result may occur with  improper specimen collection/handling, submission of specimen other than nasopharyngeal swab, presence of viral mutation(s) within the areas targeted by this assay, and inadequate number of viral copies(<138 copies/mL). A negative result must be combined with clinical observations, patient history, and epidemiological information. The expected result is Negative.  Fact Sheet for Patients:  EntrepreneurPulse.com.au  Fact  Sheet for Healthcare Providers:  IncredibleEmployment.be  This test is no t yet approved or cleared by the [place] FDA and  has been authorized for detection and/or diagnosis of SARS-CoV-2 by FDA under an Emergency Use Authorization (EUA). This EUA will remain  in effect (meaning this test can be used) for the duration of the COVID-19 declaration under Section 564(b)(1) of the Act, 21 U.S.C.section 360bbb-3(b)(1), unless the authorization is terminated  or revoked sooner.       Influenza A by PCR NEGATIVE NEGATIVE Final   Influenza B by PCR NEGATIVE NEGATIVE Final    Comment: (NOTE) The Xpert Xpress SARS-CoV-2/FLU/RSV plus assay is intended as an aid in the diagnosis of influenza from Nasopharyngeal swab specimens and should not be used as a sole basis for treatment. Nasal washings and aspirates are unacceptable for Xpert Xpress SARS-CoV-2/FLU/RSV testing.  Fact Sheet for Patients: EntrepreneurPulse.com.au  Fact Sheet for Healthcare Providers: IncredibleEmployment.be  This test is not yet approved or cleared by the [place] FDA and has been authorized for detection and/or diagnosis of SARS-CoV-2 by FDA under an Emergency Use Authorization (EUA). This EUA will remain in effect (meaning this test can be used) for the duration of the COVID-19 declaration under Section 564(b)(1) of the Act, 21 U.S.C. section 360bbb-3(b)(1), unless the authorization is terminated or revoked.  Performed at [facility], [place] [address] [place], [place] [address]      Radiological Exams on Admission: CT HEAD WO CONTRAST (5MM)  Result Date: 07/27/2021 CLINICAL DATA:  Mental status change with unknown cause. EXAM: CT HEAD WITHOUT CONTRAST TECHNIQUE: Contiguous axial images were obtained from the base of the skull through the vertex without intravenous contrast. COMPARISON:  None. FINDINGS: Brain: No evidence of acute infarction,  hemorrhage, hydrocephalus, extra-axial collection or mass lesion/mass effect. Cerebral volume loss for age, without specific pattern. Vascular: No hyperdense vessel or unexpected calcification. Skull: Normal. Negative for fracture or focal lesion. Sinuses/Orbits: No acute finding. IMPRESSION: Stable head CT.  No acute finding. Electronically Signed   By: Jorje Guild M.D.   On: 07/27/2021 04:12   CT PELVIS W CONTRAST  Result Date: 07/27/2021 CLINICAL DATA:  Abscess, anal or rectal. EXAM: CT PELVIS WITH CONTRAST TECHNIQUE: Multidetector CT imaging of the pelvis was performed using the standard protocol following the bolus administration of intravenous contrast. CONTRAST:  72m OMNIPAQUE IOHEXOL 300 MG/ML  SOLN COMPARISON:  05/30/2021. FINDINGS: Urinary Tract: A Foley catheter is present in the urinary bladder. There is mild thickening of the bladder wall which may be in part due to incomplete distension. Bowel: No bowel obstruction, free air or pneumatosis. A moderate  amount of retained stool is present in the sigmoid colon and rectum. No perirectal or or anal abscess is identified, however examination is limited due to the lack of IV contrast. Vascular/Lymphatic: No pathologically enlarged lymph nodes. No significant vascular abnormality seen. Reproductive:  The prostate gland is normal in size Other: A trace amount of free fluid is noted anterior to the rectum. Musculoskeletal: There is a large left medial gluteal ulcer extending into the posterior soft tissues of the upper posterior left thigh. There is bony destruction of the ischial tuberosity on the left with multiple bony fragments in the soft tissues, new from the previous exam. There is asymmetric enlargement of the obturator internus muscle and gluteal muscles on the left, possible myositis. Edema is noted in the adductor muscles in the medial proximal left thigh. IMPRESSION: 1. No definite evidence of perirectal abscess. 2. Large open ulceration in  the medial gluteal region on the left. There is new bony destruction of the ischial tuberosity on the left, suggesting osteomyelitis. Enlargement of the gluteal muscles and obturator internus is noted on the left, possible myositis. MRI may be beneficial for further evaluation. 3. Foley catheter in the urinary bladder. The bladder wall appears mildly thickened which may be in part due to incomplete distension. Correlate clinically to exclude infectious or inflammatory cystitis. Electronically Signed   By: Brett Fairy M.D.   On: 07/27/2021 04:30   DG Chest Port 1 View  Result Date: 07/26/2021 CLINICAL DATA:  Possible sepsis. EXAM: PORTABLE CHEST 1 VIEW COMPARISON:  02/01/2021. FINDINGS: The heart size and mediastinal contours are within normal limits. Both lungs are clear. Anterior and posterior cervical spinal fusion hardware is noted. IMPRESSION: No acute cardiopulmonary process. Electronically Signed   By: Brett Fairy M.D.   On: 07/26/2021 23:29    EKG: Independently reviewed.  Normal sinus rhythm.  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  Follow CBC after transfusion.  Any further units of blood transfusion will require platelets and FFP.  At the time of my exam patient's sacral wound appears to have stopped bleeding.  We will also have to look into other source of bleeding. SIRS likely from anemia and possible infection of the sacral wound and also drug-related. Sacral osteomyelitis may need MRI for further study.  On empiric antibiotics.  Follow cultures.  We will need infectious disease input.  I have consulted wound team. Acute encephalopathy suspect likely drug related.  Mental status is improving after admitting to the  ER.  CT head is unremarkable.  We will continue to closely monitor. Polysubstance abuse patient has admitted to taking meth.  On CIWA at this time. Patient has multiple decubitus ulcers and there is also some swelling on the left and right ankle for which I have ordered x-rays. History of C-spine injury with quadriplegia.   Since patient has severe anemia with SIRS/sepsis with osteomyelitis will need close monitoring for any further worsening inpatient status.   DVT prophylaxis: SCDs.  Avoiding anticoagulation in the setting of severe anemia and blood loss. Code Status: Full code. Family Communication: We will need to discuss with patient's wife to get further history. Disposition Plan: To be determined. Consults called: Wound team. Admission status: Inpatient.  Rise Patience MD [facility] Pager [phone].  If 7PM-7AM, please contact night-coverage [email or web address] Password [email or web address]  07/27/2021, 6:30 AM

## 2021-07-27 NOTE — ED Notes (Signed)
Wife now at bedside

## 2021-07-27 NOTE — ED Notes (Signed)
Lab informed this RN that they do not have a blue top on this patient, due to pt getting blood transfusion unable to draw at this time - MD notified

## 2021-07-27 NOTE — ED Notes (Signed)
ED TO INPATIENT HANDOFF REPORT  ED Nurse Name and Phone #: [phone] Denita Lung Name/Age/Gender Steve [name] 30 y.o. male Room/Bed: 020C/020C  Code Status   Code Status: Full Code  Home/SNF/Other Home Patient oriented to: self and situation Is this baseline? No   Triage Complete: Triage complete  Chief Complaint Severe anemia [D64.9]  Triage Note Pt from home via [facility], called for AMS, pt naked on EMS arrival, incoherent, talking of "snakes" on legs, masturbating. 102F via infared. 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 Functional quad Meth on board?  Hx schizophrenia, PTSD  Wounds in varying stages   18 L arm 535m fluid, 5versed & 5haldol 100/80 ->130/88, HR 120/-> 102, RR 36   Allergies Allergies  Allergen Reactions   Caffeine Anaphylaxis   Caffeine Anaphylaxis   Chocolate Anaphylaxis   Chocolate Anaphylaxis   Tuberculin     Other reaction(s): Eruption of skin   Sulfa Antibiotics Rash   Sulfa Antibiotics Rash    Level of Care/Admitting Diagnosis ED Disposition     ED Disposition  Admit   Condition  --   [facility]]  Level of Care: Progressive [102]  Admit to Progressive based on following criteria: MULTISYSTEM THREATS such as stable sepsis, metabolic/electrolyte imbalance with or without encephalopathy that is responding to early treatment.  May admit patient to [facility] equivalent level of care is available:: No  Covid Evaluation: Asymptomatic Screening Protocol (No Symptoms)  Diagnosis: Severe anemia [[[id number]] Admitting Physician: KRise [phone] Attending Physician: KRise Patience[[phone] Estimated length of stay: past midnight tomorrow  Certification:: I certify this patient will need inpatient services for at least 2 midnights          B Medical/Surgery History Past Medical History:  Diagnosis  Date   Anxiety    Paranoid schizophrenia ([place]    PTSD (post-traumatic stress disorder) Paranoid   Past Surgical History:  Procedure Laterality Date   banding procedure for morbid obesity     I & D EXTREMITY Right 02/27/2014   Procedure: IRRIGATION AND DEBRIDEMENT FOREARM AND REPAIR OF 30cm LACERATION;  Surgeon: JJohnny Bridge MD;  [facility] [facility]  Service: Orthopedics;  Laterality: Right;  Anesthesia Regional with MAC     A IV Location/Drains/Wounds Patient Lines/Drains/Airways Status     Active Line/Drains/Airways     Name Placement date Placement time Site Days   Peripheral IV 07/26/21 18 G Left;Posterior Forearm 07/26/21  2318  Forearm  1   Peripheral IV 07/26/21 20 G Right Antecubital 07/26/21  2325  Antecubital  1   Peripheral IV 07/26/21 20 G Right Forearm 07/26/21  2353  Forearm  1   Urethral Catheter Erica RN Temperature probe 16 Fr. 07/27/21  0032  Temperature probe  less than 1   Pressure Injury 05/30/21 Elbow Left;Posterior Stage 2 -  Partial thickness loss of dermis presenting as a shallow open injury with a red, pink wound bed without slough. 05/30/21  1935  -- 58   Pressure Injury 05/30/21 Heel Left Deep Tissue Pressure Injury - Purple or maroon localized area of discolored intact skin or blood-filled blister due to damage of underlying soft tissue from pressure and/or shear. 05/30/21  1935  -- 58   Pressure Injury 05/30/21 Back Right;Mid Stage 2 -  Partial thickness loss of dermis presenting as a shallow open injury  with a red, pink wound bed without slough. 05/30/21  1936  -- 58   Pressure Injury 05/30/21 Back Lateral;Right Stage 2 -  Partial thickness loss of dermis presenting as a shallow open injury with a red, pink wound bed without slough. 05/30/21  1937  -- 58   Pressure Injury 05/30/21 Ankle Left;Lateral Stage 3 -  Full thickness tissue loss. Subcutaneous fat may be visible but bone, tendon or muscle are NOT exposed. 05/30/21  1938  -- 58   Pressure Injury  05/30/21 Ankle Right;Medial Stage 2 -  Partial thickness loss of dermis presenting as a shallow open injury with a red, pink wound bed without slough. 05/30/21  1939  -- 58   Pressure Injury 05/30/21 Hip Left Stage 4 - Full thickness tissue loss with exposed bone, tendon or muscle. 05/30/21  1944  -- 58   Wound / Incision (Open or Dehisced) 02/02/21 Non-pressure wound Elbow Left;Posterior Pustular Cellulitis 02/02/21  0330  Elbow  175   Wound / Incision (Open or Dehisced) 02/02/21 (MASD) Moisture Associated Skin Damage Buttocks Medial 02/02/21  0330  Buttocks  175   Wound / Incision (Open or Dehisced) 02/02/21 Non-pressure wound Knee Anterior;Right Pustular Wound on Right knee 02/02/21  0330  Knee  175   Wound / Incision (Open or Dehisced) 02/02/21 Arm Anterior;Distal;Lower;Right Pustular wound right wrist 02/02/21  0330  Arm  175   Wound / Incision (Open or Dehisced) 05/30/21 (MASD) Moisture Associated Skin Damage Groin Right;Left 05/30/21  1938  Groin  58            Intake/Output Last 24 hours  Intake/Output Summary (Last 24 hours) at 07/27/2021 1345 Last data filed at 07/27/2021 1341 Gross per 24 hour  Intake 1309.11 ml  Output 1000 ml  Net 309.11 ml    Labs/Imaging Results for orders placed or performed during the hospital encounter of 07/26/21 (from the past 48 hour(s))  Resp Panel by RT-PCR (Flu A&B, Covid) Nasopharyngeal Swab     Status: None   Collection Time: 07/26/21 10:58 PM   Specimen: Nasopharyngeal Swab; Nasopharyngeal(NP) swabs in vial transport medium  Result Value Ref Range   SARS Coronavirus 2 by RT PCR NEGATIVE NEGATIVE    Comment: (NOTE) SARS-CoV-2 target nucleic acids are NOT DETECTED.  The SARS-CoV-2 RNA is generally detectable in upper respiratory specimens during the acute phase of infection. The lowest concentration of SARS-CoV-2 viral copies this assay can detect is 138 copies/mL. A negative result does not preclude SARS-Cov-2 infection and should not  be used as the sole basis for treatment or other patient management decisions. A negative result may occur with  improper specimen collection/handling, submission of specimen other than nasopharyngeal swab, presence of viral mutation(s) within the areas targeted by this assay, and inadequate number of viral copies(<138 copies/mL). A negative result must be combined with clinical observations, patient history, and epidemiological information. The expected result is Negative.  Fact Sheet for Patients:  EntrepreneurPulse.com.au  Fact Sheet for Healthcare Providers:  IncredibleEmployment.be  This test is no t yet approved or cleared by the [place] FDA and  has been authorized for detection and/or diagnosis of SARS-CoV-2 by FDA under an Emergency Use Authorization (EUA). This EUA will remain  in effect (meaning this test can be used) for the duration of the COVID-19 declaration under Section 564(b)(1) of the Act, 21 U.S.C.section 360bbb-3(b)(1), unless the authorization is terminated  or revoked sooner.       Influenza A by PCR NEGATIVE NEGATIVE  Influenza B by PCR NEGATIVE NEGATIVE    Comment: (NOTE) The Xpert Xpress SARS-CoV-2/FLU/RSV plus assay is intended as an aid in the diagnosis of influenza from Nasopharyngeal swab specimens and should not be used as a sole basis for treatment. Nasal washings and aspirates are unacceptable for Xpert Xpress SARS-CoV-2/FLU/RSV testing.  Fact Sheet for Patients: EntrepreneurPulse.com.au  Fact Sheet for Healthcare Providers: IncredibleEmployment.be  This test is not yet approved or cleared by the [place] FDA and has been authorized for detection and/or diagnosis of SARS-CoV-2 by FDA under an Emergency Use Authorization (EUA). This EUA will remain in effect (meaning this test can be used) for the duration of the COVID-19 declaration under Section 564(b)(1) of  the Act, 21 U.S.C. section 360bbb-3(b)(1), unless the authorization is terminated or revoked.  Performed at [facility], [place] [address], [place], [place] [address]   Lactic acid, plasma     Status: Abnormal   Collection Time: 07/26/21 10:58 PM  Result Value Ref Range   Lactic Acid, Venous 2.0 (HH) 0.5 - 1.9 mmol/L    Comment: CRITICAL RESULT CALLED TO, READ BACK BY AND VERIFIED WITH: Janalyn Harder RN 07/27/21 0008 Wiliam Ke Performed at [facility], [address] [place], [place] [address]   CBC WITH DIFFERENTIAL     Status: Abnormal   Collection Time: 07/26/21 10:58 PM  Result Value Ref Range   WBC 9.7 4.0 - 10.5 K/uL   RBC 1.84 (L) 4.22 - 5.81 MIL/uL   Hemoglobin 4.8 (LL) 13.0 - 17.0 g/dL    Comment: REPEATED TO VERIFY THIS CRITICAL RESULT HAS VERIFIED AND BEEN CALLED TO ERICA SULLIVAN,RN BY ZELDA BEECH ON 11 09 2022 AT 2340, AND HAS BEEN READ BACK.     HCT 16.4 (L) 39.0 - 52.0 %   MCV 89.1 80.0 - 100.0 fL   MCH 26.1 26.0 - 34.0 pg   MCHC 29.3 (L) 30.0 - 36.0 g/dL   RDW 15.7 (H) 11.5 - 15.5 %   Platelets 359 150 - 400 K/uL   nRBC 0.0 0.0 - 0.2 %   Neutrophils Relative % 78 %   Neutro Abs 7.6 1.7 - 7.7 K/uL   Lymphocytes Relative 12 %   Lymphs Abs 1.2 0.7 - 4.0 K/uL   Monocytes Relative 7 %   Monocytes Absolute 0.7 0.1 - 1.0 K/uL   Eosinophils Relative 2 %   Eosinophils Absolute 0.2 0.0 - 0.5 K/uL   Basophils Relative 0 %   Basophils Absolute 0.0 0.0 - 0.1 K/uL   Immature Granulocytes 1 %   Abs Immature Granulocytes 0.07 0.00 - 0.07 K/uL    Comment: Performed at [facility] [address], [place], [place] [address]  Urinalysis, Routine w reflex microscopic     Status: Abnormal   Collection Time: 07/26/21 10:58 PM  Result Value Ref Range   Color, Urine YELLOW YELLOW   APPearance HAZY (A) CLEAR   Specific Gravity, Urine 1.003 (L) 1.005 - 1.030   pH 8.0 5.0 - 8.0   Glucose, UA NEGATIVE NEGATIVE mg/dL   Hgb urine dipstick SMALL (A) NEGATIVE    Bilirubin Urine NEGATIVE NEGATIVE   Ketones, ur NEGATIVE NEGATIVE mg/dL   Protein, ur NEGATIVE NEGATIVE mg/dL   Nitrite NEGATIVE NEGATIVE   Leukocytes,Ua LARGE (A) NEGATIVE   RBC / HPF 0-5 0 - 5 RBC/hpf   WBC, UA >50 (H) 0 - 5 WBC/hpf   Bacteria, UA FEW (A) NONE SEEN   Squamous Epithelial /  LPF 0-5 0 - 5   WBC Clumps PRESENT     Comment: Performed at [facility], [place] [address] [place], [place] [address]  ABO/Rh     Status: None   Collection Time: 07/26/21 10:58 PM  Result Value Ref Range   ABO/RH(D)      A POS Performed at [facility] [address] [place], [place] [address]   Type and screen [place]     Status: None (Preliminary result)   Collection Time: 07/26/21 11:54 PM  Result Value Ref Range   ABO/RH(D) A POS    Antibody Screen NEG    Sample Expiration 07/29/2021,2359    Unit Number [id number]    Blood Component Type RED CELLS,LR    Unit division 00    Status of Unit ISSUED    Transfusion Status OK TO TRANSFUSE    Crossmatch Result Compatible    Unit Number [id number]    Blood Component Type RED CELLS,LR    Unit division 00    Status of Unit ISSUED    Transfusion Status OK TO TRANSFUSE    Crossmatch Result Compatible    Unit Number [id number]    Blood Component Type RED CELLS,LR    Unit division 00    Status of Unit ISSUED    Transfusion Status OK TO TRANSFUSE    Crossmatch Result      Compatible Performed at [facility], [place] [address], [place], [place] [address]   Prepare RBC (crossmatch)     Status: None   Collection Time: 07/27/21 12:03 AM  Result Value Ref Range   Order Confirmation      ORDER PROCESSED BY BLOOD BANK Performed at [facility], [place] [address] [place], [place] [address]   Troponin I (High Sensitivity)     Status: None   Collection Time: 07/27/21 12:55 AM  Result Value Ref Range   Troponin I (High Sensitivity) 4 <18 ng/L    Comment: (NOTE) Elevated high sensitivity  troponin I (hsTnI) values and significant  changes across serial measurements may suggest ACS but many other  chronic and acute conditions are known to elevate hsTnI results.  Refer to the "Links" section for chest pain algorithms and additional  guidance. Performed at [facility], [place] [address] [place], [place] [address]   Lactic acid, plasma     Status: None   Collection Time: 07/27/21 12:58 AM  Result Value Ref Range   Lactic Acid, Venous 1.2 0.5 - 1.9 mmol/L    Comment: Performed at [facility] [address], [place], [place] [address]  Comprehensive metabolic panel     Status: Abnormal   Collection Time: 07/27/21  2:36 AM  Result Value Ref Range   Sodium 131 (L) 135 - 145 mmol/L   Potassium 3.7 3.5 - 5.1 mmol/L   Chloride 100 98 - 111 mmol/L   CO2 21 (L) 22 - 32 mmol/L   Glucose, Bld 117 (H) 70 - 99 mg/dL    Comment: Glucose reference range applies only to samples taken after fasting for at least 8 hours.   BUN 8 6 - 20 mg/dL   Creatinine, Ser 0.76 0.61 - 1.24 mg/dL   Calcium 8.0 (L) 8.9 - 10.3 mg/dL   Total Protein 6.4 (L) 6.5 - 8.1 g/dL   Albumin 2.2 (L) 3.5 - 5.0 g/dL   AST 16 15 - 41 U/L   ALT 8 0 -  44 U/L   Alkaline Phosphatase 60 38 - 126 U/L   Total Bilirubin 0.3 0.3 - 1.2 mg/dL   GFR, Estimated >60 >60 mL/min    Comment: (NOTE) Calculated using the CKD-EPI Creatinine Equation (2021)    Anion gap 10 5 - 15    Comment: Performed at [facility] [address] [place], [place] [address]  CBC with Differential     Status: Abnormal   Collection Time: 07/27/21 12:30 PM  Result Value Ref Range   WBC 5.6 4.0 - 10.5 K/uL   RBC 3.71 (L) 4.22 - 5.81 MIL/uL   Hemoglobin 10.0 (L) 13.0 - 17.0 g/dL    Comment: REPEATED TO VERIFY POST TRANSFUSION SPECIMEN    HCT 31.1 (L) 39.0 - 52.0 %   MCV 83.8 80.0 - 100.0 fL   MCH 27.0 26.0 - 34.0 pg   MCHC 32.2 30.0 - 36.0 g/dL   RDW 15.2 11.5 - 15.5 %   Platelets 321 150 - 400 K/uL   nRBC 0.0 0.0 - 0.2 %    Neutrophils Relative % 71 %   Neutro Abs 4.0 1.7 - 7.7 K/uL   Lymphocytes Relative 17 %   Lymphs Abs 1.0 0.7 - 4.0 K/uL   Monocytes Relative 6 %   Monocytes Absolute 0.3 0.1 - 1.0 K/uL   Eosinophils Relative 5 %   Eosinophils Absolute 0.3 0.0 - 0.5 K/uL   Basophils Relative 0 %   Basophils Absolute 0.0 0.0 - 0.1 K/uL   Immature Granulocytes 1 %   Abs Immature Granulocytes 0.04 0.00 - 0.07 K/uL    Comment: Performed at [facility] [address] [place], [place] [address]  CBG monitoring, ED     Status: Abnormal   Collection Time: 07/27/21 12:37 PM  Result Value Ref Range   Glucose-Capillary 129 (H) 70 - 99 mg/dL    Comment: Glucose reference range applies only to samples taken after fasting for at least 8 hours.   DG Ankle 2 Views Left  Result Date: 07/27/2021 CLINICAL DATA:  Pain EXAM: LEFT ANKLE - 2 VIEW COMPARISON:  None. FINDINGS: There is no evidence of acute fracture. 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 There is osteopenia in the foot likely from disuse. IMPRESSION: No acute osseous abnormality. Electronically Signed   By: Maurine Simmering M.D.   On: 07/27/2021 08:06   DG Ankle 2 Views Right  Result Date: 07/27/2021 CLINICAL DATA:  Pain EXAM: RIGHT ANKLE - 2 VIEW COMPARISON:  None. FINDINGS: There is no evidence of acute fracture. The foot is plantar flexed. Osteopenia in the foot. IMPRESSION: No acute osseous abnormality. Electronically Signed   By: Maurine Simmering M.D.   On: 07/27/2021 08:07   CT HEAD WO CONTRAST (5MM)  Result Date: 07/27/2021 CLINICAL DATA:  Mental status change with unknown cause. EXAM: CT HEAD WITHOUT CONTRAST TECHNIQUE: Contiguous axial images were obtained from the base of the skull through the vertex without intravenous contrast. COMPARISON:  None. FINDINGS: Brain: No evidence of acute infarction, hemorrhage, hydrocephalus, extra-axial collection or mass lesion/mass effect. Cerebral volume loss for age, without specific pattern. Vascular: No  hyperdense vessel or unexpected calcification. Skull: Normal. Negative for fracture or focal lesion. Sinuses/Orbits: No acute finding. IMPRESSION: Stable head CT.  No acute finding. Electronically Signed   By: Jorje Guild M.D.   On: 07/27/2021 04:12   CT PELVIS W CONTRAST  Result Date: 07/27/2021 CLINICAL DATA:  Abscess, anal or rectal. EXAM: CT PELVIS WITH CONTRAST TECHNIQUE:  Multidetector CT imaging of the pelvis was performed using the standard protocol following the bolus administration of intravenous contrast. CONTRAST:  12m OMNIPAQUE IOHEXOL 300 MG/ML  SOLN COMPARISON:  05/30/2021. FINDINGS: Urinary Tract: A Foley catheter is present in the urinary bladder. There is mild thickening of the bladder wall which may be in part due to incomplete distension. Bowel: No bowel obstruction, free air or pneumatosis. A moderate amount of retained stool is present in the sigmoid colon and rectum. No perirectal or or anal abscess is identified, however examination is limited due to the lack of IV contrast. Vascular/Lymphatic: No pathologically enlarged lymph nodes. No significant vascular abnormality seen. Reproductive:  The prostate gland is normal in size Other: A trace amount of free fluid is noted anterior to the rectum. Musculoskeletal: There is a large left medial gluteal ulcer extending into the posterior soft tissues of the upper posterior left thigh. There is bony destruction of the ischial tuberosity on the left with multiple bony fragments in the soft tissues, new from the previous exam. There is asymmetric enlargement of the obturator internus muscle and gluteal muscles on the left, possible myositis. Edema is noted in the adductor muscles in the medial proximal left thigh. IMPRESSION: 1. No definite evidence of perirectal abscess. 2. Large open ulceration in the medial gluteal region on the left. There is new bony destruction of the ischial tuberosity on the left, suggesting osteomyelitis. Enlargement  of the gluteal muscles and obturator internus is noted on the left, possible myositis. MRI may be beneficial for further evaluation. 3. Foley catheter in the urinary bladder. The bladder wall appears mildly thickened which may be in part due to incomplete distension. Correlate clinically to exclude infectious or inflammatory cystitis. Electronically Signed   By: LBrett FairyM.D.   On: 07/27/2021 04:30   DG Chest Port 1 View  Result Date: 07/26/2021 CLINICAL DATA:  Possible sepsis. EXAM: PORTABLE CHEST 1 VIEW COMPARISON:  02/01/2021. FINDINGS: The heart size and mediastinal contours are within normal limits. Both lungs are clear. Anterior and posterior cervical spinal fusion hardware is noted. IMPRESSION: No acute cardiopulmonary process. Electronically Signed   By: LBrett FairyM.D.   On: 07/26/2021 23:29    Pending Labs Unresulted Labs (From admission, onward)     Start     Ordered   07/26/21 2330  [place] Once,   R        07/26/21 2330   07/26/21 2330  APTT  Once,   R        07/26/21 2330   07/26/21 2259  Rapid urine drug screen (hospital performed)  ONCE - STAT,   STAT        07/26/21 2259   07/26/21 2258  Blood Culture (routine x 2)  (Septic presentation on arrival (screening labs, nursing and treatment orders for obvious sepsis))  BLOOD CULTURE X 2,   STAT      07/26/21 2258   07/26/21 2258  Urine Culture  (Septic presentation on arrival (screening labs, nursing and treatment orders for obvious sepsis))  ONCE - STAT,   STAT       Question:  Indication  Answer:  Sepsis   07/26/21 2258            Vitals/Pain Today's Vitals   07/27/21 0945 07/27/21 1030 07/27/21 1315 07/27/21 1330  BP: 130/77 127/90 (!) 136/93 138/85  Pulse: 72 67 65 (!) 59  Resp: (!) _0 Temp: 98.5 F (36.9 C) 98.7  F (37.1 C) (!) 97.4 F (36.3 C) (!) 97.4 F (36.3 C)  TempSrc:      SpO2: 99% 99% 100% 100%  Weight:      Height:      PainSc:        Isolation Precautions No active  isolations  Medications Medications  0.9 %  sodium chloride infusion (10 mL/hr Intravenous Not Given 07/27/21 0820)  lactated ringers infusion ( Intravenous New Bag/Given 07/27/21 0659)  docusate sodium (COLACE) capsule 100 mg (100 mg Oral Patient Refused/Not Given 07/27/21 0914)  thiamine tablet 100 mg (100 mg Oral Given 07/27/21 0914)    Or  thiamine (B-1) injection 100 mg ( Intravenous See Alternative 52/08/02 2336)  folic acid (FOLVITE) tablet 1 mg (1 mg Oral Given 07/27/21 0914)  multivitamin with minerals tablet 1 tablet (1 tablet Oral Given 07/27/21 0914)  acetaminophen (TYLENOL) tablet 650 mg (has no administration in time range)    Or  acetaminophen (TYLENOL) suppository 650 mg (has no administration in time range)  metroNIDAZOLE (FLAGYL) IVPB 500 mg (0 mg Intravenous Stopped 07/27/21 1341)  ceFEPIme (MAXIPIME) 2 g in sodium chloride 0.9 % 100 mL IVPB (2 g Intravenous New Bag/Given 07/27/21 1342)  vancomycin (VANCOREADY) IVPB 1000 mg/200 mL (0 mg Intravenous Stopped 07/27/21 0914)  lactated ringers bolus 1,000 mL (0 mLs Intravenous Stopped 07/27/21 0121)    And  lactated ringers bolus 1,000 mL (0 mLs Intravenous Stopped 07/27/21 0121)    And  lactated ringers bolus 500 mL (0 mLs Intravenous Stopped 07/27/21 0155)  ceFEPIme (MAXIPIME) 2 g in sodium chloride 0.9 % 100 mL IVPB (0 g Intravenous Stopped 07/27/21 0008)  metroNIDAZOLE (FLAGYL) IVPB 500 mg (0 mg Intravenous Stopped 07/27/21 0037)  vancomycin (VANCOREADY) IVPB 1500 mg/300 mL (0 mg Intravenous Stopped 07/27/21 0255)  iohexol (OMNIPAQUE) 300 MG/ML solution 75 mL (75 mLs Intravenous Contrast Given 07/27/21 0359)    Mobility non-ambulatory     Focused Assessments Neuro Assessment Handoff:  Swallow screen pass? Yes  Cardiac Rhythm: Normal sinus rhythm       Neuro Assessment: Exceptions to WDL Neuro Checks:      Last Documented NIHSS Modified Score:   Has TPA been given? No If patient is a Neuro Trauma and  patient is going to OR before floor call report to [facility] nurse: [phone] or [phone]   R Recommendations: See Admitting Provider Note  Report given to:   Additional Notes:  see wounds assessments

## 2021-07-27 NOTE — Consult Note (Addendum)
WOC Nurse Consult Note: Patient receiving care in [facility] ED020 Sleepy and very lethargic. Was able to answer my questions but would just go back to sleep Reason for Consult: Multiple wounds Wound type: Healing wounds on the left elbow and forearm, left lateral malleolus and right medial malleolus. Per last admission in September, he had right flank and back wounds that were not seen today, however he was laying on his right side and would not turn over. The left side seemed to be healed from this past wound. No wounds noted on both heels. 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 Last consult from general surgery was 05/31/21 and no debridement was warranted at that time. This wound is 95% clean and pink but down to the bone. Agree that debridement is still not needed at this time. My recommendation would be to place a moistened saline gauze into the buttock wound, cover with dry gauze and secure with an ABD and Medipore tape. Change twice daily.  Continue small foam dressings to the healing sites for protection.  Pressure Injury POA: Yes Measurement: Stage 4 sacral wound measures 9 x 7.5 x 5.7 Dressing procedure/placement/frequency: See above  Monitor the wound area(s) for worsening of condition such as: Signs/symptoms of infection, increase in size, development of or worsening of odor, development of pain, or increased pain at the affected locations.   Notify the medical team if any of these develop.  Thank you for the consult. WOC nurse will not follow at this time.   Please re-consult the WOC team if needed.  Renaldo Reel Katrinka Blazing, MSN, RN, CMSRN, Angus Seller, [facility] Wound Treatment Associate Pager [phone]

## 2021-07-27 NOTE — Sepsis Progress Note (Signed)
Following per sepsis protocol   
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
## 2021-07-28 ENCOUNTER — Inpatient Hospital Stay (HOSPITAL_COMMUNITY): Payer: No Typology Code available for payment source

## 2021-07-28 DIAGNOSIS — D649 Anemia, unspecified: Secondary | ICD-10-CM | POA: Diagnosis not present

## 2021-07-28 DIAGNOSIS — R7881 Bacteremia: Secondary | ICD-10-CM

## 2021-07-28 LAB — COMPREHENSIVE METABOLIC PANEL
ALT: 9 U/L (ref 0–44)
AST: 10 U/L — ABNORMAL LOW (ref 15–41)
Albumin: 2 g/dL — ABNORMAL LOW (ref 3.5–5.0)
Alkaline Phosphatase: 57 U/L (ref 38–126)
Anion gap: 6 (ref 5–15)
BUN: <5 mg/dL — ABNORMAL LOW (ref 6–20)
CO2: 26 mmol/L (ref 22–32)
Calcium: 8.3 mg/dL — ABNORMAL LOW (ref 8.9–10.3)
Chloride: 104 mmol/L (ref 98–111)
Creatinine, Ser: 0.59 mg/dL — ABNORMAL LOW (ref 0.61–1.24)
GFR, Estimated: >60 mL/min (ref >60)
Glucose, Bld: 85 mg/dL (ref 70–99)
Potassium: 4.1 mmol/L (ref 3.5–5.1)
Sodium: 136 mmol/L (ref 135–145)
Total Bilirubin: 0.4 mg/dL (ref 0.3–1.2)
Total Protein: 6.2 g/dL — ABNORMAL LOW (ref 6.5–8.1)

## 2021-07-28 LAB — BLOOD CULTURE ID PANEL (REFLEXED) - BCID2

## 2021-07-28 LAB — ECHOCARDIOGRAM COMPLETE
AR max vel: 3.91 cm2
AV Peak grad: 6.6 mmHg
Ao pk vel: 1.28 m/s
Area-P 1/2: 3.58 cm2
Calc EF: 57.6 %
Height: 72 in
S' Lateral: 3.5 cm
Single Plane A2C EF: 57.8 %
Single Plane A4C EF: 58 %
Weight: 2645.52 oz

## 2021-07-28 LAB — TYPE AND SCREEN
ABO/RH(D): A POS
Antibody Screen: NEGATIVE
Unit division: 0
Unit division: 0
Unit division: 0

## 2021-07-28 LAB — BPAM RBC
Blood Product Expiration Date: 202211252359
Blood Product Expiration Date: 202211252359
Blood Product Expiration Date: 202211272359
ISSUE DATE / TIME: 202211100108
ISSUE DATE / TIME: 202211100407
ISSUE DATE / TIME: 202211100805
Unit Type and Rh: 6200
Unit Type and Rh: 6200
Unit Type and Rh: 6200

## 2021-07-28 LAB — CBC WITH DIFFERENTIAL/PLATELET
Abs Immature Granulocytes: 0.04 10*3/uL (ref 0.00–0.07)
Basophils Absolute: 0 10*3/uL (ref 0.0–0.1)
Basophils Relative: 1 %
Eosinophils Absolute: 0.4 10*3/uL (ref 0.0–0.5)
Eosinophils Relative: 8 %
HCT: 33.8 % — ABNORMAL LOW (ref 39.0–52.0)
Hemoglobin: 10.6 g/dL — ABNORMAL LOW (ref 13.0–17.0)
Immature Granulocytes: 1 %
Lymphocytes Relative: 22 %
Lymphs Abs: 1.2 10*3/uL (ref 0.7–4.0)
MCH: 26.3 pg (ref 26.0–34.0)
MCHC: 31.4 g/dL (ref 30.0–36.0)
MCV: 83.9 fL (ref 80.0–100.0)
Monocytes Absolute: 0.4 10*3/uL (ref 0.1–1.0)
Monocytes Relative: 7 %
Neutro Abs: 3.3 10*3/uL (ref 1.7–7.7)
Neutrophils Relative %: 61 %
Platelets: 341 10*3/uL (ref 150–400)
RBC: 4.03 MIL/uL — ABNORMAL LOW (ref 4.22–5.81)
RDW: 15.5 % (ref 11.5–15.5)
WBC: 5.3 10*3/uL (ref 4.0–10.5)
nRBC: 0 % (ref 0.0–0.2)

## 2021-07-28 LAB — GLUCOSE, CAPILLARY
Glucose-Capillary: 120 mg/dL — ABNORMAL HIGH (ref 70–99)
Glucose-Capillary: 99 mg/dL (ref 70–99)

## 2021-07-28 MED ORDER — METRONIDAZOLE 500 MG PO TABS
500.0000 mg | ORAL_TABLET | Freq: Two times a day (BID) | ORAL | Status: DC
  Administered 2021-07-28 – 2021-07-29 (×3): 500 mg via ORAL
  Filled 2021-07-28 (×3): qty 1

## 2021-07-28 MED ORDER — LORAZEPAM 2 MG/ML IJ SOLN
1.0000 mg | Freq: Once | INTRAMUSCULAR | Status: AC
  Administered 2021-07-28: 1 mg via INTRAVENOUS
  Filled 2021-07-28: qty 1

## 2021-07-28 MED ORDER — VANCOMYCIN HCL 1250 MG/250ML IV SOLN
1250.0000 mg | Freq: Two times a day (BID) | INTRAVENOUS | Status: DC
  Administered 2021-07-28 – 2021-07-30 (×5): 1250 mg via INTRAVENOUS
  Filled 2021-07-28 (×5): qty 250

## 2021-07-28 NOTE — Progress Notes (Signed)
Pharmacy Antibiotic Note  Steve [name] is a 30 y.o. male admitted on 07/26/2021 with MRSA bacteremia and possible sacral osteomyelitis.  Pharmacy has been consulted for Vancomycin/Cefepime dosing. WBC WNL. Renal function okay but patient quadriplegic- Will round SCr to 1.1.   Plan: Adjust Vancomycin to 1250 mg every 12 hours  - Predicted AUC 497  Cefepime 2g IV q8h Monitor renal function and levels as appropriate  F/U MRI and bacteremia work-up   Height: 6' (182.9 cm) Weight: 75 kg (165 lb 5.5 oz) IBW/kg (Calculated) : 77.6  Temp (24hrs), Avg:97.8 F (36.6 C), Min:97.1 F (36.2 C), Max:98.6 F (37 C)  Recent Labs  Lab 07/26/21 2258 07/27/21 0058 07/27/21 0236 07/27/21 1230 07/28/21 0230  WBC 9.7  --   --  5.6 5.3  CREATININE  --   --  0.76  --  0.59*  LATICACIDVEN 2.0* 1.2  --   --   --      Estimated Creatinine Clearance: 141.9 mL/min (A) (by C-G formula based on SCr of 0.59 mg/dL (L)).    Allergies  Allergen Reactions   Caffeine Anaphylaxis   Caffeine Anaphylaxis   Chocolate Anaphylaxis   Chocolate Anaphylaxis   Tuberculin     Other reaction(s): Eruption of skin   Sulfa Antibiotics Rash   Sulfa Antibiotics Rash   Sharin Mons, PharmD, BCPS, BCIDP Infectious Diseases Clinical Pharmacist Phone: [phone] 07/28/2021

## 2021-07-28 NOTE — Progress Notes (Signed)
PROGRESS NOTE  Steve [name] TKW:[id number] DOB: Jan 11, 1991 DOA: 07/26/2021 PCP: [facility]  Brief History   The patient is a 30 yr old man who presented to [facility] ED with complaints of confusion and bleeding from his left second toe which had an ulcer. The patient had been using methamphetamine.    The patient has a medical history significant for sacral decubitus ulcer with osteomyelitis diagnosed in 05/2021, multiple decubitus ulcers including a large left buttock ulcer. He is a quadriplegic following a C spine injury. He also has anxiety, paranoid schizophrenia, and PTSD.    In the ED the patient was found to have a hemoglobin of 4.8 upon presentation. He received 3 units PRBC's in the ED. He was found to be bleeding not only from the toe, but also his chronic sacral wound. The patient was found to have a lactic acid of 2.0. WBC was 9.7. Hemoglobin following transfusion was 10.0.  Yesterday the patient refused MRI. We will try again with Ativan. He has had 1/2 BC grow out MRSA. TTE has been ordered, ID has been consulted.  Consultants  Infectious disease Wound care  Procedures  None  Antibiotics   Anti-infectives (From admission, onward)    Start     Dose/Rate Route Frequency Ordered Stop   07/28/21 2034  vancomycin (VANCOREADY) IVPB 1250 mg/250 mL        1,250 mg 166.7 mL/hr over 90 Minutes Intravenous Every 12 hours 07/28/21 1122     07/28/21 1000  metroNIDAZOLE (FLAGYL) tablet 500 mg        500 mg Oral Every 12 hours 07/28/21 0814     07/27/21 1200  metroNIDAZOLE (FLAGYL) IVPB 500 mg  Status:  Discontinued        500 mg 100 mL/hr over 60 Minutes Intravenous Every 12 hours 07/27/21 0628 07/28/21 0814   07/27/21 0700  vancomycin (VANCOREADY) IVPB 1000 mg/200 mL  Status:  Discontinued        1,000 mg 200 mL/hr over 60 Minutes Intravenous Every 8 hours 07/27/21 0637 07/28/21 1122   07/27/21 0645  ceFEPIme (MAXIPIME) 2 g in sodium chloride 0.9 % 100 mL IVPB         2 g 200 mL/hr over 30 Minutes Intravenous Every 8 hours 07/27/21 0637     07/27/21 0630  ceFEPIme (MAXIPIME) 2 g in sodium chloride 0.9 % 100 mL IVPB  Status:  Discontinued        2 g 200 mL/hr over 30 Minutes Intravenous  Once 07/27/21 0628 07/27/21 0632   07/27/21 0630  vancomycin (VANCOREADY) IVPB 1000 mg/200 mL  Status:  Discontinued        1,000 mg 200 mL/hr over 60 Minutes Intravenous  Once 07/27/21 0628 07/27/21 0632   07/26/21 2300  ceFEPIme (MAXIPIME) 2 g in sodium chloride 0.9 % 100 mL IVPB        2 g 200 mL/hr over 30 Minutes Intravenous  Once 07/26/21 2258 07/27/21 0008   07/26/21 2300  metroNIDAZOLE (FLAGYL) IVPB 500 mg        500 mg 100 mL/hr over 60 Minutes Intravenous  Once 07/26/21 2258 07/27/21 0037   07/26/21 2300  vancomycin (VANCOREADY) IVPB 1500 mg/300 mL        20 mg/kg  75 kg 150 mL/hr over 120 Minutes Intravenous  Once 07/26/21 2258 07/27/21 0255      Subjective  The patient is sleeping soundly. No acute distress.   Objective   Vitals:  Vitals:  07/28/21 0812 07/28/21 1302  BP: 118/71 128/83  Pulse: 65 69  Resp: 12 20  Temp: 98 F (36.7 C) (!) 97.1 F (36.2 C)  SpO2: 100% 97%    Exam:  Constitutional:  The patient is sleeping soundly. Not awakened. No acute distress. Respiratory:  No increased work of breathing. No wheezes, rales, or rhonchi No tactile fremitus Cardiovascular:  Regular rate and rhythm No murmurs, ectopy, or gallups. No lateral PMI. No thrills. Abdomen:  Abdomen is soft, non-tender, non-distended No hernias, masses, or organomegaly Normoactive bowel sounds.  Musculoskeletal:  No cyanosis, clubbing, or edema Skin:  No rashes, lesions, ulcers palpation of skin: no induration or nodules Neurologic:  Pt is unable to cooperate with exam. Psychiatric:  Pt is unable to cooperate with exam.   I have personally reviewed the following:   Today's Data  Vitals  Lab Data  CMP  Micro Data  1/2 Blood cultures + for  MRSA  Imaging  MRI pelvis pending  Cardiology Data  EKG Echocardiogram  Scheduled Meds:  Chlorhexidine Gluconate Cloth  6 each Topical Daily   docusate sodium  100 mg Oral BID   folic acid  1 mg Oral Daily   metroNIDAZOLE  500 mg Oral Q12H   multivitamin with minerals  1 tablet Oral Daily   thiamine  100 mg Oral Daily   Or   thiamine  100 mg Intravenous Daily   Continuous Infusions:  sodium chloride     ceFEPime (MAXIPIME) IV 2 g (07/28/21 1447)   vancomycin      Principal Problem:   Severe anemia Active Problems:   Acute blood loss anemia   Substance abuse ([facility])   Pressure injury of skin   Sacral osteomyelitis ([facility])   LOS: 1 day   A & P  Severe anemia likely from acute blood loss likely from the sacral wound for which patient is receiving 3 units of PRBC transfusion.  Follow CBC after transfusion.  Any further units of blood transfusion will require platelets and FFP.  At the time of my exam patient's sacral wound appears to have stopped bleeding.  We will also have to look into other source of bleeding. SIRS likely from anemia and possible infection of the sacral wound and also drug-related. MRSA bacteremia. 1/2 blood cultures have grown out MRSA. Infectious disease has been consulted. Pt is on cefepime and vancomycin. Sacral osteomyelitis. MRI is pending.   On empiric antibiotics.  Follow cultures.  We will need infectious disease input.  I have consulted wound team. Acute encephalopathy suspect likely drug related.  Mental status is improving after admitting to the ER.  CT head is unremarkable.  currently sleeping Polysubstance abuse patient has admitted to taking meth.  On CIWA at this time. Patient has multiple decubitus ulcers and there is also some swelling on the left and right ankle for which I have ordered x-rays. Wound care has been consulted. History of C-spine injury with quadriplegia.  I have seen and examined this patient myself. I have spent 34 minutes in  his evaluation and care.   Since patient has severe anemia with SIRS/sepsis with osteomyelitis will need close monitoring for any further worsening inpatient status.   DVT prophylaxis: SCDs.  Avoiding anticoagulation in the setting of severe anemia and blood loss. Code Status: Full code. Family Communication: We will need to discuss with patient's wife to get further history. Disposition Plan: To be determined. Consults called: Wound team. Admission status: Inpatient.   Drevon Plog, DO  [facility] Direct contact: see [email or web address]  7PM-7AM contact night coverage as above 07/28/2021, 4:49 PM  LOS: 1 day

## 2021-07-28 NOTE — Progress Notes (Signed)
PHARMACY - PHYSICIAN COMMUNICATION CRITICAL VALUE ALERT - BLOOD CULTURE IDENTIFICATION (BCID)  Steve [name] is an 30 y.o. male who presented to [facility] on 07/26/2021 with a chief complaint of confusion and bleeding from sacral ulcer.  Assessment: Dx with Sacral osteomyelitis, pending MRI. Pt has hx of Pseudomonas Putida and surface cx of Ecoli on 05/29/2021. DCed on ciprofloxacin, doxycycline and flagyl x14 days. Will need ID input.  Name of physician (or Provider) Contacted: A Swayze DO  Current antibiotics: Vancomycin 11/10>> Cefepime 11/10>> Metronidazole 11/10>> (11/17)  Changes to prescribed antibiotics recommended:  Patient is on recommended antibiotics - No changes needed  Results for orders placed or performed during the hospital encounter of 07/26/21  Blood Culture ID Panel (Reflexed) (Collected: 07/26/2021 11:15 PM)  Result Value Ref Range   Enterococcus faecalis NOT DETECTED NOT DETECTED   Enterococcus Faecium NOT DETECTED NOT DETECTED   Listeria monocytogenes NOT DETECTED NOT DETECTED   Staphylococcus species DETECTED (A) NOT DETECTED   Staphylococcus aureus (BCID) DETECTED (A) NOT DETECTED   Staphylococcus epidermidis NOT DETECTED NOT DETECTED   Staphylococcus lugdunensis NOT DETECTED NOT DETECTED   Streptococcus species NOT DETECTED NOT DETECTED   Streptococcus agalactiae NOT DETECTED NOT DETECTED   Streptococcus pneumoniae NOT DETECTED NOT DETECTED   Streptococcus pyogenes NOT DETECTED NOT DETECTED   A.calcoaceticus-baumannii NOT DETECTED NOT DETECTED   Bacteroides fragilis NOT DETECTED NOT DETECTED   Enterobacterales NOT DETECTED NOT DETECTED   Enterobacter cloacae complex NOT DETECTED NOT DETECTED   Escherichia coli NOT DETECTED NOT DETECTED   Klebsiella aerogenes NOT DETECTED NOT DETECTED   Klebsiella oxytoca NOT DETECTED NOT DETECTED   Klebsiella pneumoniae NOT DETECTED NOT DETECTED   Proteus species NOT DETECTED NOT DETECTED   Salmonella species NOT  DETECTED NOT DETECTED   Serratia marcescens NOT DETECTED NOT DETECTED   Haemophilus influenzae NOT DETECTED NOT DETECTED   Neisseria meningitidis NOT DETECTED NOT DETECTED   Pseudomonas aeruginosa NOT DETECTED NOT DETECTED   Stenotrophomonas maltophilia NOT DETECTED NOT DETECTED   Candida albicans NOT DETECTED NOT DETECTED   Candida auris NOT DETECTED NOT DETECTED   Candida glabrata NOT DETECTED NOT DETECTED   Candida krusei NOT DETECTED NOT DETECTED   Candida parapsilosis NOT DETECTED NOT DETECTED   Candida tropicalis NOT DETECTED NOT DETECTED   Cryptococcus neoformans/gattii NOT DETECTED NOT DETECTED   Meth resistant mecA/C and MREJ DETECTED (A) NOT DETECTED   Thank you, Rosanne Ashing, PharmD Candidate 07/28/2021  6:13 AM

## 2021-07-28 NOTE — Plan of Care (Signed)
  Problem: Activity: Goal: Risk for activity intolerance will decrease Outcome: Progressing   Problem: Nutrition: Goal: Adequate nutrition will be maintained Outcome: Progressing   Problem: Coping: Goal: Level of anxiety will decrease Outcome: Progressing   Problem: Elimination: Goal: Will not experience complications related to bowel motility Outcome: Progressing   Problem: Pain Managment: Goal: General experience of comfort will improve Outcome: Progressing   

## 2021-07-28 NOTE — Consult Note (Addendum)
[facility]                                                                                        Patient Identification: Patient Name: Steve [name] MRN: [id number] [place] Date: 07/26/2021 10:48 PM Today's Date: 07/28/2021 Reason for consult: MRSA bacteremia  Requesting provider: CHAMP autoconsult  Principal Problem:   Severe anemia Active Problems:   Acute blood loss anemia   Substance abuse (HCC)   Pressure injury of skin   Sacral osteomyelitis (HCC)   Antibiotics: Vancomycin 11/9-current                     Cefepime 11/9-current                     Metronidazole 11/9-current   Lines/Hardware: cervical fusion hardaware   Assessment MRSA bacteremia ( both sets ): Most likely source is  skin given multiple wounds   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 Wound care notes reviewed with similar findings.   Left Ischial tuberosity Osteomyelitis ( NEW bony destruction in CT )  Multiple healing wounds in the left elbow, Left forearm, Left Lateral and Rt medial malleolus  Acute anemia in the setting of bleeding wounds - s/p transfusion  Recent h/o Pseudomonas putida bacteremia Cervical fusion hardware Polysubstance abuse: HCV RNA and HIV negative in 06/01/21, Immune to Hep B   Recommendations  -Continue Vancomycin, pharmacy to dose for MRSA bacteremia -Repeat 2 sets of blood cx today  -Start with TTE, may need TEE  -Fu MRI pelvis ( he would like to get something to get rid of the loud noise). -The left gluteal ulcer seems to be chronic and non infected at this time. However, there is a NEW bony destruction in the left ischial tuberosity has been noted in CT pelvis concerning for osteomyelitis. I will recommend to consult surgery/IR for biopsy for cultures and path for pathological  confirmation of diagnosis as well as targeted therapy for osteomyelitis after MRI pelvis . If planned for biopsy, can hold cefepime and metronidazole for 48 hrs prior to biopsy unless septic. Continue cefepime and metronidazole for now  -Of note, he will need good nutrition, off loading of his wound as well as potential plans for wound coverage and diverting colostomy including IV abtx if healing of the wound is the goal.   -Monitor CBC, BMP -Discussed plan with Dr Benny Lennert   Dr Gale Journey covering this weekend.  Otherwise, I will follow up on Monday   Rest of the management as per the primary team. Please call with questions or concerns.  Thank you for the consult  Rosiland Oz, MD Infectious Disease Physician [facility] [address] [place], [place] [address] Phone: [phone]  Fax: [phone]  __________________________________________________________________________________________________________ HPI and Hospital Course: 30 Y O Male with PMH of Schizophrenia, PTSD, substance abuse,  c5 incomplete quadriplegia, self cath for chronic urinary retention, chronic left buttock ulcer with chronic osteomyelitis  brought from home by EMS  after they were called by a care giver for AMS. EMS reported patient was confused and disoriented upon their arrival. EMS also noted that patient had a buttock decubitus ulcer that was actively bleeding upon their arrival  which was packed. Was given haldol and versed by EMS for agitation. Per wife, he fell out of his wheel chair and then was trying to drag himself around on the ground initiating the bleeding  At ED, afebrile, no leukocytosis Work up remarkable for  CT pelvis:  Large open ulceration in the medial gluteal region on the left. There is new bony destruction of the ischial tuberosity on the left, suggesting osteomyelitis. Enlargement of the gluteal muscles and obturator internus is noted on the  left, possible myositis. MRI may be beneficial for further evaluation.  Blood cx 11/9 2/2 sets MRSA   Of note, patient was admitted in September with  left buttock ulcer drainage , seen by ID, no debridement or deep culture done Would cx growing E coli ( R to ampicillin, augmentin, cipro and bactrim). Blood cx positive for Pseudomonas putida. Patient was discharged on doxy, cipro and metronidazole for  2weeks.    ROS: limited, as patient would fall back to sleep after every questions  Past Medical History:  Diagnosis Date   Anxiety    Paranoid schizophrenia ([place])    PTSD (post-traumatic stress disorder) Paranoid   Past Surgical History:  Procedure Laterality Date   banding procedure for morbid obesity     I & D EXTREMITY Right 02/27/2014   Procedure: IRRIGATION AND DEBRIDEMENT FOREARM AND REPAIR OF 30cm LACERATION;  Surgeon: Johnny Bridge, MD;  Location: [facility];  Service: Orthopedics;  Laterality: Right;  Anesthesia Regional with MAC     Scheduled Meds:  Chlorhexidine Gluconate Cloth  6 each Topical Daily   docusate sodium  100 mg Oral BID   folic acid  1 mg Oral Daily   multivitamin with minerals  1 tablet Oral Daily   thiamine  100 mg Oral Daily   Or   thiamine  100 mg Intravenous Daily   Continuous Infusions:  sodium chloride     ceFEPime (MAXIPIME) IV 2 g (07/28/21 9179)   metronidazole 500 mg (07/27/21 2255)   vancomycin 1,000 mg (07/27/21 2254)   PRN Meds:.acetaminophen **OR** acetaminophen  Allergies  Allergen Reactions   Caffeine Anaphylaxis   Caffeine Anaphylaxis   Chocolate Anaphylaxis   Chocolate Anaphylaxis   Tuberculin     Other reaction(s): Eruption of skin   Sulfa Antibiotics Rash   Sulfa Antibiotics Rash   Social History   Socioeconomic History   Marital status: Married    Spouse name: Not on file   Number of children: Not on file   Years of education: Not on file   Highest education level: Not on file  Occupational History   Not on file   Tobacco Use   Smoking status: Some Days    Packs/day: 0.25    Types: Cigarettes   Smokeless tobacco: Never  Vaping Use   Vaping Use: Unknown  Substance and Sexual Activity   Alcohol use: Not Currently   Drug use: Yes    Types: Marijuana, Methamphetamines   Sexual activity: Not Currently  Other Topics Concern   Not on file  Social History Narrative   Not on file   Social Determinants of Health   Financial Resource Strain: Not on file  Food Insecurity: Not on file  Transportation Needs: Not on file  Physical Activity: Not  on file  Stress: Not on file  Social Connections: Not on file  Intimate Partner Violence: Not on file   Vitals BP (!) 104/55 (BP Location: Left Arm)   Pulse 75   Temp 98 F (36.7 C) (Oral)   Resp 19   Ht 6' (1.829 m)   Wt 75 kg   SpO2 100%   BMI 22.42 kg/m     Physical Exam Constitutional:  drowsy and would fall back to sleep immediately     Comments:   Cardiovascular:     Rate and Rhythm: Normal rate and regular rhythm.     Heart sounds:   Pulmonary:     Effort: Pulmonary effort is normal.     Comments:   Abdominal:     Palpations: Abdomen is soft.     Tenderness: Non tender and non distended   Musculoskeletal:        General: No swelling or tenderness. No back tenderness  Skin:    Comments: Multiple wounds in the left elbow, left forearm, left lateral malleolus, rt medial malleolus ( all healing per wound care notes)  Neurological:     General: incomplete quadriplegia   Psychiatric:        Mood and Affect: Mood normal.    Pertinent Microbiology Results for orders placed or performed during the hospital encounter of 07/26/21  Resp Panel by RT-PCR (Flu A&B, Covid) Nasopharyngeal Swab     Status: None   Collection Time: 07/26/21 10:58 PM   Specimen: Nasopharyngeal Swab; Nasopharyngeal(NP) swabs in vial transport medium  Result Value Ref Range Status   SARS Coronavirus 2 by RT PCR NEGATIVE NEGATIVE Final    Comment:  (NOTE) SARS-CoV-2 target nucleic acids are NOT DETECTED.  The SARS-CoV-2 RNA is generally detectable in upper respiratory specimens during the acute phase of infection. The lowest concentration of SARS-CoV-2 viral copies this assay can detect is 138 copies/mL. A negative result does not preclude SARS-Cov-2 infection and should not be used as the sole basis for treatment or other patient management decisions. A negative result may occur with  improper specimen collection/handling, submission of specimen other than nasopharyngeal swab, presence of viral mutation(s) within the areas targeted by this assay, and inadequate number of viral copies(<138 copies/mL). A negative result must be combined with clinical observations, patient history, and epidemiological information. The expected result is Negative.  Fact Sheet for Patients:  EntrepreneurPulse.com.au  Fact Sheet for Healthcare Providers:  IncredibleEmployment.be  This test is no t yet approved or cleared by the [place] FDA and  has been authorized for detection and/or diagnosis of SARS-CoV-2 by FDA under an Emergency Use Authorization (EUA). This EUA will remain  in effect (meaning this test can be used) for the duration of the COVID-19 declaration under Section 564(b)(1) of the Act, 21 U.S.C.section 360bbb-3(b)(1), unless the authorization is terminated  or revoked sooner.       Influenza A by PCR NEGATIVE NEGATIVE Final   Influenza B by PCR NEGATIVE NEGATIVE Final    Comment: (NOTE) The Xpert Xpress SARS-CoV-2/FLU/RSV plus assay is intended as an aid in the diagnosis of influenza from Nasopharyngeal swab specimens and should not be used as a sole basis for treatment. Nasal washings and aspirates are unacceptable for Xpert Xpress SARS-CoV-2/FLU/RSV testing.  Fact Sheet for Patients: EntrepreneurPulse.com.au  Fact Sheet for Healthcare  Providers: IncredibleEmployment.be  This test is not yet approved or cleared by the [place] FDA and has been authorized for detection and/or diagnosis of SARS-CoV-2 by  FDA under an Emergency Use Authorization (EUA). This EUA will remain in effect (meaning this test can be used) for the duration of the COVID-19 declaration under Section 564(b)(1) of the Act, 21 U.S.C. section 360bbb-3(b)(1), unless the authorization is terminated or revoked.  Performed at [facility], [place] [address] [place], [place] [address]   Urine Culture     Status: Abnormal   Collection Time: 07/26/21 10:58 PM   Specimen: In/Out Cath Urine  Result Value Ref Range Status   Specimen Description IN/OUT CATH URINE  Final   Special Requests   Final    NONE Performed at [facility], [place] [address], [place], [place] [address]    Culture MULTIPLE SPECIES PRESENT, SUGGEST RECOLLECTION (A)  Final   Report Status 07/27/2021 FINAL  Final  Blood Culture (routine x 2)     Status: None (Preliminary result)   Collection Time: 07/26/21 11:05 PM   Specimen: BLOOD  Result Value Ref Range Status   Specimen Description BLOOD RIGHT ANTECUBITAL  Final   Special Requests   Final    BOTTLES DRAWN AEROBIC AND ANAEROBIC Blood Culture adequate volume   Culture  Setup Time   Final    GRAM POSITIVE COCCI IN CLUSTERS AEROBIC BOTTLE ONLY CRITICAL VALUE NOTED.  VALUE IS CONSISTENT WITH PREVIOUSLY REPORTED AND CALLED VALUE. Performed at [facility], [place] [name], [place], [place] [address]    Culture PENDING  Incomplete   Report Status PENDING  Incomplete  Blood Culture (routine x 2)     Status: None (Preliminary result)   Collection Time: 07/26/21 11:15 PM   Specimen: BLOOD RIGHT WRIST  Result Value Ref Range Status   Specimen Description BLOOD RIGHT WRIST  Final   Special Requests   Final    BOTTLES DRAWN AEROBIC AND ANAEROBIC Blood Culture adequate volume   Culture  Setup Time    Final    GRAM POSITIVE COCCI IN CLUSTERS AEROBIC BOTTLE ONLY CRITICAL RESULT CALLED TO, READ BACK BY AND VERIFIED WITH: V BRYK,PHARMD_0  07/28/21 [place] Performed at [facility], [place] [address] [place], [place] [address]    Culture PENDING  Incomplete   Report Status PENDING  Incomplete  Blood Culture ID Panel (Reflexed)     Status: Abnormal   Collection Time: 07/26/21 11:15 PM  Result Value Ref Range Status   Enterococcus faecalis NOT DETECTED NOT DETECTED Final   Enterococcus Faecium NOT DETECTED NOT DETECTED Final   Listeria monocytogenes NOT DETECTED NOT DETECTED Final   Staphylococcus species DETECTED (A) NOT DETECTED Final    Comment: CRITICAL RESULT CALLED TO, READ BACK BY AND VERIFIED WITH: V BRYK,PHARMD_1  07/28/21 [place]    Staphylococcus aureus (BCID) DETECTED (A) NOT DETECTED Final    Comment: Methicillin (oxacillin)-resistant Staphylococcus aureus (MRSA). MRSA is predictably resistant to beta-lactam antibiotics (except ceftaroline). Preferred therapy is vancomycin unless clinically contraindicated. Patient requires contact precautions if  hospitalized. CRITICAL RESULT CALLED TO, READ BACK BY AND VERIFIED WITH: V BYRK,PHARMD_2  07/28/21 [place]    Staphylococcus epidermidis NOT DETECTED NOT DETECTED Final   Staphylococcus lugdunensis NOT DETECTED NOT DETECTED Final   Streptococcus species NOT DETECTED NOT DETECTED Final   Streptococcus agalactiae NOT DETECTED NOT DETECTED Final   Streptococcus pneumoniae NOT DETECTED NOT DETECTED Final   Streptococcus pyogenes NOT DETECTED NOT DETECTED Final   A.calcoaceticus-baumannii NOT DETECTED NOT DETECTED Final   Bacteroides fragilis NOT DETECTED NOT DETECTED Final   Enterobacterales NOT DETECTED NOT DETECTED Final   Enterobacter  cloacae complex NOT DETECTED NOT DETECTED Final   Escherichia coli NOT DETECTED NOT DETECTED Final   Klebsiella aerogenes NOT DETECTED NOT DETECTED Final   Klebsiella oxytoca NOT DETECTED NOT DETECTED  Final   Klebsiella pneumoniae NOT DETECTED NOT DETECTED Final   Proteus species NOT DETECTED NOT DETECTED Final   Salmonella species NOT DETECTED NOT DETECTED Final   Serratia marcescens NOT DETECTED NOT DETECTED Final   Haemophilus influenzae NOT DETECTED NOT DETECTED Final   Neisseria meningitidis NOT DETECTED NOT DETECTED Final   Pseudomonas aeruginosa NOT DETECTED NOT DETECTED Final   Stenotrophomonas maltophilia NOT DETECTED NOT DETECTED Final   Candida albicans NOT DETECTED NOT DETECTED Final   Candida auris NOT DETECTED NOT DETECTED Final   Candida glabrata NOT DETECTED NOT DETECTED Final   Candida krusei NOT DETECTED NOT DETECTED Final   Candida parapsilosis NOT DETECTED NOT DETECTED Final   Candida tropicalis NOT DETECTED NOT DETECTED Final   Cryptococcus neoformans/gattii NOT DETECTED NOT DETECTED Final   Meth resistant mecA/C and MREJ DETECTED (A) NOT DETECTED Final    Comment: CRITICAL RESULT CALLED TO, READ BACK BY AND VERIFIED WITH: V BRYK,PHARMD_0  07/28/21 [place] Performed at [facility], [address], [place], [place] [address]   MRSA Next Gen by PCR, Nasal     Status: Abnormal   Collection Time: 07/27/21  5:35 PM   Specimen: Nasal Mucosa; Nasal Swab  Result Value Ref Range Status   MRSA by PCR Next Gen DETECTED (A) NOT DETECTED Final    Comment: RESULT CALLED TO, READ BACK BY AND VERIFIED WITH: CAROLINE NJOROGE RN 07/27/2021 _1  BY JW (NOTE) The GeneXpert MRSA Assay (FDA approved for NASAL specimens only), is one component of a comprehensive MRSA colonization surveillance program. It is not intended to diagnose MRSA infection nor to guide or monitor treatment for MRSA infections. Test performance is not FDA approved in patients less than 89 years old. Performed at [facility], [place] [address] [place], [place] [address]     Pertinent Lab seen by me: CBC Latest Ref Rng & Units 07/28/2021 07/27/2021 07/26/2021  WBC 4.0 - 10.5 K/uL 5.3 5.6 9.7   Hemoglobin 13.0 - 17.0 g/dL 10.6(L) 10.0(L) 4.8(LL)  Hematocrit 39.0 - 52.0 % 33.8(L) 31.1(L) 16.4(L)  Platelets 150 - 400 K/uL 341 321 359   CBC Latest Ref Rng & Units 07/28/2021 07/27/2021 07/26/2021  WBC 4.0 - 10.5 K/uL 5.3 5.6 9.7  Hemoglobin 13.0 - 17.0 g/dL 10.6(L) 10.0(L) 4.8(LL)  Hematocrit 39.0 - 52.0 % 33.8(L) 31.1(L) 16.4(L)  Platelets 150 - 400 K/uL 341 321 359  ,m  Pertinent Imagings/Other Imagings Plain films and CT images have been personally visualized and interpreted; radiology reports have been reviewed. Decision making incorporated into the Impression / Recommendations.  DG Ankle 2 Views Left  Result Date: 07/27/2021 CLINICAL DATA:  Pain EXAM: LEFT ANKLE - 2 VIEW COMPARISON:  None. FINDINGS: There is no evidence of acute fracture. 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 There is osteopenia in the foot likely from disuse. IMPRESSION: No acute osseous abnormality. Electronically Signed   By: Maurine Simmering M.D.   On: 07/27/2021 08:06   DG Ankle 2 Views Right  Result Date: 07/27/2021 CLINICAL DATA:  Pain EXAM: RIGHT ANKLE - 2 VIEW COMPARISON:  None. FINDINGS: There is no evidence of acute fracture. The foot is plantar flexed. Osteopenia in the foot. IMPRESSION: No acute osseous abnormality. Electronically Signed   By: Maurine Simmering M.D.   On: 07/27/2021 08:07  CT HEAD WO CONTRAST (5MM)  Result Date: 07/27/2021 CLINICAL DATA:  Mental status change with unknown cause. EXAM: CT HEAD WITHOUT CONTRAST TECHNIQUE: Contiguous axial images were obtained from the base of the skull through the vertex without intravenous contrast. COMPARISON:  None. FINDINGS: Brain: No evidence of acute infarction, hemorrhage, hydrocephalus, extra-axial collection or mass lesion/mass effect. Cerebral volume loss for age, without specific pattern. Vascular: No hyperdense vessel or unexpected calcification. Skull: Normal. Negative for fracture or focal lesion. Sinuses/Orbits: No acute finding. IMPRESSION:  Stable head CT.  No acute finding. Electronically Signed   By: Jorje Guild M.D.   On: 07/27/2021 04:12   CT PELVIS W CONTRAST  Result Date: 07/27/2021 CLINICAL DATA:  Abscess, anal or rectal. EXAM: CT PELVIS WITH CONTRAST TECHNIQUE: Multidetector CT imaging of the pelvis was performed using the standard protocol following the bolus administration of intravenous contrast. CONTRAST:  44m OMNIPAQUE IOHEXOL 300 MG/ML  SOLN COMPARISON:  05/30/2021. FINDINGS: Urinary Tract: A Foley catheter is present in the urinary bladder. There is mild thickening of the bladder wall which may be in part due to incomplete distension. Bowel: No bowel obstruction, free air or pneumatosis. A moderate amount of retained stool is present in the sigmoid colon and rectum. No perirectal or or anal abscess is identified, however examination is limited due to the lack of IV contrast. Vascular/Lymphatic: No pathologically enlarged lymph nodes. No significant vascular abnormality seen. Reproductive:  The prostate gland is normal in size Other: A trace amount of free fluid is noted anterior to the rectum. Musculoskeletal: There is a large left medial gluteal ulcer extending into the posterior soft tissues of the upper posterior left thigh. There is bony destruction of the ischial tuberosity on the left with multiple bony fragments in the soft tissues, new from the previous exam. There is asymmetric enlargement of the obturator internus muscle and gluteal muscles on the left, possible myositis. Edema is noted in the adductor muscles in the medial proximal left thigh. IMPRESSION: 1. No definite evidence of perirectal abscess. 2. Large open ulceration in the medial gluteal region on the left. There is new bony destruction of the ischial tuberosity on the left, suggesting osteomyelitis. Enlargement of the gluteal muscles and obturator internus is noted on the left, possible myositis. MRI may be beneficial for further evaluation. 3. Foley  catheter in the urinary bladder. The bladder wall appears mildly thickened which may be in part due to incomplete distension. Correlate clinically to exclude infectious or inflammatory cystitis. Electronically Signed   By: LBrett FairyM.D.   On: 07/27/2021 04:30   DG Chest Port 1 View  Result Date: 07/26/2021 CLINICAL DATA:  Possible sepsis. EXAM: PORTABLE CHEST 1 VIEW COMPARISON:  02/01/2021. FINDINGS: The heart size and mediastinal contours are within normal limits. Both lungs are clear. Anterior and posterior cervical spinal fusion hardware is noted. IMPRESSION: No acute cardiopulmonary process. Electronically Signed   By: LBrett FairyM.D.   On: 07/26/2021 23:29    I spent more than 80  minutes for this patient encounter including review of prior medical records/discussing diagnostics and treatment plan with the patient/family/coordinate care with primary/other specialits with greater than 50% of time in face to face encounter.   Electronically signed by:   SRosiland Oz MD Infectious Disease Physician [facility] Pager: [phone]

## 2021-07-29 DIAGNOSIS — D649 Anemia, unspecified: Secondary | ICD-10-CM | POA: Diagnosis not present

## 2021-07-29 LAB — GLUCOSE, CAPILLARY
Glucose-Capillary: 117 mg/dL — ABNORMAL HIGH (ref 70–99)
Glucose-Capillary: 85 mg/dL (ref 70–99)
Glucose-Capillary: 87 mg/dL (ref 70–99)
Glucose-Capillary: 91 mg/dL (ref 70–99)
Glucose-Capillary: 94 mg/dL (ref 70–99)

## 2021-07-29 MED ORDER — DIPHENHYDRAMINE HCL 25 MG PO CAPS
25.0000 mg | ORAL_CAPSULE | Freq: Four times a day (QID) | ORAL | Status: DC | PRN
  Administered 2021-07-29 – 2021-08-02 (×6): 25 mg via ORAL
  Filled 2021-07-29 (×6): qty 1

## 2021-07-29 MED ORDER — BACLOFEN 10 MG PO TABS
20.0000 mg | ORAL_TABLET | Freq: Every day | ORAL | Status: DC
  Administered 2021-07-29 – 2021-07-31 (×2): 20 mg via ORAL
  Filled 2021-07-29 (×3): qty 2

## 2021-07-29 MED ORDER — HYDROCODONE-ACETAMINOPHEN 5-325 MG PO TABS: 1.0000 | ORAL_TABLET | Freq: Four times a day (QID) | ORAL | Status: DC | PRN

## 2021-07-29 MED ORDER — BACLOFEN 10 MG PO TABS
10.0000 mg | ORAL_TABLET | ORAL | Status: DC
Start: 2021-07-29 — End: 2021-07-29

## 2021-07-29 MED ORDER — BACLOFEN 10 MG PO TABS
10.0000 mg | ORAL_TABLET | Freq: Every day | ORAL | Status: DC
  Filled 2021-07-29 (×3): qty 1

## 2021-07-29 NOTE — Plan of Care (Signed)

## 2021-07-29 NOTE — Progress Notes (Signed)
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 No abscess. Hamstring myositis Tte no obvious veg 11/11 repeat bcx ngtd 11/09 bcx mrsa   -continue vanc -will stop cefepime/flagyl -per previous ID team recs, please discuss with ir to biopsy ischium. Proper tx is targetted therapy with I&D and flap coverage and potentially also colonic diversion -if no plan to biopsy or ancillary arrangement as mentioned could be made, would arrange outpatient wound care

## 2021-07-29 NOTE — Progress Notes (Signed)
I was called for possible bone biopsy of sacrum given osteomyelitis.  Patient has MRSA bacteremia as well.  I have spoken with Dr. Lajoyce Corners, our resident osteomyelitis subspecialist,who has confirmed that there is no clinical value in bone biopsy of an open decubitus wound, and would recommend treatment with empiric antibiotics, particularly for MRSA given the bacteremia, and consider consult to plastic surgery for evaluation if he is a candidate for flap/muscular coverage.    Eulas Post, MD

## 2021-07-29 NOTE — Plan of Care (Signed)
  Problem: Education: Goal: Knowledge of General Education information will improve Description: Including pain rating scale, medication(s)/side effects and non-pharmacologic comfort measures Outcome: Progressing   Problem: Activity: Goal: Risk for activity intolerance will decrease Outcome: Progressing   Problem: Nutrition: Goal: Adequate nutrition will be maintained 07/29/2021 0307 by Elray Mcgregor, LPN Outcome: Progressing 07/29/2021 0244 by Elray Mcgregor, LPN Outcome: Progressing   Problem: Coping: Goal: Level of anxiety will decrease 07/29/2021 0307 by Elray Mcgregor, LPN Outcome: Progressing 07/29/2021 0244 by Elray Mcgregor, LPN Outcome: Progressing   Problem: Pain Managment: Goal: General experience of comfort will improve Outcome: Progressing   Problem: Safety: Goal: Ability to remain free from injury will improve Outcome: Progressing   Problem: Skin Integrity: Goal: Risk for impaired skin integrity will decrease Outcome: Progressing

## 2021-07-29 NOTE — Progress Notes (Signed)
PROGRESS NOTE  Steve [name] TIR:[id number] DOB: 09-Feb-1991 DOA: 07/26/2021 PCP: [facility]  Brief History   The patient is a 30 yr old man who presented to [facility] ED with complaints of confusion and bleeding from his left second toe which had an ulcer. The patient had been using methamphetamine.    The patient has a medical history significant for sacral decubitus ulcer with osteomyelitis diagnosed in 05/2021, multiple decubitus ulcers including a large left buttock ulcer. He is a quadriplegic following a C spine injury. He also has anxiety, paranoid schizophrenia, and PTSD.    In the ED the patient was found to have a hemoglobin of 4.8 upon presentation. He received 3 units PRBC's in the ED. He was found to be bleeding not only from the toe, but also his chronic sacral wound. The patient was found to have a lactic acid of 2.0. WBC was 9.7. Hemoglobin following transfusion was 10.0.  Yesterday the patient refused MRI. We will try again with Ativan. He has had 1/2 BC grow out MRSA. TTE has been ordered, ID has been consulted.  Consultants  Infectious disease Wound care  Procedures  None  Antibiotics   Anti-infectives (From admission, onward)    Start     Dose/Rate Route Frequency Ordered Stop   07/28/21 2034  vancomycin (VANCOREADY) IVPB 1250 mg/250 mL        1,250 mg 166.7 mL/hr over 90 Minutes Intravenous Every 12 hours 07/28/21 1122     07/28/21 1000  metroNIDAZOLE (FLAGYL) tablet 500 mg  Status:  Discontinued        500 mg Oral Every 12 hours 07/28/21 0814 07/29/21 1321   07/27/21 1200  metroNIDAZOLE (FLAGYL) IVPB 500 mg  Status:  Discontinued        500 mg 100 mL/hr over 60 Minutes Intravenous Every 12 hours 07/27/21 0628 07/28/21 0814   07/27/21 0700  vancomycin (VANCOREADY) IVPB 1000 mg/200 mL  Status:  Discontinued        1,000 mg 200 mL/hr over 60 Minutes Intravenous Every 8 hours 07/27/21 0637 07/28/21 1122   07/27/21 0645  ceFEPIme (MAXIPIME) 2 g in sodium  chloride 0.9 % 100 mL IVPB  Status:  Discontinued        2 g 200 mL/hr over 30 Minutes Intravenous Every 8 hours 07/27/21 0637 07/29/21 1321   07/27/21 0630  ceFEPIme (MAXIPIME) 2 g in sodium chloride 0.9 % 100 mL IVPB  Status:  Discontinued        2 g 200 mL/hr over 30 Minutes Intravenous  Once 07/27/21 0628 07/27/21 0632   07/27/21 0630  vancomycin (VANCOREADY) IVPB 1000 mg/200 mL  Status:  Discontinued        1,000 mg 200 mL/hr over 60 Minutes Intravenous  Once 07/27/21 0628 07/27/21 0632   07/26/21 2300  ceFEPIme (MAXIPIME) 2 g in sodium chloride 0.9 % 100 mL IVPB        2 g 200 mL/hr over 30 Minutes Intravenous  Once 07/26/21 2258 07/27/21 0008   07/26/21 2300  metroNIDAZOLE (FLAGYL) IVPB 500 mg        500 mg 100 mL/hr over 60 Minutes Intravenous  Once 07/26/21 2258 07/27/21 0037   07/26/21 2300  vancomycin (VANCOREADY) IVPB 1500 mg/300 mL        20 mg/kg  75 kg 150 mL/hr over 120 Minutes Intravenous  Once 07/26/21 2258 07/27/21 0255      Subjective  The patient is sleeping soundly. No acute distress.  Objective   Vitals:  Vitals:   07/29/21 0400 07/29/21 0800  BP: 122/66 (!) 119/55  Pulse: 63 75  Resp: 15 15  Temp: 97.8 F (36.6 C) 97.9 F (36.6 C)  SpO2: 99% 99%    Exam:  Constitutional:  The patient is sleeping soundly. Not awakened. No acute distress. Respiratory:  No increased work of breathing. No wheezes, rales, or rhonchi No tactile fremitus Cardiovascular:  Regular rate and rhythm No murmurs, ectopy, or gallups. No lateral PMI. No thrills. Abdomen:  Abdomen is soft, non-tender, non-distended No hernias, masses, or organomegaly Normoactive bowel sounds.  Musculoskeletal:  No cyanosis, clubbing, or edema Skin:  No rashes, lesions, ulcers palpation of skin: no induration or nodules Neurologic:  Pt is unable to cooperate with exam. Psychiatric:  Pt is unable to cooperate with exam.   I have personally reviewed the following:   Today's  Data  Vitals  Lab Data  CMP  Micro Data  1/2 Blood cultures + for MRSA  Imaging  MRI pelvis pending  Cardiology Data  EKG Echocardiogram  Scheduled Meds:  baclofen  10 mg Oral Daily   And   baclofen  20 mg Oral QHS   Chlorhexidine Gluconate Cloth  6 each Topical Daily   docusate sodium  100 mg Oral BID   folic acid  1 mg Oral Daily   multivitamin with minerals  1 tablet Oral Daily   thiamine  100 mg Oral Daily   Or   thiamine  100 mg Intravenous Daily   Continuous Infusions:  sodium chloride     vancomycin 1,250 mg (07/29/21 0903)    Principal Problem:   Severe anemia Active Problems:   Acute blood loss anemia   Substance abuse (HCC)   Pressure injury of skin   Sacral osteomyelitis (HCC)   LOS: 2 days   A & P  Severe anemia likely from acute blood loss likely from the sacral wound for which patient is receiving 3 units of PRBC transfusion.  Follow CBC after transfusion.  Any further units of blood transfusion will require platelets and FFP.  At the time of my exam patient's sacral wound appears to have stopped bleeding.  We will also have to look into other source of bleeding. SIRS likely from anemia and possible infection of the sacral wound and also drug-related. MRSA bacteremia. 1/2 blood cultures have grown out MRSA. Infectious disease has been consulted. Pt is on cefepime and vancomycin. TTE has been ordered. I have explained to the patient that echocardiogram has been ordered. He began weeping and crying out asking if I am trying to kill him. I attempted to explain the reason for everything that we are doing. He continues to take issue with all modalities of treatment. Wil consider psychiatry consult if he persists in this line of thinking and expressing himself. Sacral osteomyelitis. Confirmed on MRI. Dr. Karene Fry had requested orthopedic surgery consult for bone biopsy of sacrum for culture and pathology. I have discussed the patient with Dr. Dion Saucier, who does  not feel that this is indicated. Dr. Dion Saucier has however recommended a flap closure for the sacrum and has recommended plastics for this.  On vancomycin for MRSA. Follow cultures.  I appreciate the input from infectious disease and orthopedic surgery.  Wound care has also been consulted. Acute encephalopathy suspect likely drug related.  Mental status is improving after admitting to the ER.  CT head is unremarkable.  currently sleeping Polysubstance abuse patient has admitted to taking meth.  On CIWA at this time. Patient has multiple decubitus ulcers and there is also some swelling on the left and right ankle for which I have ordered x-rays. Wound care has been consulted. History of C-spine injury with quadriplegia.  I have seen and examined this patient myself. I have spent 42 minutes in his evaluation and care.   Since patient has severe anemia with SIRS/sepsis with osteomyelitis will need close monitoring for any further worsening inpatient status.   DVT prophylaxis: SCDs.  Avoiding anticoagulation in the setting of severe anemia and blood loss. Code Status: Full code. Family Communication: None available Disposition Plan: To be determined. Consults called: Wound team. Admission status: Inpatient.   Jahmier Willadsen, DO [facility] Direct contact: see [email or web address]  7PM-7AM contact night coverage as above 07/29/2021, 3:45 PM  LOS: 1 day

## 2021-07-30 DIAGNOSIS — D649 Anemia, unspecified: Secondary | ICD-10-CM | POA: Diagnosis not present

## 2021-07-30 LAB — CBC WITH DIFFERENTIAL/PLATELET
Abs Immature Granulocytes: 0.02 10*3/uL (ref 0.00–0.07)
Basophils Absolute: 0 10*3/uL (ref 0.0–0.1)
Basophils Relative: 1 %
Eosinophils Absolute: 0.4 10*3/uL (ref 0.0–0.5)
Eosinophils Relative: 12 %
HCT: 33.3 % — ABNORMAL LOW (ref 39.0–52.0)
Hemoglobin: 10.3 g/dL — ABNORMAL LOW (ref 13.0–17.0)
Immature Granulocytes: 1 %
Lymphocytes Relative: 37 %
Lymphs Abs: 1.3 10*3/uL (ref 0.7–4.0)
MCH: 26.7 pg (ref 26.0–34.0)
MCHC: 30.9 g/dL (ref 30.0–36.0)
MCV: 86.3 fL (ref 80.0–100.0)
Monocytes Absolute: 0.3 10*3/uL (ref 0.1–1.0)
Monocytes Relative: 9 %
Neutro Abs: 1.4 10*3/uL — ABNORMAL LOW (ref 1.7–7.7)
Neutrophils Relative %: 40 %
Platelets: 370 10*3/uL (ref 150–400)
RBC: 3.86 MIL/uL — ABNORMAL LOW (ref 4.22–5.81)
RDW: 15.9 % — ABNORMAL HIGH (ref 11.5–15.5)
WBC: 3.4 10*3/uL — ABNORMAL LOW (ref 4.0–10.5)
nRBC: 0 % (ref 0.0–0.2)

## 2021-07-30 LAB — CULTURE, BLOOD (ROUTINE X 2)
Special Requests: ADEQUATE
Special Requests: ADEQUATE

## 2021-07-30 LAB — GLUCOSE, CAPILLARY
Glucose-Capillary: 126 mg/dL — ABNORMAL HIGH (ref 70–99)
Glucose-Capillary: 109 mg/dL — ABNORMAL HIGH (ref 70–99)
Glucose-Capillary: 114 mg/dL — ABNORMAL HIGH (ref 70–99)
Glucose-Capillary: 93 mg/dL (ref 70–99)
Glucose-Capillary: 90 mg/dL (ref 70–99)

## 2021-07-30 LAB — BASIC METABOLIC PANEL
Anion gap: 6 (ref 5–15)
BUN: 5 mg/dL — ABNORMAL LOW (ref 6–20)
CO2: 28 mmol/L (ref 22–32)
Calcium: 8.2 mg/dL — ABNORMAL LOW (ref 8.9–10.3)
Chloride: 102 mmol/L (ref 98–111)
Creatinine, Ser: 0.66 mg/dL (ref 0.61–1.24)
GFR, Estimated: >60 mL/min (ref >60)
Glucose, Bld: 98 mg/dL (ref 70–99)
Potassium: 4.1 mmol/L (ref 3.5–5.1)
Sodium: 136 mmol/L (ref 135–145)

## 2021-07-30 LAB — VANCOMYCIN, TROUGH: Vancomycin Tr: 15 ug/mL (ref 15–20)

## 2021-07-30 LAB — VANCOMYCIN, PEAK: Vancomycin Pk: 48 ug/mL — ABNORMAL HIGH (ref 30–40)

## 2021-07-30 MED ORDER — VANCOMYCIN HCL 1000 MG/200ML IV SOLN
1000.0000 mg | Freq: Two times a day (BID) | INTRAVENOUS | Status: DC
  Administered 2021-07-31 – 2021-08-01 (×3): 1000 mg via INTRAVENOUS
  Filled 2021-07-30 (×3): qty 200

## 2021-07-30 MED ORDER — AMOXICILLIN-POT CLAVULANATE 875-125 MG PO TABS
1.0000 | ORAL_TABLET | Freq: Two times a day (BID) | ORAL | Status: DC
  Administered 2021-07-31 – 2021-08-02 (×6): 1 via ORAL
  Filled 2021-07-30 (×6): qty 1

## 2021-07-30 NOTE — Progress Notes (Signed)
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 No abscess. Hamstring myositis Tte no obvious veg 11/11 repeat bcx ngtd 11/09 bcx mrsa  Vitals reviewed afebrile Labs reviewed   A/p No plan for surgical debridement/sampling of ischium As patient will continue to be at risk for ongoing ulcer, literature suggest recurrence of this problem is high (chronic osteomyelitis) and treatment with prolonged abx is ultimately without efficacy without dedicated debridement/colonic diversion/flap coverage and aggressive wound care.  At this time would focus on mrsa bsi treatment  Reasonable with mri finding myositis to give short course empiric abx as well for soft tissue infection   -continue vanc -add amox-clav: plan for 2 weeks until 11/27 -dr Elinor Parkinson to follow up tomorrow and finalize plan/arrange opat id follow up

## 2021-07-30 NOTE — Progress Notes (Signed)
PROGRESS NOTE  EVELYN MOCH GGE:[id number] DOB: 1991-04-19 DOA: 07/26/2021 PCP: [facility]  Brief History   The patient is a 30 yr old man who presented to [facility] ED with complaints of confusion and bleeding from his left second toe which had an ulcer. The patient had been using methamphetamine.    The patient has a medical history significant for sacral decubitus ulcer with osteomyelitis diagnosed in 05/2021, multiple decubitus ulcers including a large left buttock ulcer. He is a quadriplegic following a C spine injury. He also has anxiety, paranoid schizophrenia, and PTSD.    In the ED the patient was found to have a hemoglobin of 4.8 upon presentation. He received 3 units PRBC's in the ED. He was found to be bleeding not only from the toe, but also his chronic sacral wound. The patient was found to have a lactic acid of 2.0. WBC was 9.7. Hemoglobin following transfusion was 10.0.  Yesterday the patient refused MRI. We will try again with Ativan. He has had 1/2 BC grow out MRSA. TTE has been ordered, ID has been consulted.   ID had recommended Biopsy of the sacral bone for bone culture and pathology. Dr. Dion Saucier of orthopedic surgery was consulted. He didn't feel that the procedure was indicated. ID now recommends only continuation of vancomycin.  Consultants  Infectious disease Wound care  Procedures  None  Antibiotics   Anti-infectives (From admission, onward)    Start     Dose/Rate Route Frequency Ordered Stop   07/28/21 2034  vancomycin (VANCOREADY) IVPB 1250 mg/250 mL        1,250 mg 166.7 mL/hr over 90 Minutes Intravenous Every 12 hours 07/28/21 1122     07/28/21 1000  metroNIDAZOLE (FLAGYL) tablet 500 mg  Status:  Discontinued        500 mg Oral Every 12 hours 07/28/21 0814 07/29/21 1321   07/27/21 1200  metroNIDAZOLE (FLAGYL) IVPB 500 mg  Status:  Discontinued        500 mg 100 mL/hr over 60 Minutes Intravenous Every 12 hours 07/27/21 0628 07/28/21 0814    07/27/21 0700  vancomycin (VANCOREADY) IVPB 1000 mg/200 mL  Status:  Discontinued        1,000 mg 200 mL/hr over 60 Minutes Intravenous Every 8 hours 07/27/21 0637 07/28/21 1122   07/27/21 0645  ceFEPIme (MAXIPIME) 2 g in sodium chloride 0.9 % 100 mL IVPB  Status:  Discontinued        2 g 200 mL/hr over 30 Minutes Intravenous Every 8 hours 07/27/21 0637 07/29/21 1321   07/27/21 0630  ceFEPIme (MAXIPIME) 2 g in sodium chloride 0.9 % 100 mL IVPB  Status:  Discontinued        2 g 200 mL/hr over 30 Minutes Intravenous  Once 07/27/21 0628 07/27/21 0632   07/27/21 0630  vancomycin (VANCOREADY) IVPB 1000 mg/200 mL  Status:  Discontinued        1,000 mg 200 mL/hr over 60 Minutes Intravenous  Once 07/27/21 0628 07/27/21 0632   07/26/21 2300  ceFEPIme (MAXIPIME) 2 g in sodium chloride 0.9 % 100 mL IVPB        2 g 200 mL/hr over 30 Minutes Intravenous  Once 07/26/21 2258 07/27/21 0008   07/26/21 2300  metroNIDAZOLE (FLAGYL) IVPB 500 mg        500 mg 100 mL/hr over 60 Minutes Intravenous  Once 07/26/21 2258 07/27/21 0037   07/26/21 2300  vancomycin (VANCOREADY) IVPB 1500 mg/300 mL  20 mg/kg  75 kg 150 mL/hr over 120 Minutes Intravenous  Once 07/26/21 2258 07/27/21 0255      Subjective  The patient is sleeping soundly. No acute distress.   Objective   Vitals:  Vitals:   07/29/21 2119 07/30/21 0555  BP: (!) 88/54 (!) 114/55  Pulse: 60   Resp: 14   Temp:  97.6 F (36.4 C)  SpO2: 99%     Exam:  Constitutional:  The patient is awake and alert. No acute distress. Respiratory:  No increased work of breathing. No wheezes, rales, or rhonchi No tactile fremitus Cardiovascular:  Regular rate and rhythm No murmurs, ectopy, or gallups. No lateral PMI. No thrills. Abdomen:  Abdomen is soft, non-tender, non-distended No hernias, masses, or organomegaly Normoactive bowel sounds.  Musculoskeletal:  No cyanosis, clubbing, or edema Skin:  No rashes, lesions, ulcers palpation of  skin: no induration or nodules Neurologic:  Pt is unable to cooperate with exam. Psychiatric:  Pt is unable to cooperate with exam.   I have personally reviewed the following:   Today's Data  Vitals  Lab Data  BMP, CBC  Micro Data  1/2 Blood cultures + for MRSA  Imaging  MRI pelvis pending  Cardiology Data  EKG Echocardiogram  Scheduled Meds:  baclofen  10 mg Oral Daily   And   baclofen  20 mg Oral QHS   Chlorhexidine Gluconate Cloth  6 each Topical Daily   docusate sodium  100 mg Oral BID   folic acid  1 mg Oral Daily   multivitamin with minerals  1 tablet Oral Daily   thiamine  100 mg Oral Daily   Or   thiamine  100 mg Intravenous Daily   Continuous Infusions:  sodium chloride     vancomycin 1,250 mg (07/30/21 0957)    Principal Problem:   Severe anemia Active Problems:   Acute blood loss anemia   Substance abuse (HCC)   Pressure injury of skin   Sacral osteomyelitis (HCC)   LOS: 3 days   A & P  Severe anemia likely from acute blood loss likely from the sacral wound for which patient is receiving 3 units of PRBC transfusion.  Follow CBC after transfusion.  Any further units of blood transfusion will require platelets and FFP.  At the time of my exam patient's sacral wound appears to have stopped bleeding.  We will also have to look into other source of bleeding. SIRS likely from anemia and possible infection of the sacral wound and also drug-related. MRSA bacteremia. 1/2 blood cultures have grown out MRSA. Infectious disease has been consulted. Pt is on cefepime and vancomycin. TTE has been ordered. I have explained to the patient that echocardiogram has been ordered. He began weeping and crying out asking if I am trying to kill him. I attempted to explain the reason for everything that we are doing. He continues to take issue with all modalities of treatment. Wil consider psychiatry consult if he persists in this line of thinking and expressing  himself. Sacral osteomyelitis. Confirmed on MRI. Dr. Karene Fry had requested orthopedic surgery consult for bone biopsy of sacrum for culture and pathology. I have discussed the patient with Dr. Dion Saucier, who does not feel that this is indicated. Dr. Dion Saucier has however recommended a flap closure for the sacrum and has recommended plastics for this.  On vancomycin for MRSA. Follow cultures.  I appreciate the input from infectious disease and orthopedic surgery.  Wound care has also been consulted. Acute  encephalopathy suspect likely drug related.  Mental status is improving after admitting to the ER.  CT head is unremarkable.  currently sleeping Polysubstance abuse patient has admitted to taking meth.  On CIWA at this time. Patient has multiple decubitus ulcers and there is also some swelling on the left and right ankle for which I have ordered x-rays. Wound care has been consulted. History of C-spine injury with quadriplegia.  I have seen and examined this patient myself. I have spent 34 minutes in his evaluation and care.   Since patient has severe anemia with SIRS/sepsis with osteomyelitis will need close monitoring for any further worsening inpatient status.   DVT prophylaxis: SCDs.  Avoiding anticoagulation in the setting of severe anemia and blood loss. Code Status: Full code. Family Communication: None available Disposition Plan: To be determined. Consults called: Wound team. Admission status: Inpatient.   Anneka Studer, DO [facility] Direct contact: see [email or web address]  7PM-7AM contact night coverage as above 07/30/2021, 1:59 PM  LOS: 1 day

## 2021-07-30 NOTE — Progress Notes (Signed)
Pharmacy Antibiotic Note  Steve [name] is a 30 y.o. male admitted on 07/26/2021 with MRSA bacteremia and possible sacral osteomyelitis.  Pharmacy has been consulted for Vancomycin/Cefepime dosing.   WBC WNL. Renal function stable and okay but patient quadriplegic- Will round SCr to 1.1.   Currently receiving Vancomycin 1250mg  IV q12 hours Peak 48 Trough 15 Calculated AUC 705  Will adjust dose to 1g IV q12 New expected AUC of 556   Plan: Adjust Vancomycin to 1000 mg every 12 hours  - Predicted AUC 556 Augmentin added by ID Monitor renal function and levels as appropriate  F/U MRI and bacteremia work-up   Height: 6' (182.9 cm) Weight: 75 kg (165 lb 5.5 oz) IBW/kg (Calculated) : 77.6  Temp (24hrs), Avg:97.6 F (36.4 C), Min:97.6 F (36.4 C), Max:97.6 F (36.4 C)  Recent Labs  Lab 07/26/21 2258 07/27/21 0058 07/27/21 0236 07/27/21 1230 07/28/21 0230 07/30/21 0118 07/30/21 1155 07/30/21 2126  WBC 9.7  --   --  5.6 5.3 3.4*  --   --   CREATININE  --   --  0.76  --  0.59* 0.66  --   --   LATICACIDVEN 2.0* 1.2  --   --   --   --   --   --   VANCOTROUGH  --   --   --   --   --   --   --  15  VANCOPEAK  --   --   --   --   --   --  48*  --      Estimated Creatinine Clearance: 141.9 mL/min (by C-G formula based on SCr of 0.66 mg/dL).    Allergies  Allergen Reactions   Caffeine Anaphylaxis   Caffeine Anaphylaxis   Chocolate Anaphylaxis   Chocolate Anaphylaxis   Tuberculin     Other reaction(s): Eruption of skin   Sulfa Antibiotics Rash   Sulfa Antibiotics Rash   [id number] PharmD., BCPS Clinical Pharmacist 07/30/2021 10:44 PM

## 2021-07-31 DIAGNOSIS — D649 Anemia, unspecified: Secondary | ICD-10-CM | POA: Diagnosis not present

## 2021-07-31 LAB — C-REACTIVE PROTEIN: CRP: 0.7 mg/dL (ref <1.0)

## 2021-07-31 LAB — GLUCOSE, CAPILLARY
Glucose-Capillary: 126 mg/dL — ABNORMAL HIGH (ref 70–99)
Glucose-Capillary: 115 mg/dL — ABNORMAL HIGH (ref 70–99)
Glucose-Capillary: 97 mg/dL (ref 70–99)
Glucose-Capillary: 91 mg/dL (ref 70–99)
Glucose-Capillary: 108 mg/dL — ABNORMAL HIGH (ref 70–99)

## 2021-07-31 LAB — SEDIMENTATION RATE: Sed Rate: 65 mm/hr — ABNORMAL HIGH (ref 0–16)

## 2021-07-31 NOTE — Plan of Care (Signed)
  Problem: Clinical Measurements: Goal: Respiratory complications will improve Outcome: Progressing   Problem: Nutrition: Goal: Adequate nutrition will be maintained Outcome: Progressing   Problem: Elimination: Goal: Will not experience complications related to bowel motility Outcome: Progressing   Problem: Pain Managment: Goal: General experience of comfort will improve Outcome: Progressing

## 2021-07-31 NOTE — Progress Notes (Signed)
[facility] Infectious Diseases Follow Up Note  Patient Identification: Patient Name: Steve [name] MRN: [id number] [place] Date: 07/26/2021 10:48 PM Age: 30 y.o.Today's Date: 07/31/2021   Reason for Visit: MRSA bacteremia/Osteomyelitis   Principal Problem:   Severe anemia Active Problems:   Acute blood loss anemia   Substance abuse ([place])   Pressure injury of skin   Sacral osteomyelitis (HCC)  Antibiotics: Vancomycin 11/9-current                     Cefepime 11/9 - 11/11                    Metronidazole 11/9-11/12   Lines/Hardware: cervical fusion hardaware    Interval Events: remains afebrile, no leukocytosis. TTE negative for endocarditis. Repeat blood cx negative in 2 days. 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; Chronic, non infected with pink granulation tissue Acute left ischial tuberosity Osteomyelitis  Multiple healing wounds in the Left elbow, left forearm, Left lateral and RT medial malleolus Acute anemia in the setting of bleeding wounds  Recent Pseudomonas putida bacteremia Polysubstance abuse   Recommendations Continue Vancomycin, pharmacy to dose and Augmentin as is  Fu repeat blood cx 11/11 for clearance Fu TEE planned for 11/14 per Cards Monitor CBC, BMP and Vancomycin trough  Final recommendations to follow post TEE findings Continue Wound care Following   Rest of the management as per the primary team. Thank you for the consult. Please page with pertinent questions or concerns.  ______________________________________________________________________ Subjective patient seen and examined at the bedside. Denies any pain/discomfort. Denies nausea, vomiting and diarrhea, He is not so interactive    Vitals BP 109/63 (BP Location: Left Arm)   Pulse 66   Temp 97.6 F (36.4 C) (Oral)   Resp 15   Ht 6' (1.829 m)   Wt 75 kg   SpO2 91%   BMI 22.42 kg/m     Physical Exam Constitutional:  Lying in bed and appears comfortable     Comments:   Cardiovascular:     Rate and Rhythm: Normal rate and regular rhythm.     Heart sounds:   Pulmonary:     Effort: Pulmonary effort is normal.     Comments:   Abdominal:     Palpations: Abdomen is soft.     Tenderness:   Musculoskeletal:        General: No swelling or tenderness.   Skin:    Comments: Multiple healing wounds in the Left elbow, left forearm, Left lateral and RT medial malleolus. Left medial ischial ulcer with pink granulation tissue.   Neurological:     General: incomplete quadriplegia   Psychiatric:        Mood and Affect: Flat affect    Pertinent Microbiology Results for orders placed or performed during the hospital encounter of 07/26/21  Resp Panel by RT-PCR (Flu A&B, Covid) Nasopharyngeal Swab     Status: None   Collection Time: 07/26/21 10:58 PM   Specimen: Nasopharyngeal Swab; Nasopharyngeal(NP) swabs in vial transport medium  Result Value Ref Range Status   SARS Coronavirus 2 by RT PCR NEGATIVE NEGATIVE Final    Comment: (NOTE) SARS-CoV-2 target nucleic acids are NOT DETECTED.  The SARS-CoV-2 RNA is generally detectable in upper respiratory specimens during the acute phase of infection. The  lowest concentration of SARS-CoV-2 viral copies this assay can detect is 138 copies/mL. A negative result does not preclude SARS-Cov-2 infection and should not be used as the sole basis for treatment or other patient management decisions. A negative result may occur with  improper specimen collection/handling, submission of specimen other than nasopharyngeal swab, presence of viral mutation(s) within the areas targeted by this assay, and inadequate number of viral copies(<138 copies/mL). A negative result must be  combined with clinical observations, patient history, and epidemiological information. The expected result is Negative.  Fact Sheet for Patients:  EntrepreneurPulse.com.au  Fact Sheet for Healthcare Providers:  IncredibleEmployment.be  This test is no t yet approved or cleared by the [place] FDA and  has been authorized for detection and/or diagnosis of SARS-CoV-2 by FDA under an Emergency Use Authorization (EUA). This EUA will remain  in effect (meaning this test can be used) for the duration of the COVID-19 declaration under Section 564(b)(1) of the Act, 21 U.S.C.section 360bbb-3(b)(1), unless the authorization is terminated  or revoked sooner.       Influenza A by PCR NEGATIVE NEGATIVE Final   Influenza B by PCR NEGATIVE NEGATIVE Final    Comment: (NOTE) The Xpert Xpress SARS-CoV-2/FLU/RSV plus assay is intended as an aid in the diagnosis of influenza from Nasopharyngeal swab specimens and should not be used as a sole basis for treatment. Nasal washings and aspirates are unacceptable for Xpert Xpress SARS-CoV-2/FLU/RSV testing.  Fact Sheet for Patients: EntrepreneurPulse.com.au  Fact Sheet for Healthcare Providers: IncredibleEmployment.be  This test is not yet approved or cleared by the [place] FDA and has been authorized for detection and/or diagnosis of SARS-CoV-2 by FDA under an Emergency Use Authorization (EUA). This EUA will remain in effect (meaning this test can be used) for the duration of the COVID-19 declaration under Section 564(b)(1) of the Act, 21 U.S.C. section 360bbb-3(b)(1), unless the authorization is terminated or revoked.  Performed at [facility], [place] [address], [place], [place] [address]   Urine Culture     Status: Abnormal   Collection Time: 07/26/21 10:58 PM   Specimen: In/Out Cath Urine  Result Value Ref Range Status   Specimen Description IN/OUT CATH  URINE  Final   Special Requests   Final    NONE Performed at [facility], [place] [address], [place], [place] [address]    Culture MULTIPLE SPECIES PRESENT, SUGGEST RECOLLECTION (A)  Final   Report Status 07/27/2021 FINAL  Final  Blood Culture (routine x 2)     Status: Abnormal   Collection Time: 07/26/21 11:05 PM   Specimen: BLOOD  Result Value Ref Range Status   Specimen Description BLOOD RIGHT ANTECUBITAL  Final   Special Requests   Final    BOTTLES DRAWN AEROBIC AND ANAEROBIC Blood Culture adequate volume   Culture  Setup Time   Final    GRAM POSITIVE COCCI IN CLUSTERS IN BOTH AEROBIC AND ANAEROBIC BOTTLES CRITICAL VALUE NOTED.  VALUE IS CONSISTENT WITH PREVIOUSLY REPORTED AND CALLED VALUE.    Culture (A)  Final    STAPHYLOCOCCUS AUREUS SUSCEPTIBILITIES PERFORMED ON PREVIOUS CULTURE WITHIN THE LAST 5 DAYS. Performed at [facility], [place] [address], [place], [place] [address]    Report Status 07/30/2021 FINAL  Final  Blood Culture (routine x 2)     Status: Abnormal   Collection Time: 07/26/21 11:15 PM   Specimen: BLOOD RIGHT WRIST  Result Value Ref Range Status   Specimen Description BLOOD RIGHT  WRIST  Final   Special Requests   Final    BOTTLES DRAWN AEROBIC AND ANAEROBIC Blood Culture adequate volume   Culture  Setup Time   Final    GRAM POSITIVE COCCI IN CLUSTERS AEROBIC BOTTLE ONLY CRITICAL RESULT CALLED TO, READ BACK BY AND VERIFIED WITH: V BRYK,PHARMD@0553  07/28/21 [place]    Culture (A)  Final    METHICILLIN RESISTANT STAPHYLOCOCCUS AUREUS Two isolates with different morphologies were identified as the same organism.The most resistant organism was reported. Performed at [facility], [place] [address] [place], [place] [address]    Report Status 07/30/2021 FINAL  Final   Organism ID, Bacteria METHICILLIN RESISTANT STAPHYLOCOCCUS AUREUS  Final      Susceptibility   Methicillin resistant staphylococcus aureus - MIC*    CIPROFLOXACIN >=8 RESISTANT  Resistant     ERYTHROMYCIN >=8 RESISTANT Resistant     GENTAMICIN >=16 RESISTANT Resistant     OXACILLIN >=4 RESISTANT Resistant     TETRACYCLINE >=16 RESISTANT Resistant     VANCOMYCIN 1 SENSITIVE Sensitive     TRIMETH/SULFA <=10 SENSITIVE Sensitive     CLINDAMYCIN >=8 RESISTANT Resistant     RIFAMPIN <=0.5 SENSITIVE Sensitive     Inducible Clindamycin NEGATIVE Sensitive     * METHICILLIN RESISTANT STAPHYLOCOCCUS AUREUS  Blood Culture ID Panel (Reflexed)     Status: Abnormal   Collection Time: 07/26/21 11:15 PM  Result Value Ref Range Status   Enterococcus faecalis NOT DETECTED NOT DETECTED Final   Enterococcus Faecium NOT DETECTED NOT DETECTED Final   Listeria monocytogenes NOT DETECTED NOT DETECTED Final   Staphylococcus species DETECTED (A) NOT DETECTED Final    Comment: CRITICAL RESULT CALLED TO, READ BACK BY AND VERIFIED WITH: V BRYK,PHARMD@0553  07/28/21 [place]    Staphylococcus aureus (BCID) DETECTED (A) NOT DETECTED Final    Comment: Methicillin (oxacillin)-resistant Staphylococcus aureus (MRSA). MRSA is predictably resistant to beta-lactam antibiotics (except ceftaroline). Preferred therapy is vancomycin unless clinically contraindicated. Patient requires contact precautions if  hospitalized. CRITICAL RESULT CALLED TO, READ BACK BY AND VERIFIED WITH: V BYRK,PHARMD@0554  07/28/21 [place]    Staphylococcus epidermidis NOT DETECTED NOT DETECTED Final   Staphylococcus lugdunensis NOT DETECTED NOT DETECTED Final   Streptococcus species NOT DETECTED NOT DETECTED Final   Streptococcus agalactiae NOT DETECTED NOT DETECTED Final   Streptococcus pneumoniae NOT DETECTED NOT DETECTED Final   Streptococcus pyogenes NOT DETECTED NOT DETECTED Final   A.calcoaceticus-baumannii NOT DETECTED NOT DETECTED Final   Bacteroides fragilis NOT DETECTED NOT DETECTED Final   Enterobacterales NOT DETECTED NOT DETECTED Final   Enterobacter cloacae complex NOT DETECTED NOT DETECTED Final   Escherichia coli NOT  DETECTED NOT DETECTED Final   Klebsiella aerogenes NOT DETECTED NOT DETECTED Final   Klebsiella oxytoca NOT DETECTED NOT DETECTED Final   Klebsiella pneumoniae NOT DETECTED NOT DETECTED Final   Proteus species NOT DETECTED NOT DETECTED Final   Salmonella species NOT DETECTED NOT DETECTED Final   Serratia marcescens NOT DETECTED NOT DETECTED Final   Haemophilus influenzae NOT DETECTED NOT DETECTED Final   Neisseria meningitidis NOT DETECTED NOT DETECTED Final   Pseudomonas aeruginosa NOT DETECTED NOT DETECTED Final   Stenotrophomonas maltophilia NOT DETECTED NOT DETECTED Final   Candida albicans NOT DETECTED NOT DETECTED Final   Candida auris NOT DETECTED NOT DETECTED Final   Candida glabrata NOT DETECTED NOT DETECTED Final   Candida krusei NOT DETECTED NOT DETECTED Final   Candida parapsilosis NOT DETECTED NOT DETECTED Final   Candida tropicalis NOT DETECTED NOT  DETECTED Final   Cryptococcus neoformans/gattii NOT DETECTED NOT DETECTED Final   Meth resistant mecA/C and MREJ DETECTED (A) NOT DETECTED Final    Comment: CRITICAL RESULT CALLED TO, READ BACK BY AND VERIFIED WITH: V BRYK,PHARMD@0553  07/28/21 [place] Performed at [facility], [address] [place], [place] [address]   MRSA Next Gen by PCR, Nasal     Status: Abnormal   Collection Time: 07/27/21  5:35 PM   Specimen: Nasal Mucosa; Nasal Swab  Result Value Ref Range Status   MRSA by PCR Next Gen DETECTED (A) NOT DETECTED Final    Comment: RESULT CALLED TO, READ BACK BY AND VERIFIED WITH: CAROLINE NJOROGE RN 07/27/2021 @2035  BY JW (NOTE) The GeneXpert MRSA Assay (FDA approved for NASAL specimens only), is one component of a comprehensive MRSA colonization surveillance program. It is not intended to diagnose MRSA infection nor to guide or monitor treatment for MRSA infections. Test performance is not FDA approved in patients less than 25 years old. Performed at [facility], [address], [place],  [place]   Culture, blood (routine x 2)     Status: None (Preliminary result)   Collection Time: 07/28/21  8:43 AM   Specimen: BLOOD  Result Value Ref Range Status   Specimen Description BLOOD LEFT ANTECUBITAL  Final   Special Requests   Final    BOTTLES DRAWN AEROBIC AND ANAEROBIC Blood Culture adequate volume   Culture   Final    NO GROWTH 2 DAYS Performed at [facility], [address], [place], [place]    Report Status PENDING  Incomplete  Culture, blood (routine x 2)     Status: None (Preliminary result)   Collection Time: 07/28/21  8:49 AM   Specimen: BLOOD LEFT HAND  Result Value Ref Range Status   Specimen Description BLOOD LEFT HAND  Final   Special Requests   Final    BOTTLES DRAWN AEROBIC AND ANAEROBIC Blood Culture adequate volume   Culture   Final    NO GROWTH 2 DAYS Performed at [facility], [address], [place], [place]    Report Status PENDING  Incomplete    Pertinent Lab. CBC Latest Ref Rng & Units 07/30/2021 07/28/2021 07/27/2021  WBC 4.0 - 10.5 K/uL 3.4(L) 5.3 5.6  Hemoglobin 13.0 - 17.0 g/dL 10.3(L) 10.6(L) 10.0(L)  Hematocrit 39.0 - 52.0 % 33.3(L) 33.8(L) 31.1(L)  Platelets 150 - 400 K/uL 370 341 321   CMP Latest Ref Rng & Units 07/30/2021 07/28/2021 07/27/2021  Glucose 70 - 99 mg/dL 98 85 13/06/2021)  BUN 6 - 20 mg/dL 5(L) 932(T) 8  Creatinine 0.61 - 1.24 mg/dL <5(T 7.32) 2.02(R  Sodium 135 - 145 mmol/L 136 136 131(L)  Potassium 3.5 - 5.1 mmol/L 4.1 4.1 3.7  Chloride 98 - 111 mmol/L 102 104 100  CO2 22 - 32 mmol/L 28 26 21(L)  Calcium 8.9 - 10.3 mg/dL 8.2(L) 8.3(L) 8.0(L)  Total Protein 6.5 - 8.1 g/dL - 6.2(L) 6.4(L)  Total Bilirubin 0.3 - 1.2 mg/dL - 0.4 0.3  Alkaline Phos 38 - 126 U/L - 57 60  AST 15 - 41 U/L - 10(L) 16  ALT 0 - 44 U/L - 9 8     Pertinent Imaging today Plain films and CT images have been personally visualized and interpreted; radiology reports have been reviewed. Decision making incorporated  into the Impression / Recommendations.  07/31/21 Left ventricular ejection fraction, by estimation, is 55  to 60%. The left ventricle has normal function. The left ventricle has no regional wall motion abnormalities. Left ventricular diastolic parameters were normal. 2. Right ventricular systolic function is normal. The right ventricular size is normal. 3. The mitral valve is normal in structure. No evidence of mitral valve regurgitation. The aortic valve is normal in structure. Aortic valve regurgitation is not visualized. No aortic stenosis is present. 4.The inferior vena cava is normal in size with greater than 50% respiratory variability, suggesting right atrial pressure of 3 mmHg. 5.\Comparison(s): No prior Echocardiogram. Conclusion(s)/Recommendation(s): Normal biventricular function without evidence of hemodynamically significant valvular heart  I spent more than 35 minutes for this patient encounter including review of prior medical records, coordination of care  with greater than 50% of time being face to face/counseling and discussing diagnostics/treatment plan with the patient/family.  Electronically signed by:   Rosiland Oz, MD Infectious Disease Physician [facility] Pager: [phone]

## 2021-07-31 NOTE — Progress Notes (Signed)
    [facility] has been requested to perform a transesophageal echocardiogram on Steve [name] for bacteremia.  After careful review of history and examination, the risks and benefits of transesophageal echocardiogram have been explained including risks of esophageal damage, perforation (1:10,000 risk), bleeding, pharyngeal hematoma as well as other potential complications associated with conscious sedation including aspiration, arrhythmia, respiratory failure and death. Alternatives to treatment were discussed, questions were answered. Patient is willing to proceed.  TEE - Dr. Rennis Golden @ 1330 . NPO after midnight. Meds with sips.   Manson Passey, PA-C 07/31/2021 5:05 PM

## 2021-07-31 NOTE — Progress Notes (Signed)
PROGRESS NOTE  DEVLIN BRINK OHY:[id number] DOB: 09-05-1991 DOA: 07/26/2021 [facility]CP: [facility]  Brief History   The patient is a 30 yr old man who presented to [facility] ED with complaints of confusion and bleeding from his left second toe which had an ulcer. The patient had been using methamphetamine.    The patient has a medical history significant for sacral decubitus ulcer with osteomyelitis diagnosed in 05/2021, multiple decubitus ulcers including a large left buttock ulcer. He is a quadriplegic following a C spine injury. He also has anxiety, paranoid schizophrenia, and PTSD.    In the ED the patient was found to have a hemoglobin of 4.8 upon presentation. He received 3 units PRBC's in the ED. He was found to be bleeding not only from the toe, but also his chronic sacral wound. The patient was found to have a lactic acid of 2.0. WBC was 9.7. Hemoglobin following transfusion was 10.0.  Yesterday the patient refused MRI. We will try again with Ativan. He has had 1/2 BC grow out MRSA. TTE has been ordered, ID has been consulted.   ID had recommended Biopsy of the sacral bone for bone culture and pathology. Dr. Mardelle Matte of orthopedic surgery was consulted. He didn't feel that the procedure was indicated. ID now recommends only continuation of vancomycin. ESR is 65.  The patient will go for TEE today. Consultants  Infectious disease Wound care  Procedures  None  Antibiotics   Anti-infectives (From admission, onward)    Start     Dose/Rate Route Frequency Ordered Stop   07/31/21 1100  vancomycin (VANCOREADY) IVPB 1000 mg/200 mL        1,000 mg 200 mL/hr over 60 Minutes Intravenous Every 12 hours 07/30/21 2246     07/31/21 1000  amoxicillin-clavulanate (AUGMENTIN) 875-125 MG per tablet 1 tablet        1 tablet Oral Every 12 hours 07/30/21 2133     07/28/21 2034  vancomycin (VANCOREADY) IVPB 1250 mg/250 mL  Status:  Discontinued        1,250 mg 166.7 mL/hr over 90 Minutes  Intravenous Every 12 hours 07/28/21 1122 07/30/21 2246   07/28/21 1000  metroNIDAZOLE (FLAGYL) tablet 500 mg  Status:  Discontinued        500 mg Oral Every 12 hours 07/28/21 0814 07/29/21 1321   07/27/21 1200  metroNIDAZOLE (FLAGYL) IVPB 500 mg  Status:  Discontinued        500 mg 100 mL/hr over 60 Minutes Intravenous Every 12 hours 07/27/21 0628 07/28/21 0814   07/27/21 0700  vancomycin (VANCOREADY) IVPB 1000 mg/200 mL  Status:  Discontinued        1,000 mg 200 mL/hr over 60 Minutes Intravenous Every 8 hours 07/27/21 0637 07/28/21 1122   07/27/21 0645  ceFEPIme (MAXIPIME) 2 g in sodium chloride 0.9 % 100 mL IVPB  Status:  Discontinued        2 g 200 mL/hr over 30 Minutes Intravenous Every 8 hours 07/27/21 0637 07/29/21 1321   07/27/21 0630  ceFEPIme (MAXIPIME) 2 g in sodium chloride 0.9 % 100 mL IVPB  Status:  Discontinued        2 g 200 mL/hr over 30 Minutes Intravenous  Once 07/27/21 0628 07/27/21 0632   07/27/21 0630  vancomycin (VANCOREADY) IVPB 1000 mg/200 mL  Status:  Discontinued        1,000 mg 200 mL/hr over 60 Minutes Intravenous  Once 07/27/21 9485 07/27/21 4627   07/26/21 2300  ceFEPIme (MAXIPIME) 2 g in sodium chloride 0.9 % 100 mL IVPB        2 g 200 mL/hr over 30 Minutes Intravenous  Once 07/26/21 2258 07/27/21 0008   07/26/21 2300  metroNIDAZOLE (FLAGYL) IVPB 500 mg        500 mg 100 mL/hr over 60 Minutes Intravenous  Once 07/26/21 2258 07/27/21 0037   07/26/21 2300  vancomycin (VANCOREADY) IVPB 1500 mg/300 mL        20 mg/kg  75 kg 150 mL/hr over 120 Minutes Intravenous  Once 07/26/21 2258 07/27/21 0255      Subjective  The patient is sleeping soundly. No acute distress.   Objective   Vitals:  Vitals:   07/31/21 1153 07/31/21 1605  BP: 114/78 117/76  Pulse: 78 67  Resp: 16 17  Temp: 98.3 F (36.8 C) 98.3 F (36.8 C)  SpO2: 99% 100%    Exam:  Constitutional:  The patient is awake and alert. No acute distress. Respiratory:  No increased work of  breathing. No wheezes, rales, or rhonchi No tactile fremitus Cardiovascular:  Regular rate and rhythm No murmurs, ectopy, or gallups. No lateral PMI. No thrills. Abdomen:  Abdomen is soft, non-tender, non-distended No hernias, masses, or organomegaly Normoactive bowel sounds.  Musculoskeletal:  No cyanosis, clubbing, or edema Skin:  No rashes, lesions, ulcers palpation of skin: no induration or nodules Neurologic:  Pt is unable to cooperate with exam. Psychiatric:  Pt is unable to cooperate with exam.   I have personally reviewed the following:   Today's Data  Vitals  Lab Data  BMP, CBC, ESR  Micro Data  2/2 Blood cultures + for MRSA 07/26/2021 0/2 growth in blood cultures x 2 07/28/2021  Imaging  MRI pelvis  Cardiology Data  EKG Echocardiogram TEE pending  Scheduled Meds:  amoxicillin-clavulanate  1 tablet Oral Q12H   baclofen  10 mg Oral Daily   And   baclofen  20 mg Oral QHS   Chlorhexidine Gluconate Cloth  6 each Topical Daily   docusate sodium  100 mg Oral BID   folic acid  1 mg Oral Daily   multivitamin with minerals  1 tablet Oral Daily   thiamine  100 mg Oral Daily   Or   thiamine  100 mg Intravenous Daily   Continuous Infusions:  sodium chloride     vancomycin 1,000 mg (07/31/21 1010)    Principal Problem:   Severe anemia Active Problems:   Acute blood loss anemia   Substance abuse (HCC)   Pressure injury of skin   Sacral osteomyelitis (HCC)   LOS: 4 days   A & P  Severe anemia likely from acute blood loss likely from the sacral wound for which patient is receiving 3 units of PRBC transfusion.  Follow CBC after transfusion.  Any further units of blood transfusion will require platelets and FFP.  At the time of my exam patient's sacral wound appears to have stopped bleeding.  We will also have to look into other source of bleeding. SIRS likely from anemia and possible infection of the sacral wound and also drug-related. MRSA bacteremia.  2/2 blood cultures from 07/26/2021 have grown out MRSA. Infectious disease has been consulted. Repeat BC from 07/28/2021 have shown no growth. Pt is on cefepime and vancomycin. TTE didn't demonstrate endocarditis. TEE is pending.  Sacral osteomyelitis. Confirmed on MRI. Dr. Montel Culver had requested orthopedic surgery consult for bone biopsy of sacrum for culture and pathology. I have discussed the patient  with Dr. Mardelle Matte, who does not feel that this is indicated. Dr. Mardelle Matte has however recommended a flap closure for the sacrum and has recommended plastics for this.  On vancomycin for MRSA. Follow cultures.  I appreciate the input from infectious disease and orthopedic surgery.  Wound care has also been consulted. Acute encephalopathy suspect likely drug related.  Mental status is improving after admitting to the ER.  CT head is unremarkable. Polysubstance abuse patient has admitted to taking meth.  On CIWA at this time. Patient has multiple decubitus ulcers and there is also some swelling on the left and right ankle for which I have ordered x-rays. Wound care has been consulted. History of C-spine injury with quadriplegia.  I have seen and examined this patient myself. I have spent 32 minutes in his evaluation and care.   Since patient has severe anemia with SIRS/sepsis with osteomyelitis will need close monitoring for any further worsening inpatient status.   DVT prophylaxis: SCDs.  Avoiding anticoagulation in the setting of severe anemia and blood loss. Code Status: Full code. Family Communication: None available Disposition Plan: To be determined. Consults called: Wound team. Admission status: Inpatient.   Livvy Spilman, DO [facility] Direct contact: see [email or web address]  7PM-7AM contact night coverage as above 07/31/2021, 17:58 PM  LOS: 1 day

## 2021-07-31 NOTE — TOC Progression Note (Signed)
Transition of Care [facility]) - Progression Note    Patient Details  Name: ZYMIER RODGERS MRN: [id number] Date of Birth: April 21, 1991  Transition of [facility] CM/SW Contact  Bess Kinds, RN Phone Number: [phone] 07/31/2021, 5:22 PM  Clinical Narrative:     Received call back from [place] at [facility]. Jerrel Ivory was returning call for primary care social worker, Bo Merino, [phone], ext [phone]. Patient's primary care is at the [facility] with Dr. York Cerise, fax [phone].   Referral to [facility] in anticipation of home IV antibiotics. TOC to follow up for home health RN.   TOC following for transition needs.        Expected Discharge Plan and Services                                                 Social Determinants of Health (SDOH) Interventions    Readmission Risk Interventions No flowsheet data found.

## 2021-08-01 ENCOUNTER — Encounter (HOSPITAL_COMMUNITY)
Admission: EM | Discharge status: Against Medical Advice | Payer: Self-pay | Source: Home / Self Care | Attending: Internal Medicine

## 2021-08-01 ENCOUNTER — Inpatient Hospital Stay (HOSPITAL_COMMUNITY): Payer: No Typology Code available for payment source | Admitting: Anesthesiology

## 2021-08-01 ENCOUNTER — Inpatient Hospital Stay (HOSPITAL_COMMUNITY): Payer: No Typology Code available for payment source

## 2021-08-01 DIAGNOSIS — R7881 Bacteremia: Secondary | ICD-10-CM

## 2021-08-01 HISTORY — PX: TEE WITHOUT CARDIOVERSION: SHX5443

## 2021-08-01 LAB — GLUCOSE, CAPILLARY
Glucose-Capillary: 110 mg/dL — ABNORMAL HIGH (ref 70–99)
Glucose-Capillary: 122 mg/dL — ABNORMAL HIGH (ref 70–99)
Glucose-Capillary: 91 mg/dL (ref 70–99)
Glucose-Capillary: 98 mg/dL (ref 70–99)

## 2021-08-01 SURGERY — ECHOCARDIOGRAM, TRANSESOPHAGEAL
Anesthesia: Monitor Anesthesia Care

## 2021-08-01 MED ORDER — SODIUM CHLORIDE 0.9 % IV SOLN
8.0000 mg/kg | Freq: Every day | INTRAVENOUS | Status: DC
  Administered 2021-08-01 – 2021-08-02 (×2): 600 mg via INTRAVENOUS
  Filled 2021-08-01 (×2): qty 12

## 2021-08-01 MED ORDER — PROPOFOL 500 MG/50ML IV EMUL
INTRAVENOUS | Status: DC | PRN
  Administered 2021-08-01: 100 ug/kg/min via INTRAVENOUS

## 2021-08-01 MED ORDER — SODIUM CHLORIDE 0.9 % IV SOLN: INTRAVENOUS | Status: DC

## 2021-08-01 NOTE — Progress Notes (Signed)
Pt was not on the unit for the mid-day cbg check,

## 2021-08-01 NOTE — Anesthesia Procedure Notes (Signed)
Procedure Name: MAC Date/Time: 08/01/2021 1:45 PM Performed by: Griffin Dakin, CRNA Pre-anesthesia Checklist: Emergency Drugs available, Patient identified, Suction available, Patient being monitored and Timeout performed Patient Re-evaluated:Patient Re-evaluated prior to induction Oxygen Delivery Method: Nasal cannula Induction Type: IV induction Placement Confirmation: positive ETCO2 and breath sounds checked- equal and bilateral Dental Injury: Teeth and Oropharynx as per pre-operative assessment

## 2021-08-01 NOTE — Interval H&P Note (Signed)
History and Physical Interval Note:  08/01/2021 1:39 PM  Steve [name]  has presented today for surgery, with the diagnosis of BACTEREMIA.  The various methods of treatment have been discussed with the patient and family. After consideration of risks, benefits and other options for treatment, the patient has consented to  Procedure(s): TRANSESOPHAGEAL ECHOCARDIOGRAM (TEE) (N/A) as a surgical intervention.  The patient's history has been reviewed, patient examined, no change in status, stable for surgery.  I have reviewed the patient's chart and labs.  Questions were answered to the patient's satisfaction.     Chrystie Nose

## 2021-08-01 NOTE — CV Procedure (Signed)
   TRANSESOPHAGEAL ECHOCARDIOGRAM (TEE) NOTE  INDICATIONS: infective endocarditis  PROCEDURE:   Informed consent was obtained prior to the procedure. The risks, benefits and alternatives for the procedure were discussed and the patient comprehended these risks.  Risks include, but are not limited to, cough, sore throat, vomiting, nausea, somnolence, esophageal and stomach trauma or perforation, bleeding, low blood pressure, aspiration, pneumonia, infection, trauma to the teeth and death.    After a procedural time-out, the patient was given propofol for sedation.  The patient's heart rate, blood pressure, and oxygen saturation are monitored continuously during the procedure. The transesophageal probe was inserted in the esophagus and stomach without difficulty and multiple views were obtained.  The patient was kept under observation until the patient left the procedure room. I was present face-to-face 100% of this time. The patient left the procedure room in stable condition.   Agitated microbubble saline contrast was not administered.  COMPLICATIONS:    There were no immediate complications.  Findings:  LEFT VENTRICLE: The left ventricular wall thickness is normal.  The left ventricular cavity is normal in size. Wall motion is normal.  LVEF is 55-60%.  RIGHT VENTRICLE:  The right ventricle is normal in structure and function without any thrombus or masses.    LEFT ATRIUM:  The left atrium is normal in size without any thrombus or masses.  There is not spontaneous echo contrast ("smoke") in the left atrium consistent with a low flow state.  LEFT ATRIAL APPENDAGE:  The left atrial appendage is free of any thrombus or masses. The appendage has single lobes. Pulse doppler indicates high flow in the appendage.  ATRIAL SEPTUM:  The atrial septum appears intact and is free of thrombus and/or masses.  There is no evidence for interatrial shunting by color doppler.  RIGHT ATRIUM:  The right  atrium is normal in size and function without any thrombus or masses.  MITRAL VALVE:  The mitral valve is normal in structure and function with  no  regurgitation.  There were no vegetations or stenosis.  AORTIC VALVE:  The aortic valve is trileaflet, normal in structure and function with  no  regurgitation.  There were no vegetations or stenosis  TRICUSPID VALVE:  The tricuspid valve is normal in structure and function with  trivial  regurgitation.  There were no vegetations or stenosis   PULMONIC VALVE:  The pulmonic valve is normal in structure and function with  no  regurgitation.  There were no vegetations or stenosis.   AORTIC ARCH, ASCENDING AND DESCENDING AORTA:  There was no Myrtis Ser et. Al, 1992) atherosclerosis of the ascending aorta, aortic arch, or proximal descending aorta.  12. PULMONARY VEINS: Anomalous pulmonary venous return was not noted.  13. PERICARDIUM: The pericardium appeared normal and non-thickened.  There is no pericardial effusion.  IMPRESSION:   No endocarditis LVEF 55-60%, normal wall motion  RECOMMENDATIONS:    Antibiotics for MRSA bacteremia without endocarditis.  Time Spent Directly with the Patient:  45 minutes   Chrystie Nose, MD, [place], FACP  [place]  [facility]  Medical Director of the [facility]  [facility] Diplomate of the American Board of Clinical Lipidology Attending Cardiologist  Direct Dial: [phone]  Fax: [phone]  Website:  [email or web address] Nicely Dawon Troop 08/01/2021, 2:08 PM

## 2021-08-01 NOTE — Transfer of Care (Signed)
Immediate Anesthesia Transfer of Care Note  Patient: Steve [name]  Procedure(s) Performed: TRANSESOPHAGEAL ECHOCARDIOGRAM (TEE)  Patient Location: Endoscopy Unit  Anesthesia Type:MAC  Level of Consciousness: awake, alert  and oriented  Airway & Oxygen Therapy: Patient Spontanous Breathing and Patient connected to nasal cannula oxygen  Post-op Assessment: Report given to RN and Post -op Vital signs reviewed and stable  Post vital signs: Reviewed and stable  Last Vitals:  Vitals Value Taken Time  BP    Temp    Pulse    Resp    SpO2      Last Pain:  Vitals:   08/01/21 1210  TempSrc: Other (Comment)  PainSc:       Patients Stated Pain Goal: 0 (82/51/89 8421)  Complications: No notable events documented.

## 2021-08-01 NOTE — Progress Notes (Signed)
Pt stated, "I want the coffee on the table so I can smell it." Nurse educated pt on order of NPO before procedure and pt states, I will not drink any and I want coffee to stay on table. Will continue to monitor.

## 2021-08-01 NOTE — Progress Notes (Signed)
  Echocardiogram Echocardiogram Transesophageal has been performed.  Augustine Radar 08/01/2021, 2:27 PM

## 2021-08-01 NOTE — Progress Notes (Deleted)
  Echocardiogram 2D Echocardiogram has been performed.  Steve [name] 08/01/2021, 2:26 PM

## 2021-08-01 NOTE — Progress Notes (Signed)
[facility] Infectious Diseases Follow Up Note  Patient Identification: Patient Name: Steve [name] MRN: [id number] Admit Date: 07/26/2021 10:48 PM Age: 30 y.o.Today's Date: 08/01/2021   Reason for Visit: MRSA bacteremia/Osteomyelitis   Principal Problem:   Severe anemia Active Problems:   Acute blood loss anemia   Substance abuse (HCC)   Pressure injury of skin   Sacral osteomyelitis (HCC)  Antibiotics: Vancomycin 11/9-current                     Cefepime 11/9 - 11/11                    Metronidazole 11/9-11/12   Lines/Hardware: cervical fusion hardaware    Interval Events: remains afebrile, no leukocytosis. Repeat blood cx negative in 4 days. 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; Chronic, non infected with pink granulation tissue Acute left ischial tuberosity Osteomyelitis - no plans for biopsy for path and cultures  Multiple healing wounds in the Left elbow, left forearm, Left lateral and RT medial malleolus Acute anemia in the setting of bleeding wounds  Recent Pseudomonas putida bacteremia Polysubstance abuse   Recommendations Continue Vancomycin, pharmacy to dose; OK to switch to Daptomycin if cost is not an issue Continue Augmentin as is Final abtx recommendations to follow pending final TEE reports Monitor CBC, BMP and  Vancomycin trough  Continue wound care  Following   Rest of the management as per the primary team. Thank you for the consult. Please page with pertinent questions or concerns.  ______________________________________________________________________ Subjective Physical exam  Patient is sleeping in bed, lights closed and covering his entire body with the blanket. I attempted to talk to the patient, he said he would like to sleep at this time.  He appears  comfortable otherwise.   Vitals BP (!) 114/57   Pulse 67   Temp 98 F (36.7 C) (Oral)   Resp 16   Ht 6' (1.829 m)   Wt 75 kg   SpO2 100%   BMI 22.42 kg/m    Pertinent Microbiology Results for orders placed or performed during the hospital encounter of 07/26/21  Resp Panel by RT-PCR (Flu A&B, Covid) Nasopharyngeal Swab     Status: None   Collection Time: 07/26/21 10:58 PM   Specimen: Nasopharyngeal Swab; Nasopharyngeal(NP) swabs in vial transport medium  Result Value Ref Range Status   SARS Coronavirus 2 by RT PCR NEGATIVE NEGATIVE Final    Comment: (NOTE) SARS-CoV-2 target nucleic acids are NOT DETECTED.  The SARS-CoV-2 RNA is generally detectable in upper respiratory specimens during the acute phase of infection. The lowest concentration of SARS-CoV-2 viral copies this assay can detect is 138 copies/mL. A negative result does not preclude SARS-Cov-2 infection and should not be used as the sole basis for treatment or other patient management decisions. A negative result may occur with  improper specimen collection/handling, submission of specimen other than nasopharyngeal swab, presence of viral mutation(s) within the areas targeted by this assay, and inadequate number of viral copies(<138 copies/mL). A negative result must be combined with clinical observations, patient history, and epidemiological information. The expected result is Negative.  Fact Sheet for Patients:  BloggerCourse.com  Fact Sheet for Healthcare Providers:  SeriousBroker.it  This test is no t yet approved or cleared by the [place] FDA and  has been authorized for detection and/or  diagnosis of SARS-CoV-2 by FDA under an Emergency Use Authorization (EUA). This EUA will remain  in effect (meaning this test can be used) for the duration of the COVID-19 declaration under Section 564(b)(1) of the Act, 21 U.S.C.section 360bbb-3(b)(1), unless the  authorization is terminated  or revoked sooner.       Influenza A by PCR NEGATIVE NEGATIVE Final   Influenza B by PCR NEGATIVE NEGATIVE Final    Comment: (NOTE) The Xpert Xpress SARS-CoV-2/FLU/RSV plus assay is intended as an aid in the diagnosis of influenza from Nasopharyngeal swab specimens and should not be used as a sole basis for treatment. Nasal washings and aspirates are unacceptable for Xpert Xpress SARS-CoV-2/FLU/RSV testing.  Fact Sheet for Patients: EntrepreneurPulse.com.au  Fact Sheet for Healthcare Providers: IncredibleEmployment.be  This test is not yet approved or cleared by the [place] FDA and has been authorized for detection and/or diagnosis of SARS-CoV-2 by FDA under an Emergency Use Authorization (EUA). This EUA will remain in effect (meaning this test can be used) for the duration of the COVID-19 declaration under Section 564(b)(1) of the Act, 21 U.S.C. section 360bbb-3(b)(1), unless the authorization is terminated or revoked.  Performed at [facility], [place] [address] [place], [place] [address]   Urine Culture     Status: Abnormal   Collection Time: 07/26/21 10:58 PM   Specimen: In/Out Cath Urine  Result Value Ref Range Status   Specimen Description IN/OUT CATH URINE  Final   Special Requests   Final    NONE Performed at [facility], [place] [address], [place], [place] [address]    Culture MULTIPLE SPECIES PRESENT, SUGGEST RECOLLECTION (A)  Final   Report Status 07/27/2021 FINAL  Final  Blood Culture (routine x 2)     Status: Abnormal   Collection Time: 07/26/21 11:05 PM   Specimen: BLOOD  Result Value Ref Range Status   Specimen Description BLOOD RIGHT ANTECUBITAL  Final   Special Requests   Final    BOTTLES DRAWN AEROBIC AND ANAEROBIC Blood Culture adequate volume   Culture  Setup Time   Final    GRAM POSITIVE COCCI IN CLUSTERS IN BOTH AEROBIC AND ANAEROBIC BOTTLES CRITICAL VALUE NOTED.   VALUE IS CONSISTENT WITH PREVIOUSLY REPORTED AND CALLED VALUE.    Culture (A)  Final    STAPHYLOCOCCUS AUREUS SUSCEPTIBILITIES PERFORMED ON PREVIOUS CULTURE WITHIN THE LAST 5 DAYS. Performed at [facility], [place] [address], [place], [place] [address]    Report Status 07/30/2021 FINAL  Final  Blood Culture (routine x 2)     Status: Abnormal   Collection Time: 07/26/21 11:15 PM   Specimen: BLOOD RIGHT WRIST  Result Value Ref Range Status   Specimen Description BLOOD RIGHT WRIST  Final   Special Requests   Final    BOTTLES DRAWN AEROBIC AND ANAEROBIC Blood Culture adequate volume   Culture  Setup Time   Final    GRAM POSITIVE COCCI IN CLUSTERS AEROBIC BOTTLE ONLY CRITICAL RESULT CALLED TO, READ BACK BY AND VERIFIED WITH: V BRYK,PHARMD@0553  07/28/21 [place]    Culture (A)  Final    METHICILLIN RESISTANT STAPHYLOCOCCUS AUREUS Two isolates with different morphologies were identified as the same organism.The most resistant organism was reported. Performed at [facility], [place] [address] [place],  [address]    Report Status 07/30/2021 FINAL  Final   Organism ID, Bacteria METHICILLIN RESISTANT STAPHYLOCOCCUS AUREUS  Final      Susceptibility   Methicillin resistant  staphylococcus aureus - MIC*    CIPROFLOXACIN >=8 RESISTANT Resistant     ERYTHROMYCIN >=8 RESISTANT Resistant     GENTAMICIN >=16 RESISTANT Resistant     OXACILLIN >=4 RESISTANT Resistant     TETRACYCLINE >=16 RESISTANT Resistant     VANCOMYCIN 1 SENSITIVE Sensitive     TRIMETH/SULFA <=10 SENSITIVE Sensitive     CLINDAMYCIN >=8 RESISTANT Resistant     RIFAMPIN <=0.5 SENSITIVE Sensitive     Inducible Clindamycin NEGATIVE Sensitive     * METHICILLIN RESISTANT STAPHYLOCOCCUS AUREUS  Blood Culture ID Panel (Reflexed)     Status: Abnormal   Collection Time: 07/26/21 11:15 PM  Result Value Ref Range Status   Enterococcus faecalis NOT DETECTED NOT DETECTED Final   Enterococcus Faecium NOT DETECTED NOT DETECTED  Final   Listeria monocytogenes NOT DETECTED NOT DETECTED Final   Staphylococcus species DETECTED (A) NOT DETECTED Final    Comment: CRITICAL RESULT CALLED TO, READ BACK BY AND VERIFIED WITH: V BRYK,PHARMD@0553  07/28/21 [place]    Staphylococcus aureus (BCID) DETECTED (A) NOT DETECTED Final    Comment: Methicillin (oxacillin)-resistant Staphylococcus aureus (MRSA). MRSA is predictably resistant to beta-lactam antibiotics (except ceftaroline). Preferred therapy is vancomycin unless clinically contraindicated. Patient requires contact precautions if  hospitalized. CRITICAL RESULT CALLED TO, READ BACK BY AND VERIFIED WITH: V BYRK,PHARMD@0554  07/28/21 [place]    Staphylococcus epidermidis NOT DETECTED NOT DETECTED Final   Staphylococcus lugdunensis NOT DETECTED NOT DETECTED Final   Streptococcus species NOT DETECTED NOT DETECTED Final   Streptococcus agalactiae NOT DETECTED NOT DETECTED Final   Streptococcus pneumoniae NOT DETECTED NOT DETECTED Final   Streptococcus pyogenes NOT DETECTED NOT DETECTED Final   A.calcoaceticus-baumannii NOT DETECTED NOT DETECTED Final   Bacteroides fragilis NOT DETECTED NOT DETECTED Final   Enterobacterales NOT DETECTED NOT DETECTED Final   Enterobacter cloacae complex NOT DETECTED NOT DETECTED Final   Escherichia coli NOT DETECTED NOT DETECTED Final   Klebsiella aerogenes NOT DETECTED NOT DETECTED Final   Klebsiella oxytoca NOT DETECTED NOT DETECTED Final   Klebsiella pneumoniae NOT DETECTED NOT DETECTED Final   Proteus species NOT DETECTED NOT DETECTED Final   Salmonella species NOT DETECTED NOT DETECTED Final   Serratia marcescens NOT DETECTED NOT DETECTED Final   Haemophilus influenzae NOT DETECTED NOT DETECTED Final   Neisseria meningitidis NOT DETECTED NOT DETECTED Final   Pseudomonas aeruginosa NOT DETECTED NOT DETECTED Final   Stenotrophomonas maltophilia NOT DETECTED NOT DETECTED Final   Candida albicans NOT DETECTED NOT DETECTED Final   Candida auris NOT  DETECTED NOT DETECTED Final   Candida glabrata NOT DETECTED NOT DETECTED Final   Candida krusei NOT DETECTED NOT DETECTED Final   Candida parapsilosis NOT DETECTED NOT DETECTED Final   Candida tropicalis NOT DETECTED NOT DETECTED Final   Cryptococcus neoformans/gattii NOT DETECTED NOT DETECTED Final   Meth resistant mecA/C and MREJ DETECTED (A) NOT DETECTED Final    Comment: CRITICAL RESULT CALLED TO, READ BACK BY AND VERIFIED WITH: V BRYK,PHARMD@0553  07/28/21 [place] Performed at [facility], [address] [place], [place] [address]   MRSA Next Gen by PCR, Nasal     Status: Abnormal   Collection Time: 07/27/21  5:35 PM   Specimen: Nasal Mucosa; Nasal Swab  Result Value Ref Range Status   MRSA by PCR Next Gen DETECTED (A) NOT DETECTED Final    Comment: RESULT CALLED TO, READ BACK BY AND VERIFIED WITH: CAROLINE NJOROGE RN 07/27/2021 @2035  BY JW (NOTE) The GeneXpert MRSA Assay (FDA approved for  NASAL specimens only), is one component of a comprehensive MRSA colonization surveillance program. It is not intended to diagnose MRSA infection nor to guide or monitor treatment for MRSA infections. Test performance is not FDA approved in patients less than 89 years old. Performed at [facility], [place] [address] [place], [place] [address]   Culture, blood (routine x 2)     Status: None (Preliminary result)   Collection Time: 07/28/21  8:43 AM   Specimen: BLOOD  Result Value Ref Range Status   Specimen Description BLOOD LEFT ANTECUBITAL  Final   Special Requests   Final    BOTTLES DRAWN AEROBIC AND ANAEROBIC Blood Culture adequate volume   Culture   Final    NO GROWTH 3 DAYS Performed at [facility], [place] [address], [place], [place] [address]    Report Status PENDING  Incomplete  Culture, blood (routine x 2)     Status: None (Preliminary result)   Collection Time: 07/28/21  8:49 AM   Specimen: BLOOD LEFT HAND  Result Value Ref Range Status   Specimen Description BLOOD  LEFT HAND  Final   Special Requests   Final    BOTTLES DRAWN AEROBIC AND ANAEROBIC Blood Culture adequate volume   Culture   Final    NO GROWTH 3 DAYS Performed at [facility], [place] [address], [place], [place] [address]    Report Status PENDING  Incomplete    Pertinent Lab. CBC Latest Ref Rng & Units 07/30/2021 07/28/2021 07/27/2021  WBC 4.0 - 10.5 K/uL 3.4(L) 5.3 5.6  Hemoglobin 13.0 - 17.0 g/dL 10.3(L) 10.6(L) 10.0(L)  Hematocrit 39.0 - 52.0 % 33.3(L) 33.8(L) 31.1(L)  Platelets 150 - 400 K/uL 370 341 321   CMP Latest Ref Rng & Units 07/30/2021 07/28/2021 07/27/2021  Glucose 70 - 99 mg/dL 98 85 117(H)  BUN 6 - 20 mg/dL 5(L) <5(L) 8  Creatinine 0.61 - 1.24 mg/dL 0.66 0.59(L) 0.76  Sodium 135 - 145 mmol/L 136 136 131(L)  Potassium 3.5 - 5.1 mmol/L 4.1 4.1 3.7  Chloride 98 - 111 mmol/L 102 104 100  CO2 22 - 32 mmol/L 28 26 21(L)  Calcium 8.9 - 10.3 mg/dL 8.2(L) 8.3(L) 8.0(L)  Total Protein 6.5 - 8.1 g/dL - 6.2(L) 6.4(L)  Total Bilirubin 0.3 - 1.2 mg/dL - 0.4 0.3  Alkaline Phos 38 - 126 U/L - 57 60  AST 15 - 41 U/L - 10(L) 16  ALT 0 - 44 U/L - 9 8     Pertinent Imaging today Plain films and CT images have been personally visualized and interpreted; radiology reports have been reviewed. Decision making incorporated into the Impression / Recommendations.  07/31/21 Left ventricular ejection fraction, by estimation, is 55 to 60%. The left ventricle has normal function. The left ventricle has no regional wall motion abnormalities. Left ventricular diastolic parameters were normal. 2. Right ventricular systolic function is normal. The right ventricular size is normal. 3. The mitral valve is normal in structure. No evidence of mitral valve regurgitation. The aortic valve is normal in structure. Aortic valve regurgitation is not visualized. No aortic stenosis is present. 4.The inferior vena cava is normal in size with greater than 50% respiratory variability, suggesting  right atrial pressure of 3 mmHg. 5.\Comparison(s): No prior Echocardiogram. Conclusion(s)/Recommendation(s): Normal biventricular function without evidence of hemodynamically significant valvular heart  I spent more than 35 minutes for this patient encounter including review of prior medical records, coordination of care  with greater than 50%  of time being face to face/counseling and discussing diagnostics/treatment plan with the patient/family.  Electronically signed by:   Rosiland Oz, MD Infectious Disease Physician [facility] Pager: [phone]

## 2021-08-01 NOTE — Anesthesia Preprocedure Evaluation (Signed)
Anesthesia Evaluation  Patient identified by MRN, date of birth, ID band Patient awake    Reviewed: Allergy & Precautions, H&P , NPO status , Patient's Chart, lab work & pertinent test results  Airway Mallampati: II   Neck ROM: full    Dental   Pulmonary Current Smoker,    breath sounds clear to auscultation       Cardiovascular negative cardio ROS   Rhythm:regular Rate:Normal     Neuro/Psych PSYCHIATRIC DISORDERS Anxiety Depression Schizophrenia    GI/Hepatic   Endo/Other    Renal/GU      Musculoskeletal   Abdominal   Peds  Hematology  (+) Blood dyscrasia, anemia ,   Anesthesia Other Findings   Reproductive/Obstetrics                             Anesthesia Physical Anesthesia Plan  ASA: 2  Anesthesia Plan: MAC   Post-op Pain Management:    Induction: Intravenous  PONV Risk Score and Plan: 0 and Propofol infusion and Treatment may vary due to age or medical condition  Airway Management Planned: Nasal Cannula  Additional Equipment:   Intra-op Plan:   Post-operative Plan:   Informed Consent: I have reviewed the patients History and Physical, chart, labs and discussed the procedure including the risks, benefits and alternatives for the proposed anesthesia with the patient or authorized representative who has indicated his/her understanding and acceptance.     Dental advisory given  Plan Discussed with: CRNA, Anesthesiologist and Surgeon  Anesthesia Plan Comments:         Anesthesia Quick Evaluation

## 2021-08-01 NOTE — Progress Notes (Signed)
PROGRESS NOTE  Steve [name] OHY:[id number] DOB: 09-05-1991 DOA: 07/26/2021 PCP: [facility]  Brief History   The patient is a 30 yr old man who presented to [facility] ED with complaints of confusion and bleeding from his left second toe which had an ulcer. The patient had been using methamphetamine.    The patient has a medical history significant for sacral decubitus ulcer with osteomyelitis diagnosed in 05/2021, multiple decubitus ulcers including a large left buttock ulcer. He is a quadriplegic following a C spine injury. He also has anxiety, paranoid schizophrenia, and PTSD.    In the ED the patient was found to have a hemoglobin of 4.8 upon presentation. He received 3 units PRBC's in the ED. He was found to be bleeding not only from the toe, but also his chronic sacral wound. The patient was found to have a lactic acid of 2.0. WBC was 9.7. Hemoglobin following transfusion was 10.0.  Yesterday the patient refused MRI. We will try again with Ativan. He has had 1/2 BC grow out MRSA. TTE has been ordered, ID has been consulted.   ID had recommended Biopsy of the sacral bone for bone culture and pathology. Dr. Mardelle Matte of orthopedic surgery was consulted. He didn't feel that the procedure was indicated. ID now recommends only continuation of vancomycin. ESR is 65.  The patient will go for TEE today. Consultants  Infectious disease Wound care  Procedures  None  Antibiotics   Anti-infectives (From admission, onward)    Start     Dose/Rate Route Frequency Ordered Stop   07/31/21 1100  vancomycin (VANCOREADY) IVPB 1000 mg/200 mL        1,000 mg 200 mL/hr over 60 Minutes Intravenous Every 12 hours 07/30/21 2246     07/31/21 1000  amoxicillin-clavulanate (AUGMENTIN) 875-125 MG per tablet 1 tablet        1 tablet Oral Every 12 hours 07/30/21 2133     07/28/21 2034  vancomycin (VANCOREADY) IVPB 1250 mg/250 mL  Status:  Discontinued        1,250 mg 166.7 mL/hr over 90 Minutes  Intravenous Every 12 hours 07/28/21 1122 07/30/21 2246   07/28/21 1000  metroNIDAZOLE (FLAGYL) tablet 500 mg  Status:  Discontinued        500 mg Oral Every 12 hours 07/28/21 0814 07/29/21 1321   07/27/21 1200  metroNIDAZOLE (FLAGYL) IVPB 500 mg  Status:  Discontinued        500 mg 100 mL/hr over 60 Minutes Intravenous Every 12 hours 07/27/21 0628 07/28/21 0814   07/27/21 0700  vancomycin (VANCOREADY) IVPB 1000 mg/200 mL  Status:  Discontinued        1,000 mg 200 mL/hr over 60 Minutes Intravenous Every 8 hours 07/27/21 0637 07/28/21 1122   07/27/21 0645  ceFEPIme (MAXIPIME) 2 g in sodium chloride 0.9 % 100 mL IVPB  Status:  Discontinued        2 g 200 mL/hr over 30 Minutes Intravenous Every 8 hours 07/27/21 0637 07/29/21 1321   07/27/21 0630  ceFEPIme (MAXIPIME) 2 g in sodium chloride 0.9 % 100 mL IVPB  Status:  Discontinued        2 g 200 mL/hr over 30 Minutes Intravenous  Once 07/27/21 0628 07/27/21 0632   07/27/21 0630  vancomycin (VANCOREADY) IVPB 1000 mg/200 mL  Status:  Discontinued        1,000 mg 200 mL/hr over 60 Minutes Intravenous  Once 07/27/21 9485 07/27/21 4627   07/26/21 2300  ceFEPIme (MAXIPIME) 2 g in sodium chloride 0.9 % 100 mL IVPB        2 g 200 mL/hr over 30 Minutes Intravenous  Once 07/26/21 2258 07/27/21 0008   07/26/21 2300  metroNIDAZOLE (FLAGYL) IVPB 500 mg        500 mg 100 mL/hr over 60 Minutes Intravenous  Once 07/26/21 2258 07/27/21 0037   07/26/21 2300  vancomycin (VANCOREADY) IVPB 1500 mg/300 mL        20 mg/kg  75 kg 150 mL/hr over 120 Minutes Intravenous  Once 07/26/21 2258 07/27/21 0255      Subjective  The patient is sleeping soundly. No acute distress.   Objective   Vitals:  Vitals:   08/01/21 1415 08/01/21 1425  BP: (!) 111/52 (!) 108/59  Pulse: (!) 58 94  Resp: 15 17  Temp:    SpO2: 100% 100%   Exam:  Constitutional:  The patient is awake and alert. No acute distress. Respiratory:  No increased work of breathing. No wheezes,  rales, or rhonchi No tactile fremitus Cardiovascular:  Regular rate and rhythm No murmurs, ectopy, or gallups. No lateral PMI. No thrills. Abdomen:  Abdomen is soft, non-tender, non-distended No hernias, masses, or organomegaly Normoactive bowel sounds.  Musculoskeletal:  No cyanosis, clubbing, or edema Skin:  No rashes, lesions, ulcers palpation of skin: no induration or nodules Neurologic:  Pt is unable to cooperate with exam. Psychiatric:  Pt is unable to cooperate with exam.  I have personally reviewed the following:   Today's Data  Vitals  Lab Data    Micro Data  2/2 Blood cultures + for MRSA 07/26/2021 0/2 growth in blood cultures x 2 07/28/2021  Imaging  MRI pelvis  Cardiology Data  EKG Echocardiogram TEE pending  Scheduled Meds:  amoxicillin-clavulanate  1 tablet Oral Q12H   baclofen  10 mg Oral Daily   And   baclofen  20 mg Oral QHS   Chlorhexidine Gluconate Cloth  6 each Topical Daily   docusate sodium  100 mg Oral BID   folic acid  1 mg Oral Daily   multivitamin with minerals  1 tablet Oral Daily   thiamine  100 mg Oral Daily   Or   thiamine  100 mg Intravenous Daily   Continuous Infusions:  sodium chloride     vancomycin 1,000 mg (08/01/21 1013)    Principal Problem:   Severe anemia Active Problems:   Acute blood loss anemia   Substance abuse (HCC)   Pressure injury of skin   Sacral osteomyelitis (HCC)   LOS: 5 days   A & P  Severe anemia likely from acute blood loss likely from the sacral wound for which patient is receiving 3 units of PRBC transfusion.  Follow CBC after transfusion.  Any further units of blood transfusion will require platelets and FFP.  At the time of my exam patient's sacral wound appears to have stopped bleeding.  We will also have to look into other source of bleeding. SIRS likely from anemia and possible infection of the sacral wound and also drug-related. MRSA bacteremia. 2/2 blood cultures from 07/26/2021  have grown out MRSA. Infectious disease has been consulted. Repeat BC from 07/28/2021 have shown no growth. Pt is on cefepime and vancomycin. TTE didn't demonstrate endocarditis. TEE is pending.  Sacral osteomyelitis. Confirmed on MRI. Dr. Montel Culver had requested orthopedic surgery consult for bone biopsy of sacrum for culture and pathology. I have discussed the patient with Dr. Mardelle Matte, who does not feel  that this is indicated. Dr. Mardelle Matte has however recommended a flap closure for the sacrum and has recommended plastics for this.  On vancomycin for MRSA. Follow cultures.  I appreciate the input from infectious disease and orthopedic surgery.  Wound care has also been consulted. Acute encephalopathy suspect likely drug related.  Mental status is improving after admitting to the ER.  CT head is unremarkable. Polysubstance abuse patient has admitted to taking meth.  On CIWA at this time. Patient has multiple decubitus ulcers and there is also some swelling on the left and right ankle for which I have ordered x-rays. Wound care has been consulted. History of C-spine injury with quadriplegia.  I have seen and examined this patient myself. I have spent 28 minutes in his evaluation and care.   Since patient has severe anemia with SIRS/sepsis with osteomyelitis will need close monitoring for any further worsening inpatient status.   DVT prophylaxis: SCDs.  Avoiding anticoagulation in the setting of severe anemia and blood loss. Code Status: Full code. Family Communication: None available Disposition Plan: To be determined. Consults called: Wound team. Admission status: Inpatient.   Julias Mould, DO [facility] Direct contact: see [email or web address]  7PM-7AM contact night coverage as above 08/01/2021, 15:28 PM  LOS: 1 day

## 2021-08-02 ENCOUNTER — Other Ambulatory Visit (HOSPITAL_COMMUNITY): Payer: Self-pay

## 2021-08-02 DIAGNOSIS — D649 Anemia, unspecified: Secondary | ICD-10-CM | POA: Diagnosis not present

## 2021-08-02 LAB — GLUCOSE, CAPILLARY
Glucose-Capillary: 99 mg/dL (ref 70–99)
Glucose-Capillary: 82 mg/dL (ref 70–99)
Glucose-Capillary: 103 mg/dL — ABNORMAL HIGH (ref 70–99)

## 2021-08-02 LAB — CULTURE, BLOOD (ROUTINE X 2)
Culture: NO GROWTH
Culture: NO GROWTH
Special Requests: ADEQUATE
Special Requests: ADEQUATE

## 2021-08-02 LAB — BASIC METABOLIC PANEL
Anion gap: 6 (ref 5–15)
BUN: 9 mg/dL (ref 6–20)
CO2: 26 mmol/L (ref 22–32)
Calcium: 8.4 mg/dL — ABNORMAL LOW (ref 8.9–10.3)
Chloride: 104 mmol/L (ref 98–111)
Creatinine, Ser: 0.61 mg/dL (ref 0.61–1.24)
GFR, Estimated: >60 mL/min (ref >60)
Glucose, Bld: 96 mg/dL (ref 70–99)
Potassium: 4.3 mmol/L (ref 3.5–5.1)
Sodium: 136 mmol/L (ref 135–145)

## 2021-08-02 LAB — CK: Total CK: 31 U/L — ABNORMAL LOW (ref 49–397)

## 2021-08-02 MED ORDER — ORITAVANCIN DIPHOSPHATE 400 MG IV SOLR: 1200.0000 mg | Freq: Once | INTRAVENOUS | Status: DC

## 2021-08-02 MED ORDER — LINEZOLID 600 MG PO TABS
600.0000 mg | ORAL_TABLET | Freq: Two times a day (BID) | ORAL | 0 refills | Status: DC
  Filled 2021-08-02: qty 38, 19d supply, fill #0

## 2021-08-02 NOTE — Plan of Care (Signed)

## 2021-08-02 NOTE — Progress Notes (Signed)
PROGRESS NOTE  Phares L Bracher  DOB: 07/06/1991  PCP: [facility] MRN:[id number]  DOA: 07/26/2021  LOS: 6 days  Hospital Day: 8  Chief complaint: Confusion   Brief narrative: Steve [name] is a 30 y.o. male with PMH significant for C-spine injury leading to quadriplegia, multiple decubitus ulcers including a large left buttock ulcer, sacral ulcer with osteomyelitis who lives at home.  Also has history of anxiety, paranoid schizophrenia, PTSD and methamphetamine use. On 11/9, patient presented to ED with complaint of confusion and bleeding from his left second toe ulcer.  In the ED, patient was found to be bleeding from the left toe ulcer as well as his chronic sacral wound.  He had a hemoglobin low at 4.8, WBC 9.7, lactic acid 2. PRBC transfused. Admitted to hospital service ID and orthopedic surgery consulted. Blood culture sent on admission grew MRSA See below for details.  Subjective: Patient was seen and examined this morning.  Chronically sick looking thin built young Caucasian male.  Lying on bed.  Alert, awake, slow to respond.  Not in distress.  Assessment/Plan: MRSA bacteremia -Blood cultures in the admission grew MRSA.  Initially treated with vancomycin.  Later switched to daptomycin for better safety profile.  Not a PICC line candidate because of history of drug abuse.  -Repeat blood culture from 11/11 negative. -TEE 11/15 did not show any vegetation. -Recommendation from ID is for IV daptomycin for 2 weeks in-house since last negative blood culture on 11/11.  To be followed by 1 dose of oritavancin at the end of 2 weeks on 11/25.  At discharge, he will be provided with 2 weeks worth of linezolid to be started on the 10th day (12/14) following the dose of oritavancin. -Also, if anytime he desires to leave AMA, he can be given 1 dose of oritavancin followed by remainder course of 6 weeks with oral linezolid. -To follow-up with ID as an outpatient for CBC  monitoring -To monitor ESR and CRP every 2 weeks.  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  I requested a bone biopsy per orthopedic did not feel the procedure was indicated. -Per ID recommendation, to complete 2 weeks course of Augmentin.  Acute blood loss anemia  Anemia of chronic disease  -Hemoglobin at baseline between 9-10.  Presented with a low hemoglobin of 4.8 secondary to bleeding from decubitus ulcers.  3 units of PRBC transfused with improvement in hemoglobin to over 10. Recent Labs    05/30/21 1801 05/31/21 0410 06/01/21 0028 06/03/21 0223 07/26/21 2258 07/27/21 1230 07/28/21 0230 07/30/21 0118  HGB  --  8.1*   < > 8.6* 4.8* 10.0* 10.6* 10.3*  MCV  --  84.7   < > 86.1 89.1 83.8 83.9 86.3  VITAMINB12  --  241  --   --   --   --   --   --   FOLATE  --  6.8  --   --   --   --   --   --   TIBC 189*  --   --   --   --   --   --   --   IRON 10*  --   --   --   --   --   --   --   RETICCTPCT 0.8  --   --   --   --   --   --   --    < > =   values in this interval not displayed.   Acute encephalopathy -Mental status is impaired on admission.  Likely drug-related.  CT head was unremarkable.  Mental status stable now.  C-spine injury leading to quadriplegia, multiple decubitus ulcers -Taken care of by his wife at home.  Polysubstance abuse -Uses meth.  Positive for amphetamine on admission.   Mobility: Paraplegic Living condition: Lives at home with his wife Goals of care:   Code Status: Full Code  Nutritional status: Body mass index is 22.42 kg/m.     Diet:  Diet Order             Diet regular Room service appropriate? Yes; Fluid consistency: Thin  Diet effective now                  DVT prophylaxis:  SCDs Start: 07/27/21 6789   Antimicrobials: Currently on daptomycin, Augmentin Fluid: None Consultants: ID, orthopedics Family Communication: None  at bedside  Status is: Inpatient  Remains inpatient appropriate because: Needs IV antibiotics till 11/25  Dispo: The patient is from: Home              Anticipated d/c is to: Likely home after 11/25              Patient currently is not medically stable to d/c.   Difficult to place patient No     Infusions:   sodium chloride     DAPTOmycin (CUBICIN)  IV 600 mg (08/01/21 2035)    Scheduled Meds:  amoxicillin-clavulanate  1 tablet Oral Q12H   baclofen  10 mg Oral Daily   And   baclofen  20 mg Oral QHS   Chlorhexidine Gluconate Cloth  6 each Topical Daily   docusate sodium  100 mg Oral BID   folic acid  1 mg Oral Daily   multivitamin with minerals  1 tablet Oral Daily   thiamine  100 mg Oral Daily   Or   thiamine  100 mg Intravenous Daily    PRN meds: acetaminophen **OR** acetaminophen, diphenhydrAMINE, HYDROcodone-acetaminophen   Antimicrobials: Anti-infectives (From admission, onward)    Start     Dose/Rate Route Frequency Ordered Stop   08/01/21 2000  DAPTOmycin (CUBICIN) 600 mg in sodium chloride 0.9 % IVPB        8 mg/kg  75 kg 124 mL/hr over 30 Minutes Intravenous Daily 08/01/21 1605     07/31/21 1100  vancomycin (VANCOREADY) IVPB 1000 mg/200 mL  Status:  Discontinued        1,000 mg 200 mL/hr over 60 Minutes Intravenous Every 12 hours 07/30/21 2246 08/01/21 1605   07/31/21 1000  amoxicillin-clavulanate (AUGMENTIN) 875-125 MG per tablet 1 tablet        1 tablet Oral Every 12 hours 07/30/21 2133     07/28/21 2034  vancomycin (VANCOREADY) IVPB 1250 mg/250 mL  Status:  Discontinued        1,250 mg 166.7 mL/hr over 90 Minutes Intravenous Every 12 hours 07/28/21 1122 07/30/21 2246   07/28/21 1000  metroNIDAZOLE (FLAGYL) tablet 500 mg  Status:  Discontinued        500 mg Oral Every 12 hours 07/28/21 0814 07/29/21 1321   07/27/21 1200  metroNIDAZOLE (FLAGYL) IVPB 500 mg  Status:  Discontinued        500 mg 100 mL/hr over 60 Minutes Intravenous Every 12 hours  07/27/21 0628 07/28/21 0814   07/27/21 0700  vancomycin (VANCOREADY) IVPB 1000 mg/200 mL  Status:  Discontinued  1,000 mg 200 mL/hr over 60 Minutes Intravenous Every 8 hours 07/27/21 0637 07/28/21 1122   07/27/21 0645  ceFEPIme (MAXIPIME) 2 g in sodium chloride 0.9 % 100 mL IVPB  Status:  Discontinued        2 g 200 mL/hr over 30 Minutes Intravenous Every 8 hours 07/27/21 0637 07/29/21 1321   07/27/21 0630  ceFEPIme (MAXIPIME) 2 g in sodium chloride 0.9 % 100 mL IVPB  Status:  Discontinued        2 g 200 mL/hr over 30 Minutes Intravenous  Once 07/27/21 0628 07/27/21 0632   07/27/21 0630  vancomycin (VANCOREADY) IVPB 1000 mg/200 mL  Status:  Discontinued        1,000 mg 200 mL/hr over 60 Minutes Intravenous  Once 07/27/21 0628 07/27/21 0632   07/26/21 2300  ceFEPIme (MAXIPIME) 2 g in sodium chloride 0.9 % 100 mL IVPB        2 g 200 mL/hr over 30 Minutes Intravenous  Once 07/26/21 2258 07/27/21 0008   07/26/21 2300  metroNIDAZOLE (FLAGYL) IVPB 500 mg        500 mg 100 mL/hr over 60 Minutes Intravenous  Once 07/26/21 2258 07/27/21 0037   07/26/21 2300  vancomycin (VANCOREADY) IVPB 1500 mg/300 mL        20 mg/kg  75 kg 150 mL/hr over 120 Minutes Intravenous  Once 07/26/21 2258 07/27/21 0255       Objective: Vitals:   08/02/21 0815 08/02/21 1226  BP: 112/65 108/60  Pulse: 82 60  Resp: 15 14  Temp: (!) 97.5 F (36.4 C) 98 F (36.7 C)  SpO2: 98% 90%    Intake/Output Summary (Last 24 hours) at 08/02/2021 1305 Last data filed at 08/02/2021 1253 Gross per 24 hour  Intake 724 ml  Output 2950 ml  Net -2226 ml   Filed Weights   07/26/21 2300 08/01/21 1206 08/01/21 1600  Weight: 75 kg 22.4 kg 75 kg   Weight change:  Body mass index is 22.42 kg/m.   Physical Exam: General exam: Chronically sick looking thin built young male with paraplegia Skin: No rashes, lesions or ulcers. HEENT: Atraumatic, normocephalic, no obvious bleeding Lungs: Clear to auscultation  bilaterally CVS: Regular rate and rhythm, no murmur GI/Abd soft, nontender, nondistended, bowel sound present CNS: Alert, awake, slow to respond Psychiatry: Sad affect Extremities: No pedal edema, no calf tenderness,  Data Review: I have personally reviewed the laboratory data and studies available.  F/u labs  Unresulted Labs (From admission, onward)     Start     Ordered   08/03/21 0500  CBC with Differential/Platelet  Tomorrow morning,   R       Question:  Specimen collection method  Answer:  Lab=Lab collect   08/02/21 0844   08/03/21 0500  Basic metabolic panel  Tomorrow morning,   R       Question:  Specimen collection method  Answer:  Lab=Lab collect   08/02/21 0844   08/03/21 0500  Ferritin  Tomorrow morning,   R       Question:  Specimen collection method  Answer:  Lab=Lab collect   08/02/21 1305   08/02/21 0500  CK  Weekly,   R     Question:  Specimen collection method  Answer:  Lab=Lab collect   08/01/21 1605            Signed,  , MD [facility] 08/02/2021             

## 2021-08-02 NOTE — Progress Notes (Addendum)
[facility] Infectious Diseases Follow Up Note  Patient Identification: Patient Name: Steve [name] MRN: [id number] [place] Date: 07/26/2021 10:48 PM Age: 30 y.o.Today's Date: 08/02/2021   Reason for Visit: MRSA bacteremia/Osteomyelitis   Principal Problem:   Severe anemia Active Problems:   Acute blood loss anemia   Substance abuse ([place])   Pressure injury of skin   Sacral osteomyelitis (HCC)  Antibiotics: Vancomycin 11/9-current                     Cefepime 11/9 - 11/11                    Metronidazole 11/9-11/12   Lines/Hardware: cervical fusion hardaware    Interval Events: remains afebrile, no leukocytosis. 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 He has a h/o substance use and not a PICC line candidate for 6 weeks of IV abtx at home ( MRSA bacteremia/acute left ischial tuberosity Osteomyelitis)  Continue IV daptomycin for 2 weeks in house following negative blood cx on 11/11  followed by one dose of Oritavancin at the end of 2 weeks ( 11/25). Provide him with 2 weeks worth of Linezolid on discharge ( to be taken at 10th day following dose of oritavancin).   Complete 2 weeks course of Augmentin total for SSTI of decubitus ulcer  If he desires to leave any time, OK to give a dose of  oritavancin followed by the remainder course of 6 weeks with PO linezolid ( will also need a fu with ID if discharged on Linezolid for CBC monitoring). Discussed with ID pharmacy   Monitor CBC, BMP, CPK ESR and CRP every 2 weeks  Discussed with patient, ID pharmacy.  ID will sign off.   Rest of the management as per the primary team. Thank you for the consult. Please page with pertinent questions or concerns.  ______________________________________________________________________ Subjective Patient seen and examined at bedside. He is asking for breakfast. He tells me he used meth a while ago.    Physical exam  Patient is sleeping in bed, appears comfortable Awake alert and oriented Breathing in room air  Vitals BP 112/71 (BP Location: Left Arm)   Pulse 76   Temp 97.9 F (36.6 C)   Resp 18   Ht 6' (1.829 m)   Wt 75 kg   SpO2 100%   BMI 22.42 kg/m    Pertinent Microbiology Results for orders placed or performed during the hospital encounter of 07/26/21  Resp Panel by RT-PCR (Flu A&B, Covid) Nasopharyngeal Swab     Status: None   Collection Time: 07/26/21 10:58 PM   Specimen: Nasopharyngeal Swab; Nasopharyngeal(NP) swabs in vial transport medium  Result Value Ref Range Status   SARS Coronavirus 2 by RT PCR NEGATIVE NEGATIVE Final    Comment: (NOTE) SARS-CoV-2 target nucleic acids are NOT DETECTED.  The SARS-CoV-2 RNA is generally detectable in upper respiratory  specimens during the acute phase of infection. The lowest concentration of SARS-CoV-2 viral copies this assay can detect is 138 copies/mL. A negative result does not preclude SARS-Cov-2 infection and should not be used as the sole basis for treatment or other patient management decisions. A negative result may occur with  improper specimen collection/handling, submission of specimen other than nasopharyngeal swab, presence of viral mutation(s) within the areas targeted by this assay, and inadequate number of  viral copies(<138 copies/mL). A negative result must be combined with clinical observations, patient history, and epidemiological information. The expected result is Negative.  Fact Sheet for Patients:  EntrepreneurPulse.com.au  Fact Sheet for Healthcare Providers:  IncredibleEmployment.be  This test is no t yet approved or cleared by the [place] FDA and  has been authorized for detection and/or diagnosis of SARS-CoV-2 by FDA under an Emergency Use Authorization (EUA). This EUA will remain  in effect (meaning this test can be used) for the duration of the COVID-19 declaration under Section 564(b)(1) of the Act, 21 U.S.C.section 360bbb-3(b)(1), unless the authorization is terminated  or revoked sooner.       Influenza A by PCR NEGATIVE NEGATIVE Final   Influenza B by PCR NEGATIVE NEGATIVE Final    Comment: (NOTE) The Xpert Xpress SARS-CoV-2/FLU/RSV plus assay is intended as an aid in the diagnosis of influenza from Nasopharyngeal swab specimens and should not be used as a sole basis for treatment. Nasal washings and aspirates are unacceptable for Xpert Xpress SARS-CoV-2/FLU/RSV testing.  Fact Sheet for Patients: EntrepreneurPulse.com.au  Fact Sheet for Healthcare Providers: IncredibleEmployment.be  This test is not yet approved or cleared by the [place] FDA and has been authorized for detection and/or diagnosis of SARS-CoV-2 by FDA under an Emergency Use Authorization (EUA). This EUA will remain in effect (meaning this test can be used) for the duration of the COVID-19 declaration under Section 564(b)(1) of the Act, 21 U.S.C. section 360bbb-3(b)(1), unless the authorization is terminated or revoked.  Performed at [facility], [place] [address], [place], [place] [address]   Urine Culture     Status: Abnormal   Collection Time: 07/26/21 10:58 PM   Specimen: In/Out Cath Urine  Result  Value Ref Range Status   Specimen Description IN/OUT CATH URINE  Final   Special Requests   Final    NONE Performed at [facility], [place] [address], [place], [place] [address]    Culture MULTIPLE SPECIES PRESENT, SUGGEST RECOLLECTION (A)  Final   Report Status 07/27/2021 FINAL  Final  Blood Culture (routine x 2)     Status: Abnormal   Collection Time: 07/26/21 11:05 PM   Specimen: BLOOD  Result Value Ref Range Status   Specimen Description BLOOD RIGHT ANTECUBITAL  Final   Special Requests   Final    BOTTLES DRAWN AEROBIC AND ANAEROBIC Blood Culture adequate volume   Culture  Setup Time   Final    GRAM POSITIVE COCCI IN CLUSTERS IN BOTH AEROBIC AND ANAEROBIC BOTTLES CRITICAL VALUE NOTED.  VALUE IS CONSISTENT WITH PREVIOUSLY REPORTED AND CALLED VALUE.    Culture (A)  Final    STAPHYLOCOCCUS AUREUS SUSCEPTIBILITIES PERFORMED ON PREVIOUS CULTURE WITHIN THE LAST 5 DAYS. Performed at [facility], [place] [address], [place], [place] [address]    Report Status 07/30/2021 FINAL  Final  Blood Culture (routine x 2)     Status: Abnormal   Collection Time: 07/26/21 11:15 PM   Specimen: BLOOD RIGHT WRIST  Result Value Ref  Range Status   Specimen Description BLOOD RIGHT WRIST  Final   Special Requests   Final    BOTTLES DRAWN AEROBIC AND ANAEROBIC Blood Culture adequate volume   Culture  Setup Time   Final    GRAM POSITIVE COCCI IN CLUSTERS AEROBIC BOTTLE ONLY CRITICAL RESULT CALLED TO, READ BACK BY AND VERIFIED WITH: V BRYK,PHARMD'@0553'  07/28/21 [place]    Culture (A)  Final    METHICILLIN RESISTANT STAPHYLOCOCCUS AUREUS Two isolates with different morphologies were identified as the same organism.The most resistant organism was reported. Performed at [facility], [place] [address], [place], [place] [address]    Report Status 07/30/2021 FINAL  Final   Organism ID, Bacteria METHICILLIN RESISTANT STAPHYLOCOCCUS AUREUS  Final      Susceptibility   Methicillin resistant  staphylococcus aureus - MIC*    CIPROFLOXACIN >=8 RESISTANT Resistant     ERYTHROMYCIN >=8 RESISTANT Resistant     GENTAMICIN >=16 RESISTANT Resistant     OXACILLIN >=4 RESISTANT Resistant     TETRACYCLINE >=16 RESISTANT Resistant     VANCOMYCIN 1 SENSITIVE Sensitive     TRIMETH/SULFA <=10 SENSITIVE Sensitive     CLINDAMYCIN >=8 RESISTANT Resistant     RIFAMPIN <=0.5 SENSITIVE Sensitive     Inducible Clindamycin NEGATIVE Sensitive     * METHICILLIN RESISTANT STAPHYLOCOCCUS AUREUS  Blood Culture ID Panel (Reflexed)     Status: Abnormal   Collection Time: 07/26/21 11:15 PM  Result Value Ref Range Status   Enterococcus faecalis NOT DETECTED NOT DETECTED Final   Enterococcus Faecium NOT DETECTED NOT DETECTED Final   Listeria monocytogenes NOT DETECTED NOT DETECTED Final   Staphylococcus species DETECTED (A) NOT DETECTED Final    Comment: CRITICAL RESULT CALLED TO, READ BACK BY AND VERIFIED WITH: V BRYK,PHARMD'@0553'  07/28/21 [place]    Staphylococcus aureus (BCID) DETECTED (A) NOT DETECTED Final    Comment: Methicillin (oxacillin)-resistant Staphylococcus aureus (MRSA). MRSA is predictably resistant to beta-lactam antibiotics (except ceftaroline). Preferred therapy is vancomycin unless clinically contraindicated. Patient requires contact precautions if  hospitalized. CRITICAL RESULT CALLED TO, READ BACK BY AND VERIFIED WITH: V BYRK,PHARMD'@0554'  07/28/21 [place]    Staphylococcus epidermidis NOT DETECTED NOT DETECTED Final   Staphylococcus lugdunensis NOT DETECTED NOT DETECTED Final   Streptococcus species NOT DETECTED NOT DETECTED Final   Streptococcus agalactiae NOT DETECTED NOT DETECTED Final   Streptococcus pneumoniae NOT DETECTED NOT DETECTED Final   Streptococcus pyogenes NOT DETECTED NOT DETECTED Final   A.calcoaceticus-baumannii NOT DETECTED NOT DETECTED Final   Bacteroides fragilis NOT DETECTED NOT DETECTED Final   Enterobacterales NOT DETECTED NOT DETECTED Final   Enterobacter cloacae  complex NOT DETECTED NOT DETECTED Final   Escherichia coli NOT DETECTED NOT DETECTED Final   Klebsiella aerogenes NOT DETECTED NOT DETECTED Final   Klebsiella oxytoca NOT DETECTED NOT DETECTED Final   Klebsiella pneumoniae NOT DETECTED NOT DETECTED Final   Proteus species NOT DETECTED NOT DETECTED Final   Salmonella species NOT DETECTED NOT DETECTED Final   Serratia marcescens NOT DETECTED NOT DETECTED Final   Haemophilus influenzae NOT DETECTED NOT DETECTED Final   Neisseria meningitidis NOT DETECTED NOT DETECTED Final   Pseudomonas aeruginosa NOT DETECTED NOT DETECTED Final   Stenotrophomonas maltophilia NOT DETECTED NOT DETECTED Final   Candida albicans NOT DETECTED NOT DETECTED Final   Candida auris NOT DETECTED NOT DETECTED Final   Candida glabrata NOT DETECTED NOT DETECTED Final   Candida krusei NOT DETECTED NOT DETECTED Final   Candida parapsilosis NOT DETECTED NOT DETECTED  Final   Candida tropicalis NOT DETECTED NOT DETECTED Final   Cryptococcus neoformans/gattii NOT DETECTED NOT DETECTED Final   Meth resistant mecA/C and MREJ DETECTED (A) NOT DETECTED Final    Comment: CRITICAL RESULT CALLED TO, READ BACK BY AND VERIFIED WITH: V BRYK,PHARMD'@0553'  07/28/21 [place] Performed at [facility], [address], [place], [place] [address]   MRSA Next Gen by PCR, Nasal     Status: Abnormal   Collection Time: 07/27/21  5:35 PM   Specimen: Nasal Mucosa; Nasal Swab  Result Value Ref Range Status   MRSA by PCR Next Gen DETECTED (A) NOT DETECTED Final    Comment: RESULT CALLED TO, READ BACK BY AND VERIFIED WITH: CAROLINE NJOROGE RN 07/27/2021 '@2035'  BY JW (NOTE) The GeneXpert MRSA Assay (FDA approved for NASAL specimens only), is one component of a comprehensive MRSA colonization surveillance program. It is not intended to diagnose MRSA infection nor to guide or monitor treatment for MRSA infections. Test performance is not FDA approved in patients less than 72 years old. Performed at  [facility], [place] [address] [place], [place] [address]   Culture, blood (routine x 2)     Status: None (Preliminary result)   Collection Time: 07/28/21  8:43 AM   Specimen: BLOOD  Result Value Ref Range Status   Specimen Description BLOOD LEFT ANTECUBITAL  Final   Special Requests   Final    BOTTLES DRAWN AEROBIC AND ANAEROBIC Blood Culture adequate volume   Culture   Final    NO GROWTH 4 DAYS Performed at [facility], [place] [address] [place], [place] [address]    Report Status PENDING  Incomplete  Culture, blood (routine x 2)     Status: None (Preliminary result)   Collection Time: 07/28/21  8:49 AM   Specimen: BLOOD LEFT HAND  Result Value Ref Range Status   Specimen Description BLOOD LEFT HAND  Final   Special Requests   Final    BOTTLES DRAWN AEROBIC AND ANAEROBIC Blood Culture adequate volume   Culture   Final    NO GROWTH 4 DAYS Performed at [facility], [place] [address] [place], [place] [address]    Report Status PENDING  Incomplete    Pertinent Lab. CBC Latest Ref Rng & Units 07/30/2021 07/28/2021 07/27/2021  WBC 4.0 - 10.5 K/uL 3.4(L) 5.3 5.6  Hemoglobin 13.0 - 17.0 g/dL 10.3(L) 10.6(L) 10.0(L)  Hematocrit 39.0 - 52.0 % 33.3(L) 33.8(L) 31.1(L)  Platelets 150 - 400 K/uL 370 341 321   CMP Latest Ref Rng & Units 08/02/2021 07/30/2021 07/28/2021  Glucose 70 - 99 mg/dL 96 98 85  BUN 6 - 20 mg/dL 9 5(L) <5(L)  Creatinine 0.61 - 1.24 mg/dL 0.61 0.66 0.59(L)  Sodium 135 - 145 mmol/L 136 136 136  Potassium 3.5 - 5.1 mmol/L 4.3 4.1 4.1  Chloride 98 - 111 mmol/L 104 102 104  CO2 22 - 32 mmol/L '26 28 26  ' Calcium 8.9 - 10.3 mg/dL 8.4(L) 8.2(L) 8.3(L)  Total Protein 6.5 - 8.1 g/dL - - 6.2(L)  Total Bilirubin 0.3 - 1.2 mg/dL - - 0.4  Alkaline Phos 38 - 126 U/L - - 57  AST 15 - 41 U/L - - 10(L)  ALT 0 - 44 U/L - - 9     Pertinent Imaging today Plain films and CT images have been personally visualized and interpreted; radiology reports have been  reviewed. Decision making incorporated into the Impression / Recommendations.  08/01/21  TEE Left ventricular ejection fraction, by estimation, is 55 to 60%. The left ventricle has normal function. 1. 2. Right ventricular systolic function is normal. The right ventricular size is normal. 3. No left atrial/left atrial appendage thrombus was detected. 4. The mitral valve is normal in structure. No evidence of mitral valve regurgitation. 5. The aortic valve is tricuspid. Aortic valve regurgitation is not visualized. Conclusion(s)/Recommendation(s): No evidence of vegetation/infective endocarditis on this transesophageael echocardiogram.  I spent more than 35 minutes for this patient encounter including review of prior medical records, coordination of care  with greater than 50% of time being face to face/counseling and discussing diagnostics/treatment plan with the patient/family.  Electronically signed by:   Rosiland Oz, MD Infectious Disease Physician [facility] Pager: [phone]

## 2021-08-03 ENCOUNTER — Encounter (HOSPITAL_COMMUNITY): Payer: Self-pay | Admitting: Internal Medicine

## 2021-08-03 DIAGNOSIS — D649 Anemia, unspecified: Secondary | ICD-10-CM

## 2021-08-03 NOTE — Progress Notes (Signed)
HOSPITAL MEDICINE OVERNIGHT EVENT NOTE    Notified by nursing that patient has left AGAINST MEDICAL ADVICE.  Patient was voicing his desire to leave AGAINST MEDICAL ADVICE for the past several hours.  Nursing had attempted on multiple occasions to educate him as to the risk of morbidity and mortality with this decision as patient is suffering from MRSA bacteremia.  Despite this, patient's spouse somehow made it up to his room to pack up his things and escort him out of the building via wheelchair.  Patient did happen to sign the appropriate AGAINST MEDICAL ADVICE paperwork prior to leaving.  Marinda Elk  MD [facility]

## 2021-08-03 NOTE — Anesthesia Postprocedure Evaluation (Signed)
Anesthesia Post Note  Patient: Steve [name]  Procedure(s) Performed: TRANSESOPHAGEAL ECHOCARDIOGRAM (TEE)     Patient location during evaluation: Endoscopy Anesthesia Type: MAC Level of consciousness: awake and alert Pain management: pain level controlled Vital Signs Assessment: post-procedure vital signs reviewed and stable Respiratory status: spontaneous breathing, nonlabored ventilation, respiratory function stable and patient connected to nasal cannula oxygen Cardiovascular status: stable and blood pressure returned to baseline Postop Assessment: no apparent nausea or vomiting Anesthetic complications: no   No notable events documented.  Last Vitals:  Vitals:   08/02/21 1524 08/02/21 2117  BP: 118/61 123/77  Pulse: 63 78  Resp: 16 17  Temp: 36.7 C 36.6 C  SpO2: 100% 100%    Last Pain:  Vitals:   08/02/21 1524  TempSrc: Oral  PainSc:                  Carrine Kroboth

## 2021-08-04 ENCOUNTER — Other Ambulatory Visit (HOSPITAL_COMMUNITY): Payer: Self-pay

## 2021-08-04 NOTE — Discharge Summary (Signed)
Discharge AGAINST MEDICAL ADVICE  ANTRONE ZEH NGE:[id number] DOB: 1991-06-17 DOA: 07/26/2021  PCP: [facility]  Admit date: 07/26/2021 Discharge date: 08/04/2021 left AMA  Admitted From: Home   Code Status: Prior   Discharge Diagnosis:   Principal Problem:   Severe anemia Active Problems:   Acute blood loss anemia   Substance abuse (HCC)   Pressure injury of skin   Sacral osteomyelitis (HCC)    History of Present Illness / Brief narrative:  Please refer to progress note from 11/16 for details  Hospital Course:  Patient left AGAINST MEDICAL ADVICE in the night Acute issues with plans while admitted in the hospital as stated in progress note from 11/16.  Medical Consultants:      Discharge Exam:  Not applicable.   Discharge Instructions:   The results of significant diagnostics from this hospitalization (including imaging, microbiology, ancillary and laboratory) are listed below for reference.    Procedures and Diagnostic Studies:   DG Ankle 2 Views Left  Result Date: 07/27/2021 CLINICAL DATA:  Pain EXAM: LEFT ANKLE - 2 VIEW COMPARISON:  None. FINDINGS: There is no evidence of acute fracture. 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 There is osteopenia in the foot likely from disuse. IMPRESSION: No acute osseous abnormality. Electronically Signed   By: Caprice Renshaw M.D.   On: 07/27/2021 08:06   DG Ankle 2 Views Right  Result Date: 07/27/2021 CLINICAL DATA:  Pain EXAM: RIGHT ANKLE - 2 VIEW COMPARISON:  None. FINDINGS: There is no evidence of acute fracture. The foot is plantar flexed. Osteopenia in the foot. IMPRESSION: No acute osseous abnormality. Electronically Signed   By: Caprice Renshaw M.D.   On: 07/27/2021 08:07   CT HEAD WO CONTRAST ( )  Result Date: 07/27/2021 CLINICAL DATA:  Mental status change with unknown cause. EXAM: CT HEAD WITHOUT CONTRAST TECHNIQUE: Contiguous axial images were obtained from the base of the skull through the vertex  without intravenous contrast. COMPARISON:  None. FINDINGS: Brain: No evidence of acute infarction, hemorrhage, hydrocephalus, extra-axial collection or mass lesion/mass effect. Cerebral volume loss for age, without specific pattern. Vascular: No hyperdense vessel or unexpected calcification. Skull: Normal. Negative for fracture or focal lesion. Sinuses/Orbits: No acute finding. IMPRESSION: Stable head CT.  No acute finding. Electronically Signed   By: Tiburcio Pea M.D.   On: 07/27/2021 04:12   CT PELVIS W CONTRAST  Result Date: 07/27/2021 CLINICAL DATA:  Abscess, anal or rectal. EXAM: CT PELVIS WITH CONTRAST TECHNIQUE: Multidetector CT imaging of the pelvis was performed using the standard protocol following the bolus administration of intravenous contrast. CONTRAST:  53mL OMNIPAQUE IOHEXOL 300 MG/ML  SOLN COMPARISON:  05/30/2021. FINDINGS: Urinary Tract: A Foley catheter is present in the urinary bladder. There is mild thickening of the bladder wall which may be in part due to incomplete distension. Bowel: No bowel obstruction, free air or pneumatosis. A moderate amount of retained stool is present in the sigmoid colon and rectum. No perirectal or or anal abscess is identified, however examination is limited due to the lack of IV contrast. Vascular/Lymphatic: No pathologically enlarged lymph nodes. No significant vascular abnormality seen. Reproductive:  The prostate gland is normal in size Other: A trace amount of free fluid is noted anterior to the rectum. Musculoskeletal: There is a large left medial gluteal ulcer extending into the posterior soft tissues of the upper posterior left thigh. There is bony destruction of the ischial tuberosity on the left with multiple bony fragments in the soft tissues,  new from the previous exam. There is asymmetric enlargement of the obturator internus muscle and gluteal muscles on the left, possible myositis. Edema is noted in the adductor muscles in the medial proximal  left thigh. IMPRESSION: 1. No definite evidence of perirectal abscess. 2. Large open ulceration in the medial gluteal region on the left. There is new bony destruction of the ischial tuberosity on the left, suggesting osteomyelitis. Enlargement of the gluteal muscles and obturator internus is noted on the left, possible myositis. MRI may be beneficial for further evaluation. 3. Foley catheter in the urinary bladder. The bladder wall appears mildly thickened which may be in part due to incomplete distension. Correlate clinically to exclude infectious or inflammatory cystitis. Electronically Signed   By: Brett Fairy M.D.   On: 07/27/2021 04:30   DG Chest Port 1 View  Result Date: 07/26/2021 CLINICAL DATA:  Possible sepsis. EXAM: PORTABLE CHEST 1 VIEW COMPARISON:  02/01/2021. FINDINGS: The heart size and mediastinal contours are within normal limits. Both lungs are clear. Anterior and posterior cervical spinal fusion hardware is noted. IMPRESSION: No acute cardiopulmonary process. Electronically Signed   By: Brett Fairy M.D.   On: 07/26/2021 23:29     Labs:   Basic Metabolic Panel: Recent Labs  Lab 07/30/21 0118 08/02/21 0419  NA 136 136  K 4.1 4.3  CL 102 104  CO2 28 26  GLUCOSE 98 96  BUN 5* 9  CREATININE 0.66 0.61  CALCIUM 8.2* 8.4*   GFR Estimated Creatinine Clearance: 141.9 mL/min (by C-G formula based on SCr of 0.61 mg/dL). Liver Function Tests: No results for input(s): AST, ALT, ALKPHOS, BILITOT, PROT, ALBUMIN in the last 168 hours. No results for input(s): LIPASE, AMYLASE in the last 168 hours. No results for input(s): AMMONIA in the last 168 hours. Coagulation profile No results for input(s): INR, PROTIME in the last 168 hours.  CBC: Recent Labs  Lab 07/30/21 0118  WBC 3.4*  NEUTROABS 1.4*  HGB 10.3*  HCT 33.3*  MCV 86.3  PLT 370   Cardiac Enzymes: Recent Labs  Lab 08/02/21 0419  CKTOTAL 31*   BNP: Invalid input(s): POCBNP CBG: Recent Labs  Lab  08/01/21 2036 08/01/21 2346 08/02/21 0609 08/02/21 1211 08/02/21 1617  GLUCAP 122* 98 99 82 103*   D-Dimer No results for input(s): DDIMER in the last 72 hours. Hgb A1c No results for input(s): HGBA1C in the last 72 hours. Lipid Profile No results for input(s): CHOL, HDL, LDLCALC, TRIG, CHOLHDL, LDLDIRECT in the last 72 hours. Thyroid function studies No results for input(s): TSH, T4TOTAL, T3FREE, THYROIDAB in the last 72 hours.  Invalid input(s): FREET3 Anemia work up No results for input(s): VITAMINB12, FOLATE, FERRITIN, TIBC, IRON, RETICCTPCT in the last 72 hours. Microbiology Recent Results (from the past 240 hour(s))  Resp Panel by RT-PCR (Flu A&B, Covid) Nasopharyngeal Swab     Status: None   Collection Time: 07/26/21 10:58 PM   Specimen: Nasopharyngeal Swab; Nasopharyngeal(NP) swabs in vial transport medium  Result Value Ref Range Status   SARS Coronavirus 2 by RT PCR NEGATIVE NEGATIVE Final    Comment: (NOTE) SARS-CoV-2 target nucleic acids are NOT DETECTED.  The SARS-CoV-2 RNA is generally detectable in upper respiratory specimens during the acute phase of infection. The lowest concentration of SARS-CoV-2 viral copies this assay can detect is 138 copies/mL. A negative result does not preclude SARS-Cov-2 infection and should not be used as the sole basis for treatment or other patient management decisions. A negative result may  occur with  improper specimen collection/handling, submission of specimen other than nasopharyngeal swab, presence of viral mutation(s) within the areas targeted by this assay, and inadequate number of viral copies(<138 copies/mL). A negative result must be combined with clinical observations, patient history, and epidemiological information. The expected result is Negative.  Fact Sheet for Patients:  EntrepreneurPulse.com.au  Fact Sheet for Healthcare Providers:  IncredibleEmployment.be  This test is  no t yet approved or cleared by the [place] FDA and  has been authorized for detection and/or diagnosis of SARS-CoV-2 by FDA under an Emergency Use Authorization (EUA). This EUA will remain  in effect (meaning this test can be used) for the duration of the COVID-19 declaration under Section 564(b)(1) of the Act, 21 U.S.C.section 360bbb-3(b)(1), unless the authorization is terminated  or revoked sooner.       Influenza A by PCR NEGATIVE NEGATIVE Final   Influenza B by PCR NEGATIVE NEGATIVE Final    Comment: (NOTE) The Xpert Xpress SARS-CoV-2/FLU/RSV plus assay is intended as an aid in the diagnosis of influenza from Nasopharyngeal swab specimens and should not be used as a sole basis for treatment. Nasal washings and aspirates are unacceptable for Xpert Xpress SARS-CoV-2/FLU/RSV testing.  Fact Sheet for Patients: EntrepreneurPulse.com.au  Fact Sheet for Healthcare Providers: IncredibleEmployment.be  This test is not yet approved or cleared by the [place] FDA and has been authorized for detection and/or diagnosis of SARS-CoV-2 by FDA under an Emergency Use Authorization (EUA). This EUA will remain in effect (meaning this test can be used) for the duration of the COVID-19 declaration under Section 564(b)(1) of the Act, 21 U.S.C. section 360bbb-3(b)(1), unless the authorization is terminated or revoked.  Performed at [facility], [place] [address] [place], [place] [address]   Urine Culture     Status: Abnormal   Collection Time: 07/26/21 10:58 PM   Specimen: In/Out Cath Urine  Result Value Ref Range Status   Specimen Description IN/OUT CATH URINE  Final   Special Requests   Final    NONE Performed at [facility], [place] [address] [place], [place] [address]    Culture MULTIPLE SPECIES PRESENT, SUGGEST RECOLLECTION (A)  Final   Report Status 07/27/2021 FINAL  Final  Blood Culture (routine x 2)     Status: Abnormal    Collection Time: 07/26/21 11:05 PM   Specimen: BLOOD  Result Value Ref Range Status   Specimen Description BLOOD RIGHT ANTECUBITAL  Final   Special Requests   Final    BOTTLES DRAWN AEROBIC AND ANAEROBIC Blood Culture adequate volume   Culture  Setup Time   Final    GRAM POSITIVE COCCI IN CLUSTERS IN BOTH AEROBIC AND ANAEROBIC BOTTLES CRITICAL VALUE NOTED.  VALUE IS CONSISTENT WITH PREVIOUSLY REPORTED AND CALLED VALUE.    Culture (A)  Final    STAPHYLOCOCCUS AUREUS SUSCEPTIBILITIES PERFORMED ON PREVIOUS CULTURE WITHIN THE LAST 5 DAYS. Performed at [facility], [place] [address], [place], [place] [address]    Report Status 07/30/2021 FINAL  Final  Blood Culture (routine x 2)     Status: Abnormal   Collection Time: 07/26/21 11:15 PM   Specimen: BLOOD RIGHT WRIST  Result Value Ref Range Status   Specimen Description BLOOD RIGHT WRIST  Final   Special Requests   Final    BOTTLES DRAWN AEROBIC AND ANAEROBIC Blood Culture adequate volume   Culture  Setup Time   Final    GRAM POSITIVE COCCI IN CLUSTERS AEROBIC BOTTLE  ONLY CRITICAL RESULT CALLED TO, READ BACK BY AND VERIFIED WITH: V BRYK,PHARMD@0553  07/28/21 MK    Culture (A)  Final    METHICILLIN RESISTANT STAPHYLOCOCCUS AUREUS Two isolates with different morphologies were identified as the same organism.The most resistant organism was reported. Performed at [facility], [address], [place], [place] [address]    Report Status 07/30/2021 FINAL  Final   Organism ID, Bacteria METHICILLIN RESISTANT STAPHYLOCOCCUS AUREUS  Final      Susceptibility   Methicillin resistant staphylococcus aureus - MIC*    CIPROFLOXACIN >=8 RESISTANT Resistant     ERYTHROMYCIN >=8 RESISTANT Resistant     GENTAMICIN >=16 RESISTANT Resistant     OXACILLIN >=4 RESISTANT Resistant     TETRACYCLINE >=16 RESISTANT Resistant     VANCOMYCIN 1 SENSITIVE Sensitive     TRIMETH/SULFA <=10 SENSITIVE Sensitive     CLINDAMYCIN >=8 RESISTANT Resistant      RIFAMPIN <=0.5 SENSITIVE Sensitive     Inducible Clindamycin NEGATIVE Sensitive     * METHICILLIN RESISTANT STAPHYLOCOCCUS AUREUS  Blood Culture ID Panel (Reflexed)     Status: Abnormal   Collection Time: 07/26/21 11:15 PM  Result Value Ref Range Status   Enterococcus faecalis NOT DETECTED NOT DETECTED Final   Enterococcus Faecium NOT DETECTED NOT DETECTED Final   Listeria monocytogenes NOT DETECTED NOT DETECTED Final   Staphylococcus species DETECTED (A) NOT DETECTED Final    Comment: CRITICAL RESULT CALLED TO, READ BACK BY AND VERIFIED WITH: V BRYK,PHARMD@0553  07/28/21 MK    Staphylococcus aureus (BCID) DETECTED (A) NOT DETECTED Final    Comment: Methicillin (oxacillin)-resistant Staphylococcus aureus (MRSA). MRSA is predictably resistant to beta-lactam antibiotics (except ceftaroline). Preferred therapy is vancomycin unless clinically contraindicated. Patient requires contact precautions if  hospitalized. CRITICAL RESULT CALLED TO, READ BACK BY AND VERIFIED WITH: V BYRK,PHARMD@0554  07/28/21 MK    Staphylococcus epidermidis NOT DETECTED NOT DETECTED Final   Staphylococcus lugdunensis NOT DETECTED NOT DETECTED Final   Streptococcus species NOT DETECTED NOT DETECTED Final   Streptococcus agalactiae NOT DETECTED NOT DETECTED Final   Streptococcus pneumoniae NOT DETECTED NOT DETECTED Final   Streptococcus pyogenes NOT DETECTED NOT DETECTED Final   A.calcoaceticus-baumannii NOT DETECTED NOT DETECTED Final   Bacteroides fragilis NOT DETECTED NOT DETECTED Final   Enterobacterales NOT DETECTED NOT DETECTED Final   Enterobacter cloacae complex NOT DETECTED NOT DETECTED Final   Escherichia coli NOT DETECTED NOT DETECTED Final   Klebsiella aerogenes NOT DETECTED NOT DETECTED Final   Klebsiella oxytoca NOT DETECTED NOT DETECTED Final   Klebsiella pneumoniae NOT DETECTED NOT DETECTED Final   Proteus species NOT DETECTED NOT DETECTED Final   Salmonella species NOT DETECTED NOT DETECTED Final    Serratia marcescens NOT DETECTED NOT DETECTED Final   Haemophilus influenzae NOT DETECTED NOT DETECTED Final   Neisseria meningitidis NOT DETECTED NOT DETECTED Final   Pseudomonas aeruginosa NOT DETECTED NOT DETECTED Final   Stenotrophomonas maltophilia NOT DETECTED NOT DETECTED Final   Candida albicans NOT DETECTED NOT DETECTED Final   Candida auris NOT DETECTED NOT DETECTED Final   Candida glabrata NOT DETECTED NOT DETECTED Final   Candida krusei NOT DETECTED NOT DETECTED Final   Candida parapsilosis NOT DETECTED NOT DETECTED Final   Candida tropicalis NOT DETECTED NOT DETECTED Final   Cryptococcus neoformans/gattii NOT DETECTED NOT DETECTED Final   Meth resistant mecA/C and MREJ DETECTED (A) NOT DETECTED Final    Comment: CRITICAL RESULT CALLED TO, READ BACK BY AND VERIFIED WITH: V BRYK,PHARMD@0553  07/28/21 MK  Performed at [facility], [place] [address] [place], [place] [address]   MRSA Next Gen by PCR, Nasal     Status: Abnormal   Collection Time: 07/27/21  5:35 PM   Specimen: Nasal Mucosa; Nasal Swab  Result Value Ref Range Status   MRSA by PCR Next Gen DETECTED (A) NOT DETECTED Final    Comment: RESULT CALLED TO, READ BACK BY AND VERIFIED WITH: CAROLINE NJOROGE RN 07/27/2021 @2035  BY JW (NOTE) The GeneXpert MRSA Assay (FDA approved for NASAL specimens only), is one component of a comprehensive MRSA colonization surveillance program. It is not intended to diagnose MRSA infection nor to guide or monitor treatment for MRSA infections. Test performance is not FDA approved in patients less than 64 years old. Performed at [facility], [place] [address] [place], [place] [address]   Culture, blood (routine x 2)     Status: None   Collection Time: 07/28/21  8:43 AM   Specimen: BLOOD  Result Value Ref Range Status   Specimen Description BLOOD LEFT ANTECUBITAL  Final   Special Requests   Final    BOTTLES DRAWN AEROBIC AND ANAEROBIC Blood Culture adequate volume    Culture   Final    NO GROWTH 5 DAYS Performed at [facility], [place] [address], [place], [place] [address]    Report Status 08/02/2021 FINAL  Final  Culture, blood (routine x 2)     Status: None   Collection Time: 07/28/21  8:49 AM   Specimen: BLOOD LEFT HAND  Result Value Ref Range Status   Specimen Description BLOOD LEFT HAND  Final   Special Requests   Final    BOTTLES DRAWN AEROBIC AND ANAEROBIC Blood Culture adequate volume   Culture   Final    NO GROWTH 5 DAYS Performed at [facility], [place] [address], [place], [place] [address]    Report Status 08/02/2021 FINAL  Final    Signed: Marlowe Aschoff Taisei Bonnette  [facility] 08/04/2021, 12:03 PM

## 2021-08-24 ENCOUNTER — Encounter (HOSPITAL_BASED_OUTPATIENT_CLINIC_OR_DEPARTMENT_OTHER): Payer: No Typology Code available for payment source | Attending: Internal Medicine | Admitting: Internal Medicine

## 2021-08-29 ENCOUNTER — Encounter (HOSPITAL_COMMUNITY): Payer: Self-pay

## 2021-08-29 ENCOUNTER — Emergency Department (HOSPITAL_COMMUNITY): Payer: No Typology Code available for payment source

## 2021-08-29 ENCOUNTER — Inpatient Hospital Stay (HOSPITAL_COMMUNITY): Payer: No Typology Code available for payment source

## 2021-08-29 ENCOUNTER — Inpatient Hospital Stay (HOSPITAL_COMMUNITY)
Admission: EM | Admit: 2021-08-29 | Discharge: 2021-09-08 | DRG: 871 | Disposition: A | Discharge status: Home Health | Payer: No Typology Code available for payment source | Attending: Internal Medicine | Admitting: Internal Medicine

## 2021-08-29 DIAGNOSIS — N179 Acute kidney failure, unspecified: Secondary | ICD-10-CM | POA: Diagnosis present

## 2021-08-29 DIAGNOSIS — J9601 Acute respiratory failure with hypoxia: Secondary | ICD-10-CM | POA: Diagnosis present

## 2021-08-29 DIAGNOSIS — R6521 Severe sepsis with septic shock: Secondary | ICD-10-CM | POA: Diagnosis present

## 2021-08-29 DIAGNOSIS — Z681 Body mass index (BMI) 19 or less, adult: Secondary | ICD-10-CM

## 2021-08-29 DIAGNOSIS — D696 Thrombocytopenia, unspecified: Secondary | ICD-10-CM | POA: Diagnosis present

## 2021-08-29 DIAGNOSIS — U071 COVID-19: Secondary | ICD-10-CM | POA: Diagnosis present

## 2021-08-29 DIAGNOSIS — G8220 Paraplegia, unspecified: Secondary | ICD-10-CM | POA: Diagnosis present

## 2021-08-29 DIAGNOSIS — Z8659 Personal history of other mental and behavioral disorders: Secondary | ICD-10-CM | POA: Diagnosis present

## 2021-08-29 DIAGNOSIS — A4150 Gram-negative sepsis, unspecified: Principal | ICD-10-CM | POA: Diagnosis present

## 2021-08-29 DIAGNOSIS — E871 Hypo-osmolality and hyponatremia: Secondary | ICD-10-CM | POA: Diagnosis present

## 2021-08-29 DIAGNOSIS — Z87828 Personal history of other (healed) physical injury and trauma: Secondary | ICD-10-CM

## 2021-08-29 DIAGNOSIS — I998 Other disorder of circulatory system: Secondary | ICD-10-CM | POA: Clinically undetermined

## 2021-08-29 DIAGNOSIS — L8944 Pressure ulcer of contiguous site of back, buttock and hip, stage 4: Secondary | ICD-10-CM | POA: Diagnosis not present

## 2021-08-29 DIAGNOSIS — F209 Schizophrenia, unspecified: Secondary | ICD-10-CM | POA: Diagnosis present

## 2021-08-29 DIAGNOSIS — S90829A Blister (nonthermal), unspecified foot, initial encounter: Secondary | ICD-10-CM | POA: Diagnosis present

## 2021-08-29 DIAGNOSIS — G9341 Metabolic encephalopathy: Secondary | ICD-10-CM | POA: Diagnosis present

## 2021-08-29 DIAGNOSIS — Z882 Allergy status to sulfonamides status: Secondary | ICD-10-CM

## 2021-08-29 DIAGNOSIS — N390 Urinary tract infection, site not specified: Secondary | ICD-10-CM | POA: Diagnosis present

## 2021-08-29 DIAGNOSIS — R7881 Bacteremia: Secondary | ICD-10-CM | POA: Diagnosis not present

## 2021-08-29 DIAGNOSIS — L89324 Pressure ulcer of left buttock, stage 4: Secondary | ICD-10-CM | POA: Diagnosis present

## 2021-08-29 DIAGNOSIS — Z79899 Other long term (current) drug therapy: Secondary | ICD-10-CM

## 2021-08-29 DIAGNOSIS — L89156 Pressure-induced deep tissue damage of sacral region: Secondary | ICD-10-CM | POA: Diagnosis present

## 2021-08-29 DIAGNOSIS — M4628 Osteomyelitis of vertebra, sacral and sacrococcygeal region: Secondary | ICD-10-CM | POA: Diagnosis present

## 2021-08-29 DIAGNOSIS — B964 Proteus (mirabilis) (morganii) as the cause of diseases classified elsewhere: Secondary | ICD-10-CM | POA: Diagnosis present

## 2021-08-29 DIAGNOSIS — E440 Moderate protein-calorie malnutrition: Secondary | ICD-10-CM | POA: Diagnosis present

## 2021-08-29 DIAGNOSIS — B9562 Methicillin resistant Staphylococcus aureus infection as the cause of diseases classified elsewhere: Secondary | ICD-10-CM | POA: Diagnosis present

## 2021-08-29 DIAGNOSIS — E876 Hypokalemia: Secondary | ICD-10-CM | POA: Diagnosis present

## 2021-08-29 DIAGNOSIS — I38 Endocarditis, valve unspecified: Secondary | ICD-10-CM | POA: Diagnosis not present

## 2021-08-29 DIAGNOSIS — E872 Acidosis, unspecified: Secondary | ICD-10-CM | POA: Diagnosis present

## 2021-08-29 DIAGNOSIS — F200 Paranoid schizophrenia: Secondary | ICD-10-CM | POA: Diagnosis present

## 2021-08-29 DIAGNOSIS — D6959 Other secondary thrombocytopenia: Secondary | ICD-10-CM | POA: Diagnosis present

## 2021-08-29 DIAGNOSIS — F1990 Other psychoactive substance use, unspecified, uncomplicated: Secondary | ICD-10-CM | POA: Diagnosis not present

## 2021-08-29 DIAGNOSIS — I5021 Acute systolic (congestive) heart failure: Secondary | ICD-10-CM | POA: Diagnosis present

## 2021-08-29 DIAGNOSIS — F419 Anxiety disorder, unspecified: Secondary | ICD-10-CM | POA: Diagnosis present

## 2021-08-29 DIAGNOSIS — M549 Dorsalgia, unspecified: Secondary | ICD-10-CM

## 2021-08-29 DIAGNOSIS — F1910 Other psychoactive substance abuse, uncomplicated: Secondary | ICD-10-CM | POA: Diagnosis present

## 2021-08-29 DIAGNOSIS — F4312 Post-traumatic stress disorder, chronic: Secondary | ICD-10-CM | POA: Diagnosis not present

## 2021-08-29 DIAGNOSIS — R748 Abnormal levels of other serum enzymes: Secondary | ICD-10-CM | POA: Diagnosis present

## 2021-08-29 DIAGNOSIS — A4902 Methicillin resistant Staphylococcus aureus infection, unspecified site: Secondary | ICD-10-CM

## 2021-08-29 DIAGNOSIS — A419 Sepsis, unspecified organism: Secondary | ICD-10-CM | POA: Diagnosis present

## 2021-08-29 DIAGNOSIS — Z91018 Allergy to other foods: Secondary | ICD-10-CM

## 2021-08-29 DIAGNOSIS — L8990 Pressure ulcer of unspecified site, unspecified stage: Secondary | ICD-10-CM | POA: Diagnosis present

## 2021-08-29 DIAGNOSIS — F17210 Nicotine dependence, cigarettes, uncomplicated: Secondary | ICD-10-CM | POA: Diagnosis present

## 2021-08-29 DIAGNOSIS — D689 Coagulation defect, unspecified: Secondary | ICD-10-CM | POA: Diagnosis present

## 2021-08-29 DIAGNOSIS — Z888 Allergy status to other drugs, medicaments and biological substances status: Secondary | ICD-10-CM

## 2021-08-29 DIAGNOSIS — D638 Anemia in other chronic diseases classified elsewhere: Secondary | ICD-10-CM | POA: Diagnosis present

## 2021-08-29 DIAGNOSIS — Z9109 Other allergy status, other than to drugs and biological substances: Secondary | ICD-10-CM

## 2021-08-29 DIAGNOSIS — X58XXXA Exposure to other specified factors, initial encounter: Secondary | ICD-10-CM | POA: Diagnosis present

## 2021-08-29 DIAGNOSIS — K2970 Gastritis, unspecified, without bleeding: Secondary | ICD-10-CM | POA: Diagnosis present

## 2021-08-29 DIAGNOSIS — Z818 Family history of other mental and behavioral disorders: Secondary | ICD-10-CM

## 2021-08-29 DIAGNOSIS — J9690 Respiratory failure, unspecified, unspecified whether with hypoxia or hypercapnia: Secondary | ICD-10-CM | POA: Diagnosis present

## 2021-08-29 HISTORY — DX: Paraplegia, unspecified: G82.20

## 2021-08-29 LAB — I-STAT ARTERIAL BLOOD GAS, ED
Acid-base deficit: 1 mmol/L (ref 0.0–2.0)
Bicarbonate: 23.5 mmol/L (ref 20.0–28.0)
Calcium, Ion: 1.11 mmol/L — ABNORMAL LOW (ref 1.15–1.40)
HCT: 33 % — ABNORMAL LOW (ref 39.0–52.0)
Hemoglobin: 11.2 g/dL — ABNORMAL LOW (ref 13.0–17.0)
O2 Saturation: 100 %
Patient temperature: 102
Potassium: 4.1 mmol/L (ref 3.5–5.1)
Sodium: 132 mmol/L — ABNORMAL LOW (ref 135–145)
TCO2: 25 mmol/L (ref 22–32)
pCO2 arterial: 40 mmHg (ref 32.0–48.0)
pH, Arterial: 7.386 (ref 7.350–7.450)
pO2, Arterial: 486 mmHg — ABNORMAL HIGH (ref 83.0–108.0)

## 2021-08-29 LAB — CBC WITH DIFFERENTIAL/PLATELET
Abs Immature Granulocytes: 0 10*3/uL (ref 0.00–0.07)
Basophils Absolute: 0 10*3/uL (ref 0.0–0.1)
Basophils Relative: 1 %
Eosinophils Absolute: 0.3 10*3/uL (ref 0.0–0.5)
Eosinophils Relative: 6 %
HCT: 37.3 % — ABNORMAL LOW (ref 39.0–52.0)
Hemoglobin: 11.5 g/dL — ABNORMAL LOW (ref 13.0–17.0)
Lymphocytes Relative: 16 %
Lymphs Abs: 0.8 10*3/uL (ref 0.7–4.0)
MCH: 27.7 pg (ref 26.0–34.0)
MCHC: 30.8 g/dL (ref 30.0–36.0)
MCV: 89.9 fL (ref 80.0–100.0)
Monocytes Absolute: 0 10*3/uL — ABNORMAL LOW (ref 0.1–1.0)
Monocytes Relative: 0 %
Neutro Abs: 3.6 10*3/uL (ref 1.7–7.7)
Neutrophils Relative %: 77 %
Platelets: 190 10*3/uL (ref 150–400)
RBC: 4.15 MIL/uL — ABNORMAL LOW (ref 4.22–5.81)
RDW: 15.9 % — ABNORMAL HIGH (ref 11.5–15.5)
WBC: 4.7 10*3/uL (ref 4.0–10.5)
nRBC: 0 % (ref 0.0–0.2)
nRBC: 2 /100 WBC — ABNORMAL HIGH

## 2021-08-29 LAB — BLOOD GAS, ARTERIAL
Acid-base deficit: 5.1 mmol/L — ABNORMAL HIGH (ref 0.0–2.0)
Bicarbonate: 18 mmol/L — ABNORMAL LOW (ref 20.0–28.0)
FIO2: 50
MECHVT: 560 mL
O2 Saturation: 99.6 %
PEEP: 5 cmH2O
Patient temperature: 39.7
RATE: 18 resp/min
pCO2 arterial: 32.4 mmHg (ref 32.0–48.0)
pH, Arterial: 7.377 (ref 7.350–7.450)
pO2, Arterial: 235 mmHg — ABNORMAL HIGH (ref 83.0–108.0)

## 2021-08-29 LAB — URINALYSIS, MICROSCOPIC (REFLEX)
RBC / HPF: >50 RBC/hpf (ref 0–5)
WBC, UA: >50 WBC/hpf (ref 0–5)

## 2021-08-29 LAB — URINALYSIS, ROUTINE W REFLEX MICROSCOPIC

## 2021-08-29 LAB — COMPREHENSIVE METABOLIC PANEL
ALT: 11 U/L (ref 0–44)
AST: 23 U/L (ref 15–41)
Albumin: 2.6 g/dL — ABNORMAL LOW (ref 3.5–5.0)
Alkaline Phosphatase: 92 U/L (ref 38–126)
Anion gap: 15 (ref 5–15)
BUN: 19 mg/dL (ref 6–20)
CO2: 17 mmol/L — ABNORMAL LOW (ref 22–32)
Calcium: 8.5 mg/dL — ABNORMAL LOW (ref 8.9–10.3)
Chloride: 102 mmol/L (ref 98–111)
Creatinine, Ser: 1.95 mg/dL — ABNORMAL HIGH (ref 0.61–1.24)
GFR, Estimated: 46 mL/min — ABNORMAL LOW (ref >60)
Glucose, Bld: 70 mg/dL (ref 70–99)
Potassium: 3.9 mmol/L (ref 3.5–5.1)
Sodium: 134 mmol/L — ABNORMAL LOW (ref 135–145)
Total Bilirubin: 1.4 mg/dL — ABNORMAL HIGH (ref 0.3–1.2)
Total Protein: 6.5 g/dL (ref 6.5–8.1)

## 2021-08-29 LAB — PROTIME-INR
INR: 1.4 — ABNORMAL HIGH (ref 0.8–1.2)
Prothrombin Time: 17 seconds — ABNORMAL HIGH (ref 11.4–15.2)

## 2021-08-29 LAB — LACTIC ACID, PLASMA
Lactic Acid, Venous: 7.1 mmol/L (ref 0.5–1.9)
Lactic Acid, Venous: 6.6 mmol/L (ref 0.5–1.9)

## 2021-08-29 LAB — APTT: aPTT: 34 seconds (ref 24–36)

## 2021-08-29 LAB — RESP PANEL BY RT-PCR (FLU A&B, COVID) ARPGX2
Influenza A by PCR: NEGATIVE
Influenza B by PCR: NEGATIVE
SARS Coronavirus 2 by RT PCR: POSITIVE — AB

## 2021-08-29 MED ORDER — SUCCINYLCHOLINE CHLORIDE 200 MG/10ML IV SOSY
PREFILLED_SYRINGE | INTRAVENOUS | Status: AC
  Filled 2021-08-29: qty 10

## 2021-08-29 MED ORDER — NOREPINEPHRINE 4 MG/250ML-% IV SOLN
0.0000 ug/min | INTRAVENOUS | Status: DC
  Administered 2021-08-29: 2 ug/min via INTRAVENOUS
  Filled 2021-08-29: qty 250

## 2021-08-29 MED ORDER — POLYETHYLENE GLYCOL 3350 17 G PO PACK: 17.0000 g | PACK | Freq: Every day | ORAL | Status: DC

## 2021-08-29 MED ORDER — SODIUM CHLORIDE 0.9 % IV BOLUS
500.0000 mL | Freq: Once | INTRAVENOUS | Status: AC
  Administered 2021-08-29: 500 mL via INTRAVENOUS

## 2021-08-29 MED ORDER — KETAMINE HCL 50 MG/5ML IJ SOSY
PREFILLED_SYRINGE | INTRAMUSCULAR | Status: AC
  Filled 2021-08-29: qty 5

## 2021-08-29 MED ORDER — MIDAZOLAM HCL 2 MG/2ML IJ SOLN
INTRAMUSCULAR | Status: AC
  Administered 2021-08-29: 2 mg via INTRAVENOUS
  Filled 2021-08-29: qty 2

## 2021-08-29 MED ORDER — MIDAZOLAM HCL 2 MG/2ML IJ SOLN
INTRAMUSCULAR | Status: AC
  Filled 2021-08-29: qty 2

## 2021-08-29 MED ORDER — SODIUM CHLORIDE 0.9 % IV SOLN
2.0000 g | Freq: Two times a day (BID) | INTRAVENOUS | Status: DC
  Administered 2021-08-29: 2 g via INTRAVENOUS
  Filled 2021-08-29: qty 2

## 2021-08-29 MED ORDER — FENTANYL BOLUS VIA INFUSION
50.0000 ug | INTRAVENOUS | Status: DC | PRN
  Administered 2021-08-29: 100 ug via INTRAVENOUS
  Filled 2021-08-29: qty 100

## 2021-08-29 MED ORDER — ACETAMINOPHEN 325 MG PO TABS
650.0000 mg | ORAL_TABLET | Freq: Four times a day (QID) | ORAL | Status: DC
  Administered 2021-08-29 – 2021-08-30 (×5): 650 mg via ORAL
  Filled 2021-08-29 (×6): qty 2

## 2021-08-29 MED ORDER — NOREPINEPHRINE 4 MG/250ML-% IV SOLN
2.0000 ug/min | INTRAVENOUS | Status: DC
  Administered 2021-08-29: 10 ug/min via INTRAVENOUS
  Filled 2021-08-29: qty 250

## 2021-08-29 MED ORDER — NOREPINEPHRINE 16 MG/250ML-% IV SOLN
0.0000 ug/min | INTRAVENOUS | Status: DC
  Administered 2021-08-29: 30 ug/min via INTRAVENOUS
  Administered 2021-08-30: 09:00:00 15 ug/min via INTRAVENOUS
  Administered 2021-08-30: 20:00:00 25 ug/min via INTRAVENOUS
  Administered 2021-08-31: 12 ug/min via INTRAVENOUS
  Filled 2021-08-29 (×4): qty 250

## 2021-08-29 MED ORDER — ETOMIDATE 2 MG/ML IV SOLN: 10.0000 mg | Freq: Once | INTRAVENOUS | Status: AC

## 2021-08-29 MED ORDER — FENTANYL 2500MCG IN NS 250ML (10MCG/ML) PREMIX INFUSION
50.0000 ug/h | INTRAVENOUS | Status: DC
Start: 2021-08-29 — End: 2021-08-30
  Administered 2021-08-29: 200 ug/h via INTRAVENOUS
  Administered 2021-08-29: 50 ug/h via INTRAVENOUS
  Filled 2021-08-29 (×2): qty 250

## 2021-08-29 MED ORDER — ROCURONIUM BROMIDE 50 MG/5ML IV SOLN
INTRAVENOUS | Status: AC | PRN
  Administered 2021-08-29: 60 mg via INTRAVENOUS

## 2021-08-29 MED ORDER — VANCOMYCIN HCL 1500 MG/300ML IV SOLN
1500.0000 mg | Freq: Once | INTRAVENOUS | Status: AC
  Administered 2021-08-29: 1500 mg via INTRAVENOUS
  Filled 2021-08-29: qty 300

## 2021-08-29 MED ORDER — CEFEPIME HCL 2 G IJ SOLR
2.0000 g | Freq: Once | INTRAMUSCULAR | Status: AC
  Administered 2021-08-29: 2 g via INTRAVENOUS
  Filled 2021-08-29: qty 2

## 2021-08-29 MED ORDER — LACTATED RINGERS IV BOLUS (SEPSIS)
250.0000 mL | Freq: Once | INTRAVENOUS | Status: AC
  Administered 2021-08-29: 250 mL via INTRAVENOUS

## 2021-08-29 MED ORDER — HEPARIN SODIUM (PORCINE) 5000 UNIT/ML IJ SOLN
5000.0000 [IU] | Freq: Three times a day (TID) | INTRAMUSCULAR | Status: DC
  Administered 2021-08-29 – 2021-09-03 (×14): 5000 [IU] via SUBCUTANEOUS
  Filled 2021-08-29 (×13): qty 1

## 2021-08-29 MED ORDER — MIDAZOLAM HCL 2 MG/2ML IJ SOLN
2.0000 mg | INTRAMUSCULAR | Status: AC | PRN
  Administered 2021-08-29 (×3): 2 mg via INTRAVENOUS
  Filled 2021-08-29 (×3): qty 2

## 2021-08-29 MED ORDER — ONDANSETRON HCL 4 MG/2ML IJ SOLN
4.0000 mg | Freq: Four times a day (QID) | INTRAMUSCULAR | Status: DC | PRN
  Administered 2021-09-02 – 2021-09-03 (×4): 4 mg via INTRAVENOUS
  Filled 2021-08-29 (×4): qty 2

## 2021-08-29 MED ORDER — NOREPINEPHRINE 4 MG/250ML-% IV SOLN
0.0000 ug/min | INTRAVENOUS | Status: DC
  Administered 2021-08-29: 40 ug/min via INTRAVENOUS
  Filled 2021-08-29: qty 250

## 2021-08-29 MED ORDER — VANCOMYCIN HCL IN DEXTROSE 1-5 GM/200ML-% IV SOLN: 1000.0000 mg | INTRAVENOUS | Status: DC

## 2021-08-29 MED ORDER — ROCURONIUM BROMIDE 10 MG/ML (PF) SYRINGE
PREFILLED_SYRINGE | INTRAVENOUS | Status: AC
  Filled 2021-08-29: qty 10

## 2021-08-29 MED ORDER — FENTANYL CITRATE PF 50 MCG/ML IJ SOSY
PREFILLED_SYRINGE | INTRAMUSCULAR | Status: AC
  Administered 2021-08-29: 50 ug via INTRAVENOUS
  Filled 2021-08-29: qty 1

## 2021-08-29 MED ORDER — PHENYLEPHRINE HCL-NACL 20-0.9 MG/250ML-% IV SOLN
0.0000 ug/min | INTRAVENOUS | Status: DC
  Administered 2021-08-29: 22:00:00 20 ug/min via INTRAVENOUS
  Administered 2021-08-30: 01:00:00 100 ug/min via INTRAVENOUS
  Administered 2021-08-30: 09:00:00 70 ug/min via INTRAVENOUS
  Filled 2021-08-29: qty 250
  Filled 2021-08-29: qty 500
  Filled 2021-08-29: qty 250

## 2021-08-29 MED ORDER — PHENYLEPHRINE 40 MCG/ML (10ML) SYRINGE FOR IV PUSH (FOR BLOOD PRESSURE SUPPORT)
PREFILLED_SYRINGE | INTRAVENOUS | Status: AC
  Administered 2021-08-29: 400 ug
  Filled 2021-08-29: qty 10

## 2021-08-29 MED ORDER — DEXAMETHASONE SODIUM PHOSPHATE 10 MG/ML IJ SOLN
6.0000 mg | INTRAMUSCULAR | Status: DC
  Administered 2021-08-29: 6 mg via INTRAVENOUS
  Filled 2021-08-29: qty 1

## 2021-08-29 MED ORDER — POLYETHYLENE GLYCOL 3350 17 G PO PACK: 17.0000 g | PACK | Freq: Every day | ORAL | Status: DC | PRN

## 2021-08-29 MED ORDER — SODIUM BICARBONATE 8.4 % IV SOLN
INTRAVENOUS | Status: DC
  Filled 2021-08-29 (×2): qty 1000

## 2021-08-29 MED ORDER — ETOMIDATE 2 MG/ML IV SOLN
INTRAVENOUS | Status: AC | PRN
  Administered 2021-08-29: 20 mg via INTRAVENOUS

## 2021-08-29 MED ORDER — LACTATED RINGERS IV SOLN: INTRAVENOUS | Status: DC

## 2021-08-29 MED ORDER — LACTATED RINGERS IV BOLUS (SEPSIS)
1000.0000 mL | Freq: Once | INTRAVENOUS | Status: AC
  Administered 2021-08-29: 1000 mL via INTRAVENOUS

## 2021-08-29 MED ORDER — ETOMIDATE 2 MG/ML IV SOLN
INTRAVENOUS | Status: AC
  Administered 2021-08-29: 10 mg via INTRAVENOUS
  Filled 2021-08-29: qty 20

## 2021-08-29 MED ORDER — VASOPRESSIN 20 UNITS/100 ML INFUSION FOR SHOCK
0.0300 [IU]/min | INTRAVENOUS | Status: DC
  Administered 2021-08-29 – 2021-08-31 (×5): 0.04 [IU]/min via INTRAVENOUS
  Administered 2021-08-31 – 2021-09-01 (×2): 0.03 [IU]/min via INTRAVENOUS
  Filled 2021-08-29 (×8): qty 100

## 2021-08-29 MED ORDER — SODIUM CHLORIDE 0.9 % IV SOLN
250.0000 mL | INTRAVENOUS | Status: DC
  Administered 2021-08-29 – 2021-08-31 (×2): 250 mL via INTRAVENOUS

## 2021-08-29 MED ORDER — METRONIDAZOLE 500 MG/100ML IV SOLN
500.0000 mg | Freq: Once | INTRAVENOUS | Status: AC
  Administered 2021-08-29: 500 mg via INTRAVENOUS
  Filled 2021-08-29: qty 100

## 2021-08-29 MED ORDER — MIDAZOLAM HCL 2 MG/2ML IJ SOLN
2.0000 mg | INTRAMUSCULAR | Status: DC | PRN
  Administered 2021-08-30 (×3): 2 mg via INTRAVENOUS
  Filled 2021-08-29 (×4): qty 2

## 2021-08-29 MED ORDER — DOCUSATE SODIUM 50 MG/5ML PO LIQD
100.0000 mg | Freq: Two times a day (BID) | ORAL | Status: DC
  Administered 2021-08-29: 100 mg

## 2021-08-29 MED ORDER — DOCUSATE SODIUM 100 MG PO CAPS: 100.0000 mg | ORAL_CAPSULE | Freq: Two times a day (BID) | ORAL | Status: DC | PRN

## 2021-08-29 MED ORDER — FENTANYL CITRATE PF 50 MCG/ML IJ SOSY
PREFILLED_SYRINGE | INTRAMUSCULAR | Status: AC
  Filled 2021-08-29: qty 2

## 2021-08-29 MED ORDER — ACETAMINOPHEN 650 MG RE SUPP
1300.0000 mg | Freq: Once | RECTAL | Status: AC
  Administered 2021-08-29: 1300 mg via RECTAL
  Filled 2021-08-29: qty 2

## 2021-08-29 MED ORDER — FENTANYL CITRATE PF 50 MCG/ML IJ SOSY: 50.0000 ug | PREFILLED_SYRINGE | Freq: Once | INTRAMUSCULAR | Status: AC

## 2021-08-29 MED ORDER — ACETAMINOPHEN 160 MG/5ML PO SOLN
650.0000 mg | ORAL | Status: AC | PRN
  Administered 2021-09-02 – 2021-09-03 (×2): 650 mg
  Filled 2021-08-29 (×3): qty 20.3

## 2021-08-29 NOTE — Sepsis Progress Note (Signed)
Sepsis protocol followed by eLink 

## 2021-08-29 NOTE — Progress Notes (Signed)
eLink Physician-Brief Progress Note Patient Name: Steve [name] DOB: 14-May-1991 MRN: [id number]   Date of Service  08/29/2021  HPI/Events of Note  Patient admitted with septic shock secondary to multiple infected wounds and possible UTI, he also has acute respiratory failure related to sepsis encephalopathy and had to be intubated in the ED.  eICU Interventions  New Patient Evaluation.        [name] 08/29/2021, 8:08 PM

## 2021-08-29 NOTE — Procedures (Signed)
Arterial Catheter Insertion Procedure Note  RISHON THILGES  [id number]  June 07, 1991  Date:08/29/21  Time:6:40 PM    Provider Performing: Cheri Fowler    Procedure: Insertion of Arterial Line (76195) with US guidance (09326)   Indication(s) Blood pressure monitoring and/or need for frequent ABGs  Consent Risks of the procedure as well as the alternatives and risks of each were explained to the patient and/or caregiver.  Consent for the procedure was obtained and is signed in the bedside chart  Anesthesia None   Time Out Verified patient identification, verified procedure, site/side was marked, verified correct patient position, special equipment/implants available, medications/allergies/relevant history reviewed, required imaging and test results available.   Sterile Technique Maximal sterile technique including full sterile barrier drape, hand hygiene, sterile gown, sterile gloves, mask, hair covering, sterile ultrasound probe cover (if used).   Procedure Description Area of catheter insertion was cleaned with chlorhexidine and draped in sterile fashion. With real-time ultrasound guidance an arterial catheter was placed into the left  Axillary  artery.  Appropriate arterial tracings confirmed on monitor.     Complications/Tolerance None; patient tolerated the procedure well.   EBL Minimal   Specimen(s) None

## 2021-08-29 NOTE — Code Documentation (Signed)
RT at bedside to assist for intubation

## 2021-08-29 NOTE — Progress Notes (Signed)
Pharmacy Antibiotic Note  Steve [name] is a 30 y.o. male admitted on 08/29/2021 with cellulitis.  Pharmacy has been consulted for vancomycin and cefepime dosing.  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    Allergies  Allergen Reactions   Caffeine Anaphylaxis   Caffeine Anaphylaxis   Chocolate Anaphylaxis   Chocolate Anaphylaxis   Tuberculin     Other reaction(s): Eruption of skin   Sulfa Antibiotics Rash   Sulfa Antibiotics Rash    Antimicrobials this admission: Vanc 12/13 >>  Cefepime 12/13 >>  Flagyl x1  Thank you for allowing pharmacy to be a part of this patients care.  08/31/21, PharmD, [facility] Emergency Medicine Clinical Pharmacist ED RPh Phone: [phone] Main RX: [phone]

## 2021-08-29 NOTE — ED Notes (Signed)
Called carelink °

## 2021-08-29 NOTE — Progress Notes (Addendum)
eLink Physician-Brief Progress Note Patient Name: Steve [name] DOB: [birth year] MRN: [id number]   Date of Service  08/29/2021  HPI/Events of Note  Patient with profound hypotension secondary to septic shock.  eICU Interventions  Norepinephrine gtt increased to 50 mcg, Phenylephrine 200 mcg iv push x 2, Phenylephrine gtt ordered,  NS 500 ml iv bolus x 1, stat ABG to r/o acidosis causing hypotension. BP up to 136/77, MAP 100.        [name] 08/29/2021, 9:59 PM

## 2021-08-29 NOTE — ED Notes (Signed)
7 fr. L IJ 14cm

## 2021-08-29 NOTE — ED Notes (Signed)
Versed admin

## 2021-08-29 NOTE — Procedures (Signed)
Central Venous Catheter Insertion Procedure Note  JUSTICE MILLIRON  [id number]  03-26-1991  Date:08/29/21  Time:3:10 PM   Provider Performing:Kelliann Pendergraph Erby Pian   Procedure: Insertion of Non-tunneled Central Venous Catheter(36556) with US guidance (46803)   Indication(s) Medication administration  Consent Unable to obtain consent due to emergent nature of procedure.  Anesthesia Topical only with 1% lidocaine   Timeout Verified patient identification, verified procedure, site/side was marked, verified correct patient position, special equipment/implants available, medications/allergies/relevant history reviewed, required imaging and test results available.  Sterile Technique Maximal sterile technique including full sterile barrier drape, hand hygiene, sterile gown, sterile gloves, mask, hair covering, sterile ultrasound probe cover (if used).  Procedure Description Area of catheter insertion was cleaned with chlorhexidine and draped in sterile fashion.  With real-time ultrasound guidance a central venous catheter was placed into the left internal jugular vein. Nonpulsatile blood flow and easy flushing noted in all ports.  The catheter was sutured in place and sterile dressing applied.  Complications/Tolerance None; patient tolerated the procedure well. Chest X-ray is ordered to verify placement for internal jugular or subclavian cannulation.   Chest x-ray is not ordered for femoral cannulation.  EBL Minimal  Specimen(s) None

## 2021-08-29 NOTE — ED Notes (Signed)
RN paged CCM for carelink

## 2021-08-29 NOTE — Procedures (Signed)
Patient self-extubated.  Not protecting airway, copious secretions.  Intubation Procedure Note  CORDAY WYKA  [id number]  21-Sep-1990  Date:08/29/21  Time:5:52 PM   Provider Performing:Danamarie Minami C Katrinka Blazing    Procedure: Intubation (31500)  Indication(s) Respiratory Failure  Consent Unable to obtain consent due to emergent nature of procedure.   Anesthesia None   Time Out Verified patient identification, verified procedure, site/side was marked, verified correct patient position, special equipment/implants available, medications/allergies/relevant history reviewed, required imaging and test results available.   Sterile Technique Usual hand hygeine, masks, and gloves were used   Procedure Description Patient positioned in bed supine.  Sedation given as noted above.  Patient was intubated with endotracheal tube using Glidescope.  View was Grade 1 full glottis .  Number of attempts was 1.  Colorimetric CO2 detector was consistent with tracheal placement.   Complications/Tolerance None; patient tolerated the procedure well. Chest X-ray is ordered to verify placement.   EBL Minimal   Specimen(s) None

## 2021-08-29 NOTE — Sedation Documentation (Signed)
ED Provider at bedside. 

## 2021-08-29 NOTE — ED Triage Notes (Signed)
Per EMS Parapalegic from waist down, known hx of substance abuse, pt was hot to touch, pt was initially bagged and was responsive upon arrival, pt has large sacral pressure wound, and wound on his upper back, and on side of rib cage

## 2021-08-29 NOTE — ED Notes (Signed)
Fentanyl admin.

## 2021-08-29 NOTE — ED Notes (Signed)
Intubation began, etomidate given, roc is in

## 2021-08-29 NOTE — ED Triage Notes (Signed)
Pt BIB EMS from home after family round unconscious, he is parap

## 2021-08-29 NOTE — ED Notes (Signed)
[facility] at facility for transport 

## 2021-08-29 NOTE — ED Notes (Signed)
Pt began to become agitated. RN at bedside to hold pt's hands down. Second RN to get PRN versed. Respiratory called d/t pt's ET tube being beside teeth. Pt decreased agitation after versed. RT called Dr. Katrinka Blazing. At bedside to reinsert tube.
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
## 2021-08-29 NOTE — H&P (Signed)
NAME:  Steve [name], MRN:  [id number], DOB:  1991/09/17, LOS: 0 ADMISSION DATE:  08/29/2021, CONSULTATION DATE:  08/29/2021 REFERRING MD:  Dr. Eulis Foster, CHIEF COMPLAINT:  Sepsis    History of Present Illness:  Steve [name] is a 30 y.o. 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   On ED arrival patient was seen with shallow respiration and was assisted with bag mask and alertness improved.  Workup on arrival consistent with sepsis given; temp 104.6, heart rate 127, hypotension with bp 64/38, WBC 4.7, lactic 7.1, and multiple concerns for infection source. 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 rate 18, height _0  (1.753 m), weight 61.2 kg, SpO2 100 %.        Intake/Output Summary (Last 24 hours) at 08/29/2021 1404 Last data filed at 08/29/2021 1250 Gross per 24 hour  Intake 2200 ml  Output --  Net 2200 ml   Filed Weights   08/29/21 1012 08/29/21 1300  Weight: 75 kg 61.2 kg    Examination: General: Acute on chronically ill appearing adult male seen lying in bed on mechanical ventilation, in NAD HEENT: [place]/AT, MM pink/moist, PERRL,  Neuro: Alert but minimally interactive, will track to verbal stimuli, paraplegic per baseline  CV: s1s2 regular rate and rhythm, no murmur, rubs, or gallops,  PULM:  Clear to ascultation, no increased work of breathing, oxygen  saturations appropriate on [place]  GI: soft, bowel sounds active in all 4 quadrants, non-tender, non-distended, tolerating TF Extremities: warm/dry, no edema,  Skin: multiple decubitus ulcers  Resolved Hospital Problem list     Assessment & Plan:  Severe sepsis -Workup on arrival consistent with sepsis given; temp 104.6, heart rate 127, hypotension with bp 64/38, WBC 4.7, lactic 7.1, and multiple concerns for infection source. Multiple sources including osteomyelitis and urosepsis  Multiple decubitus ulcers  -MRI pelvis 07/28/2021 revealed acute osteomyelitis at the posterior aspect of the ischial tuberosity on the left and left gluteal region decubitus ulcer with extensive underlying associated soft tissue edema likely representing cellulitis. Also deep subcutaneous and muscular edema visualized in the left pelvis, about the left hip and through the hamstring muscles, suggesting cellulitis and myositis. -Patient left AMA from November admission was discharge with oral Zyvox unsure of compliance  UTI -UA positive for many bacteria and turbid in appearance  P: Consult ID Admit ICU Intubate now for airway protection  Pan cultures prior to antibiotic IV Cefepime and Vancomycin  Aggressive IV hydration Continue pressors for MAP< 65  Place central access  Trend lactic acid Monitor urine output Capillary refill  Acute respiratory insufficiency  -Require bag mask assistance on ED arrival. Mentation remains poor on assessment. Concern for ability to protect airway longterm.  P: Intubate now in ED Continue ventilator support with lung protective strategies  Wean PEEP and FiO2 for sats greater than 90%. Head of bed elevated 30 degrees. Plateau pressures less than 30 cm H20.  Follow intermittent chest x-ray and ABG.   SAT/SBT as tolerated, mentation preclude extubation  Ensure adequate pulmonary hygiene  Follow cultures  VAP bundle in place  PAD protocol Check ABG  Acute metabolic  encephalopathy  -Per chart patient is typically alert and oriented, on ED admit patient was seen minimally responsive  Hx of C-spine injury with resulting paraplegia  P: Management per neurology  Maintain neuro protective measures; goal for eurothermia, euglycemia, eunatermia, normoxia, and PCO2 goal of 35-40 Nutrition and bowel regiment  Seizure precautions  Aspirations precautions   Acute Kidney Injury  -In the setting of sepsis. Creatinine on admit 1.96, creatinine 08/02/2021 0.61 P: Follow renal function  Monitor urine output Trend Bmet Avoid nephrotoxins Ensure adequate renal perfusion  IV hydration  Hx of polysubstance use  -Reported hx of meth use and was positive for amphetamines on last admit  P: Check UDS Cessation education   Anemia  P: CBC stable  Hgb goal > 7   Best Practice (right click and "Reselect all SmartList Selections" daily)   Diet/type: NPO DVT prophylaxis: prophylactic heparin  GI prophylaxis: PPI Lines: Central line Foley:  Yes, and it is still needed Code Status:  full code Last date of multidisciplinary goals of care discussion: Pending   Labs   CBC: Recent Labs  Lab 08/29/21 1020  WBC 4.7  NEUTROABS 3.6  HGB 11.5*  HCT 37.3*  MCV 89.9  PLT 680    Basic Metabolic Panel: Recent Labs  Lab 08/29/21 1020  NA 134*  K 3.9  CL 102  CO2 17*  GLUCOSE 70  BUN 19  CREATININE 1.95*  CALCIUM 8.5*   GFR: Estimated Creatinine Clearance: 47.5 mL/min (A) (by C-G formula based on SCr of 1.95 mg/dL (H)). Recent Labs  Lab 08/29/21 1020  WBC 4.7  LATICACIDVEN 7.1*    Liver Function Tests: Recent Labs  Lab 08/29/21 1020  AST 23  ALT 11  ALKPHOS 92  BILITOT 1.4*  PROT 6.5  ALBUMIN 2.6*   No results for input(s): LIPASE, AMYLASE in the last 168 hours. No results for input(s): AMMONIA in the last 168 hours.  ABG    Component Value Date/Time   TCO2 19 02/27/2014 0834     Coagulation Profile: Recent Labs  Lab  08/29/21 1020  INR 1.4*    Cardiac Enzymes: No results for input(s): CKTOTAL, CKMB, CKMBINDEX, TROPONINI in the last 168 hours.  HbA1C: Hgb A1c MFr Bld  Date/Time Value Ref Range Status  02/02/2021 04:44 AM 5.1 4.8 - 5.6 % Final    Comment:    (NOTE) Pre diabetes:          5.7%-6.4%  Diabetes:              >6.4%  Glycemic control for   <7.0% adults with diabetes     CBG: No results for input(s): GLUCAP in the last 168 hours.  Review of Systems:   Unable ot assess   Past Medical History:  He,  has a past medical history of Anxiety, Paranoid schizophrenia ([place]), and PTSD (post-traumatic stress disorder) (Paranoid).   Surgical History:   Past Surgical History:  Procedure Laterality Date   banding procedure for morbid obesity     I & D EXTREMITY Right 02/27/2014   Procedure: IRRIGATION AND DEBRIDEMENT FOREARM AND REPAIR OF 30cm LACERATION;  Surgeon: Johnny Bridge, MD;  Location: [place];  Service: Orthopedics;  Laterality: Right;  Anesthesia Regional with MAC   TEE WITHOUT CARDIOVERSION N/A 08/01/2021  Procedure: TRANSESOPHAGEAL ECHOCARDIOGRAM (TEE);  Surgeon: Pixie Casino, MD;  Location: [facility] ENDOSCOPY;  Service: Cardiovascular;  Laterality: N/A;     Social History:   reports that he has been smoking cigarettes. He has been smoking an average of .25 packs per day. He has never used smokeless tobacco. He reports that he does not currently use alcohol. He reports current drug use. Drugs: Marijuana and Methamphetamines.   Family History:  His family history includes Bipolar disorder in his father; Depression in his mother.   Allergies Allergies  Allergen Reactions   Caffeine Anaphylaxis   Caffeine Anaphylaxis   Chocolate Anaphylaxis   Chocolate Anaphylaxis   Tuberculin     Other reaction(s): Eruption of skin   Sulfa Antibiotics Rash   Sulfa Antibiotics Rash     Home Medications  Prior to Admission medications   Medication Sig Start Date End Date Taking?  Authorizing Provider  bacitracin ointment Apply 1 application topically 2 (two) times daily. Patient not taking: No sig reported 05/15/21   Horton, Barbette Hair, MD  baclofen (LIORESAL) 10 MG tablet Take 10-20 mg by mouth See admin instructions. 10 mg in the morning 20 mg at bedtime 12/14/20   [provider]  docusate sodium (COLACE) 100 MG capsule Take 1 capsule (100 mg total) by mouth 2 (two) times daily. Patient not taking: Reported on 07/27/2021 06/03/21   Antonieta Pert, MD  ferrous sulfate 325 (65 FE) MG tablet Take 1 tablet (325 mg total) by mouth 2 (two) times daily with a meal. Patient not taking: Reported on 07/27/2021 06/03/21 07/03/21  Antonieta Pert, MD  linezolid (ZYVOX) 600 MG tablet Take 1 tablet (600 mg total) by mouth 2 (two) times daily for 19 days. Start taking 08/20/21 08/20/21 09/08/21  Dahal, Marlowe Aschoff, MD  metroNIDAZOLE (FLAGYL) 500 MG tablet Take 500 mg by mouth See admin instructions. Every 8 hours x 14 days    [provider]  mupirocin ointment (BACTROBAN) 2 % Place 1 application into the nose 2 (two) times daily. Patient not taking: Reported on 07/27/2021 06/03/21   Antonieta Pert, MD  trospium (SANCTURA) 20 MG tablet Take 20 mg by mouth in the morning and at bedtime. 12/14/20   [provider]  OLANZapine (ZYPREXA) 2.5 MG tablet Take 1 tablet (2.5 mg total) by mouth at bedtime. Patient not taking: Reported on 09/30/2018 09/10/17 09/14/19  Orlie Dakin, MD  prazosin (MINIPRESS) 2 MG capsule Take 1 capsule (2 mg total) by mouth at bedtime. Patient not taking: Reported on 09/03/2017 11/04/16 09/14/19  Lurena Nida, NP     Critical care time:    CRITICAL CARE Performed by: Brittin Janik D. Harris   Total critical care time: 50 minutes  Critical care time was exclusive of separately billable procedures and treating other patients.  Critical care was necessary to treat or prevent imminent or life-threatening deterioration.  Critical care was time spent  personally by me on the following activities: development of treatment plan with patient and/or surrogate as well as nursing, discussions with consultants, evaluation of patient's response to treatment, examination of patient, obtaining history from patient or surrogate, ordering and performing treatments and interventions, ordering and review of laboratory studies, ordering and review of radiographic studies, pulse oximetry and re-evaluation of patient's condition.  [place] D. Kenton Kingfisher, NP-C [facility] Personal contact information can be found on Amion  08/29/2021, 2:49 PM

## 2021-08-29 NOTE — Sedation Documentation (Signed)
Vital signs stable. 

## 2021-08-29 NOTE — Sedation Documentation (Signed)
Medication dose calculated and verified for: etomidate, fentanyl

## 2021-08-29 NOTE — Procedures (Signed)
Intubation Procedure Note  Steve [name]  [id number]  [birth year]  Date:08/29/21  Time:3:09 PM   Provider Performing:[name]    Procedure: Intubation (31500)  Indication(s) Respiratory Failure  Consent Unable to obtain consent due to emergent nature of procedure.   Anesthesia Etomidate, Versed, Fentanyl, and Rocuronium   Time Out Verified patient identification, verified procedure, site/side was marked, verified correct patient position, special equipment/implants available, medications/allergies/relevant history reviewed, required imaging and test results available.   Sterile Technique Usual hand hygeine, masks, and gloves were used   Procedure Description Patient positioned in bed supine.  Sedation given as noted above.  Patient was intubated with endotracheal tube using Glidescope.  View was Grade 1 full glottis .  Number of attempts was 1.  Upper trachea deviated requiring rotation of rigid stylet to allow ETT entry. Colorimetric CO2 detector was consistent with tracheal placement.   Complications/Tolerance None; patient tolerated the procedure well. Chest X-ray is ordered to verify placement.   EBL Minimal   Specimen(s) None

## 2021-08-29 NOTE — ED Notes (Signed)
Katrinka Blazing, MD, Respiratory at bedside, pharmacy at bedside, 2 RN's and EMT at bedside

## 2021-08-30 ENCOUNTER — Inpatient Hospital Stay (HOSPITAL_COMMUNITY): Payer: No Typology Code available for payment source

## 2021-08-30 ENCOUNTER — Encounter (HOSPITAL_COMMUNITY): Payer: Self-pay | Admitting: Internal Medicine

## 2021-08-30 DIAGNOSIS — I38 Endocarditis, valve unspecified: Secondary | ICD-10-CM

## 2021-08-30 DIAGNOSIS — R7881 Bacteremia: Secondary | ICD-10-CM

## 2021-08-30 LAB — BASIC METABOLIC PANEL
Anion gap: 13 (ref 5–15)
BUN: 40 mg/dL — ABNORMAL HIGH (ref 6–20)
CO2: 19 mmol/L — ABNORMAL LOW (ref 22–32)
Calcium: 6.8 mg/dL — ABNORMAL LOW (ref 8.9–10.3)
Chloride: 99 mmol/L (ref 98–111)
Creatinine, Ser: 3.17 mg/dL — ABNORMAL HIGH (ref 0.61–1.24)
GFR, Estimated: 26 mL/min — ABNORMAL LOW (ref >60)
Glucose, Bld: 122 mg/dL — ABNORMAL HIGH (ref 70–99)
Potassium: 4.6 mmol/L (ref 3.5–5.1)
Sodium: 131 mmol/L — ABNORMAL LOW (ref 135–145)

## 2021-08-30 LAB — MAGNESIUM: Magnesium: 1.5 mg/dL — ABNORMAL LOW (ref 1.7–2.4)

## 2021-08-30 LAB — BLOOD CULTURE ID PANEL (REFLEXED) - BCID2
A.calcoaceticus-baumannii: NOT DETECTED
Bacteroides fragilis: NOT DETECTED
CTX-M ESBL: DETECTED — AB
Candida albicans: NOT DETECTED
Candida auris: NOT DETECTED
Candida glabrata: NOT DETECTED
Candida krusei: NOT DETECTED
Candida parapsilosis: NOT DETECTED
Candida tropicalis: NOT DETECTED
Carbapenem resist OXA 48 LIKE: NOT DETECTED
Carbapenem resistance IMP: NOT DETECTED
Carbapenem resistance KPC: NOT DETECTED
Carbapenem resistance NDM: NOT DETECTED
Carbapenem resistance VIM: NOT DETECTED
Cryptococcus neoformans/gattii: NOT DETECTED
Enterobacter cloacae complex: NOT DETECTED
Enterobacterales: DETECTED — AB
Enterococcus Faecium: NOT DETECTED
Enterococcus faecalis: NOT DETECTED
Escherichia coli: NOT DETECTED
Haemophilus influenzae: NOT DETECTED
Klebsiella aerogenes: NOT DETECTED
Klebsiella oxytoca: NOT DETECTED
Klebsiella pneumoniae: NOT DETECTED
Listeria monocytogenes: NOT DETECTED
Neisseria meningitidis: NOT DETECTED
Proteus species: NOT DETECTED
Pseudomonas aeruginosa: NOT DETECTED
Salmonella species: NOT DETECTED
Serratia marcescens: NOT DETECTED
Staphylococcus aureus (BCID): NOT DETECTED
Staphylococcus epidermidis: NOT DETECTED
Staphylococcus lugdunensis: NOT DETECTED
Staphylococcus species: NOT DETECTED
Stenotrophomonas maltophilia: NOT DETECTED
Streptococcus agalactiae: NOT DETECTED
Streptococcus pneumoniae: NOT DETECTED
Streptococcus pyogenes: NOT DETECTED
Streptococcus species: NOT DETECTED

## 2021-08-30 LAB — ECHOCARDIOGRAM LIMITED
Calc EF: 34.5 %
Height: 69 in
Single Plane A2C EF: 29 %
Single Plane A4C EF: 42.1 %
Weight: 2160 oz

## 2021-08-30 LAB — GLUCOSE, CAPILLARY
Glucose-Capillary: 85 mg/dL (ref 70–99)
Glucose-Capillary: 96 mg/dL (ref 70–99)
Glucose-Capillary: 66 mg/dL — ABNORMAL LOW (ref 70–99)
Glucose-Capillary: 55 mg/dL — ABNORMAL LOW (ref 70–99)
Glucose-Capillary: 83 mg/dL (ref 70–99)
Glucose-Capillary: 91 mg/dL (ref 70–99)
Glucose-Capillary: 72 mg/dL (ref 70–99)
Glucose-Capillary: 92 mg/dL (ref 70–99)

## 2021-08-30 LAB — CBC
HCT: 34.7 % — ABNORMAL LOW (ref 39.0–52.0)
Hemoglobin: 11.2 g/dL — ABNORMAL LOW (ref 13.0–17.0)
MCH: 27.5 pg (ref 26.0–34.0)
MCHC: 32.3 g/dL (ref 30.0–36.0)
MCV: 85 fL (ref 80.0–100.0)
Platelets: 86 10*3/uL — ABNORMAL LOW (ref 150–400)
RBC: 4.08 MIL/uL — ABNORMAL LOW (ref 4.22–5.81)
RDW: 16.4 % — ABNORMAL HIGH (ref 11.5–15.5)
WBC: 17.5 10*3/uL — ABNORMAL HIGH (ref 4.0–10.5)
nRBC: 0 % (ref 0.0–0.2)

## 2021-08-30 LAB — RAPID URINE DRUG SCREEN, HOSP PERFORMED
Amphetamines: POSITIVE — AB
Amphetamines: POSITIVE — AB
Barbiturates: NOT DETECTED
Barbiturates: NOT DETECTED
Benzodiazepines: NOT DETECTED
Benzodiazepines: POSITIVE — AB
Cocaine: NOT DETECTED
Cocaine: NOT DETECTED
Opiates: NOT DETECTED
Opiates: NOT DETECTED
Tetrahydrocannabinol: NOT DETECTED
Tetrahydrocannabinol: POSITIVE — AB

## 2021-08-30 LAB — COOXEMETRY PANEL
Carboxyhemoglobin: 0.7 % (ref 0.5–1.5)
Carboxyhemoglobin: 0.9 % (ref 0.5–1.5)
Methemoglobin: 0.4 % (ref 0.0–1.5)
Methemoglobin: 0.6 % (ref 0.0–1.5)
O2 Saturation: 98.5 %
O2 Saturation: 72.5 %
Total hemoglobin: 11.4 g/dL — ABNORMAL LOW (ref 12.0–16.0)
Total hemoglobin: 11.4 g/dL — ABNORMAL LOW (ref 12.0–16.0)

## 2021-08-30 LAB — LACTIC ACID, PLASMA
Lactic Acid, Venous: 4.4 mmol/L (ref 0.5–1.9)
Lactic Acid, Venous: 3.1 mmol/L (ref 0.5–1.9)

## 2021-08-30 LAB — MRSA NEXT GEN BY PCR, NASAL: MRSA by PCR Next Gen: DETECTED — AB

## 2021-08-30 LAB — PHOSPHORUS: Phosphorus: 6.5 mg/dL — ABNORMAL HIGH (ref 2.5–4.6)

## 2021-08-30 LAB — SODIUM, URINE, RANDOM: Sodium, Ur: 124 mmol/L

## 2021-08-30 LAB — CREATININE, URINE, RANDOM: Creatinine, Urine: 39.02 mg/dL

## 2021-08-30 MED ORDER — SODIUM CHLORIDE 0.9 % IV SOLN
1.0000 g | Freq: Two times a day (BID) | INTRAVENOUS | Status: DC
  Administered 2021-08-30 – 2021-08-31 (×3): 1 g via INTRAVENOUS
  Filled 2021-08-30 (×4): qty 1

## 2021-08-30 MED ORDER — DEXTROSE-NACL 5-0.9 % IV SOLN: INTRAVENOUS | Status: DC

## 2021-08-30 MED ORDER — STERILE WATER FOR INJECTION IJ SOLN
INTRAMUSCULAR | Status: AC
  Administered 2021-08-30: 22:00:00 2.1 mL
  Filled 2021-08-30: qty 10

## 2021-08-30 MED ORDER — DEXTROSE 50 % IV SOLN
INTRAVENOUS | Status: AC
  Administered 2021-08-30: 16:00:00 50 mL
  Filled 2021-08-30: qty 50

## 2021-08-30 MED ORDER — SODIUM CHLORIDE 0.9 % IV SOLN
8.0000 mg/kg | INTRAVENOUS | Status: DC
  Administered 2021-08-30: 21:00:00 500 mg via INTRAVENOUS
  Filled 2021-08-30: qty 10

## 2021-08-30 MED ORDER — MAGNESIUM SULFATE 2 GM/50ML IV SOLN
2.0000 g | Freq: Once | INTRAVENOUS | Status: AC
  Administered 2021-08-30: 11:00:00 2 g via INTRAVENOUS
  Filled 2021-08-30: qty 50

## 2021-08-30 MED ORDER — HYDROCORTISONE SOD SUC (PF) 100 MG IJ SOLR
100.0000 mg | Freq: Three times a day (TID) | INTRAMUSCULAR | Status: DC
  Administered 2021-08-30 – 2021-09-05 (×18): 100 mg via INTRAVENOUS
  Filled 2021-08-30 (×18): qty 2

## 2021-08-30 MED ORDER — OLANZAPINE 10 MG IM SOLR
10.0000 mg | Freq: Once | INTRAMUSCULAR | Status: AC
  Administered 2021-08-30: 22:00:00 10 mg via INTRAMUSCULAR
  Filled 2021-08-30: qty 10

## 2021-08-30 MED ORDER — DOCUSATE SODIUM 100 MG PO CAPS
100.0000 mg | ORAL_CAPSULE | Freq: Two times a day (BID) | ORAL | Status: DC
  Administered 2021-08-30 – 2021-09-08 (×7): 100 mg via ORAL
  Filled 2021-08-30 (×9): qty 1

## 2021-08-30 MED ORDER — DEXTROSE 50 % IV SOLN: 12.5000 g | INTRAVENOUS | Status: AC

## 2021-08-30 MED ORDER — MIDODRINE HCL 5 MG PO TABS
10.0000 mg | ORAL_TABLET | Freq: Three times a day (TID) | ORAL | Status: DC
  Administered 2021-08-30 – 2021-09-05 (×19): 10 mg via ORAL
  Filled 2021-08-30 (×19): qty 2

## 2021-08-30 MED ORDER — ORAL CARE MOUTH RINSE
15.0000 mL | Freq: Two times a day (BID) | OROMUCOSAL | Status: DC
  Administered 2021-08-30 – 2021-09-08 (×16): 15 mL via OROMUCOSAL

## 2021-08-30 MED ORDER — POLYETHYLENE GLYCOL 3350 17 G PO PACK
17.0000 g | PACK | Freq: Every day | ORAL | Status: DC
  Administered 2021-09-05 – 2021-09-08 (×2): 17 g via ORAL
  Filled 2021-08-30 (×3): qty 1

## 2021-08-30 MED ORDER — DEXTROSE 50 % IV SOLN
INTRAVENOUS | Status: AC
  Administered 2021-08-30: 11:00:00 12.5 g via INTRAVENOUS
  Filled 2021-08-30: qty 50

## 2021-08-30 MED ORDER — CHLORHEXIDINE GLUCONATE CLOTH 2 % EX PADS
6.0000 | MEDICATED_PAD | Freq: Every day | CUTANEOUS | Status: DC
  Administered 2021-08-30 – 2021-09-08 (×10): 6 via TOPICAL

## 2021-08-30 MED ORDER — OLANZAPINE 5 MG PO TBDP: 5.0000 mg | ORAL_TABLET | Freq: Every day | ORAL | Status: DC

## 2021-08-30 NOTE — Progress Notes (Signed)
Pharmacy Antibiotic Note  Steve [name] is a 30 y.o. male admitted on 08/29/2021 with cellulitis.  Pharmacy has been consulted for vancomycin and cefepime dosing. 08/30/2021 WBC 4.7> 17.4 SCr 1.95> 3.17 Tm 104.7, on levo, neo, vaso. intubated BCID w/ resistant GNR Hx MRSA bacteremia/osteo To switch from vancomycin to daptomycin  Plan:  Continue meropenem 1 gm IV q12 DC vancomycin Daptomycin 8 mg/kg = 500 mg IV q48, check CK qweek F/u BCx  -Monitor renal function, clinical status, and antibiotic plan  Height: 5\' 9"  (175.3 cm) Weight: 61.2 kg (135 lb) IBW/kg (Calculated) : 70.7  Temp (24hrs), Avg:101.8 F (38.8 C), Min:97.9 F (36.6 C), Max:104.7 F (40.4 C)  Recent Labs  Lab 08/29/21 1020 08/29/21 1211 08/30/21 0355  WBC 4.7  --  17.5*  CREATININE 1.95*  --  3.17*  LATICACIDVEN 7.1* 6.6*  --      Estimated Creatinine Clearance: 29.2 mL/min (A) (by C-G formula based on SCr of 3.17 mg/dL (H)).    Allergies  Allergen Reactions   Caffeine Anaphylaxis   Caffeine Anaphylaxis   Chocolate Anaphylaxis   Chocolate Anaphylaxis   Tuberculin     Other reaction(s): Eruption of skin   Sulfa Antibiotics Rash   Sulfa Antibiotics Rash    Antimicrobials this admission: 12/13 Vanc  >> 12/14 12/13 Cefepime >> 12/14 12/14 meropenem>> 12/13 Flagyl x1 12/14 dapto>> Dose adjustments this admission:   Microbiology results:  12/13 Covid+ (CT 38.7) - incidental 12/13 UCx: 12/13 BCx2: GNR, CTX-M ESLB detected on BCID PTA 08/05/21 BCx: MRSA  Thank you for allowing pharmacy to be a part of this patients care.  08/07/21, Pharm.D 08/30/2021 8:08 AM

## 2021-08-30 NOTE — Progress Notes (Addendum)
eLink Physician-Brief Progress Note Patient Name: Steve [name] DOB: 08/31/1991 MRN: [id number]   Date of Service  08/30/2021  HPI/Events of Note  Patient needs restraints order renewed. Blood sugar in low normal range and patient only on clear liquid diet. Patient has a history of schizophrenia and has not been on his home medications.  eICU Interventions  Restraints re-ordered, Zyprexa ordered, D 5 % NS gtt ordered to prevent hypoglycemia.     Intervention Category Intermediate Interventions: Other:  Migdalia Dk 08/30/2021, 9:58 PM

## 2021-08-30 NOTE — Consult Note (Signed)
WOC Nurse Consult Note: Patient receiving care in [facility] ICU 1233. Consult completed via ELink camera and assistance of 2 RNs for this Covid + patient. Reason for Consult: multiple wounds Wound type: Formerly known wounds that are now healed include the following: Left elbow--is now a scab. Foam dressing is in use to protect the area. Left forearm is not healed, no scab. Left lateral malleolus has a small scab. Foam dressing in use to protect it. Right medial malleolus has a small scab. Foam dressing in use to protect it. Right posterior flank is now a healed scar.  Right mid-back has a pink superficial wound that measures 3 cm x 2 cm x no depth. A foam dressing is in use. Unclear etiology for this wound.  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 I have ordered twice daily saline moistened guaze and ABD pad for this. Pressure Injury POA: Yes Measurement: see above Wound bed: as described Drainage (amount, consistency, odor)  Periwound: intact Dressing procedure/placement/frequency: As above.  Monitor the wound area(s) for worsening of condition such as: Signs/symptoms of infection,  Increase in size,  Development of or worsening of odor, Development of pain, or increased pain at the affected locations.  Notify the medical team if any of these develop.  Thank you for the consult.  Discussed plan of care with the patient and bedside nurse.  WOC nurse will not follow at this time.  Please re-consult the WOC team if needed.  Helmut Muster, RN, MSN, CWOCN, CNS-BC, pager [phone]

## 2021-08-30 NOTE — Consult Note (Signed)
[facility]    Date of Admission:  08/29/2021     Total days of antibiotics 2               Reason for Consult: Sepsis / Bacteremia  Referring Provider: Dr. Tamala Julian  Primary Care Provider: [facility]   ASSESSMENT:  Steve [name] is a 30 y/o caucasian gentleman admitted with severe sepsis in the setting of incomplete treatment for sacral osteomyelitis and previous MRSA bacteremia readmitted with altered mental status and blood cultures now positive for Enterobactorales species with CTX-M ESBL present. There are multiple sources of infection at this point including his decubitus ulcers and osteomyelitis complicated by continued substance use. Subsequently Covid positive. TTE results are pending. Continue with current dose of meropenem and add daptomycin given previous incomplete treatment MRSA  infection and new AKI in the setting of sepsis. Having auditory hallucinations and would likely benefit from psychiatric consult for schizophrenia. Certainly remains a high risk for elopement given previous history of leaving AMA. Medical and supportive care per primary team.   PLAN:  Continue meropenem and add daptomycin.  Monitor cultures for organism identification Await results of TTE. Agree with wound care evaluation. Recommend psychiatric evaluation for poorly controlled schizophrenia with auditory hallucinations when appropriate Remaining medical and supportive care per primary team.     Principal Problem:   Septic shock ([place]) Active Problems:   Pressure injury of skin   Sacral osteomyelitis (HCC)   Gram-negative bacteremia    acetaminophen  650 mg Oral Q6H   docusate  100 mg Per Tube BID   heparin  5,000 Units Subcutaneous Q8H   hydrocortisone sod succinate (SOLU-CORTEF) inj  100 mg Intravenous Q8H   midodrine  10 mg Oral TID WC   polyethylene glycol  17 g Per Tube Daily     HPI: Steve [name] is a 30 y.o. 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   Steve [name] was previously admitted to the hospital on 07/26/21 and seen by the ID team for MRSA bacteremia after being brough from home with altered mental status. CT pelvis showed large open ulceration in the medial gluteal region on left with new bony destruction of the ischial tuberosity suggesting osteomyelitis  and possible myositis of the gluteal muscles and obturator internus. TEE completed on 08/01/21 with no evidence of vegetations/endocarditis. Was not a candidate for home infusion secondary to substance use. Plan of care was for 2 weeks of inpatient Daptomycin with one dose of Oritavancin followed by 2 weeks of Zyvox. Also completed 2 week course of Augmentin for SSTI of decubitus ulcer.  Subsequently left against medical advice on 08/03/21.   Steve [name] presented to the ED on 08/29/21 via EMS after being found by family with altered mental status. Septic on arrival being febrile, tachycarding and hypotensive refractory to fluid resuscitation and requiring vasopressor support. Lactic acid was 7.1. Chest x-ray with no cardiopulmonary disease. Unable to protect airway and was intubated for respiratory failure. Subsequently self-extubated and was re-intubated. Started on broad spectrum antibiotics receiving vancomycin, cefepime, metronidazole and meropenem.   Steve [name] fever curve is improved since initial admission and now with leukocytosis with WBC count of 17.5. Blood culture positive for Enterobacterales with organism pending with CTX-M ESBL present. He has subsequently self-extubated once again this morning. Echo cardiogram completed with results pending.  Currently with central and arterial lines. Several sources of potential infection including multiple decubitus ulcers with the worst being on the sacrum.  Toxicology screen positive for amphetamines. Currently with acute kidney injury with creatinine of 3.17.    Review of Systems: Review of Systems  Constitutional:  Negative for chills, fever and weight loss.  Respiratory:  Negative for cough, shortness of breath and wheezing.   Cardiovascular:  Negative for chest pain and leg swelling.  Gastrointestinal:  Negative for abdominal pain, constipation, diarrhea, nausea and vomiting.  Skin:  Negative for rash.  Psychiatric/Behavioral:  Positive for hallucinations.     Past Medical History:  Diagnosis Date   Anxiety    Paranoid schizophrenia (HCC)    Paraplegia (HCC)    PTSD (post-traumatic stress disorder) Paranoid    Social History   Tobacco Use   Smoking status: Some Days    Packs/day: 0.25    Types: Cigarettes   Smokeless tobacco: Never  Vaping Use   Vaping Use: Unknown  Substance Use Topics   Alcohol use: Not Currently   Drug use: Yes    Types: Marijuana, Methamphetamines    Family History  Problem Relation Age of Onset   Depression Mother    Bipolar disorder Father     Allergies  Allergen Reactions   Caffeine Anaphylaxis   Caffeine Anaphylaxis   Chocolate Anaphylaxis   Chocolate Anaphylaxis   Tuberculin     Other reaction(s): Eruption of skin   Sulfa Antibiotics Rash   Sulfa Antibiotics Rash    OBJECTIVE: Blood pressure 119/77, pulse 69, temperature 98.4 F (36.9 C), resp. rate 16, height 5\' 9"  (1.753 m), weight 61.2 kg, SpO2 100 %.  Physical Exam Constitutional:      General: He is not in acute distress.    Appearance: He is well-developed. He is ill-appearing.     Interventions: He is restrained.  Cardiovascular:     Rate and Rhythm: Regular rhythm. Tachycardia present.     Heart sounds: Normal heart sounds.     Comments: Arterial line present.  Pulmonary:     Effort: Pulmonary effort is normal.     Breath sounds: Normal breath sounds.  Skin:    General: Skin is warm and dry.  Neurological:      Mental Status: He is alert and oriented to person, place, and time.  Psychiatric:        Attention and Perception: He perceives auditory hallucinations.        Thought Content: Thought content is delusional.        Cognition and Memory: Cognition is impaired.    Lab Results Lab Results  Component Value Date   WBC 17.5 (H) 08/30/2021   HGB 11.2 (L) 08/30/2021   HCT 34.7 (L) 08/30/2021   MCV 85.0 08/30/2021   PLT 86 (L) 08/30/2021    Lab Results  Component Value Date   CREATININE 3.17 (H) 08/30/2021   BUN 40 (H) 08/30/2021   NA 131 (L) 08/30/2021   K 4.6 08/30/2021   CL 99 08/30/2021   CO2 19 (L) 08/30/2021    Lab Results  Component Value Date   ALT 11 08/29/2021   AST 23 08/29/2021   ALKPHOS 92 08/29/2021   BILITOT 1.4 (H) 08/29/2021     Microbiology: Recent Results (from the past 240 hour(s))  Blood Culture (routine x 2)     Status: None (Preliminary result)   Collection Time: 08/29/21 10:16 AM   Specimen: BLOOD  Result Value Ref Range Status  Specimen Description BLOOD BLOOD RIGHT HAND  Final   Special Requests   Final    BOTTLES DRAWN AEROBIC AND ANAEROBIC Blood Culture results may not be optimal due to an inadequate volume of blood received in culture bottles   Culture   Final    NO GROWTH < 24 HOURS Performed at [facility] [address] [place], [place] [address]    Report Status PENDING  Incomplete  Blood Culture (routine x 2)     Status: None (Preliminary result)   Collection Time: 08/29/21 10:20 AM   Specimen: BLOOD  Result Value Ref Range Status   Specimen Description BLOOD LEFT ANTECUBITAL  Final   Special Requests   Final    BOTTLES DRAWN AEROBIC AND ANAEROBIC Blood Culture adequate volume   Culture  Setup Time   Final    GRAM NEGATIVE RODS AEROBIC BOTTLE ONLY CRITICAL RESULT CALLED TO, READ BACK BY AND VERIFIED WITH: M LILLISTON,PHARMD@[id number]  08/30/21 [place] Performed at [facility], [place] [address], [place], [place] [address]     Culture GRAM NEGATIVE RODS  Final   Report Status PENDING  Incomplete  Blood Culture ID Panel (Reflexed)     Status: Abnormal   Collection Time: 08/29/21 10:20 AM  Result Value Ref Range Status   Enterococcus faecalis NOT DETECTED NOT DETECTED Final   Enterococcus Faecium NOT DETECTED NOT DETECTED Final   Listeria monocytogenes NOT DETECTED NOT DETECTED Final   Staphylococcus species NOT DETECTED NOT DETECTED Final   Staphylococcus aureus (BCID) NOT DETECTED NOT DETECTED Final   Staphylococcus epidermidis NOT DETECTED NOT DETECTED Final   Staphylococcus lugdunensis NOT DETECTED NOT DETECTED Final   Streptococcus species NOT DETECTED NOT DETECTED Final   Streptococcus agalactiae NOT DETECTED NOT DETECTED Final   Streptococcus pneumoniae NOT DETECTED NOT DETECTED Final   Streptococcus pyogenes NOT DETECTED NOT DETECTED Final   A.calcoaceticus-baumannii NOT DETECTED NOT DETECTED Final   Bacteroides fragilis NOT DETECTED NOT DETECTED Final   Enterobacterales DETECTED (A) NOT DETECTED Final    Comment: Enterobacterales represent a large order of gram negative bacteria, not a single organism. Refer to culture for further identification. CRITICAL RESULT CALLED TO, READ BACK BY AND VERIFIED WITH: M LILLISTON,PHARMD@[email or web address]  08/30/21 [place]    Enterobacter cloacae complex NOT DETECTED NOT DETECTED Final   Escherichia coli NOT DETECTED NOT DETECTED Final   Klebsiella aerogenes NOT DETECTED NOT DETECTED Final   Klebsiella oxytoca NOT DETECTED NOT DETECTED Final   Klebsiella pneumoniae NOT DETECTED NOT DETECTED Final   Proteus species NOT DETECTED NOT DETECTED Final   Salmonella species NOT DETECTED NOT DETECTED Final   Serratia marcescens NOT DETECTED NOT DETECTED Final   Haemophilus influenzae NOT DETECTED NOT DETECTED Final   Neisseria meningitidis NOT DETECTED NOT DETECTED Final   Pseudomonas aeruginosa NOT DETECTED NOT DETECTED Final   Stenotrophomonas maltophilia NOT DETECTED NOT DETECTED Final    Candida albicans NOT DETECTED NOT DETECTED Final   Candida auris NOT DETECTED NOT DETECTED Final   Candida glabrata NOT DETECTED NOT DETECTED Final   Candida krusei NOT DETECTED NOT DETECTED Final   Candida parapsilosis NOT DETECTED NOT DETECTED Final   Candida tropicalis NOT DETECTED NOT DETECTED Final   Cryptococcus neoformans/gattii NOT DETECTED NOT DETECTED Final   CTX-M ESBL DETECTED (A) NOT DETECTED Final    Comment: CRITICAL RESULT CALLED TO, READ BACK BY AND VERIFIED WITH: M LILLISTON,PHARMD@0430  08/30/21 [place] (NOTE) Extended spectrum beta-lactamase detected. Recommend a carbapenem as initial therapy.  Carbapenem resistance IMP NOT DETECTED NOT DETECTED Final   Carbapenem resistance KPC NOT DETECTED NOT DETECTED Final   Carbapenem resistance NDM NOT DETECTED NOT DETECTED Final   Carbapenem resist OXA 48 LIKE NOT DETECTED NOT DETECTED Final   Carbapenem resistance VIM NOT DETECTED NOT DETECTED Final    Comment: Performed at [facility], [place] [address] [place], [place] [address]  Resp Panel by RT-PCR (Flu A&B, Covid) Nasopharyngeal Swab     Status: Abnormal   Collection Time: 08/29/21 11:07 AM   Specimen: Nasopharyngeal Swab; Nasopharyngeal(NP) swabs in vial transport medium  Result Value Ref Range Status   SARS Coronavirus 2 by RT PCR POSITIVE (A) NEGATIVE Final    Comment: (NOTE) SARS-CoV-2 target nucleic acids are DETECTED.  The SARS-CoV-2 RNA is generally detectable in upper respiratory specimens during the acute phase of infection. Positive results are indicative of the presence of the identified virus, but do not rule out bacterial infection or co-infection with other pathogens not detected by the test. Clinical correlation with patient history and other diagnostic information is necessary to determine patient infection status. The expected result is Negative.  Fact Sheet for Patients: EntrepreneurPulse.com.au  Fact Sheet for  Healthcare Providers: IncredibleEmployment.be  This test is not yet approved or cleared by the [place] FDA and  has been authorized for detection and/or diagnosis of SARS-CoV-2 by FDA under an Emergency Use Authorization (EUA).  This EUA will remain in effect (meaning this test can be used) for the duration of  the COVID-19 declaration under Section 564(b)(1) of the A ct, 21 U.S.C. section 360bbb-3(b)(1), unless the authorization is terminated or revoked sooner.     Influenza A by PCR NEGATIVE NEGATIVE Final   Influenza B by PCR NEGATIVE NEGATIVE Final    Comment: (NOTE) The Xpert Xpress SARS-CoV-2/FLU/RSV plus assay is intended as an aid in the diagnosis of influenza from Nasopharyngeal swab specimens and should not be used as a sole basis for treatment. Nasal washings and aspirates are unacceptable for Xpert Xpress SARS-CoV-2/FLU/RSV testing.  Fact Sheet for Patients: EntrepreneurPulse.com.au  Fact Sheet for Healthcare Providers: IncredibleEmployment.be  This test is not yet approved or cleared by the [place] FDA and has been authorized for detection and/or diagnosis of SARS-CoV-2 by FDA under an Emergency Use Authorization (EUA). This EUA will remain in effect (meaning this test can be used) for the duration of the COVID-19 declaration under Section 564(b)(1) of the Act, 21 U.S.C. section 360bbb-3(b)(1), unless the authorization is terminated or revoked.  Performed at [facility], [place] [address] [place], [place] [address]   Urine Culture     Status: Abnormal (Preliminary result)   Collection Time: 08/29/21 11:07 AM   Specimen: In/Out Cath Urine  Result Value Ref Range Status   Specimen Description IN/OUT CATH URINE  Final   Special Requests NONE  Final   Culture (A)  Final    30,000 COLONIES/mL PROTEUS MIRABILIS SUSCEPTIBILITIES TO FOLLOW CULTURE REINCUBATED FOR BETTER GROWTH Performed at [facility], [place] [address] [place], [place] [address]    Report Status PENDING  Incomplete     Terri Piedra, NP [facility]  08/30/2021  10:57 AM

## 2021-08-30 NOTE — Progress Notes (Signed)
PHARMACY - PHYSICIAN COMMUNICATION CRITICAL VALUE ALERT - BLOOD CULTURE IDENTIFICATION (BCID)  Steve [name] is an 30 y.o. male who presented to [facility] on [facility]/29/2021 with a chief complaint of fever & altered mental status  Assessment:   PMH significant for paraplegia, polysubstance abuse, admission in Nov 2022 for MRSA bacteremia, osteomyelitis.  He left AMA with Zyvox prescription- compliance unknown.   Present with fever >104 and admitted with sepsis- VDRF, AKI, acute metabolic encephalopathy, UTI, multiple decubitus ulcers with acute osteo per MRI in Nov 2022, incidental COVID+ with negative CXR.  Blood cx now +GNR, BCID + enterobacterales with ESBL.  Verified with micro lab that no specific organism resulted.    Name of physician (or Provider) Contacted: Ogan  Current antibiotics: Cefepime + Flagyl + Vancomycin IV  Changes to prescribed antibiotics recommended:  Change Cefepime + Flagyl to Meropenem 1gm IV q12h (adjusted for CrCl ~2ml/min) Continue Vancomycin for now given recent hx of MRSA bacteremia and osteomyelitis Recommendations accepted by provider F/U final cx data  Results for orders placed or performed during the hospital encounter of 08/29/21  Blood Culture ID Panel (Reflexed) (Collected: 08/29/2021 10:20 AM)  Result Value Ref Range   Enterococcus faecalis NOT DETECTED NOT DETECTED   Enterococcus Faecium NOT DETECTED NOT DETECTED   Listeria monocytogenes NOT DETECTED NOT DETECTED   Staphylococcus species NOT DETECTED NOT DETECTED   Staphylococcus aureus (BCID) NOT DETECTED NOT DETECTED   Staphylococcus epidermidis NOT DETECTED NOT DETECTED   Staphylococcus lugdunensis NOT DETECTED NOT DETECTED   Streptococcus species NOT DETECTED NOT DETECTED   Streptococcus agalactiae NOT DETECTED NOT DETECTED   Streptococcus pneumoniae NOT DETECTED NOT DETECTED   Streptococcus pyogenes NOT DETECTED NOT DETECTED   A.calcoaceticus-baumannii NOT DETECTED NOT DETECTED    Bacteroides fragilis NOT DETECTED NOT DETECTED   Enterobacterales DETECTED (A) NOT DETECTED   Enterobacter cloacae complex NOT DETECTED NOT DETECTED   Escherichia coli NOT DETECTED NOT DETECTED   Klebsiella aerogenes NOT DETECTED NOT DETECTED   Klebsiella oxytoca NOT DETECTED NOT DETECTED   Klebsiella pneumoniae NOT DETECTED NOT DETECTED   Proteus species NOT DETECTED NOT DETECTED   Salmonella species NOT DETECTED NOT DETECTED   Serratia marcescens NOT DETECTED NOT DETECTED   Haemophilus influenzae NOT DETECTED NOT DETECTED   Neisseria meningitidis NOT DETECTED NOT DETECTED   Pseudomonas aeruginosa NOT DETECTED NOT DETECTED   Stenotrophomonas maltophilia NOT DETECTED NOT DETECTED   Candida albicans NOT DETECTED NOT DETECTED   Candida auris NOT DETECTED NOT DETECTED   Candida glabrata NOT DETECTED NOT DETECTED   Candida krusei NOT DETECTED NOT DETECTED   Candida parapsilosis NOT DETECTED NOT DETECTED   Candida tropicalis NOT DETECTED NOT DETECTED   Cryptococcus neoformans/gattii NOT DETECTED NOT DETECTED   CTX-M ESBL DETECTED (A) NOT DETECTED   Carbapenem resistance IMP NOT DETECTED NOT DETECTED   Carbapenem resistance KPC NOT DETECTED NOT DETECTED   Carbapenem resistance NDM NOT DETECTED NOT DETECTED   Carbapenem resist OXA 48 LIKE NOT DETECTED NOT DETECTED   Carbapenem resistance VIM NOT DETECTED NOT DETECTED    Junita Push PharmD 08/30/2021  4:41 AM

## 2021-08-30 NOTE — Progress Notes (Signed)
Echocardiogram 2D Echocardiogram has been performed.  Warren Lacy Nami Strawder RDCS 08/30/2021, 8:02 AM  Dr. Izora Ribas notified of stat echo

## 2021-08-30 NOTE — Progress Notes (Signed)
eLink Physician-Brief Progress Note Patient Name: Steve [name] DOB: [birth year] MRN: [id number]   Date of Service  08/30/2021  HPI/Events of Note  Patient needs bilateral wrist restraints order for safety. She also needs a wound care consult.  eICU Interventions  Restraints ordered. Wound care consult ordered.        Steve [name] 08/30/2021, 1:48 AM

## 2021-08-30 NOTE — Procedures (Signed)
Extubation Procedure Note  Patient Details:   Name: Steve [name] DOB: [birth year] MRN: [id number]   Airway Documentation:  Airway 8 mm (Active)  Secured at (cm) 24 cm 08/30/21 0756  Measured From Lips 08/30/21 0756  Secured Location Right 08/30/21 0756  Secured By [employer] 08/30/21 0756  Tube Holder Repositioned Yes 08/30/21 0328  Prone position No 08/30/21 0756  Cuff Pressure (cm H2O) Clear OR 27-39 CmH2O 08/30/21 0756  Site Condition Dry 08/30/21 0756   Vent end date: 08/30/21 Vent end time: 0808   Evaluation  O2 sats: stable throughout Complications: No apparent complications Patient did tolerate procedure well. Bilateral Breath Sounds: Diminished   Yes Patient self extubated  Earnstine Regal 08/30/2021, 10:09 AM

## 2021-08-30 NOTE — Progress Notes (Signed)
Patient self extubated around 0808. He is tolerating the nasal cannula well at this time with no apparent distress. He is talking . RT will continue to monitor

## 2021-08-30 NOTE — Progress Notes (Addendum)
NAME:  Steve [name], MRN:  [id number], DOB:  [birth year], LOS: 1 ADMISSION DATE:  08/29/2021, CONSULTATION DATE:  08/29/2021 REFERRING MD:  Dr. 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   Initial work up consistent with septic shock - possible UTI, infected ulcer as source & COVID positive (Cycle time indicates old infection).    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 (Tetraplegia documented, 06/2020, "clothes lined"), decubitus ulcers, anxiety, paranoid schizophrenia, PTSD, and prior infections (sputum with proteus, acinetobacter, urine with klebsiella)  Significant Hospital Events: Including procedures, antibiotic start and stop dates in addition to other pertinent events   12/13 Admitted with sepsis  12/14 Self extubated   Interim History / Subjective:  RN reports pt self extubated, now on room air Afebrile  I/O 650 ml UOP in last 24 hours Remains on levophed, vasopressin, neosynephrine Sedation turned off   Objective   Blood pressure 119/77, pulse 69, temperature 98.4 F (36.9 C), resp. rate 16, height 5\' 9"  (1.753 m), weight 61.2 kg, SpO2 100 %.    Vent Mode: PRVC FiO2 (%):  [30 %-100 %] 30 % Set Rate:  [18 bmp] 18 bmp Vt Set:  [560 mL] 560 mL PEEP:  [5 cmH20] 5 cmH20 Plateau Pressure:  [12 cmH20-19 cmH20] 17 cmH20   Intake/Output Summary (Last 24 hours) at 08/30/2021 0835 Last data filed at 08/30/2021 0538 Gross per 24 hour  Intake 5491.61 ml  Output 650 ml  Net 4841.61 ml   Filed Weights   08/29/21 1012 08/29/21 1300  Weight: 75 kg 61.2 kg    Examination: General:  chronically ill appearing adult male lying in bed in NAD HEENT: MM pink/moist, fair dentition, anicteric, pupils equal /reactive  Neuro:  Awake, alert, oriented to self, asking appropriate questions CV: s1s2 RRR, ST on monitor, no m/r/g PULM: non-labored at rest on RA, lungs bilaterally with good air entry, clear anterior, diminished bases GI: soft, bsx4 hypoactive  Extremities: cool to touch, BLE mottled, changes in LE's consistent with reported hx of paraplegia, no edema  Skin: no rashes.  Large scar on right forearm, tattoos  Resolved Hospital Problem list     Assessment & Plan:   Septic Shock (POA) Possible GNR Bacteremia (1/4 bottles with GNR) Multiple possible sources for infection -UTI, osteomyelitis, COVID. Was admitted in November, left AMA and was discharged on oral zyvox, question compliance. -follow cultures to maturity  -appreciate ID input  -continue daptomycin, meropenem -wean pressors for MAP >66 -stress dose steroids  -add midodrine 10mg  TID  -per Cardiology, LVEF down from prior admit, limited visualization of valves.    Acute Systolic CHF -supportive care  -hold Cardiology consult for now  -will need follow up LVEF  -get neosynephrine off given LVEF  -assess co-ox, repeat lactic acid   UTI (POA) vs Colonization  GNR in pending urine culture, 30k colonies.  Hx klebsiella in urine. -abx as above  -follow cultures -given hx, may be colonized given low colony count   Multiple Decubitus Ulcers (POA)  MRI pelvis 07/28/2021 revealed acute osteomyelitis at the posterior aspect of the ischial tuberosity on the left and left gluteal region decubitus ulcer with extensive underlying associated soft tissue edema likely representing cellulitis. Also deep subcutaneous and muscular edema visualized in  the left pelvis, about the left hip and through the hamstring muscles, suggesting cellulitis and myositis. He left AMA from November admission was given oral Zyvox unsure of compliance  -WOC consulted, appreciate input  -abx as above   Acute Hypoxic Respiratory Failure (POA) COVID (POA) Require bag mask  assistance on ED arrival, poor mental status likely in setting of sepsis / weakness.  CXR does show infiltrates.  COVID is not cause for respiratory decompensation. Self extubated 12/14.   -O2 if needed to support sats >90% -pulmonary hygiene - IS, mobilize  -monitor for development of infiltrates on daptomycin -intermittently follow differential while on dapto  -aspiration precautions -discontinue airborne precautions with CT of 38  Acute Metabolic Encephalopathy  Hx of C-spine injury with Paraplegia  -am exam post extubation appears to be at baseline -supportive care -PT as able  -frequent turning, skin protective measures  -bowel regimen with miralax & colace  -limit all sedating medications   Acute Kidney Injury  Hyponatremia  Hypomagnesemia In the setting of sepsis. Creatinine on admit 1.96, creatinine 08/02/2021 0.61 -Trend BMP / urinary output -Replace electrolytes as indicated -Avoid nephrotoxic agents, ensure adequate renal perfusion -continue sodium bicarbonate infusion at 59ml/hr -assess FENa, Renal US -replace Mg+  Hx of Polysubstance Abuse  Reported hx of meth use and was positive for amphetamines on last admit  -await UDS  -cessation counseling   Anemia  Thrombocytopenia  Suspect thrombocytopenia in setting of sepsis  -Follow CBC   Best Practice (right click and "Reselect all SmartList Selections" daily)  Diet/type: NPO DVT prophylaxis: prophylactic heparin  GI prophylaxis: PPI Lines: Central line Foley:  Yes, and it is still needed Code Status:  full code Last date of multidisciplinary goals of care discussion: pending  Critical care time: 24 minutes    Noe Gens, MSN, APRN, NP-C, AGACNP-BC [facility] 08/30/2021, 8:38 AM   Please see [email or web address] for pager details.   From 7A-7P if no response, please call [phone] After hours, please call Warren Lacy [phone]    Pulmonary critical care attending:  This is a  30 year old gentleman past medical history of cervical spine injury, paraplegia, decubitus ulcer, paranoid schizophrenia presented to the hospital in septic shock, UTI infected ulcer, COVID positive however cycle time indicated old infection  Patient self extubated this morning prior to rounds.  BP 119/77    Pulse 94    Temp 99.1 F (37.3 C)    Resp 17    Ht 5\' 9"  (1.753 m)    Wt 61.2 kg    SpO2 100%    BMI 19.94 kg/m   General: Chronically ill-appearing debilitated young gentleman in the intensive care unit. HEENT: Prior trach scar left subclavian Heart: Regular rhythm S1-S2 Lungs: Clear to auscultation bilaterally, diminished effort and breath sounds Extremities: Muscle wasting present  Labs: Reviewed  Assessment: Septic shock, gram-negative bacteremia Acute systolic heart failure UTI present on admission Multiple decubitus ulcers, present on admission Acute hypoxemic respiratory failure, improved now self extubated, was COVID-positive however do not believe active infection Acute metabolic encephalopathy Cervical spine injury paraplegia AKI, multiple electrolyte abnormalities  Plan: Remains on antibiotics Continue meropenem plus daptomycin, appreciate ID input. Remains on high-dose vasopressors Continue to wean to maintain mean arterial pressure greater than 65 Check Co. ox Echocardiogram results reviewed. Stop phenylephrine Continue sodium bicarbonate infusion. Seems to be protecting his airway just fine.  Remains in ICU for close observation and on high-dose vasopressor.  Would like to avoid reintubation. Appreciate wound care  input.  This patient is critically ill with multiple organ system failure; which, requires frequent high complexity decision making, assessment, support, evaluation, and titration of therapies. This was completed through the application of advanced monitoring technologies and extensive interpretation of multiple databases. During this encounter  critical care time was devoted to patient care services described in this note for 38 minutes.  Josephine Igo, DO [facility] 08/30/2021 4:15 PM

## 2021-08-31 ENCOUNTER — Other Ambulatory Visit: Payer: Self-pay

## 2021-08-31 DIAGNOSIS — E440 Moderate protein-calorie malnutrition: Secondary | ICD-10-CM | POA: Diagnosis present

## 2021-08-31 DIAGNOSIS — M4628 Osteomyelitis of vertebra, sacral and sacrococcygeal region: Secondary | ICD-10-CM

## 2021-08-31 DIAGNOSIS — F200 Paranoid schizophrenia: Secondary | ICD-10-CM

## 2021-08-31 DIAGNOSIS — F1990 Other psychoactive substance use, unspecified, uncomplicated: Secondary | ICD-10-CM

## 2021-08-31 LAB — BASIC METABOLIC PANEL
Anion gap: 14 (ref 5–15)
BUN: 32 mg/dL — ABNORMAL HIGH (ref 6–20)
CO2: 20 mmol/L — ABNORMAL LOW (ref 22–32)
Calcium: 7 mg/dL — ABNORMAL LOW (ref 8.9–10.3)
Chloride: 90 mmol/L — ABNORMAL LOW (ref 98–111)
Creatinine, Ser: 1.67 mg/dL — ABNORMAL HIGH (ref 0.61–1.24)
GFR, Estimated: 56 mL/min — ABNORMAL LOW (ref >60)
Glucose, Bld: 117 mg/dL — ABNORMAL HIGH (ref 70–99)
Potassium: 3.5 mmol/L (ref 3.5–5.1)
Sodium: 124 mmol/L — ABNORMAL LOW (ref 135–145)

## 2021-08-31 LAB — DIC (DISSEMINATED INTRAVASCULAR COAGULATION)PANEL
D-Dimer, Quant: >20 ug/mL-FEU — ABNORMAL HIGH (ref 0.00–0.50)
Fibrinogen: 535 mg/dL — ABNORMAL HIGH (ref 210–475)
INR: 1.5 — ABNORMAL HIGH (ref 0.8–1.2)
Platelets: 34 10*3/uL — ABNORMAL LOW (ref 150–400)
Prothrombin Time: 17.7 seconds — ABNORMAL HIGH (ref 11.4–15.2)
Smear Review: NONE SEEN
aPTT: 51 seconds — ABNORMAL HIGH (ref 24–36)

## 2021-08-31 LAB — CBC
HCT: 31.9 % — ABNORMAL LOW (ref 39.0–52.0)
Hemoglobin: 10.6 g/dL — ABNORMAL LOW (ref 13.0–17.0)
MCH: 27.1 pg (ref 26.0–34.0)
MCHC: 33.2 g/dL (ref 30.0–36.0)
MCV: 81.6 fL (ref 80.0–100.0)
Platelets: 41 10*3/uL — ABNORMAL LOW (ref 150–400)
RBC: 3.91 MIL/uL — ABNORMAL LOW (ref 4.22–5.81)
RDW: 16.7 % — ABNORMAL HIGH (ref 11.5–15.5)
WBC: 21.4 10*3/uL — ABNORMAL HIGH (ref 4.0–10.5)
nRBC: 0 % (ref 0.0–0.2)

## 2021-08-31 LAB — PHOSPHORUS: Phosphorus: 4 mg/dL (ref 2.5–4.6)

## 2021-08-31 LAB — GLUCOSE, CAPILLARY
Glucose-Capillary: 93 mg/dL (ref 70–99)
Glucose-Capillary: 92 mg/dL (ref 70–99)
Glucose-Capillary: 78 mg/dL (ref 70–99)
Glucose-Capillary: 94 mg/dL (ref 70–99)
Glucose-Capillary: 97 mg/dL (ref 70–99)
Glucose-Capillary: 93 mg/dL (ref 70–99)

## 2021-08-31 LAB — DIFFERENTIAL
Abs Immature Granulocytes: 2.04 10*3/uL — ABNORMAL HIGH (ref 0.00–0.07)
Basophils Absolute: 0.1 10*3/uL (ref 0.0–0.1)
Basophils Relative: 0 %
Eosinophils Absolute: 0 10*3/uL (ref 0.0–0.5)
Eosinophils Relative: 0 %
Immature Granulocytes: 10 %
Lymphocytes Relative: 3 %
Lymphs Abs: 0.7 10*3/uL (ref 0.7–4.0)
Monocytes Absolute: 0.5 10*3/uL (ref 0.1–1.0)
Monocytes Relative: 2 %
Neutro Abs: 18.1 10*3/uL — ABNORMAL HIGH (ref 1.7–7.7)
Neutrophils Relative %: 85 %

## 2021-08-31 LAB — CK: Total CK: 2528 U/L — ABNORMAL HIGH (ref 49–397)

## 2021-08-31 LAB — MAGNESIUM: Magnesium: 2.1 mg/dL (ref 1.7–2.4)

## 2021-08-31 MED ORDER — SODIUM CHLORIDE 0.9 % IV SOLN
1.0000 g | Freq: Three times a day (TID) | INTRAVENOUS | Status: DC
  Administered 2021-08-31 – 2021-09-06 (×18): 1 g via INTRAVENOUS
  Filled 2021-08-31 (×20): qty 1

## 2021-08-31 MED ORDER — SODIUM CHLORIDE 0.9 % IV SOLN: INTRAVENOUS | Status: AC

## 2021-08-31 MED ORDER — ENSURE ENLIVE PO LIQD
237.0000 mL | Freq: Three times a day (TID) | ORAL | Status: DC
  Administered 2021-08-31 – 2021-09-08 (×19): 237 mL via ORAL

## 2021-08-31 MED ORDER — ZINC SULFATE 220 (50 ZN) MG PO CAPS
220.0000 mg | ORAL_CAPSULE | Freq: Every day | ORAL | Status: DC
  Administered 2021-08-31 – 2021-09-08 (×9): 220 mg via ORAL
  Filled 2021-08-31 (×9): qty 1

## 2021-08-31 MED ORDER — OLANZAPINE 5 MG PO TBDP
10.0000 mg | ORAL_TABLET | Freq: Every day | ORAL | Status: DC
  Administered 2021-08-31 – 2021-09-06 (×7): 10 mg via ORAL
  Filled 2021-08-31 (×10): qty 2

## 2021-08-31 MED ORDER — SODIUM CHLORIDE 0.9 % IV SOLN
8.0000 mg/kg | Freq: Every day | INTRAVENOUS | Status: AC
  Administered 2021-08-31 – 2021-09-02 (×2): 600 mg via INTRAVENOUS
  Filled 2021-08-31 (×2): qty 12

## 2021-08-31 MED ORDER — ASCORBIC ACID 500 MG PO TABS
500.0000 mg | ORAL_TABLET | Freq: Two times a day (BID) | ORAL | Status: DC
  Administered 2021-08-31 – 2021-09-08 (×16): 500 mg via ORAL
  Filled 2021-08-31 (×16): qty 1

## 2021-08-31 NOTE — Progress Notes (Addendum)
NAME:  Steve [name], MRN:  [id number], DOB:  [birth year], LOS: 2 ADMISSION DATE:  08/29/2021, CONSULTATION DATE:  08/29/2021 REFERRING MD:  Dr. 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   Initial work up consistent with septic shock - possible UTI, infected ulcer as source & COVID positive (Cycle time indicates old infection).    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   Interim History / Subjective:  Remains on vasopressors - levophed 16 mcg, vasopressin   Objective   Blood pressure 111/69, pulse (!) 52, temperature 98.1 F (36.7 C), resp. rate 19, height 5\' 9"  (1.753 m), weight 75.8 kg, SpO2 99 %.        Intake/Output Summary (Last 24 hours) at 08/31/2021 1237 Last data filed at 08/31/2021 1100 Gross per 24 hour  Intake 1167.24 ml  Output 4625 ml  Net -3457.76 ml   Filed Weights   08/29/21 1012 08/29/21 1300 08/30/21 1700  Weight: 75 kg 61.2 kg 75.8 kg    Examination: General: chronically ill appearing adult male lying in bed in NAD  HEENT: MM pink/moist, fair dentition, anicteric Neuro: Awake, alert, oriented to self, confabulation / flight of ideas, disorganized conversation CV: s1s2 RRR, no m/r/g PULM: non-labored at rest, lungs bilaterally clear anterior, few bibasilar crackles  GI: soft, bsx4 active  Extremities: cool/dry, LE  progression of mottling / demarcation, changes consistent with known spinal cord injury / foot drop, muscle wasting   Skin: no rashes or lesions. Multiple tattoos, small bruises/abrasions on skin  Resolved Hospital Problem list     Assessment & Plan:   Septic Shock (POA) GNR Bacteremia  Multiple possible sources for infection - UTI, osteomyelitis, COVID. Was admitted in November, left AMA and was discharged on oral zyvox, question compliance. -cultures with proteus -ID following, continue daptomycin, meropenem  -follow CK while on dapto -wean vasopressors for MAP >65 -stress dose steroids while on pressors  -midodrine TID   Acute Systolic CHF Suspect in setting of sepsis, shock  -wean levophed for MAP >65 -follow up Co-Ox in am  -will need follow up ECHO to review LVEF   Proteus UTI (POA) 30k colonies.  Hx klebsiella in urine. -continue abx per ID    Multiple Decubitus Ulcers (POA)  MRI pelvis 07/28/2021 revealed acute osteomyelitis at the posterior aspect of the ischial tuberosity on the left and left gluteal region decubitus ulcer with extensive underlying associated soft tissue edema likely representing cellulitis. Also deep subcutaneous and muscular edema visualized in the left pelvis, about the left hip and through the hamstring muscles, suggesting cellulitis and myositis. He left AMA from November admission was given oral Zyvox unsure of compliance  -appreciate WOC  -pressure relieving measures   Acute Hypoxic Respiratory Failure (POA) COVID (POA) Require bag mask assistance on ED arrival, poor mental status likely in setting of sepsis / weakness.  CXR does show infiltrates.  COVID is not cause for respiratory decompensation. Self extubated 12/14.  Airborne  discontinued 12/14 with cycle time of 38.  -O2 if needed to maintain sats >90% -pulmonary hygiene -IS, frequent turning -follow intermittent eosinophils while on dapto  -aspiration precautions   Acute Metabolic  Encephalopathy  Hx of C-spine injury with Paraplegia  Hx Schizophrenia, Polysubstance Abuse  Reported hx of meth use and was positive for amphetamines on last admit.  Positive for amphetamines, THC this admit.  Not on home meds for schizophrenia, has not filled prescriptions in 6 months -supportive care, PT as able  -bowel regimen with miralax, colace -continue zyprexa, increase to 10mg  QHS   Acute Kidney Injury  Hyponatremia  Hypomagnesemia In the setting of sepsis. Creatinine on admit 1.96, creatinine 08/02/2021 0.61.  FENa on 12/14 7.7% consistent with ATN > positive for THC, ? Synthetic cannabinoids, sepsis certainly contributing -Trend BMP / urinary output -add NS at 50 ml/hr for 1 liter  -Replace electrolytes as indicated -Avoid nephrotoxic agents, ensure adequate renal perfusion  Hx of Polysubstance Abuse  Reported hx of meth use and was positive for amphetamines on last admit  -cessation counseling when patient able to participate   Anemia  Thrombocytopenia  Suspect thrombocytopenia in setting of sepsis  -trend CBC -assess DIC panel    At Risk Hypoglycemia  -eating regular diet  -change IVF to 50 ml/hr NS given hyponatremia for 1 L -CBG Q4  Best Practice (right click and "Reselect all SmartList Selections" daily)  Diet/type: NPO DVT prophylaxis: prophylactic heparin  GI prophylaxis: PPI Lines: Central line Foley:  Yes, and it is still needed Code Status:  full code Last date of multidisciplinary goals of care discussion: pending. No family at bedside.  Called mother 12/15, Broghan Pannone, message left for return call.   Critical care time: 34 minutes    Elberta Leatherwood, MSN, APRN, NP-C, AGACNP-BC [facility] 08/31/2021, 12:37 PM   Please see [email or web address] for pager details.   From 7A-7P if no response, please call [phone] After hours, please call [facility] [phone]

## 2021-08-31 NOTE — Progress Notes (Addendum)
Pharmacy Antibiotic Note  Steve [name] is a 30 y.o. male admitted on 08/29/2021 with cellulitis.  Pharmacy has been consulted for daptomycin & meropenem  dosing. 08/31/2021 WBC 4.7> 17.4 (12/14)  SCr 1.95> 3.17> 1.67, CrCl ~ 64 LA 6.6> 3.1 Off neo, levo down to 16; on vaso 0.04. extubated BCID w/ ESBL GNR of enterobactorales species UCx 30 K proteus mirabilis Hx MRSA bacteremia/osteo CK elevated @ 2528 - elevated b/c pt found down, will check daily. Threshold for stopping: > 3000. Wt on 12/13 61.2 kg. Wt today 75.8 kg, was probably incorrect on admit  Plan:  increase meropenem to 1 gm IV q8 Change daptomycin to 8 mg/kg IV q24 = 600 mg IV q24 Monitor CK daily for now F/u culture results Monitor renal function, clinical status, and antibiotic plan per ID   Height: 5\' 9"  (175.3 cm) Weight: 75.8 kg (167 lb 1.7 oz) IBW/kg (Calculated) : 70.7  Temp (24hrs), Avg:98.8 F (37.1 C), Min:98.1 F (36.7 C), Max:100.6 F (38.1 C)  Recent Labs  Lab 08/29/21 1020 08/29/21 1211 08/30/21 0355 08/30/21 1203 08/30/21 2245 08/31/21 0602  WBC 4.7  --  17.5*  --   --   --   CREATININE 1.95*  --  3.17*  --   --  1.67*  LATICACIDVEN 7.1* 6.6*  --  4.4* 3.1*  --      Estimated Creatinine Clearance: 64.1 mL/min (A) (by C-G formula based on SCr of 1.67 mg/dL (H)).    Allergies  Allergen Reactions   Caffeine Anaphylaxis   Caffeine Anaphylaxis   Chocolate Anaphylaxis   Chocolate Anaphylaxis   Tuberculin     Other reaction(s): Eruption of skin   Sulfa Antibiotics Rash   Sulfa Antibiotics Rash    Antimicrobials this admission: 12/13 Vanc  >> 12/14 12/13 Cefepime >> 12/14 12/14 meropenem>> 12/13 Flagyl x1 12/14 dapto>> Dose adjustments this admission:  12/15 mero q12> q8 12/15 dapto q48> q24, dose incr w/ new wt Microbiology results:  12/13 Covid+ (CT 38.7) - incidental 12/13 UCx: 30 K proteus mirabilis 12/13 BCx2: GNR, CTX-M ESBL detected on BCID 12/14 MRSA + PTA 08/05/21  BCx: MRSA  Thank you for allowing pharmacy to be a part of this patients care.  08/07/21, Pharm.D 08/31/2021 7:19 AM

## 2021-08-31 NOTE — Progress Notes (Signed)
Critical lab called in to [facility]. Lactic Acid 3.1.

## 2021-08-31 NOTE — Progress Notes (Signed)
[facility]  Date of Admission:  08/29/2021     Total days of antibiotics 3         ASSESSMENT:  Steve [name] fever curve is improving and being weaned off vasopressors.  Suspect new onset rash likely associated with vasopressor. Mental status appears slightly improved today although continues to have auditory hallucinations.  Off Covid precautions theTolerating daptomycin and meropenem. Noted elevated CK levels at 2538. Suspect this is related to sepsis physiology as opposed to daptomycin . Will monitor CK levels closely and continue with daptomycin for now. Awaiting organism identification for bacteremia. Continue wound care per Wound RN recommendations. He will need a prolonged course of antibiotics going forward to treat for previously incomplete treatment of osteomyelitis. Remaining medical and supportive care per CCM.   PLAN:  Continue daptomycin and meropenem. Monitor CK levels and if remain significantly elevated consider change off Daptomycin.  Await organism identification of bacteremia Wound care per Wound RN Remaining medical and supportive care per CCM.   Principal Problem:   Septic shock (HCC) Active Problems:   Pressure injury of skin   Sacral osteomyelitis (HCC)   Gram-negative bacteremia    acetaminophen  650 mg Oral Q6H   Chlorhexidine Gluconate Cloth  6 each Topical Daily   docusate sodium  100 mg Oral BID   heparin  5,000 Units Subcutaneous Q8H   hydrocortisone sod succinate (SOLU-CORTEF) inj  100 mg Intravenous Q8H   mouth rinse  15 mL Mouth Rinse BID   midodrine  10 mg Oral TID WC   OLANZapine zydis  10 mg Oral QHS   polyethylene glycol  17 g Oral Daily    SUBJECTIVE:  Steve [name] temperature of 100.6 F in the last 24 hours with continued leukocytosis of 21.4. New rash noted on bilateral lower extremities. Feeling better today. Continues to have auditory hallucinations.  Allergies  Allergen Reactions   Caffeine Anaphylaxis    Caffeine Anaphylaxis   Chocolate Anaphylaxis   Chocolate Anaphylaxis   Tuberculin     Other reaction(s): Eruption of skin   Sulfa Antibiotics Rash   Sulfa Antibiotics Rash     Review of Systems: Review of Systems  Constitutional:  Negative for chills, fever and weight loss.  Respiratory:  Negative for cough, shortness of breath and wheezing.   Cardiovascular:  Negative for chest pain and leg swelling.  Gastrointestinal:  Negative for abdominal pain, constipation, diarrhea, nausea and vomiting.  Skin:  Negative for rash.     OBJECTIVE: Vitals:   08/31/21 1030 08/31/21 1045 08/31/21 1100 08/31/21 1115  BP: 112/83 108/65 114/74 111/69  Pulse:      Resp:    19  Temp: 97.9 F (36.6 C) 98.1 F (36.7 C) 98.1 F (36.7 C) 98.1 F (36.7 C)  TempSrc:      SpO2:      Weight:      Height:       Body mass index is 24.68 kg/m.  Physical Exam Constitutional:      General: He is not in acute distress.    Appearance: He is well-developed.     Comments: Sitting up in the bed leaning forward  Cardiovascular:     Rate and Rhythm: Regular rhythm. Tachycardia present.     Heart sounds: Normal heart sounds.     Comments: Central line dressing clean and dry Pulmonary:     Effort: Pulmonary effort is normal.     Breath sounds: Normal breath sounds.  Skin:  General: Skin is warm and dry.     Comments: Rash located on bilateral lower extremities appears ischemic with dark purple color. Bilateral lower extremities are cool to the touch.   Neurological:     Mental Status: He is alert.    Lab Results Lab Results  Component Value Date   WBC 21.4 (H) 08/31/2021   HGB 10.6 (L) 08/31/2021   HCT 31.9 (L) 08/31/2021   MCV 81.6 08/31/2021   PLT 41 (L) 08/31/2021    Lab Results  Component Value Date   CREATININE 1.67 (H) 08/31/2021   BUN 32 (H) 08/31/2021   NA 124 (L) 08/31/2021   K 3.5 08/31/2021   CL 90 (L) 08/31/2021   CO2 20 (L) 08/31/2021    Lab Results  Component Value  Date   ALT 11 08/29/2021   AST 23 08/29/2021   ALKPHOS 92 08/29/2021   BILITOT 1.4 (H) 08/29/2021     Microbiology: Recent Results (from the past 240 hour(s))  Blood Culture (routine x 2)     Status: None (Preliminary result)   Collection Time: 08/29/21 10:16 AM   Specimen: BLOOD  Result Value Ref Range Status   Specimen Description BLOOD BLOOD RIGHT HAND  Final   Special Requests   Final    BOTTLES DRAWN AEROBIC AND ANAEROBIC Blood Culture results may not be optimal due to an inadequate volume of blood received in culture bottles   Culture   Final    NO GROWTH 2 DAYS Performed at [facility], [address], [place], [place] [address]    Report Status PENDING  Incomplete  Blood Culture (routine x 2)     Status: None (Preliminary result)   Collection Time: 08/29/21 10:20 AM   Specimen: BLOOD  Result Value Ref Range Status   Specimen Description BLOOD LEFT ANTECUBITAL  Final   Special Requests   Final    BOTTLES DRAWN AEROBIC AND ANAEROBIC Blood Culture adequate volume   Culture  Setup Time   Final    GRAM NEGATIVE RODS AEROBIC BOTTLE ONLY CRITICAL RESULT CALLED TO, READ BACK BY AND VERIFIED WITH: M LILLISTON,PHARMD@0428  08/30/21 MK    Culture   Final    GRAM NEGATIVE RODS CULTURE REINCUBATED FOR BETTER GROWTH Performed at [facility], [address] [place], [place] [address]    Report Status PENDING  Incomplete  Blood Culture ID Panel (Reflexed)     Status: Abnormal   Collection Time: 08/29/21 10:20 AM  Result Value Ref Range Status   Enterococcus faecalis NOT DETECTED NOT DETECTED Final   Enterococcus Faecium NOT DETECTED NOT DETECTED Final   Listeria monocytogenes NOT DETECTED NOT DETECTED Final   Staphylococcus species NOT DETECTED NOT DETECTED Final   Staphylococcus aureus (BCID) NOT DETECTED NOT DETECTED Final   Staphylococcus epidermidis NOT DETECTED NOT DETECTED Final   Staphylococcus lugdunensis NOT DETECTED NOT DETECTED Final   Streptococcus  species NOT DETECTED NOT DETECTED Final   Streptococcus agalactiae NOT DETECTED NOT DETECTED Final   Streptococcus pneumoniae NOT DETECTED NOT DETECTED Final   Streptococcus pyogenes NOT DETECTED NOT DETECTED Final   A.calcoaceticus-baumannii NOT DETECTED NOT DETECTED Final   Bacteroides fragilis NOT DETECTED NOT DETECTED Final   Enterobacterales DETECTED (A) NOT DETECTED Final    Comment: Enterobacterales represent a large order of gram negative bacteria, not a single organism. Refer to culture for further identification. CRITICAL RESULT CALLED TO, READ BACK BY AND VERIFIED WITH: M LILLISTON,PHARMD@0430  08/30/21 MK    Enterobacter  cloacae complex NOT DETECTED NOT DETECTED Final   Escherichia coli NOT DETECTED NOT DETECTED Final   Klebsiella aerogenes NOT DETECTED NOT DETECTED Final   Klebsiella oxytoca NOT DETECTED NOT DETECTED Final   Klebsiella pneumoniae NOT DETECTED NOT DETECTED Final   Proteus species NOT DETECTED NOT DETECTED Final   Salmonella species NOT DETECTED NOT DETECTED Final   Serratia marcescens NOT DETECTED NOT DETECTED Final   Haemophilus influenzae NOT DETECTED NOT DETECTED Final   Neisseria meningitidis NOT DETECTED NOT DETECTED Final   Pseudomonas aeruginosa NOT DETECTED NOT DETECTED Final   Stenotrophomonas maltophilia NOT DETECTED NOT DETECTED Final   Candida albicans NOT DETECTED NOT DETECTED Final   Candida auris NOT DETECTED NOT DETECTED Final   Candida glabrata NOT DETECTED NOT DETECTED Final   Candida krusei NOT DETECTED NOT DETECTED Final   Candida parapsilosis NOT DETECTED NOT DETECTED Final   Candida tropicalis NOT DETECTED NOT DETECTED Final   Cryptococcus neoformans/gattii NOT DETECTED NOT DETECTED Final   CTX-M ESBL DETECTED (A) NOT DETECTED Final    Comment: CRITICAL RESULT CALLED TO, READ BACK BY AND VERIFIED WITH: M LILLISTON,PHARMD@0430  08/30/21 MK (NOTE) Extended spectrum beta-lactamase detected. Recommend a carbapenem as initial  therapy.      Carbapenem resistance IMP NOT DETECTED NOT DETECTED Final   Carbapenem resistance KPC NOT DETECTED NOT DETECTED Final   Carbapenem resistance NDM NOT DETECTED NOT DETECTED Final   Carbapenem resist OXA 48 LIKE NOT DETECTED NOT DETECTED Final   Carbapenem resistance VIM NOT DETECTED NOT DETECTED Final    Comment: Performed at [facility], [address] [place], [place] [address]  Resp Panel by RT-PCR (Flu A&B, Covid) Nasopharyngeal Swab     Status: Abnormal   Collection Time: 08/29/21 11:07 AM   Specimen: Nasopharyngeal Swab; Nasopharyngeal(NP) swabs in vial transport medium  Result Value Ref Range Status   SARS Coronavirus 2 by RT PCR POSITIVE (A) NEGATIVE Final    Comment: (NOTE) SARS-CoV-2 target nucleic acids are DETECTED.  The SARS-CoV-2 RNA is generally detectable in upper respiratory specimens during the acute phase of infection. Positive results are indicative of the presence of the identified virus, but do not rule out bacterial infection or co-infection with other pathogens not detected by the test. Clinical correlation with patient history and other diagnostic information is necessary to determine patient infection status. The expected result is Negative.  Fact Sheet for Patients: BloggerCourse.com  Fact Sheet for Healthcare Providers: SeriousBroker.it  This test is not yet approved or cleared by the [place] FDA and  has been authorized for detection and/or diagnosis of SARS-CoV-2 by FDA under an Emergency Use Authorization (EUA).  This EUA will remain in effect (meaning this test can be used) for the duration of  the COVID-19 declaration under Section 564(b)(1) of the A ct, 21 U.S.C. section 360bbb-3(b)(1), unless the authorization is terminated or revoked sooner.     Influenza A by PCR NEGATIVE NEGATIVE Final   Influenza B by PCR NEGATIVE NEGATIVE Final    Comment: (NOTE) The Xpert  Xpress SARS-CoV-2/FLU/RSV plus assay is intended as an aid in the diagnosis of influenza from Nasopharyngeal swab specimens and should not be used as a sole basis for treatment. Nasal washings and aspirates are unacceptable for Xpert Xpress SARS-CoV-2/FLU/RSV testing.  Fact Sheet for Patients: BloggerCourse.com  Fact Sheet for Healthcare Providers: SeriousBroker.it  This test is not yet approved or cleared by the [place] FDA and has been authorized for detection and/or diagnosis of SARS-CoV-2  by FDA under an Emergency Use Authorization (EUA). This EUA will remain in effect (meaning this test can be used) for the duration of the COVID-19 declaration under Section 564(b)(1) of the Act, 21 U.S.C. section 360bbb-3(b)(1), unless the authorization is terminated or revoked.  Performed at [facility], [address], [place], [place] [address]   Urine Culture     Status: Abnormal (Preliminary result)   Collection Time: 08/29/21 11:07 AM   Specimen: In/Out Cath Urine  Result Value Ref Range Status   Specimen Description IN/OUT CATH URINE  Final   Special Requests NONE  Final   Culture (A)  Final    30,000 COLONIES/mL PROTEUS MIRABILIS SUSCEPTIBILITIES TO FOLLOW REPEATING CULTURE REINCUBATED FOR BETTER GROWTH Performed at [facility], [address] [place], [place] [address]    Report Status PENDING  Incomplete  MRSA Next Gen by PCR, Nasal     Status: Abnormal   Collection Time: 08/30/21 11:58 AM   Specimen: Nasal Mucosa; Nasal Swab  Result Value Ref Range Status   MRSA by PCR Next Gen DETECTED (A) NOT DETECTED Final    Comment: RESULT CALLED TO, READ BACK BY AND VERIFIED WITH: SCOTT,B. RN AT 1401 08/30/21 MULLINS,T (NOTE) The GeneXpert MRSA Assay (FDA approved for NASAL specimens only), is one component of a comprehensive MRSA colonization surveillance program. It is not intended to diagnose MRSA infection nor  to guide or monitor treatment for MRSA infections. Test performance is not FDA approved in patients less than 3 years old. Performed at [facility], [address] [place], [place] [address]      Marcos Eke, NP [facility]  08/31/2021  11:40 AM
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
## 2021-08-31 NOTE — Progress Notes (Signed)
eLink Physician-Brief Progress Note Patient Name: Steve [name] DOB: September 01, 1991 MRN: [id number]   Date of Service  08/31/2021  HPI/Events of Note  Lactic acid trending down.  eICU Interventions  Continue current Rx.        Thomasene Lot [place] Journey 08/31/2021, 1:29 AM
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
## 2021-09-01 ENCOUNTER — Other Ambulatory Visit (HOSPITAL_COMMUNITY): Payer: Self-pay

## 2021-09-01 LAB — BASIC METABOLIC PANEL
Anion gap: 12 (ref 5–15)
BUN: 29 mg/dL — ABNORMAL HIGH (ref 6–20)
CO2: 23 mmol/L (ref 22–32)
Calcium: 7.6 mg/dL — ABNORMAL LOW (ref 8.9–10.3)
Chloride: 87 mmol/L — ABNORMAL LOW (ref 98–111)
Creatinine, Ser: 0.8 mg/dL (ref 0.61–1.24)
GFR, Estimated: >60 mL/min (ref >60)
Glucose, Bld: 100 mg/dL — ABNORMAL HIGH (ref 70–99)
Potassium: 2.7 mmol/L — CL (ref 3.5–5.1)
Sodium: 122 mmol/L — ABNORMAL LOW (ref 135–145)

## 2021-09-01 LAB — CBC
HCT: 27.6 % — ABNORMAL LOW (ref 39.0–52.0)
Hemoglobin: 9.4 g/dL — ABNORMAL LOW (ref 13.0–17.0)
MCH: 27.6 pg (ref 26.0–34.0)
MCHC: 34.1 g/dL (ref 30.0–36.0)
MCV: 81.2 fL (ref 80.0–100.0)
Platelets: 34 10*3/uL — ABNORMAL LOW (ref 150–400)
RBC: 3.4 MIL/uL — ABNORMAL LOW (ref 4.22–5.81)
RDW: 16.5 % — ABNORMAL HIGH (ref 11.5–15.5)
WBC: 22.3 10*3/uL — ABNORMAL HIGH (ref 4.0–10.5)
nRBC: 0 % (ref 0.0–0.2)

## 2021-09-01 LAB — GLUCOSE, CAPILLARY
Glucose-Capillary: 73 mg/dL (ref 70–99)
Glucose-Capillary: 100 mg/dL — ABNORMAL HIGH (ref 70–99)

## 2021-09-01 LAB — CK: Total CK: 654 U/L — ABNORMAL HIGH (ref 49–397)

## 2021-09-01 LAB — URINE CULTURE: Culture: 30000 — AB

## 2021-09-01 LAB — COOXEMETRY PANEL
Carboxyhemoglobin: 1.4 % (ref 0.5–1.5)
Methemoglobin: 1.1 % (ref 0.0–1.5)
O2 Saturation: 69.3 %
Total hemoglobin: 9.5 g/dL — ABNORMAL LOW (ref 12.0–16.0)

## 2021-09-01 LAB — PHOSPHORUS: Phosphorus: 3.1 mg/dL (ref 2.5–4.6)

## 2021-09-01 LAB — MAGNESIUM: Magnesium: 1.9 mg/dL (ref 1.7–2.4)

## 2021-09-01 MED ORDER — ORITAVANCIN DIPHOSPHATE 400 MG IV SOLR
1200.0000 mg | Freq: Once | INTRAVENOUS | Status: AC
  Administered 2021-09-02: 1200 mg via INTRAVENOUS
  Filled 2021-09-01: qty 120

## 2021-09-01 MED ORDER — MAGNESIUM SULFATE IN D5W 1-5 GM/100ML-% IV SOLN
1.0000 g | Freq: Once | INTRAVENOUS | Status: AC
  Administered 2021-09-01: 1 g via INTRAVENOUS
  Filled 2021-09-01: qty 100

## 2021-09-01 MED ORDER — POTASSIUM CHLORIDE 10 MEQ/50ML IV SOLN
10.0000 meq | INTRAVENOUS | Status: AC
  Administered 2021-09-01 (×4): 10 meq via INTRAVENOUS
  Filled 2021-09-01 (×4): qty 50

## 2021-09-01 MED ORDER — MUPIROCIN 2 % EX OINT
1.0000 "application " | TOPICAL_OINTMENT | Freq: Two times a day (BID) | CUTANEOUS | Status: AC
  Administered 2021-09-01 – 2021-09-05 (×10): 1 via NASAL
  Filled 2021-09-01: qty 22

## 2021-09-01 MED ORDER — POTASSIUM CHLORIDE CRYS ER 20 MEQ PO TBCR
40.0000 meq | EXTENDED_RELEASE_TABLET | Freq: Once | ORAL | Status: AC
  Administered 2021-09-01: 40 meq via ORAL
  Filled 2021-09-01: qty 2

## 2021-09-01 MED ORDER — ALUM & MAG HYDROXIDE-SIMETH 200-200-20 MG/5ML PO SUSP
30.0000 mL | Freq: Four times a day (QID) | ORAL | Status: DC | PRN
  Administered 2021-09-01 – 2021-09-04 (×7): 30 mL via ORAL
  Filled 2021-09-01 (×7): qty 30

## 2021-09-01 NOTE — Progress Notes (Signed)
eLink Physician-Brief Progress Note Patient Name: Steve [name] DOB: 12-Jul-1991 MRN: [id number]   Date of Service  09/01/2021  HPI/Events of Note  K+  2.7  eICU Interventions  KCL replacement ordered per E-Link electrolyte replacement protocol.        [name] 09/01/2021, 6:21 AM

## 2021-09-01 NOTE — Consult Note (Signed)
WOC consulted for use of Santyl, however patient was seen 2 days ago by my partner.  Detailed notes from 08/30/21. I have verified with bedside nursing staff no acute changes except the LE which are mottled and have color changes with apparent ischemia from the use of vasopressors.  No acute changes with ischial pressure injury.  Patient does have deep tissue pressure injury on his sacrum and I have explained unless this wound evolves to eschar we would not use an enzymatic debrider. Would only use if the tissue is open and necrotic.   Recommended continued assessment and monitoring of current wounds and re-consultation of the WOC nurse for acute changes or new onset of DTPI, Stage 3 or 4 or Unstageable Pressure injury.   Steve [name] [facility], CNS, [employer] [phone]

## 2021-09-01 NOTE — Progress Notes (Addendum)
NAME:  Steve [name], MRN:  [id number], DOB:  04-11-1991, LOS: 3 ADMISSION DATE:  08/29/2021, CONSULTATION DATE:  08/29/2021 REFERRING MD:  Dr. 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   Initial work up consistent with septic shock - possible UTI, infected ulcer as source & COVID positive (Cycle time indicates old infection).    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 BLE mottled 12/15 Zyprexa increased, remains on 16mcg levo + vaso. BLE with dark purple areas, non-blanching  12/16 Improved mental status, on 25mcg levo, improved appearance of LE's   Interim History / Subjective:  Vasopressor need significantly down Mental status improved  Afebrile  Tolerating PO's   Objective   Blood pressure (!) 124/92, pulse 91, temperature 97.6 F (36.4 C), temperature source Axillary, resp. rate (!) 21, height 5\' 9"  (1.753 m), weight 68.9 kg, SpO2 97 %.        Intake/Output Summary (Last 24 hours) at 09/01/2021 0746 Last data filed at 09/01/2021 [id number] Gross per 24 hour  Intake 1721.38 ml  Output 2415 ml  Net -693.62 ml   Filed Weights   08/29/21 1300 08/30/21 1700 09/01/21 0500  Weight: 61.2 kg 75.8 kg 68.9 kg    Examination: General: chronically ill appearing adult male lying in bed in NAD, family at bedside  HEENT: MM pink/moist, fair dentition,  anicteric Neuro: Awake, alert, oriented to self, speech improved, flight of ideas improved / conversation more organized. Gross motor of BUE's R>L  CV: s1s2 RRR, no m/r/g PULM: non-labored at rest, lungs bilaterally clear  GI: soft, bsx4 active  Extremities: warm/dry, no edema.  BLE changes consistent with hx of spinal cort injury / foot drop/muscle wasting Skin: multiple tattoos, BLE's with areas of non-blanching purple discoloration largely concentrated in feet.  Improved from 12/15 exam   Resolved Hospital Problem list     Assessment & Plan:   Septic Shock (POA) secondary to Providencia Rettgeri Bacteremia  Multiple possible sources for infection - UTI, osteomyelitis, COVID. Was admitted in November, left AMA and was discharged on oral zyvox, question compliance.   -appreciate ID input  -continue meropenem IV, dapto > oritavancin  -CK improving, follow tremd -wean levophed to off for MAP >65  -continue stress dose steroids while on pressors  -midodrine TID   Acute Systolic CHF Suspect in setting of sepsis, shock  -levophed for MAP >65  -co-ox 69% -will need follow up limited ECHO to ensure recovery of LVEF   Proteus UTI (POA) 30k colonies.  Hx klebsiella in urine. -abx per ID  -suspect urine culture did not pick up providencia ??, ? Wounds as source   Multiple Decubitus Ulcers (POA)  MRI pelvis 07/28/2021 revealed acute osteomyelitis at the posterior aspect of the ischial tuberosity on the left and left gluteal region decubitus ulcer with extensive underlying associated soft tissue edema likely representing cellulitis. Also deep subcutaneous and muscular edema visualized in the left pelvis, about the left hip and through  the hamstring muscles, suggesting cellulitis and myositis. He left AMA from November admission was given oral Zyvox unsure of compliance  -will request WOC to reassess patient wounds  -pressure relieving measures   Acute Hypoxic Respiratory Failure  (POA) COVID (POA) Require bag mask assistance on ED arrival, poor mental status likely in setting of sepsis / weakness.  CXR does show infiltrates.  COVID is not cause for respiratory decompensation. Self extubated 12/14.  Airborne discontinued 12/14 with cycle time of 38.  -O2 if needed to maintain sats >90% -pulmonary hygiene -IS, mobilize as able, frequent turning  -aspiration precautions   Acute Metabolic Encephalopathy  Hx of C-spine injury with Paraplegia  Hx Schizophrenia, Polysubstance Abuse  Reported hx of meth use and was positive for amphetamines on last admit.  Positive for amphetamines, THC this admit.  Not on home meds for schizophrenia, has not filled prescriptions in 6 months -supportive care, improved overall  -bowel regimen with miralax, colace  -continue zyprexa 10 mg QHS   Acute Kidney Injury  Hyponatremia  Hypokalemia Hypomagnesemia In the setting of sepsis. Creatinine on admit 1.96, creatinine 08/02/2021 0.61.  FENa on 12/14 7.7% consistent with ATN > positive for THC, ? Synthetic cannabinoids, sepsis certainly contributing -continue NS at 50 ml/hr until MN  -Trend BMP / urinary output -Replace electrolytes as indicated > K/Mg+ 12/16 -Avoid nephrotoxic agents, ensure adequate renal perfusion  Hx of Polysubstance Abuse  Reported hx of meth use and was positive for amphetamines on last admit  -cessation counseling  Anemia  Thrombocytopenia  Suspect thrombocytopenia in setting of sepsis. DIC panel negative.   -supportive care     At Risk Hypoglycemia  -regular diet as tolerated -NS as above  -CBG Q4  Best Practice (right click and "Reselect all SmartList Selections" daily)  Diet/type: Regular consistency (see orders) DVT prophylaxis: prophylactic heparin  GI prophylaxis: PPI Lines: Central line Foley:  Yes, and it is still needed Code Status:  full code Last date of multidisciplinary goals of care discussion: Wife updated at bedside 12/16 on plan of  care.  Dispo: pending weaning of vasopressors, likely to SDU, TRH as of 12/17  Critical care time: 33 minutes     [name], MSN, APRN, NP-C, AGACNP-BC [facility] 09/01/2021, 7:46 AM   Please see [email or web address] for pager details.   From 7A-7P if no response, please call [phone] After hours, please call ELink [phone]

## 2021-09-01 NOTE — Progress Notes (Signed)
[facility]  Date of Admission:  08/29/2021     Total days of antibiotics 4         ASSESSMENT:  Steve [name] mental status is improved. Blood cultures are positive for Providencia rettgeri which appears to be multi-drug resistant with sensitivity to meropenem and ciprofloxacin. Vasopressor support being weaned. I do have concern about  his willingness to stay for treatment. Will continue daptomycin through tomorrow and schedule a dose of oritivancin and pharmacy will check about second dose of oritivancin as outpatient for total treatment of 4 weeks for previous osteomyelitis as he appears to be improving. CK levels as anticipated continue to trend down. Continue current dose of meropenem and can likely treat for short course for Providencia infection. If he decides to leave can treat with levofloxacin. Rash appears slightly improved from yesterday. Remaining medical and supportive care per CCM.   PLAN:  Continue meropenem Continue daptomycin through tomorrow then one dose of oritivancin. Check inflammatory markers for baseline Remaining medical and supportive care per CCM.   Dr. Luciana Axe will follow up this afternoon and Dr. Elinor Parkinson will be available over the weekend for ID related questions.   Principal Problem:   Septic shock (HCC) Active Problems:   Pressure injury of skin   Sacral osteomyelitis (HCC)   Gram-negative bacteremia   Malnutrition of moderate degree    vitamin C  500 mg Oral BID   Chlorhexidine Gluconate Cloth  6 each Topical Daily   docusate sodium  100 mg Oral BID   feeding supplement  237 mL Oral TID BM   heparin  5,000 Units Subcutaneous Q8H   hydrocortisone sod succinate (SOLU-CORTEF) inj  100 mg Intravenous Q8H   mouth rinse  15 mL Mouth Rinse BID   midodrine  10 mg Oral TID WC   OLANZapine zydis  10 mg Oral QHS   polyethylene glycol  17 g Oral Daily   zinc sulfate  220 mg Oral Daily    SUBJECTIVE:  Afebrile overnight  with no acute events. Mental status is improved. Feeling better today.   Allergies  Allergen Reactions   Caffeine Anaphylaxis   Caffeine Anaphylaxis   Chocolate Anaphylaxis   Chocolate Anaphylaxis   Tuberculin     Other reaction(s): Eruption of skin   Sulfa Antibiotics Rash   Sulfa Antibiotics Rash     Review of Systems: Review of Systems  Constitutional:  Negative for chills, fever and weight loss.  Respiratory:  Negative for cough, shortness of breath and wheezing.   Cardiovascular:  Negative for chest pain and leg swelling.  Gastrointestinal:  Negative for abdominal pain, constipation, diarrhea, nausea and vomiting.  Skin:  Negative for rash.     OBJECTIVE: Vitals:   09/01/21 0615 09/01/21 0630 09/01/21 0645 09/01/21 0700  BP: 107/73 112/68 (!) 124/92 113/73  Pulse:      Resp: 14 13 (!) 21 (!) 21  Temp:      TempSrc:      SpO2:      Weight:      Height:       Body mass index is 22.43 kg/m.  Physical Exam Constitutional:      General: He is not in acute distress.    Appearance: He is well-developed.  Cardiovascular:     Rate and Rhythm: Normal rate and regular rhythm.     Heart sounds: Normal heart sounds.  Pulmonary:     Effort: Pulmonary effort is normal.  Breath sounds: Normal breath sounds.  Skin:    General: Skin is warm and dry.  Neurological:     Mental Status: He is alert and oriented to person, place, and time.  Psychiatric:        Mood and Affect: Mood normal.    Lab Results Lab Results  Component Value Date   WBC 22.3 (H) 09/01/2021   HGB 9.4 (L) 09/01/2021   HCT 27.6 (L) 09/01/2021   MCV 81.2 09/01/2021   PLT 34 (L) 09/01/2021    Lab Results  Component Value Date   CREATININE 0.80 09/01/2021   BUN 29 (H) 09/01/2021   NA 122 (L) 09/01/2021   K 2.7 (LL) 09/01/2021   CL 87 (L) 09/01/2021   CO2 23 09/01/2021    Lab Results  Component Value Date   ALT 11 08/29/2021   AST 23 08/29/2021   ALKPHOS 92 08/29/2021   BILITOT 1.4  (H) 08/29/2021     Microbiology: Recent Results (from the past 240 hour(s))  Blood Culture (routine x 2)     Status: None (Preliminary result)   Collection Time: 08/29/21 10:16 AM   Specimen: BLOOD  Result Value Ref Range Status   Specimen Description BLOOD BLOOD RIGHT HAND  Final   Special Requests   Final    BOTTLES DRAWN AEROBIC AND ANAEROBIC Blood Culture results may not be optimal due to an inadequate volume of blood received in culture bottles   Culture   Final    NO GROWTH 3 DAYS Performed at [facility], [address] [place], [place] [address]    Report Status PENDING  Incomplete  Blood Culture (routine x 2)     Status: Abnormal (Preliminary result)   Collection Time: 08/29/21 10:20 AM   Specimen: BLOOD  Result Value Ref Range Status   Specimen Description BLOOD LEFT ANTECUBITAL  Final   Special Requests   Final    BOTTLES DRAWN AEROBIC AND ANAEROBIC Blood Culture adequate volume   Culture  Setup Time   Final    GRAM NEGATIVE RODS IN BOTH AEROBIC AND ANAEROBIC BOTTLES CRITICAL RESULT CALLED TO, READ BACK BY AND VERIFIED WITH: M LILLISTON,PHARMD@0428  08/30/21 MK Performed at [facility], [address], [place], [place] [address]    Culture PROVIDENCIA RETTGERI (A)  Final   Report Status PENDING  Incomplete   Organism ID, Bacteria PROVIDENCIA RETTGERI  Final      Susceptibility   Providencia rettgeri - MIC*    AMPICILLIN >=32 RESISTANT Resistant     CEFAZOLIN >=64 RESISTANT Resistant     CEFEPIME 16 RESISTANT Resistant     CEFTAZIDIME >=64 RESISTANT Resistant     CIPROFLOXACIN <=0.25 SENSITIVE Sensitive     GENTAMICIN >=16 RESISTANT Resistant     IMIPENEM 0.5 SENSITIVE Sensitive     TRIMETH/SULFA >=320 RESISTANT Resistant     AMPICILLIN/SULBACTAM >=32 RESISTANT Resistant     PIP/TAZO <=4 SENSITIVE Sensitive     * PROVIDENCIA RETTGERI  Blood Culture ID Panel (Reflexed)     Status: Abnormal   Collection Time: 08/29/21 10:20 AM  Result Value Ref  Range Status   Enterococcus faecalis NOT DETECTED NOT DETECTED Final   Enterococcus Faecium NOT DETECTED NOT DETECTED Final   Listeria monocytogenes NOT DETECTED NOT DETECTED Final   Staphylococcus species NOT DETECTED NOT DETECTED Final   Staphylococcus aureus (BCID) NOT DETECTED NOT DETECTED Final   Staphylococcus epidermidis NOT DETECTED NOT DETECTED Final   Staphylococcus lugdunensis NOT DETECTED NOT  DETECTED Final   Streptococcus species NOT DETECTED NOT DETECTED Final   Streptococcus agalactiae NOT DETECTED NOT DETECTED Final   Streptococcus pneumoniae NOT DETECTED NOT DETECTED Final   Streptococcus pyogenes NOT DETECTED NOT DETECTED Final   A.calcoaceticus-baumannii NOT DETECTED NOT DETECTED Final   Bacteroides fragilis NOT DETECTED NOT DETECTED Final   Enterobacterales DETECTED (A) NOT DETECTED Final    Comment: Enterobacterales represent a large order of gram negative bacteria, not a single organism. Refer to culture for further identification. CRITICAL RESULT CALLED TO, READ BACK BY AND VERIFIED WITH: M LILLISTON,PHARMD@0430  08/30/21 MK    Enterobacter cloacae complex NOT DETECTED NOT DETECTED Final   Escherichia coli NOT DETECTED NOT DETECTED Final   Klebsiella aerogenes NOT DETECTED NOT DETECTED Final   Klebsiella oxytoca NOT DETECTED NOT DETECTED Final   Klebsiella pneumoniae NOT DETECTED NOT DETECTED Final   Proteus species NOT DETECTED NOT DETECTED Final   Salmonella species NOT DETECTED NOT DETECTED Final   Serratia marcescens NOT DETECTED NOT DETECTED Final   Haemophilus influenzae NOT DETECTED NOT DETECTED Final   Neisseria meningitidis NOT DETECTED NOT DETECTED Final   Pseudomonas aeruginosa NOT DETECTED NOT DETECTED Final   Stenotrophomonas maltophilia NOT DETECTED NOT DETECTED Final   Candida albicans NOT DETECTED NOT DETECTED Final   Candida auris NOT DETECTED NOT DETECTED Final   Candida glabrata NOT DETECTED NOT DETECTED Final   Candida krusei NOT DETECTED  NOT DETECTED Final   Candida parapsilosis NOT DETECTED NOT DETECTED Final   Candida tropicalis NOT DETECTED NOT DETECTED Final   Cryptococcus neoformans/gattii NOT DETECTED NOT DETECTED Final   CTX-M ESBL DETECTED (A) NOT DETECTED Final    Comment: CRITICAL RESULT CALLED TO, READ BACK BY AND VERIFIED WITH: M LILLISTON,PHARMD@0430  08/30/21 MK (NOTE) Extended spectrum beta-lactamase detected. Recommend a carbapenem as initial therapy.      Carbapenem resistance IMP NOT DETECTED NOT DETECTED Final   Carbapenem resistance KPC NOT DETECTED NOT DETECTED Final   Carbapenem resistance NDM NOT DETECTED NOT DETECTED Final   Carbapenem resist OXA 48 LIKE NOT DETECTED NOT DETECTED Final   Carbapenem resistance VIM NOT DETECTED NOT DETECTED Final    Comment: Performed at [facility], [address] [place], [place] [address]  Resp Panel by RT-PCR (Flu A&B, Covid) Nasopharyngeal Swab     Status: Abnormal   Collection Time: 08/29/21 11:07 AM   Specimen: Nasopharyngeal Swab; Nasopharyngeal(NP) swabs in vial transport medium  Result Value Ref Range Status   SARS Coronavirus 2 by RT PCR POSITIVE (A) NEGATIVE Final    Comment: (NOTE) SARS-CoV-2 target nucleic acids are DETECTED.  The SARS-CoV-2 RNA is generally detectable in upper respiratory specimens during the acute phase of infection. Positive results are indicative of the presence of the identified virus, but do not rule out bacterial infection or co-infection with other pathogens not detected by the test. Clinical correlation with patient history and other diagnostic information is necessary to determine patient infection status. The expected result is Negative.  Fact Sheet for Patients: BloggerCourse.com  Fact Sheet for Healthcare Providers: SeriousBroker.it  This test is not yet approved or cleared by the [place] FDA and  has been authorized for detection and/or diagnosis  of SARS-CoV-2 by FDA under an Emergency Use Authorization (EUA).  This EUA will remain in effect (meaning this test can be used) for the duration of  the COVID-19 declaration under Section 564(b)(1) of the A ct, 21 U.S.C. section 360bbb-3(b)(1), unless the authorization is terminated or revoked sooner.  Influenza A by PCR NEGATIVE NEGATIVE Final   Influenza B by PCR NEGATIVE NEGATIVE Final    Comment: (NOTE) The Xpert Xpress SARS-CoV-2/FLU/RSV plus assay is intended as an aid in the diagnosis of influenza from Nasopharyngeal swab specimens and should not be used as a sole basis for treatment. Nasal washings and aspirates are unacceptable for Xpert Xpress SARS-CoV-2/FLU/RSV testing.  Fact Sheet for Patients: BloggerCourse.com  Fact Sheet for Healthcare Providers: SeriousBroker.it  This test is not yet approved or cleared by the [place] FDA and has been authorized for detection and/or diagnosis of SARS-CoV-2 by FDA under an Emergency Use Authorization (EUA). This EUA will remain in effect (meaning this test can be used) for the duration of the COVID-19 declaration under Section 564(b)(1) of the Act, 21 U.S.C. section 360bbb-3(b)(1), unless the authorization is terminated or revoked.  Performed at [facility], [address] [place], [place] [address]   Urine Culture     Status: Abnormal   Collection Time: 08/29/21 11:07 AM   Specimen: In/Out Cath Urine  Result Value Ref Range Status   Specimen Description IN/OUT CATH URINE  Final   Special Requests NONE  Final   Culture (A)  Final    30,000 COLONIES/mL PROTEUS MIRABILIS WITHIN MIXED CULTURE Performed at [facility], [address] [place], [place] [address]    Report Status 09/01/2021 FINAL  Final   Organism ID, Bacteria PROTEUS MIRABILIS (A)  Final      Susceptibility   Proteus mirabilis - MIC*    AMPICILLIN <=2 SENSITIVE Sensitive     CEFAZOLIN  <=4 SENSITIVE Sensitive     CEFEPIME <=0.12 SENSITIVE Sensitive     CEFTRIAXONE <=0.25 SENSITIVE Sensitive     CIPROFLOXACIN <=0.25 SENSITIVE Sensitive     GENTAMICIN <=1 SENSITIVE Sensitive     IMIPENEM 2 SENSITIVE Sensitive     NITROFURANTOIN RESISTANT Resistant     TRIMETH/SULFA <=20 SENSITIVE Sensitive     AMPICILLIN/SULBACTAM <=2 SENSITIVE Sensitive     PIP/TAZO <=4 SENSITIVE Sensitive     * 30,000 COLONIES/mL PROTEUS MIRABILIS  MRSA Next Gen by PCR, Nasal     Status: Abnormal   Collection Time: 08/30/21 11:58 AM   Specimen: Nasal Mucosa; Nasal Swab  Result Value Ref Range Status   MRSA by PCR Next Gen DETECTED (A) NOT DETECTED Final    Comment: RESULT CALLED TO, READ BACK BY AND VERIFIED WITH: SCOTT,B. RN AT 1401 08/30/21 MULLINS,T (NOTE) The GeneXpert MRSA Assay (FDA approved for NASAL specimens only), is one component of a comprehensive MRSA colonization surveillance program. It is not intended to diagnose MRSA infection nor to guide or monitor treatment for MRSA infections. Test performance is not FDA approved in patients less than 39 years old. Performed at [facility], [address], [place], [place] [address]      Steve Eke, NP [facility]  09/01/2021  10:57 AM

## 2021-09-02 LAB — GLUCOSE, CAPILLARY
Glucose-Capillary: 116 mg/dL — ABNORMAL HIGH (ref 70–99)
Glucose-Capillary: 145 mg/dL — ABNORMAL HIGH (ref 70–99)
Glucose-Capillary: 149 mg/dL — ABNORMAL HIGH (ref 70–99)
Glucose-Capillary: 173 mg/dL — ABNORMAL HIGH (ref 70–99)
Glucose-Capillary: 97 mg/dL (ref 70–99)

## 2021-09-02 LAB — CULTURE, BLOOD (ROUTINE X 2): Special Requests: ADEQUATE

## 2021-09-02 LAB — CBC
HCT: 31.8 % — ABNORMAL LOW (ref 39.0–52.0)
Hemoglobin: 10.5 g/dL — ABNORMAL LOW (ref 13.0–17.0)
MCH: 27.1 pg (ref 26.0–34.0)
MCHC: 33 g/dL (ref 30.0–36.0)
MCV: 82.2 fL (ref 80.0–100.0)
Platelets: 37 10*3/uL — ABNORMAL LOW (ref 150–400)
RBC: 3.87 MIL/uL — ABNORMAL LOW (ref 4.22–5.81)
RDW: 16.6 % — ABNORMAL HIGH (ref 11.5–15.5)
WBC: 24 10*3/uL — ABNORMAL HIGH (ref 4.0–10.5)
nRBC: 0 % (ref 0.0–0.2)

## 2021-09-02 LAB — BASIC METABOLIC PANEL
Anion gap: 6 (ref 5–15)
BUN: 26 mg/dL — ABNORMAL HIGH (ref 6–20)
CO2: 26 mmol/L (ref 22–32)
Calcium: 8.4 mg/dL — ABNORMAL LOW (ref 8.9–10.3)
Chloride: 104 mmol/L (ref 98–111)
Creatinine, Ser: 0.74 mg/dL (ref 0.61–1.24)
GFR, Estimated: >60 mL/min (ref >60)
Glucose, Bld: 108 mg/dL — ABNORMAL HIGH (ref 70–99)
Potassium: 3 mmol/L — ABNORMAL LOW (ref 3.5–5.1)
Sodium: 136 mmol/L (ref 135–145)

## 2021-09-02 LAB — PHOSPHORUS: Phosphorus: 2.5 mg/dL (ref 2.5–4.6)

## 2021-09-02 LAB — MAGNESIUM: Magnesium: 2.4 mg/dL (ref 1.7–2.4)

## 2021-09-02 MED ORDER — LACTATED RINGERS IV BOLUS
500.0000 mL | Freq: Once | INTRAVENOUS | Status: AC
  Administered 2021-09-02: 500 mL via INTRAVENOUS

## 2021-09-02 MED ORDER — LACTATED RINGERS IV SOLN: INTRAVENOUS | Status: DC

## 2021-09-02 MED ORDER — POTASSIUM CHLORIDE 10 MEQ/50ML IV SOLN
10.0000 meq | INTRAVENOUS | Status: AC
  Administered 2021-09-02 (×4): 10 meq via INTRAVENOUS
  Filled 2021-09-02 (×4): qty 50

## 2021-09-02 MED ORDER — LACTATED RINGERS IV BOLUS
250.0000 mL | Freq: Once | INTRAVENOUS | Status: AC
  Administered 2021-09-02: 250 mL via INTRAVENOUS

## 2021-09-02 MED ORDER — POTASSIUM CHLORIDE CRYS ER 20 MEQ PO TBCR
20.0000 meq | EXTENDED_RELEASE_TABLET | ORAL | Status: AC
  Administered 2021-09-02 (×2): 20 meq via ORAL
  Filled 2021-09-02 (×2): qty 1

## 2021-09-02 NOTE — Progress Notes (Signed)
NAME:  Steve [name], MRN:  [id number], DOB:  1991/04/18, LOS: 4 ADMISSION DATE:  08/29/2021, CONSULTATION DATE:  08/29/2021 REFERRING MD:  Dr. 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   Initial work up consistent with septic shock - possible UTI, infected ulcer as source & COVID positive (Cycle time indicates old infection).    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 BLE mottled 12/15 Zyprexa increased, remains on levo + vaso. BLE with dark purple areas, non-blanching  12/16 Improved mental status, on levo, improved appearance of LE's  12/17 some blisters right foot  Interim History / Subjective:  Vasopressor need continues to improve Mental status improved  Remains afebrile  Objective   Blood pressure (!) 103/48, pulse 71, temperature 98.1 F (36.7 C), temperature source Oral, resp. rate 13, height 5\' 9"  (1.753 m), weight 66.9 kg, SpO2 96 %.        Intake/Output Summary (Last 24 hours) at 09/02/2021 0901 Last data filed at 09/02/2021 0730 Gross per 24 hour  Intake 1527.96 ml  Output 3800 ml  Net -2272.04 ml   Filed Weights   08/30/21 1700 09/01/21 0500 09/02/21 0500  Weight: 75.8 kg 68.9 kg 66.9 kg    Examination: General: chronically ill appearing   HEENT: Moist oral mucosa Neuro: Alert and oriented CV: S1-S2  appreciated PULM: Clear breath sounds bilaterally GI: soft, bsx4 active  Extremities: Warm/dry, foot drop, muscle wasting  Skin: Nonblanching purple discoloration of feet, ischemic changes-stable, some blisters noted   Resolved Hospital Problem list     Assessment & Plan:   Septic shock secondary to Providencia rettgeri bacteremia -Multiple sources of infection-UTI, osteomyelitis, previous COVID -Recently treated with a course of antibiotics, questionable compliance -Currently on meropenem, daptomycin -Levophed being weaned -Stress dose steroids -On midodrine 3 times daily  Systolic heart failure -Requiring Levophed but weaning off dose this -Follow-up echo to check on left ventricular function  Proteus UTI History of Klebsiella in urine -Antibiotics per ID  Multiple decubitus ulcers Present on admission -Wound care to follow -Pressure relieving measures  Acute hypoxemic respiratory failure -Oxygen if needed -Aspiration precautions  Acute metabolic encephalopathy History of C-spine injury with paraplegia History of schizophrenia, small substance abuse -Continue Zyprexa -Continue bowel regimen  Acute kidney injury Electrolyte derangements -Replete electrolytes -Trend BMP  Anemia Thrombocytopenia  At risk for hypoglycemia -Advance diet as tolerated  Ischemic changes lower extremities -Continue to monitor closely -We will try to wean off pressors   Best Practice (right click and "Reselect all SmartList Selections" daily)  Diet/type: Regular consistency (see orders) DVT prophylaxis: prophylactic heparin  GI prophylaxis: PPI Lines: Central line Foley:  Yes, and it is still needed Code Status:  full code Last date of multidisciplinary goals of care discussion: Wife updated at bedside 12/16 on plan of care.  Dispo: pending weaning of vasopressors, likely to SDU, TRH as of 12/17  The patient is critically ill with multiple organ systems failure  and requires  high complexity decision making for assessment and support, frequent evaluation and titration of therapies, application of advanced monitoring technologies and extensive interpretation of multiple databases. Critical Care Time devoted to patient care services described in this note independent of APP/resident time (if applicable)  is 30 minutes.   Virl Diamond MD [facility] Personal pager: See Amion If unanswered, please page CCM On-call: #[phone]

## 2021-09-02 NOTE — Progress Notes (Signed)
[facility] ADULT ICU REPLACEMENT PROTOCOL   The patient does apply for the [facility] Adult ICU Electrolyte Replacment Protocol based on the criteria listed below:   1.Exclusion criteria: TCTS patients, ECMO patients, and Dialysis patients 2. Is GFR >/= 30 ml/min? Yes.    Patient's GFR today is >60 3. Is SCr </= 2? Yes.   Patient's SCr is 0.74 mg/dL 4. Did SCr increase >/= 0.5 in 24 hours? No. 5.Pt's weight >40kg  Yes.   6. Abnormal electrolyte(s):  K 3.0  7. Electrolytes replaced per protocol 8.  Call MD STAT for K+ </= 2.5, Phos </= 1, or Mag </= 1 Physician:  R. Leota Sauers R Worthy Boschert 09/02/2021 6:08 AM

## 2021-09-03 LAB — CULTURE, BLOOD (ROUTINE X 2): Culture: NO GROWTH

## 2021-09-03 LAB — GLUCOSE, CAPILLARY
Glucose-Capillary: 110 mg/dL — ABNORMAL HIGH (ref 70–99)
Glucose-Capillary: 113 mg/dL — ABNORMAL HIGH (ref 70–99)
Glucose-Capillary: 105 mg/dL — ABNORMAL HIGH (ref 70–99)
Glucose-Capillary: 139 mg/dL — ABNORMAL HIGH (ref 70–99)

## 2021-09-03 LAB — CBC
HCT: 27.6 % — ABNORMAL LOW (ref 39.0–52.0)
Hemoglobin: 8.8 g/dL — ABNORMAL LOW (ref 13.0–17.0)
MCH: 27.6 pg (ref 26.0–34.0)
MCHC: 31.9 g/dL (ref 30.0–36.0)
MCV: 86.5 fL (ref 80.0–100.0)
Platelets: 34 10*3/uL — ABNORMAL LOW (ref 150–400)
RBC: 3.19 MIL/uL — ABNORMAL LOW (ref 4.22–5.81)
RDW: 17.2 % — ABNORMAL HIGH (ref 11.5–15.5)
WBC: 14.1 10*3/uL — ABNORMAL HIGH (ref 4.0–10.5)
nRBC: 0 % (ref 0.0–0.2)

## 2021-09-03 LAB — BASIC METABOLIC PANEL
Anion gap: 8 (ref 5–15)
BUN: 25 mg/dL — ABNORMAL HIGH (ref 6–20)
CO2: 26 mmol/L (ref 22–32)
Calcium: 8.4 mg/dL — ABNORMAL LOW (ref 8.9–10.3)
Chloride: 105 mmol/L (ref 98–111)
Creatinine, Ser: 0.69 mg/dL (ref 0.61–1.24)
GFR, Estimated: >60 mL/min (ref >60)
Glucose, Bld: 98 mg/dL (ref 70–99)
Potassium: 3.8 mmol/L (ref 3.5–5.1)
Sodium: 139 mmol/L (ref 135–145)

## 2021-09-03 LAB — PHOSPHORUS: Phosphorus: 1.8 mg/dL — ABNORMAL LOW (ref 2.5–4.6)

## 2021-09-03 LAB — MAGNESIUM: Magnesium: 2 mg/dL (ref 1.7–2.4)

## 2021-09-03 MED ORDER — PANTOPRAZOLE SODIUM 40 MG PO TBEC
40.0000 mg | DELAYED_RELEASE_TABLET | Freq: Every day | ORAL | Status: DC
  Administered 2021-09-03 – 2021-09-08 (×6): 40 mg via ORAL
  Filled 2021-09-03 (×6): qty 1

## 2021-09-03 NOTE — Progress Notes (Signed)
NAME:  Steve [name], MRN:  [id number], DOB:  Sep 19, 1990, LOS: 5 ADMISSION DATE:  08/29/2021, CONSULTATION DATE:  08/29/2021 REFERRING MD:  Dr. 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   Initial work up consistent with septic shock - possible UTI, infected ulcer as source & COVID positive (Cycle time indicates old infection).    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 BLE mottled 12/15 Zyprexa increased, remains on levo + vaso. BLE with dark purple areas, non-blanching  12/16 Improved mental status, on levo, improved appearance of LE's  12/17 some blisters right foot 1218 foot blisters persist, does not appear worse  Interim History / Subjective:  Off vasopressors  mental status still poor  Objective   Blood pressure 95/75, pulse (!) 58, temperature 97.9 F (36.6 C), temperature source Oral, resp. rate 19, height 5\' 9"  (1.753 m), weight 66.7 kg, SpO2 99 %.        Intake/Output Summary (Last 24 hours) at 09/03/2021 1416 Last data filed at 09/03/2021 09/05/2021 Gross per 24 hour  Intake 1281.25 ml  Output 2150 ml  Net -868.75 ml   Filed Weights   09/01/21 0500 09/02/21 0500 09/03/21 0500  Weight: 68.9 kg 66.9 kg 66.7 kg    Examination: General: Chronically ill-appearing  HEENT: Moist oral mucosa Neuro: Alert, oriented to person CV:  S1-S2 appreciated PULM: Clear breath sounds bilaterally GI: soft, bsx4 active  Extremities: Warm/dry, foot drop, muscle wasting  Skin: Nonblanching purple discoloration of feet, ischemic changes-stable, some blisters noted -unchanged from 09/02/2021  Resolved Hospital Problem list     Assessment & Plan:   Septic shock secondary to Providencia rettgeri bacteremia -Multiple sources of infection-UTI, osteomyelitis, previous COVID -Recently treated with a course of antibiotics, questionable compliance  -On meropenem, daptomycin -Stress dose steroids -On midodrine 3 times daily  Systolic heart failure -Off pressors today -Follow-up echo needed to check on ventricular function  Proteus UTI History of Klebsiella in urine -Antibiotics per ID  Multiple decubitus ulcers Present on admission -Wound care to follow -Pressure relieving measures  Acute metabolic encephalopathy History of C-spine injury with paraplegia History of schizophrenia, polysubstance abuse --Continue Zyprexa -Continue bowel regimen  Acute kidney injury Electrolyte derangements -Replete electrolytes  Anemia Thrombocytopenia  Ischemic changes lower extremities -Monitor closely -Off pressors   Best Practice (right click and "Reselect all SmartList Selections" daily)  Diet/type: Regular consistency (see orders) DVT prophylaxis: prophylactic heparin  GI prophylaxis: PPI Lines: Central line Foley:  Yes, and it is still needed Code Status:  full code Last date of multidisciplinary goals of care discussion: Wife updated at bedside 12/16 on plan of care.  Dispo: pending weaning of vasopressors, likely to SDU, TRH as of 12/17  The patient is critically ill with multiple organ systems failure and requires high complexity decision making for assessment and support, frequent evaluation and titration of therapies, application of advanced monitoring technologies and extensive interpretation of multiple databases.  Critical Care Time  devoted to patient care services described in this note independent of APP/resident time (if applicable)  is 30 minutes.   Virl Diamond MD [facility] Personal pager: See Amion If unanswered, please page CCM On-call: #[phone]

## 2021-09-04 ENCOUNTER — Encounter (HOSPITAL_COMMUNITY): Payer: Self-pay | Admitting: Internal Medicine

## 2021-09-04 DIAGNOSIS — F209 Schizophrenia, unspecified: Secondary | ICD-10-CM

## 2021-09-04 DIAGNOSIS — J9690 Respiratory failure, unspecified, unspecified whether with hypoxia or hypercapnia: Secondary | ICD-10-CM | POA: Diagnosis present

## 2021-09-04 DIAGNOSIS — G9341 Metabolic encephalopathy: Secondary | ICD-10-CM | POA: Diagnosis present

## 2021-09-04 DIAGNOSIS — F1910 Other psychoactive substance abuse, uncomplicated: Secondary | ICD-10-CM

## 2021-09-04 DIAGNOSIS — E872 Acidosis, unspecified: Secondary | ICD-10-CM

## 2021-09-04 DIAGNOSIS — E876 Hypokalemia: Secondary | ICD-10-CM | POA: Diagnosis present

## 2021-09-04 DIAGNOSIS — L8944 Pressure ulcer of contiguous site of back, buttock and hip, stage 4: Secondary | ICD-10-CM

## 2021-09-04 DIAGNOSIS — Z87828 Personal history of other (healed) physical injury and trauma: Secondary | ICD-10-CM

## 2021-09-04 DIAGNOSIS — E440 Moderate protein-calorie malnutrition: Secondary | ICD-10-CM

## 2021-09-04 DIAGNOSIS — R748 Abnormal levels of other serum enzymes: Secondary | ICD-10-CM | POA: Diagnosis present

## 2021-09-04 DIAGNOSIS — N179 Acute kidney failure, unspecified: Secondary | ICD-10-CM | POA: Diagnosis present

## 2021-09-04 DIAGNOSIS — D638 Anemia in other chronic diseases classified elsewhere: Secondary | ICD-10-CM

## 2021-09-04 DIAGNOSIS — N390 Urinary tract infection, site not specified: Secondary | ICD-10-CM | POA: Diagnosis present

## 2021-09-04 DIAGNOSIS — I998 Other disorder of circulatory system: Secondary | ICD-10-CM | POA: Clinically undetermined

## 2021-09-04 DIAGNOSIS — D689 Coagulation defect, unspecified: Secondary | ICD-10-CM | POA: Diagnosis present

## 2021-09-04 DIAGNOSIS — F4312 Post-traumatic stress disorder, chronic: Secondary | ICD-10-CM

## 2021-09-04 DIAGNOSIS — R6521 Severe sepsis with septic shock: Secondary | ICD-10-CM | POA: Diagnosis present

## 2021-09-04 DIAGNOSIS — G8220 Paraplegia, unspecified: Secondary | ICD-10-CM

## 2021-09-04 DIAGNOSIS — A4150 Gram-negative sepsis, unspecified: Principal | ICD-10-CM

## 2021-09-04 DIAGNOSIS — D696 Thrombocytopenia, unspecified: Secondary | ICD-10-CM | POA: Diagnosis present

## 2021-09-04 DIAGNOSIS — U071 COVID-19: Secondary | ICD-10-CM | POA: Diagnosis present

## 2021-09-04 DIAGNOSIS — Z8659 Personal history of other mental and behavioral disorders: Secondary | ICD-10-CM | POA: Diagnosis present

## 2021-09-04 LAB — IRON AND TIBC
Iron: 71 ug/dL (ref 45–182)
Saturation Ratios: 41 % — ABNORMAL HIGH (ref 17.9–39.5)
TIBC: 171 ug/dL — ABNORMAL LOW (ref 250–450)
UIBC: 100 ug/dL

## 2021-09-04 LAB — CBC WITH DIFFERENTIAL/PLATELET
Abs Immature Granulocytes: 0.17 10*3/uL — ABNORMAL HIGH (ref 0.00–0.07)
Basophils Absolute: 0 10*3/uL (ref 0.0–0.1)
Basophils Relative: 0 %
Eosinophils Absolute: 0 10*3/uL (ref 0.0–0.5)
Eosinophils Relative: 0 %
HCT: 29.1 % — ABNORMAL LOW (ref 39.0–52.0)
Hemoglobin: 9.1 g/dL — ABNORMAL LOW (ref 13.0–17.0)
Immature Granulocytes: 2 %
Lymphocytes Relative: 13 %
Lymphs Abs: 1.3 10*3/uL (ref 0.7–4.0)
MCH: 27.2 pg (ref 26.0–34.0)
MCHC: 31.3 g/dL (ref 30.0–36.0)
MCV: 87.1 fL (ref 80.0–100.0)
Monocytes Absolute: 0.5 10*3/uL (ref 0.1–1.0)
Monocytes Relative: 5 %
Neutro Abs: 7.8 10*3/uL — ABNORMAL HIGH (ref 1.7–7.7)
Neutrophils Relative %: 80 %
Platelets: 59 10*3/uL — ABNORMAL LOW (ref 150–400)
RBC: 3.34 MIL/uL — ABNORMAL LOW (ref 4.22–5.81)
RDW: 16.7 % — ABNORMAL HIGH (ref 11.5–15.5)
WBC: 9.7 10*3/uL (ref 4.0–10.5)
nRBC: 0 % (ref 0.0–0.2)

## 2021-09-04 LAB — COMPREHENSIVE METABOLIC PANEL
ALT: 41 U/L (ref 0–44)
AST: 28 U/L (ref 15–41)
Albumin: 2.1 g/dL — ABNORMAL LOW (ref 3.5–5.0)
Alkaline Phosphatase: 176 U/L — ABNORMAL HIGH (ref 38–126)
Anion gap: 4 — ABNORMAL LOW (ref 5–15)
BUN: 26 mg/dL — ABNORMAL HIGH (ref 6–20)
CO2: 27 mmol/L (ref 22–32)
Calcium: 8 mg/dL — ABNORMAL LOW (ref 8.9–10.3)
Chloride: 105 mmol/L (ref 98–111)
Creatinine, Ser: 0.52 mg/dL — ABNORMAL LOW (ref 0.61–1.24)
GFR, Estimated: >60 mL/min (ref >60)
Glucose, Bld: 145 mg/dL — ABNORMAL HIGH (ref 70–99)
Potassium: 3.7 mmol/L (ref 3.5–5.1)
Sodium: 136 mmol/L (ref 135–145)
Total Bilirubin: 0.7 mg/dL (ref 0.3–1.2)
Total Protein: 5.7 g/dL — ABNORMAL LOW (ref 6.5–8.1)

## 2021-09-04 LAB — FERRITIN: Ferritin: 612 ng/mL — ABNORMAL HIGH (ref 24–336)

## 2021-09-04 LAB — TYPE AND SCREEN
ABO/RH(D): A POS
Antibody Screen: NEGATIVE

## 2021-09-04 LAB — GLUCOSE, CAPILLARY
Glucose-Capillary: 118 mg/dL — ABNORMAL HIGH (ref 70–99)
Glucose-Capillary: 120 mg/dL — ABNORMAL HIGH (ref 70–99)
Glucose-Capillary: 131 mg/dL — ABNORMAL HIGH (ref 70–99)
Glucose-Capillary: 174 mg/dL — ABNORMAL HIGH (ref 70–99)

## 2021-09-04 LAB — APTT
aPTT: 34 seconds (ref 24–36)
aPTT: 49 seconds — ABNORMAL HIGH (ref 24–36)
aPTT: 62 seconds — ABNORMAL HIGH (ref 24–36)

## 2021-09-04 LAB — PROTIME-INR
INR: 0.9 (ref 0.8–1.2)
Prothrombin Time: 12.6 seconds (ref 11.4–15.2)

## 2021-09-04 LAB — FOLATE: Folate: 5.3 ng/mL — ABNORMAL LOW (ref >5.9)

## 2021-09-04 LAB — TECHNOLOGIST SMEAR REVIEW

## 2021-09-04 LAB — PHOSPHORUS: Phosphorus: 2.1 mg/dL — ABNORMAL LOW (ref 2.5–4.6)

## 2021-09-04 LAB — MAGNESIUM: Magnesium: 1.6 mg/dL — ABNORMAL LOW (ref 1.7–2.4)

## 2021-09-04 LAB — VITAMIN B12: Vitamin B-12: 2094 pg/mL — ABNORMAL HIGH (ref 180–914)

## 2021-09-04 MED ORDER — SODIUM CHLORIDE 0.9% FLUSH
10.0000 mL | Freq: Two times a day (BID) | INTRAVENOUS | Status: DC
  Administered 2021-09-04 (×2): 10 mL
  Administered 2021-09-05: 23:00:00 30 mL
  Administered 2021-09-05 – 2021-09-06 (×2): 10 mL
  Administered 2021-09-06: 11:00:00 30 mL
  Administered 2021-09-07 – 2021-09-08 (×3): 10 mL

## 2021-09-04 MED ORDER — ONDANSETRON HCL 4 MG PO TABS: 4.0000 mg | ORAL_TABLET | Freq: Four times a day (QID) | ORAL | Status: DC | PRN

## 2021-09-04 MED ORDER — MAGNESIUM SULFATE 2 GM/50ML IV SOLN
2.0000 g | Freq: Once | INTRAVENOUS | Status: AC
  Administered 2021-09-04: 11:00:00 2 g via INTRAVENOUS
  Filled 2021-09-04: qty 50

## 2021-09-04 MED ORDER — TRAMADOL HCL 50 MG PO TABS
50.0000 mg | ORAL_TABLET | Freq: Four times a day (QID) | ORAL | Status: DC | PRN
  Administered 2021-09-04 – 2021-09-05 (×2): 50 mg via ORAL
  Filled 2021-09-04 (×4): qty 1

## 2021-09-04 MED ORDER — SODIUM CHLORIDE 0.9% FLUSH: 10.0000 mL | INTRAVENOUS | Status: DC | PRN

## 2021-09-04 MED ORDER — ACETAMINOPHEN 650 MG RE SUPP: 650.0000 mg | Freq: Four times a day (QID) | RECTAL | Status: DC | PRN

## 2021-09-04 MED ORDER — FOLIC ACID 1 MG PO TABS
1.0000 mg | ORAL_TABLET | Freq: Every day | ORAL | Status: DC
  Administered 2021-09-04 – 2021-09-08 (×5): 1 mg via ORAL
  Filled 2021-09-04 (×5): qty 1

## 2021-09-04 MED ORDER — ACETAMINOPHEN 325 MG PO TABS
650.0000 mg | ORAL_TABLET | Freq: Four times a day (QID) | ORAL | Status: DC | PRN
  Administered 2021-09-06 – 2021-09-08 (×5): 650 mg via ORAL
  Filled 2021-09-04 (×5): qty 2

## 2021-09-04 MED ORDER — POTASSIUM PHOSPHATES 15 MMOLE/5ML IV SOLN
15.0000 mmol | Freq: Once | INTRAVENOUS | Status: AC
  Administered 2021-09-04: 11:00:00 15 mmol via INTRAVENOUS
  Filled 2021-09-04: qty 5

## 2021-09-04 MED ORDER — SENNOSIDES-DOCUSATE SODIUM 8.6-50 MG PO TABS: 1.0000 | ORAL_TABLET | Freq: Every evening | ORAL | Status: DC | PRN

## 2021-09-04 MED ORDER — ONDANSETRON HCL 4 MG/2ML IJ SOLN: 4.0000 mg | Freq: Four times a day (QID) | INTRAMUSCULAR | Status: DC | PRN

## 2021-09-04 MED ORDER — ARGATROBAN 50 MG/50ML IV SOLN
0.8000 ug/kg/min | INTRAVENOUS | Status: DC
Start: 2021-09-04 — End: 2021-09-05
  Administered 2021-09-04: 11:00:00 0.5 ug/kg/min via INTRAVENOUS
  Administered 2021-09-05: 04:00:00 0.7 ug/kg/min via INTRAVENOUS
  Filled 2021-09-04 (×2): qty 50

## 2021-09-04 NOTE — Progress Notes (Signed)
Subjective: No new complaints   Antibiotics:  Anti-infectives (From admission, onward)    Start     Dose/Rate Route Frequency Ordered Stop   09/02/21 2000  Oritavancin Diphosphate (ORBACTIV) 1,200 mg in dextrose 5 % IVPB        1,200 mg 333.3 mL/hr over 180 Minutes Intravenous Once 09/01/21 0949 09/02/21 2355   08/31/21 2000  DAPTOmycin (CUBICIN) 600 mg in sodium chloride 0.9 % IVPB        8 mg/kg  75.8 kg 124 mL/hr over 30 Minutes Intravenous Daily 08/31/21 0757 09/02/21 0158   08/31/21 1400  meropenem (MERREM) 1 g in sodium chloride 0.9 % 100 mL IVPB        1 g 200 mL/hr over 30 Minutes Intravenous Every 8 hours 08/31/21 0718 09/08/21 2359   08/30/21 2000  DAPTOmycin (CUBICIN) 500 mg in sodium chloride 0.9 % IVPB  Status:  Discontinued        8 mg/kg  61.2 kg 120 mL/hr over 30 Minutes Intravenous Every 48 hours 08/30/21 0756 08/31/21 0723   08/30/21 1200  vancomycin (VANCOCIN) IVPB 1000 mg/200 mL premix  Status:  Discontinued        1,000 mg 200 mL/hr over 60 Minutes Intravenous Every 24 hours 08/29/21 1438 08/30/21 0727   08/30/21 0600  meropenem (MERREM) 1 g in sodium chloride 0.9 % 100 mL IVPB  Status:  Discontinued        1 g 200 mL/hr over 30 Minutes Intravenous Every 12 hours 08/30/21 0458 08/31/21 0718   08/29/21 2300  ceFEPIme (MAXIPIME) 2 g in sodium chloride 0.9 % 100 mL IVPB  Status:  Discontinued        2 g 200 mL/hr over 30 Minutes Intravenous Every 12 hours 08/29/21 1438 08/30/21 0458   08/29/21 1100  ceFEPIme (MAXIPIME) 2 g in sodium chloride 0.9 % 100 mL IVPB        2 g 200 mL/hr over 30 Minutes Intravenous  Once 08/29/21 1057 08/29/21 1217   08/29/21 1100  vancomycin (VANCOREADY) IVPB 1500 mg/300 mL        1,500 mg 150 mL/hr over 120 Minutes Intravenous  Once 08/29/21 1057 08/29/21 1531   08/29/21 1015  metroNIDAZOLE (FLAGYL) IVPB 500 mg        500 mg 100 mL/hr over 60 Minutes Intravenous  Once 08/29/21 1012 08/29/21 1136        Medications: Scheduled Meds:  vitamin C  500 mg Oral BID   Chlorhexidine Gluconate Cloth  6 each Topical Daily   docusate sodium  100 mg Oral BID   feeding supplement  237 mL Oral TID BM   folic acid  1 mg Oral Daily   hydrocortisone sod succinate (SOLU-CORTEF) inj  100 mg Intravenous Q8H   mouth rinse  15 mL Mouth Rinse BID   midodrine  10 mg Oral TID WC   mupirocin ointment  1 application Nasal BID   OLANZapine zydis  10 mg Oral QHS   pantoprazole  40 mg Oral Daily   polyethylene glycol  17 g Oral Daily   sodium chloride flush  10-40 mL Intracatheter Q12H   zinc sulfate  220 mg Oral Daily   Continuous Infusions:  sodium chloride 250 mL (08/31/21 1422)   argatroban 0.5 mcg/kg/min (09/04/21 1415)   lactated ringers 50 mL/hr at 09/04/21 1415   meropenem (MERREM) IV Stopped (09/04/21 1412)   potassium PHOSPHATE IVPB (in mmol) 43 mL/hr at 09/04/21 1415  PRN Meds:.alum & mag hydroxide-simeth, ondansetron (ZOFRAN) IV, sodium chloride flush    Objective: Weight change: 0.2 kg  Intake/Output Summary (Last 24 hours) at 09/04/2021 1451 Last data filed at 09/04/2021 1415 Gross per 24 hour  Intake 1342.88 ml  Output 1275 ml  Net 67.88 ml   Blood pressure (!) 98/52, pulse 61, temperature (!) 97.5 F (36.4 C), temperature source Oral, resp. rate 18, height 5\' 9"  (1.753 m), weight 66.9 kg, SpO2 96 %. Temp:  [95.8 F (35.4 C)-98.3 F (36.8 C)] 97.5 F (36.4 C) (12/19 1200) Pulse Rate:  [42-62] 61 (12/19 1400) Resp:  [0-21] 18 (12/19 1400) BP: (93-138)/(50-99) 98/52 (12/19 1400) SpO2:  [95 %-100 %] 96 % (12/19 1400) Weight:  [66.9 kg] 66.9 kg (12/19 0500)  Physical Exam: Physical Exam Constitutional:      Appearance: He is ill-appearing.  HENT:     Head: Atraumatic.  Eyes:     General:        Right eye: No discharge.        Left eye: No discharge.  Cardiovascular:     Rate and Rhythm: Tachycardia present.  Pulmonary:     Effort: Pulmonary effort is normal. No  respiratory distress.     Breath sounds: No wheezing.  Abdominal:     General: Abdomen is flat. There is no distension.  Neurological:     Mental Status: He is alert.  Psychiatric:        Mood and Affect: Mood is depressed.        Speech: Speech is delayed.        Behavior: Behavior is cooperative.        Cognition and Memory: Memory is impaired. He exhibits impaired recent memory.    LE 09/04/2021:     CBC:    BMET Recent Labs    09/03/21 0409 09/04/21 0817  NA 139 136  K 3.8 3.7  CL 105 105  CO2 26 27  GLUCOSE 98 145*  BUN 25* 26*  CREATININE 0.69 0.52*  CALCIUM 8.4* 8.0*     Liver Panel  Recent Labs    09/04/21 0817  PROT 5.7*  ALBUMIN 2.1*  AST 28  ALT 41  ALKPHOS 176*  BILITOT 0.7       Sedimentation Rate No results for input(s): ESRSEDRATE in the last 72 hours. C-Reactive Protein No results for input(s): CRP in the last 72 hours.  Micro Results: Recent Results (from the past 720 hour(s))  Blood Culture (routine x 2)     Status: None   Collection Time: 08/29/21 10:16 AM   Specimen: BLOOD  Result Value Ref Range Status   Specimen Description BLOOD BLOOD RIGHT HAND  Final   Special Requests   Final    BOTTLES DRAWN AEROBIC AND ANAEROBIC Blood Culture results may not be optimal due to an inadequate volume of blood received in culture bottles   Culture   Final    NO GROWTH 5 DAYS Performed at [facility], [place] [address], [place], [place] [address]    Report Status 09/03/2021 FINAL  Final  Blood Culture (routine x 2)     Status: Abnormal   Collection Time: 08/29/21 10:20 AM   Specimen: BLOOD  Result Value Ref Range Status   Specimen Description BLOOD LEFT ANTECUBITAL  Final   Special Requests   Final    BOTTLES DRAWN AEROBIC AND ANAEROBIC Blood Culture adequate volume   Culture  Setup Time   Final  GRAM NEGATIVE RODS IN BOTH AEROBIC AND ANAEROBIC BOTTLES CRITICAL RESULT CALLED TO, READ BACK BY AND VERIFIED WITH: M  LILLISTON,PHARMD@0428  08/30/21 [place] Performed at [facility], [place] [address], [place], [place] [address]    Culture PROVIDENCIA RETTGERI (A)  Final   Report Status 09/02/2021 FINAL  Final   Organism ID, Bacteria PROVIDENCIA RETTGERI  Final      Susceptibility   Providencia rettgeri - MIC*    AMPICILLIN >=32 RESISTANT Resistant     CEFAZOLIN >=64 RESISTANT Resistant     CEFEPIME 16 RESISTANT Resistant     CEFTAZIDIME >=64 RESISTANT Resistant     CIPROFLOXACIN <=0.25 SENSITIVE Sensitive     GENTAMICIN >=16 RESISTANT Resistant     IMIPENEM 0.5 SENSITIVE Sensitive     TRIMETH/SULFA >=320 RESISTANT Resistant     AMPICILLIN/SULBACTAM >=32 RESISTANT Resistant     PIP/TAZO <=4 SENSITIVE Sensitive     * PROVIDENCIA RETTGERI  Blood Culture ID Panel (Reflexed)     Status: Abnormal   Collection Time: 08/29/21 10:20 AM  Result Value Ref Range Status   Enterococcus faecalis NOT DETECTED NOT DETECTED Final   Enterococcus Faecium NOT DETECTED NOT DETECTED Final   Listeria monocytogenes NOT DETECTED NOT DETECTED Final   Staphylococcus species NOT DETECTED NOT DETECTED Final   Staphylococcus aureus (BCID) NOT DETECTED NOT DETECTED Final   Staphylococcus epidermidis NOT DETECTED NOT DETECTED Final   Staphylococcus lugdunensis NOT DETECTED NOT DETECTED Final   Streptococcus species NOT DETECTED NOT DETECTED Final   Streptococcus agalactiae NOT DETECTED NOT DETECTED Final   Streptococcus pneumoniae NOT DETECTED NOT DETECTED Final   Streptococcus pyogenes NOT DETECTED NOT DETECTED Final   A.calcoaceticus-baumannii NOT DETECTED NOT DETECTED Final   Bacteroides fragilis NOT DETECTED NOT DETECTED Final   Enterobacterales DETECTED (A) NOT DETECTED Final    Comment: Enterobacterales represent a large order of gram negative bacteria, not a single organism. Refer to culture for further identification. CRITICAL RESULT CALLED TO, READ BACK BY AND VERIFIED WITH: M LILLISTON,PHARMD@0430  08/30/21 [place]     Enterobacter cloacae complex NOT DETECTED NOT DETECTED Final   Escherichia coli NOT DETECTED NOT DETECTED Final   Klebsiella aerogenes NOT DETECTED NOT DETECTED Final   Klebsiella oxytoca NOT DETECTED NOT DETECTED Final   Klebsiella pneumoniae NOT DETECTED NOT DETECTED Final   Proteus species NOT DETECTED NOT DETECTED Final   Salmonella species NOT DETECTED NOT DETECTED Final   Serratia marcescens NOT DETECTED NOT DETECTED Final   Haemophilus influenzae NOT DETECTED NOT DETECTED Final   Neisseria meningitidis NOT DETECTED NOT DETECTED Final   Pseudomonas aeruginosa NOT DETECTED NOT DETECTED Final   Stenotrophomonas maltophilia NOT DETECTED NOT DETECTED Final   Candida albicans NOT DETECTED NOT DETECTED Final   Candida auris NOT DETECTED NOT DETECTED Final   Candida glabrata NOT DETECTED NOT DETECTED Final   Candida krusei NOT DETECTED NOT DETECTED Final   Candida parapsilosis NOT DETECTED NOT DETECTED Final   Candida tropicalis NOT DETECTED NOT DETECTED Final   Cryptococcus neoformans/gattii NOT DETECTED NOT DETECTED Final   CTX-M ESBL DETECTED (A) NOT DETECTED Final    Comment: CRITICAL RESULT CALLED TO, READ BACK BY AND VERIFIED WITH: M LILLISTON,PHARMD@0430  08/30/21 [place] (NOTE) Extended spectrum beta-lactamase detected. Recommend a carbapenem as initial therapy.      Carbapenem resistance IMP NOT DETECTED NOT DETECTED Final   Carbapenem resistance KPC NOT DETECTED NOT DETECTED Final   Carbapenem resistance NDM NOT DETECTED NOT DETECTED Final   Carbapenem resist OXA 48 LIKE  NOT DETECTED NOT DETECTED Final   Carbapenem resistance VIM NOT DETECTED NOT DETECTED Final    Comment: Performed at [facility], [address], [place], [place] [address]  Resp Panel by RT-PCR (Flu A&B, Covid) Nasopharyngeal Swab     Status: Abnormal   Collection Time: 08/29/21 11:07 AM   Specimen: Nasopharyngeal Swab; Nasopharyngeal(NP) swabs in vial transport medium  Result Value Ref Range Status    SARS Coronavirus 2 by RT PCR POSITIVE (A) NEGATIVE Final    Comment: (NOTE) SARS-CoV-2 target nucleic acids are DETECTED.  The SARS-CoV-2 RNA is generally detectable in upper respiratory specimens during the acute phase of infection. Positive results are indicative of the presence of the identified virus, but do not rule out bacterial infection or co-infection with other pathogens not detected by the test. Clinical correlation with patient history and other diagnostic information is necessary to determine patient infection status. The expected result is Negative.  Fact Sheet for Patients: BloggerCourse.com  Fact Sheet for Healthcare Providers: SeriousBroker.it  This test is not yet approved or cleared by the [place] FDA and  has been authorized for detection and/or diagnosis of SARS-CoV-2 by FDA under an Emergency Use Authorization (EUA).  This EUA will remain in effect (meaning this test can be used) for the duration of  the COVID-19 declaration under Section 564(b)(1) of the A ct, 21 U.S.C. section 360bbb-3(b)(1), unless the authorization is terminated or revoked sooner.     Influenza A by PCR NEGATIVE NEGATIVE Final   Influenza B by PCR NEGATIVE NEGATIVE Final    Comment: (NOTE) The Xpert Xpress SARS-CoV-2/FLU/RSV plus assay is intended as an aid in the diagnosis of influenza from Nasopharyngeal swab specimens and should not be used as a sole basis for treatment. Nasal washings and aspirates are unacceptable for Xpert Xpress SARS-CoV-2/FLU/RSV testing.  Fact Sheet for Patients: BloggerCourse.com  Fact Sheet for Healthcare Providers: SeriousBroker.it  This test is not yet approved or cleared by the [place] FDA and has been authorized for detection and/or diagnosis of SARS-CoV-2 by FDA under an Emergency Use Authorization (EUA). This EUA will remain in effect  (meaning this test can be used) for the duration of the COVID-19 declaration under Section 564(b)(1) of the Act, 21 U.S.C. section 360bbb-3(b)(1), unless the authorization is terminated or revoked.  Performed at [facility], [address] [place], [place] [address]   Urine Culture     Status: Abnormal   Collection Time: 08/29/21 11:07 AM   Specimen: In/Out Cath Urine  Result Value Ref Range Status   Specimen Description IN/OUT CATH URINE  Final   Special Requests NONE  Final   Culture (A)  Final    30,000 COLONIES/mL PROTEUS MIRABILIS WITHIN MIXED CULTURE Performed at [facility], [address] [place], [place] [address]    Report Status 09/01/2021 FINAL  Final   Organism ID, Bacteria PROTEUS MIRABILIS (A)  Final      Susceptibility   Proteus mirabilis - MIC*    AMPICILLIN <=2 SENSITIVE Sensitive     CEFAZOLIN <=4 SENSITIVE Sensitive     CEFEPIME <=0.12 SENSITIVE Sensitive     CEFTRIAXONE <=0.25 SENSITIVE Sensitive     CIPROFLOXACIN <=0.25 SENSITIVE Sensitive     GENTAMICIN <=1 SENSITIVE Sensitive     IMIPENEM 2 SENSITIVE Sensitive     NITROFURANTOIN RESISTANT Resistant     TRIMETH/SULFA <=20 SENSITIVE Sensitive     AMPICILLIN/SULBACTAM <=2 SENSITIVE Sensitive     PIP/TAZO <=4  SENSITIVE Sensitive     * 30,000 COLONIES/mL PROTEUS MIRABILIS  MRSA Next Gen by PCR, Nasal     Status: Abnormal   Collection Time: 08/30/21 11:58 AM   Specimen: Nasal Mucosa; Nasal Swab  Result Value Ref Range Status   MRSA by PCR Next Gen DETECTED (A) NOT DETECTED Final    Comment: RESULT CALLED TO, READ BACK BY AND VERIFIED WITH: SCOTT,B. RN AT 1401 08/30/21 MULLINS,T (NOTE) The GeneXpert MRSA Assay (FDA approved for NASAL specimens only), is one component of a comprehensive MRSA colonization surveillance program. It is not intended to diagnose MRSA infection nor to guide or monitor treatment for MRSA infections. Test performance is not FDA approved in patients less than 23  years old. Performed at [facility], [place] [address], [place],  [address]     Studies/Results: No results found.    Assessment/Plan:  INTERVAL HISTORY: Patient has been evaluated for and started on treatment for possible heparin-induced thrombocytopenia   Principal Problem:   Sepsis due to Gram-negative organism with septic shock ([place]) Active Problems:   Chronic post-traumatic stress disorder (PTSD)   Paraplegia (HCC)   Substance abuse ([place])   Decubitus ulcers   Anemia in other chronic diseases classified elsewhere   Sacral osteomyelitis (HCC)   Gram-negative bacteremia   Malnutrition of moderate degree   Respiratory failure requiring intubation (HCC)   Thrombocytopenia (HCC)   Schizophrenia, chronic condition with acute exacerbation (HCC)   Elevated CK   Acute lower UTI   Acute metabolic encephalopathy   AKI (acute kidney injury) ([place])   Lactic acidosis   Hypokalemia   Hypophosphatemia   Hypomagnesemia   Hyperbilirubinemia   Coagulopathy (HCC)   COVID-19 virus infection   History of cervical spine trauma    Steve [name] is a 30 y.o. 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   Certainly if the patient is willing and able to keep an appointment with [place] he is welcome to make an appointment.  I suspect he will leave [place] when he improves.  Please call with further questions I will sign off  for now.      LOS: 6 days   Alcide Evener 09/04/2021, 2:51 PM

## 2021-09-04 NOTE — Assessment & Plan Note (Addendum)
Confused and agitated on admission.   Mentation improved with improvement in sepsis physiology. NO longer agitated but very confused to time, place and situation now-

## 2021-09-04 NOTE — Assessment & Plan Note (Addendum)
HIT ruled out.  Platelet nadir was 30 and has subsequently improved No schistocytes on the smear.  DIC panel negative as well.   Appreciate hematology consultation

## 2021-09-04 NOTE — Assessment & Plan Note (Addendum)
-   Blood culture positive for Providence Rettgeri on 08/29/2021 -Given imipenem for total of 7 days as recommended by ID-stop date 09/06/2021 - Stress dose steroids being weaned - Also on midodrine 5 mg 3 times daily which I will continue for now

## 2021-09-04 NOTE — Assessment & Plan Note (Addendum)
-   Baseline serum creatinine 0.6.   - On admission serum creatinine 1.95 worsened to the point of 3.17.  -Has subsequently improving to normal.

## 2021-09-04 NOTE — Assessment & Plan Note (Signed)
ID on board. Recommend to complete total 7-day of therapy and then stop Carbapenem. SP vancomycin on admission for MRSA bacteremia, daptomycin through 12/17 and then given 1200 mg of oritavancin.

## 2021-09-04 NOTE — Progress Notes (Signed)
[facility] Progress Note  Patient: Steve [name]    OFB:[id number]  DOA: 08/29/2021    Date of Service: the patient was seen and examined on 09/04/2021  Brief hospital course: 12/13 Admitted with sepsis  12/14 Self extubated, remains on pressors. BLE mottled 12/15 Zyprexa increased, remains on 89mcg levo + vaso. BLE with dark purple areas, non-blanching  12/16 Improved mental status, on 84mcg levo, improved appearance of LE's 12/18 off of pressors 12/19 transferred to [facility] service, concern for HIT, hematology consulted.  Started on argatroban.  Psychiatry consulted for schizophrenia.  Assessment and Plan: * Sepsis due to Gram-negative organism with septic shock ([place]) Presents with UTI, osteomyelitis as well as recent COVID infection. Met SIRS criteria on admission with hypotension, tachycardia, thrombocytopenia. Required intubation for altered mental status change.  Required pressors as well. On IV Solu-Cortef for refractory shock since 12/14 IV meropenem since 12/13 IV daptomycin 12/14- 12/16, stopped due to worsening CK. IV oritavancin 12/17 IV Levophed till 12/17, IV vasopressin till 12/16.  IV phenylephrine on 12/13. Blood culture 12/13 positive for Providencia rettgeri, sensitivity to Cipro, imipenem, Zosyn.  Urine culture 12/13 positive for Proteus.  MRSA PCR positive. Currently sepsis physiology improving. Still on midodrine.  Still on steroids.  Still on IV antibiotics.  Still has a central line.   Gram-negative bacteremia ID on board. Recommend to complete total 7-day of therapy and then stop Carbapenem. SP vancomycin on admission for MRSA bacteremia, daptomycin through 12/17 and then given 1200 mg of oritavancin.  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  Coagulopathy (H[facility]) INR elevated.  Currently improving.  Acute thrombocytopenia.  Thrombocytopenia ([place]) Acute.  Platelet count went as low as 30.  Now improving. No schistocytes on the  smear.  DIC panel negative as well.  Recently hospitalized in November.  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  HIT panel and SRS sent.  Patient does have necrosis appearing in lower extremity which can be seen rarely in HIT and therefore we will initiate therapeutic argatroban.  Monitor per pharmacy.  Appreciate hematology consultation.  Anemia in other chronic diseases classified elsewhere Most likely anemia secondary to chronic disease from osteomyelitis. H&H relatively stable. No evidence of schistocytes.  Ischemic changes of bilateral lower extremity Progressively worsening since admission. Etiology unclear, differential include purpura fulminans in the setting of septic shock, ischemic changes in the setting of need for pressors, HIT including other conditions. Monitor for now. Due to paraplegia currently no pain. May need to consider consultation with vascular surgery and plastic surgery if continues to progress further.  Respiratory failure requiring intubation ([place]) Intubated on admission due to progressive altered mental status for a protection. Extubated next day.  Acute metabolic encephalopathy Confused and agitated on admission.  Mentation improved with improvement in sepsis physiology. Appears to be multifactorial in the setting of sepsis, schizophrenia, medication side effect, substance abuse.  Schizophrenia, chronic condition with acute exacerbation [facility]) Psychiatry consulted.  Diagnosis of paranoid schizophrenia but not on any medication. Psychiatry recommends to continue Zyprexa initiated by PCCM and refer outpatient psychiatry for medication management.    Substance abuse ([place]) UDS positive for meth, benzodiazepine and THC on admission. Prior history of cocaine use as well. Placing the patient at high risk for poor outcome. Patient has tendency of leaving AMA once he feels medically  improved.  Hypomagnesemia Replaced.  Hypophosphatemia Replaced.  Hypokalemia Potassium level low.  Replaced.  AKI (acute kidney injury) ([place]) Baseline serum creatinine 0.6.  On admission serum creatinine 1.95 worsened  to the point of 3.17.  Now improving to normal.  Monitor.  COVID-19 virus infection Recent infection. No indication for airborne precautions with CT value of 38. Placing the patient at high risk for poor outcome.  Hyperbilirubinemia Elevated on admission.  Improving.  Lactic acidosis Elevated on admission.  In the setting of severe septic shock.  Improving.  Elevated CK Elevated in the setting of daptomycin use. Currently medication discontinued. Monitor.    Decubitus ulcers Stage IV left ischial tuberosity, sacrum mid deep tissue injury present on admission. Continue dressing changes per wound care.     Pressure Injury 08/29/21 Ischial tuberosity Left Stage 4 - Full thickness tissue loss with exposed bone, tendon or muscle. 4A3V5 (Active)  08/29/21 0100  Location: Ischial tuberosity  Location Orientation: Left  Staging: Stage 4 - Full thickness tissue loss with exposed bone, tendon or muscle.  Wound Description (Comments): [id number]  Present on Admission: Yes     Pressure Injury 09/01/21 Sacrum Mid Deep Tissue Pressure Injury - Purple or maroon localized area of discolored intact skin or blood-filled blister due to damage of underlying soft tissue from pressure and/or shear. small dark area in the middle of the s (Active)  09/01/21 0900  Location: Sacrum  Location Orientation: Mid  Staging: Deep Tissue Pressure Injury - Purple or maroon localized area of discolored intact skin or blood-filled blister due to damage of underlying soft tissue from pressure and/or shear.  Wound Description (Comments): small dark area in the middle of the sacrum  Present on Admission:      Paraplegia [facility]) Prior history.    Malnutrition of moderate degree Body mass index  is 21.78 kg/m.  Nutrition Problem: Moderate Malnutrition Etiology: social / environmental circumstances Nutrition Interventions: Interventions: Juven, Ensure Enlive (each supplement provides 350kcal and 20 grams of protein)    Subjective: Reports some neck pain.  No nausea no vomiting.  Sleeping okay.  Oral appetite improving.  No chest pain abdominal pain.  Objective: Vital signs were reviewed and unremarkable.  Exam: General: Appear in mild distress, no Rash;, worsening ischemic changes on bilateral lower extremity. Oral Mucosa Clear, moist. no Abnormal Neck Mass Or lumps, Conjunctiva normal  Cardiovascular: S1 and S2 Present, no Murmur, Respiratory: good respiratory effort, Bilateral Air entry present and CTA, no Crackles, no wheezes Abdomen: Bowel Sound present, Soft and no tenderness Extremities: no Pedal edema Neurology: alert and oriented to time, place, and person affect appropriate. no new focal deficit, chronic paraplegia Gait not checked due to patient safety concerns faint basal  Data Reviewed: Improving thrombocytopenia, stable potassium, low magnesium, low folic acid level, stable hemoglobin.  Disposition:  Status is: Inpatient  Remains inpatient appropriate because: Ongoing requirement for IV antibiotics as well as IV anticoagulation.   Family Communication: None at bedside.  DVT Prophylaxis: SCDs Start: 08/29/21 1408  Time spent: The patient is critically ill with multiple organ systems failure and requires high complexity decision making for assessment and support, frequent evaluation and titration of therapies. Critical Care Time devoted to patient care services described in this note is 35 minutes   Author: Berle Mull  09/04/2021 8:54 PM  To reach On-call, see care teams to locate the attending and reach out via [email or web address]. Between 7PM-7AM, please contact night-coverage If you still have difficulty reaching the attending provider, please page the  [facility] (Director on Call) for [facility] on amion for assistance.

## 2021-09-04 NOTE — Consult Note (Addendum)
[place]  Telephone:([phone]) [phone] Fax:(336) [phone]    [place]  Referring MD: Dr. Berle Mull  Reason for Referral: Thrombocytopenia, concern for HIT  HPI: 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  He presented to the emergency department by EMS after being found altered by family.  Upon arrival, he had shallow respirations and work-up was consistent with sepsis.  His temperature was elevated at 104.6, he was tachycardic, and hypotensive.  Lactic acid was elevated at 7.1.  He has been receiving cefepime and also received at least 1 dose of vancomycin.  Blood cultures were positive for Providencia rettgeri.  This admission, he has been receiving subcu heparin since admission on 08/29/2021.  Platelet count on admission was normal at 190,000 and were as low as 34,000 on 12/15.  Today they were up to 59,000.  Heparin has been stopped and he has been switched to argatroban.  HIT panel has been sent and is pending.  The patient had a recent hospital admission 07/26/2021 through 08/04/2021 and left AMA.  This hospital record was reviewed as well as prior medications.  The patient did not receive any heparin or Lovenox during that hospital admission.  He was also hospitalized in September 2022 and did receive Lovenox 40 mg daily from 05/30/2021 through 06/03/2021.  The patient was seen in his hospital room.  No family at the bedside.  Able to answer questions but minimally interactive.  He denies prior history of blood clots.  He denies bleeding.  Reports mild discomfort.  Otherwise, he is not having any fevers, chills, headaches, dizziness, chest pain, shortness of breath, abdominal pain, nausea, vomiting.  Hematology was asked to see the patient to make recommendations regarding his thrombocytopenia.  Past Medical History:   Diagnosis Date   Anxiety    Paranoid schizophrenia ([place])    Paraplegia ([place])    PTSD (post-traumatic stress disorder) Paranoid  :  Past Surgical History:  Procedure Laterality Date   banding procedure for morbid obesity     I & D EXTREMITY Right 02/27/2014   Procedure: IRRIGATION AND DEBRIDEMENT FOREARM AND REPAIR OF 30cm LACERATION;  Surgeon: Johnny Bridge, MD;  Location: [facility];  Service: Orthopedics;  Laterality: Right;  Anesthesia Regional with MAC   TEE WITHOUT CARDIOVERSION N/A 08/01/2021   Procedure: TRANSESOPHAGEAL ECHOCARDIOGRAM (TEE);  Surgeon: Pixie Casino, MD;  Location: [facility] ENDOSCOPY;  Service: Cardiovascular;  Laterality: N/A;  :   CURRENT MEDS: Current Facility-Administered Medications  Medication Dose Route Frequency Provider Last Rate Last Admin   0.9 %  sodium chloride infusion  250 mL Intravenous Continuous Candee Furbish, MD 10 mL/hr at 08/31/21 1422 250 mL at 08/31/21 1422   alum & mag hydroxide-simeth (MAALOX/MYLANTA) 200-200-20 MG/5ML suspension 30 mL  30 mL Oral Q6H PRN Olalere, Adewale A, MD   30 mL at 09/03/21 1937   argatroban 1 mg/mL infusion  0.5 mcg/kg/min Intravenous Continuous Emiliano Dyer, RPH       ascorbic acid (VITAMIN C) tablet 500 mg  500 mg Oral BID Candee Furbish, MD   500 mg at 09/04/21 9892   Chlorhexidine Gluconate Cloth 2 % PADS 6 each  6 each Topical Daily Icard, Bradley L, DO   6 each at 09/03/21 1714   docusate sodium (COLACE) capsule 100 mg  100 mg Oral BID Candee Furbish, MD   100  mg at 08/31/21 2035   feeding supplement (ENSURE ENLIVE / ENSURE PLUS) liquid 237 mL  237 mL Oral TID BM Candee Furbish, MD   237 mL at 09/04/21 0947   hydrocortisone sodium succinate (SOLU-CORTEF) 100 MG injection 100 mg  100 mg Intravenous Q8H Ollis, Brandi L, NP   100 mg at 09/04/21 6759   lactated ringers infusion   Intravenous Continuous Olalere, Adewale A, MD 50 mL/hr at 09/04/21 1037 New Bag at 09/04/21 1037   magnesium sulfate IVPB 2 g 50  mL  2 g Intravenous Once Lavina Hamman, MD       MEDLINE mouth rinse  15 mL Mouth Rinse BID Icard, Bradley L, DO   15 mL at 09/04/21 0947   meropenem (MERREM) 1 g in sodium chloride 0.9 % 100 mL IVPB  1 g Intravenous Q8H [place] Circle, FNP   Stopped at 09/04/21 0545   midodrine (PROAMATINE) tablet 10 mg  10 mg Oral TID WC Ollis, Brandi L, NP   10 mg at 09/04/21 1638   mupirocin ointment (BACTROBAN) 2 % 1 application  1 application Nasal BID Olalere, Adewale A, MD   1 application at 46/65/99 0947   OLANZapine zydis (ZYPREXA) disintegrating tablet 10 mg  10 mg Oral QHS Ollis, Brandi L, NP   10 mg at 09/03/21 2156   ondansetron (ZOFRAN) injection 4 mg  4 mg Intravenous Q6H PRN Candee Furbish, MD   4 mg at 09/03/21 1035   pantoprazole (PROTONIX) EC tablet 40 mg  40 mg Oral Daily Olalere, Adewale A, MD   40 mg at 09/04/21 0947   polyethylene glycol (MIRALAX / GLYCOLAX) packet 17 g  17 g Oral Daily Candee Furbish, MD       potassium PHOSPHATE 15 mmol in dextrose 5 % 250 mL infusion  15 mmol Intravenous Once Lavina Hamman, MD       sodium chloride flush (NS) 0.9 % injection 10-40 mL  10-40 mL Intracatheter Q12H Lavina Hamman, MD   10 mL at 09/04/21 0948   sodium chloride flush (NS) 0.9 % injection 10-40 mL  10-40 mL Intracatheter PRN Lavina Hamman, MD       zinc sulfate capsule 220 mg  220 mg Oral Daily Candee Furbish, MD   220 mg at 09/04/21 3570    Allergies  Allergen Reactions   Caffeine Anaphylaxis   Caffeine Anaphylaxis   Chocolate Anaphylaxis   Chocolate Anaphylaxis   Tuberculin     Other reaction(s): Eruption of skin   Sulfa Antibiotics Rash   Sulfa Antibiotics Rash  :   Family History  Problem Relation Age of Onset   Depression Mother    Bipolar disorder Father   :  Social History   Socioeconomic History   Marital status: Married    Spouse name: Not on file   Number of children: Not on file   Years of education: Not on file   Highest education level: Not on  file  Occupational History   Not on file  Tobacco Use   Smoking status: Some Days    Packs/day: 0.25    Types: Cigarettes   Smokeless tobacco: Never  Vaping Use   Vaping Use: Unknown  Substance and Sexual Activity   Alcohol use: Not Currently   Drug use: Yes    Types: Marijuana, Methamphetamines   Sexual activity: Not Currently  Other Topics Concern   Not on file  Social History Narrative  Not on file   Social Determinants of Health   Financial Resource Strain: Not on file  Food Insecurity: Not on file  Transportation Needs: Not on file  Physical Activity: Not on file  Stress: Not on file  Social Connections: Not on file  Intimate Partner Violence: Not on file  :  REVIEW OF SYSTEMS:  A comprehensive 14 point review of systems was negative except as noted in the HPI.    Exam: Patient Vitals for the past 24 hrs:  BP Temp Temp src Pulse Resp SpO2 Weight  09/04/21 1000 109/65 -- -- (!) 51 13 95 % --  09/04/21 0900 112/64 -- -- (!) 57 10 100 % --  09/04/21 0800 (!) 114/99 (!) 95.8 F (35.4 C) Axillary (!) 54 16 99 % --  09/04/21 0700 (!) 93/50 -- -- (!) 56 13 100 % --  09/04/21 0600 -- -- -- (!) 49 16 99 % --  09/04/21 0500 -- -- -- (!) 44 13 100 % 66.9 kg  09/04/21 0400 -- -- -- (!) 42 14 99 % --  09/04/21 0200 121/78 -- -- (!) 48 (!) 0 97 % --  09/04/21 0100 119/77 -- -- (!) 49 14 97 % --  09/04/21 0000 124/75 (!) 97.4 F (36.3 C) Axillary (!) 55 (!) 21 98 % --  09/03/21 2300 127/79 -- -- (!) 50 11 99 % --  09/03/21 2200 113/65 -- -- (!) 53 16 98 % --  09/03/21 2100 115/73 -- -- (!) 59 15 97 % --  09/03/21 2000 119/71 -- -- (!) 52 18 98 % --  09/03/21 1900 -- 97.9 F (36.6 C) Oral -- -- -- --  09/03/21 1700 127/73 -- -- (!) 51 11 95 % --  09/03/21 1600 138/88 98.3 F (36.8 C) Oral (!) 56 17 96 % --  09/03/21 1500 137/88 -- -- (!) 56 11 95 % --  09/03/21 1400 119/70 -- -- (!) 59 10 95 % --  09/03/21 1300 103/60 -- -- 65 12 100 % --  09/03/21 1202 -- 97.9 F  (36.6 C) Oral -- -- -- --  09/03/21 1200 105/61 -- -- 70 13 99 % --    General: Chronically ill-appearing male, no distress. Eyes:  no scleral icterus.   ENT:  There were no oropharyngeal lesions.    Respiratory: lungs were clear bilaterally without wheezing or crackles.   Cardiovascular:  Regular rate and rhythm, S1/S2, without murmur, rub or gallop.  Trace pedal edema.   GI:  abdomen was soft, flat, nontender, nondistended.   Skin: Nonblanching purple discoloration on the feet with ischemic changes.  Review of records indicate these have been present since at least 12/17. Neuro: Alert, answers questions, follows commands  LABS:  Lab Results  Component Value Date   WBC 9.7 09/04/2021   HGB 9.1 (L) 09/04/2021   HCT 29.1 (L) 09/04/2021   PLT 59 (L) 09/04/2021   GLUCOSE 145 (H) 09/04/2021   ALT 41 09/04/2021   AST 28 09/04/2021   NA 136 09/04/2021   K 3.7 09/04/2021   CL 105 09/04/2021   CREATININE 0.52 (L) 09/04/2021   BUN 26 (H) 09/04/2021   CO2 27 09/04/2021   INR 0.9 09/04/2021   HGBA1C 5.1 02/02/2021    US RENAL  Result Date: 08/30/2021 CLINICAL DATA:  Acute kidney injury EXAM: RENAL / URINARY TRACT ULTRASOUND COMPLETE COMPARISON:  None. FINDINGS: Right Kidney: Renal measurements: 12.2 x 5.7 x 7.1 cm = volume: 259.7 mL.  Echogenicity within normal limits. No mass or hydronephrosis visualized. Left Kidney: Renal measurements: 12.4 x 7.1 x 7.6 cm = volume: 353.3 mL. Echogenicity within normal limits. No mass or hydronephrosis visualized. Bladder: Partially decompressed.  Foley catheter is present. Other: None. IMPRESSION: No hydronephrosis. Electronically Signed   By: Macy Mis M.D.   On: 08/30/2021 13:51   DG Chest Portable 1 View  Result Date: 08/29/2021 CLINICAL DATA:  Central line placement. EXAM: PORTABLE CHEST 1 VIEW COMPARISON:  08/29/2021, earlier same day FINDINGS: 1546 hours. Left IJ central line tip overlies the upper right atrium. Another possible catheter  overlies the left axillary region with the tip just lateral to the left second rib. No pneumothorax or pleural effusion. The cardiopericardial silhouette is within normal limits for size. The visualized bony structures of the thorax show no acute abnormality. Telemetry leads overlie the chest. IMPRESSION: Left IJ central line tip overlies the upper right atrium. No pneumothorax or pleural effusion. Electronically Signed   By: Misty Stanley M.D.   On: 08/29/2021 15:53   DG Chest Port 1 View  Result Date: 08/29/2021 CLINICAL DATA:  30 year old male with sepsis. Low oxygen saturations. EXAM: PORTABLE CHEST 1 VIEW COMPARISON:  Chest x-ray 07/26/2021. FINDINGS: Lung volumes are low. No consolidative airspace disease. No pleural effusions. No pneumothorax. No pulmonary nodule or mass noted. Pulmonary vasculature and the cardiomediastinal silhouette are within normal limits. IMPRESSION: 1. Low lung volumes without radiographic evidence of acute cardiopulmonary disease. Electronically Signed   By: Vinnie Langton M.D.   On: 08/29/2021 10:51   ECHOCARDIOGRAM LIMITED  Result Date: 08/30/2021    ECHOCARDIOGRAM REPORT   Patient Name:   Steve [name] [facility] Date of Exam: 08/30/2021 Medical Rec #:  [id number]       Height:       69.0 in Accession #:    [id number]      Weight:       135.0 lb Date of Birth:  December 24, 1990       BSA:          1.748 m Patient Age:    30 years        BP:           92/57 mmHg Patient Gender: M               HR:           104 bpm. Exam Location:  Inpatient Procedure: Limited Echo, Color Doppler and Cardiac Doppler STAT ECHO                                 MODIFIED REPORT:   This report was modified by Rudean Haskell MD on 08/30/2021 due to MD                                  notification.  Indications:     Endocarditis  History:         Patient has prior history of Echocardiogram examinations, most                  recent 08/01/2021. Risk Factors:Polysubstance abuse.  Sonographer:     Raquel Sarna  Senior RDCS Referring Phys:  [id number] Candee Furbish Diagnosing Phys: Rudean Haskell MD  Sonographer Comments: Scanned upright on artificial respirator. IMPRESSIONS  1. Left ventricular ejection fraction, by estimation, is 25 to 30%. The  left ventricle has severely decreased function. The left ventricle demonstrates global hypokinesis. Left ventricular diastolic function could not be evaluated.  2. Right ventricular systolic function is mildly reduced. The right ventricular size is normal. There is normal pulmonary artery systolic pressure.  3. The mitral valve is grossly normal. No evidence of mitral valve regurgitation.  4. The aortic valve was not well visualized. Aortic valve regurgitation is not visualized. Comparison(s): A prior study was performed on 08/01/21. Prior images reviewed side by side. LVEF is markedly reduced. Reaching out to primary MD. FINDINGS  Left Ventricle: Left ventricular ejection fraction, by estimation, is 25 to 30%. The left ventricle has severely decreased function. The left ventricle demonstrates global hypokinesis. The left ventricular internal cavity size was normal in size. There is no left ventricular hypertrophy. Left ventricular diastolic function could not be evaluated. Right Ventricle: The right ventricular size is normal. No increase in right ventricular wall thickness. Right ventricular systolic function is mildly reduced. There is normal pulmonary artery systolic pressure. The tricuspid regurgitant velocity is 1.85 m/s, and with an assumed right atrial pressure of 8 mmHg, the estimated right ventricular systolic pressure is 98.9 mmHg. Left Atrium: Left atrial size was normal in size. Right Atrium: Right atrial size was normal in size. Pericardium: There is no evidence of pericardial effusion. Mitral Valve: The mitral valve is grossly normal. There is mild thickening of the mitral valve leaflet(s). No evidence of mitral valve regurgitation. Tricuspid Valve: The tricuspid  valve is normal in structure. Tricuspid valve regurgitation is mild. Aortic Valve: The aortic valve was not well visualized. Aortic valve regurgitation is not visualized. Pulmonic Valve: The pulmonic valve was not well visualized. Pulmonic valve regurgitation is not visualized. Aorta: The aortic root is normal in size and structure. IAS/Shunts: The interatrial septum was not assessed.   LV Volumes (MOD) LV vol d, MOD A2C: 99.0 ml LV vol d, MOD A4C: 114.0 ml LV vol s, MOD A2C: 70.3 ml LV vol s, MOD A4C: 66.0 ml LV SV MOD A2C:     28.7 ml LV SV MOD A4C:     114.0 ml LV SV MOD BP:      37.0 ml TRICUSPID VALVE TR Peak grad:   13.7 mmHg TR Vmax:        185.00 cm/s Rudean Haskell MD Electronically signed by Rudean Haskell MD Signature Date/Time: 08/30/2021/9:18:25 AM    Final (Updated)      ASSESSMENT AND PLAN:  1.  Thrombocytopenia 2.  Normocytic anemia 3.  Septic shock secondary to Providencia rettgeri bacteremia 4.  Systolic heart failure 5.  Proteus UTI 6.  Multiple decubitus ulcers 7.  Acute metabolic encephalopathy 8.  Ischemic changes to lower extremities-received pressors recently  -Labs reviewed.  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  However, skin lesions at are present could be related to prior pressors.  HIT panel and SRA have been sent and are pending.  We will follow-up on results.  He has already been started on argatroban and agree with continuation of this medication. -It is possible that thrombocytopenia is due to bone marrow suppression from sepsis.  Platelet count already starting to improve today. -Continue to monitor platelets closely and transfuse if platelet count is less than 10,000 or active bleeding. -He has no evidence of iron deficiency or vitamin B12 deficiency.  Folate level slightly low.  He has been started on folic acid 1 mg daily.  Anemia may also be related to bone marrow suppression from sepsis.  Monitor and transfuse PRBCs for hemoglobin  less than 7. -ID following for management of bacteremia. -Management of chronic medical conditions per hospitalist.  Thank you for this referral.  Mikey Bussing, DNP, AGPCNP-BC, AOCNP  Attending Note  I personally saw the patient, reviewed the chart and examined the patient.  I agree with the assessment and plan as documented above.   Severe thrombocytopenia: In an acutely ill septic patient.  Differential diagnosis is broad ranging from HIT versus DIC versus sepsis related versus ITP Peripheral smear does not show schistocytes which rules out TTP. The timing of thrombocytopenia does not correlate well for development of HIT but since this is a readmission, it is still a probable diagnosis and hence we agree with current anticoagulation with argatroban. The platelet count appears to be slowly starting to improve.  Therefore no other procedures are necessary at this time. Peripheral leg ischemic changes  We will monitor closely along with you.

## 2021-09-04 NOTE — Progress Notes (Addendum)
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     Patient now with thrombocytopenia, ruling out for HIT with 4T score of 4 (intermediate probability).  Has been receiving SQ heparin since admit 12/13, with 50% drop in platelets.  Also noted to have nonblanching purple discoloration of feet, ischemic changes. Pharmacy consulted to dose argatroban for r/o HIT.  - first aPTT is 50 sec (slightly below goal) - per pt's RN, no bleeding noted  Goal of Therapy:  Goal aPTT 50-90 seconds Monitor platelets by anticoagulation protocol: Yes  Plan: - increase argatroban infusion rate to 0.6 mcg/kg/min - check 4 hr aPTT after rate change - monitor for s/sx bleeding - Follow up HIT antb and SRA  Dorna Leitz, PharmD, BCPS 09/04/2021 4:03 PM _____________________________________  Adden: aPTT collected 4 hours after rate increased to 0.6 mcg/kg/min is therapeutic at 62 sec - continue drip at 0.6 mcg/kg/min - check another 4 hr aPTT level at midnight to ensure level is still at goal before changing to q12h monitoring  Dorna Leitz, PharmD, BCPS 09/04/2021 8:39 PM

## 2021-09-04 NOTE — Assessment & Plan Note (Signed)
Stage IV left ischial tuberosity, sacrum mid deep tissue injury present on admission. Continue dressing changes per wound care.     Pressure Injury 08/29/21 Ischial tuberosity Left Stage 4 - Full thickness tissue loss with exposed bone, tendon or muscle. 1X7L3 (Active)  08/29/21 0100  Location: Ischial tuberosity  Location Orientation: Left  Staging: Stage 4 - Full thickness tissue loss with exposed bone, tendon or muscle.  Wound Description (Comments): [id number]  Present on Admission: Yes     Pressure Injury 09/01/21 Sacrum Mid Deep Tissue Pressure Injury - Purple or maroon localized area of discolored intact skin or blood-filled blister due to damage of underlying soft tissue from pressure and/or shear. small dark area in the middle of the s (Active)  09/01/21 0900  Location: Sacrum  Location Orientation: Mid  Staging: Deep Tissue Pressure Injury - Purple or maroon localized area of discolored intact skin or blood-filled blister due to damage of underlying soft tissue from pressure and/or shear.  Wound Description (Comments): small dark area in the middle of the sacrum  Present on Admission:

## 2021-09-04 NOTE — Assessment & Plan Note (Addendum)
Etiology unclear, differential include purpura fulminans in the setting of septic shock, ischemic changes in the setting of need for pressors, HIT including other conditions. -Changes have stabilized

## 2021-09-04 NOTE — TOC Progression Note (Signed)
Transition of Care [facility]) - Progression Note    Patient Details  Name: Steve [name] MRN: [id number] Date of Birth: 10-05-1990  Transition [facility]) CM/SW Contact  Golda Acre, RN Phone Number: 09/04/2021, 8:13 AM  Clinical Narrative:     Transition of Care [facility]) Screening Note   Patient Details  Name: Steve [name] Date of Birth: Sep 17, 1991   Transition of Care [facility]) CM/SW Contact:    Golda Acre, RN Phone Number: 09/04/2021, 8:13 AM    Transition of Care Department [facility]) has reviewed patient and no TOC needs have been identified at this time. We will continue to monitor patient advancement through interdisciplinary progression rounds. If new patient transition needs arise, please place a TOC consult.      Expected Discharge Plan: Home w Home Health Services Barriers to Discharge: Continued Medical Work up  Expected Discharge Plan and Services Expected Discharge Plan: Home w Home Health Services   Discharge Planning Services: CM Consult   Living arrangements for the past 2 months: Single Family Home                                       Social Determinants of Health (SDOH) Interventions    Readmission Risk Interventions No flowsheet data found.

## 2021-09-04 NOTE — Progress Notes (Signed)
ANTICOAGULATION CONSULT NOTE - Initial Consult  Pharmacy Consult for Argatroban Indication: Rule out HIT  Allergies  Allergen Reactions   Caffeine Anaphylaxis   Caffeine Anaphylaxis   Chocolate Anaphylaxis   Chocolate Anaphylaxis   Tuberculin     Other reaction(s): Eruption of skin   Sulfa Antibiotics Rash   Sulfa Antibiotics Rash    Patient Measurements: Height: 5\' 9"  (175.3 cm) Weight: 66.9 kg (147 lb 7.8 oz) IBW/kg (Calculated) : 70.7  Vital Signs: Temp: 95.8 F (35.4 C) (12/19 0800) Temp Source: Axillary (12/19 0800) BP: 109/65 (12/19 1000) Pulse Rate: 51 (12/19 1000)  Labs: Recent Labs    09/02/21 0320 09/03/21 0409 09/04/21 0817  HGB 10.5* 8.8* 9.1*  HCT 31.8* 27.6* 29.1*  PLT 37* 34* 59*  APTT  --   --  34  LABPROT  --   --  12.6  INR  --   --  0.9  CREATININE 0.74 0.69 0.52*    Estimated Creatinine Clearance: 126.6 mL/min (A) (by C-G formula based on SCr of 0.52 mg/dL (L)).   Medical History: Past Medical History:  Diagnosis Date   Anxiety    Paranoid schizophrenia ([facility])    Paraplegia ([facility])    PTSD (post-traumatic stress disorder) Paranoid    Medications:  Scheduled:   vitamin C  500 mg Oral BID   Chlorhexidine Gluconate Cloth  6 each Topical Daily   docusate sodium  100 mg Oral BID   feeding supplement  237 mL Oral TID BM   hydrocortisone sod succinate (SOLU-CORTEF) inj  100 mg Intravenous Q8H   mouth rinse  15 mL Mouth Rinse BID   midodrine  10 mg Oral TID WC   mupirocin ointment  1 application Nasal BID   OLANZapine zydis  10 mg Oral QHS   pantoprazole  40 mg Oral Daily   polyethylene glycol  17 g Oral Daily   sodium chloride flush  10-40 mL Intracatheter Q12H   zinc sulfate  220 mg Oral Daily   Infusions:   sodium chloride 250 mL (08/31/21 1422)   lactated ringers Stopped (09/03/21 1452)   meropenem (MERREM) IV Stopped (09/04/21 0545)   PRN: alum & mag hydroxide-simeth, ondansetron (ZOFRAN) IV, sodium chloride  flush  Assessment: 30 yo male with hx C-spine injury resulting in paraplegic, decubitus ulcers, anxiety, paranoid schizophrenia, and PTDS admitted with AMS and found to have ESBL Providencia Rettgeri bacteremia.    Patient now with thrombocytopenia, ruling out for HIT with 4T score of 4 (intermediate probability).  Has been receiving SQ heparin since admit 12/13, with 50% drop in platelets.  Also noted to have nonblanching purple discoloration of feet, ischemic changes. Pharmacy consulted to dose argatroban.  SCr, AST/ALT WNL.  Goal of Therapy:  Goal aPTT 50-90 seconds Monitor platelets by anticoagulation protocol: Yes   Plan:  Argatroban infusion 0.5 mcg/kg/min Check aPTT 4hrs after starting infusion Daily CBC Monitor closely for signs/symptoms of bleeding Follow up HIT Ab and SRA  1/14, PharmD, BCPS Pharmacy: [phone] 09/04/2021,10:31 AM

## 2021-09-04 NOTE — Assessment & Plan Note (Signed)
Body mass index is 21.78 kg/m.  Nutrition Problem: Moderate Malnutrition Etiology: social / environmental circumstances Nutrition Interventions: Interventions: Juven, Ensure Enlive (each supplement provides 350kcal and 20 grams of protein)

## 2021-09-04 NOTE — Assessment & Plan Note (Addendum)
Elevated in the setting of daptomycin use which was subsequently discontinued

## 2021-09-04 NOTE — Assessment & Plan Note (Signed)
Potassium level low.  Replaced.

## 2021-09-04 NOTE — Assessment & Plan Note (Signed)
Prior history. 

## 2021-09-04 NOTE — Assessment & Plan Note (Signed)
Psychiatry consulted.  Diagnosis of paranoid schizophrenia but not on any medication. Psychiatry recommends to continue Zyprexa initiated by PCCM and refer outpatient psychiatry for medication management.

## 2021-09-04 NOTE — Assessment & Plan Note (Signed)
Intubated on admission due to progressive altered mental status for a protection. Extubated next day.

## 2021-09-04 NOTE — Assessment & Plan Note (Addendum)
Most likely anemia secondary to chronic disease from osteomyelitis. H&H relatively stable.

## 2021-09-04 NOTE — Assessment & Plan Note (Signed)
UDS positive for meth, benzodiazepine and THC on admission. Prior history of cocaine use as well. Placing the patient at high risk for poor outcome. Patient has tendency of leaving AMA once he feels medically improved.

## 2021-09-04 NOTE — Assessment & Plan Note (Signed)
Elevated on admission.  Improving.

## 2021-09-04 NOTE — Assessment & Plan Note (Addendum)
INR elevated to 1.5 on 12/15 and has subsequently normalized.

## 2021-09-04 NOTE — Assessment & Plan Note (Signed)
Replaced. °

## 2021-09-04 NOTE — Assessment & Plan Note (Signed)
Elevated on admission.  In the setting of severe septic shock.  Improving.

## 2021-09-04 NOTE — Hospital Course (Addendum)
Steve [name] is a 30 y.o. 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 EMS noted shallow breathing and bagged patient. He had left AMA on 11/16. He was admitted on 11/9 and treated for MRSA bacteremia and osteomyelitis of the sacrum.  12/13-  Admitted by ICU team with fever 104, tachycardia, septic shock, started on pressors, had severe encephalopathy & intubated for airway protection, given broad spectrum antibiotics CVC placed by ICU team 12/14 Self extubated, very agitated and requiring restraints, remains on pressors, BLE mottled,  blood cultures 1/2 sets showing gram neg bacteria, treating with Meropenem - also receiving Dapto for prior h/o MRSA bacteriemia  12/15 Zyprexa increased, remains on levo + vaso. BLE with dark purple areas, non-blanching  12/16 Improved mental status, on levo, improved appearance of LE's, urine culture growing 30 K Proteus Mirabilis 12/17 given oritavancin 12/18 off of pressors 12/19 transferred to [facility] service, concern for HIT, hematology consulted.  Started on argatroban.  Psychiatry consulted for schizophrenia. ID recommends 7 days total of carbapenem 12/20 stopping fluids, and reducing steroids. Some chest pain.  12/21 HITT ruled out- ok to use heparin products, stopped Meropenem

## 2021-09-04 NOTE — Assessment & Plan Note (Addendum)
SARS COVID-positive on 12/13- asymptomatic

## 2021-09-04 NOTE — Assessment & Plan Note (Signed)
Continue antibiotic and wound care.

## 2021-09-04 NOTE — Consult Note (Signed)
[facility] Face-to-Face Psychiatry Consult   Reason for Consult:  Schizophrenia Referring Physician:  Dr. Posey Pronto  Patient Identification: Steve [name] MRN:  [id number] Principal Diagnosis: Sepsis due to Gram-negative organism with septic shock [facility]) Diagnosis:  Principal Problem:   Sepsis due to Gram-negative organism with septic shock [facility]) Active Problems:   Chronic post-traumatic stress disorder (PTSD)   Paraplegia (HCC)   Substance abuse ([place])   Decubitus ulcers   Anemia in other chronic diseases classified elsewhere   Sacral osteomyelitis (HCC)   Gram-negative bacteremia   Malnutrition of moderate degree   Respiratory failure requiring intubation (HCC)   Thrombocytopenia (HCC)   Schizophrenia, chronic condition with acute exacerbation (HCC)   Elevated CK   Acute lower UTI   Acute metabolic encephalopathy   AKI (acute kidney injury) ([place])   Lactic acidosis   Hypokalemia   Hypophosphatemia   Hypomagnesemia   Hyperbilirubinemia   Coagulopathy ([place])   COVID-19 virus infection   History of cervical spine trauma   Total Time spent with patient: 30 minutes  Subjective:   Steve [name] is a 30 y.o. male patient admitted with severe sepsis, requiring intubation, current medical work-up for heparin-induced thrombocytopenia.  Psychiatric consult place for schizophrenia, patient currently stable on Zyprexa 10 mg p.o. daily.  Patient's urine drug screen positive for methamphetamine, benzodiazepine, and THC.  Patient's mental status has improved significantly, following resolution of septic shock.  Patient states he feels okay, just tired.  He denies any recent decompensation of his schizophrenia, worsening depressive symptoms, mania, paranoia, psychosis, hallucinations.  Chart review shows patient was started on Zyprexa by pulmonology, dose increased and he appears to be tolerating this medication well as evident by improvement in his symptoms, and mentation.  Assessment today, patient  states he is just tired, however feels okay.  He describes his mood as okay, and affect is congruent.[name]  He denies feeling paranoid, his body language is suggestive of normal behavior.  He does have covers drawn over his head, however he acknowledges entry into the room, and quickly removes covers to engage in evaluation.  He answers all questions appropriately.  He does appear to have some insight and judgment, and seems to be appropriate throughout the psychiatric evaluation.  He denies any current auditory or visual hallucinations, suicidal ideations, homicidal ideations, ideas of reference, or first rank symptoms.  He endorses improvement, and just wants to get better.  He denies any physical complaints at this time.  Provider also check with nursing staff, to confirm patient's current behaviors today that what warrant further investigation and or inpatient psychiatric evaluation.  Nursing staff reports patient has been appropriate today and seems okay.  Recommend continuing current psychotropic medication, as patient appears to be responding appropriately and shows modest therapeutic response to medication.   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   Past Psychiatric History: Paranoid schizophrenia, polysubstance abuse, PTSD.   Risk to Self: Denies Risk to Others:   Prior Inpatient Therapy: Denies recently Prior Outpatient Therapy: Denies  Past Medical History:  Past Medical History:  Diagnosis Date   Anxiety    Paranoid schizophrenia ([place])    Paraplegia ([place])    PTSD (post-traumatic stress disorder) Paranoid    Past Surgical History:  Procedure Laterality Date   banding procedure for morbid obesity     I &  D EXTREMITY Right 02/27/2014   Procedure: IRRIGATION AND DEBRIDEMENT FOREARM AND REPAIR OF 30cm LACERATION;   Surgeon: Johnny Bridge, MD;  Location: [place];  Service: Orthopedics;  Laterality: Right;  Anesthesia Regional with MAC   TEE WITHOUT CARDIOVERSION N/A 08/01/2021   Procedure: TRANSESOPHAGEAL ECHOCARDIOGRAM (TEE);  Surgeon: Pixie Casino, MD;  Location: [facility] ENDOSCOPY;  Service: Cardiovascular;  Laterality: N/A;   Family History:  Family History  Problem Relation Age of Onset   Depression Mother    Bipolar disorder Father    Family Psychiatric  History: Denies Social History:  Social History   Substance and Sexual Activity  Alcohol Use Not Currently     Social History   Substance and Sexual Activity  Drug Use Yes   Types: Marijuana, Methamphetamines    Social History   Socioeconomic History   Marital status: Married    Spouse name: Not on file   Number of children: Not on file   Years of education: Not on file   Highest education level: Not on file  Occupational History   Not on file  Tobacco Use   Smoking status: Some Days    Packs/day: 0.25    Types: Cigarettes   Smokeless tobacco: Never  Vaping Use   Vaping Use: Unknown  Substance and Sexual Activity   Alcohol use: Not Currently   Drug use: Yes    Types: Marijuana, Methamphetamines   Sexual activity: Not Currently  Other Topics Concern   Not on file  Social History Narrative   Not on file   Social Determinants of Health   Financial Resource Strain: Not on file  Food Insecurity: Not on file  Transportation Needs: Not on file  Physical Activity: Not on file  Stress: Not on file  Social Connections: Not on file   Additional Social History:    Allergies:   Allergies  Allergen Reactions   Caffeine Anaphylaxis   Caffeine Anaphylaxis   Chocolate Anaphylaxis   Chocolate Anaphylaxis   Tuberculin     Other reaction(s): Eruption of skin   Sulfa Antibiotics Rash   Sulfa Antibiotics Rash    Labs:  Results for orders placed or performed during the hospital encounter of 08/29/21 (from the past 48  hour(s))  Glucose, capillary     Status: Abnormal   Collection Time: 09/02/21  5:44 PM  Result Value Ref Range   Glucose-Capillary 149 (H) 70 - 99 mg/dL    Comment: Glucose reference range applies only to samples taken after fasting for at least 8 hours.   Comment 1 Notify RN    Comment 2 Document in Chart   Glucose, capillary     Status: Abnormal   Collection Time: 09/02/21 11:39 PM  Result Value Ref Range   Glucose-Capillary 145 (H) 70 - 99 mg/dL    Comment: Glucose reference range applies only to samples taken after fasting for at least 8 hours.  Basic metabolic panel     Status: Abnormal   Collection Time: 09/03/21  4:09 AM  Result Value Ref Range   Sodium 139 135 - 145 mmol/L   Potassium 3.8 3.5 - 5.1 mmol/L    Comment: DELTA CHECK NOTED   Chloride 105 98 - 111 mmol/L   CO2 26 22 - 32 mmol/L   Glucose, Bld 98 70 - 99 mg/dL    Comment: Glucose reference range applies only to samples taken after fasting for at least 8 hours.   BUN 25 (H) 6 - 20 mg/dL  Creatinine, Ser 0.69 0.61 - 1.24 mg/dL   Calcium 8.4 (L) 8.9 - 10.3 mg/dL   GFR, Estimated >60 >60 mL/min    Comment: (NOTE) Calculated using the CKD-EPI Creatinine Equation (2021)    Anion gap 8 5 - 15    Comment: Performed at [facility], [place] [address] [place], [place] [address]  Magnesium     Status: None   Collection Time: 09/03/21  4:09 AM  Result Value Ref Range   Magnesium 2.0 1.7 - 2.4 mg/dL    Comment: Performed at [facility], [place] [address], [place], [place] [address]  Phosphorus     Status: Abnormal   Collection Time: 09/03/21  4:09 AM  Result Value Ref Range   Phosphorus 1.8 (L) 2.5 - 4.6 mg/dL    Comment: Performed at [facility], [place] [address] [place], [place] [address]  CBC     Status: Abnormal   Collection Time: 09/03/21  4:09 AM  Result Value Ref Range   WBC 14.1 (H) 4.0 - 10.5 K/uL   RBC 3.19 (L) 4.22 - 5.81 MIL/uL   Hemoglobin 8.8 (L)  13.0 - 17.0 g/dL   HCT 27.6 (L) 39.0 - 52.0 %   MCV 86.5 80.0 - 100.0 fL   MCH 27.6 26.0 - 34.0 pg   MCHC 31.9 30.0 - 36.0 g/dL   RDW 17.2 (H) 11.5 - 15.5 %   Platelets 34 (L) 150 - 400 K/uL    Comment: Immature Platelet Fraction may be clinically indicated, consider ordering this additional test [id number] CONSISTENT WITH PREVIOUS RESULT REPEATED TO VERIFY    nRBC 0.0 0.0 - 0.2 %    Comment: Performed at [facility], [place] [address] [place], [place] [address]  Glucose, capillary     Status: Abnormal   Collection Time: 09/03/21  6:29 AM  Result Value Ref Range   Glucose-Capillary 110 (H) 70 - 99 mg/dL    Comment: Glucose reference range applies only to samples taken after fasting for at least 8 hours.  Glucose, capillary     Status: Abnormal   Collection Time: 09/03/21 12:12 PM  Result Value Ref Range   Glucose-Capillary 113 (H) 70 - 99 mg/dL    Comment: Glucose reference range applies only to samples taken after fasting for at least 8 hours.   Comment 1 Notify RN    Comment 2 Document in Chart   Glucose, capillary     Status: Abnormal   Collection Time: 09/03/21  5:17 PM  Result Value Ref Range   Glucose-Capillary 105 (H) 70 - 99 mg/dL    Comment: Glucose reference range applies only to samples taken after fasting for at least 8 hours.  Glucose, capillary     Status: Abnormal   Collection Time: 09/03/21  7:58 PM  Result Value Ref Range   Glucose-Capillary 139 (H) 70 - 99 mg/dL    Comment: Glucose reference range applies only to samples taken after fasting for at least 8 hours.  Glucose, capillary     Status: Abnormal   Collection Time: 09/04/21 12:15 AM  Result Value Ref Range   Glucose-Capillary 120 (H) 70 - 99 mg/dL    Comment: Glucose reference range applies only to samples taken after fasting for at least 8 hours.  CBC with Differential/Platelet     Status: Abnormal   Collection Time: 09/04/21  8:17 AM  Result Value Ref Range   WBC 9.7 4.0 - 10.5  K/uL  RBC 3.34 (L) 4.22 - 5.81 MIL/uL   Hemoglobin 9.1 (L) 13.0 - 17.0 g/dL   HCT 29.1 (L) 39.0 - 52.0 %   MCV 87.1 80.0 - 100.0 fL   MCH 27.2 26.0 - 34.0 pg   MCHC 31.3 30.0 - 36.0 g/dL   RDW 16.7 (H) 11.5 - 15.5 %   Platelets 59 (L) 150 - 400 K/uL    Comment: Immature Platelet Fraction may be clinically indicated, consider ordering this additional test [id number] CONSISTENT WITH PREVIOUS RESULT    nRBC 0.0 0.0 - 0.2 %   Neutrophils Relative % 80 %   Neutro Abs 7.8 (H) 1.7 - 7.7 K/uL   Lymphocytes Relative 13 %   Lymphs Abs 1.3 0.7 - 4.0 K/uL   Monocytes Relative 5 %   Monocytes Absolute 0.5 0.1 - 1.0 K/uL   Eosinophils Relative 0 %   Eosinophils Absolute 0.0 0.0 - 0.5 K/uL   Basophils Relative 0 %   Basophils Absolute 0.0 0.0 - 0.1 K/uL   Immature Granulocytes 2 %   Abs Immature Granulocytes 0.17 (H) 0.00 - 0.07 K/uL   Reactive, Benign Lymphocytes PRESENT     Comment: Performed at [facility], [place] [address], [place], [place] [address]  Comprehensive metabolic panel     Status: Abnormal   Collection Time: 09/04/21  8:17 AM  Result Value Ref Range   Sodium 136 135 - 145 mmol/L   Potassium 3.7 3.5 - 5.1 mmol/L   Chloride 105 98 - 111 mmol/L   CO2 27 22 - 32 mmol/L   Glucose, Bld 145 (H) 70 - 99 mg/dL    Comment: Glucose reference range applies only to samples taken after fasting for at least 8 hours.   BUN 26 (H) 6 - 20 mg/dL   Creatinine, Ser 0.52 (L) 0.61 - 1.24 mg/dL   Calcium 8.0 (L) 8.9 - 10.3 mg/dL   Total Protein 5.7 (L) 6.5 - 8.1 g/dL   Albumin 2.1 (L) 3.5 - 5.0 g/dL   AST 28 15 - 41 U/L   ALT 41 0 - 44 U/L   Alkaline Phosphatase 176 (H) 38 - 126 U/L   Total Bilirubin 0.7 0.3 - 1.2 mg/dL   GFR, Estimated >60 >60 mL/min    Comment: (NOTE) Calculated using the CKD-EPI Creatinine Equation (2021)    Anion gap 4 (L) 5 - 15    Comment: Performed at [facility], [place] [address] [place], [place] [address]  Magnesium      Status: Abnormal   Collection Time: 09/04/21  8:17 AM  Result Value Ref Range   Magnesium 1.6 (L) 1.7 - 2.4 mg/dL    Comment: Performed at [facility], [place] [address], [place], [place] [address]  Phosphorus     Status: Abnormal   Collection Time: 09/04/21  8:17 AM  Result Value Ref Range   Phosphorus 2.1 (L) 2.5 - 4.6 mg/dL    Comment: Performed at [facility], [place] [address] [place], [place] [address]  Protime-INR     Status: None   Collection Time: 09/04/21  8:17 AM  Result Value Ref Range   Prothrombin Time 12.6 11.4 - 15.2 seconds   INR 0.9 0.8 - 1.2    Comment: (NOTE) INR goal varies based on device and disease states. Performed at [facility], [place] [address] [place], [place] [address]   Technologist smear review     Status: None   Collection  Time: 09/04/21  8:17 AM  Result Value Ref Range   RBC MORPHOLOGY POLYCHROMASIA PRESENT     Comment: Performed at [facility], [place] [address], [place], [place] [address]  APTT     Status: None   Collection Time: 09/04/21  8:17 AM  Result Value Ref Range   aPTT 34 24 - 36 seconds    Comment: Performed at [facility], [place] [address], [place], [place] [address]  Type and screen [place]     Status: None   Collection Time: 09/04/21  9:34 AM  Result Value Ref Range   ABO/RH(D) A POS    Antibody Screen NEG    Sample Expiration      09/07/2021,2359 Performed at [place] [address], [place], [place] [address]   Vitamin B12     Status: Abnormal   Collection Time: 09/04/21  9:34 AM  Result Value Ref Range   Vitamin B-12 2,094 (H) 180 - 914 pg/mL    Comment: RESULTS CONFIRMED BY MANUAL DILUTION (NOTE) This assay is not validated for testing neonatal or myeloproliferative syndrome specimens for Vitamin B12 levels. Performed at [facility], [place] [address], [place], [place] [address]   Folate, serum, performed at [facility] lab     Status: Abnormal   Collection Time: 09/04/21  9:34 AM  Result Value Ref Range   Folate 5.3 (L) >5.9 ng/mL    Comment: Performed at [facility], [place] [address] [place], [place] [address]  Iron and TIBC     Status: Abnormal   Collection Time: 09/04/21  9:34 AM  Result Value Ref Range   Iron 71 45 - 182 ug/dL   TIBC 171 (L) 250 - 450 ug/dL   Saturation Ratios 41 (H) 17.9 - 39.5 %   UIBC 100 ug/dL    Comment: Performed at [facility], [place] [address] [place], [place] [address]  Ferritin     Status: Abnormal   Collection Time: 09/04/21  9:34 AM  Result Value Ref Range   Ferritin 612 (H) 24 - 336 ng/mL    Comment: Performed at [facility], [place] [address] [place], [place] [address]  Glucose, capillary     Status: Abnormal   Collection Time: 09/04/21 11:45 AM  Result Value Ref Range   Glucose-Capillary 174 (H) 70 - 99 mg/dL    Comment: Glucose reference range applies only to samples taken after fasting for at least 8 hours.    Current Facility-Administered Medications  Medication Dose Route Frequency Provider Last Rate Last Admin   0.9 %  sodium chloride infusion  250 mL Intravenous Continuous Candee Furbish, MD 10 mL/hr at 08/31/21 1422 250 mL at 08/31/21 1422   acetaminophen (TYLENOL) tablet 650 mg  650 mg Oral Q6H PRN Lavina Hamman, MD       Or   acetaminophen (TYLENOL) suppository 650 mg  650 mg Rectal Q6H PRN Lavina Hamman, MD       alum & mag hydroxide-simeth (MAALOX/MYLANTA) 200-200-20 MG/5ML suspension 30 mL  30 mL Oral Q6H PRN Olalere, Adewale A, MD   30 mL at 09/03/21 1937   argatroban 1 mg/mL infusion  0.5 mcg/kg/min Intravenous Continuous Emiliano Dyer, RPH 2.01 mL/hr at 09/04/21 1511 0.5 mcg/kg/min at 09/04/21 1511   ascorbic acid (VITAMIN C) tablet 500 mg  500 mg Oral BID Candee Furbish, MD   500 mg at  09/04/21 0946    Chlorhexidine Gluconate Cloth 2 % PADS 6 each  6 each Topical Daily Icard, Bradley L, DO   6 each at 09/04/21 1510   docusate sodium (COLACE) capsule 100 mg  100 mg Oral BID Candee Furbish, MD   100 mg at 08/31/21 2035   feeding supplement (ENSURE ENLIVE / ENSURE PLUS) liquid 237 mL  237 mL Oral TID BM Candee Furbish, MD   237 mL at 74/25/95 6387   folic acid (FOLVITE) tablet 1 mg  1 mg Oral Daily Lavina Hamman, MD   1 mg at 09/04/21 1340   hydrocortisone sodium succinate (SOLU-CORTEF) 100 MG injection 100 mg  100 mg Intravenous Q8H Ollis, Brandi L, NP   100 mg at 09/04/21 0949   lactated ringers infusion   Intravenous Continuous Olalere, Adewale A, MD 50 mL/hr at 09/04/21 1511 Infusion Verify at 09/04/21 1511   MEDLINE mouth rinse  15 mL Mouth Rinse BID Icard, Bradley L, DO   15 mL at 09/04/21 0947   meropenem (MERREM) 1 g in sodium chloride 0.9 % 100 mL IVPB  1 g Intravenous Q8H [place], FNP   Stopped at 09/04/21 1412   midodrine (PROAMATINE) tablet 10 mg  10 mg Oral TID WC Ollis, Brandi L, NP   10 mg at 09/04/21 1116   mupirocin ointment (BACTROBAN) 2 % 1 application  1 application Nasal BID Olalere, Adewale A, MD   1 application at 56/43/32 0947   OLANZapine zydis (ZYPREXA) disintegrating tablet 10 mg  10 mg Oral QHS Ollis, Brandi L, NP   10 mg at 09/03/21 2156   ondansetron (ZOFRAN) tablet 4 mg  4 mg Oral Q6H PRN Lavina Hamman, MD       Or   ondansetron [facility]) injection 4 mg  4 mg Intravenous Q6H PRN Lavina Hamman, MD       pantoprazole (PROTONIX) EC tablet 40 mg  40 mg Oral Daily Olalere, Adewale A, MD   40 mg at 09/04/21 0947   polyethylene glycol (MIRALAX / GLYCOLAX) packet 17 g  17 g Oral Daily Candee Furbish, MD       potassium PHOSPHATE 15 mmol in dextrose 5 % 250 mL infusion  15 mmol Intravenous Once Lavina Hamman, MD 43 mL/hr at 09/04/21 1511 Infusion Verify at 09/04/21 1511   senna-docusate (Senokot-S) tablet 1 tablet  1 tablet Oral QHS PRN Lavina Hamman, MD        sodium chloride flush (NS) 0.9 % injection 10-40 mL  10-40 mL Intracatheter Q12H Lavina Hamman, MD   10 mL at 09/04/21 0948   sodium chloride flush (NS) 0.9 % injection 10-40 mL  10-40 mL Intracatheter PRN Lavina Hamman, MD       traMADol Veatrice [place]bon) tablet 50 mg  50 mg Oral Q6H PRN Lavina Hamman, MD       zinc sulfate capsule 220 mg  220 mg Oral Daily Candee Furbish, MD   220 mg at 09/04/21 9518    Musculoskeletal: Strength & Muscle Tone: within normal limits Gait & Station: normal Patient leans: N/A            Psychiatric Specialty Exam:  Presentation  General Appearance: Disheveled  Eye Contact:Minimal  Speech:Slow  Speech Volume:Normal  Handedness:Right   Mood and Affect  Mood:Anxious (Im ok, just tired)  Affect:Congruent; Constricted   Thought Process  Thought Processes:Linear; Coherent  Descriptions of Associations:Intact  Orientation:Full (Time, Place  and Person)  Thought Content:WDL  History of Schizophrenia/Schizoaffective disorder:No data recorded Duration of Psychotic Symptoms:No data recorded Hallucinations:Hallucinations: None  Ideas of Reference:None  Suicidal Thoughts:Suicidal Thoughts: No  Homicidal Thoughts:Homicidal Thoughts: No   Sensorium  Memory:Immediate Fair; Remote Fair; Recent Poor  Judgment:Fair  Insight:Fair   Executive Functions  Concentration:Fair  Attention Span:Fair  [place]   Psychomotor Activity  Psychomotor Activity:Psychomotor Activity: Normal   Assets  Assets:Communication Skills; Desire for Improvement; Financial Resources/InsuranceRetail banker; Social Support   Sleep  Sleep:Sleep: Fair   Physical Exam: Physical Exam ROS Blood pressure 124/78, pulse (!) 55, temperature (!) 97.5 F (36.4 C), temperature source Oral, resp. rate (!) 23, height _0  (1.753 m), weight 66.9 kg, SpO2 100 %. Body mass index is 21.78 kg/m.  Treatment  Plan Summary: Plan continue Zyprexa Zydis 10 mg p.o. daily.  Patient appears to be having modest therapeutic response.  He is not displaying any acute psychiatric symptoms that would warrant inpatient psychiatric admission at this time, or ongoing psychiatric follow-up during this hospitalization.  Patient also has a diagnosis of PTSD, and denies any current symptoms.  -Recommend referral to outpatient psychiatry for medication management, behavior modification, and possibly long-acting injectable to help with stabilization and maintenance in the future. -Patient will benefit from substance abuse intensive outpatient programming, due to polysubstance abuse.  Disposition: No evidence of imminent risk to self or others at present.   Patient does not meet criteria for psychiatric inpatient admission. Supportive therapy provided about ongoing stressors. Discussed crisis plan, support from social network, calling 911, coming to the Emergency Department, and calling Suicide Hotline. Refer to Substance abuse/chemical dependency intensive outpatient program  Suella Broad, [facility] 09/04/2021 3:17 PM

## 2021-09-05 DIAGNOSIS — M549 Dorsalgia, unspecified: Secondary | ICD-10-CM

## 2021-09-05 LAB — CBC WITH DIFFERENTIAL/PLATELET
Abs Immature Granulocytes: 0.25 10*3/uL — ABNORMAL HIGH (ref 0.00–0.07)
Basophils Absolute: 0 10*3/uL (ref 0.0–0.1)
Basophils Relative: 0 %
Eosinophils Absolute: 0 10*3/uL (ref 0.0–0.5)
Eosinophils Relative: 0 %
HCT: 28.1 % — ABNORMAL LOW (ref 39.0–52.0)
Hemoglobin: 8.9 g/dL — ABNORMAL LOW (ref 13.0–17.0)
Immature Granulocytes: 3 %
Lymphocytes Relative: 14 %
Lymphs Abs: 1.2 10*3/uL (ref 0.7–4.0)
MCH: 27.5 pg (ref 26.0–34.0)
MCHC: 31.7 g/dL (ref 30.0–36.0)
MCV: 86.7 fL (ref 80.0–100.0)
Monocytes Absolute: 0.5 10*3/uL (ref 0.1–1.0)
Monocytes Relative: 6 %
Neutro Abs: 7 10*3/uL (ref 1.7–7.7)
Neutrophils Relative %: 77 %
Platelets: 89 10*3/uL — ABNORMAL LOW (ref 150–400)
RBC: 3.24 MIL/uL — ABNORMAL LOW (ref 4.22–5.81)
RDW: 16.6 % — ABNORMAL HIGH (ref 11.5–15.5)
WBC: 9 10*3/uL (ref 4.0–10.5)
nRBC: 0 % (ref 0.0–0.2)

## 2021-09-05 LAB — BASIC METABOLIC PANEL
Anion gap: 5 (ref 5–15)
BUN: 26 mg/dL — ABNORMAL HIGH (ref 6–20)
CO2: 29 mmol/L (ref 22–32)
Calcium: 8.1 mg/dL — ABNORMAL LOW (ref 8.9–10.3)
Chloride: 103 mmol/L (ref 98–111)
Creatinine, Ser: 0.48 mg/dL — ABNORMAL LOW (ref 0.61–1.24)
GFR, Estimated: >60 mL/min (ref >60)
Glucose, Bld: 107 mg/dL — ABNORMAL HIGH (ref 70–99)
Potassium: 3.9 mmol/L (ref 3.5–5.1)
Sodium: 137 mmol/L (ref 135–145)

## 2021-09-05 LAB — GLUCOSE, CAPILLARY
Glucose-Capillary: 108 mg/dL — ABNORMAL HIGH (ref 70–99)
Glucose-Capillary: 124 mg/dL — ABNORMAL HIGH (ref 70–99)
Glucose-Capillary: 158 mg/dL — ABNORMAL HIGH (ref 70–99)
Glucose-Capillary: 143 mg/dL — ABNORMAL HIGH (ref 70–99)

## 2021-09-05 LAB — APTT
aPTT: 38 seconds — ABNORMAL HIGH (ref 24–36)
aPTT: 47 seconds — ABNORMAL HIGH (ref 24–36)
aPTT: 49 seconds — ABNORMAL HIGH (ref 24–36)

## 2021-09-05 LAB — MAGNESIUM: Magnesium: 1.8 mg/dL (ref 1.7–2.4)

## 2021-09-05 LAB — HEPARIN INDUCED PLATELET AB (HIT ANTIBODY): Heparin Induced Plt Ab: 0.13 OD (ref 0.000–0.400)

## 2021-09-05 MED ORDER — HYDROCORTISONE SOD SUC (PF) 100 MG IJ SOLR
100.0000 mg | Freq: Every day | INTRAMUSCULAR | Status: DC
  Administered 2021-09-06: 11:00:00 100 mg via INTRAVENOUS
  Filled 2021-09-05: qty 2

## 2021-09-05 MED ORDER — MIDODRINE HCL 5 MG PO TABS
5.0000 mg | ORAL_TABLET | Freq: Three times a day (TID) | ORAL | Status: DC
  Administered 2021-09-05 – 2021-09-08 (×9): 5 mg via ORAL
  Filled 2021-09-05 (×10): qty 1

## 2021-09-05 MED ORDER — ARGATROBAN 50 MG/50ML IV SOLN
1.1200 ug/kg/min | INTRAVENOUS | Status: DC
  Administered 2021-09-05: 14:00:00 1.12 ug/kg/min via INTRAVENOUS
  Filled 2021-09-05 (×2): qty 50

## 2021-09-05 MED ORDER — OXYCODONE HCL 5 MG PO TABS
5.0000 mg | ORAL_TABLET | ORAL | Status: DC | PRN
  Administered 2021-09-05 – 2021-09-07 (×7): 5 mg via ORAL
  Filled 2021-09-05 (×8): qty 1

## 2021-09-05 MED ORDER — HYDROCORTISONE SOD SUC (PF) 100 MG IJ SOLR
100.0000 mg | Freq: Two times a day (BID) | INTRAMUSCULAR | Status: AC
  Administered 2021-09-05 (×2): 100 mg via INTRAVENOUS
  Filled 2021-09-05 (×2): qty 2

## 2021-09-05 MED ORDER — PHENOL 1.4 % MT LIQD
1.0000 | OROMUCOSAL | Status: DC | PRN
  Administered 2021-09-06: 04:00:00 1 via OROMUCOSAL
  Filled 2021-09-05: qty 177

## 2021-09-05 NOTE — Progress Notes (Signed)
ANTICOAGULATION CONSULT NOTE - Follow Up Consult  Pharmacy Consult for Argatroban Indication: Rule out HIT  Allergies  Allergen Reactions   Caffeine Anaphylaxis   Caffeine Anaphylaxis   Chocolate Anaphylaxis   Chocolate Anaphylaxis   Tuberculin     Other reaction(s): Eruption of skin   Sulfa Antibiotics Rash   Sulfa Antibiotics Rash    Patient Measurements: Height: 5\' 9"  (175.3 cm) Weight: 73 kg (160 lb 15 oz) IBW/kg (Calculated) : 70.7  Vital Signs: Temp: 97.5 F (36.4 C) (12/20 0740) Temp Source: Oral (12/20 0740) BP: 131/82 (12/20 0700) Pulse Rate: 47 (12/20 0700)  Labs: Recent Labs    09/03/21 0409 09/04/21 0817 09/04/21 1536 09/05/21 0005 09/05/21 0402 09/05/21 1008  HGB 8.8* 9.1*  --   --  8.9*  --   HCT 27.6* 29.1*  --   --  28.1*  --   PLT 34* 59*  --   --  89*  --   APTT  --  34   < > 47* 49* 38*  LABPROT  --  12.6  --   --   --   --   INR  --  0.9  --   --   --   --   CREATININE 0.69 0.52*  --   --  0.48*  --    < > = values in this interval not displayed.     Estimated Creatinine Clearance: 133.8 mL/min (A) (by C-G formula based on SCr of 0.48 mg/dL (L)).   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    Patient now with thrombocytopenia, ruling out for HIT with 4T score of 4 (intermediate probability).  Has been receiving SQ heparin since admit 12/13, with 50% drop in platelets.  Also noted to have nonblanching purple discoloration of feet, ischemic changes. Pharmacy consulted to dose argatroban.  SCr, AST/ALT WNL.  Today, 09/05/2021 PTT is subtherapeutic on 0.8 mcg/kg/min CBC: Hgb low but stable, Plts improving 89 No interruptions in infusion per RN No bleeding reported  HIT Ab and SRA pending  Goal of Therapy:  Goal aPTT 50-90 seconds Monitor platelets by anticoagulation  protocol: Yes   Plan:  Increase Argatroban infusion by 40% (1.12 mcg/kg/min) Check aPTT 4hrs after increasing infusion Daily CBC Monitor closely for signs/symptoms of bleeding Follow up HIT Ab and SRA  09/07/2021, PharmD, BCPS Pharmacy: [phone] 09/05/2021,11:37 AM

## 2021-09-05 NOTE — Progress Notes (Signed)
Patient on argatroban while HIT workup in process Heparin Ab (OD) returned WNL (<0.2) Will stop argatroban per MD  Bernadene Person, PharmD, BCPS [phone] 09/05/2021, 6:37 PM

## 2021-09-05 NOTE — Progress Notes (Addendum)
[facility] Progress Note  Patient: Steve [name]    XBM:[id number]  DOA: 08/29/2021    Date of Service: the patient was seen and examined on 09/05/2021  Brief hospital course: 12/13 Admitted with sepsis  12/14 Self extubated, remains on pressors. BLE mottled 12/15 Zyprexa increased, remains on 48mg levo + vaso. BLE with dark purple areas, non-blanching  12/16 Improved mental status, on 248m levo, improved appearance of LE's 12/18 off of pressors 12[facility] concern for [facility], hematology consulted.  Started on argatroban.  Psychiatry consulted for schizophrenia. 12/20 stopping fluids, and reducing steroids. Some chestpain.   Assessment and Plan: * Sepsis due to Gram-negative organism with septic shock (HCSpauldingPresents with UTI, osteomyelitis as well as recent COVID infection. Met SIRS criteria on admission with hypotension, tachycardia, thrombocytopenia. Required intubation for altered mental status change.  Required pressors as well. On IV Solu-Cortef for refractory shock since 12/14 IV meropenem since 12/13 IV daptomycin 12/14- 12/16, stopped due to worsening CK. IV oritavancin 12/17 IV Levophed till 12/17, IV vasopressin till 12/16.  IV phenylephrine on 12/13. Blood culture 12/13 positive for Providencia rettgeri, sensitivity to Cipro, imipenem, Zosyn.  Urine culture 12/13 positive for Proteus.  MRSA PCR positive. Currently sepsis physiology improving. Still on midodrine. Dose reduced to 5 TID.  Still on steroids. Will taper Still on IV antibiotics.   Still has a central line. Will maintain as poor IV access and thrombocytopenia.  Gram-negative bacteremia ID on board. Recommend to complete total 7-day of therapy and then stop Carbapenem. SP vancomycin on admission for MRSA bacteremia, daptomycin through 12/17 and then given 1200 mg of oritavancin.  Sacral osteomyelitis (HCMont[place] CreekContinue antibiotic and wound care.  Coagulopathy (HCC) INR elevated.   Currently improving.  Acute thrombocytopenia.  Thrombocytopenia (HCKings GrantHIT ruled out.  Acute.   Platelet count went as low as 30.  Now improving. No schistocytes on the smear.  DIC panel negative as well.  Recently hospitalized in November.  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  HIT panel negative  SRS sent.   Patient does have necrosis appearing in lower extremity which can be seen rarely in HIT and therefore we initiated therapeutic argatroban. Not stopped.  Appreciate hematology consultation.  Anemia in other chronic diseases classified elsewhere Most likely anemia secondary to chronic disease from osteomyelitis. H&H relatively stable. No evidence of schistocytes.  Ischemic changes of bilateral lower extremity Progressively worsening since admission. Etiology unclear, differential include purpura fulminans in the setting of septic shock, ischemic changes in the setting of need for pressors, HIT including other conditions. Monitor for now. Due to paraplegia currently no pain. May need to consider consultation with vascular surgery and plastic surgery if continues to progress further.  Respiratory failure requiring intubation (HCBrownstownIntubated on admission due to progressive altered mental status for a protection. Extubated next day.  Acute metabolic encephalopathy Confused and agitated on admission.  Mentation improved with improvement in sepsis physiology. Appears to be multifactorial in the setting of sepsis, schizophrenia, medication side effect, substance abuse.  Schizophrenia, chronic condition with acute exacerbation (HSaint [facility] HospitalPsychiatry consulted.  Diagnosis of paranoid schizophrenia but not on any medication. Psychiatry recommends to continue Zyprexa initiated by PCCM and refer outpatient psychiatry for medication management.    Substance abuse (HCFernandina BeachUDS positive for meth, benzodiazepine and THC on admission. Prior history of cocaine use as well. Placing the patient at high risk  for poor outcome. Patient has tendency of leaving AMA once he feels medically improved.  Hypomagnesemia Replaced.  Hypophosphatemia Replaced.  Hypokalemia Potassium level low.  Replaced.  AKI (acute kidney injury) ([place]) Baseline serum creatinine 0.6.  On admission serum creatinine 1.95 worsened to the point of 3.17.  Now improving to normal.  Monitor.  Back pain Add oxy IR.  COVID-19 virus infection Recent infection. No indication for airborne precautions with CT value of 38. Placing the patient at high risk for poor outcome.  Hyperbilirubinemia Elevated on admission.  Improving.  Lactic acidosis Elevated on admission.  In the setting of severe septic shock.  Improving.  Elevated CK Elevated in the setting of daptomycin use. Currently medication discontinued. Monitor.    Decubitus ulcers Stage IV left ischial tuberosity, sacrum mid deep tissue injury present on admission. Continue dressing changes per wound care.     Pressure Injury 08/29/21 Ischial tuberosity Left Stage 4 - Full thickness tissue loss with exposed bone, tendon or muscle. [id number] (Active)  08/29/21 0100  Location: Ischial tuberosity  Location Orientation: Left  Staging: Stage 4 - Full thickness tissue loss with exposed bone, tendon or muscle.  Wound Description (Comments): [id number]  Present on Admission: Yes     Pressure Injury 09/01/21 Sacrum Mid Deep Tissue Pressure Injury - Purple or maroon localized area of discolored intact skin or blood-filled blister due to damage of underlying soft tissue from pressure and/or shear. small dark area in the middle of the s (Active)  09/01/21 0900  Location: Sacrum  Location Orientation: Mid  Staging: Deep Tissue Pressure Injury - Purple or maroon localized area of discolored intact skin or blood-filled blister due to damage of underlying soft tissue from pressure and/or shear.  Wound Description (Comments): small dark area in the middle of the sacrum  Present on  Admission:      Paraplegia [facility]) Prior history.    Malnutrition of moderate degree Body mass index is 21.78 kg/m.  Nutrition Problem: Moderate Malnutrition Etiology: social / environmental circumstances Nutrition Interventions: Interventions: Juven, Ensure Enlive (each supplement provides 350kcal and 20 grams of protein)     Subjective: no acute events overnight. Has some back pain and gastritis.   Objective: Vital signs were reviewed and unremarkable.  Exam: General: Appear in mild distress, no Rash; skin necrosis remaining stable. Oral Mucosa Clear, moist. no Abnormal Neck Mass Or lumps, Conjunctiva normal  Cardiovascular: S1 and S2 Present, no Murmur, Respiratory: good respiratory effort, Bilateral Air entry present and CTA, basal Crackles, no wheezes Abdomen: Bowel Sound present, Soft and no tenderness Extremities: bilateral  Pedal edema Neurology: alert and oriented to time, place, and person affect appropriate. no new focal deficit Gait not checked due to patient safety concerns    Data Reviewed: Improving platelets stable HB  Disposition:  Status is: Inpatient  Remains inpatient appropriate because: need IV Antibiotics and further monitoring of CBC   Family Communication: none at bedside  DVT Prophylaxis: SCDs Start: 08/29/21 1408   Time spent: 35 minutes.   Author: Berle Mull  09/05/2021 9:29 PM  To reach On-call, see care teams to locate the attending and reach out via [email or web address]. Between 7PM-7AM, please contact night-coverage If you still have difficulty reaching the attending provider, please page the [facility] (Director on Call) for [facility] on amion for assistance.

## 2021-09-05 NOTE — Progress Notes (Signed)
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     Patient now with thrombocytopenia, ruling out for HIT with 4T score of 4 (intermediate probability).  Has been receiving SQ heparin since admit 12/13, with 50% drop in platelets.  Also noted to have nonblanching purple discoloration of feet, ischemic changes. Pharmacy consulted to dose argatroban for r/o HIT.  - aPTT is 49 sec (slightly below goal) - per pt's RN, no bleeding noted  Goal of Therapy:  Goal aPTT 50-90 seconds Monitor platelets by anticoagulation protocol: Yes  Plan: - increase argatroban infusion rate to 0.8 mcg/kg/min - check 4 hr aPTT after rate change - monitor for s/sx bleeding - Follow up HIT antb and SRA  [name] RPh 09/05/2021, 4:38 AM

## 2021-09-05 NOTE — Progress Notes (Signed)
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     Patient now with thrombocytopenia, ruling out for HIT with 4T score of 4 (intermediate probability).  Has been receiving SQ heparin since admit 12/13, with 50% drop in platelets.  Also noted to have nonblanching purple discoloration of feet, ischemic changes. Pharmacy consulted to dose argatroban for r/o HIT.  - aPTT is 47 sec (slightly below goal) - per pt's RN, no bleeding noted  Goal of Therapy:  Goal aPTT 50-90 seconds Monitor platelets by anticoagulation protocol: Yes  Plan: - increase argatroban infusion rate to 0.7 mcg/kg/min - check 4 hr aPTT after rate change - monitor for s/sx bleeding - Follow up HIT antb and SRA  [name] RPh 09/05/2021, 12:46 AM

## 2021-09-05 NOTE — Assessment & Plan Note (Addendum)
Using as needed oxy IR.

## 2021-09-06 DIAGNOSIS — A4902 Methicillin resistant Staphylococcus aureus infection, unspecified site: Secondary | ICD-10-CM

## 2021-09-06 LAB — BASIC METABOLIC PANEL
Anion gap: 5 (ref 5–15)
BUN: 26 mg/dL — ABNORMAL HIGH (ref 6–20)
CO2: 30 mmol/L (ref 22–32)
Calcium: 8 mg/dL — ABNORMAL LOW (ref 8.9–10.3)
Chloride: 101 mmol/L (ref 98–111)
Creatinine, Ser: 0.53 mg/dL — ABNORMAL LOW (ref 0.61–1.24)
GFR, Estimated: >60 mL/min (ref >60)
Glucose, Bld: 105 mg/dL — ABNORMAL HIGH (ref 70–99)
Potassium: 3.8 mmol/L (ref 3.5–5.1)
Sodium: 136 mmol/L (ref 135–145)

## 2021-09-06 LAB — CBC WITH DIFFERENTIAL/PLATELET
Abs Immature Granulocytes: 0.49 10*3/uL — ABNORMAL HIGH (ref 0.00–0.07)
Basophils Absolute: 0 10*3/uL (ref 0.0–0.1)
Basophils Relative: 0 %
Eosinophils Absolute: 0 10*3/uL (ref 0.0–0.5)
Eosinophils Relative: 0 %
HCT: 29.5 % — ABNORMAL LOW (ref 39.0–52.0)
Hemoglobin: 9.2 g/dL — ABNORMAL LOW (ref 13.0–17.0)
Immature Granulocytes: 6 %
Lymphocytes Relative: 12 %
Lymphs Abs: 1 10*3/uL (ref 0.7–4.0)
MCH: 26.9 pg (ref 26.0–34.0)
MCHC: 31.2 g/dL (ref 30.0–36.0)
MCV: 86.3 fL (ref 80.0–100.0)
Monocytes Absolute: 0.6 10*3/uL (ref 0.1–1.0)
Monocytes Relative: 7 %
Neutro Abs: 6.2 10*3/uL (ref 1.7–7.7)
Neutrophils Relative %: 75 %
Platelets: 127 10*3/uL — ABNORMAL LOW (ref 150–400)
RBC: 3.42 MIL/uL — ABNORMAL LOW (ref 4.22–5.81)
RDW: 16.4 % — ABNORMAL HIGH (ref 11.5–15.5)
WBC: 8.2 10*3/uL (ref 4.0–10.5)
nRBC: 0 % (ref 0.0–0.2)

## 2021-09-06 LAB — GLUCOSE, CAPILLARY
Glucose-Capillary: 106 mg/dL — ABNORMAL HIGH (ref 70–99)
Glucose-Capillary: 136 mg/dL — ABNORMAL HIGH (ref 70–99)
Glucose-Capillary: 143 mg/dL — ABNORMAL HIGH (ref 70–99)

## 2021-09-06 LAB — SEROTONIN RELEASE ASSAY (SRA)
SRA .2 IU/mL UFH Ser-aCnc: <1 % (ref 0–20)
SRA 100IU/mL UFH Ser-aCnc: <1 % (ref 0–20)

## 2021-09-06 MED ORDER — JUVEN PO PACK
1.0000 | PACK | Freq: Two times a day (BID) | ORAL | Status: DC
  Administered 2021-09-06 – 2021-09-08 (×4): 1 via ORAL
  Filled 2021-09-06 (×5): qty 1

## 2021-09-06 MED ORDER — MAGIC MOUTHWASH
5.0000 mL | Freq: Four times a day (QID) | ORAL | Status: DC
  Administered 2021-09-06 – 2021-09-08 (×8): 5 mL via ORAL
  Filled 2021-09-06 (×9): qty 5

## 2021-09-06 MED ORDER — HYDROCORTISONE SOD SUC (PF) 100 MG IJ SOLR
50.0000 mg | Freq: Every day | INTRAMUSCULAR | Status: AC
  Administered 2021-09-07: 10:00:00 50 mg via INTRAVENOUS
  Filled 2021-09-06: qty 1

## 2021-09-06 NOTE — Assessment & Plan Note (Signed)
-   30,000 colonies of Proteus noted on urine culture on 12/13-pansensitive-treated x7 days

## 2021-09-06 NOTE — Progress Notes (Signed)
Nutrition Follow-up  DOCUMENTATION CODES:   Non-severe (moderate) malnutrition in context of social or environmental circumstances  INTERVENTION:  - continue Ensure Enlive TID. - will order 1 packet Juven BID, each packet provides 95 calories, 2.5 grams of protein (collagen), and 9.8 grams of carbohydrate (3 grams sugar); also contains 7 grams of L-arginine and L-glutamine, 300 mg vitamin C, 15 mg vitamin E, 1.2 mcg vitamin B-12, 9.5 mg zinc, 200 mg calcium, and 1.5 g  Calcium Beta-hydroxy-Beta-methylbutyrate to support wound healing.   NUTRITION DIAGNOSIS:   Moderate Malnutrition related to social / environmental circumstances as evidenced by moderate fat depletion, moderate muscle depletion. -ongoing  GOAL:   Patient will meet greater than or equal to 90% of their needs -likely minimally meeting on average  MONITOR:   PO intake, Supplement acceptance, Labs, Weight trends, Skin  ASSESSMENT:   30 y.o. male with medical history of C-spine injury resulting in paraplegia and decubitus ulcers, anxiety, paranoid schizophrenia, and PTDS. He presented to the ED after being found altered by his family. In the ED he was noted to have shallow respirations. He was intubated 12/13 afternoon and extubated 12/14 AM.  Able to talk with RN prior to visit to patient's room. He prefers to go by [employer].   The only documented meal completion percentage was 95% of breakfast on 12/19.  Patient laying in bed with blankets pulled over his head. No visitors present at the time of RD visit.   Patient's responses were limited. He shared that he ate potatoes for breakfast. Lunch tray was being brought in as RD was leaving patient's room.    He confirms that he has been drinking Ensure, review of order indicates that he is accepting this supplement 100% of the time offered. Discussed the importance and rationale of oral nutrition supplements to aid in wound healing.  Weight has been fluctuating throughout  hospitalization. Mild pitting edema to BLE documented in the edema section of flow sheet.    Labs reviewed; CBGs: 106 and 136 mg/dl, BUN: 26 mg/dl, creatinine: 8.03 mg/dl, Ca: 8 mg/dl.  Medications reviewed; 500 mg ascorbic acid BID, 100 mg colace BID, 1 mg folvite/day, 100 mg solu-cortef/day, 40 mg oral protonix/day, 17 g miralax/day, 220 mg zinc sulfate/day.    Diet Order:   Diet Order             Diet regular Room service appropriate? Yes; Fluid consistency: Thin  Diet effective now                   EDUCATION NEEDS:   Not appropriate for education at this time  Skin:  Skin Assessment: Skin Integrity Issues: Skin Integrity Issues:: Stage IV, Other (Comment) Stage IV: L IT Other: R mid-back pink superficial wound with no wound depth noted per WOC  Last BM:  12/20 (type 6 x3, all medium amount)  Height:   Ht Readings from Last 1 Encounters:  08/29/21 5\' 9"  (1.753 m)    Weight:   Wt Readings from Last 1 Encounters:  09/06/21 72.3 kg     Estimated Nutritional Needs:  Kcal:  2300-2500 kcal Protein:  120-130 grams Fluid:  >/= 2.3 L/day     09/08/21, MS, RD, LDN, CNSC Inpatient Clinical Dietitian RD pager # available in AMION  After hours/weekend pager # available in [facility]

## 2021-09-06 NOTE — Progress Notes (Signed)
Hematology  Labs reviewed CBC Latest Ref Rng & Units 09/06/2021 09/05/2021 09/04/2021  WBC 4.0 - 10.5 K/uL 8.2 9.0 9.7  Hemoglobin 13.0 - 17.0 g/dL 5.6(Y) 8.9(L) 9.1(L)  Hematocrit 39.0 - 52.0 % 29.5(L) 28.1(L) 29.1(L)  Platelets 150 - 400 K/uL 127(L) 89(L) 59(L)   HIT Ab: 0.13 (negative) SRA pending  Thrombocytopenia: resolved Ok to use heparin products if needed Will sign off

## 2021-09-06 NOTE — Progress Notes (Addendum)
[facility] Progress Note  Patient: Steve [name]     [id number]  DOA: 08/29/2021      Date of Service: the patient was seen and examined on 09/07/2021  Brief hospital course: Steve [name] is a 30 y.o. 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 EMS noted shallow breathing and bagged patient. He had left AMA on 11/16. He was admitted on 11/9 and treated for MRSA bacteremia and osteomyelitis of the sacrum.  12/13-  Admitted by ICU team with fever 104, tachycardia, septic shock, started on pressors, had severe encephalopathy & intubated for airway protection, given broad spectrum antibiotics CVC placed by ICU team 12/14 Self extubated, very agitated and requiring restraints, remains on pressors, BLE mottled,  blood cultures 1/2 sets showing gram neg bacteria, treating with Meropenem - also receiving Dapto for prior h/o MRSA bacteriemia  12/15 Zyprexa increased, remains on 30mcg levo + vaso. BLE with dark purple areas, non-blanching  12/16 Improved mental status, on 45mcg levo, improved appearance of LE's, urine culture growing 30 K Proteus Mirabilis 12/17 given oritavancin 12/18 off of pressors 12/19 transferred to [facility] service, concern for HIT, hematology consulted.  Started on argatroban.  Psychiatry consulted for schizophrenia. ID recommends 7 days total of carbapenem 12/20 stopping fluids, and reducing steroids. Some chest pain.  12/21 HITT ruled out- ok to use heparin products, stopped Meropenem   Subjective: he states he feels fine- keeps his head covered and does not communicate much  Assessment and Plan: * Sepsis due to Gram-negative organism with septic shock ([place]) - Blood culture positive for [place] Rettgeri on 08/29/2021 -Given imipenem for total of 7 days as recommended by ID-stop date  09/06/2021 -On stress dose steroids-hydrocortisone is being weaned and is now down to 100 mg twice daily-we will give 50 mg twice daily tomorrow - Also on midodrine 5 mg 3 times daily which I will continue for now  MRSA bacteremia and osteomyelitis of the ischium - MRSA positive on 07/26/2021 - Has completed vancomycin, daptomycin and oritavancin  AKI (acute kidney injury) ([place]) - Baseline serum creatinine 0.6.   - On admission serum creatinine 1.95 worsened to the point of 3.17.  -Has subsequently improving to normal.  Back pain Using as needed oxy IR.  Ischemic changes of bilateral lower extremity Etiology unclear, differential include purpura fulminans in the setting of septic shock, ischemic changes in the setting of need for pressors, HIT including other conditions. -Changes have stabilized   COVID-19 virus infection SARS COVID-positive on 12/13- asymptomatic  Coagulopathy (HCC) INR elevated to 1.5 on 12/15 and has subsequently normalized.    Hyperbilirubinemia Elevated on admission.  Improving.  Hypomagnesemia Replaced.  Hypophosphatemia Replaced.  Hypokalemia Potassium level low.  Replaced.  Lactic acidosis Elevated on admission.  In the setting of severe septic shock.  Improving.  Acute metabolic encephalopathy Confused and agitated on admission.   Mentation improved with improvement in sepsis physiology.   Acute lower UTI-secondary to Proteus - 30,000 colonies of Proteus noted on urine culture on 12/13-pansensitive-treated x7 days  Elevated CK Elevated in the setting of daptomycin use which was subsequently discontinued    Schizophrenia, chronic condition with acute exacerbation [facility]) Psychiatry consulted.  Diagnosis of paranoid schizophrenia but not on any medication. Psychiatry recommends to continue Zyprexa initiated by PCCM and refer outpatient psychiatry for medication management.    Acute thrombocytopenia (  [place]) HIT ruled out.  Platelet nadir  was 30 and has subsequently improved No schistocytes on the smear.  DIC panel negative as well.   Appreciate hematology consultation    Respiratory failure requiring intubation ([place]) Intubated on admission due to progressive altered mental status for a protection. Extubated next day.  Malnutrition of moderate degree Body mass index is 21.78 kg/m.  Nutrition Problem: Moderate Malnutrition Etiology: social / environmental circumstances Nutrition Interventions: Interventions: Juven, Ensure Enlive (each supplement provides 350kcal and 20 grams of protein)    Anemia in other chronic diseases classified elsewhere Most likely anemia secondary to chronic disease from osteomyelitis. H&H relatively stable.   Decubitus ulcers Stage IV left ischial tuberosity, sacrum mid deep tissue injury present on admission. Continue dressing changes per wound care.     Pressure Injury 08/29/21 Ischial tuberosity Left Stage 4 - Full thickness tissue loss with exposed bone, tendon or muscle. [id number] (Active)  08/29/21 0100  Location: Ischial tuberosity  Location Orientation: Left  Staging: Stage 4 - Full thickness tissue loss with exposed bone, tendon or muscle.  Wound Description (Comments): [id number]  Present on Admission: Yes     Pressure Injury 09/01/21 Sacrum Mid Deep Tissue Pressure Injury - Purple or maroon localized area of discolored intact skin or blood-filled blister due to damage of underlying soft tissue from pressure and/or shear. small dark area in the middle of the s (Active)  09/01/21 0900  Location: Sacrum  Location Orientation: Mid  Staging: Deep Tissue Pressure Injury - Purple or maroon localized area of discolored intact skin or blood-filled blister due to damage of underlying soft tissue from pressure and/or shear.  Wound Description (Comments): small dark area in the middle of the sacrum  Present on Admission:      Substance abuse ([place]) UDS positive for meth, benzodiazepine and THC  on admission. Prior history of cocaine use as well. Placing the patient at high risk for poor outcome. Patient has tendency of leaving AMA once he feels medically improved.  Paraplegia [facility]) Prior history.      Body mass index is 23.54 kg/m.  Nutrition Problem: Moderate Malnutrition Etiology: social / environmental circumstances Pressure Injury 05/30/21 Elbow Left;Posterior Stage 2 -  Partial thickness loss of dermis presenting as a shallow open injury with a red, pink wound bed without slough. (Active)  05/30/21 1935  Location: Elbow  Location Orientation: Left;Posterior  Staging: Stage 2 -  Partial thickness loss of dermis presenting as a shallow open injury with a red, pink wound bed without slough.  Wound Description (Comments):   Present on Admission: Yes     Pressure Injury 05/30/21 Heel Left Deep Tissue Pressure Injury - Purple or maroon localized area of discolored intact skin or blood-filled blister due to damage of underlying soft tissue from pressure and/or shear. (Active)  05/30/21 1935  Location: Heel  Location Orientation: Left  Staging: Deep Tissue Pressure Injury - Purple or maroon localized area of discolored intact skin or blood-filled blister due to damage of underlying soft tissue from pressure and/or shear.  Wound Description (Comments):   Present on Admission: Yes     Pressure Injury 05/30/21 Back Right;Mid Stage 2 -  Partial thickness loss of dermis presenting as a shallow open injury with a red, pink wound bed without slough. (Active)  05/30/21 1936  Location: Back  Location Orientation: Right;Mid  Staging: Stage 2 -  Partial thickness loss of dermis presenting as a shallow open injury with a red, pink wound bed without slough.  Wound Description (Comments):   Present on Admission: Yes     Pressure Injury 05/30/21 Back Lateral;Right Stage 2 -  Partial thickness loss of dermis presenting as a shallow open injury with a red, pink wound bed without slough.  (Active)  05/30/21 1937  Location: Back  Location Orientation: Lateral;Right  Staging: Stage 2 -  Partial thickness loss of dermis presenting as a shallow open injury with a red, pink wound bed without slough.  Wound Description (Comments):   Present on Admission: Yes     Pressure Injury 05/30/21 Ankle Left;Lateral Stage 3 -  Full thickness tissue loss. Subcutaneous fat may be visible but bone, tendon or muscle are NOT exposed. (Active)  05/30/21 1938  Location: Ankle  Location Orientation: Left;Lateral  Staging: Stage 3 -  Full thickness tissue loss. Subcutaneous fat may be visible but bone, tendon or muscle are NOT exposed.  Wound Description (Comments):   Present on Admission: Yes     Pressure Injury 05/30/21 Ankle Right;Medial Stage 2 -  Partial thickness loss of dermis presenting as a shallow open injury with a red, pink wound bed without slough. (Active)  05/30/21 1939  Location: Ankle  Location Orientation: Right;Medial  Staging: Stage 2 -  Partial thickness loss of dermis presenting as a shallow open injury with a red, pink wound bed without slough.  Wound Description (Comments):   Present on Admission: Yes     Pressure Injury 05/30/21 Hip Left Stage 4 - Full thickness tissue loss with exposed bone, tendon or muscle. (Active)  05/30/21 1944  Location: Hip  Location Orientation: Left  Staging: Stage 4 - Full thickness tissue loss with exposed bone, tendon or muscle.  Wound Description (Comments):   Present on Admission: Yes     Pressure Injury 08/29/21 Ischial tuberosity Left Stage 4 - Full thickness tissue loss with exposed bone, tendon or muscle. [id number] (Active)  08/29/21 0100  Location: Ischial tuberosity  Location Orientation: Left  Staging: Stage 4 - Full thickness tissue loss with exposed bone, tendon or muscle.  Wound Description (Comments): [id number]  Present on Admission: Yes     Pressure Injury 09/01/21 Sacrum Mid Deep Tissue Pressure Injury - Purple or maroon  localized area of discolored intact skin or blood-filled blister due to damage of underlying soft tissue from pressure and/or shear. small dark area in the middle of the s (Active)  09/01/21 0900  Location: Sacrum  Location Orientation: Mid  Staging: Deep Tissue Pressure Injury - Purple or maroon localized area of discolored intact skin or blood-filled blister due to damage of underlying soft tissue from pressure and/or shear.  Wound Description (Comments): small dark area in the middle of the sacrum  Present on Admission:      Time spent in minutes: 40 DVT prophylaxis: SCDs Start: 08/29/21 1408  Code Status: Full code Level of Care: Level of care: Med-Surg Disposition Plan:  Status is: Inpatient  Remains inpatient appropriate because: weaning stress dose steroids    Objective:   Vitals:   09/06/21 1700 09/06/21 1807 09/06/21 2212 09/07/21 0526  BP: 113/76 121/70 117/74 (!) 108/47  Pulse:  63 61 62  Resp: 17 14 17 16   Temp:  97.7 F (36.5 C) 98.4 F (36.9 C) 98.2 F (36.8 C)  TempSrc:  Oral Oral Oral  SpO2:  99% 100% 99%  Weight:      Height:       Filed Weights   09/04/21 0500 09/05/21 0500 09/06/21 0347  Weight: 66.9 kg 73 kg  72.3 kg    Exam: General exam: Appears comfortable  HEENT: PERRLA, oral mucosa moist, no sclera icterus or thrush Respiratory system: Clear to auscultation. Respiratory effort normal. Cardiovascular system: S1 & S2 heard, regular rate and rhythm Gastrointestinal system: Abdomen soft, non-tender, nondistended. Normal bowel sounds   Central nervous system: Alert and oriented. No focal neurological deficits. Extremities: purple discoloration of feet and lower legs Psychiatry:  flat affect   Imaging and lab data Reviewed    CBC: Recent Labs  Lab 09/03/21 0409 09/04/21 0817 09/05/21 0402 09/06/21 0335 09/07/21 0323  WBC 14.1* 9.7 9.0 8.2 5.3  NEUTROABS  --  7.8* 7.0 6.2 3.2  HGB 8.8* 9.1* 8.9* 9.2* 8.9*  HCT 27.6* 29.1* 28.1* 29.5*  28.0*  MCV 86.5 87.1 86.7 86.3 87.5  PLT 34* 59* 89* 127* 145*   Basic Metabolic Panel: Recent Labs  Lab 09/01/21 0502 09/02/21 0320 09/03/21 0409 09/04/21 0817 09/05/21 0402 09/06/21 0335 09/07/21 0323  NA 122* 136 139 136 137 136 136  K 2.7* 3.0* 3.8 3.7 3.9 3.8 3.5  CL 87* 104 105 105 103 101 104  CO2 23 26 26 27 29 30 29   GLUCOSE 100* 108* 98 145* 107* 105* 122*  BUN 29* 26* 25* 26* 26* 26* 24*  CREATININE 0.80 0.74 0.69 0.52* 0.48* 0.53* 0.49*  CALCIUM 7.6* 8.4* 8.4* 8.0* 8.1* 8.0* 7.6*  MG 1.9 2.4 2.0 1.6* 1.8  --   --   PHOS 3.1 2.5 1.8* 2.1*  --   --   --    GFR: Estimated Creatinine Clearance: 133.8 mL/min (A) (by C-G formula based on SCr of 0.49 mg/dL (L)).  Time spent: 35 minutes.   Author:  09/07/2021 8:17 AM  To reach On-call, see care teams to locate the attending and reach out via [email or web address]. Between 7PM-7AM, please contact night-coverage If you still have difficulty reaching the attending provider, please page the [facility] (Director on Call) for [facility] on amion for assistance.

## 2021-09-06 NOTE — Assessment & Plan Note (Addendum)
-   MRSA positive on 07/26/2021 - Has completed vancomycin, daptomycin and oritavancin

## 2021-09-07 LAB — CBC WITH DIFFERENTIAL/PLATELET
Abs Immature Granulocytes: 0.31 10*3/uL — ABNORMAL HIGH (ref 0.00–0.07)
Basophils Absolute: 0 10*3/uL (ref 0.0–0.1)
Basophils Relative: 0 %
Eosinophils Absolute: 0.1 10*3/uL (ref 0.0–0.5)
Eosinophils Relative: 2 %
HCT: 28 % — ABNORMAL LOW (ref 39.0–52.0)
Hemoglobin: 8.9 g/dL — ABNORMAL LOW (ref 13.0–17.0)
Immature Granulocytes: 6 %
Lymphocytes Relative: 26 %
Lymphs Abs: 1.4 10*3/uL (ref 0.7–4.0)
MCH: 27.8 pg (ref 26.0–34.0)
MCHC: 31.8 g/dL (ref 30.0–36.0)
MCV: 87.5 fL (ref 80.0–100.0)
Monocytes Absolute: 0.3 10*3/uL (ref 0.1–1.0)
Monocytes Relative: 6 %
Neutro Abs: 3.2 10*3/uL (ref 1.7–7.7)
Neutrophils Relative %: 60 %
Platelets: 145 10*3/uL — ABNORMAL LOW (ref 150–400)
RBC: 3.2 MIL/uL — ABNORMAL LOW (ref 4.22–5.81)
RDW: 16.5 % — ABNORMAL HIGH (ref 11.5–15.5)
WBC: 5.3 10*3/uL (ref 4.0–10.5)
nRBC: 0 % (ref 0.0–0.2)

## 2021-09-07 LAB — GLUCOSE, CAPILLARY
Glucose-Capillary: 105 mg/dL — ABNORMAL HIGH (ref 70–99)
Glucose-Capillary: 124 mg/dL — ABNORMAL HIGH (ref 70–99)
Glucose-Capillary: 78 mg/dL (ref 70–99)
Glucose-Capillary: 90 mg/dL (ref 70–99)
Glucose-Capillary: 93 mg/dL (ref 70–99)

## 2021-09-07 LAB — BASIC METABOLIC PANEL
Anion gap: 3 — ABNORMAL LOW (ref 5–15)
BUN: 24 mg/dL — ABNORMAL HIGH (ref 6–20)
CO2: 29 mmol/L (ref 22–32)
Calcium: 7.6 mg/dL — ABNORMAL LOW (ref 8.9–10.3)
Chloride: 104 mmol/L (ref 98–111)
Creatinine, Ser: 0.49 mg/dL — ABNORMAL LOW (ref 0.61–1.24)
GFR, Estimated: >60 mL/min (ref >60)
Glucose, Bld: 122 mg/dL — ABNORMAL HIGH (ref 70–99)
Potassium: 3.5 mmol/L (ref 3.5–5.1)
Sodium: 136 mmol/L (ref 135–145)

## 2021-09-07 MED ORDER — OLANZAPINE 10 MG PO TBDP
10.0000 mg | ORAL_TABLET | Freq: Every day | ORAL | Status: DC
  Filled 2021-09-07: qty 1

## 2021-09-07 MED ORDER — OLANZAPINE 5 MG PO TBDP
10.0000 mg | ORAL_TABLET | Freq: Every day | ORAL | Status: DC
  Filled 2021-09-07: qty 2

## 2021-09-07 MED ORDER — OLANZAPINE 5 MG PO TBDP
10.0000 mg | ORAL_TABLET | Freq: Every day | ORAL | Status: DC
  Administered 2021-09-07: 22:00:00 10 mg via ORAL
  Filled 2021-09-07: qty 2

## 2021-09-07 MED ORDER — ENOXAPARIN SODIUM 40 MG/0.4ML IJ SOSY
40.0000 mg | PREFILLED_SYRINGE | INTRAMUSCULAR | Status: DC
  Administered 2021-09-07 – 2021-09-08 (×2): 40 mg via SUBCUTANEOUS
  Filled 2021-09-07 (×2): qty 0.4

## 2021-09-07 NOTE — Progress Notes (Addendum)
[facility] Progress Note  Patient: Steve [name]     [id number]  DOA: 08/29/2021      Date of Service: the patient was seen and examined on 09/08/2021  Brief hospital course: Steve [name] is a 30 y.o. 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 EMS noted shallow breathing and bagged patient. He had left AMA on 11/16. He was admitted on 11/9 and treated for MRSA bacteremia and osteomyelitis of the sacrum.  12/13-  Admitted by ICU team with fever 104, tachycardia, septic shock, started on pressors, had severe encephalopathy & intubated for airway protection, given broad spectrum antibiotics CVC placed by ICU team 12/14 Self extubated, very agitated and requiring restraints, remains on pressors, BLE mottled,  blood cultures 1/2 sets showing gram neg bacteria, treating with Meropenem - also receiving Dapto for prior h/o MRSA bacteriemia  12/15 Zyprexa increased, remains on 76mcg levo + vaso. BLE with dark purple areas, non-blanching  12/16 Improved mental status, on 53mcg levo, improved appearance of LE's, urine culture growing 30 K Proteus Mirabilis 12/17 given oritavancin 12/18 off of pressors 12/19 transferred to [facility] service, concern for HIT, hematology consulted.  Started on argatroban.  Psychiatry consulted for schizophrenia. ID recommends 7 days total of carbapenem 12/20 stopping fluids, and reducing steroids. Some chest pain.  12/21 HITT ruled out- ok to use heparin products, stopped Meropenem   Subjective:  Asking if it is morning or night. Does not know where he is. Complains of pain in mouth.   Assessment and Plan: * Sepsis due to Gram-negative organism with septic shock ([place])- (present on admission) - Blood culture positive for [place] Rettgeri on 08/29/2021 -Given imipenem for total of 7 days as  recommended by ID-stop date 09/06/2021 - Stress dose steroids being weaned - Also on midodrine 5 mg 3 times daily which I will continue for now  MRSA bacteremia and osteomyelitis of the ischium - MRSA positive on 07/26/2021 - Has completed vancomycin, daptomycin and oritavancin  AKI (acute kidney injury) ([place])- (present on admission) - Baseline serum creatinine 0.6.   - On admission serum creatinine 1.95 worsened to the point of 3.17.  -Has subsequently improving to normal.  Acute metabolic encephalopathy- (present on admission) Confused and agitated on admission.   Mentation improved with improvement in sepsis physiology. NO longer agitated but very confused to time, place and situation now-    Back pain Using as needed oxy IR.  Ischemic changes of bilateral lower extremity Etiology unclear, differential include purpura fulminans in the setting of septic shock, ischemic changes in the setting of need for pressors, HIT including other conditions. -Changes have stabilized   COVID-19 virus infection- (present on admission) SARS COVID-positive on 12/13- asymptomatic  Coagulopathy ([place])- (present on admission) INR elevated to 1.5 on 12/15 and has subsequently normalized.    Hyperbilirubinemia- (present on admission) Elevated on admission.  Improving.  Hypomagnesemia- (present on admission) Replaced.  Hypophosphatemia- (present on admission) Replaced.  Hypokalemia- (present on admission) Potassium level low.  Replaced.  Lactic acidosis- (present on admission) Elevated on admission.  In the setting of severe septic shock.  Improving.  Acute lower UTI-secondary to Proteus- (present on admission) - 30,000 colonies of Proteus noted on urine culture on 12/13-pansensitive-treated x7 days  Elevated CK- (present on admission) Elevated in the setting of daptomycin use which was subsequently discontinued    Schizophrenia,  chronic condition with acute exacerbation [facility])- (present  on admission) Psychiatry consulted.  Diagnosis of paranoid schizophrenia but not on any medication. Psychiatry recommends to continue Zyprexa initiated by PCCM and refer outpatient psychiatry for medication management.    Acute thrombocytopenia (HCC)- (present on admission) HIT ruled out.  Platelet nadir was 30 and has subsequently improved No schistocytes on the smear.  DIC panel negative as well.   Appreciate hematology consultation    Respiratory failure requiring intubation (HCC)- (present on admission) Intubated on admission due to progressive altered mental status for a protection. Extubated next day.  Malnutrition of moderate degree- (present on admission) Body mass index is 21.78 kg/m.  Nutrition Problem: Moderate Malnutrition Etiology: social / environmental circumstances Nutrition Interventions: Interventions: Juven, Ensure Enlive (each supplement provides 350kcal and 20 grams of protein)    Anemia in other chronic diseases classified elsewhere- (present on admission) Most likely anemia secondary to chronic disease from osteomyelitis. H&H relatively stable.   Decubitus ulcers- (present on admission) Stage IV left ischial tuberosity, sacrum mid deep tissue injury present on admission. Continue dressing changes per wound care.     Pressure Injury 08/29/21 Ischial tuberosity Left Stage 4 - Full thickness tissue loss with exposed bone, tendon or muscle. 6E3P2 (Active)  08/29/21 0100  Location: Ischial tuberosity  Location Orientation: Left  Staging: Stage 4 - Full thickness tissue loss with exposed bone, tendon or muscle.  Wound Description (Comments): [id number]  Present on Admission: Yes     Pressure Injury 09/01/21 Sacrum Mid Deep Tissue Pressure Injury - Purple or maroon localized area of discolored intact skin or blood-filled blister due to damage of underlying soft tissue from pressure and/or shear. small dark area in the middle of the s (Active)  09/01/21 0900   Location: Sacrum  Location Orientation: Mid  Staging: Deep Tissue Pressure Injury - Purple or maroon localized area of discolored intact skin or blood-filled blister due to damage of underlying soft tissue from pressure and/or shear.  Wound Description (Comments): small dark area in the middle of the sacrum  Present on Admission:      Substance abuse (HCC)- (present on admission) UDS positive for meth, benzodiazepine and THC on admission. Prior history of cocaine use as well. Placing the patient at high risk for poor outcome. Patient has tendency of leaving AMA once he feels medically improved.  Paraplegia (HCC)- (present on admission) Prior history.       Nutrition Problem: Moderate Malnutrition Etiology: social / environmental circumstances Pressure Injury 05/30/21 Elbow Left;Posterior Stage 2 -  Partial thickness loss of dermis presenting as a shallow open injury with a red, pink wound bed without slough. (Active)  05/30/21 1935  Location: Elbow  Location Orientation: Left;Posterior  Staging: Stage 2 -  Partial thickness loss of dermis presenting as a shallow open injury with a red, pink wound bed without slough.  Wound Description (Comments):   Present on Admission: Yes     Pressure Injury 05/30/21 Heel Left Deep Tissue Pressure Injury - Purple or maroon localized area of discolored intact skin or blood-filled blister due to damage of underlying soft tissue from pressure and/or shear. (Active)  05/30/21 1935  Location: Heel  Location Orientation: Left  Staging: Deep Tissue Pressure Injury - Purple or maroon localized area of discolored intact skin or blood-filled blister due to damage of underlying soft tissue from pressure and/or shear.  Wound Description (Comments):   Present on Admission: Yes     Pressure Injury 05/30/21 Back Right;Mid Stage 2 -  Partial thickness loss of dermis presenting as a shallow open injury with a red, pink wound bed without slough. (Active)   05/30/21 1936  Location: Back  Location Orientation: Right;Mid  Staging: Stage 2 -  Partial thickness loss of dermis presenting as a shallow open injury with a red, pink wound bed without slough.  Wound Description (Comments):   Present on Admission: Yes     Pressure Injury 05/30/21 Back Lateral;Right Stage 2 -  Partial thickness loss of dermis presenting as a shallow open injury with a red, pink wound bed without slough. (Active)  05/30/21 1937  Location: Back  Location Orientation: Lateral;Right  Staging: Stage 2 -  Partial thickness loss of dermis presenting as a shallow open injury with a red, pink wound bed without slough.  Wound Description (Comments):   Present on Admission: Yes     Pressure Injury 05/30/21 Ankle Left;Lateral Stage 3 -  Full thickness tissue loss. Subcutaneous fat may be visible but bone, tendon or muscle are NOT exposed. (Active)  05/30/21 1938  Location: Ankle  Location Orientation: Left;Lateral  Staging: Stage 3 -  Full thickness tissue loss. Subcutaneous fat may be visible but bone, tendon or muscle are NOT exposed.  Wound Description (Comments):   Present on Admission: Yes     Pressure Injury 05/30/21 Ankle Right;Medial Stage 2 -  Partial thickness loss of dermis presenting as a shallow open injury with a red, pink wound bed without slough. (Active)  05/30/21 1939  Location: Ankle  Location Orientation: Right;Medial  Staging: Stage 2 -  Partial thickness loss of dermis presenting as a shallow open injury with a red, pink wound bed without slough.  Wound Description (Comments):   Present on Admission: Yes     Pressure Injury 05/30/21 Hip Left Stage 4 - Full thickness tissue loss with exposed bone, tendon or muscle. (Active)  05/30/21 1944  Location: Hip  Location Orientation: Left  Staging: Stage 4 - Full thickness tissue loss with exposed bone, tendon or muscle.  Wound Description (Comments):   Present on Admission: Yes     Pressure Injury  08/29/21 Ischial tuberosity Left Stage 4 - Full thickness tissue loss with exposed bone, tendon or muscle. [id number] (Active)  08/29/21 0100  Location: Ischial tuberosity  Location Orientation: Left  Staging: Stage 4 - Full thickness tissue loss with exposed bone, tendon or muscle.  Wound Description (Comments): [id number]  Present on Admission: Yes     Pressure Injury 09/01/21 Sacrum Mid Deep Tissue Pressure Injury - Purple or maroon localized area of discolored intact skin or blood-filled blister due to damage of underlying soft tissue from pressure and/or shear. small dark area in the middle of the s (Active)  09/01/21 0900  Location: Sacrum  Location Orientation: Mid  Staging: Deep Tissue Pressure Injury - Purple or maroon localized area of discolored intact skin or blood-filled blister due to damage of underlying soft tissue from pressure and/or shear.  Wound Description (Comments): small dark area in the middle of the sacrum  Present on Admission:      Time spent in minutes: 35 DVT prophylaxis: enoxaparin (LOVENOX) injection 40 mg Start: 09/07/21 1200 SCDs Start: 08/29/21 1408   Code Status:   Code Status: Full Code  Level of Care: Level of care: Med-Surg Disposition Plan:  Status is: Inpatient  Remains inpatient appropriate because: encephalopathic      Objective:   Vitals:   09/07/21 0926 09/07/21 1340 09/07/21 2135 09/08/21 0608  BP: (!) 95/44 (!) 104/56 122/76 [name])  113/58  Pulse: 90 86 83 61  Resp: 18 14 18 16   Temp: 98.2 F (36.8 C) 98.5 F (36.9 C) 97.9 F (36.6 C) 98.3 F (36.8 C)  TempSrc: Oral Oral Oral Oral  SpO2: 99% 99% 99%   Weight:      Height:       Filed Weights   09/04/21 0500 09/05/21 0500 09/06/21 0347  Weight: 66.9 kg 73 kg 72.3 kg    Exam: General exam: Appears comfortable  HEENT: PERRLA, has numerous scabs on lips and tongue  Respiratory system: Clear to auscultation. Respiratory effort normal. Cardiovascular system: S1 & S2 heard, regular  rate and rhythm Gastrointestinal system: Abdomen soft, non-tender, nondistended. Normal bowel sounds   Central nervous system: Alert and oriented only to person, paraplegic. Extremities: has edema and purple discoloration of feet and lower legs Psychiatry:  pleasantly confused  Imaging and lab data Reviewed    CBC: Recent Labs  Lab 09/03/21 0409 09/04/21 0817 09/05/21 0402 09/06/21 0335 09/07/21 0323  WBC 14.1* 9.7 9.0 8.2 5.3  NEUTROABS  --  7.8* 7.0 6.2 3.2  HGB 8.8* 9.1* 8.9* 9.2* 8.9*  HCT 27.6* 29.1* 28.1* 29.5* 28.0*  MCV 86.5 87.1 86.7 86.3 87.5  PLT 34* 59* 89* 127* [id number]*   Basic Metabolic Panel: Recent Labs  Lab 09/02/21 0320 09/03/21 0409 09/04/21 0817 09/05/21 0402 09/06/21 0335 09/07/21 0323 09/08/21 0349  NA 136 139 136 137 136 136 137  K 3.0* 3.8 3.7 3.9 3.8 3.5 3.8  CL 104 105 105 103 101 104 103  CO2 26 26 27 29 30 29 30   GLUCOSE 108* 98 145* 107* 105* 122* 83  BUN 26* 25* 26* 26* 26* 24* 24*  CREATININE 0.74 0.69 0.52* 0.48* 0.53* 0.49* 0.44*  CALCIUM 8.4* 8.4* 8.0* 8.1* 8.0* 7.6* 7.8*  MG 2.4 2.0 1.6* 1.8  --   --   --   PHOS 2.5 1.8* 2.1*  --   --   --   --    GFR: Estimated Creatinine Clearance: 133.8 mL/min (A) (by C-G formula based on SCr of 0.44 mg/dL (L)).  Time spent: 35 minutes.   Author: Debbe Odea  09/08/2021 7:28 AM

## 2021-09-07 NOTE — Plan of Care (Signed)
  Problem: Education: Goal: Knowledge of General Education information will improve Description: Including pain rating scale, medication(s)/side effects and non-pharmacologic comfort measures Outcome: Progressing   Problem: Clinical Measurements: Goal: Ability to maintain clinical measurements within normal limits will improve Outcome: Progressing   Problem: Nutrition: Goal: Adequate nutrition will be maintained Outcome: Progressing   Problem: Coping: Goal: Level of anxiety will decrease Outcome: Progressing   Problem: Elimination: Goal: Will not experience complications related to bowel motility Outcome: Progressing   Problem: Pain Managment: Goal: General experience of comfort will improve Outcome: Progressing   Problem: Safety: Goal: Ability to remain free from injury will improve Outcome: Progressing   Problem: Skin Integrity: Goal: Risk for impaired skin integrity will decrease Outcome: Progressing   

## 2021-09-08 ENCOUNTER — Other Ambulatory Visit (HOSPITAL_COMMUNITY): Payer: Self-pay

## 2021-09-08 DIAGNOSIS — N390 Urinary tract infection, site not specified: Secondary | ICD-10-CM

## 2021-09-08 DIAGNOSIS — N179 Acute kidney failure, unspecified: Secondary | ICD-10-CM

## 2021-09-08 LAB — BASIC METABOLIC PANEL
Anion gap: 4 — ABNORMAL LOW (ref 5–15)
BUN: 24 mg/dL — ABNORMAL HIGH (ref 6–20)
CO2: 30 mmol/L (ref 22–32)
Calcium: 7.8 mg/dL — ABNORMAL LOW (ref 8.9–10.3)
Chloride: 103 mmol/L (ref 98–111)
Creatinine, Ser: 0.44 mg/dL — ABNORMAL LOW (ref 0.61–1.24)
GFR, Estimated: >60 mL/min (ref >60)
Glucose, Bld: 83 mg/dL (ref 70–99)
Potassium: 3.8 mmol/L (ref 3.5–5.1)
Sodium: 137 mmol/L (ref 135–145)

## 2021-09-08 LAB — GLUCOSE, CAPILLARY
Glucose-Capillary: 105 mg/dL — ABNORMAL HIGH (ref 70–99)
Glucose-Capillary: 98 mg/dL (ref 70–99)

## 2021-09-08 MED ORDER — OLANZAPINE 10 MG PO TABS
10.0000 mg | ORAL_TABLET | Freq: Every day | ORAL | 0 refills | Status: DC
  Filled 2021-09-08: qty 30, 30d supply, fill #0

## 2021-09-08 MED ORDER — MIDODRINE HCL 5 MG PO TABS
5.0000 mg | ORAL_TABLET | Freq: Two times a day (BID) | ORAL | 0 refills | Status: DC
  Filled 2021-09-08: qty 60, 30d supply, fill #0

## 2021-09-08 MED ORDER — MIDODRINE HCL 5 MG PO TABS
5.0000 mg | ORAL_TABLET | Freq: Two times a day (BID) | ORAL | 0 refills | Status: AC
Start: 2021-09-08 — End: 2021-10-08

## 2021-09-08 MED ORDER — ENSURE ENLIVE PO LIQD
237.0000 mL | Freq: Three times a day (TID) | ORAL | 12 refills | Status: DC
Start: 2021-09-08 — End: 2021-09-08
  Filled 2021-09-08: qty 237, 1d supply, fill #0

## 2021-09-08 MED ORDER — JUVEN PO PACK
1.0000 | PACK | Freq: Two times a day (BID) | ORAL | 0 refills | Status: DC
  Filled 2021-09-08: qty 60, 30d supply, fill #0

## 2021-09-08 MED ORDER — OLANZAPINE 10 MG PO TABS: 10.0000 mg | ORAL_TABLET | Freq: Every day | ORAL | 0 refills | Status: DC

## 2021-09-08 MED ORDER — POLYETHYLENE GLYCOL 3350 17 GM/SCOOP PO POWD
17.0000 g | Freq: Every day | ORAL | 0 refills | Status: DC | PRN
  Filled 2021-09-08: qty 238, 14d supply, fill #0

## 2021-09-08 MED ORDER — FERROUS SULFATE 325 (65 FE) MG PO TABS
325.0000 mg | ORAL_TABLET | Freq: Two times a day (BID) | ORAL | 0 refills | Status: DC
  Filled 2021-09-08: qty 60, 30d supply, fill #0

## 2021-09-08 MED ORDER — JUVEN PO PACK: 1.0000 | PACK | Freq: Two times a day (BID) | ORAL | 0 refills | Status: AC

## 2021-09-08 MED ORDER — ENSURE ENLIVE PO LIQD: 237.0000 mL | Freq: Three times a day (TID) | ORAL | 12 refills | Status: DC

## 2021-09-08 NOTE — Plan of Care (Signed)
°  Problem: Education: Goal: Knowledge of General Education information will improve Description: Including pain rating scale, medication(s)/side effects and non-pharmacologic comfort measures Outcome: Progressing   Problem: Health Behavior/Discharge Planning: Goal: Ability to manage health-related needs will improve Outcome: Progressing   Problem: Clinical Measurements: Goal: Ability to maintain clinical measurements within normal limits will improve Outcome: Progressing   Haydee Salter, RN 09/08/21 1:14 PM

## 2021-09-08 NOTE — TOC Transition Note (Signed)
Transition of Care [facility]) - CM/SW Discharge Note  Patient Details  Name: Steve [name] MRN: [id number] Date of Birth: [birth year]  [facility] CM/SW Contact:  Ewing Schlein, LCSW Phone Number: 09/08/2021, 1:24 PM  Clinical Narrative: Patient will need [facility] for wound care. CSW made referral to [facility] with [facility] HH orders have been placed. Patient's [facility] social worker through the [place], Bo Merino [phone] ext. [phone]), requested the [facility] request be faxed to patient's PCP, Dr. Jerry Caras, at [phone]. Alcario Drought will also request that Dr. Adriana Simas refer patient to a wound care clinic at the [facility] CSW updated [facility] Grip[facility]. TOC signing off.  Final next level of care: Home w Home Health Services Barriers to Discharge: Barriers Resolved  Patient Goals and CMS Choice Patient states their goals for this hospitalization and ongoing recovery are:: Go home with [facility] for wound care CMS Medicare.gov Compare Post Acute Care list provided to:: Patient Choice offered to / list presented to : Patient  Discharge Plan and Services Discharge Planning Services: CM Consult      DME Arranged: N/A DME Agency: NA HH Arranged: RN HH Agency: [facility] Date HH Agency Contacted: 09/08/21 Time HH Agency Contacted: [id number] Representative spoke with at [facility] Agency: Pearson Grippe  Readmission Risk Interventions No flowsheet data found.

## 2021-09-08 NOTE — Discharge Summary (Addendum)
Physician Discharge Summary  Steve [name] [id number] DOB: [birth year] DOA: 08/29/2021  PCP: [facility]  Admit date: 08/29/2021 Discharge date: 09/08/2021  Admitted From: home with wife  Disposition:  same   Recommendations for Outpatient Follow-up:  Will need follow up of gangrenous toes (see below) Will need f/u for stage 4 sacral pressure ulcer- have requested HHRN to follow at home- recommend plastic surgery consult Midodrine should be weaned further  Home Health:  ordered  Discharge Condition:  stable   CODE STATUS:  full code   Diet recommendation:  regular diet Consultations: ID Psych  Critical care  Procedures/Studies: Intubation CVC   Discharge Diagnoses:  Principal Problem:   Sepsis due to Gram-negative organism with septic shock ([place]) Active Problems:   Acute metabolic encephalopathy Polysubstance abuse   Elevated CK due to Daptomycin   Acute lower UTI-secondary to Proteus   Lactic acidosis   Hypokalemia   Hypophosphatemia   Hypomagnesemia   Hyperbilirubinemia   Coagulopathy ([place])   COVID-19 virus infection   History of cervical spine trauma   Ischemic changes of bilateral lower extremity   Back pain   AKI (acute kidney injury) ([place])   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 EMS noted shallow breathing and  bagged patient. He had left AMA on 11/16. He was admitted on 11/9 and treated for MRSA bacteremia and osteomyelitis of the sacrum. His mother states he refused doctor's visits.   12/13-  Admitted by ICU team with fever 104, tachycardia, septic shock, started on pressors, had severe encephalopathy & intubated for airway protection, given broad spectrum antibiotics CVC placed by ICU team 12/14 Self extubated, very agitated and requiring restraints, remains on pressors, BLE mottled,  blood cultures 1/2 sets showing gram neg bacteria, treating with Meropenem -  Lucianne Lei stopped and started Dapto for prior h/o MRSA bacteriemia  12/15 Zyprexa increased, remains on 73mcg levo + vasopressin-  feet and legs noted to have dark purple areas, non-blanching  12/16 Improved mental status, on 3mcg levo, improved appearance of LE's, urine culture growing 30 K Proteus Mirabilis 12/17 CK rising with Dapto- given Oritavancin- does not need further treatment for MRSA 12/18 off of pressors 12/19 transferred to [facility] service, concern for HIT, hematology consulted.  Started on argatroban.  Psychiatry consulted for schizophrenia. ID recommends 7 days total of carbapenem 12/20 stopping fluids, and reducing steroids.   12/21 HITT ruled out- ok to use heparin products, stopped Meropenem 12/22- confused 12/23- alert oriented, eating, drinking well, dicussed dc home with wife      Hospital Course:  Sepsis due to Gram-negative organism with septic shock ([place])- (present on admission) - Blood culture positive for [facility] on 08/29/2021 -Given imipenem for total of 7 days as recommended by ID-stop date 09/06/2021 - Stress dose steroids have been  - Also on midodrine 5 mg 3 times daily which I will reduce  to 5 mg BID today and then PCP can continue to wean  Polysubstance abuse -UDS Benzo, Amphetamine and THC + - counseled not to use illegal drugs   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   - On admission, serum creatinine 1.95 and worsened to the point of 3.17.  -Has subsequently improving to normal.   Acute metabolic encephalopathy- (present on admission) Confused and agitated on admission.   Required medications while in ICU to control agitation Mentation improved with improvement in sepsis physiology  Now alert and oriented    Respiratory failure requiring intubation ([place])- (present on admission) Intubated on admission due to progressive altered mental status for airway protection. Extubated next day.    Ischemic changes of bilateral lower extremity Etiology unclear, differential include purpura fulminans in the setting of septic shock, ischemic changes in the setting of need for pressors, HIT including other conditions. -Changes have stabilized    COVID-19 virus infection- (present on admission) SARS COVID-positive on 12/13- asymptomatic and not treated   Coagulopathy ([place])- (present on admission) INR elevated to 1.5 on 12/15 and has subsequently normalized.     Hyperbilirubinemia- (present on admission) Elevated on admission due to septic shock   Hypomagnesemia- (present on admission) Replaced.   Hypophosphatemia- (present on admission) Replaced.   Hypokalemia- (present on admission) Potassium level low.  Replaced.   Lactic acidosis- (present on admission) Elevated on admission.  In the setting of severe septic shock.  Improving.   Acute lower UTI-secondary to Proteus- (present on admission) - 30,000 colonies of Proteus noted on urine culture on 12/13-pansensitive-treated x7 days   Elevated CK Elevated in  the setting of daptomycin use which was subsequently discontinued     Schizophrenia, chronic condition with acute exacerbation [facility])- (present on admission) Psychiatry consulted.  Diagnosis of paranoid schizophrenia but not on any medication. Psychiatry recommends to continue Zyprexa initiated by PCCM and refer outpatient psychiatry for medication management.    Acute thrombocytopenia ([place])-   HIT ruled out.  Also likely related to septic shock Platelet nadir was 34 on 12/16 and has subsequently improved to 145 No schistocytes on the smear.  DIC panel negative as well.   Appreciate hematology consultation     Malnutrition of moderate degree- (present on admission) Body mass index is 21.78 kg/m.  Nutrition Problem: Moderate Malnutrition Etiology: social / environmental circumstances Nutrition Interventions: Interventions: Juven, Ensure Enlive (each supplement provides 350kcal and 20 grams of protein)     Anemia in other chronic diseases classified elsewhere- (present on admission) Most likely anemia secondary to chronic disease from osteomyelitis. H&H relatively stable.    Back pain Was using as needed oxy IR.  Substance abuse ([place])- (present on admission) UDS positive for meth, benzodiazepine and THC on admission. Prior history of cocaine use as well. Placing the patient at high risk for poor outcome. Patient has tendency of leaving AMA once he feels medically improved.   Paraplegia ([place])- (present on admission) Prior history.      Decubitus ulcers- (present on admission) Continue dressing changes per wound care.      Pressure Injury 08/29/21 Ischial tuberosity Left Stage 4 -  Full thickness tissue loss with exposed bone, tendon or muscle. [id number] (Active)  08/29/21 0100  Location: Ischial tuberosity  Location Orientation: Left  Staging: Stage 4 - Full thickness tissue loss with exposed bone, tendon or muscle.  Wound Description (Comments): [id number]  Present on Admission: Yes      Pressure Injury 09/01/21 Sacrum Mid Deep Tissue Pressure Injury - Purple or maroon localized area of discolored intact skin or blood-filled blister due to damage of underlying soft tissue from pressure and/or shear. small dark area in the middle of the s (Active)  09/01/21 0900  Location: Sacrum  Location Orientation: Mid  Staging: Deep Tissue Pressure Injury - Purple or maroon localized area of discolored intact skin or blood-filled blister due to damage of underlying soft tissue from pressure and/or shear.  Wound Description (Comments): small dark area in the middle of the sacrum  Present on Admission:     Pressure Injury 05/30/21 Elbow Left;Posterior Stage 2 -  Partial thickness loss of dermis presenting as a shallow open injury with a red, pink wound bed without slough. (Active)  05/30/21 1935  Location: Elbow  Location Orientation: Left;Posterior  Staging: Stage 2 -  Partial thickness loss of dermis presenting as a shallow open injury with a red, pink wound bed without slough.  Wound Description (Comments):   Present on Admission: Yes     Pressure Injury 05/30/21 Heel Left Deep Tissue Pressure Injury - Purple or maroon localized area of discolored intact skin or blood-filled blister due to damage of underlying soft tissue from pressure and/or shear. (Active)  05/30/21 1935  Location: Heel  Location Orientation: Left  Staging: Deep Tissue Pressure Injury - Purple or maroon localized area of discolored intact skin or blood-filled blister due to damage of underlying soft tissue from pressure and/or shear.  Wound Description (Comments):   Present on Admission: Yes     Pressure Injury 05/30/21 Back Right;Mid Stage 2 -  Partial thickness loss of dermis presenting as a shallow open injury with a red, pink wound bed without slough. (Active)  05/30/21 1936  Location: Back  Location Orientation: Right;Mid  Staging: Stage 2 -  Partial thickness loss of dermis presenting as a shallow open  injury with a red, pink wound bed without slough.  Wound Description (Comments):   Present on Admission: Yes     Pressure Injury 05/30/21 Back Lateral;Right Stage 2 -  Partial thickness loss of dermis presenting as a shallow open injury with a red, pink wound bed without slough. (Active)  05/30/21 1937  Location: Back  Location Orientation: Lateral;Right  Staging: Stage 2 -  Partial thickness loss of dermis presenting as a shallow open injury with a red, pink wound bed without slough.  Wound Description (Comments):   Present on Admission: Yes     Pressure Injury 05/30/21 Ankle Left;Lateral Stage 3 -  Full thickness tissue loss. Subcutaneous fat may be visible but bone, tendon or muscle are NOT exposed. (Active)  05/30/21 1938  Location: Ankle  Location Orientation: Left;Lateral  Staging: Stage 3 -  Full thickness tissue loss. Subcutaneous fat may be visible but bone, tendon or muscle are NOT exposed.  Wound Description (Comments):   Present on Admission: Yes     Pressure Injury 05/30/21 Ankle Right;Medial Stage 2 -  Partial thickness loss of dermis presenting as a shallow open injury with a red, pink wound bed without slough. (Active)  05/30/21 1939  Location: Ankle  Location Orientation: Right;Medial  Staging: Stage 2 -  Partial  thickness loss of dermis presenting as a shallow open injury with a red, pink wound bed without slough.  Wound Description (Comments):   Present on Admission: Yes     Pressure Injury 05/30/21 Hip Left Stage 4 - Full thickness tissue loss with exposed bone, tendon or muscle. (Active)  05/30/21 1944  Location: Hip  Location Orientation: Left  Staging: Stage 4 - Full thickness tissue loss with exposed bone, tendon or muscle.  Wound Description (Comments):   Present on Admission: Yes     Pressure Injury 08/29/21 Ischial tuberosity Left Stage 4 - Full thickness tissue loss with exposed bone, tendon or muscle. [id number] (Active)  08/29/21 0100  Location: Ischial  tuberosity  Location Orientation: Left  Staging: Stage 4 - Full thickness tissue loss with exposed bone, tendon or muscle.  Wound Description (Comments): [id number]  Present on Admission: Yes     Pressure Injury 09/01/21 Sacrum Mid Deep Tissue Pressure Injury - Purple or maroon localized area of discolored intact skin or blood-filled blister due to damage of underlying soft tissue from pressure and/or shear. small dark area in the middle of the s (Active)  09/01/21 0900  Location: Sacrum  Location Orientation: Mid  Staging: Deep Tissue Pressure Injury - Purple or maroon localized area of discolored intact skin or blood-filled blister due to damage of underlying soft tissue from pressure and/or shear.  Wound Description (Comments): small dark area in the middle of the sacrum  Present on Admission:        Discharge Exam: Vitals:   09/07/21 2135 09/08/21 0608  BP: 122/76 (!) 113/58  Pulse: 83 61  Resp: 18 16  Temp: 97.9 F (36.6 C) 98.3 F (36.8 C)  SpO2: 99%    Vitals:   09/07/21 0926 09/07/21 1340 09/07/21 2135 09/08/21 0608  BP: (!) 95/44 (!) 104/56 122/76 (!) 113/58  Pulse: 90 86 83 61  Resp: 18 14 18 16   Temp: 98.2 F (36.8 C) 98.5 F (36.9 C) 97.9 F (36.6 C) 98.3 F (36.8 C)  TempSrc: Oral Oral Oral Oral  SpO2: 99% 99% 99%   Weight:      Height:        General: Pt is alert, awake, not in acute distress Cardiovascular: RRR, S1/S2 +, no rubs, no gallops Respiratory: CTA bilaterally, no wheezing, no rhonchi Abdominal: Soft, NT, ND, bowel sounds +                Discharge Instructions  Discharge Instructions     Discharge wound care:   Complete by: As directed    Place saline moistened gauze into the right ischial wound. Cover with ABD pad, tape in place. Use a foam dressing to cover the right mid-back superficial wound. Change the dressing every 3 days and prn.      Allergies as of 09/08/2021       Reactions   Caffeine Anaphylaxis   Caffeine  Anaphylaxis   Chocolate Anaphylaxis   Chocolate Anaphylaxis   Tuberculin, Ppd    Other reaction(s): Eruption of skin   Sulfa Antibiotics Rash   Sulfa Antibiotics Rash        Medication List     STOP taking these medications    bacitracin ointment   docusate sodium 100 MG capsule Commonly known as: COLACE   linezolid 600 MG tablet Commonly known as: ZYVOX   mupirocin ointment 2 % Commonly known as: BACTROBAN       TAKE these medications    feeding supplement Liqd Take  237 mLs by mouth 3 (three) times daily between meals.   nutrition supplement (JUVEN) Pack Take 1 packet by mouth 2 (two) times daily between meals.   ferrous sulfate 325 (65 FE) MG tablet Take 1 tablet (325 mg total) by mouth 2 (two) times daily with a meal.   midodrine 5 MG tablet Commonly known as: PROAMATINE Take 1 tablet (5 mg total) by mouth 2 (two) times daily with a meal.   OLANZapine zydis 10 MG disintegrating tablet Commonly known as: ZYPREXA Take 1 tablet (10 mg total) by mouth at bedtime.   polyethylene glycol 17 g packet Commonly known as: MIRALAX / GLYCOLAX Take 17 g by mouth daily as needed.               Discharge Care Instructions  (From admission, onward)           Start     Ordered   09/08/21 0000  Discharge wound care:       Comments: Place saline moistened gauze into the right ischial wound. Cover with ABD pad, tape in place. Use a foam dressing to cover the right mid-back superficial wound. Change the dressing every 3 days and prn.   09/08/21 1243            Allergies  Allergen Reactions   Caffeine Anaphylaxis   Caffeine Anaphylaxis   Chocolate Anaphylaxis   Chocolate Anaphylaxis   Tuberculin, Ppd     Other reaction(s): Eruption of skin   Sulfa Antibiotics Rash   Sulfa Antibiotics Rash      US RENAL  Result Date: 08/30/2021 CLINICAL DATA:  Acute kidney injury EXAM: RENAL / URINARY TRACT ULTRASOUND COMPLETE COMPARISON:  None. FINDINGS:  Right Kidney: Renal measurements: 12.2 x 5.7 x 7.1 cm = volume: 259.7 mL. Echogenicity within normal limits. No mass or hydronephrosis visualized. Left Kidney: Renal measurements: 12.4 x 7.1 x 7.6 cm = volume: 353.3 mL. Echogenicity within normal limits. No mass or hydronephrosis visualized. Bladder: Partially decompressed.  Foley catheter is present. Other: None. IMPRESSION: No hydronephrosis. Electronically Signed   By: Macy Mis M.D.   On: 08/30/2021 13:51   DG Chest Portable 1 View  Result Date: 08/29/2021 CLINICAL DATA:  Central line placement. EXAM: PORTABLE CHEST 1 VIEW COMPARISON:  08/29/2021, earlier same day FINDINGS: 1546 hours. Left IJ central line tip overlies the upper right atrium. Another possible catheter overlies the left axillary region with the tip just lateral to the left second rib. No pneumothorax or pleural effusion. The cardiopericardial silhouette is within normal limits for size. The visualized bony structures of the thorax show no acute abnormality. Telemetry leads overlie the chest. IMPRESSION: Left IJ central line tip overlies the upper right atrium. No pneumothorax or pleural effusion. Electronically Signed   By: Misty Stanley M.D.   On: 08/29/2021 15:53   DG Chest Port 1 View  Result Date: 08/29/2021 CLINICAL DATA:  30 year old male with sepsis. Low oxygen saturations. EXAM: PORTABLE CHEST 1 VIEW COMPARISON:  Chest x-ray 07/26/2021. FINDINGS: Lung volumes are low. No consolidative airspace disease. No pleural effusions. No pneumothorax. No pulmonary nodule or mass noted. Pulmonary vasculature and the cardiomediastinal silhouette are within normal limits. IMPRESSION: 1. Low lung volumes without radiographic evidence of acute cardiopulmonary disease. Electronically Signed   By: Vinnie Langton M.D.   On: 08/29/2021 10:51   ECHOCARDIOGRAM LIMITED  Result Date: 08/30/2021    ECHOCARDIOGRAM REPORT   Patient Name:   Steve [name] [facility] Date of Exam: 08/30/2021 Medical Rec  #:  [id number]       Height:       69.0 in Accession #:    [id number]      Weight:       135.0 lb Date of Birth:  [birth year]       BSA:          1.748 m Patient Age:    30 years        BP:           92/57 mmHg Patient Gender: M               HR:           104 bpm. Exam Location:  Inpatient Procedure: Limited Echo, Color Doppler and Cardiac Doppler STAT ECHO                                 MODIFIED REPORT:   This report was modified by Rudean Haskell MD on 08/30/2021 due to MD                                  notification.  Indications:     Endocarditis  History:         Patient has prior history of Echocardiogram examinations, most                  recent 08/01/2021. Risk Factors:Polysubstance abuse.  Sonographer:     Raquel Sarna Senior RDCS Referring Phys:  [id number] Candee Furbish Diagnosing Phys: Rudean Haskell MD  Sonographer Comments: Scanned upright on artificial respirator. IMPRESSIONS  1. Left ventricular ejection fraction, by estimation, is 25 to 30%. The left ventricle has severely decreased function. The left ventricle demonstrates global hypokinesis. Left ventricular diastolic function could not be evaluated.  2. Right ventricular systolic function is mildly reduced. The right ventricular size is normal. There is normal pulmonary artery systolic pressure.  3. The mitral valve is grossly normal. No evidence of mitral valve regurgitation.  4. The aortic valve was not well visualized. Aortic valve regurgitation is not visualized. Comparison(s): A prior study was performed on 08/01/21. Prior images reviewed side by side. LVEF is markedly reduced. Reaching out to primary MD. FINDINGS  Left Ventricle: Left ventricular ejection fraction, by estimation, is 25 to 30%. The left ventricle has severely decreased function. The left ventricle demonstrates global hypokinesis. The left ventricular internal cavity size was normal in size. There is no left ventricular hypertrophy. Left ventricular diastolic function  could not be evaluated. Right Ventricle: The right ventricular size is normal. No increase in right ventricular wall thickness. Right ventricular systolic function is mildly reduced. There is normal pulmonary artery systolic pressure. The tricuspid regurgitant velocity is 1.85 m/s, and with an assumed right atrial pressure of 8 mmHg, the estimated right ventricular systolic pressure is 0000000 mmHg. Left Atrium: Left atrial size was normal in size. Right Atrium: Right atrial size was normal in size. Pericardium: There is no evidence of pericardial effusion. Mitral Valve: The mitral valve is grossly normal. There is mild thickening of the mitral valve leaflet(s). No evidence of mitral valve regurgitation. Tricuspid Valve: The tricuspid valve is normal in structure. Tricuspid valve regurgitation is mild. Aortic Valve: The aortic valve was not well visualized. Aortic valve regurgitation is not visualized. Pulmonic Valve: The pulmonic valve was not well visualized. Pulmonic valve regurgitation is not visualized. Aorta: The aortic  root is normal in size and structure. IAS/Shunts: The interatrial septum was not assessed.   LV Volumes (MOD) LV vol d, MOD A2C: 99.0 ml LV vol d, MOD A4C: 114.0 ml LV vol s, MOD A2C: 70.3 ml LV vol s, MOD A4C: 66.0 ml LV SV MOD A2C:     28.7 ml LV SV MOD A4C:     114.0 ml LV SV MOD BP:      37.0 ml TRICUSPID VALVE TR Peak grad:   13.7 mmHg TR Vmax:        185.00 cm/s Rudean Haskell MD Electronically signed by Rudean Haskell MD Signature Date/Time: 08/30/2021/9:18:25 AM    Final (Updated)      The results of significant diagnostics from this hospitalization (including imaging, microbiology, ancillary and laboratory) are listed below for reference.     Microbiology: Recent Results (from the past 240 hour(s))  MRSA Next Gen by PCR, Nasal     Status: Abnormal   Collection Time: 08/30/21 11:58 AM   Specimen: Nasal Mucosa; Nasal Swab  Result Value Ref Range Status   MRSA by PCR  Next Gen DETECTED (A) NOT DETECTED Final    Comment: RESULT CALLED TO, READ BACK BY AND VERIFIED WITH: SCOTT,B. RN AT 1401 08/30/21 MULLINS,T (NOTE) The GeneXpert MRSA Assay (FDA approved for NASAL specimens only), is one component of a comprehensive MRSA colonization surveillance program. It is not intended to diagnose MRSA infection nor to guide or monitor treatment for MRSA infections. Test performance is not FDA approved in patients less than 73 years old. Performed at [facility], [address], [place], [place] [address]      Labs:  Basic Metabolic Panel: Recent Labs  Lab 09/02/21 0320 09/03/21 0409 09/04/21 0817 09/05/21 0402 09/06/21 0335 09/07/21 0323 09/08/21 0349  NA 136 139 136 137 136 136 137  K 3.0* 3.8 3.7 3.9 3.8 3.5 3.8  CL 104 105 105 103 101 104 103  CO2 26 26 27 29 30 29 30   GLUCOSE 108* 98 145* 107* 105* 122* 83  BUN 26* 25* 26* 26* 26* 24* 24*  CREATININE 0.74 0.69 0.52* 0.48* 0.53* 0.49* 0.44*  CALCIUM 8.4* 8.4* 8.0* 8.1* 8.0* 7.6* 7.8*  MG 2.4 2.0 1.6* 1.8  --   --   --   PHOS 2.5 1.8* 2.1*  --   --   --   --    Liver Function Tests: Recent Labs  Lab 09/04/21 0817  AST 28  ALT 41  ALKPHOS 176*  BILITOT 0.7  PROT 5.7*  ALBUMIN 2.1*    CBC: Recent Labs  Lab 09/03/21 0409 09/04/21 0817 09/05/21 0402 09/06/21 0335 09/07/21 0323  WBC 14.1* 9.7 9.0 8.2 5.3  NEUTROABS  --  7.8* 7.0 6.2 3.2  HGB 8.8* 9.1* 8.9* 9.2* 8.9*  HCT 27.6* 29.1* 28.1* 29.5* 28.0*  MCV 86.5 87.1 86.7 86.3 87.5  PLT 34* 59* 89* 127* 145*     Time coordinating discharge in minutes: 65  SIGNED:   Debbe Odea, MD  [facility] 09/08/2021, 12:45 PM

## 2021-09-08 NOTE — Plan of Care (Signed)
Patient discharged home with wife. Haydee Salter, RN 09/08/21 3:35 PM

## 2022-01-02 ENCOUNTER — Other Ambulatory Visit: Payer: Self-pay

## 2022-01-02 ENCOUNTER — Inpatient Hospital Stay (HOSPITAL_COMMUNITY)
Admission: EM | Admit: 2022-01-02 | Discharge: 2022-01-05 | DRG: 592 | Disposition: A | Discharge status: Home Health | Payer: No Typology Code available for payment source | Attending: Internal Medicine | Admitting: Internal Medicine

## 2022-01-02 ENCOUNTER — Encounter (HOSPITAL_COMMUNITY): Payer: Self-pay | Admitting: Emergency Medicine

## 2022-01-02 ENCOUNTER — Emergency Department (HOSPITAL_COMMUNITY): Payer: No Typology Code available for payment source

## 2022-01-02 DIAGNOSIS — Z91018 Allergy to other foods: Secondary | ICD-10-CM

## 2022-01-02 DIAGNOSIS — M8668 Other chronic osteomyelitis, other site: Secondary | ICD-10-CM | POA: Diagnosis present

## 2022-01-02 DIAGNOSIS — L89626 Pressure-induced deep tissue damage of left heel: Secondary | ICD-10-CM | POA: Diagnosis present

## 2022-01-02 DIAGNOSIS — Z882 Allergy status to sulfonamides status: Secondary | ICD-10-CM

## 2022-01-02 DIAGNOSIS — Z8659 Personal history of other mental and behavioral disorders: Secondary | ICD-10-CM

## 2022-01-02 DIAGNOSIS — F200 Paranoid schizophrenia: Secondary | ICD-10-CM | POA: Diagnosis present

## 2022-01-02 DIAGNOSIS — M86659 Other chronic osteomyelitis, unspecified thigh: Secondary | ICD-10-CM | POA: Diagnosis not present

## 2022-01-02 DIAGNOSIS — L8944 Pressure ulcer of contiguous site of back, buttock and hip, stage 4: Secondary | ICD-10-CM

## 2022-01-02 DIAGNOSIS — F1510 Other stimulant abuse, uncomplicated: Secondary | ICD-10-CM | POA: Diagnosis present

## 2022-01-02 DIAGNOSIS — F39 Unspecified mood [affective] disorder: Secondary | ICD-10-CM | POA: Diagnosis not present

## 2022-01-02 DIAGNOSIS — F17210 Nicotine dependence, cigarettes, uncomplicated: Secondary | ICD-10-CM | POA: Diagnosis present

## 2022-01-02 DIAGNOSIS — D649 Anemia, unspecified: Secondary | ICD-10-CM | POA: Diagnosis present

## 2022-01-02 DIAGNOSIS — L089 Local infection of the skin and subcutaneous tissue, unspecified: Secondary | ICD-10-CM

## 2022-01-02 DIAGNOSIS — F419 Anxiety disorder, unspecified: Secondary | ICD-10-CM | POA: Diagnosis not present

## 2022-01-02 DIAGNOSIS — F4310 Post-traumatic stress disorder, unspecified: Secondary | ICD-10-CM | POA: Diagnosis present

## 2022-01-02 DIAGNOSIS — G8220 Paraplegia, unspecified: Secondary | ICD-10-CM | POA: Diagnosis not present

## 2022-01-02 DIAGNOSIS — Z818 Family history of other mental and behavioral disorders: Secondary | ICD-10-CM | POA: Diagnosis not present

## 2022-01-02 DIAGNOSIS — F1990 Other psychoactive substance use, unspecified, uncomplicated: Secondary | ICD-10-CM

## 2022-01-02 DIAGNOSIS — G8250 Quadriplegia, unspecified: Secondary | ICD-10-CM | POA: Diagnosis present

## 2022-01-02 DIAGNOSIS — M62830 Muscle spasm of back: Secondary | ICD-10-CM | POA: Diagnosis present

## 2022-01-02 DIAGNOSIS — L89154 Pressure ulcer of sacral region, stage 4: Secondary | ICD-10-CM | POA: Diagnosis present

## 2022-01-02 DIAGNOSIS — Z79899 Other long term (current) drug therapy: Secondary | ICD-10-CM | POA: Diagnosis not present

## 2022-01-02 DIAGNOSIS — S14109D Unspecified injury at unspecified level of cervical spinal cord, subsequent encounter: Secondary | ICD-10-CM | POA: Diagnosis not present

## 2022-01-02 DIAGNOSIS — F32A Depression, unspecified: Secondary | ICD-10-CM | POA: Diagnosis not present

## 2022-01-02 DIAGNOSIS — Z888 Allergy status to other drugs, medicaments and biological substances status: Secondary | ICD-10-CM | POA: Diagnosis not present

## 2022-01-02 DIAGNOSIS — R4588 Nonsuicidal self-harm: Secondary | ICD-10-CM | POA: Diagnosis not present

## 2022-01-02 DIAGNOSIS — I959 Hypotension, unspecified: Secondary | ICD-10-CM | POA: Diagnosis present

## 2022-01-02 DIAGNOSIS — L8995 Pressure ulcer of unspecified site, unstageable: Secondary | ICD-10-CM

## 2022-01-02 DIAGNOSIS — L89224 Pressure ulcer of left hip, stage 4: Secondary | ICD-10-CM | POA: Diagnosis not present

## 2022-01-02 DIAGNOSIS — L8990 Pressure ulcer of unspecified site, unspecified stage: Secondary | ICD-10-CM

## 2022-01-02 DIAGNOSIS — S14109A Unspecified injury at unspecified level of cervical spinal cord, initial encounter: Secondary | ICD-10-CM

## 2022-01-02 LAB — RAPID URINE DRUG SCREEN, HOSP PERFORMED
Amphetamines: POSITIVE — AB
Barbiturates: NOT DETECTED
Benzodiazepines: NOT DETECTED
Cocaine: NOT DETECTED
Opiates: NOT DETECTED
Tetrahydrocannabinol: NOT DETECTED

## 2022-01-02 LAB — LACTIC ACID, PLASMA: Lactic Acid, Venous: 1.1 mmol/L (ref 0.5–1.9)

## 2022-01-02 LAB — COMPREHENSIVE METABOLIC PANEL
ALT: 14 U/L (ref 0–44)
AST: 25 U/L (ref 15–41)
Albumin: 3.2 g/dL — ABNORMAL LOW (ref 3.5–5.0)
Alkaline Phosphatase: 64 U/L (ref 38–126)
Anion gap: 5 (ref 5–15)
BUN: 19 mg/dL (ref 6–20)
CO2: 24 mmol/L (ref 22–32)
Calcium: 8.8 mg/dL — ABNORMAL LOW (ref 8.9–10.3)
Chloride: 106 mmol/L (ref 98–111)
Creatinine, Ser: 0.68 mg/dL (ref 0.61–1.24)
GFR, Estimated: >60 mL/min (ref >60)
Glucose, Bld: 91 mg/dL (ref 70–99)
Potassium: 4.6 mmol/L (ref 3.5–5.1)
Sodium: 135 mmol/L (ref 135–145)
Total Bilirubin: 0.7 mg/dL (ref 0.3–1.2)
Total Protein: 7.5 g/dL (ref 6.5–8.1)

## 2022-01-02 LAB — URINALYSIS, ROUTINE W REFLEX MICROSCOPIC
Bacteria, UA: NONE SEEN
Bilirubin Urine: NEGATIVE
Glucose, UA: NEGATIVE mg/dL
Ketones, ur: NEGATIVE mg/dL
Nitrite: NEGATIVE
Protein, ur: 30 mg/dL — AB
RBC / HPF: >50 RBC/hpf — ABNORMAL HIGH (ref 0–5)
Specific Gravity, Urine: 1.025 (ref 1.005–1.030)
pH: 5 (ref 5.0–8.0)

## 2022-01-02 LAB — CBC WITH DIFFERENTIAL/PLATELET
Abs Immature Granulocytes: 0.03 10*3/uL (ref 0.00–0.07)
Basophils Absolute: 0 10*3/uL (ref 0.0–0.1)
Basophils Relative: 1 %
Eosinophils Absolute: 0.4 10*3/uL (ref 0.0–0.5)
Eosinophils Relative: 5 %
HCT: 29.8 % — ABNORMAL LOW (ref 39.0–52.0)
Hemoglobin: 9.1 g/dL — ABNORMAL LOW (ref 13.0–17.0)
Immature Granulocytes: 0 %
Lymphocytes Relative: 19 %
Lymphs Abs: 1.4 10*3/uL (ref 0.7–4.0)
MCH: 26.5 pg (ref 26.0–34.0)
MCHC: 30.5 g/dL (ref 30.0–36.0)
MCV: 86.6 fL (ref 80.0–100.0)
Monocytes Absolute: 0.5 10*3/uL (ref 0.1–1.0)
Monocytes Relative: 7 %
Neutro Abs: 4.8 10*3/uL (ref 1.7–7.7)
Neutrophils Relative %: 68 %
Platelets: 248 10*3/uL (ref 150–400)
RBC: 3.44 MIL/uL — ABNORMAL LOW (ref 4.22–5.81)
RDW: 16 % — ABNORMAL HIGH (ref 11.5–15.5)
WBC: 7.1 10*3/uL (ref 4.0–10.5)
nRBC: 0 % (ref 0.0–0.2)

## 2022-01-02 LAB — PROTIME-INR
INR: 1.1 (ref 0.8–1.2)
Prothrombin Time: 13.9 seconds (ref 11.4–15.2)

## 2022-01-02 LAB — APTT: aPTT: 38 seconds — ABNORMAL HIGH (ref 24–36)

## 2022-01-02 LAB — SEDIMENTATION RATE: Sed Rate: 72 mm/hr — ABNORMAL HIGH (ref 0–16)

## 2022-01-02 LAB — C-REACTIVE PROTEIN: CRP: 3.4 mg/dL — ABNORMAL HIGH (ref <1.0)

## 2022-01-02 MED ORDER — ACETAMINOPHEN 325 MG PO TABS
650.0000 mg | ORAL_TABLET | Freq: Four times a day (QID) | ORAL | Status: DC | PRN
  Administered 2022-01-03 – 2022-01-05 (×2): 650 mg via ORAL
  Filled 2022-01-02 (×3): qty 2

## 2022-01-02 MED ORDER — VANCOMYCIN HCL IN DEXTROSE 1-5 GM/200ML-% IV SOLN
1000.0000 mg | Freq: Once | INTRAVENOUS | Status: AC
  Administered 2022-01-02: 1000 mg via INTRAVENOUS
  Filled 2022-01-02: qty 200

## 2022-01-02 MED ORDER — SENNOSIDES-DOCUSATE SODIUM 8.6-50 MG PO TABS
1.0000 | ORAL_TABLET | Freq: Two times a day (BID) | ORAL | Status: DC | PRN
Start: 2022-01-02 — End: 2022-01-05

## 2022-01-02 MED ORDER — ONDANSETRON HCL 4 MG PO TABS: 4.0000 mg | ORAL_TABLET | Freq: Four times a day (QID) | ORAL | Status: DC | PRN

## 2022-01-02 MED ORDER — IOHEXOL 300 MG/ML  SOLN
100.0000 mL | Freq: Once | INTRAMUSCULAR | Status: AC | PRN
  Administered 2022-01-02: 100 mL via INTRAVENOUS

## 2022-01-02 MED ORDER — ENOXAPARIN SODIUM 40 MG/0.4ML IJ SOSY
40.0000 mg | PREFILLED_SYRINGE | INTRAMUSCULAR | Status: DC
  Administered 2022-01-02 – 2022-01-04 (×3): 40 mg via SUBCUTANEOUS
  Filled 2022-01-02 (×3): qty 0.4

## 2022-01-02 MED ORDER — ONDANSETRON HCL 4 MG/2ML IJ SOLN
4.0000 mg | Freq: Four times a day (QID) | INTRAMUSCULAR | Status: DC | PRN
Start: 2022-01-02 — End: 2022-01-05

## 2022-01-02 MED ORDER — POLYETHYLENE GLYCOL 3350 17 G PO PACK
17.0000 g | PACK | Freq: Two times a day (BID) | ORAL | Status: DC | PRN
Start: 2022-01-02 — End: 2022-01-05

## 2022-01-02 MED ORDER — BACLOFEN 10 MG PO TABS
10.0000 mg | ORAL_TABLET | Freq: Two times a day (BID) | ORAL | Status: DC | PRN
  Administered 2022-01-03 – 2022-01-04 (×2): 10 mg via ORAL
  Filled 2022-01-02 (×2): qty 1

## 2022-01-02 MED ORDER — VANCOMYCIN HCL IN DEXTROSE 1-5 GM/200ML-% IV SOLN
1000.0000 mg | Freq: Two times a day (BID) | INTRAVENOUS | Status: DC
  Administered 2022-01-03: 1000 mg via INTRAVENOUS
  Filled 2022-01-02: qty 200

## 2022-01-02 MED ORDER — PIPERACILLIN-TAZOBACTAM 3.375 G IVPB 30 MIN
3.3750 g | Freq: Once | INTRAVENOUS | Status: AC
  Administered 2022-01-02: 3.375 g via INTRAVENOUS
  Filled 2022-01-02: qty 50

## 2022-01-02 MED ORDER — ACETAMINOPHEN 650 MG RE SUPP: 650.0000 mg | Freq: Four times a day (QID) | RECTAL | Status: DC | PRN

## 2022-01-02 NOTE — ED Triage Notes (Signed)
Patient BIBA c/o pressure ulcer on his buttock. He is paraplegic from prior cervical spine trauma. Ulcer on left buttock, pressure bandage applied by his wife. Hx schizophrenia and PTSD. ? ? ?BP 98/72 ?99% RA ?HR 80 ?

## 2022-01-02 NOTE — Assessment & Plan Note (Addendum)
-   Supportive care. ?-Tylenol and baclofen as needed for pain ?

## 2022-01-02 NOTE — Progress Notes (Signed)
A consult was received from an ED physician for vancomycin + piperacillin/tazobactam per pharmacy dosing.  The patient's profile has been reviewed for ht/wt/allergies/indication/available labs.   ? ?No weight documented. STAT ht/wt order entered. Will have to order vancomycin 1000 mg IV dose instead of weight based load. ? ?A one time order has been placed for vancomycin 1000 mg IV + piperacillin/tazobactam 3.375 g IV once to be infused over 30 mins.   ? ?Further antibiotics/pharmacy consults should be ordered by admitting physician if indicated.       ?                ?Thank you, ?Cindi Carbon, PharmD ?01/02/2022  4:50 PM ? ?

## 2022-01-02 NOTE — Progress Notes (Signed)
Pharmacy Antibiotic Note ? ?Steve [name] is a 31 y.o. male admitted on 01/02/2022 with  decubitus ulcer . PMH significant for unstageable wound and chronic pelvic OM.  Pharmacy has been consulted for vancomycin dosing. ID to be consulted.  ? ?Today, 01/02/22 ?-WBC WNL ?-SCr 0.68, will round to 1 for dose calculations ?-Pt/family reported patients weight to RN. TBW reported as 145 lbs (~66 kg). TBW < IBW ? ?Plan: ?Vancomycin 1000 mg IV once followed by 1000 mg IV q12h for estimated AUC of 482 ?Monitor renal function. Goal vancomycin AUC 400-550. Check levels at steady state as needed.  ? ?  ? ?Temp (24hrs), Avg:97.9 ?F (36.6 ?C), Min:97.8 ?F (36.6 ?C), Max:98 ?F (36.7 ?C) ? ?Recent Labs  ?Lab 01/02/22 ?1356  ?WBC 7.1  ?CREATININE 0.68  ?LATICACIDVEN 1.1  ?  ?CrCl cannot be calculated (Unknown ideal weight.).   ? ?Allergies  ?Allergen Reactions  ? Caffeine Anaphylaxis  ? Caffeine Anaphylaxis  ? Chocolate Anaphylaxis  ? Chocolate Anaphylaxis  ? Tuberculin, Ppd   ?  Other reaction(s): Eruption of skin  ? Sulfa Antibiotics Rash  ? Sulfa Antibiotics Rash  ? ? ?Antimicrobials this admission: ?vancomycin 4/18 >>  ?Zosyn x1 dose in ED on 4/18 ? ?Dose adjustments this admission: ? ?Microbiology results: ?4/18 BCx:  ?4/18 UCx:   ? ?Lenis Noon, PharmD ?01/02/2022 6:43 PM ?

## 2022-01-02 NOTE — Assessment & Plan Note (Addendum)
Admits to using meth, confirmed on UDS ?-Will need cessation ?

## 2022-01-02 NOTE — Assessment & Plan Note (Signed)
He has what looks like stage IV sacral decubitus, stage II bilateral lower thigh wounds and unstageable right heel wound ?-Consulted WOCN. ?

## 2022-01-02 NOTE — Assessment & Plan Note (Addendum)
-  Physical deconditioning previously completed treatment in 08/2021.  Wound appears clean.  However, CT pelvis raises concern for cellulitis and worsening chronic osteomyelitis.   ?-Discussed with ID. Likely chronic decub with chronic osteo, no evidence of abscess or soft tissue infection. ID recs to watch off abx  ?-Cultures have remained neg x 3 days. Afebrile and no leukocytosis ?

## 2022-01-02 NOTE — Hospital Course (Signed)
31 year old M with PMH of C-spine injury with dysplasia and decubitus ulcer, anxiety, depression, PTSD, schizophrenia, polysubstance use and MRSA bacteremia with pelvic osteomyelitis for which he completed antibiotic course in 08/2021 brought to ED by EMS due to "back spasms, ulcers on my bottom, and my wife called paramedics when we got into argument". ? ?Patient is awake and oriented x4 but not a great historian.  No family member at bedside during my visit.  Attempted to call his mother who was not listed in his chart but no answer. ? ?Patient states spasm in his lower back.  He also reports worsening of the wound on his bottom.  He thinks there is some drainage from his bottom.  He says his wife help with wound care.  He states he got into argument with his wife and his wife called paramedics.  He denies physical altercation.  He is asking if someone can take him to the [place].  He denies other complaints but responds yes to fever and chills on review of system.  He states he did not check his temperature.  He also responds yes to nausea and emesis.  He reports 2 episodes today.  Emesis was not large.  He say he swallowed it.  He responds yes to cough.  He denies shortness of breath, abdominal pain or UTI symptoms.  He says he is not taking any medication on a regular basis.  He says he no longer takes olanzapine. ? ?Patient admits to smoking about 5 cigarettes a day.  He denies drinking alcohol.  He admits to using methamphetamine.  He prefers to remain full code. ? ?In ED, hypotensive to 79/58 on arrival but improved without IV fluid.  He was not febrile.  CBC and CMP without significant finding other than Hgb of 9.1.  Lactic acid 1.1.  Coag labs within normal.  UA not impressive.  CT pelvis with contrast concerning for cellulitis/decubitus ulcer infection, worsening chronic pelvic osteomyelitis and reactive bilateral inguinal LAD.  Cultures obtained.  Patient was started on Zosyn and vancomycin and hospitalist  service called for admission. ?

## 2022-01-02 NOTE — Assessment & Plan Note (Addendum)
Stable.  He says he no longer takes olanzapine. ?-Reported to nursing staff issues relating to depression and even self-harm in the past ?-Consulted Psychiatry. Pt has been cleared by psych for discharge ?-Of note, patient was very keen on a transfer to [facility] [place] in [place]. When asked, pt admitted [facility] thus he would have a place to stay in that regard ?-Pt has also told [facility] that [facility] by ways of "you just go up there and say you are suicidal" ?-this AM, pt denied any active suicidal ideations ?-Psych recommends d/c on olanzapine 5mg  qhs ?

## 2022-01-02 NOTE — ED Provider Notes (Signed)
?[facility] DEPT ?Provider Note ? ? ?CSN: [id number] ?Arrival date & time: 01/02/22  1308 ? ?  ? ?History ? ?Chief Complaint  ?Patient presents with  ? Wound Check  ? ? ?KALONJI TOMASSETTI is a 31 y.o. male. ? ?HPI ?31 year old male with a history of paraplegia presents with buttock wounds.  Patient is an overall poor historian.  He states that the wounds just popped up on his left side and right side and have been causing some bleeding over the last couple days.  He is also felt some on and off chills for a while, hard to pin down exactly how long but at least a week.  He states that they are a little uncomfortable but no severe pain. ? ?Home Medications ?Prior to Admission medications   ?Medication Sig Start Date End Date Taking? Authorizing Provider  ?feeding supplement (ENSURE ENLIVE / ENSURE PLUS) LIQD Take 237 mLs by mouth 3 (three) times daily between meals. 09/08/21   Debbe Odea, MD  ?ferrous sulfate 325 (65 FE) MG tablet Take 1 tablet (325 mg total) by mouth 2 (two) times daily with a meal. 09/08/21 10/08/21  Debbe Odea, MD  ?OLANZapine (ZYPREXA) 10 MG tablet Take 1 tablet (10 mg total) by mouth at bedtime. 09/08/21 10/08/21  Debbe Odea, MD  ?polyethylene glycol powder (GLYCOLAX/MIRALAX) 17 GM/SCOOP powder Take 17 g by mouth daily as needed. 09/08/21   Debbe Odea, MD  ?prazosin (MINIPRESS) 2 MG capsule Take 1 capsule (2 mg total) by mouth at bedtime. ?Patient not taking: Reported on 09/03/2017 11/04/16 09/14/19  Lurena Nida, NP  ?   ? ?Allergies    ?Caffeine; Caffeine; Chocolate; Chocolate; Tuberculin, ppd; Sulfa antibiotics; and Sulfa antibiotics   ? ?Review of Systems   ?Review of Systems  ?Constitutional:  Positive for chills. Negative for fever.  ?Skin:  Positive for wound.  ? ?Physical Exam ?Updated Vital Signs ?BP (!) 151/92   Pulse 69   Temp 97.8 ?F (36.6 ?C) (Oral)   Resp 18   SpO2 97%  ?Physical Exam ?Vitals and nursing note reviewed.  ?Constitutional:   ?    General: He is not in acute distress. ?   Appearance: He is well-developed. He is not ill-appearing or diaphoretic.  ?HENT:  ?   Head: Normocephalic and atraumatic.  ?Cardiovascular:  ?   Rate and Rhythm: Normal rate and regular rhythm.  ?   Heart sounds: Normal heart sounds.  ?Pulmonary:  ?   Effort: Pulmonary effort is normal.  ?   Breath sounds: Normal breath sounds.  ?Abdominal:  ?   General: There is no distension.  ?Skin: ?   General: Skin is warm and dry.  ?   Comments: See picture of left buttock/hip.  2 deep wounds.  No active drainage though the dressing had a little bit of dried blood.  There does appear to be a little bit of erythema but no increased warmth compared to the rest of his body.  On the right buttock/hip there is a very superficial stage I wound.  ?Neurological:  ?   Mental Status: He is alert.  ? ? ? ?ED Results / Procedures / Treatments   ?Labs ?(all labs ordered are listed, but only abnormal results are displayed) ?Labs Reviewed  ?COMPREHENSIVE METABOLIC PANEL - Abnormal; Notable for the following components:  ?    Result Value  ? Calcium 8.8 (*)   ? Albumin 3.2 (*)   ? All other components within normal  limits  ?CBC WITH DIFFERENTIAL/PLATELET - Abnormal; Notable for the following components:  ? RBC 3.44 (*)   ? Hemoglobin 9.1 (*)   ? HCT 29.8 (*)   ? RDW 16.0 (*)   ? All other components within normal limits  ?APTT - Abnormal; Notable for the following components:  ? aPTT 38 (*)   ? All other components within normal limits  ?URINALYSIS, ROUTINE W REFLEX MICROSCOPIC - Abnormal; Notable for the following components:  ? APPearance HAZY (*)   ? Hgb urine dipstick SMALL (*)   ? Protein, ur 30 (*)   ? Leukocytes,Ua SMALL (*)   ? RBC / HPF >50 (*)   ? All other components within normal limits  ?CULTURE, BLOOD (ROUTINE X 2)  ?CULTURE, BLOOD (ROUTINE X 2)  ?URINE CULTURE  ?LACTIC ACID, PLASMA  ?PROTIME-INR  ?SEDIMENTATION RATE  ?C-REACTIVE PROTEIN  ? ? ?EKG ?EKG  Interpretation ? ?Date/Time:  Tuesday January 02 2022 14:34:03 EDT ?Ventricular Rate:  67 ?PR Interval:  95 ?QRS Duration: 86 ?QT Interval:  385 ?QTC Calculation: 407 ?R Axis:   64 ?Text Interpretation: Sinus rhythm Short PR interval LVH by voltage ST elev, probable normal early repol pattern Confirmed by Sherwood Gambler [phone]) on 01/02/2022 3:26:42 PM ? ?Radiology ?CT PELVIS W CONTRAST ? ?Result Date: 01/02/2022 ?CLINICAL DATA:  History of left gluteal decubitus ulcer and underlying osteomyelitis EXAM: CT PELVIS WITH CONTRAST TECHNIQUE: Multidetector CT imaging of the pelvis was performed using the standard protocol following the bolus administration of intravenous contrast. RADIATION DOSE REDUCTION: This exam was performed according to the departmental dose-optimization program which includes automated exposure control, adjustment of the mA and/or kV according to patient size and/or use of iterative reconstruction technique. CONTRAST:  110mL OMNIPAQUE IOHEXOL 300 MG/ML  SOLN COMPARISON:  07/27/2021 FINDINGS: Urinary Tract: Distal ureters are unremarkable. The bladder is decompressed, limiting its evaluation. Bowel: No bowel obstruction or ileus. No bowel wall thickening or inflammatory change. Vascular/Lymphatic: No significant vascular findings. Borderline enlarged bilateral inguinal lymph nodes are seen, largest lymph node on the left measuring 13 mm in short axis. Reproductive:  Unremarkable appearance of the prostate. Other: No free intraperitoneal fluid or free gas. No abdominal wall hernia. Musculoskeletal: The left gluteal decubitus ulcer seen on prior study is again identified, extending to the dorsal margin of the left ischium. Increased soft tissue prominence along the margins of the decubitus ulcer. Increasing inflammatory changes within the left gluteal musculature and subcutaneous tissues, without organized fluid collection or abscess at this time. Since the prior exam, there has been marked bony  destruction of the left ischium extending to the left inferior pubic ramus, with marked periosteal reaction consistent with progressive chronic osteomyelitis. Increasing subcutaneous edema within the right gluteal region overlying the right ischium, without frank ulceration at this time. The remaining visualized bony structures are unremarkable. IMPRESSION: 1. Continued left gluteal decubitus ulcer, with increasing inflammatory changes in the left gluteal musculature and subcutaneous tissues consistent with progressive cellulitis. No organized fluid collection or abscess. 2. Progressive bony destruction of the left ischium and left inferior ramus, with increased periosteal reaction, consistent with worsening chronic osteomyelitis. 3. Increasing subcutaneous inflammatory changes in the right gluteal region overlying the right ischium, without frank ulceration at this time. 4. Reactive adenopathy within the bilateral inguinal regions. Electronically Signed   By: Randa Ngo M.D.   On: 01/02/2022 16:17   ? ?Procedures ?Procedures  ? ? ?Medications Ordered in ED ?Medications  ?piperacillin-tazobactam (ZOSYN) IVPB 3.375 g (has  no administration in time range)  ?vancomycin (VANCOCIN) IVPB 1000 mg/200 mL premix (has no administration in time range)  ?iohexol (OMNIPAQUE) 300 MG/ML solution 100 mL (100 mLs Intravenous Contrast Given 01/02/22 1607)  ? ? ?ED Course/ Medical Decision Making/ A&P ?  ?                        ?Medical Decision Making ?Amount and/or Complexity of Data Reviewed ?External Data Reviewed: notes. ?Labs: ordered. ?Radiology: ordered and independent interpretation performed. ?ECG/medicine tests: ordered and independent interpretation performed. ? ?Risk ?Prescription drug management. ?Decision regarding hospitalization. ? ? ?Patient presents with worsening buttocks wounds.  See above picture.  He is not septic and while he transiently had 1 low blood pressure, this was probably a false value given the  rest of his blood pressures have been normal or high.  CT obtained and there does appear to be inflammatory changes consistent with cellulitis which goes along with exam as well as worsening bony destruction.  I am a little worried about his abi

## 2022-01-02 NOTE — H&P (Signed)
?History and Physical  ? ? ?Patient: Steve [name] UYQ:[id number] DOB: [birth year] ?DOA: 01/02/2022 ?DOS: the patient was seen and examined on 01/02/2022 ?[facility]P: [facility] [place]  ?Patient coming from: Home.  Lives with wife.  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 ?Chief Complaint:  ?Chief Complaint  ?Patient presents with  ? Wound Check  ? ?HPI: Steve [name]  ?31 year old M with PMH of C-spine injury with dysplasia and decubitus ulcer, anxiety, depression, PTSD, schizophrenia, polysubstance use and MRSA bacteremia with pelvic osteomyelitis for which he completed antibiotic course in 08/2021 brought to ED by EMS due to "back spasms, ulcers on my bottom, and my wife called paramedics when we got into argument". ? ?Patient is awake and oriented x4 but not a great historian.  No family member at bedside during my visit.  Attempted to call his mother who was not listed in his chart but no answer. ? ?Patient states spasm in his lower back.  He also reports worsening of the wound on his bottom.  He thinks there is some drainage from his bottom.  He says his wife help with wound care.  He states he got into argument with his wife and his wife called paramedics.  He denies physical altercation.  He is asking if someone can take him to the [place].  He denies other complaints but responds yes to fever and chills on review of system.  He states he did not check his temperature.  He also responds yes to nausea and emesis.  He reports 2 episodes today.  Emesis was not large.  He say he swallowed it.  He responds yes to cough.  He denies shortness of breath, abdominal pain or UTI symptoms.  He says he is not taking any medication on a regular basis.  He says he no longer takes olanzapine. ? ?Patient admits to smoking about 5 cigarettes a day.  He denies drinking alcohol.  He admits to using methamphetamine.  He prefers to remain full code. ? ?In ED, hypotensive to 79/58 on arrival but improved  without IV fluid.  He was not febrile.  CBC and CMP without significant finding other than Hgb of 9.1.  Lactic acid 1.1.  Coag labs within normal.  UA not impressive.  CT pelvis with contrast concerning for cellulitis/decubitus ulcer infection, worsening chronic pelvic osteomyelitis and reactive bilateral inguinal LAD.  Cultures obtained.  Patient was started on Zosyn and vancomycin and hospitalist service called for admission.  ?Review of Systems: As mentioned in the history of present illness. All other systems reviewed and are negative. ?Past Medical History:  ?Diagnosis Date  ? Anxiety   ? Paranoid schizophrenia ([facility])   ? Paraplegia ([place])   ? PTSD (post-traumatic stress disorder) Paranoid  ? ?Past Surgical History:  ?Procedure Laterality Date  ? banding procedure for morbid obesity    ? I & D EXTREMITY Right 02/27/2014  ? Procedure: IRRIGATION AND DEBRIDEMENT FOREARM AND REPAIR OF 30cm LACERATION;  Surgeon: Johnny Bridge, MD;  Location: [facility];  Service: Orthopedics;  Laterality: Right;  Anesthesia Regional with MAC  ? TEE WITHOUT CARDIOVERSION N/A 08/01/2021  ? Procedure: TRANSESOPHAGEAL ECHOCARDIOGRAM (TEE);  Surgeon: Pixie Casino, MD;  Location: [facility];  Service: Cardiovascular;  Laterality: N/A;  ? ?Social History:  reports that he has been smoking cigarettes. He has been smoking an average of .25 packs per day. He has never used smokeless tobacco. He reports that he does not currently use  alcohol. He reports current drug use. Drugs: Marijuana and Methamphetamines. ? ?Allergies  ?Allergen Reactions  ? Caffeine Anaphylaxis  ? Caffeine Anaphylaxis  ? Chocolate Anaphylaxis  ? Chocolate Anaphylaxis  ? Tuberculin, Ppd   ?  Other reaction(s): Eruption of skin  ? Sulfa Antibiotics Rash  ? Sulfa Antibiotics Rash  ? ? ?Family History  ?Problem Relation Age of Onset  ? Depression Mother   ? Bipolar disorder Father   ? ? ?Prior to Admission medications   ?Medication Sig Start Date End Date Taking? Authorizing  Provider  ?feeding supplement (ENSURE ENLIVE / ENSURE PLUS) LIQD Take 237 mLs by mouth 3 (three) times daily between meals. 09/08/21   Debbe Odea, MD  ?ferrous sulfate 325 (65 FE) MG tablet Take 1 tablet (325 mg total) by mouth 2 (two) times daily with a meal. 09/08/21 10/08/21  Debbe Odea, MD  ?OLANZapine (ZYPREXA) 10 MG tablet Take 1 tablet (10 mg total) by mouth at bedtime. 09/08/21 10/08/21  Debbe Odea, MD  ?polyethylene glycol powder (GLYCOLAX/MIRALAX) 17 GM/SCOOP powder Take 17 g by mouth daily as needed. 09/08/21   Debbe Odea, MD  ?prazosin (MINIPRESS) 2 MG capsule Take 1 capsule (2 mg total) by mouth at bedtime. ?Patient not taking: Reported on 09/03/2017 11/04/16 09/14/19  Lurena Nida, NP  ? ? ?Physical Exam: ?Vitals:  ? 01/02/22 1530 01/02/22 1545 01/02/22 1626 01/02/22 1630  ?BP: (!) 112/55 (!) 112/55 (!) 152/91 (!) 151/92  ?Pulse:  (!) 101 74 69  ?Resp: _0 ?Temp:      ?TempSrc:      ?SpO2:  96% 97% 97%  ? ?GENERAL: No apparent distress.  Nontoxic. ?HEENT: MMM.  Vision and hearing grossly intact.  ?NECK: Supple.  No apparent JVD.  ?RESP:  No IWOB.  Fair aeration bilaterally. ?CVS:  RRR. Heart sounds normal.  ?ABD/GI/GU: BS+. Abd soft, NTND.  ?MSK/EXT: Lower extremity hemiplegia with deformity. ?SKIN: Stage IV sacral decubitus, stage II bilateral lower extremity wounds and unstageable right heel wound ?NEURO: Awake and alert. Oriented appropriately.  No apparent focal neuro deficit. ?PSYCH: Calm. Normal affect.  ? ?Data Reviewed: ?See HPI ? ?Assessment and Plan: ?* Decubitus ulcer, unstageable with infection with chronic pelvic osteomyelitis ?Physical deconditioning previously completed treatment in 08/2021.  Wound appears clean.  However, CT pelvis raises concern for cellulitis and worsening chronic osteomyelitis.  He reports fever and chills but not a great historian.  He is not febrile here.  No leukocytosis or lactic acidosis.  Started on vancomycin and Zosyn.  Discussed with ID,  Dr. Tommy Medal. ?-Continue with IV vancomycin based on history. ?-Check CRP and ESR ?-Formal ID consult in the morning ?-Wound care consulted ? ?Injury of cervical spine with lower extremity dysphagia ?Supportive care. ?-Tylenol and baclofen as needed for pain ? ?Anxiety, depression, PTSD and schizophrenia ?Stable.  He says he no longer takes olanzapine. ?-Outpatient follow-up ? ?Normocytic anemia ?Recent Labs  ?  08/30/21 ?6568 08/31/21 ?0602 09/01/21 ?0502 09/02/21 ?0320 09/03/21 ?0409 09/04/21 ?1275 09/05/21 ?0402 09/06/21 ?1700 09/07/21 ?0323 01/02/22 ?1356  ?HGB 11.2* 10.6* 9.4* 10.5* 8.8* 9.1* 8.9* 9.2* 8.9* 9.1*  ?H&H at baseline. ?-Continue monitoring ? ?Pressure injury of skin ?He has what looks like stage IV sacral decubitus, stage II bilateral lower thigh wounds and unstageable right heel wound ?-Consulted WOCN. ? ?Polysubstance use disorder ?Admits to using meth ?-Check UDS ? ? ? ? ? Advance Care Planning:   Code Status: Full Code  ? ?Consults: Infectious disease  over the phone ? ?Family Communication: Attempted to call patient's mother for update but no answer. ? ?Severity of Illness: ?The appropriate patient status for this patient is INPATIENT. Inpatient status is judged to be reasonable and necessary in order to provide the required intensity of service to ensure the patient's safety. The patient's presenting symptoms, physical exam findings, and initial radiographic and laboratory data in the context of their chronic comorbidities is felt to place them at high risk for further clinical deterioration. Furthermore, it is not anticipated that the patient will be medically stable for discharge from the hospital within 2 midnights of admission.  ? ?* I certify that at the point of admission it is my clinical judgment that the patient will require inpatient hospital care spanning beyond 2 midnights from the point of admission due to high intensity of service, high risk for further deterioration and high  frequency of surveillance required.* ? ?Author: ?Mercy Riding, MD ?01/02/2022 6:02 PM ? ?For on call review [email or web address].  ?

## 2022-01-02 NOTE — Assessment & Plan Note (Addendum)
Recent Labs  ?  08/30/21 ?0355 08/31/21 ?0602 09/01/21 ?0502 09/02/21 ?0320 09/03/21 ?0409 09/04/21 ?3220 09/05/21 ?0402 09/06/21 ?2542 09/07/21 ?0323 01/02/22 ?1356  ?HGB 11.2* 10.6* 9.4* 10.5* 8.8* 9.1* 8.9* 9.2* 8.9* 9.1*  ? ?Hgb stable ?

## 2022-01-02 NOTE — Assessment & Plan Note (Deleted)
Supportive care. ?-Tylenol and baclofen for pain ?

## 2022-01-03 DIAGNOSIS — Z8659 Personal history of other mental and behavioral disorders: Secondary | ICD-10-CM | POA: Diagnosis not present

## 2022-01-03 DIAGNOSIS — L089 Local infection of the skin and subcutaneous tissue, unspecified: Secondary | ICD-10-CM | POA: Diagnosis not present

## 2022-01-03 DIAGNOSIS — L8995 Pressure ulcer of unspecified site, unstageable: Secondary | ICD-10-CM | POA: Diagnosis not present

## 2022-01-03 LAB — CBC
HCT: 28 % — ABNORMAL LOW (ref 39.0–52.0)
Hemoglobin: 8.3 g/dL — ABNORMAL LOW (ref 13.0–17.0)
MCH: 26.1 pg (ref 26.0–34.0)
MCHC: 29.6 g/dL — ABNORMAL LOW (ref 30.0–36.0)
MCV: 88.1 fL (ref 80.0–100.0)
Platelets: 223 10*3/uL (ref 150–400)
RBC: 3.18 MIL/uL — ABNORMAL LOW (ref 4.22–5.81)
RDW: 16.4 % — ABNORMAL HIGH (ref 11.5–15.5)
WBC: 4.7 10*3/uL (ref 4.0–10.5)
nRBC: 0 % (ref 0.0–0.2)

## 2022-01-03 LAB — COMPREHENSIVE METABOLIC PANEL
ALT: 14 U/L (ref 0–44)
AST: 18 U/L (ref 15–41)
Albumin: 3 g/dL — ABNORMAL LOW (ref 3.5–5.0)
Alkaline Phosphatase: 59 U/L (ref 38–126)
Anion gap: 5 (ref 5–15)
BUN: 18 mg/dL (ref 6–20)
CO2: 27 mmol/L (ref 22–32)
Calcium: 8.7 mg/dL — ABNORMAL LOW (ref 8.9–10.3)
Chloride: 104 mmol/L (ref 98–111)
Creatinine, Ser: 0.76 mg/dL (ref 0.61–1.24)
GFR, Estimated: >60 mL/min (ref >60)
Glucose, Bld: 104 mg/dL — ABNORMAL HIGH (ref 70–99)
Potassium: 4.5 mmol/L (ref 3.5–5.1)
Sodium: 136 mmol/L (ref 135–145)
Total Bilirubin: 0.5 mg/dL (ref 0.3–1.2)
Total Protein: 7 g/dL (ref 6.5–8.1)
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
## 2022-01-03 NOTE — Consult Note (Signed)
WOC Nurse Consult Note: ?Reason for Consult: pressure injuries ?Long standing history of pressure injuries; patient is paraplegic, cares for wounds at home ?Wound type: ?Left trochanter: Unstageable Pressure Injury ?Left ischium: Stage 4 Pressure injury ?Pressure Injury POA: Yes ?Measurement: ?Wound bed: ?80% black/20% pink, pale ?100% pale, non granular, chronic in appearance  ?Drainage (amount, consistency, odor) serosanguinous  ?Periwound: intact, left ischial wound with severe epibole of the wound edges; common with chronic non healing wounds  ?Dressing procedure/placement/frequency: ?Left trochanter; requested PT for hydrotherapy to clear necrotic tissue if possible, saline  moist gauze dressing ?Left ischium; saline fluff gauze packing. Conservative therapy based on chronicity and unlikely to heal  ?3. Low air loss mattress for moisture management and pressure re-distribution  ?4. Prevalon boots to offload heels; history of heel breakdown.  ? ?Re consult if needed, will not follow at this time. ?Thanks ? Alexandr Oehler [facility] MSN, RN,CWOCN, CNS, CWON-AP [phone])  ? ? ?  ?

## 2022-01-04 DIAGNOSIS — R4588 Nonsuicidal self-harm: Secondary | ICD-10-CM

## 2022-01-04 DIAGNOSIS — L8995 Pressure ulcer of unspecified site, unstageable: Secondary | ICD-10-CM | POA: Diagnosis not present

## 2022-01-04 DIAGNOSIS — Z8659 Personal history of other mental and behavioral disorders: Secondary | ICD-10-CM | POA: Diagnosis not present

## 2022-01-04 DIAGNOSIS — L089 Local infection of the skin and subcutaneous tissue, unspecified: Secondary | ICD-10-CM | POA: Diagnosis not present

## 2022-01-04 LAB — URINE CULTURE: Culture: NO GROWTH
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
## 2022-01-04 NOTE — Plan of Care (Signed)
?  Problem: Education: ?Goal: Knowledge of General Education information will improve ?Description: Including pain rating scale, medication(s)/side effects and non-pharmacologic comfort measures ?Outcome: Progressing ?  ?Problem: Health Behavior/Discharge Planning: ?Goal: Ability to manage health-related needs will improve ?Outcome: Progressing ?  ?Problem: Clinical Measurements: ?Goal: Ability to maintain clinical measurements within normal limits will improve ?Outcome: Not Progressing ?Goal: Will remain free from infection ?Outcome: Progressing ?Goal: Diagnostic test results will improve ?Outcome: Progressing ?Goal: Respiratory complications will improve ?Outcome: Progressing ?Goal: Cardiovascular complication will be avoided ?Outcome: Progressing ?  ?Problem: Activity: ?Goal: Risk for activity intolerance will decrease ?Outcome: Progressing ?  ?Problem: Nutrition: ?Goal: Adequate nutrition will be maintained ?Outcome: Progressing ?  ?Problem: Coping: ?Goal: Level of anxiety will decrease ?Outcome: Progressing ?  ?Problem: Pain Managment: ?Goal: General experience of comfort will improve ?Outcome: Progressing ?  ?Problem: Safety: ?Goal: Ability to remain free from injury will improve ?Outcome: Progressing ?  ?
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
## 2022-01-05 DIAGNOSIS — L8995 Pressure ulcer of unspecified site, unstageable: Secondary | ICD-10-CM | POA: Diagnosis not present

## 2022-01-05 DIAGNOSIS — F419 Anxiety disorder, unspecified: Secondary | ICD-10-CM | POA: Diagnosis not present

## 2022-01-05 DIAGNOSIS — L089 Local infection of the skin and subcutaneous tissue, unspecified: Secondary | ICD-10-CM | POA: Diagnosis not present

## 2022-01-05 DIAGNOSIS — G8220 Paraplegia, unspecified: Secondary | ICD-10-CM | POA: Diagnosis not present

## 2022-01-05 LAB — CBC
HCT: 26.5 % — ABNORMAL LOW (ref 39.0–52.0)
Hemoglobin: 8.1 g/dL — ABNORMAL LOW (ref 13.0–17.0)
MCH: 26.9 pg (ref 26.0–34.0)
MCHC: 30.6 g/dL (ref 30.0–36.0)
MCV: 88 fL (ref 80.0–100.0)
Platelets: 203 10*3/uL (ref 150–400)
RBC: 3.01 MIL/uL — ABNORMAL LOW (ref 4.22–5.81)
RDW: 16.1 % — ABNORMAL HIGH (ref 11.5–15.5)
WBC: 3.7 10*3/uL — ABNORMAL LOW (ref 4.0–10.5)
nRBC: 0 % (ref 0.0–0.2)

## 2022-01-05 LAB — COMPREHENSIVE METABOLIC PANEL
ALT: 11 U/L (ref 0–44)
AST: 11 U/L — ABNORMAL LOW (ref 15–41)
Albumin: 2.9 g/dL — ABNORMAL LOW (ref 3.5–5.0)
Alkaline Phosphatase: 50 U/L (ref 38–126)
Anion gap: 3 — ABNORMAL LOW (ref 5–15)
BUN: 17 mg/dL (ref 6–20)
CO2: 27 mmol/L (ref 22–32)
Calcium: 8.8 mg/dL — ABNORMAL LOW (ref 8.9–10.3)
Chloride: 109 mmol/L (ref 98–111)
Creatinine, Ser: 0.51 mg/dL — ABNORMAL LOW (ref 0.61–1.24)
GFR, Estimated: >60 mL/min (ref >60)
Glucose, Bld: 94 mg/dL (ref 70–99)
Potassium: 5 mmol/L (ref 3.5–5.1)
Sodium: 139 mmol/L (ref 135–145)
Total Bilirubin: 0.2 mg/dL — ABNORMAL LOW (ref 0.3–1.2)
Total Protein: 7.1 g/dL (ref 6.5–8.1)

## 2022-01-05 LAB — LIPID PANEL
Cholesterol: 117 mg/dL (ref 0–200)
HDL: 37 mg/dL — ABNORMAL LOW (ref >40)
LDL Cholesterol: 73 mg/dL (ref 0–99)
Total CHOL/HDL Ratio: 3.2 RATIO
Triglycerides: 37 mg/dL (ref <150)
VLDL: 7 mg/dL (ref 0–40)

## 2022-01-05 LAB — HEMOGLOBIN A1C
Hgb A1c MFr Bld: 4.6 % — ABNORMAL LOW (ref 4.8–5.6)
Mean Plasma Glucose: 85.32 mg/dL

## 2022-01-05 MED ORDER — OLANZAPINE 5 MG PO TABS: 5.0000 mg | ORAL_TABLET | Freq: Every day | ORAL | 0 refills | Status: DC

## 2022-01-05 NOTE — Discharge Instructions (Signed)
Dressing changes once or twice a daily depending on the amount of drainage noted ?

## 2022-01-05 NOTE — TOC Progression Note (Addendum)
Transition of Care (TOC) - Progression Note  ? ? ?Patient Details  ?Name: SEARCY OOTEN ?MRN: [id number] ?Date of Birth: 31-Dec-1990 ? ?Transition of Care (TOC) CM/SW Contact  ?Drew, LCSW ?Phone Number: ?01/05/2022, 10:30 AM ? ?Clinical Narrative:    ? ?CSW called pt's mom to clarify pt's care history and current living situation. No answer; left voicemail requesting return call.  ? ?Per chart review, pt was previous set up with home health in December 2022. He has [facility] Education officer, museum  ?through the [employer] as Lennox Grumbles [phone] ext. [phone]), Pt's PCP is documented as Dr. Linna Hoff, Fax # [phone]. Pt was also previously referred to [facility] wound clinic in December 2022. It is unclear of pt's level of follow up with [facility] services as he previously denied having a PCP or Education officer, museum at [place]. CSW called and left message with [facility] social worker Lennox Grumbles requesting return call.  ? ?CSW called number listed for pt. Pt's spouse answered. CSW explained that pt may be d/c'd today. CSW explained that pt was expressing desire to get services with [facility] and mentioned needing  new wheelchair. Pt's spouse confirmed that pt is already connected with the [facility] and states that "they are gonna get him all of that." She explains that pt has an appointment on 01/25/22. She states pt and herself live together in [place] and she takes him to appointments. She states she would be able to pick pt up at [place] and would just need to know what time.  ? ?Expected Discharge Plan: Home/Self Care  ?Barriers to Discharge: Continued Medical Work up ? ?Expected Discharge Plan and Services ?Expected Discharge Plan: Home/Self Care ?  ?  ?  ?Living arrangements for the past 2 months: [place] ?                ?  ?  ?  ?  ?  ?  ?  ?  ?  ?  ? ? ?Social Determinants of Health (SDOH) Interventions ?  ? ?Readmission Risk Interventions ?   ? View : No data to display.  ?  ?  ?  ? ? ?

## 2022-01-05 NOTE — Progress Notes (Signed)
Physical Therapy Wound Treatment ?Patient Details  ?Name: Steve [name] ?MRN: [id number] ?Date of Birth: 15-Sep-1991 ? ?Today's Date: 01/05/2022 ?Time: 1132-1207 ?Time Calculation (min): 35 min ? ?Subjective  ?Subjective Assessment ?Subjective: pt pleasant and discussing movies with therapist. ?Patient and Family Stated Goals: to heal ?Date of Onset:  (PTA) ?Prior Treatments: Pt reports wound developed quickly in last 2 months. states he has not been caring for it or seeing anyone for treatment.  ?Pain Score:   ? ?Wound Assessment  ?Pressure Injury 01/03/22 Hip Left;Posterior;Lateral Stage 4 - Full thickness tissue loss with exposed bone, tendon or muscle. PT HYDROtherapy ONLY (greater trochanter) (Active)  ?Wound Image   01/05/22 1215  ?Dressing Type Foam - Lift dressing to assess site every shift;Gauze (Comment);Normal saline moist dressing;Barrier Film (skin prep) 01/05/22 1215  ?Dressing Changed 01/05/22 1215  ?Dressing Change Frequency PRN 01/05/22 1215  ?State of Healing Non-healing 01/05/22 1215  ?Site / Wound Assessment Pink;Red;Yellow;Purple 01/05/22 1215  ?% Wound base Red or Granulating 40% 01/05/22 1215  ?% Wound base Yellow/Fibrinous Exudate 60% 01/05/22 1215  ?% Wound base Black/Eschar 0% 01/05/22 1215  ?% Wound base Other/Granulation Tissue (Comment) 0% 01/05/22 1215  ?Peri-wound Assessment Intact;Erythema (non-blanchable);Pink 01/05/22 1215  ?Wound Length (cm) 4.2 cm 01/03/22 1400  ?Wound Width (cm) 4.5 cm 01/03/22 1400  ?Wound Depth (cm) 1.1 cm 01/03/22 1400  ?Wound Surface Area (cm^2) 18.9 cm^2 01/03/22 1400  ?Wound Volume (cm^3) 20.79 cm^3 01/03/22 1400  ?Undermining (cm) 1.2cm from 6:00 to 9:00 o'clock 01/03/22 1400  ?Margins Unattached edges (unapproximated) 01/05/22 1215  ?Drainage Amount Moderate 01/05/22 1215  ?Drainage Description Serous 01/05/22 1215  ?Treatment Cleansed;Debridement (Selective);Packing (Saline gauze) 01/05/22 1215  ? ?Hydrotherapy ?Pulsed lavage therapy - wound location:  greater trochanter wound and some to ischial tuberostiy wound ?Pulsed Lavage with Suction (psi): 8 psi ?Pulsed Lavage with Suction - Normal Saline Used: 1000 mL ?Pulsed Lavage Tip: Tip with splash shield ?Selective Debridement ?Selective Debridement - Location: greater trochanter wound ?Selective Debridement - Tools Used: Forceps ?Selective Debridement - Tissue Removed: necrotic tissue and slough  ? ? ?Wound Assessment and Plan  ?Wound Therapy - Assess/Plan/Recommendations ?Wound Therapy - Clinical Statement: Pt's wound progressing well and increased non-granular healthy tissue after debridement of slough. Saline syringe used to irrigate wound for cleansing. Continued with saline guaze packing and dressed with silicone foam cover for pressure relief and absorption. He will benefit from continued skilled PT Hydrotherapy to reduce wound bioburden and improve healing environement. Will continue 3x/wk MWF with RN completing dressing change on the days hydro is not completed. ?Wound Therapy - Functional Problem List: immobility, poor skin integrity ?Factors Delaying/Impairing Wound Healing: Altered sensation, Immobility, Multiple medical problems, Substance abuse, Vascular compromise ?Hydrotherapy Plan: Debridement, Dressing change, Patient/family education, Pulsatile lavage with suction ?Wound Therapy - Frequency: 3X / week (MWF) ?Wound Therapy - Current Recommendations: WOC nurse ?Wound Therapy - Follow Up Recommendations: f/u pulsed lavage with suction, f/u selective debridement, dressing changes by RN, dressing changes by family/patient ? ?Wound Therapy Goals- ?Improve the function of patient's integumentary system by progressing the wound(s) through the phases of wound healing (inflammation - proliferation - remodeling) by: ?Wound Therapy Goals - Improve the function of patient's integumentary system by progressing the wound(s) through the phases of wound healing by: ?Decrease Necrotic Tissue to: 30% ?Decrease  Necrotic Tissue - Progress: Goal set today ?Increase Granulation Tissue to: 70% ?Increase Granulation Tissue - Progress: Goal set today ?Patient/Family will be able to : to complete dressing change to  both wounds on Lt posterior hip ?Patient/Family Instruction Goal - Progress: Goal set today ?Goals/treatment plan/discharge plan were made with and agreed upon by patient/family: Yes ?Time For Goal Achievement: 2 weeks ?Wound Therapy - Potential for Goals: Good ? ?Goals will be updated until maximal potential achieved or discharge criteria met.  Discharge criteria: when goals achieved, discharge from hospital, MD decision/surgical intervention, no progress towards goals, refusal/missing three consecutive treatments without notification or medical reason. ? ?GP ?   ? ?Charges ?PT Wound Care Charges ?$Wound Debridement up to 20 cm: < or equal to 20 cm ?$PT Hydrotherapy Dressing: 1 dressing ?$PT Hydrotherapy Visit: 1 Visit ?  ?  ? ?Gwynneth Albright PT, DPT ?Acute Rehabilitation Services ?Office [phone] ?Pager [phone]  ? ? ?Jacques Navy ?01/05/2022, 12:27 PM ? ?

## 2022-01-05 NOTE — Consult Note (Signed)
?  Steve [name] Is a 31 year old male admitted medically forIs a 31 year old male admitted medically for hypotension and sepsis.  He carries a psychiatric diagnosis of schizophrenia and has a past medical history of C-spine injury with dysplasia, decubitus ulcer, anxiety, depression, PTSD, polysubstance use, MRSA bacteremia with pelvic osteomyelitis.  Psychiatry was consulted for reported thoughts of self-harm by Dr. Narda Rutherford. ? ?Patient is seen and reassessed, case discussed with Dr. Gasper Sells and attending Dr. Rhona Leavens.  On exam patient is observed to be lying in bed, he does sit upright and initiates conversation with Clinical research associate.  Without revealing department information, writer introduced self and reason for presenting.  Patient states he is in the hospital for pressure ulcers, due to his paraplegia injury.  He states he has received antibiotics, wound therapy, and physical therapy while here.  Patient states his ultimate goal is to be released to local [facility].  Discussed with patient while that may be his ultimate goal, we are unable to initiate a hospital to hospital transfer when he is medically stable and at discharge.  We did inquire about resources he was planning to seek from the [facility], which include medication management, and wheelchair parts.  He acknowledged he does not need medical level of care from the [facility], more so resources.  He does agree that he feels stable enough to discharge home today if given the opportunity. ? ?From a psychiatric standpoint patient did deny any acute psychiatric symptoms.  He did not appear to be presenting with any acute symptoms to include responding to internal stimuli, external stimuli, and or exhibiting delusional thought disorder.  He was initiated on olanzapine 5 mg p.o. nightly, which he seems to have some of a therapeutic response at this time as evident by his denial of tactile hallucinations and psychosis.  He denies any  side effects and/or adverse reactions at this time.  He denies any acute withdrawal symptoms, from chronic methamphetamine use.  He denies any suicidal ideations, homicidal ideations, and or auditory or visual hallucinations.  He is able to contract for safety at this time.  He also appears to be psychiatrically stable to discharge home, with outpatient resources.  The above findings have been discussed with the medical team.  Psychiatry to sign off at this time. ? ?

## 2022-01-05 NOTE — Progress Notes (Signed)
Discharge instructions discussed with patient and wife. All questions answered. Assisted with dressing. IV removed. Wife moved patient to personal wheelchair and pt accompanied outside with NT  ?

## 2022-01-05 NOTE — Discharge Summary (Signed)
?Physician Discharge Summary ?  ?Patient: Steve [name] MRN: [id number] DOB: [birth year]  ?Admit date:     01/02/2022  ?Discharge date: 01/05/22  ?Discharge Physician: Marylu Lund  ? ?PCP: [facility] [place]  ? ?Recommendations at discharge:  ? ? Follow up with PCP as scheduled ?Wound care: ?Left ischial wound: flush with saline/use toomey syringe.  Fluff fill undermining and wound bed with saline moist gauze. Top with ABD pad, secure with tape or foam based on drainage.  ?Left trochanter: saline gauze dressing change 1-2x daily  ? ?Discharge Diagnoses: ?Principal Problem: ?  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  Anxiety, depression, PTSD and schizophrenia ?  Injury of cervical spine with lower extremity dysphagia ?  Polysubstance use disorder ?  Pressure injury of skin ?  Normocytic anemia ?  Post traumatic stress disorder (PTSD) ?  Anxiety and depression ?  Diplegia of both lower extremities ([place]) ?  History of schizophrenia ?  Chronic osteomyelitis, pelvis ([place]) ? ?Resolved Problems: ?  * No resolved hospital problems. * ? ?Hospital Course: ?31 year old M with PMH of C-spine injury with dysplasia and decubitus ulcer, anxiety, depression, PTSD, schizophrenia, polysubstance use and MRSA bacteremia with pelvic osteomyelitis for which he completed antibiotic course in 08/2021 brought to ED by EMS due to "back spasms, ulcers on my bottom, and my wife called paramedics when we got into argument". ? ?Patient is awake and oriented x4 but not a great historian.  No family member at bedside during my visit.  Attempted to call his mother who was not listed in his chart but no answer. ? ?Patient states spasm in his lower back.  He also reports worsening of the wound on his bottom.  He thinks there is some drainage from his bottom.  He says his wife help with wound care.  He states he got into argument with his wife and his wife called paramedics.  He denies  physical altercation.  He is asking if someone can take him to the [place].  He denies other complaints but responds yes to fever and chills on review of system.  He states he did not check his temperature.  He also responds yes to nausea and emesis.  He reports 2 episodes today.  Emesis was not large.  He say he swallowed it.  He responds yes to cough.  He denies shortness of breath, abdominal pain or UTI symptoms.  He says he is not taking any medication on a regular basis.  He says he no longer takes olanzapine. ? ?Patient admits to smoking about 5 cigarettes a day.  He denies drinking alcohol.  He admits to using methamphetamine.  He prefers to remain full code. ? ?In ED, hypotensive to 79/58 on arrival but improved without IV fluid.  He was not febrile.  CBC and CMP without significant finding other than Hgb of 9.1.  Lactic acid 1.1.  Coag labs within normal.  UA not impressive.  CT pelvis with contrast concerning for cellulitis/decubitus ulcer infection, worsening chronic pelvic osteomyelitis and reactive bilateral inguinal LAD.  Cultures obtained.  Patient was started on Zosyn and vancomycin and hospitalist service called for admission. ? 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  Wound appears clean.  However, CT pelvis raises concern for cellulitis and worsening chronic osteomyelitis.   ?-Discussed with ID. Likely chronic decub with chronic osteo, no evidence of abscess  or soft tissue infection. ID recs to watch off abx  ?-Cultures have remained neg x 3 days. Afebrile and no leukocytosis ? ?Injury of cervical spine with lower extremity dysphagia ?- Supportive care. ?-Tylenol and baclofen as needed for pain ? ?Anxiety, depression, PTSD and schizophrenia ?Stable.  He says he no longer takes olanzapine. ?-Reported to nursing staff issues relating to depression and even self-harm in the  past ?-Consulted Psychiatry. Pt has been cleared by psych for discharge ?-Of note, patient was very keen on a transfer to the [place] in [place]. When asked, pt admitted that he believes the [facility] would keep him inpatient longer, thus he would have a place to stay in that regard ?-[facility] also [facility] by ways of "you just go up there and say you are suicidal" ?-this AM, pt denied any active suicidal ideations ? ?Normocytic anemia ?Recent Labs  ?  08/30/21 ?[id number] 08/31/21 ?0602 09/01/21 ?0502 09/02/21 ?0320 09/03/21 ?0409 09/04/21 ?[id number] 09/05/21 ?0402 09/06/21 ?[id number] 09/07/21 ?0323 01/02/22 ?1356  ?HGB 11.2* 10.6* 9.4* 10.5* 8.8* 9.1* 8.9* 9.2* 8.9* 9.1*  ? ?Hgb stable ? ?Pressure injury of skin ?He has what looks like stage IV sacral decubitus, stage II bilateral lower thigh wounds and unstageable right heel wound ?-Consulted WOCN. ? ?Polysubstance use disorder ?Admits to using meth, confirmed on UDS ?-Will need cessation ? ? ? ? ?  ? ? ?Consultants: Psychiatry, ID ?Procedures performed:   ?Disposition: Home ?Diet recommendation:  ?Regular diet ?DISCHARGE MEDICATION: ?Allergies as of 01/05/2022   ? ?   Reactions  ? Caffeine Anaphylaxis  ? Caffeine Anaphylaxis  ? Chocolate Anaphylaxis  ? Chocolate Anaphylaxis  ? Sulfur   ? Tuberculin, Ppd   ? Other reaction(s): Eruption of skin  ? Sulfa Antibiotics Rash  ? Sulfa Antibiotics Rash  ? ?  ? ?  ?Medication List  ?  ? ?TAKE these medications   ? ?OLANZapine 5 MG tablet ?Commonly known as: ZyPREXA ?Take 1 tablet (5 mg total) by mouth at bedtime. ?  ? ?  ? ? Follow-up Information   ? ? [facility] Follow up in 2 week(s).   ?Specialty: General Practice ?Why: Hospital follow up ?Contact information: ?[address] ?[place] [address] ?[phone] ? ? ?  ?  ? ?  ?  ? ?  ? ?Discharge Exam: ?Filed Weights  ? 01/04/22 2300  ?Weight: 72.3 kg  ? ?General exam: Awake, laying in bed, in nad ?Respiratory system: Normal respiratory  effort, no wheezing ?Cardiovascular system: regular rate, s1, s2 ?Gastrointestinal system: Soft, nondistended, positive BS ?Central nervous system: CN2-12 grossly intact, strength intact ?Extremities: Perfused, no clubbing ?Skin: Normal skin turgor, no notable skin lesions seen ?Psychiatry: Mood normal // no visual hallucinations  ? ?Condition at discharge: fair ? ?The results of significant diagnostics from this hospitalization (including imaging, microbiology, ancillary and laboratory) are listed below for reference.  ? ?Imaging Studies: ?CT PELVIS W CONTRAST ? ?Result Date: 01/02/2022 ?CLINICAL DATA:  History of left gluteal decubitus ulcer and underlying osteomyelitis EXAM: CT PELVIS WITH CONTRAST TECHNIQUE: Multidetector CT imaging of the pelvis was performed using the standard protocol following the bolus administration of intravenous contrast. RADIATION DOSE REDUCTION: This exam was performed according to the departmental dose-optimization program which includes automated exposure control, adjustment of the mA and/or kV according to patient size and/or use of iterative reconstruction technique. CONTRAST:  175mL OMNIPAQUE IOHEXOL 300 MG/ML  SOLN COMPARISON:  07/27/2021 FINDINGS: Urinary Tract: Distal ureters are unremarkable. The bladder is decompressed, limiting its evaluation. Bowel: No bowel obstruction or ileus. No bowel wall thickening or inflammatory change. Vascular/Lymphatic: No significant vascular findings. Borderline enlarged bilateral inguinal lymph nodes are seen, largest lymph node on the left measuring 13 mm in short axis. Reproductive:  Unremarkable appearance of the prostate. Other: No free intraperitoneal fluid or free gas. No abdominal wall hernia. Musculoskeletal: The left gluteal decubitus ulcer seen on prior study is again identified, extending to the dorsal margin of the left ischium. Increased soft tissue prominence along the margins of the decubitus ulcer. Increasing inflammatory  changes within the left gluteal musculature and subcutaneous tissues, without organized fluid collection or abscess at this time. Since the prior exam, there has been marked bony destruction of the left ischium ex

## 2022-01-05 NOTE — TOC Progression Note (Signed)
Transition of Care (TOC) - Progression Note  ? ? ?Patient Details  ?Name: Steve [name] ?MRN: [id number] ?Date of Birth: Jul 19, 1991 ? ?Transition of Care (TOC) CM/SW Contact  ?Keymora Grillot Aris Lot, LCSW ?Phone Number: ?01/05/2022, 2:57 PM ? ?Clinical Narrative:    ? ?CSW called pt's spouse and notified her of DC. She states she will be here to pick him up within next 30 minutes.  ? ?Expected Discharge Plan: Home/Self Care ?Barriers to Discharge: Continued Medical Work up ? ?Expected Discharge Plan and Services ?Expected Discharge Plan: Home/Self Care ?  ?  ?  ?Living arrangements for the past 2 months: Single Family Home ?Expected Discharge Date: 01/05/22               ?  ?  ?  ?  ?  ?  ?  ?  ?  ?  ? ? ?Social Determinants of Health (SDOH) Interventions ?  ? ?Readmission Risk Interventions ?   ? View : No data to display.  ?  ?  ?  ? ? ?

## 2022-01-07 LAB — CULTURE, BLOOD (ROUTINE X 2)
Culture: NO GROWTH
Culture: NO GROWTH
Special Requests: ADEQUATE

## 2022-05-18 ENCOUNTER — Encounter (HOSPITAL_COMMUNITY): Payer: Self-pay

## 2022-05-18 ENCOUNTER — Emergency Department (HOSPITAL_COMMUNITY): Payer: No Typology Code available for payment source

## 2022-05-18 ENCOUNTER — Inpatient Hospital Stay (HOSPITAL_COMMUNITY)
Admission: EM | Admit: 2022-05-18 | Discharge: 2022-05-25 | DRG: 871 | Disposition: A | Discharge status: Home Health | Payer: No Typology Code available for payment source | Attending: Internal Medicine | Admitting: Internal Medicine

## 2022-05-18 ENCOUNTER — Other Ambulatory Visit: Payer: Self-pay

## 2022-05-18 DIAGNOSIS — Z7401 Bed confinement status: Secondary | ICD-10-CM

## 2022-05-18 DIAGNOSIS — L03116 Cellulitis of left lower limb: Secondary | ICD-10-CM | POA: Diagnosis present

## 2022-05-18 DIAGNOSIS — G8250 Quadriplegia, unspecified: Secondary | ICD-10-CM | POA: Diagnosis present

## 2022-05-18 DIAGNOSIS — R6510 Systemic inflammatory response syndrome (SIRS) of non-infectious origin without acute organ dysfunction: Principal | ICD-10-CM

## 2022-05-18 DIAGNOSIS — F4310 Post-traumatic stress disorder, unspecified: Secondary | ICD-10-CM | POA: Diagnosis present

## 2022-05-18 DIAGNOSIS — R509 Fever, unspecified: Secondary | ICD-10-CM | POA: Diagnosis present

## 2022-05-18 DIAGNOSIS — E871 Hypo-osmolality and hyponatremia: Secondary | ICD-10-CM | POA: Diagnosis present

## 2022-05-18 DIAGNOSIS — Z888 Allergy status to other drugs, medicaments and biological substances status: Secondary | ICD-10-CM

## 2022-05-18 DIAGNOSIS — Z91018 Allergy to other foods: Secondary | ICD-10-CM | POA: Diagnosis not present

## 2022-05-18 DIAGNOSIS — F200 Paranoid schizophrenia: Secondary | ICD-10-CM | POA: Diagnosis present

## 2022-05-18 DIAGNOSIS — M8668 Other chronic osteomyelitis, other site: Secondary | ICD-10-CM | POA: Diagnosis present

## 2022-05-18 DIAGNOSIS — L89154 Pressure ulcer of sacral region, stage 4: Secondary | ICD-10-CM | POA: Diagnosis present

## 2022-05-18 DIAGNOSIS — M86652 Other chronic osteomyelitis, left thigh: Secondary | ICD-10-CM | POA: Diagnosis present

## 2022-05-18 DIAGNOSIS — A419 Sepsis, unspecified organism: Secondary | ICD-10-CM | POA: Diagnosis present

## 2022-05-18 DIAGNOSIS — I9589 Other hypotension: Secondary | ICD-10-CM | POA: Diagnosis present

## 2022-05-18 DIAGNOSIS — Z882 Allergy status to sulfonamides status: Secondary | ICD-10-CM

## 2022-05-18 DIAGNOSIS — M86352 Chronic multifocal osteomyelitis, left femur: Secondary | ICD-10-CM | POA: Diagnosis present

## 2022-05-18 DIAGNOSIS — Z9884 Bariatric surgery status: Secondary | ICD-10-CM | POA: Diagnosis not present

## 2022-05-18 DIAGNOSIS — L89894 Pressure ulcer of other site, stage 4: Secondary | ICD-10-CM | POA: Diagnosis present

## 2022-05-18 DIAGNOSIS — Z79899 Other long term (current) drug therapy: Secondary | ICD-10-CM

## 2022-05-18 DIAGNOSIS — F419 Anxiety disorder, unspecified: Secondary | ICD-10-CM | POA: Diagnosis present

## 2022-05-18 DIAGNOSIS — Z20822 Contact with and (suspected) exposure to covid-19: Secondary | ICD-10-CM | POA: Diagnosis present

## 2022-05-18 DIAGNOSIS — L89324 Pressure ulcer of left buttock, stage 4: Secondary | ICD-10-CM | POA: Diagnosis present

## 2022-05-18 DIAGNOSIS — M86659 Other chronic osteomyelitis, unspecified thigh: Secondary | ICD-10-CM | POA: Diagnosis present

## 2022-05-18 DIAGNOSIS — F32A Depression, unspecified: Secondary | ICD-10-CM | POA: Diagnosis present

## 2022-05-18 DIAGNOSIS — D62 Acute posthemorrhagic anemia: Secondary | ICD-10-CM | POA: Diagnosis present

## 2022-05-18 DIAGNOSIS — F17210 Nicotine dependence, cigarettes, uncomplicated: Secondary | ICD-10-CM | POA: Diagnosis present

## 2022-05-18 DIAGNOSIS — Z818 Family history of other mental and behavioral disorders: Secondary | ICD-10-CM

## 2022-05-18 LAB — URINALYSIS, ROUTINE W REFLEX MICROSCOPIC
Bilirubin Urine: NEGATIVE
Glucose, UA: NEGATIVE mg/dL
Ketones, ur: NEGATIVE mg/dL
Nitrite: NEGATIVE
Protein, ur: 30 mg/dL — AB
Specific Gravity, Urine: 1.033 — ABNORMAL HIGH (ref 1.005–1.030)
WBC, UA: >50 WBC/hpf — ABNORMAL HIGH (ref 0–5)
pH: 5 (ref 5.0–8.0)

## 2022-05-18 LAB — COMPREHENSIVE METABOLIC PANEL
ALT: 8 U/L (ref 0–44)
AST: 8 U/L — ABNORMAL LOW (ref 15–41)
Albumin: 2.3 g/dL — ABNORMAL LOW (ref 3.5–5.0)
Alkaline Phosphatase: 81 U/L (ref 38–126)
Anion gap: 6 (ref 5–15)
BUN: 14 mg/dL (ref 6–20)
CO2: 23 mmol/L (ref 22–32)
Calcium: 7.6 mg/dL — ABNORMAL LOW (ref 8.9–10.3)
Chloride: 102 mmol/L (ref 98–111)
Creatinine, Ser: 0.7 mg/dL (ref 0.61–1.24)
GFR, Estimated: >60 mL/min (ref >60)
Glucose, Bld: 108 mg/dL — ABNORMAL HIGH (ref 70–99)
Potassium: 3.9 mmol/L (ref 3.5–5.1)
Sodium: 131 mmol/L — ABNORMAL LOW (ref 135–145)
Total Bilirubin: 0.2 mg/dL — ABNORMAL LOW (ref 0.3–1.2)
Total Protein: 7.4 g/dL (ref 6.5–8.1)

## 2022-05-18 LAB — MAGNESIUM: Magnesium: 2.1 mg/dL (ref 1.7–2.4)

## 2022-05-18 LAB — RESP PANEL BY RT-PCR (FLU A&B, COVID) ARPGX2
Influenza A by PCR: NEGATIVE
Influenza B by PCR: NEGATIVE
SARS Coronavirus 2 by RT PCR: NEGATIVE

## 2022-05-18 LAB — RAPID URINE DRUG SCREEN, HOSP PERFORMED
Amphetamines: POSITIVE — AB
Barbiturates: NOT DETECTED
Benzodiazepines: NOT DETECTED
Cocaine: NOT DETECTED
Opiates: POSITIVE — AB
Tetrahydrocannabinol: NOT DETECTED

## 2022-05-18 LAB — CBC
HCT: 25.6 % — ABNORMAL LOW (ref 39.0–52.0)
Hemoglobin: 8 g/dL — ABNORMAL LOW (ref 13.0–17.0)
MCH: 25.9 pg — ABNORMAL LOW (ref 26.0–34.0)
MCHC: 31.3 g/dL (ref 30.0–36.0)
MCV: 82.8 fL (ref 80.0–100.0)
Platelets: 370 10*3/uL (ref 150–400)
RBC: 3.09 MIL/uL — ABNORMAL LOW (ref 4.22–5.81)
RDW: 15.8 % — ABNORMAL HIGH (ref 11.5–15.5)
WBC: 8.6 10*3/uL (ref 4.0–10.5)
nRBC: 0 % (ref 0.0–0.2)

## 2022-05-18 LAB — LACTIC ACID, PLASMA: Lactic Acid, Venous: 0.9 mmol/L (ref 0.5–1.9)

## 2022-05-18 LAB — CK: Total CK: 114 U/L (ref 49–397)

## 2022-05-18 MED ORDER — MORPHINE SULFATE (PF) 4 MG/ML IV SOLN
4.0000 mg | Freq: Once | INTRAVENOUS | Status: AC
Start: 2022-05-18 — End: 2022-05-18
  Administered 2022-05-18: 4 mg via INTRAVENOUS
  Filled 2022-05-18: qty 1

## 2022-05-18 MED ORDER — IOHEXOL 300 MG/ML  SOLN: 100.0000 mL | Freq: Once | INTRAMUSCULAR | Status: DC | PRN

## 2022-05-18 MED ORDER — SODIUM CHLORIDE 0.9 % IV BOLUS
1000.0000 mL | Freq: Once | INTRAVENOUS | Status: AC
  Administered 2022-05-18: 1000 mL via INTRAVENOUS

## 2022-05-18 MED ORDER — LACTATED RINGERS IV SOLN: INTRAVENOUS | Status: DC

## 2022-05-18 MED ORDER — VANCOMYCIN HCL 750 MG/150ML IV SOLN
750.0000 mg | Freq: Two times a day (BID) | INTRAVENOUS | Status: DC
  Administered 2022-05-19 – 2022-05-22 (×7): 750 mg via INTRAVENOUS
  Filled 2022-05-18 (×7): qty 150

## 2022-05-18 MED ORDER — OLANZAPINE 5 MG PO TABS
5.0000 mg | ORAL_TABLET | Freq: Every day | ORAL | Status: DC
  Administered 2022-05-18: 5 mg via ORAL
  Filled 2022-05-18: qty 1

## 2022-05-18 MED ORDER — SODIUM CHLORIDE 0.9 % IV SOLN
INTRAVENOUS | Status: DC
Start: 2022-05-18 — End: 2022-05-20

## 2022-05-18 MED ORDER — SODIUM CHLORIDE 0.9 % IV SOLN: 2.0000 g | Freq: Once | INTRAVENOUS | Status: DC

## 2022-05-18 MED ORDER — METRONIDAZOLE 500 MG/100ML IV SOLN
500.0000 mg | Freq: Once | INTRAVENOUS | Status: AC
Start: 2022-05-18 — End: 2022-05-18
  Administered 2022-05-18: 500 mg via INTRAVENOUS
  Filled 2022-05-18: qty 100

## 2022-05-18 MED ORDER — ACETAMINOPHEN 650 MG RE SUPP: 650.0000 mg | Freq: Four times a day (QID) | RECTAL | Status: DC | PRN

## 2022-05-18 MED ORDER — CEFEPIME HCL 2 G IV SOLR
2.0000 g | Freq: Once | INTRAVENOUS | Status: AC
  Administered 2022-05-18: 2 g via INTRAVENOUS
  Filled 2022-05-18: qty 12.5

## 2022-05-18 MED ORDER — VANCOMYCIN HCL IN DEXTROSE 1-5 GM/200ML-% IV SOLN
1000.0000 mg | Freq: Once | INTRAVENOUS | Status: AC
Start: 2022-05-18 — End: 2022-05-18
  Administered 2022-05-18: 1000 mg via INTRAVENOUS
  Filled 2022-05-18: qty 200

## 2022-05-18 MED ORDER — LORAZEPAM 2 MG/ML IJ SOLN
1.0000 mg | Freq: Once | INTRAMUSCULAR | Status: AC
  Administered 2022-05-18: 1 mg via INTRAVENOUS
  Filled 2022-05-18: qty 1

## 2022-05-18 MED ORDER — ACETAMINOPHEN 325 MG PO TABS
650.0000 mg | ORAL_TABLET | Freq: Once | ORAL | Status: AC
  Administered 2022-05-18: 650 mg via ORAL
  Filled 2022-05-18: qty 2

## 2022-05-18 MED ORDER — ONDANSETRON HCL 4 MG/2ML IJ SOLN: 4.0000 mg | Freq: Four times a day (QID) | INTRAMUSCULAR | Status: DC | PRN

## 2022-05-18 MED ORDER — IOHEXOL 300 MG/ML  SOLN
100.0000 mL | Freq: Once | INTRAMUSCULAR | Status: AC | PRN
  Administered 2022-05-18: 100 mL via INTRAVENOUS

## 2022-05-18 MED ORDER — VANCOMYCIN HCL IN DEXTROSE 1-5 GM/200ML-% IV SOLN: 1000.0000 mg | Freq: Once | INTRAVENOUS | Status: DC

## 2022-05-18 MED ORDER — METRONIDAZOLE 500 MG/100ML IV SOLN
500.0000 mg | Freq: Two times a day (BID) | INTRAVENOUS | Status: DC
  Administered 2022-05-19 – 2022-05-22 (×7): 500 mg via INTRAVENOUS
  Filled 2022-05-18 (×7): qty 100

## 2022-05-18 MED ORDER — CEFEPIME HCL 2 G IV SOLR
2.0000 g | Freq: Three times a day (TID) | INTRAVENOUS | Status: DC
  Administered 2022-05-19 – 2022-05-22 (×11): 2 g via INTRAVENOUS
  Filled 2022-05-18 (×11): qty 12.5

## 2022-05-18 MED ORDER — ACETAMINOPHEN 325 MG PO TABS
650.0000 mg | ORAL_TABLET | Freq: Four times a day (QID) | ORAL | Status: DC | PRN
  Administered 2022-05-19 – 2022-05-23 (×6): 650 mg via ORAL
  Filled 2022-05-18 (×6): qty 2

## 2022-05-18 MED ORDER — ENOXAPARIN SODIUM 40 MG/0.4ML IJ SOSY
40.0000 mg | PREFILLED_SYRINGE | INTRAMUSCULAR | Status: DC
  Administered 2022-05-18 – 2022-05-24 (×7): 40 mg via SUBCUTANEOUS
  Filled 2022-05-18 (×8): qty 0.4

## 2022-05-18 MED ORDER — ONDANSETRON HCL 4 MG PO TABS: 4.0000 mg | ORAL_TABLET | Freq: Four times a day (QID) | ORAL | Status: DC | PRN

## 2022-05-18 NOTE — H&P (Signed)
History and Physical    SAUD BAIL NIO:[id number] DOB: Oct 29, 1990 DOA: 05/18/2022  PCP: [facility] [place]  Patient coming from: Home  I have personally briefly reviewed patient's old medical records in [place]  Chief Complaint: Fever and weakness  HPI: Steve [name] is a 31 y.o. 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  According to patient, he was having fever since last 2 to 3 days with generalized weakness.  He denies any cough, chest pain, shortness of breath, any problem with bowel movements or urination.  ED Course: Upon arrival to ED, patient was febrile with 100.7, tachycardic and tachypneic but no leukocytosis.  Blood pressure was fair as well.  Patient was diagnosed with sepsis with presumed source of acute on chronic osteomyelitis of the left hip.  He received broad-spectrum antibiotics including cefepime, metronidazole and Flagyl and hospital service were called for further admission.  Of note, CT abdomen and pelvis unremarkable, x-ray of left knee was done which was unremarkable.  Chest x-ray unremarkable.  COVID-negative.   Review of Systems: As per HPI otherwise negative.    Past Medical History:  Diagnosis Date   Anxiety    Paranoid schizophrenia ([place])    Paraplegia ([place])    PTSD (post-traumatic stress disorder) Paranoid    Past Surgical History:  Procedure Laterality Date   banding procedure for morbid obesity     I & D EXTREMITY Right 02/27/2014   Procedure: IRRIGATION AND DEBRIDEMENT FOREARM AND REPAIR OF 30cm LACERATION;  Surgeon: Johnny Bridge, MD;  Location: [facility];  Service: Orthopedics;  Laterality: Right;  Anesthesia Regional with MAC   TEE WITHOUT CARDIOVERSION N/A 08/01/2021   Procedure: TRANSESOPHAGEAL ECHOCARDIOGRAM (TEE);  Surgeon: Pixie Casino, MD;  Location: [facility] ENDOSCOPY;  Service: Cardiovascular;  Laterality: N/A;     reports that he has been smoking cigarettes. He has been smoking an average of .25 packs per day. He has never used smokeless tobacco. He reports that he does not currently use alcohol. He reports current drug use. Drugs: Marijuana and Methamphetamines.  Allergies  Allergen Reactions   Caffeine Anaphylaxis   Caffeine Anaphylaxis   Chocolate Anaphylaxis   Chocolate Anaphylaxis   Sulfur    Tuberculin, Ppd     Other reaction(s): Eruption of skin   Sulfa Antibiotics Rash   Sulfa Antibiotics Rash    Family History  Problem Relation Age of Onset   Depression Mother    Bipolar disorder Father     Prior to Admission medications   Medication Sig Start Date End Date Taking? Authorizing Provider  OLANZapine (ZYPREXA) 5 MG tablet Take 1 tablet (5 mg total) by mouth at bedtime. 01/05/22 02/04/22  Donne Hazel, MD  prazosin (MINIPRESS) 2 MG capsule Take 1 capsule (2 mg total) by mouth at bedtime. Patient not taking: Reported on 09/03/2017 11/04/16 09/14/19  Lurena Nida, NP    Physical Exam: Vitals:   05/18/22 1236 05/18/22 1300 05/18/22 1400  BP: (!) 144/73 (!) 96/55   Pulse: 99 (!) 109 91  Resp: 15 (!) 23 19  Temp: 98.6 F (37 C)  (!) 100.7 F (38.2 C)  TempSrc: Oral  Rectal  SpO2: 97% 97% 97%    Constitutional: NAD, calm, comfortable Vitals:   05/18/22 1236 05/18/22 1300 05/18/22 1400  BP: (!) 144/73 (!) 96/55   Pulse: 99 (!) 109 91  Resp: 15 (!) 23 19  Temp: 98.6 F (37 C)  (!) 100.7 F (38.2 C)  TempSrc: Oral  Rectal  SpO2: 97% 97% 97%   Eyes: PERRL, lids and conjunctivae normal ENMT: Mucous membranes are moist. Posterior pharynx clear of any exudate or lesions.Normal  dentition.  Neck: normal, supple, no masses, no thyromegaly Respiratory: clear to auscultation bilaterally, no wheezing, no crackles. Normal respiratory effort. No accessory muscle use.  Cardiovascular: Regular rate and rhythm, no murmurs / rubs / gallops. No extremity edema. 2+ pedal pulses. No carotid bruits.  Abdomen: no tenderness, no masses palpated. No hepatosplenomegaly. Bowel sounds positive.  Musculoskeletal: no clubbing / cyanosis. No joint deformity upper and lower extremities. Good ROM, no contractures. Normal muscle tone.  Skin: no rashes, lesions, ulcers. No induration Neurologic: CN 2-12 grossly intact. Sensation intact, DTR normal. Strength 5/5 in all 4.  Psychiatric: Normal judgment and insight. Alert and oriented x 3. Normal mood.    Labs on Admission: I have personally reviewed following labs and imaging studies  CBC: Recent Labs  Lab 05/18/22 1305  WBC 8.6  HGB 8.0*  HCT 25.6*  MCV 82.8  PLT 662   Basic Metabolic Panel: Recent Labs  Lab 05/18/22 1305  NA 131*  K 3.9  CL 102  CO2 23  GLUCOSE 108*  BUN 14  CREATININE 0.70  CALCIUM 7.6*  MG 2.1   GFR: CrCl cannot be calculated (Unknown ideal weight.). Liver Function Tests: Recent Labs  Lab 05/18/22 1305  AST 8*  ALT 8  ALKPHOS 81  BILITOT 0.2*  PROT 7.4  ALBUMIN 2.3*   No results for input(s): "LIPASE", "AMYLASE" in the last 168 hours. No results for input(s): "AMMONIA" in the last 168 hours. Coagulation Profile: No results for input(s): "INR", "PROTIME" in the last 168 hours. Cardiac Enzymes: Recent Labs  Lab 05/18/22 1305  CKTOTAL 114   BNP (last 3 results) No results for input(s): "PROBNP" in the last 8760 hours. HbA1C: No results for input(s): "HGBA1C" in the last 72 hours. CBG: No results for input(s): "GLUCAP" in the last 168 hours. Lipid Profile: No results for input(s): "CHOL", "HDL", "LDLCALC", "TRIG", "CHOLHDL", "LDLDIRECT" in the last 72 hours. Thyroid Function Tests: No  results for input(s): "TSH", "T4TOTAL", "FREET4", "T3FREE", "THYROIDAB" in the last 72 hours. Anemia Panel: No results for input(s): "VITAMINB12", "FOLATE", "FERRITIN", "TIBC", "IRON", "RETICCTPCT" in the last 72 hours. Urine analysis:    Component Value Date/Time   COLORURINE YELLOW 01/02/2022 1356   APPEARANCEUR HAZY (A) 01/02/2022 1356   APPEARANCEUR Hazy 05/08/2013 0330   LABSPEC 1.025 01/02/2022 1356   LABSPEC 1.034 05/08/2013 0330   PHURINE 5.0 01/02/2022 1356   GLUCOSEU NEGATIVE 01/02/2022 1356   GLUCOSEU Negative 05/08/2013 0330   HGBUR SMALL (A) 01/02/2022 1356   BILIRUBINUR NEGATIVE 01/02/2022 1356   BILIRUBINUR 1+ 05/08/2013 0330   KETONESUR NEGATIVE 01/02/2022 1356   PROTEINUR 30 (A) 01/02/2022 1356   NITRITE NEGATIVE 01/02/2022 1356   LEUKOCYTESUR SMALL (A) 01/02/2022 1356   LEUKOCYTESUR Negative 05/08/2013 0330    Radiological Exams on Admission: CT ABDOMEN PELVIS W CONTRAST  Result Date: 05/18/2022 CLINICAL DATA:  Abdominal pain, weakness and fatigue. History of decubitus ulcers EXAM: CT ABDOMEN AND PELVIS WITH CONTRAST TECHNIQUE: Multidetector CT imaging of the abdomen and pelvis was performed  using the standard protocol following bolus administration of intravenous contrast. RADIATION DOSE REDUCTION: This exam was performed according to the departmental dose-optimization program which includes automated exposure control, adjustment of the mA and/or kV according to patient size and/or use of iterative reconstruction technique. CONTRAST:  134m OMNIPAQUE IOHEXOL 300 MG/ML  SOLN COMPARISON:  CT of the abdomen and pelvis on 05/30/2021 and CT of the pelvis on 01/02/2022 FINDINGS: Lower chest: No acute abnormality.  Small hiatal hernia. Hepatobiliary: Stable hepatomegaly. No overt cirrhotic morphology or hepatic masses. The gallbladder is unremarkable. No biliary ductal dilatation. Pancreas: Unremarkable. No pancreatic ductal dilatation or surrounding inflammatory changes.  Spleen: Mild splenomegaly. Adrenals/Urinary Tract: Adrenal glands are unremarkable. Kidneys are normal, without renal calculi, focal lesion, or hydronephrosis. Bladder is unremarkable. Stomach/Bowel: Bowel shows no evidence of obstruction, ileus, inflammation or lesion. The appendix is not visualized. No free intraperitoneal air. Vascular/Lymphatic: No lymphadenopathy identified. There are some visible gastric/short gastric varices which are difficult to follow. Reproductive: Prostate is unremarkable. Other: Chronic decubitus ulcers along the posterior left pelvic region and abutting the posterior proximal thigh. These appears similar to prior studies and there is evidence of chronic sclerosis of the posterior left ischium and pubic bone. Musculoskeletal: Evidence of chronic osteomyelitis of the posterior left pubic bone and ischium. IMPRESSION: 1. Small hiatal hernia. 2. Stable hepatomegaly and associated mild splenomegaly. Some degree of portal hypertension may be present as there appears to be some gastric/short gastric varices. 3. 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 Electronically Signed   By: GAletta EdouardM.D.   On: 05/18/2022 15:39   DG Knee Complete 4 Views Left  Result Date: 05/18/2022 CLINICAL DATA:  Knee injury. EXAM: LEFT KNEE - COMPLETE 4+ VIEW COMPARISON:  None Available. FINDINGS: Mild medial joint space narrowing. No joint effusion. There is a well corticated ossicle measuring up to 12 mm at the superior aspect of the tibial tubercle, possibly the sequela of remote Osgood-Schlatter disease. No acute fracture or dislocation. IMPRESSION: 1. Mild medial compartment osteoarthritis. 2. Chronic ossicle at the superior aspect of the tibial tubercle, possibly the sequela of remote Osgood-Schlatter disease. Electronically Signed   By: RYvonne KendallM.D.   On: 05/18/2022 13:31   DG Chest 2 View  Result Date: 05/18/2022 CLINICAL DATA:   Shortness of breath. EXAM: CHEST - 2 VIEW COMPARISON:  08/29/2021 FINDINGS: The lungs are clear without focal pneumonia, edema, pneumothorax or pleural effusion. The cardiopericardial silhouette is within normal limits for size. The visualized bony structures of the thorax are unremarkable. Telemetry leads overlie the chest. IMPRESSION: No active cardiopulmonary disease. Electronically Signed   By: EMisty StanleyM.D.   On: 05/18/2022 13:30    EKG: Independently reviewed.  Shows sinus tachycardia, no ST-T wave elevations.  Assessment/Plan Principal Problem:   Sepsis ([place] Active Problems:   Anxiety and depression   Chronic osteomyelitis, pelvis, left (HCC)   Chronic osteomyelitis, pelvis (HCC)   Cellulitis of left hip   Cellulitis of left knee   Chronic multifocal osteomyelitis, left femur (HCC)   Sepsis secondary to presumed left pelvic/hip cellulitis, acute on chronic left pelvic osteomyelitis, left knee cellulitis, POA: Patient met sepsis criteria based on tachycardia, tachypnea and fever.  Source at this point in time is unclear but above-mentioned already potential sources.  We will continue broad-spectrum antibiotics and follow culture.  Follow clinically as well.  UA is pending.  Depression and anxiety: Will resume olanzapine.  DVT prophylaxis: enoxaparin (LOVENOX)  injection 40 mg Start: 05/18/22 1800 Code Status: Full code Family Communication: None present at bedside.  Plan of care discussed with patient in length and he verbalized understanding and agreed with it. Disposition Plan: Potential discharge in next 1 to 2 days. Consults called: None  Darliss Cheney MD [facility]  *Please note that this is a verbal dictation therefore any spelling or grammatical errors are due to the "[place]" system interpretation.  Please page via [place] and do not message via secure chat for urgent patient care matters. Secure chat can be used for non urgent patient care  matters. 05/18/2022, 5:26 PM  To contact the attending provider between 7A-7P or the covering provider during after hours 7P-7A, please log into the web site [email or web address]

## 2022-05-18 NOTE — ED Triage Notes (Signed)
Patient brought in via ems from home. Physical therapy called for patient due to increased weakness and generalized fatigue. Patient c/o muscle spasms in legs. Hx of wounds on hips. Patient bedbound

## 2022-05-18 NOTE — Progress Notes (Signed)
A consult was received from an ED provider for cefepime and vancomycin per pharmacy dosing.  The patient's profile has been reviewed for ht/wt/allergies/indication/available labs.    No weight documented so unable to order weight based loading dose. Ht/wt order placed.   A one time order has been placed for cefepime 2 g + vancomycin 1000 mg IV once.    Further antibiotics/pharmacy consults should be ordered by admitting physician if indicated.                       Thank you, Cindi Carbon, PharmD 05/18/2022  5:06 PM

## 2022-05-18 NOTE — Progress Notes (Signed)
Pharmacy Antibiotic Note  Steve [name] is a 31 y.o. male admitted on 05/18/2022 with  fever, weakness .  PMH significant for chronic left hip decubitus ulcer, paraplegia, polysubstance abuse, MRSA bacteremia/OM. Pharmacy has been consulted for vancomycin and cefepime dosing.  Today, 05/18/22 WBC WNL Febrile TBW = 59 kg. TBW < IBW SCr WNL, but not accurate reflection of renal function given paraplegia. Will round to 1 for dosing purposes.  Plan: Cefepime 2 g IV q8h Vancomycin 1000 mg LD followed by 750 mg IV q12h for estimated AUC of 453 Goal vancomycin AUC 400-550. Check levels at steady state as needed Monitor renal function, culture data     Temp (24hrs), Avg:99.7 F (37.6 C), Min:98.6 F (37 C), Max:100.7 F (38.2 C)  Recent Labs  Lab 05/18/22 1305  WBC 8.6  CREATININE 0.70  LATICACIDVEN 0.9    CrCl cannot be calculated (Unknown ideal weight.).    Allergies  Allergen Reactions   Caffeine Anaphylaxis   Caffeine Anaphylaxis   Chocolate Anaphylaxis   Chocolate Anaphylaxis   Sulfur    Tuberculin, Ppd     Other reaction(s): Eruption of skin   Sulfa Antibiotics Rash   Sulfa Antibiotics Rash   Antimicrobials this admission: cefepime 9/1 >>  vancomycin 9/1 >>   Dose adjustments this admission:  Microbiology results: 9/1 BCx:   Cindi Carbon, PharmD 05/18/2022 6:12 PM

## 2022-05-18 NOTE — ED Provider Notes (Signed)
[facility] DEPT Provider Note   CSN: [id number] Arrival date & time: 05/18/22  1225     History  Chief Complaint  Patient presents with   Spasms   Weakness    Steve [name] is a 31 y.o. 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  Patient with decreased strength, generalized fatigue.  He denies any cough, sore throat, runny nose, reports that the fever started this morning.  He has felt more short of breath than normal.  Patient has some wounds on his left knee that he reports are from biting his knee when it is spasming because it "helps".  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  HPI     Home Medications Prior to Admission medications   Medication Sig Start Date End Date Taking? Authorizing Provider  OLANZapine (ZYPREXA) 5 MG tablet Take 1 tablet (5 mg total) by mouth at bedtime. 01/05/22 02/04/22  Donne Hazel, MD  prazosin (MINIPRESS) 2 MG capsule Take 1 capsule (2 mg total) by mouth at bedtime. Patient not taking: Reported on 09/03/2017 11/04/16 09/14/19  Lurena Nida, NP      Allergies    Caffeine; Caffeine; Chocolate; Chocolate; Sulfur; Tuberculin, ppd; Sulfa antibiotics; and Sulfa antibiotics    Review of Systems   Review of Systems  Constitutional:  Positive for chills, fatigue and fever.  Respiratory:  Positive for shortness of breath.   Skin:  Positive for wound.  Neurological:  Positive for weakness.  All other systems reviewed and are negative.   Physical Exam Updated Vital Signs BP (!) 96/55   Pulse  91   Temp (!) 100.7 F (38.2 C) (Rectal)   Resp 19   SpO2 97%  Physical Exam Vitals and nursing note reviewed.  Constitutional:      General: He is not in acute distress.    Appearance: He is ill-appearing and diaphoretic.  HENT:     Head: Normocephalic and atraumatic.  Eyes:     General:        Right eye: No discharge.        Left eye: No discharge.  Cardiovascular:     Rate and Rhythm: Normal rate and regular rhythm.     Heart sounds: No murmur heard.    No friction rub. No gallop.  Pulmonary:     Effort: Pulmonary effort is normal.     Breath sounds: Normal breath sounds.  Abdominal:     General: Bowel sounds are normal.     Palpations: Abdomen is soft.  Musculoskeletal:     Comments: Patient with contractured left leg with extension at hip, flexion at left knee which she reports is chronic compared to his baseline.  No new step-off or deformity noted.  Somewhat tender to palpation on the left hip where patient has significant ulcers.  Skin:    General: Skin is warm.     Capillary Refill: Capillary refill takes less than 2 seconds.     Coloration: Skin is pale.     Comments: Late stage decubitus ulcers noted left hip, please see picture.  Patient also with some wounds noted on  the dorsum of the right foot, patient unsure how long these have been present, surrounding redness without purulent drainage, very stages of eschar.  Patient with multiple wounds at the top of the left knee with surrounding redness without purulent drainage, patient reports these are from self-inflicted bites.  Neurological:     Mental Status: He is alert and oriented to person, place, and time.  Psychiatric:        Mood and Affect: Mood normal.        Behavior: Behavior normal.      ED Results / Procedures / Treatments   Labs (all labs ordered are listed, but only abnormal results are displayed) Labs Reviewed  CBC - Abnormal; Notable for the following components:      Result Value   RBC 3.09  (*)    Hemoglobin 8.0 (*)    HCT 25.6 (*)    MCH 25.9 (*)    RDW 15.8 (*)    All other components within normal limits  COMPREHENSIVE METABOLIC PANEL - Abnormal; Notable for the following components:   Sodium 131 (*)    Glucose, Bld 108 (*)    Calcium 7.6 (*)    Albumin 2.3 (*)    AST 8 (*)    Total Bilirubin 0.2 (*)    All other components within normal limits  RESP PANEL BY RT-PCR (FLU A&B, COVID) ARPGX2  CULTURE, BLOOD (ROUTINE X 2)  CULTURE, BLOOD (ROUTINE X 2)  LACTIC ACID, PLASMA  CK  MAGNESIUM  RAPID URINE DRUG SCREEN, HOSP PERFORMED  URINALYSIS, ROUTINE W REFLEX MICROSCOPIC  PROTIME-INR  CORTISOL-AM, BLOOD  PROCALCITONIN  MAGNESIUM    EKG EKG Interpretation  Date/Time:  Friday May 18 2022 12:37:35 EDT Ventricular Rate:  106 PR Interval:  112 QRS Duration: 89 QT Interval:  326 QTC Calculation: 433 R Axis:   58 Text Interpretation: Sinus tachycardia Since last tracing rate faster Confirmed by Linwood Dibbles [phone]) on 05/18/2022 12:43:28 PM  Radiology CT ABDOMEN PELVIS W CONTRAST  Result Date: 05/18/2022 CLINICAL DATA:  Abdominal pain, weakness and fatigue. History of decubitus ulcers EXAM: CT ABDOMEN AND PELVIS WITH CONTRAST TECHNIQUE: Multidetector CT imaging of the abdomen and pelvis was performed using the standard protocol following bolus administration of intravenous contrast. RADIATION DOSE REDUCTION: This exam was performed according to the departmental dose-optimization program which includes automated exposure control, adjustment of the mA and/or kV according to patient size and/or use of iterative reconstruction technique. CONTRAST:  OMNIPAQUE IOHEXOL 300 MG/ML  SOLN COMPARISON:  CT of the abdomen and pelvis on 05/30/2021 and CT of the pelvis on 01/02/2022 FINDINGS: Lower chest: No acute abnormality.  Small hiatal hernia. Hepatobiliary: Stable hepatomegaly. No overt cirrhotic morphology or hepatic masses. The gallbladder is unremarkable. No biliary  ductal dilatation. Pancreas: Unremarkable. No pancreatic ductal dilatation or surrounding inflammatory changes. Spleen: Mild splenomegaly. Adrenals/Urinary Tract: Adrenal glands are unremarkable. Kidneys are normal, without renal calculi, focal lesion, or hydronephrosis. Bladder is unremarkable. Stomach/Bowel: Bowel shows no evidence of obstruction, ileus, inflammation or lesion. The appendix is not visualized. No free intraperitoneal air. Vascular/Lymphatic: No lymphadenopathy identified. There are some visible gastric/short gastric varices which are difficult to follow. Reproductive: Prostate is unremarkable. Other: Chronic decubitus ulcers along the posterior left pelvic region and abutting the posterior proximal thigh. These appears similar to prior studies and there is evidence of chronic sclerosis of the posterior left ischium and pubic bone. Musculoskeletal: Evidence of chronic osteomyelitis of the posterior left pubic bone and ischium. IMPRESSION: 1.  Small hiatal hernia. 2. Stable hepatomegaly and associated mild splenomegaly. Some degree of portal hypertension may be present as there appears to be some gastric/short gastric varices. 3. 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 Electronically Signed   By: Irish Lack M.D.   On: 05/18/2022 15:39   DG Knee Complete 4 Views Left  Result Date: 05/18/2022 CLINICAL DATA:  Knee injury. EXAM: LEFT KNEE - COMPLETE 4+ VIEW COMPARISON:  None Available. FINDINGS: Mild medial joint space narrowing. No joint effusion. There is a well corticated ossicle measuring up to 12 mm at the superior aspect of the tibial tubercle, possibly the sequela of remote Osgood-Schlatter disease. No acute fracture or dislocation. IMPRESSION: 1. Mild medial compartment osteoarthritis. 2. Chronic ossicle at the superior aspect of the tibial tubercle, possibly the sequela of remote Osgood-Schlatter disease. Electronically Signed   By:  Neita Garnet M.D.   On: 05/18/2022 13:31   DG Chest 2 View  Result Date: 05/18/2022 CLINICAL DATA:  Shortness of breath. EXAM: CHEST - 2 VIEW COMPARISON:  08/29/2021 FINDINGS: The lungs are clear without focal pneumonia, edema, pneumothorax or pleural effusion. The cardiopericardial silhouette is within normal limits for size. The visualized bony structures of the thorax are unremarkable. Telemetry leads overlie the chest. IMPRESSION: No active cardiopulmonary disease. Electronically Signed   By: Kennith Center M.D.   On: 05/18/2022 13:30    Procedures .Critical Care  Performed by: Olene Floss, PA-C Authorized by: Olene Floss, PA-C   Critical care provider statement:    Critical care time (minutes):  30   Critical care was necessary to treat or prevent imminent or life-threatening deterioration of the following conditions:  Sepsis   Critical care was time spent personally by me on the following activities:  Development of treatment plan with patient or surrogate, discussions with consultants, evaluation of patient's response to treatment, examination of patient, ordering and review of laboratory studies, ordering and review of radiographic studies, ordering and performing treatments and interventions, pulse oximetry, re-evaluation of patient's condition and review of old charts   Care discussed with: admitting provider       Medications Ordered in ED Medications  lactated ringers infusion (has no administration in time range)  ceFEPIme (MAXIPIME) 2 g in sodium chloride 0.9 % 100 mL IVPB (has no administration in time range)  metroNIDAZOLE (FLAGYL) IVPB 500 mg (has no administration in time range)  vancomycin (VANCOCIN) IVPB 1000 mg/200 mL premix (has no administration in time range)  enoxaparin (LOVENOX) injection 40 mg (has no administration in time range)  0.9 %  sodium chloride infusion (has no administration in time range)  metroNIDAZOLE (FLAGYL) IVPB 500 mg (has no  administration in time range)  acetaminophen (TYLENOL) tablet 650 mg (has no administration in time range)    Or  acetaminophen (TYLENOL) suppository 650 mg (has no administration in time range)  ondansetron (ZOFRAN) tablet 4 mg (has no administration in time range)    Or  ondansetron (ZOFRAN) injection 4 mg (has no administration in time range)  OLANZapine (ZYPREXA) tablet 5 mg (has no administration in time range)  morphine (PF) 4 MG/ML injection 4 mg (4 mg Intravenous Given 05/18/22 1348)  sodium chloride 0.9 % bolus 1,000 mL (1,000 mLs Intravenous New Bag/Given 05/18/22 1348)  acetaminophen (TYLENOL) tablet 650 mg (650 mg Oral Given 05/18/22 1437)  iohexol (OMNIPAQUE) 300 MG/ML solution 100 mL (100 mLs Intravenous Contrast Given 05/18/22 1445)  LORazepam (ATIVAN) injection 1  mg (1 mg Intravenous Given 05/18/22 1502)    ED Course/ Medical Decision Making/ A&P Clinical Course as of 05/18/22 1728  Fri May 18, 2022  1648 Stable 89 YOM with baseline paralysis and chronic S4 sacral ulcer and progression to osteo of the sacrum. SIRS+ Sepsis protocol. Uncertain etiology early in the workup  [CC]    Clinical Course User Index [CC] Tretha Sciara, MD                           Medical Decision Making  This patient is a 31 y.o. male who presents to the ED for concern of generalized weakness, muscle spasms, fever, fatigue, this involves an extensive number of treatment options, and is a complaint that carries with it a high risk of complications and morbidity. The emergent differential diagnosis prior to evaluation includes, but is not limited to, sepsis due to wound infection, upper respiratory illness, skin infection, UTI, intra-abdominal infection, general wasting due to quadriplegia and inability to ambulate at baseline, electrolyte abnormality, clinically significant anemia, hyponatremia, versus other.   This is not an exhaustive differential.   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 Pertinent results include: Reviewed lab work, imaging from previous emergency department evaluations and admissions, patient with known wound on the left hip that is chronic in nature, chronic osteomyelitis noted  Physical Exam: Physical exam performed. The pertinent findings include: Patient is pale, diaphoretic, tachycardic on arrival, he does have some spasming in the lower legs versus worsening of chronic contracture, he is overall ill-appearing at baseline he has some new wounds on the top of the knee on the left as well as the dorsum of the right foot, as well as the chronic left hip seen in the photos.  Lab Tests: I ordered, and personally interpreted labs.  The pertinent results include: CMP notable for hyponatremia, sodium of 131, he is hypocalcemic with a calcium of 7.6, etiology at this time he has some low albumin at 2.3 worse than baseline likely secondary from general protein calorie malnutrition, muscle wasting.  His lactic acid, CK, and RVP are normal today.  CBC with notable anemia, hemoglobin 8.0 which is similar compared to his baseline.  No clinically significant leukocytosis or platelet dysfunction.  Unremarkable magnesium, RVP, normal lactic acid and CK today.   Imaging Studies: I ordered imaging studies including Plain film radiograph of the Left knee, chest, CT abdomen pelvis with contrast. I independently visualized and interpreted imaging which showed patient with chronic ulcers and chronic osteomyelitis noted on left hip, no significant intra abdominal abnormality other than some signs of possible portal hypertension and varices but no signs of any acute inflammatory process to explain patient's septic presentation today.  Please summary of the left knee and plain film of the chest are  overall unremarkable without acute process. I agree with the radiologist interpretation.   Cardiac Monitoring:  The patient was maintained on a cardiac monitor.  My attending physician Dr. Tomi Bamberger viewed and interpreted the cardiac monitored which showed an underlying rhythm of: sinus tachycardia. I agree with this interpretation.   Medications: I ordered medication including broad-spectrum antibiotics, fluids for SIRS/sepsis presentation.  Patient will need reevaluation of his response to treatment during his admission.  Consultations Obtained: I requested consultation with the hospitalist, spoke with Dr. Doristine Bosworth,  and  discussed lab and imaging findings as well as pertinent plan - they recommend: admission   Disposition: After consideration of the diagnostic results and the patients response to treatment, I feel that patient would benefit from admission .   I discussed this case with my attending physician Dr. Jeanell Sparrow who cosigned this note including patient's presenting symptoms, physical exam, and planned diagnostics and interventions. Attending physician stated agreement with plan or made changes to plan which were implemented.    Final Clinical Impression(s) / ED Diagnoses Final diagnoses:  None    Rx / DC Orders ED Discharge Orders     None         Anselmo Pickler, PA-C 05/18/22 1728    Pattricia Boss, MD 05/24/22 1002

## 2022-05-19 DIAGNOSIS — A419 Sepsis, unspecified organism: Secondary | ICD-10-CM | POA: Diagnosis not present

## 2022-05-19 LAB — PROTIME-INR
INR: 1.3 — ABNORMAL HIGH (ref 0.8–1.2)
Prothrombin Time: 16 seconds — ABNORMAL HIGH (ref 11.4–15.2)

## 2022-05-19 LAB — CORTISOL-AM, BLOOD: Cortisol - AM: 5.3 ug/dL — ABNORMAL LOW (ref 6.7–22.6)

## 2022-05-19 LAB — PROCALCITONIN: Procalcitonin: <0.1 ng/mL

## 2022-05-19 MED ORDER — HYDROMORPHONE HCL 1 MG/ML IJ SOLN
0.5000 mg | INTRAMUSCULAR | Status: AC | PRN
  Administered 2022-05-20 (×3): 0.5 mg via INTRAVENOUS
  Filled 2022-05-19 (×3): qty 0.5

## 2022-05-19 MED ORDER — HYDROXYZINE HCL 10 MG PO TABS
10.0000 mg | ORAL_TABLET | Freq: Three times a day (TID) | ORAL | Status: DC | PRN
  Administered 2022-05-21: 10 mg via ORAL
  Filled 2022-05-19 (×2): qty 1

## 2022-05-19 MED ORDER — OXYCODONE HCL 5 MG PO TABS
5.0000 mg | ORAL_TABLET | ORAL | Status: AC | PRN
  Administered 2022-05-19 – 2022-05-20 (×4): 5 mg via ORAL
  Filled 2022-05-19 (×4): qty 1

## 2022-05-19 MED ORDER — POLYETHYLENE GLYCOL 3350 17 G PO PACK: 17.0000 g | PACK | Freq: Every day | ORAL | Status: DC | PRN

## 2022-05-19 NOTE — Consult Note (Signed)
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 Last seen in April of this year by my associate, M. Austin. He performs self care for his wounds at home. See photos taken on admission and uploaded to EHR by EDP. Wound type:Pressure, infection Pressure Injury POA: Yes Measurement:To be obtained by bedside RN today with next dressing change and documented on Nursing Flow Sheet. Wound bed: Per photos and red, moist.  Left knee with some yellow fibrinous tissue in wound bed (inflammatory) and dried adherent serum to surrounding skin. Drainage (amount, consistency, odor) moderate to large serous Periwound: erythematous, macerated. Dressing procedure/placement/frequency: I will provide the patient with a mattress replacement with low air loss feature and bilateral pressure redistribution heel boots. Bedside RN is provided with topical care guidance via the orders for twice daily cleansing of sacral and left buttock wounds followed by the placement of saline moistened fluffed gauze topped with dry gauze and covered with ABD pads secured with paper tape. The left knee wound will be cleansed and dressed daily with antimicrobial nonadherent (xeroform) gauze topped with dry gauze and secured.  Turning and repositioning is in place (patient can perform) but Nursing will oversee.  If desired, consider consultation to surgery for input to POC.  WOC nursing team will not follow, but will remain available to this patient, the nursing and medical teams.  Please re-consult if needed.  Thank you for inviting [place] to participate in this patient's Plan of Care.  Ladona Mow, MSN, RN, CNS, GNP, Leda Min, [employer], [employer] phone:  [phone]

## 2022-05-19 NOTE — Progress Notes (Addendum)
   05/19/22 2034  Pressure Injury 01/03/22 Hip Left;Posterior;Lateral Stage 4 - Full thickness tissue loss with exposed bone, tendon or muscle. PT HYDROtherapy ONLY (greater trochanter)  Date First Assessed/Time First Assessed: 01/03/22 1406   Location: Hip  Location Orientation: Left;Posterior;Lateral  Staging: Stage 4 - Full thickness tissue loss with exposed bone, tendon or muscle.  Wound Description (Comments): PT HYDROtherapy ONL...  Wound Image   Dressing Type Gauze (Comment);ABD;Paper tape  Dressing Change Frequency Twice a day (and PRN)  State of Healing Non-healing  Wound Length (cm) 7 cm (most lateral circular wound)  Wound Width (cm) 5 cm (most lateral circular wound)  Wound Depth (cm) 4 cm (most lateral circular wound)  Wound Surface Area (cm^2) 35 cm^2  Wound Volume (cm^3) 140 cm^3  Tunneling (cm) 7 (most lateral circular wound)  Drainage Amount Moderate  Drainage Description Serosanguineous  Treatment Cleansed;Packing (Saline gauze)     Wound measurements:  Most lateral 7cm x 5cm x 4cm with 7cm tunneling  Medial 6cm x 5cm x 4cm with 6cm tunneling

## 2022-05-19 NOTE — Progress Notes (Addendum)
PROGRESS NOTE  Steve [name] WPT:[id number] DOB: March 23, 1991 DOA: 05/18/2022 PCP: [facility] [place]  HPI/Recap of past 24 hours:  Steve [name] is a 31 y.o. male with medical history significant of PMH of PTSD, depression, psychosis, polysubstance abuse disorder, chronic left hip, late stage decubitus ulcer, C-spine injury with dysplasia, paraplegia and decubitus ulcer, anxiety, depression, PTSD, schizophrenia, polysubstance use and MRSA bacteremia with pelvic osteomyelitis for which he completed antibiotic course in 08/2021 was brought into the emergency department due to fever and generalized weakness.  According to patient, he was having fever since last 2 to 3 days with generalized weakness.  He received broad-spectrum antibiotics including cefepime, metronidazole and Flagyl for sepsis secondary to presumed left pelvis/hip cellulitis, acute on chronic left pelvic osteomyelitis, left knee cellulitis, POA.  05/19/2022: The patient was seen and examined at his bedside.  He reports having neck pain.  He states is chronic.  Blood cultures are negative to date.  Seen by wound care specialist.    Assessment/Plan: Principal Problem:   Sepsis ([place]) Active Problems:   Anxiety and depression   Chronic osteomyelitis, pelvis, left (HCC)   Chronic osteomyelitis, pelvis (HCC)   Cellulitis of left hip   Cellulitis of left knee   Chronic multifocal osteomyelitis, left femur (HCC)  Sepsis secondary to presumed left pelvic/hip cellulitis, acute on chronic left pelvic osteomyelitis, left knee cellulitis, POA:  Patient met sepsis criteria based on tachycardia, tachypnea and fever.  Source at this point in time is unclear but above-mentioned already potential sources.  Continue broad-spectrum antibiotics  Continue to follow blood cultures x2 peripherally.   Monitor fever curve and WBC.  Chronic anxiety/depression: Resume home regimen.   Pending med reconciliation.  Neck pain, post C-spine  injury: Continue to closely monitor blood cultures Continue pain control  Hypovolemic hyponatremia Serum sodium 131 Continue IV fluid normal saline at 100 cc/h x 2 days.  Anemia of chronic blood loss from pressure ulcers, POA Hemoglobin 8.0 Monitor H&H Transfuse as indicated.  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    DVT prophylaxis: enoxaparin (LOVENOX) injection 40 mg Start: 05/18/22 1800 Code Status: Full code Family Communication: None at bedside. Disposition Plan: Anticipate discharge in the next 48 to 72 hours. Consults called: Wound care specialist.     Status is: Inpatient The patient requires at least 2 midnights for further evaluation and treatment of present condition.    Objective: Vitals:   05/19/22 0043 05/19/22 0442 05/19/22 0853 05/19/22 1414  BP: (!) 96/55 116/71 108/66 (!) 109/51  Pulse: 76 85 91 79  Resp: '18 18 19 17  ' Temp: 98.2 F (36.8 C) 98.2 F (36.8 C) 99.5 F (37.5 C) 98.3 F (36.8 C)  TempSrc: Oral Oral Oral Oral  SpO2: 100% 97% 98% 100%  Weight:        Intake/Output Summary (Last 24 hours) at 05/19/2022 1633 Last data filed at 05/19/2022 1050 Gross per 24 hour  Intake 344.04 ml  Output 1200 ml  Net -855.96 ml   Filed Weights   05/18/22 1953  Weight: 59 kg    Exam:  General: 31 y.o. year-old male frail-appearing in no acute distress.  Alert and oriented x3. Cardiovascular: Regular rate and rhythm with no rubs or gallops.  No thyromegaly or JVD noted.   Respiratory: Clear to auscultation with no wheezes or rales. Good inspiratory effort. Abdomen: Soft nontender nondistended with normal bowel sounds x4  quadrants. Musculoskeletal: No lower extremity edema.  Skin: Multiple pressure ulcers. Psychiatry: Mood is appropriate for condition and setting   Data Reviewed: CBC: Recent Labs  Lab 05/18/22 1305  WBC 8.6  HGB 8.0*   HCT 25.6*  MCV 82.8  PLT 573   Basic Metabolic Panel: Recent Labs  Lab 05/18/22 1305  NA 131*  K 3.9  CL 102  CO2 23  GLUCOSE 108*  BUN 14  CREATININE 0.70  CALCIUM 7.6*  MG 2.1   GFR: Estimated Creatinine Clearance: 110.6 mL/min (by C-G formula based on SCr of 0.7 mg/dL). Liver Function Tests: Recent Labs  Lab 05/18/22 1305  AST 8*  ALT 8  ALKPHOS 81  BILITOT 0.2*  PROT 7.4  ALBUMIN 2.3*   No results for input(s): "LIPASE", "AMYLASE" in the last 168 hours. No results for input(s): "AMMONIA" in the last 168 hours. Coagulation Profile: Recent Labs  Lab 05/19/22 0545  INR 1.3*   Cardiac Enzymes: Recent Labs  Lab 05/18/22 1305  CKTOTAL 114   BNP (last 3 results) No results for input(s): "PROBNP" in the last 8760 hours. HbA1C: No results for input(s): "HGBA1C" in the last 72 hours. CBG: No results for input(s): "GLUCAP" in the last 168 hours. Lipid Profile: No results for input(s): "CHOL", "HDL", "LDLCALC", "TRIG", "CHOLHDL", "LDLDIRECT" in the last 72 hours. Thyroid Function Tests: No results for input(s): "TSH", "T4TOTAL", "FREET4", "T3FREE", "THYROIDAB" in the last 72 hours. Anemia Panel: No results for input(s): "VITAMINB12", "FOLATE", "FERRITIN", "TIBC", "IRON", "RETICCTPCT" in the last 72 hours. Urine analysis:    Component Value Date/Time   COLORURINE YELLOW 05/18/2022 1448   APPEARANCEUR HAZY (A) 05/18/2022 1448   APPEARANCEUR Hazy 05/08/2013 0330   LABSPEC 1.033 (H) 05/18/2022 1448   LABSPEC 1.034 05/08/2013 0330   PHURINE 5.0 05/18/2022 1448   GLUCOSEU NEGATIVE 05/18/2022 1448   GLUCOSEU Negative 05/08/2013 0330   HGBUR SMALL (A) 05/18/2022 1448   BILIRUBINUR NEGATIVE 05/18/2022 1448   BILIRUBINUR 1+ 05/08/2013 0330   KETONESUR NEGATIVE 05/18/2022 1448   PROTEINUR 30 (A) 05/18/2022 1448   NITRITE NEGATIVE 05/18/2022 1448   LEUKOCYTESUR SMALL (A) 05/18/2022 1448   LEUKOCYTESUR Negative 05/08/2013 0330   Sepsis  Labs: '@LABRCNTIP' (procalcitonin:4,lacticidven:4)  ) Recent Results (from the past 240 hour(s))  Blood culture (routine x 2)     Status: None (Preliminary result)   Collection Time: 05/18/22  1:00 PM   Specimen: BLOOD RIGHT HAND  Result Value Ref Range Status   Specimen Description   Final    BLOOD RIGHT HAND Performed at [facility], [place] [address], [place], [place] [address]    Special Requests   Final    BOTTLES DRAWN AEROBIC AND ANAEROBIC Blood Culture adequate volume Performed at [facility] [address] [place], [place] [address]    Culture   Final    NO GROWTH < 24 HOURS Performed at [place] [address], [place], [place] [address]    Report Status PENDING  Incomplete  Blood culture (routine x 2)     Status: None (Preliminary result)   Collection Time: 05/18/22  1:05 PM   Specimen: BLOOD  Result Value Ref Range Status   Specimen Description   Final    BLOOD BLOOD LEFT FOREARM Performed at [facility] [address], [place], [place] [address]    Special Requests   Final    BOTTLES DRAWN AEROBIC AND ANAEROBIC Blood Culture adequate volume Performed at [facility], [place] [address] [place], [place] [address]    Culture   Final    NO GROWTH < 24 HOURS Performed at [facility] [address], [place], [place] [address]    Report Status PENDING  Incomplete  Resp Panel by RT-PCR (Flu A&B, Covid)     Status: None   Collection Time: 05/18/22  1:05 PM   Specimen: Nasal Swab  Result Value Ref Range Status   SARS Coronavirus 2 by RT PCR NEGATIVE NEGATIVE Final    Comment: (NOTE) SARS-CoV-2 target nucleic acids are NOT DETECTED.  The SARS-CoV-2 RNA is generally detectable in upper respiratory specimens during the acute phase of infection. The lowest concentration of SARS-CoV-2 viral copies this assay can detect is 138 copies/mL. A negative result does not preclude  SARS-Cov-2 infection and should not be used as the sole basis for treatment or other patient management decisions. A negative result may occur with  improper specimen collection/handling, submission of specimen other than nasopharyngeal swab, presence of viral mutation(s) within the areas targeted by this assay, and inadequate number of viral copies(<138 copies/mL). A negative result must be combined with clinical observations, patient history, and epidemiological information. The expected result is Negative.  Fact Sheet for Patients:  EntrepreneurPulse.com.au  Fact Sheet for Healthcare Providers:  IncredibleEmployment.be  This test is no t yet approved or cleared by the [place] FDA and  has been authorized for detection and/or diagnosis of SARS-CoV-2 by FDA under an Emergency Use Authorization (EUA). This EUA will remain  in effect (meaning this test can be used) for the duration of the COVID-19 declaration under Section 564(b)(1) of the Act, 21 U.S.C.section 360bbb-3(b)(1), unless the authorization is terminated  or revoked sooner.       Influenza A by PCR NEGATIVE NEGATIVE Final   Influenza B by PCR NEGATIVE NEGATIVE Final    Comment: (NOTE) The Xpert Xpress SARS-CoV-2/FLU/RSV plus assay is intended as an aid in the diagnosis of influenza from Nasopharyngeal swab specimens and should not be used as a sole basis for treatment. Nasal washings and aspirates are unacceptable for Xpert Xpress SARS-CoV-2/FLU/RSV testing.  Fact Sheet for Patients: EntrepreneurPulse.com.au  Fact Sheet for Healthcare Providers: IncredibleEmployment.be  This test is not yet approved or cleared by the [place] FDA and has been authorized for detection and/or diagnosis of SARS-CoV-2 by FDA under an Emergency Use Authorization (EUA). This EUA will remain in effect (meaning this test can be used) for the duration of  the COVID-19 declaration under Section 564(b)(1) of the Act, 21 U.S.C. section 360bbb-3(b)(1), unless the authorization is terminated or revoked.  Performed at [facility], [place] [address] [place], [place] [address]       Studies: No results found.  Scheduled Meds:  enoxaparin (LOVENOX) injection  40 mg Subcutaneous Q24H    Continuous Infusions:  sodium chloride 100 mL/hr at 05/18/22 2102   ceFEPime (MAXIPIME) IV 2 g (05/19/22 1053)   metronidazole 500 mg (05/19/22 0511)   vancomycin 750 mg (05/19/22 1232)     LOS: 1 day     Kayleen Memos, MD [facility] Pager [phone]  If 7PM-7AM, please contact night-coverage [email or web address] Password [facility] 05/19/2022, 4:33 PM

## 2022-05-20 DIAGNOSIS — A419 Sepsis, unspecified organism: Secondary | ICD-10-CM | POA: Diagnosis not present

## 2022-05-20 LAB — CBC WITH DIFFERENTIAL/PLATELET
Abs Immature Granulocytes: 0.03 10*3/uL (ref 0.00–0.07)
Basophils Absolute: 0 10*3/uL (ref 0.0–0.1)
Basophils Relative: 1 %
Eosinophils Absolute: 0.3 10*3/uL (ref 0.0–0.5)
Eosinophils Relative: 8 %
HCT: 22 % — ABNORMAL LOW (ref 39.0–52.0)
Hemoglobin: 6.6 g/dL — CL (ref 13.0–17.0)
Immature Granulocytes: 1 %
Lymphocytes Relative: 18 %
Lymphs Abs: 0.7 10*3/uL (ref 0.7–4.0)
MCH: 25.5 pg — ABNORMAL LOW (ref 26.0–34.0)
MCHC: 30 g/dL (ref 30.0–36.0)
MCV: 84.9 fL (ref 80.0–100.0)
Monocytes Absolute: 0.3 10*3/uL (ref 0.1–1.0)
Monocytes Relative: 6 %
Neutro Abs: 2.8 10*3/uL (ref 1.7–7.7)
Neutrophils Relative %: 66 %
Platelets: 322 10*3/uL (ref 150–400)
RBC: 2.59 MIL/uL — ABNORMAL LOW (ref 4.22–5.81)
RDW: 15.6 % — ABNORMAL HIGH (ref 11.5–15.5)
WBC: 4.2 10*3/uL (ref 4.0–10.5)
nRBC: 0 % (ref 0.0–0.2)

## 2022-05-20 LAB — PHOSPHORUS: Phosphorus: 3.8 mg/dL (ref 2.5–4.6)

## 2022-05-20 LAB — COMPREHENSIVE METABOLIC PANEL
ALT: 9 U/L (ref 0–44)
AST: 6 U/L — ABNORMAL LOW (ref 15–41)
Albumin: 1.9 g/dL — ABNORMAL LOW (ref 3.5–5.0)
Alkaline Phosphatase: 64 U/L (ref 38–126)
Anion gap: 5 (ref 5–15)
BUN: 10 mg/dL (ref 6–20)
CO2: 23 mmol/L (ref 22–32)
Calcium: 7.5 mg/dL — ABNORMAL LOW (ref 8.9–10.3)
Chloride: 108 mmol/L (ref 98–111)
Creatinine, Ser: 0.65 mg/dL (ref 0.61–1.24)
GFR, Estimated: >60 mL/min (ref >60)
Glucose, Bld: 113 mg/dL — ABNORMAL HIGH (ref 70–99)
Potassium: 3.6 mmol/L (ref 3.5–5.1)
Sodium: 136 mmol/L (ref 135–145)
Total Bilirubin: 0.2 mg/dL — ABNORMAL LOW (ref 0.3–1.2)
Total Protein: 6.3 g/dL — ABNORMAL LOW (ref 6.5–8.1)

## 2022-05-20 LAB — PREPARE RBC (CROSSMATCH)

## 2022-05-20 LAB — MAGNESIUM: Magnesium: 1.8 mg/dL (ref 1.7–2.4)

## 2022-05-20 MED ORDER — SERTRALINE HCL 25 MG PO TABS
25.0000 mg | ORAL_TABLET | Freq: Every day | ORAL | Status: DC
  Administered 2022-05-20 – 2022-05-25 (×6): 25 mg via ORAL
  Filled 2022-05-20 (×6): qty 1

## 2022-05-20 MED ORDER — TIZANIDINE HCL 4 MG PO TABS
4.0000 mg | ORAL_TABLET | Freq: Every evening | ORAL | Status: DC | PRN
  Administered 2022-05-20 – 2022-05-24 (×5): 4 mg via ORAL
  Filled 2022-05-20 (×6): qty 1

## 2022-05-20 MED ORDER — PANTOPRAZOLE SODIUM 40 MG PO TBEC
40.0000 mg | DELAYED_RELEASE_TABLET | Freq: Every day | ORAL | Status: DC
  Administered 2022-05-20 – 2022-05-25 (×6): 40 mg via ORAL
  Filled 2022-05-20 (×6): qty 1

## 2022-05-20 MED ORDER — PREGABALIN 75 MG PO CAPS
150.0000 mg | ORAL_CAPSULE | Freq: Two times a day (BID) | ORAL | Status: DC
  Administered 2022-05-20 – 2022-05-25 (×10): 150 mg via ORAL
  Filled 2022-05-20 (×10): qty 2

## 2022-05-20 MED ORDER — SIMETHICONE 80 MG PO CHEW
80.0000 mg | CHEWABLE_TABLET | Freq: Once | ORAL | Status: AC
  Administered 2022-05-20: 80 mg via ORAL
  Filled 2022-05-20: qty 1

## 2022-05-20 MED ORDER — CALCIUM CARBONATE 1250 (500 CA) MG PO TABS
1250.0000 mg | ORAL_TABLET | Freq: Every day | ORAL | Status: DC
  Administered 2022-05-20 – 2022-05-21 (×2): 1250 mg via ORAL
  Filled 2022-05-20 (×3): qty 1

## 2022-05-20 MED ORDER — MIDODRINE HCL 5 MG PO TABS
5.0000 mg | ORAL_TABLET | Freq: Two times a day (BID) | ORAL | Status: DC
  Administered 2022-05-20 – 2022-05-25 (×10): 5 mg via ORAL
  Filled 2022-05-20 (×11): qty 1

## 2022-05-20 MED ORDER — SODIUM CHLORIDE 0.9% IV SOLUTION
Freq: Once | INTRAVENOUS | Status: AC
  Administered 2022-05-20: 250 mL via INTRAVENOUS

## 2022-05-20 MED ORDER — ARIPIPRAZOLE 10 MG PO TABS
15.0000 mg | ORAL_TABLET | Freq: Every day | ORAL | Status: DC
  Administered 2022-05-20 – 2022-05-25 (×6): 15 mg via ORAL
  Filled 2022-05-20 (×6): qty 2

## 2022-05-20 MED ORDER — CALCIUM CARBONATE ANTACID 500 MG PO CHEW
500.0000 mg | CHEWABLE_TABLET | Freq: Two times a day (BID) | ORAL | Status: DC | PRN
  Administered 2022-05-20: 500 mg via ORAL
  Filled 2022-05-20: qty 3

## 2022-05-20 MED ORDER — MIDODRINE HCL 5 MG PO TABS: 5.0000 mg | ORAL_TABLET | Freq: Two times a day (BID) | ORAL | Status: DC | PRN

## 2022-05-20 MED ORDER — SODIUM CHLORIDE 0.9 % IV SOLN
INTRAVENOUS | Status: DC
  Administered 2022-05-20: 1000 mL via INTRAVENOUS

## 2022-05-20 MED ORDER — HYDROMORPHONE HCL 1 MG/ML IJ SOLN
0.5000 mg | INTRAMUSCULAR | Status: DC | PRN
  Administered 2022-05-20 – 2022-05-22 (×4): 0.5 mg via INTRAVENOUS
  Filled 2022-05-20 (×4): qty 0.5

## 2022-05-20 MED ORDER — TRAZODONE HCL 50 MG PO TABS
50.0000 mg | ORAL_TABLET | Freq: Every evening | ORAL | Status: DC | PRN
  Administered 2022-05-20 – 2022-05-22 (×2): 50 mg via ORAL
  Filled 2022-05-20 (×3): qty 1

## 2022-05-20 MED ORDER — OXYCODONE HCL 5 MG PO TABS
5.0000 mg | ORAL_TABLET | ORAL | Status: AC | PRN
  Administered 2022-05-20 – 2022-05-22 (×6): 5 mg via ORAL
  Filled 2022-05-20 (×6): qty 1

## 2022-05-20 MED ORDER — ALUM & MAG HYDROXIDE-SIMETH 200-200-20 MG/5ML PO SUSP
15.0000 mL | ORAL | Status: DC | PRN
  Administered 2022-05-20 (×2): 15 mL via ORAL
  Filled 2022-05-20 (×2): qty 30

## 2022-05-20 MED ORDER — SENNA 8.6 MG PO TABS
1.0000 | ORAL_TABLET | Freq: Every day | ORAL | Status: DC
  Administered 2022-05-20 – 2022-05-24 (×5): 8.6 mg via ORAL
  Filled 2022-05-20 (×6): qty 1

## 2022-05-20 NOTE — Progress Notes (Signed)
Dr. Margo Aye notified of Hemoglobin 6.6

## 2022-05-20 NOTE — Progress Notes (Addendum)
PROGRESS NOTE  Steve [name] SJG:[id number] DOB: [birth year] DOA: 05/18/2022 PCP: [facility] [place]  HPI/Recap of past 24 hours:  Steve [name] is a 31 y.o. male with medical history significant for PTSD, chronic anxiety/depression/psychosis, schizophrenia, polysubstance abuse disorder, chronic left hip late stage decubitus ulcer, C-spine injury, paraplegia, and MRSA bacteremia with pelvic osteomyelitis for which he completed antibiotic course in 08/2021, who was brought into the emergency department due to fever and generalized weakness.  According to the patient, he was having fevers for 2 to 3 days with generalized weakness.  Upon presentation, he received broad-spectrum IV antibiotics including cefepime, IV Flagyl and IV vancomycin for sepsis secondary to left pelvis/hip cellulitis, left knee cellulitis, POA.  Wound care specialist was consulted to assist with local wound care.  05/20/2022: The patient was seen and examined at his bedside.  He has no new complaints.  Pain management in place with bowel regimen.    Assessment/Plan: Principal Problem:   Sepsis ([place]) Active Problems:   Anxiety and depression   Chronic osteomyelitis, pelvis, left (HCC)   Chronic osteomyelitis, pelvis (HCC)   Cellulitis of left hip   Cellulitis of left knee   Chronic multifocal osteomyelitis, left femur (HCC)  Sepsis secondary to left pelvic/hip cellulitis, left knee cellulitis, POA:  Patient met sepsis criteria based on tachycardia, tachypnea and fever.  Continue broad-spectrum IV antibiotics  Continue to follow blood cultures x2 peripherally.  Negative to date. Monitor fever curve and WBC. Pain management in place with bowel regimen.  Acute on chronic blood loss anemia in the setting of multiple pressure ulcers Hemoglobin drop 6.6 this morning from 8.0 2 units PRBC ordered to be transfused Repeat CBC posttransfusion  Hypocalcemia Repleted with calcium carbonate 1250 mg daily x3  days Continue to monitor electrolytes and replete as indicated.  Chronic anxiety/depression/psychosis/schizophrenia: Resume home regimen.    Neck pain, post C-spine injury: Continue pain control. Continue to closely monitor blood cultures Blood cultures are negative to date.  Resolved post repletion: Hypovolemic hyponatremia Serum sodium 131>> 136. Continue IV fluid normal saline at 50 cc/h x 2 days.  Chronic hypotension Resume home midodrine 5 mg twice daily Maintain MAP greater than 65.  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    DVT prophylaxis: enoxaparin (LOVENOX) injection 40 mg Start: 05/18/22 1800 Code Status: Full code Family Communication: None at bedside. Disposition Plan: Anticipate discharge in the next 48 to 72 hours. Consults called: Wound care specialist.     Status is: Inpatient The patient requires at least 2 midnights for further evaluation and treatment of present condition.    Objective: Vitals:   05/19/22 2218 05/20/22 0534 05/20/22 1315 05/20/22 1348  BP: 127/73 (!) 101/50 (!) 85/52 (!) 94/50  Pulse: 71 87 83 76  Resp: '18 18  15  ' Temp: 97.6 F (36.4 C) 98.6 F (37 C) 98.4 F (36.9 C) 98.5 F (36.9 C)  TempSrc: Oral Oral Oral Oral  SpO2: 100% 100% 99% 99%  Weight:        Intake/Output Summary (Last 24 hours) at 05/20/2022 1605 Last data filed at 05/20/2022 1348 Gross per 24 hour  Intake 1727 ml  Output 1400 ml  Net 327 ml   Filed Weights   05/18/22 1953  Weight: 59 kg    Exam:  General: 31 y.o. year-old male frail-appearing in no acute distress.  He is alert and oriented x3.   Cardiovascular:  Regular rate and rhythm no rubs or gallops.   Respiratory: Clear to auscultation no wheezes or rales. Abdomen: Soft nontender bowel sounds present.   Musculoskeletal: No lower extremity edema bilaterally. Skin: Multiple pressure  ulcers Psychiatry: Mood is appropriate for condition and setting.   Data Reviewed: CBC: Recent Labs  Lab 05/18/22 1305 05/20/22 0634  WBC 8.6 4.2  NEUTROABS  --  2.8  HGB 8.0* 6.6*  HCT 25.6* 22.0*  MCV 82.8 84.9  PLT 370 409   Basic Metabolic Panel: Recent Labs  Lab 05/18/22 1305 05/20/22 0634  NA 131* 136  K 3.9 3.6  CL 102 108  CO2 23 23  GLUCOSE 108* 113*  BUN 14 10  CREATININE 0.70 0.65  CALCIUM 7.6* 7.5*  MG 2.1 1.8  PHOS  --  3.8   GFR: Estimated Creatinine Clearance: 110.6 mL/min (by C-G formula based on SCr of 0.65 mg/dL). Liver Function Tests: Recent Labs  Lab 05/18/22 1305 05/20/22 0634  AST 8* 6*  ALT 8 9  ALKPHOS 81 64  BILITOT 0.2* 0.2*  PROT 7.4 6.3*  ALBUMIN 2.3* 1.9*   No results for input(s): "LIPASE", "AMYLASE" in the last 168 hours. No results for input(s): "AMMONIA" in the last 168 hours. Coagulation Profile: Recent Labs  Lab 05/19/22 0545  INR 1.3*   Cardiac Enzymes: Recent Labs  Lab 05/18/22 1305  CKTOTAL 114   BNP (last 3 results) No results for input(s): "PROBNP" in the last 8760 hours. HbA1C: No results for input(s): "HGBA1C" in the last 72 hours. CBG: No results for input(s): "GLUCAP" in the last 168 hours. Lipid Profile: No results for input(s): "CHOL", "HDL", "LDLCALC", "TRIG", "CHOLHDL", "LDLDIRECT" in the last 72 hours. Thyroid Function Tests: No results for input(s): "TSH", "T4TOTAL", "FREET4", "T3FREE", "THYROIDAB" in the last 72 hours. Anemia Panel: No results for input(s): "VITAMINB12", "FOLATE", "FERRITIN", "TIBC", "IRON", "RETICCTPCT" in the last 72 hours. Urine analysis:    Component Value Date/Time   COLORURINE YELLOW 05/18/2022 1448   APPEARANCEUR HAZY (A) 05/18/2022 1448   APPEARANCEUR Hazy 05/08/2013 0330   LABSPEC 1.033 (H) 05/18/2022 1448   LABSPEC 1.034 05/08/2013 0330   PHURINE 5.0 05/18/2022 1448   GLUCOSEU NEGATIVE 05/18/2022 1448   GLUCOSEU Negative 05/08/2013 0330   HGBUR SMALL (A)  05/18/2022 1448   BILIRUBINUR NEGATIVE 05/18/2022 1448   BILIRUBINUR 1+ 05/08/2013 0330   KETONESUR NEGATIVE 05/18/2022 1448   PROTEINUR 30 (A) 05/18/2022 1448   NITRITE NEGATIVE 05/18/2022 1448   LEUKOCYTESUR SMALL (A) 05/18/2022 1448   LEUKOCYTESUR Negative 05/08/2013 0330   Sepsis Labs: '@LABRCNTIP' (procalcitonin:4,lacticidven:4)  ) Recent Results (from the past 240 hour(s))  Blood culture (routine x 2)     Status: None (Preliminary result)   Collection Time: 05/18/22  1:00 PM   Specimen: BLOOD RIGHT HAND  Result Value Ref Range Status   Specimen Description   Final    BLOOD RIGHT HAND Performed at [facility], [place] [address], [place], [place] [address]    Special Requests   Final    BOTTLES DRAWN AEROBIC AND ANAEROBIC Blood Culture adequate volume Performed at [facility] [address] [place], [place] [address]    Culture   Final    NO GROWTH 2 DAYS Performed at [facility] [address], [place], [place] [address]    Report Status PENDING  Incomplete  Blood culture (routine x 2)     Status: None (Preliminary result)   Collection Time: 05/18/22  1:05  PM   Specimen: BLOOD  Result Value Ref Range Status   Specimen Description   Final    BLOOD BLOOD LEFT FOREARM Performed at [facility] [address], [place], [place] [address]    Special Requests   Final    BOTTLES DRAWN AEROBIC AND ANAEROBIC Blood Culture adequate volume Performed at [facility] [address], [place], [place] [address]    Culture   Final    NO GROWTH 2 DAYS Performed at [place] [address] [place], [place] [address]    Report Status PENDING  Incomplete  Resp Panel by RT-PCR (Flu A&B, Covid)     Status: None   Collection Time: 05/18/22  1:05 PM   Specimen: Nasal Swab  Result Value Ref Range Status   SARS Coronavirus 2 by RT PCR NEGATIVE NEGATIVE Final    Comment: (NOTE) SARS-CoV-2  target nucleic acids are NOT DETECTED.  The SARS-CoV-2 RNA is generally detectable in upper respiratory specimens during the acute phase of infection. The lowest concentration of SARS-CoV-2 viral copies this assay can detect is 138 copies/mL. A negative result does not preclude SARS-Cov-2 infection and should not be used as the sole basis for treatment or other patient management decisions. A negative result may occur with  improper specimen collection/handling, submission of specimen other than nasopharyngeal swab, presence of viral mutation(s) within the areas targeted by this assay, and inadequate number of viral copies(<138 copies/mL). A negative result must be combined with clinical observations, patient history, and epidemiological information. The expected result is Negative.  Fact Sheet for Patients:  EntrepreneurPulse.com.au  Fact Sheet for Healthcare Providers:  IncredibleEmployment.be  This test is no t yet approved or cleared by the [place] FDA and  has been authorized for detection and/or diagnosis of SARS-CoV-2 by FDA under an Emergency Use Authorization (EUA). This EUA will remain  in effect (meaning this test can be used) for the duration of the COVID-19 declaration under Section 564(b)(1) of the Act, 21 U.S.C.section 360bbb-3(b)(1), unless the authorization is terminated  or revoked sooner.       Influenza A by PCR NEGATIVE NEGATIVE Final   Influenza B by PCR NEGATIVE NEGATIVE Final    Comment: (NOTE) The Xpert Xpress SARS-CoV-2/FLU/RSV plus assay is intended as an aid in the diagnosis of influenza from Nasopharyngeal swab specimens and should not be used as a sole basis for treatment. Nasal washings and aspirates are unacceptable for Xpert Xpress SARS-CoV-2/FLU/RSV testing.  Fact Sheet for Patients: EntrepreneurPulse.com.au  Fact Sheet for Healthcare  Providers: IncredibleEmployment.be  This test is not yet approved or cleared by the [place] FDA and has been authorized for detection and/or diagnosis of SARS-CoV-2 by FDA under an Emergency Use Authorization (EUA). This EUA will remain in effect (meaning this test can be used) for the duration of the COVID-19 declaration under Section 564(b)(1) of the Act, 21 U.S.C. section 360bbb-3(b)(1), unless the authorization is terminated or revoked.  Performed at [facility], [place] [address], [place], [place] [address]       Studies: No results found.  Scheduled Meds:  sodium chloride   Intravenous Once   enoxaparin (LOVENOX) injection  40 mg Subcutaneous Q24H    Continuous Infusions:  sodium chloride 75 mL/hr at 05/19/22 1845   ceFEPime (MAXIPIME) IV 2 g (05/20/22 1042)   metronidazole 500 mg (05/20/22 0536)   vancomycin 750 mg (05/20/22 1136)     LOS: 2 days  Kayleen Memos, MD [facility] Pager [phone]  If 7PM-7AM, please contact night-coverage [email or web address] Password [email or web address] 05/20/2022, 4:05 PM

## 2022-05-21 DIAGNOSIS — F419 Anxiety disorder, unspecified: Secondary | ICD-10-CM | POA: Diagnosis not present

## 2022-05-21 DIAGNOSIS — A419 Sepsis, unspecified organism: Secondary | ICD-10-CM | POA: Diagnosis not present

## 2022-05-21 DIAGNOSIS — L03116 Cellulitis of left lower limb: Secondary | ICD-10-CM

## 2022-05-21 DIAGNOSIS — F32A Depression, unspecified: Secondary | ICD-10-CM | POA: Diagnosis not present

## 2022-05-21 LAB — TYPE AND SCREEN
ABO/RH(D): A POS
Antibody Screen: NEGATIVE
Unit division: 0
Unit division: 0

## 2022-05-21 LAB — CBC WITH DIFFERENTIAL/PLATELET
Abs Immature Granulocytes: 0.02 10*3/uL (ref 0.00–0.07)
Basophils Absolute: 0 10*3/uL (ref 0.0–0.1)
Basophils Relative: 1 %
Eosinophils Absolute: 0.4 10*3/uL (ref 0.0–0.5)
Eosinophils Relative: 10 %
HCT: 26.4 % — ABNORMAL LOW (ref 39.0–52.0)
Hemoglobin: 8.1 g/dL — ABNORMAL LOW (ref 13.0–17.0)
Immature Granulocytes: 1 %
Lymphocytes Relative: 32 %
Lymphs Abs: 1.1 10*3/uL (ref 0.7–4.0)
MCH: 25.7 pg — ABNORMAL LOW (ref 26.0–34.0)
MCHC: 30.7 g/dL (ref 30.0–36.0)
MCV: 83.8 fL (ref 80.0–100.0)
Monocytes Absolute: 0.2 10*3/uL (ref 0.1–1.0)
Monocytes Relative: 7 %
Neutro Abs: 1.7 10*3/uL (ref 1.7–7.7)
Neutrophils Relative %: 49 %
Platelets: 318 10*3/uL (ref 150–400)
RBC: 3.15 MIL/uL — ABNORMAL LOW (ref 4.22–5.81)
RDW: 15.7 % — ABNORMAL HIGH (ref 11.5–15.5)
WBC: 3.5 10*3/uL — ABNORMAL LOW (ref 4.0–10.5)
nRBC: 0 % (ref 0.0–0.2)

## 2022-05-21 LAB — BASIC METABOLIC PANEL
Anion gap: 5 (ref 5–15)
BUN: 8 mg/dL (ref 6–20)
CO2: 25 mmol/L (ref 22–32)
Calcium: 8.2 mg/dL — ABNORMAL LOW (ref 8.9–10.3)
Chloride: 105 mmol/L (ref 98–111)
Creatinine, Ser: 0.65 mg/dL (ref 0.61–1.24)
GFR, Estimated: >60 mL/min (ref >60)
Glucose, Bld: 91 mg/dL (ref 70–99)
Potassium: 3.9 mmol/L (ref 3.5–5.1)
Sodium: 135 mmol/L (ref 135–145)

## 2022-05-21 LAB — BPAM RBC
Blood Product Expiration Date: 202309252359
Blood Product Expiration Date: 202309252359
ISSUE DATE / TIME: 202309031310
ISSUE DATE / TIME: 202309031640
Unit Type and Rh: 6200
Unit Type and Rh: 6200

## 2022-05-21 NOTE — Progress Notes (Signed)
PROGRESS NOTE    Steve [name]  WCH:[id number] DOB: 1990/12/21 DOA: 05/18/2022 PCP: [facility]    Brief Narrative:  31 year old with history of PTSD, chronic anxiety/depression/psychosis, schizophrenia, polysubstance abuse disorder, chronic left hip decubitus ulcer, C-spine injury and paraplegia, chronic indwelling Foley catheter, previous history of MRSA bacteremia and pelvic osteomyelitis brought to the emergency room with fever, generalized weakness.  Admitted with infected decubitus ulcer.  Patient complains of running out of pain medications.   Assessment & Plan:   Sepsis secondary to left pelvic/hip cellulitis, left knee cellulitis present on admission: Presented with tachycardia tachypnea and fever. Blood was negative so far. Remains on broad-spectrum IV antibiotics.  Pain management, bowel regimen, muscle relaxants. Local wound care as formulated by wound care team. No indication for surgical debridement.  Acute blood loss anemia in the setting of multiple pressure ulcers: Hemoglobin 6.6.  Received 2 units of PRBC with appropriate response.  Chronic anxiety/depression and psychosis: Patient on multiple medications including Abilify, Lyrica, Zoloft.  C-spine injury with paraplegia: Symptomatic treatment.  Pain control.  Spasm controlled.  Personal care.  Continue IV antibiotics today.  Attempted to call patient's mother who stated that patient's wife should be contact person.  No phone number available.  We will find out safe disposition plan.   DVT prophylaxis: enoxaparin (LOVENOX) injection 40 mg Start: 05/18/22 1800   Code Status: Full code Family Communication: None Disposition Plan: Status is: Inpatient Remains inpatient appropriate because: IV antibiotics     Consultants:  Wound care  Procedures:  None  Antimicrobials:  Vancomycin, Flagyl and cefepime 9/1---   Subjective: Patient seen and examined.  He tells me that he has some spasms and  pain on his legs.  Patient was able to maneuver and show me his left buttock wounds.  It hurts all over.  Remains afebrile.  On room air.  Objective: Vitals:   05/20/22 1649 05/20/22 1719 05/20/22 2003 05/21/22 0543  BP: 134/65 (!) 100/57 116/72 109/62  Pulse: 63 85 (!) 56 (!) 55  Resp: 14  18 18   Temp: 98.3 F (36.8 C) 98.5 F (36.9 C) 98.4 F (36.9 C) 97.8 F (36.6 C)  TempSrc: Oral Oral Oral Oral  SpO2: 99% 99% 98% 99%  Weight:        Intake/Output Summary (Last 24 hours) at 05/21/2022 1429 Last data filed at 05/21/2022 1009 Gross per 24 hour  Intake 3163.3 ml  Output 1250 ml  Net 1913.3 ml   Filed Weights   05/18/22 1953  Weight: 59 kg    Examination:  General: Frail and debilitated.  Chronically sick looking.  Alert oriented x3. Cardiovascular: Regular rate rhythm.  No murmurs. Respiratory: Bilateral clear Gastrointestinal: Soft.  Nontender.  Bowel sound present. Ext:  Stage IV sacral decubitus ulcer, to incise with undermining margins on the left buttock. Superficial pigmentation and ulceration of the left knee. Neuro: Paraplegic, unable to move lower extremity independently. 2+ edema on the dorsum of the foot left more than right.      Data Reviewed: I have personally reviewed following labs and imaging studies  CBC: Recent Labs  Lab 05/18/22 1305 05/20/22 0634 05/21/22 0446  WBC 8.6 4.2 3.5*  NEUTROABS  --  2.8 1.7  HGB 8.0* 6.6* 8.1*  HCT 25.6* 22.0* 26.4*  MCV 82.8 84.9 83.8  PLT 370 322 318   Basic Metabolic Panel: Recent Labs  Lab 05/18/22 1305 05/20/22 0634 05/21/22 0446  NA 131* 136 135  K 3.9 3.6 3.9  CL 102 108 105  CO2 23 23 25   GLUCOSE 108* 113* 91  BUN 14 10 8   CREATININE 0.70 0.65 0.65  CALCIUM 7.6* 7.5* 8.2*  MG 2.1 1.8  --   PHOS  --  3.8  --    GFR: Estimated Creatinine Clearance: 110.6 mL/min (by C-G formula based on SCr of 0.65 mg/dL). Liver Function Tests: Recent Labs  Lab 05/18/22 1305 05/20/22 0634  AST 8* 6*   ALT 8 9  ALKPHOS 81 64  BILITOT 0.2* 0.2*  PROT 7.4 6.3*  ALBUMIN 2.3* 1.9*   No results for input(s): "LIPASE", "AMYLASE" in the last 168 hours. No results for input(s): "AMMONIA" in the last 168 hours. Coagulation Profile: Recent Labs  Lab 05/19/22 0545  INR 1.3*   Cardiac Enzymes: Recent Labs  Lab 05/18/22 1305  CKTOTAL 114   BNP (last 3 results) No results for input(s): "PROBNP" in the last 8760 hours. HbA1C: No results for input(s): "HGBA1C" in the last 72 hours. CBG: No results for input(s): "GLUCAP" in the last 168 hours. Lipid Profile: No results for input(s): "CHOL", "HDL", "LDLCALC", "TRIG", "CHOLHDL", "LDLDIRECT" in the last 72 hours. Thyroid Function Tests: No results for input(s): "TSH", "T4TOTAL", "FREET4", "T3FREE", "THYROIDAB" in the last 72 hours. Anemia Panel: No results for input(s): "VITAMINB12", "FOLATE", "FERRITIN", "TIBC", "IRON", "RETICCTPCT" in the last 72 hours. Sepsis Labs: Recent Labs  Lab 05/18/22 1305 05/19/22 0545  PROCALCITON  --  <0.10  LATICACIDVEN 0.9  --     Recent Results (from the past 240 hour(s))  Blood culture (routine x 2)     Status: None (Preliminary result)   Collection Time: 05/18/22  1:00 PM   Specimen: BLOOD RIGHT HAND  Result Value Ref Range Status   Specimen Description   Final    BLOOD RIGHT HAND Performed at [facility], [address] [place], [place]    Special Requests   Final    BOTTLES DRAWN AEROBIC AND ANAEROBIC Blood Culture adequate volume Performed at [facility], [address], [place], [place]    Culture   Final    NO GROWTH 3 DAYS Performed at [facility], [address] [place], [address] [place]    Report Status PENDING  Incomplete  Blood culture (routine x 2)     Status: None (Preliminary result)   Collection Time: 05/18/22  1:05 PM   Specimen: BLOOD  Result Value Ref Range Status   Specimen Description   Final     BLOOD BLOOD LEFT FOREARM Performed at [facility], [address] [place], [place]    Special Requests   Final    BOTTLES DRAWN AEROBIC AND ANAEROBIC Blood Culture adequate volume Performed at [facility], [address], [place], [place]    Culture   Final    NO GROWTH 3 DAYS Performed at [facility], [address], [place], [address] [place]    Report Status PENDING  Incomplete  Resp Panel by RT-PCR (Flu A&B, Covid)     Status: None   Collection Time: 05/18/22  1:05 PM   Specimen: Nasal Swab  Result Value Ref Range Status   SARS Coronavirus 2 by RT PCR NEGATIVE NEGATIVE Final    Comment: (NOTE) SARS-CoV-2 target nucleic acids are NOT DETECTED.  The SARS-CoV-2 RNA is generally detectable in upper respiratory specimens during the acute phase of infection. The lowest concentration of SARS-CoV-2  viral copies this assay can detect is 138 copies/mL. A negative result does not preclude SARS-Cov-2 infection and should not be used as the sole basis for treatment or other patient management decisions. A negative result may occur with  improper specimen collection/handling, submission of specimen other than nasopharyngeal swab, presence of viral mutation(s) within the areas targeted by this assay, and inadequate number of viral copies(<138 copies/mL). A negative result must be combined with clinical observations, patient history, and epidemiological information. The expected result is Negative.  Fact Sheet for Patients:  EntrepreneurPulse.com.au  Fact Sheet for Healthcare Providers:  IncredibleEmployment.be  This test is no t yet approved or cleared by the [place] FDA and  has been authorized for detection and/or diagnosis of SARS-CoV-2 by FDA under an Emergency Use Authorization (EUA). This EUA will remain  in effect (meaning this test can be used) for the duration of  the COVID-19 declaration under Section 564(b)(1) of the Act, 21 U.S.C.section 360bbb-3(b)(1), unless the authorization is terminated  or revoked sooner.       Influenza A by PCR NEGATIVE NEGATIVE Final   Influenza B by PCR NEGATIVE NEGATIVE Final    Comment: (NOTE) The Xpert Xpress SARS-CoV-2/FLU/RSV plus assay is intended as an aid in the diagnosis of influenza from Nasopharyngeal swab specimens and should not be used as a sole basis for treatment. Nasal washings and aspirates are unacceptable for Xpert Xpress SARS-CoV-2/FLU/RSV testing.  Fact Sheet for Patients: EntrepreneurPulse.com.au  Fact Sheet for Healthcare Providers: IncredibleEmployment.be  This test is not yet approved or cleared by the [place] FDA and has been authorized for detection and/or diagnosis of SARS-CoV-2 by FDA under an Emergency Use Authorization (EUA). This EUA will remain in effect (meaning this test can be used) for the duration of the COVID-19 declaration under Section 564(b)(1) of the Act, 21 U.S.C. section 360bbb-3(b)(1), unless the authorization is terminated or revoked.  Performed at [facility], [place] [address] [place], [place] [address]          Radiology Studies: No results found.      Scheduled Meds:  ARIPiprazole  15 mg Oral Daily   calcium carbonate  1,250 mg Oral Q breakfast   enoxaparin (LOVENOX) injection  40 mg Subcutaneous Q24H   midodrine  5 mg Oral BID WC   pantoprazole  40 mg Oral Daily   pregabalin  150 mg Oral BID   senna  1 tablet Oral Daily   sertraline  25 mg Oral Daily   Continuous Infusions:  sodium chloride 50 mL/hr at 05/21/22 0427   ceFEPime (MAXIPIME) IV 2 g (05/21/22 1109)   metronidazole 500 mg (05/21/22 0538)   vancomycin 750 mg (05/21/22 0950)     LOS: 3 days    Time spent: 35 minutes    Barb Merino, MD [facility] Pager [phone]

## 2022-05-21 NOTE — Progress Notes (Signed)
Pharmacy Antibiotic Note  Steve [name] is a 31 y.o. male admitted on 05/18/2022 with  fever, weakness .  PMH significant for chronic left hip decubitus ulcer, paraplegia, polysubstance abuse, MRSA bacteremia/OM. Pharmacy has been consulted for vancomycin and cefepime dosing.  Day #4 Vanc/Cefepime/Flagyl WBC low 3.5 Afebrile TBW = 59 kg. TBW < IBW SCr WNL, but not accurate reflection of renal function given paraplegia. Will round to 1 for dosing purposes.  Plan: Continue Cefepime 2 g IV q8h / Flagyl 500mg  IV q12h Continue Vancomycin 750 mg IV q12h for estimated AUC of 453 Goal vancomycin AUC 400-550. If plan is to continue abx, will need to check levels in the next 48hr Monitor renal function, culture data  Weight: 59 kg (130 lb)  Temp (24hrs), Avg:98.2 F (36.8 C), Min:97.8 F (36.6 C), Max:98.5 F (36.9 C)  Recent Labs  Lab 05/18/22 1305 05/20/22 0634 05/21/22 0446  WBC 8.6 4.2 3.5*  CREATININE 0.70 0.65 0.65  LATICACIDVEN 0.9  --   --      Estimated Creatinine Clearance: 110.6 mL/min (by C-G formula based on SCr of 0.65 mg/dL).    Allergies  Allergen Reactions   Caffeine Anaphylaxis   Chocolate Anaphylaxis   Tuberculin, Ppd     Other reaction(s): Eruption of skin   Sulfa Antibiotics Rash   Antimicrobials this admission: cefepime 9/1 >>  vancomycin 9/1 >>  Flagyl 9/1 >>  Dose adjustments this admission:  Microbiology results: 9/1 BCx: ngtd  11/1, PharmD, BCPS Pharmacy: [phone] 05/21/2022 7:39 AM

## 2022-05-22 DIAGNOSIS — A419 Sepsis, unspecified organism: Secondary | ICD-10-CM | POA: Diagnosis not present

## 2022-05-22 DIAGNOSIS — F32A Depression, unspecified: Secondary | ICD-10-CM | POA: Diagnosis not present

## 2022-05-22 DIAGNOSIS — L03116 Cellulitis of left lower limb: Secondary | ICD-10-CM | POA: Diagnosis not present

## 2022-05-22 DIAGNOSIS — F419 Anxiety disorder, unspecified: Secondary | ICD-10-CM | POA: Diagnosis not present

## 2022-05-22 MED ORDER — AMOXICILLIN-POT CLAVULANATE 875-125 MG PO TABS
1.0000 | ORAL_TABLET | Freq: Two times a day (BID) | ORAL | Status: DC
  Administered 2022-05-22 – 2022-05-25 (×6): 1 via ORAL
  Filled 2022-05-22 (×6): qty 1

## 2022-05-22 NOTE — Progress Notes (Signed)
PROGRESS NOTE    Steve [name]  QVZ:[id number] DOB: 12-Jun-1991 DOA: 05/18/2022 PCP: [facility]    Brief Narrative:  31 year old with history of PTSD, chronic anxiety/depression/psychosis, schizophrenia, polysubstance abuse disorder, chronic left hip decubitus ulcer, C-spine injury and paraplegia, chronic indwelling Foley catheter, previous history of MRSA bacteremia and pelvic osteomyelitis brought to the emergency room with fever, generalized weakness.  Admitted with infected decubitus ulcer.  Patient complains of running out of pain medications. Poor historian overall. Looks like patient had recent extensive hospitalization, long stay in a [facility].   Assessment & Plan:   Sepsis secondary to left pelvic/hip cellulitis, left knee cellulitis present on admission: Presented with tachycardia tachypnea and fever. Blood was negative so far.  Treated with broad-spectrum antibiotics.  Osteomyelitis is chronic.  Soft tissue swelling is improving. Pain management, bowel regimen, muscle relaxants. Local wound care as formulated by wound care team. No indication for surgical debridement. Changed to Augmentin to complete 2 weeks of therapy to help healing soft tissue infection.  Acute blood loss anemia in the setting of multiple pressure ulcers: Hemoglobin 6.6.  Received 2 units of PRBC with appropriate response.  Chronic anxiety/depression and psychosis: Patient on multiple medications including Abilify, Lyrica, Zoloft.  C-spine injury with paraplegia: Symptomatic treatment.  Pain control.  Spasm controlled.  Personal care.  Patient is chronically sick but medically stable.  He will need aggressive wound care at home.   Called patient's mother who stated that patient's wife should be contact person.  No phone number available.  We will find out safe disposition plan. Unable to connect in the home phone number.  Patient does not remember wife's number. If able to connect with  patient's wife, will discharge home with oral antibiotic therapy.   DVT prophylaxis: enoxaparin (LOVENOX) injection 40 mg Start: 05/18/22 1800   Code Status: Full code Family Communication: None Disposition Plan: Status is: Inpatient Remains inpatient appropriate because: Unable to find safe disposition.     Consultants:  Wound care  Procedures:  None  Antimicrobials:  Vancomycin, Flagyl and cefepime 9/1--- 9/5 Augmentin 9/5---   Subjective:  Patient seen and examined.  No overnight events.  Poor historian.  Keeps changing the story.  He tells me that he will go home if I can give [name] some pain medications.  Unable to contact patient's wife. Paraplegic, cannot be discharged home without verifying support system.  Objective: Vitals:   05/21/22 1441 05/21/22 1920 05/21/22 2042 05/22/22 0331  BP:   102/62 (!) 99/57  Pulse: 70  60 (!) 59  Resp: 18  18 18   Temp: 97.7 F (36.5 C)  97.7 F (36.5 C) (!) 97.5 F (36.4 C)  TempSrc: Oral  Oral Oral  SpO2: 99%  100% 99%  Weight:      Height:  5\' 9"  (1.753 m)      Intake/Output Summary (Last 24 hours) at 05/22/2022 1257 Last data filed at 05/22/2022 0940 Gross per 24 hour  Intake 3104.03 ml  Output 1450 ml  Net 1654.03 ml    Filed Weights   05/18/22 1953  Weight: 59 kg    Examination:  General: Frail and debilitated.  Chronically sick looking.  Alert and awake.  Oriented x2-3.  Flat affect. Cardiovascular: Regular rate rhythm.  No murmurs. Respiratory: Bilateral clear Gastrointestinal: Soft.  Nontender.  Bowel sound present. Ext:  Stage IV sacral decubitus ulcer, two of them left ischial tuberosity on the left buttock. Superficial pigmentation and ulceration of the left  knee. Neuro: Paraplegic, unable to move lower extremity independently. 2+ edema on the dorsum of the foot left more than right.      Data Reviewed: I have personally reviewed following labs and imaging studies  CBC: Recent Labs  Lab  05/18/22 1305 05/20/22 0634 05/21/22 0446  WBC 8.6 4.2 3.5*  NEUTROABS  --  2.8 1.7  HGB 8.0* 6.6* 8.1*  HCT 25.6* 22.0* 26.4*  MCV 82.8 84.9 83.8  PLT 370 322 318    Basic Metabolic Panel: Recent Labs  Lab 05/18/22 1305 05/20/22 0634 05/21/22 0446  NA 131* 136 135  K 3.9 3.6 3.9  CL 102 108 105  CO2 23 23 25   GLUCOSE 108* 113* 91  BUN 14 10 8   CREATININE 0.70 0.65 0.65  CALCIUM 7.6* 7.5* 8.2*  MG 2.1 1.8  --   PHOS  --  3.8  --     GFR: Estimated Creatinine Clearance: 110.6 mL/min (by C-G formula based on SCr of 0.65 mg/dL). Liver Function Tests: Recent Labs  Lab 05/18/22 1305 05/20/22 0634  AST 8* 6*  ALT 8 9  ALKPHOS 81 64  BILITOT 0.2* 0.2*  PROT 7.4 6.3*  ALBUMIN 2.3* 1.9*    No results for input(s): "LIPASE", "AMYLASE" in the last 168 hours. No results for input(s): "AMMONIA" in the last 168 hours. Coagulation Profile: Recent Labs  Lab 05/19/22 0545  INR 1.3*    Cardiac Enzymes: Recent Labs  Lab 05/18/22 1305  CKTOTAL 114    BNP (last 3 results) No results for input(s): "PROBNP" in the last 8760 hours. HbA1C: No results for input(s): "HGBA1C" in the last 72 hours. CBG: No results for input(s): "GLUCAP" in the last 168 hours. Lipid Profile: No results for input(s): "CHOL", "HDL", "LDLCALC", "TRIG", "CHOLHDL", "LDLDIRECT" in the last 72 hours. Thyroid Function Tests: No results for input(s): "TSH", "T4TOTAL", "FREET4", "T3FREE", "THYROIDAB" in the last 72 hours. Anemia Panel: No results for input(s): "VITAMINB12", "FOLATE", "FERRITIN", "TIBC", "IRON", "RETICCTPCT" in the last 72 hours. Sepsis Labs: Recent Labs  Lab 05/18/22 1305 05/19/22 0545  PROCALCITON  --  <0.10  LATICACIDVEN 0.9  --      Recent Results (from the past 240 hour(s))  Blood culture (routine x 2)     Status: None (Preliminary result)   Collection Time: 05/18/22  1:00 PM   Specimen: BLOOD RIGHT HAND  Result Value Ref Range Status   Specimen Description   Final     BLOOD RIGHT HAND Performed at [facility] [address] [place], [place]    Special Requests   Final    BOTTLES DRAWN AEROBIC AND ANAEROBIC Blood Culture adequate volume Performed at [facility], [address] [place], [place]    Culture   Final    NO GROWTH 4 DAYS Performed at [facility], [address] [place], [address] [place]    Report Status PENDING  Incomplete  Blood culture (routine x 2)     Status: None (Preliminary result)   Collection Time: 05/18/22  1:05 PM   Specimen: BLOOD  Result Value Ref Range Status   Specimen Description   Final    BLOOD BLOOD LEFT FOREARM Performed at [facility], [address] [place], [place]    Special Requests   Final    BOTTLES DRAWN AEROBIC AND ANAEROBIC Blood Culture adequate volume Performed at [facility], [address], [place], [place]  Culture   Final    NO GROWTH 4 DAYS Performed at [facility], [address], [place], [place] [address]    Report Status PENDING  Incomplete  Resp Panel by RT-PCR (Flu A&B, Covid)     Status: None   Collection Time: 05/18/22  1:05 PM   Specimen: Nasal Swab  Result Value Ref Range Status   SARS Coronavirus 2 by RT PCR NEGATIVE NEGATIVE Final    Comment: (NOTE) SARS-CoV-2 target nucleic acids are NOT DETECTED.  The SARS-CoV-2 RNA is generally detectable in upper respiratory specimens during the acute phase of infection. The lowest concentration of SARS-CoV-2 viral copies this assay can detect is 138 copies/mL. A negative result does not preclude SARS-Cov-2 infection and should not be used as the sole basis for treatment or other patient management decisions. A negative result may occur with  improper specimen collection/handling, submission of specimen other than nasopharyngeal swab, presence of viral mutation(s) within the areas targeted by this  assay, and inadequate number of viral copies(<138 copies/mL). A negative result must be combined with clinical observations, patient history, and epidemiological information. The expected result is Negative.  Fact Sheet for Patients:  BloggerCourse.com  Fact Sheet for Healthcare Providers:  SeriousBroker.it  This test is no t yet approved or cleared by the [place] FDA and  has been authorized for detection and/or diagnosis of SARS-CoV-2 by FDA under an Emergency Use Authorization (EUA). This EUA will remain  in effect (meaning this test can be used) for the duration of the COVID-19 declaration under Section 564(b)(1) of the Act, 21 U.S.C.section 360bbb-3(b)(1), unless the authorization is terminated  or revoked sooner.       Influenza A by PCR NEGATIVE NEGATIVE Final   Influenza B by PCR NEGATIVE NEGATIVE Final    Comment: (NOTE) The Xpert Xpress SARS-CoV-2/FLU/RSV plus assay is intended as an aid in the diagnosis of influenza from Nasopharyngeal swab specimens and should not be used as a sole basis for treatment. Nasal washings and aspirates are unacceptable for Xpert Xpress SARS-CoV-2/FLU/RSV testing.  Fact Sheet for Patients: BloggerCourse.com  Fact Sheet for Healthcare Providers: SeriousBroker.it  This test is not yet approved or cleared by the [place] FDA and has been authorized for detection and/or diagnosis of SARS-CoV-2 by FDA under an Emergency Use Authorization (EUA). This EUA will remain in effect (meaning this test can be used) for the duration of the COVID-19 declaration under Section 564(b)(1) of the Act, 21 U.S.C. section 360bbb-3(b)(1), unless the authorization is terminated or revoked.  Performed at [facility], [address] [place], [place] [address]          Radiology Studies: No results  found.      Scheduled Meds:  amoxicillin-clavulanate  1 tablet Oral Q12H   ARIPiprazole  15 mg Oral Daily   enoxaparin (LOVENOX) injection  40 mg Subcutaneous Q24H   midodrine  5 mg Oral BID WC   pantoprazole  40 mg Oral Daily   pregabalin  150 mg Oral BID   senna  1 tablet Oral Daily   sertraline  25 mg Oral Daily   Continuous Infusions:     LOS: 4 days    Time spent: 35 minutes    Dorcas Carrow, MD [facility] Pager [phone]

## 2022-05-23 DIAGNOSIS — A419 Sepsis, unspecified organism: Secondary | ICD-10-CM | POA: Diagnosis not present

## 2022-05-23 LAB — CULTURE, BLOOD (ROUTINE X 2)
Culture: NO GROWTH
Culture: NO GROWTH
Special Requests: ADEQUATE
Special Requests: ADEQUATE

## 2022-05-23 LAB — MRSA NEXT GEN BY PCR, NASAL: MRSA by PCR Next Gen: DETECTED — AB

## 2022-05-23 LAB — CORTISOL: Cortisol, Plasma: 13.9 ug/dL

## 2022-05-23 MED ORDER — MUPIROCIN 2 % EX OINT
1.0000 | TOPICAL_OINTMENT | Freq: Two times a day (BID) | CUTANEOUS | Status: DC
  Administered 2022-05-23 – 2022-05-25 (×5): 1 via NASAL
  Filled 2022-05-23: qty 22

## 2022-05-23 MED ORDER — CHLORHEXIDINE GLUCONATE CLOTH 2 % EX PADS
6.0000 | MEDICATED_PAD | Freq: Every day | CUTANEOUS | Status: DC
  Administered 2022-05-23 – 2022-05-25 (×3): 6 via TOPICAL

## 2022-05-23 MED ORDER — ACETAMINOPHEN 325 MG PO TABS: 650.0000 mg | ORAL_TABLET | Freq: Four times a day (QID) | ORAL | Status: DC | PRN

## 2022-05-23 NOTE — Progress Notes (Signed)
PROGRESS NOTE Steve [name]  [id number] DOB: [birth year] DOA: 05/18/2022 PCP: [facility]  Brief Narrative:  31 year old with history of PTSD, chronic anxiety/depression/psychosis, schizophrenia, polysubstance abuse disorder, chronic left hip decubitus ulcer, C-spine injury and paraplegia, chronic indwelling Foley catheter, previous history of MRSA bacteremia and pelvic osteomyelitis brought to the emergency room with fever, generalized weakness. He was admitted with infected decubitus ulcer,  patient complains of running out of pain medications. Poor historian overall.Looks like patient had recent extensive hospitalization, long stay in a [employer]:  Sepsis secondary to left pelvic/hip cellulitis POA Left knee cellulitis POA Chronic osteomyelitis and soft tissue swelling: Treated with broad-spectrum antibiotics.  Sepsis physiology has resolved.  Continue pain management bowel regimen MiraLAX and local wound care as planned by wound care team.  No indication for surgical debridement.  Continue Augmentin to complete 2 weeks of therapy to aid in healing of soft tissue infection  Acute Blood loss anemia in the setting of multiple pressure ulcers: H&H 6.6 g improved with 2 unit PRBC appropriately.  Chronic anxiety and depression Psychosis Poor historian: Alert awake, does not have good memory and recollection.  On multiple medication including Abilify, Lyrica, Zoloft.  C-spine injury with paraplegia: Supportive care symptomatic treatment antispasmodic and pain control  At this time patient medically stable although he is chronically sick looking, bedbound will need aggressive wound care at home and close follow-up. MD called patient's mother who stated that patient's wife should be contact person unable to contact his wife. Awaiting on safe disposition plan. Unable to connect in the home phone number.  Patient does not remember wife's number. If able to  connect with patient's wife, will discharge home with oral antibiotic therapy.  DVT prophylaxis: enoxaparin (LOVENOX) injection 40 mg Start: 05/18/22 1800 Code Status: Full code Family Communication: None Disposition Plan: Status is: Inpatient Remains inpatient appropriate because: Unable to find safe disposition.   Consultants:  Wound care  Procedures:  None  Antimicrobials:  Vancomycin, Flagyl and cefepime 9/1--- 9/5 Augmentin 9/5---   Subjective: Seen and examined alert awake, poor historian, no new complaints - keeps changing the story.  Objective: Vitals:   05/22/22 2102 05/23/22 0337 05/23/22 1311 05/23/22 1319  BP: (!) 91/54 (!) 97/58 107/63   Pulse: 99 60 70   Resp: 18 18 17 17   Temp: 98.3 F (36.8 C) 97.6 F (36.4 C) (!) 97.5 F (36.4 C)   TempSrc: Oral Oral Oral   SpO2: 97% (!) 79% 98%   Weight:      Height:        Intake/Output Summary (Last 24 hours) at 05/23/2022 1349 Last data filed at 05/23/2022 0900 Gross per 24 hour  Intake 480 ml  Output 2500 ml  Net -2020 ml    Filed Weights   05/18/22 1953  Weight: 59 kg    Examination: General exam: Aaox2-3, falt affect,appears older than stated age, weak appearing. HEENT:Oral mucosa moist, Ear/Nose WNL grossly, dentition normal. Respiratory system: bilaterally diminished, no use of accessory muscle Cardiovascular system: S1 & S2 +, No JVD,. Gastrointestinal system: Abdomen soft,NT,ND,BS+ Nervous System:Alert, awake, paraplegic unable to move lower extremities Extremities: 2+ edema on the dorsum of the foot left more than right Stage IV sacral decubitus ulcer, left ischial tuberosity and the left buttock. Superficial pigmentation and ulceration of the left knee  Data Reviewed: I have personally reviewed following labs and imaging studies  CBC: Recent Labs  Lab 05/18/22 1305 05/20/22 0634 05/21/22 0446  WBC 8.6 4.2 3.5*  NEUTROABS  --  2.8 1.7  HGB 8.0* 6.6* 8.1*  HCT 25.6* 22.0* 26.4*  MCV  82.8 84.9 83.8  PLT 370 322 318    Basic Metabolic Panel: Recent Labs  Lab 05/18/22 1305 05/20/22 0634 05/21/22 0446  NA 131* 136 135  K 3.9 3.6 3.9  CL 102 108 105  CO2 23 23 25   GLUCOSE 108* 113* 91  BUN 14 10 8   CREATININE 0.70 0.65 0.65  CALCIUM 7.6* 7.5* 8.2*  MG 2.1 1.8  --   PHOS  --  3.8  --     GFR: Estimated Creatinine Clearance: 110.6 mL/min (by C-G formula based on SCr of 0.65 mg/dL). Liver Function Tests: Recent Labs  Lab 05/18/22 1305 05/20/22 0634  AST 8* 6*  ALT 8 9  ALKPHOS 81 64  BILITOT 0.2* 0.2*  PROT 7.4 6.3*  ALBUMIN 2.3* 1.9*    No results for input(s): "LIPASE", "AMYLASE" in the last 168 hours. No results for input(s): "AMMONIA" in the last 168 hours. Coagulation Profile: Recent Labs  Lab 05/19/22 0545  INR 1.3*    Cardiac Enzymes: Recent Labs  Lab 05/18/22 1305  CKTOTAL 114    Recent Results (from the past 240 hour(s))  Blood culture (routine x 2)     Status: None   Collection Time: 05/18/22  1:00 PM   Specimen: BLOOD RIGHT HAND  Result Value Ref Range Status   Specimen Description   Final    BLOOD RIGHT HAND Performed at [facility], [address], [place], [place]    Special Requests   Final    BOTTLES DRAWN AEROBIC AND ANAEROBIC Blood Culture adequate volume Performed at [facility], [address], [place], [place]    Culture   Final    NO GROWTH 5 DAYS Performed at [facility], [address] [place], [address] [place]    Report Status 05/23/2022 FINAL  Final  Blood culture (routine x 2)     Status: None   Collection Time: 05/18/22  1:05 PM   Specimen: BLOOD  Result Value Ref Range Status   Specimen Description   Final    BLOOD BLOOD LEFT FOREARM Performed at [facility], [address], [place], [place]    Special Requests   Final    BOTTLES DRAWN AEROBIC AND ANAEROBIC Blood Culture adequate volume Performed  at [facility], [address], [place], [place]    Culture   Final    NO GROWTH 5 DAYS Performed at [facility], [address] [place], [address] [place]    Report Status 05/23/2022 FINAL  Final  Resp Panel by RT-PCR (Flu A&B, Covid)     Status: None   Collection Time: 05/18/22  1:05 PM   Specimen: Nasal Swab  Result Value Ref Range Status   SARS Coronavirus 2 by RT PCR NEGATIVE NEGATIVE Final    Comment: (NOTE) SARS-CoV-2 target nucleic acids are NOT DETECTED.  The SARS-CoV-2 RNA is generally detectable in upper respiratory specimens during the acute phase of infection. The lowest concentration of SARS-CoV-2 viral copies this assay can detect is 138 copies/mL. A negative result does not preclude SARS-Cov-2 infection and should not be used as the sole basis for treatment or other patient management decisions. A negative result may occur with  improper specimen collection/handling, submission of specimen other than nasopharyngeal swab, presence of  viral mutation(s) within the areas targeted by this assay, and inadequate number of viral copies(<138 copies/mL). A negative result must be combined with clinical observations, patient history, and epidemiological information. The expected result is Negative.  Fact Sheet for Patients:  BloggerCourse.com  Fact Sheet for Healthcare Providers:  SeriousBroker.it  This test is no t yet approved or cleared by the [place] FDA and  has been authorized for detection and/or diagnosis of SARS-CoV-2 by FDA under an Emergency Use Authorization (EUA). This EUA will remain  in effect (meaning this test can be used) for the duration of the COVID-19 declaration under Section 564(b)(1) of the Act, 21 U.S.C.section 360bbb-3(b)(1), unless the authorization is terminated  or revoked sooner.       Influenza A by PCR NEGATIVE NEGATIVE Final   Influenza B by  PCR NEGATIVE NEGATIVE Final    Comment: (NOTE) The Xpert Xpress SARS-CoV-2/FLU/RSV plus assay is intended as an aid in the diagnosis of influenza from Nasopharyngeal swab specimens and should not be used as a sole basis for treatment. Nasal washings and aspirates are unacceptable for Xpert Xpress SARS-CoV-2/FLU/RSV testing.  Fact Sheet for Patients: BloggerCourse.com  Fact Sheet for Healthcare Providers: SeriousBroker.it  This test is not yet approved or cleared by the [place] FDA and has been authorized for detection and/or diagnosis of SARS-CoV-2 by FDA under an Emergency Use Authorization (EUA). This EUA will remain in effect (meaning this test can be used) for the duration of the COVID-19 declaration under Section 564(b)(1) of the Act, 21 U.S.C. section 360bbb-3(b)(1), unless the authorization is terminated or revoked.  Performed at [facility], [address] [place], [place] [address]   MRSA Next Gen by PCR, Nasal     Status: Abnormal   Collection Time: 05/22/22  7:30 PM   Specimen: Nasal Mucosa; Nasal Swab  Result Value Ref Range Status   MRSA by PCR Next Gen DETECTED (A) NOT DETECTED Final    Comment: ARTIS,D AT 0126 ON 05/23/22 BY VAZQUEZJ (NOTE) The GeneXpert MRSA Assay (FDA approved for NASAL specimens only), is one component of a comprehensive MRSA colonization surveillance program. It is not intended to diagnose MRSA infection nor to guide or monitor treatment for MRSA infections. Test performance is not FDA approved in patients less than 25 years old. Performed at [facility], [address] [place], [place] [address]   Radiology Studies: No results found. Scheduled Meds:  amoxicillin-clavulanate  1 tablet Oral Q12H   ARIPiprazole  15 mg Oral Daily   Chlorhexidine Gluconate Cloth  6 each Topical Daily   enoxaparin (LOVENOX) injection  40 mg Subcutaneous Q24H    midodrine  5 mg Oral BID WC   mupirocin ointment  1 Application Nasal BID   pantoprazole  40 mg Oral Daily   pregabalin  150 mg Oral BID   senna  1 tablet Oral Daily   sertraline  25 mg Oral Daily  Continuous Infusions:  LOS: 5 days  Time spent: 35 minutes  Lanae Boast, MD [facility] Pager [phone]

## 2022-05-24 DIAGNOSIS — A419 Sepsis, unspecified organism: Secondary | ICD-10-CM | POA: Diagnosis not present

## 2022-05-24 MED ORDER — OXYCODONE HCL 5 MG PO TABS
5.0000 mg | ORAL_TABLET | ORAL | Status: AC | PRN
  Administered 2022-05-24 (×2): 5 mg via ORAL
  Filled 2022-05-24 (×2): qty 1

## 2022-05-24 MED ORDER — TRAMADOL HCL 50 MG PO TABS
50.0000 mg | ORAL_TABLET | Freq: Four times a day (QID) | ORAL | Status: DC | PRN
  Administered 2022-05-24 – 2022-05-25 (×2): 50 mg via ORAL
  Filled 2022-05-24 (×2): qty 1

## 2022-05-24 NOTE — Progress Notes (Signed)
PROGRESS NOTE COBE VINEY  ZOX:[id number] DOB: [birth year] DOA: 05/18/2022 PCP: [facility]  Brief Narrative:  31 year old with history of PTSD, chronic anxiety/depression/psychosis, schizophrenia, polysubstance abuse disorder, chronic left hip decubitus ulcer, C-spine injury and paraplegia, chronic indwelling Foley catheter, previous history of MRSA bacteremia and pelvic osteomyelitis brought to the emergency room with fever, generalized weakness. He was admitted with infected decubitus ulcer,  patient complains of running out of pain medications. Poor historian overall.Looks like patient had recent extensive hospitalization, long stay in a [facility].  Assessment & Plan:  Sepsis secondary to left pelvic/hip cellulitis POA Left knee cellulitis POA Chronic osteomyelitis and soft tissue swelling: Treated with broad-spectrum antibiotics.  Sepsis physiology has resolved.  Continue pain management bowel regimen MiraLAX and local wound care as planned by wound care team.  No indication for surgical debridement at this time plan is to complete Augmentin to complete 2 weeks of therapy to aid in healing of soft tissue infection.  Continue wound care.  Acute Blood loss anemia in the setting of multiple pressure ulcers: H&H 6.6 g improved with 2 unit PRBC appropriately. Recent Labs  Lab 05/18/22 1305 05/20/22 0634 05/21/22 0446  HGB 8.0* 6.6* 8.1*  HCT 25.6* 22.0* 26.4*    Chronic anxiety and depression Psychosis Poor historian: Alert awake poor historian,does not have good memory and recollection.  On multiple medication including Abilify, Lyrica, Zoloft.  Mood stable.  C-spine injury with paraplegia: Supportive care symptomatic treatment antispasmodic and pain control  At this time patient medically stable although he is chronically sick looking, bedbound will need aggressive wound care at home and close follow-up. Previously patient's mother stated that patient's wife should be  contact person unable to contact his wife. Awaiting on safe disposition plan.  Nursing reports wife unable to take the patient home.  If able to connect with patient's wife, will discharge home with oral antibiotic therapy.  DVT prophylaxis: enoxaparin (LOVENOX) injection 40 mg Start: 05/18/22 1800 Code Status: Full code Family Communication: None Disposition Plan: Status is: Inpatient Remains inpatient appropriate because: Unable to find safe disposition.   Consultants:  Wound care  Procedures:  None  Antimicrobials:  Vancomycin, Flagyl and cefepime 9/1--- 9/5 Augmentin 9/5---   Subjective: Seen and examined this morning, appears poor historian no new complaints.   Nursing reports wife unable to take the patient home  Objective: Vitals:   05/23/22 1319 05/23/22 1404 05/24/22 0534 05/24/22 0534  BP:   123/75 123/75  Pulse:   (!) 53 (!) 54  Resp: 17 17 18 18   Temp:   98 F (36.7 C) 98 F (36.7 C)  TempSrc:   Oral Oral  SpO2:   97% 97%  Weight:      Height:        Intake/Output Summary (Last 24 hours) at 05/24/2022 1259 Last data filed at 05/23/2022 1900 Gross per 24 hour  Intake 240 ml  Output --  Net 240 ml    Filed Weights   05/18/22 1953  Weight: 59 kg    Examination: General exam: Aao2-3, flat affect,older than stated age, weak appearing. HEENT:Oral mucosa moist, Ear/Nose WNL grossly, dentition normal. Respiratory system: bilaterally diminished, no use of accessory muscle Cardiovascular system: S1 & S2 +, No JVD,. Gastrointestinal system: Abdomen soft,NT,ND,BS+ Nervous System:Alert, awake, moving extremities and grossly nonfocal Extremities: 2+ edema on the dorsum of the foot left more than right, stage IV sacral decubitus ulcer left distal tuberosity and left buttock  Skin: No rashes,no icterus.  MSK: Normal muscle bulk,tone, power   Data Reviewed: I have personally reviewed following labs and imaging studies  CBC: Recent Labs  Lab 05/18/22 1305  05/20/22 0634 05/21/22 0446  WBC 8.6 4.2 3.5*  NEUTROABS  --  2.8 1.7  HGB 8.0* 6.6* 8.1*  HCT 25.6* 22.0* 26.4*  MCV 82.8 84.9 83.8  PLT 370 322 0000000    Basic Metabolic Panel: Recent Labs  Lab 05/18/22 1305 05/20/22 0634 05/21/22 0446  NA 131* 136 135  K 3.9 3.6 3.9  CL 102 108 105  CO2 23 23 25   GLUCOSE 108* 113* 91  BUN 14 10 8   CREATININE 0.70 0.65 0.65  CALCIUM 7.6* 7.5* 8.2*  MG 2.1 1.8  --   PHOS  --  3.8  --     GFR: Estimated Creatinine Clearance: 110.6 mL/min (by C-G formula based on SCr of 0.65 mg/dL). Liver Function Tests: Recent Labs  Lab 05/18/22 1305 05/20/22 0634  AST 8* 6*  ALT 8 9  ALKPHOS 81 64  BILITOT 0.2* 0.2*  PROT 7.4 6.3*  ALBUMIN 2.3* 1.9*    No results for input(s): "LIPASE", "AMYLASE" in the last 168 hours. No results for input(s): "AMMONIA" in the last 168 hours. Coagulation Profile: Recent Labs  Lab 05/19/22 0545  INR 1.3*    Cardiac Enzymes: Recent Labs  Lab 05/18/22 1305  CKTOTAL 114    Recent Results (from the past 240 hour(s))  Blood culture (routine x 2)     Status: None   Collection Time: 05/18/22  1:00 PM   Specimen: BLOOD RIGHT HAND  Result Value Ref Range Status   Specimen Description   Final    BLOOD RIGHT HAND Performed at [facility] [address], [place], [place] [address]    Special Requests   Final    BOTTLES DRAWN AEROBIC AND ANAEROBIC Blood Culture adequate volume Performed at [facility] [address], [place], [place] [address]    Culture   Final    NO GROWTH 5 DAYS Performed at [facility], [place] [address], [place], [place] [address]    Report Status 05/23/2022 FINAL  Final  Blood culture (routine x 2)     Status: None   Collection Time: 05/18/22  1:05 PM   Specimen: BLOOD  Result Value Ref Range Status   Specimen Description   Final    BLOOD BLOOD LEFT FOREARM Performed at [facility] [address]  [place], [place] [address]    Special Requests   Final    BOTTLES DRAWN AEROBIC AND ANAEROBIC Blood Culture adequate volume Performed at [facility] [address] [place], [place] [address]    Culture   Final    NO GROWTH 5 DAYS Performed at [facility], [place] [address] [place],  [address]    Report Status 05/23/2022 FINAL  Final  Resp Panel by RT-PCR (Flu A&B, Covid)     Status: None   Collection Time: 05/18/22  1:05 PM   Specimen: Nasal Swab  Result Value Ref Range Status   SARS Coronavirus 2 by RT PCR NEGATIVE NEGATIVE Final    Comment: (NOTE) SARS-CoV-2 target nucleic acids are NOT DETECTED.  The SARS-CoV-2 RNA is generally detectable in upper respiratory specimens during the acute phase of infection. The lowest concentration of SARS-CoV-2 viral copies this assay can detect is 138 copies/mL. A negative result does not preclude SARS-Cov-2 infection and should not be  used as the sole basis for treatment or other patient management decisions. A negative result may occur with  improper specimen collection/handling, submission of specimen other than nasopharyngeal swab, presence of viral mutation(s) within the areas targeted by this assay, and inadequate number of viral copies(<138 copies/mL). A negative result must be combined with clinical observations, patient history, and epidemiological information. The expected result is Negative.  Fact Sheet for Patients:  BloggerCourse.com  Fact Sheet for Healthcare Providers:  SeriousBroker.it  This test is no t yet approved or cleared by the [place] FDA and  has been authorized for detection and/or diagnosis of SARS-CoV-2 by FDA under an Emergency Use Authorization (EUA). This EUA will remain  in effect (meaning this test can be used) for the duration of the COVID-19 declaration under Section 564(b)(1) of the Act, 21 U.S.C.section 360bbb-3(b)(1),  unless the authorization is terminated  or revoked sooner.       Influenza A by PCR NEGATIVE NEGATIVE Final   Influenza B by PCR NEGATIVE NEGATIVE Final    Comment: (NOTE) The Xpert Xpress SARS-CoV-2/FLU/RSV plus assay is intended as an aid in the diagnosis of influenza from Nasopharyngeal swab specimens and should not be used as a sole basis for treatment. Nasal washings and aspirates are unacceptable for Xpert Xpress SARS-CoV-2/FLU/RSV testing.  Fact Sheet for Patients: BloggerCourse.com  Fact Sheet for Healthcare Providers: SeriousBroker.it  This test is not yet approved or cleared by the [place] FDA and has been authorized for detection and/or diagnosis of SARS-CoV-2 by FDA under an Emergency Use Authorization (EUA). This EUA will remain in effect (meaning this test can be used) for the duration of the COVID-19 declaration under Section 564(b)(1) of the Act, 21 U.S.C. section 360bbb-3(b)(1), unless the authorization is terminated or revoked.  Performed at [facility], [address] [place], [place] [address]   MRSA Next Gen by PCR, Nasal     Status: Abnormal   Collection Time: 05/22/22  7:30 PM   Specimen: Nasal Mucosa; Nasal Swab  Result Value Ref Range Status   MRSA by PCR Next Gen DETECTED (A) NOT DETECTED Final    Comment: ARTIS,D AT 0126 ON 05/23/22 BY VAZQUEZJ (NOTE) The GeneXpert MRSA Assay (FDA approved for NASAL specimens only), is one component of a comprehensive MRSA colonization surveillance program. It is not intended to diagnose MRSA infection nor to guide or monitor treatment for MRSA infections. Test performance is not FDA approved in patients less than 77 years old. Performed at [facility], [address], [place], [place] [address]   Radiology Studies: No results found. Scheduled Meds:  amoxicillin-clavulanate  1 tablet Oral Q12H   ARIPiprazole  15 mg  Oral Daily   Chlorhexidine Gluconate Cloth  6 each Topical Daily   enoxaparin (LOVENOX) injection  40 mg Subcutaneous Q24H   midodrine  5 mg Oral BID WC   mupirocin ointment  1 Application Nasal BID   pantoprazole  40 mg Oral Daily   pregabalin  150 mg Oral BID   senna  1 tablet Oral Daily   sertraline  25 mg Oral Daily  Continuous Infusions:  LOS: 6 days  Time spent: 35 minutes  Lanae Boast, MD [facility] Pager [phone]

## 2022-05-25 DIAGNOSIS — L03116 Cellulitis of left lower limb: Secondary | ICD-10-CM | POA: Diagnosis not present

## 2022-05-25 LAB — CREATININE, SERUM
Creatinine, Ser: 0.61 mg/dL (ref 0.61–1.24)
GFR, Estimated: >60 mL/min (ref >60)

## 2022-05-25 MED ORDER — SENNA 8.6 MG PO TABS: 1.0000 | ORAL_TABLET | Freq: Every day | ORAL | 0 refills | Status: AC

## 2022-05-25 MED ORDER — MUPIROCIN 2 % EX OINT: 1.0000 | TOPICAL_OINTMENT | Freq: Two times a day (BID) | CUTANEOUS | 0 refills | Status: DC

## 2022-05-25 MED ORDER — AMOXICILLIN-POT CLAVULANATE 875-125 MG PO TABS: 1.0000 | ORAL_TABLET | Freq: Two times a day (BID) | ORAL | 0 refills | Status: AC

## 2022-05-25 NOTE — Progress Notes (Addendum)
Discussed disposition with patient. Talked to wife on phone who agreed to discuss plan with MD. Phone number [phone]. Per wife may have to call multiple times.

## 2022-05-25 NOTE — Plan of Care (Signed)

## 2022-05-25 NOTE — TOC Progression Note (Signed)
Transition of Care [facility]) - Progression Note    Patient Details  Name: Steve [name] MRN: [id number] Date of Birth: Mar 29, 1991  [facility] CM/SW Contact  Golda Acre, RN Phone Number: 05/25/2022, 12:40 PM  Clinical Narrative:    Micheal Likens -messageleft for case worker to call back.        Expected Discharge Plan and Services           Expected Discharge Date: 05/25/22                                     Social Determinants of Health (SDOH) Interventions    Readmission Risk Interventions   No data to display

## 2022-05-25 NOTE — Progress Notes (Signed)
Patient and wife instructed on AVS including follow up appointment, medications, and would care.  Patient was provided with a few dressing changes before [facility] can come out.  No s/s of distress noted.  PIV was removed and patient was assisted in dressing by wife and utilized slide board to own wheelchair.  All questions answered patient being wheeled out by wife to personal car.

## 2022-05-25 NOTE — Discharge Summary (Signed)
Physician Discharge Summary  Steve [name] [id number] DOB: 12-23-1990 DOA: 05/18/2022  PCP: [facility]  Admit date: 05/18/2022 Discharge date: 05/25/2022 Recommendations for Outpatient Follow-up:  Follow up with PCP in 1 weeks-call for appointment Please obtain BMP/CBC in one week Cont wound care with RN at home  Discharge Dispo: hh Discharge Condition: Stable Code Status:   Code Status: Full Code Diet recommendation:  Diet Order             Diet - low sodium heart healthy           Diet Heart Room service appropriate? No; Fluid consistency: Thin  Diet effective now                    Brief/Interim Summary:  31 year old with history of PTSD, chronic anxiety/depression/psychosis, schizophrenia, polysubstance abuse disorder, chronic left hip decubitus ulcer, C-spine injury and paraplegia, chronic indwelling Foley catheter, previous history of MRSA bacteremia and pelvic osteomyelitis brought to the emergency room with fever, generalized weakness. He was admitted with infected decubitus ulcer,  patient complains of running out of pain medications. Poor historian overall.Looks like patient had recent extensive hospitalization, long stay in a [place] to contact wife she is on her way to pick him up.  She confirmed that he already has home care nurse coming for dressing change dressing instruction provided patient wife is also doing dressing change at home.  Educated on aggressive dressing change at home.  Discharge Diagnoses:  Principal Problem:   Sepsis ([place]) Active Problems:   Anxiety and depression   Chronic osteomyelitis, pelvis, left (HCC)   Chronic osteomyelitis, pelvis (HCC)   Cellulitis of left hip   Cellulitis of left knee   Chronic multifocal osteomyelitis, left femur (HCC)  Sepsis secondary to left pelvic/hip cellulitis POA Left knee cellulitis POA Chronic osteomyelitis and soft tissue swelling: Treated with broad-spectrum antibiotics.   Sepsis physiology has resolved.  Continue pain management bowel regimen MiraLAX and local wound care as planned by wound care team.  No indication for surgical debridement at this time plan is to complete Augmentin to complete 2 weeks of therapy to aid in healing of soft tissue infection.  Continue wound care at home.   Acute Blood loss anemia in the setting of multiple pressure ulcers: H&H 6.6 g improved with 2 unit PRBC appropriately. Last Labs        Recent Labs  Lab 05/18/22 1305 05/20/22 0634 05/21/22 0446  HGB 8.0* 6.6* 8.1*  HCT 25.6* 22.0* 26.4*      Chronic anxiety and depression Psychosis Poor historian: Alert awake poor historian,does not have good memory and recollection.  On multiple medication including Abilify, Lyrica, Zoloft.  Mood stable.   C-spine injury with paraplegia: Supportive care symptomatic treatment antispasmodic and pain control    Pressure Ulcer: Pressure Injury 05/30/21 Heel Left Deep Tissue Pressure Injury - Purple or maroon localized area of discolored intact skin or blood-filled blister due to damage of underlying soft tissue from pressure and/or shear. (Active)  05/30/21 1935  Location: Heel  Location Orientation: Left  Staging: Deep Tissue Pressure Injury - Purple or maroon localized area of discolored intact skin or blood-filled blister due to damage of underlying soft tissue from pressure and/or shear.  Wound Description (Comments):   Present on Admission: Yes     Pressure Injury 05/30/21 Back Right;Mid Stage 2 -  Partial thickness loss of dermis presenting as a shallow open injury with a red, pink wound bed  without slough. (Active)  05/30/21 1936  Location: Back  Location Orientation: Right;Mid  Staging: Stage 2 -  Partial thickness loss of dermis presenting as a shallow open injury with a red, pink wound bed without slough.  Wound Description (Comments):   Present on Admission: Yes     Pressure Injury 05/30/21 Back Lateral;Right Stage 2 -   Partial thickness loss of dermis presenting as a shallow open injury with a red, pink wound bed without slough. (Active)  05/30/21 1937  Location: Back  Location Orientation: Lateral;Right  Staging: Stage 2 -  Partial thickness loss of dermis presenting as a shallow open injury with a red, pink wound bed without slough.  Wound Description (Comments):   Present on Admission: Yes     Pressure Injury 05/30/21 Ankle Left;Lateral Stage 3 -  Full thickness tissue loss. Subcutaneous fat may be visible but bone, tendon or muscle are NOT exposed. (Active)  05/30/21 1938  Location: Ankle  Location Orientation: Left;Lateral  Staging: Stage 3 -  Full thickness tissue loss. Subcutaneous fat may be visible but bone, tendon or muscle are NOT exposed.  Wound Description (Comments):   Present on Admission: Yes     Pressure Injury 05/30/21 Hip Left Stage 4 - Full thickness tissue loss with exposed bone, tendon or muscle. (Active)  05/30/21 1944  Location: Hip  Location Orientation: Left  Staging: Stage 4 - Full thickness tissue loss with exposed bone, tendon or muscle.  Wound Description (Comments):   Present on Admission: Yes     Pressure Injury 08/29/21 Ischial tuberosity Left Stage 4 - Full thickness tissue loss with exposed bone, tendon or muscle. [id number] (Active)  08/29/21 0100  Location: Ischial tuberosity  Location Orientation: Left  Staging: Stage 4 - Full thickness tissue loss with exposed bone, tendon or muscle.  Wound Description (Comments): [id number]  Present on Admission: Yes     Pressure Injury 09/01/21 Sacrum Mid Deep Tissue Pressure Injury - Purple or maroon localized area of discolored intact skin or blood-filled blister due to damage of underlying soft tissue from pressure and/or shear. small dark area in the middle of the s (Active)  09/01/21 0900  Location: Sacrum  Location Orientation: Mid  Staging: Deep Tissue Pressure Injury - Purple or maroon localized area of discolored intact  skin or blood-filled blister due to damage of underlying soft tissue from pressure and/or shear.  Wound Description (Comments): small dark area in the middle of the sacrum  Present on Admission:      Pressure Injury 01/03/22 Hip Left;Posterior;Lateral Stage 4 - Full thickness tissue loss with exposed bone, tendon or muscle. PT HYDROtherapy ONLY (greater trochanter) (Active)  01/03/22 1406  Location: Hip  Location Orientation: Left;Posterior;Lateral  Staging: Stage 4 - Full thickness tissue loss with exposed bone, tendon or muscle.  Wound Description (Comments): PT HYDROtherapy ONLY (greater trochanter)  Present on Admission: Yes  Dressing Type Gauze (Comment);ABD 05/24/22 2122    Consults: Wound care Subjective: Alert awake feels ready for home "My wife is coming to pick me up"   Discharge Exam: Vitals:   05/25/22 0453 05/25/22 0850  BP: 99/68 119/74  Pulse: 79 69  Resp: 14 16  Temp: (!) 97.5 F (36.4 C)   SpO2: 97% 96%   General: Pt is alert, awake, not in acute distress Cardiovascular: RRR, S1/S2 +, no rubs, no gallops Respiratory: CTA bilaterally, no wheezing, no rhonchi Abdominal: Soft, NT, ND, bowel sounds + Extremities: no edema, no cyanosis  Discharge Instructions  Discharge Instructions  Diet - low sodium heart healthy   Complete by: As directed    Discharge instructions   Complete by: As directed    Please call call MD or return to ER for similar or worsening recurring problem that brought you to hospital or if any fever,nausea/vomiting,abdominal pain, uncontrolled pain, chest pain,  shortness of breath or any other alarming symptoms.  Please follow-up your doctor as instructed in a week time and call the office for appointment.  Please avoid alcohol, smoking, or any other illicit substance and maintain healthy habits including taking your regular medications as prescribed.  You were cared for by a hospitalist during your hospital stay. If you have any  questions about your discharge medications or the care you received while you were in the hospital after you are discharged, you can call the unit and ask to speak with the hospitalist on call if the hospitalist that took care of you is not available.  Once you are discharged, your primary care physician will handle any further medical issues. Please note that NO REFILLS for any discharge medications will be authorized once you are discharged, as it is imperative that you return to your primary care physician (or establish a relationship with a primary care physician if you do not have one) for your aftercare needs so that they can reassess your need for medications and monitor your lab values   Discharge wound care:   Complete by: As directed    Wound care to left knee full thickness wound:  Cleanse with NS, pat dry. Apply folded layer of xeroform gauze top with dry gauze and secure with silicone foam. Change xeroform daily, may reused silicone foam for up to 3 days. Change PRN rolling of dressing edges.  Wound care  BID Comments: Wound care to sacral and buttock Stage 4 pressure injures, chronic, nonhealing:  Cleanse with NS, pat surrounding skin dry. Fill defects with fluffed, saline moistened gauze, Top with dry gauze, ABD pads and secure with 2-inch paper tape. Change twice daily and PRN soiling or inadvertent dressing removal.   Increase activity slowly   Complete by: As directed       Allergies as of 05/25/2022       Reactions   Caffeine Anaphylaxis   Chocolate Anaphylaxis   Tuberculin, Ppd    Other reaction(s): Eruption of skin   Sulfa Antibiotics Rash        Medication List     TAKE these medications    acetaminophen 325 MG tablet Commonly known as: TYLENOL Take 650 mg by mouth 2 (two) times daily as needed for mild pain.   amoxicillin-clavulanate 875-125 MG tablet Commonly known as: AUGMENTIN Take 1 tablet by mouth every 12 (twelve) hours for 7 days.   ARIPiprazole 30  MG tablet Commonly known as: ABILIFY Take 15 mg by mouth daily.   Calcium Carbonate 500 MG Chew Chew 500 mg by mouth 2 (two) times daily as needed (gerd).   midodrine 5 MG tablet Commonly known as: PROAMATINE Take 5 mg by mouth 2 (two) times daily as needed (dizziness when sitting up).   mupirocin ointment 2 % Commonly known as: BACTROBAN Place 1 Application into the nose 2 (two) times daily.   OLANZapine 5 MG tablet Commonly known as: ZyPREXA Take 1 tablet (5 mg total) by mouth at bedtime.   omeprazole 40 MG capsule Commonly known as: PRILOSEC Take 40 mg by mouth daily.   pregabalin 150 MG capsule Commonly known as: LYRICA Take 150  mg by mouth 2 (two) times daily.   senna 8.6 MG Tabs tablet Commonly known as: SENOKOT Take 1 tablet (8.6 mg total) by mouth daily for 10 days. Start taking on: May 26, 2022   sertraline 50 MG tablet Commonly known as: ZOLOFT Take 25 mg by mouth daily.   tiZANidine 4 MG tablet Commonly known as: ZANAFLEX Take 4 mg by mouth at bedtime as needed for muscle spasms.   traZODone 50 MG tablet Commonly known as: DESYREL Take 50 mg by mouth at bedtime.   trospium 20 MG tablet Commonly known as: SANCTURA Take 20 mg by mouth 2 (two) times daily.               Discharge Care Instructions  (From admission, onward)           Start     Ordered   05/25/22 0000  Discharge wound care:       Comments: Wound care to left knee full thickness wound:  Cleanse with NS, pat dry. Apply folded layer of xeroform gauze top with dry gauze and secure with silicone foam. Change xeroform daily, may reused silicone foam for up to 3 days. Change PRN rolling of dressing edges.  Wound care  BID Comments: Wound care to sacral and buttock Stage 4 pressure injures, chronic, nonhealing:  Cleanse with NS, pat surrounding skin dry. Fill defects with fluffed, saline moistened gauze, Top with dry gauze, ABD pads and secure with 2-inch paper tape. Change twice  daily and PRN soiling or inadvertent dressing removal.   05/25/22 0953            Allergies  Allergen Reactions   Caffeine Anaphylaxis   Chocolate Anaphylaxis   Tuberculin, Ppd     Other reaction(s): Eruption of skin   Sulfa Antibiotics Rash    The results of significant diagnostics from this hospitalization (including imaging, microbiology, ancillary and laboratory) are listed below for reference.    Microbiology: Recent Results (from the past 240 hour(s))  Blood culture (routine x 2)     Status: None   Collection Time: 05/18/22  1:00 PM   Specimen: BLOOD RIGHT HAND  Result Value Ref Range Status   Specimen Description   Final    BLOOD RIGHT HAND Performed at [facility] [address], [place], [place] [address]    Special Requests   Final    BOTTLES DRAWN AEROBIC AND ANAEROBIC Blood Culture adequate volume Performed at [facility] [address], [place], [place] [address]    Culture   Final    NO GROWTH 5 DAYS Performed at [facility], [place] [address] [place], [place] [address]    Report Status 05/23/2022 FINAL  Final  Blood culture (routine x 2)     Status: None   Collection Time: 05/18/22  1:05 PM   Specimen: BLOOD  Result Value Ref Range Status   Specimen Description   Final    BLOOD BLOOD LEFT FOREARM Performed at [facility] [address] [place], [place] [address]    Special Requests   Final    BOTTLES DRAWN AEROBIC AND ANAEROBIC Blood Culture adequate volume Performed at [facility] [address], [place], [place] [address]    Culture   Final    NO GROWTH 5 DAYS Performed at [facility], [place] [address] [place], [place] [address]    Report Status 05/23/2022 FINAL  Final  Resp Panel by  RT-PCR (Flu A&B, Covid)     Status: None   Collection Time: 05/18/22  1:05 PM   Specimen: Nasal Swab  Result Value Ref Range Status   SARS Coronavirus 2 by RT PCR  NEGATIVE NEGATIVE Final    Comment: (NOTE) SARS-CoV-2 target nucleic acids are NOT DETECTED.  The SARS-CoV-2 RNA is generally detectable in upper respiratory specimens during the acute phase of infection. The lowest concentration of SARS-CoV-2 viral copies this assay can detect is 138 copies/mL. A negative result does not preclude SARS-Cov-2 infection and should not be used as the sole basis for treatment or other patient management decisions. A negative result may occur with  improper specimen collection/handling, submission of specimen other than nasopharyngeal swab, presence of viral mutation(s) within the areas targeted by this assay, and inadequate number of viral copies(<138 copies/mL). A negative result must be combined with clinical observations, patient history, and epidemiological information. The expected result is Negative.  Fact Sheet for Patients:  EntrepreneurPulse.com.au  Fact Sheet for Healthcare Providers:  IncredibleEmployment.be  This test is no t yet approved or cleared by the [place] FDA and  has been authorized for detection and/or diagnosis of SARS-CoV-2 by FDA under an Emergency Use Authorization (EUA). This EUA will remain  in effect (meaning this test can be used) for the duration of the COVID-19 declaration under Section 564(b)(1) of the Act, 21 U.S.C.section 360bbb-3(b)(1), unless the authorization is terminated  or revoked sooner.       Influenza A by PCR NEGATIVE NEGATIVE Final   Influenza B by PCR NEGATIVE NEGATIVE Final    Comment: (NOTE) The Xpert Xpress SARS-CoV-2/FLU/RSV plus assay is intended as an aid in the diagnosis of influenza from Nasopharyngeal swab specimens and should not be used as a sole basis for treatment. Nasal washings and aspirates are unacceptable for Xpert Xpress SARS-CoV-2/FLU/RSV testing.  Fact Sheet for Patients: EntrepreneurPulse.com.au  Fact Sheet for  Healthcare Providers: IncredibleEmployment.be  This test is not yet approved or cleared by the [place] FDA and has been authorized for detection and/or diagnosis of SARS-CoV-2 by FDA under an Emergency Use Authorization (EUA). This EUA will remain in effect (meaning this test can be used) for the duration of the COVID-19 declaration under Section 564(b)(1) of the Act, 21 U.S.C. section 360bbb-3(b)(1), unless the authorization is terminated or revoked.  Performed at [facility], [place] [address] [place], [place] [address]   MRSA Next Gen by PCR, Nasal     Status: Abnormal   Collection Time: 05/22/22  7:30 PM   Specimen: Nasal Mucosa; Nasal Swab  Result Value Ref Range Status   MRSA by PCR Next Gen DETECTED (A) NOT DETECTED Final    Comment: ARTIS,D AT 0126 ON 05/23/22 BY VAZQUEZJ (NOTE) The GeneXpert MRSA Assay (FDA approved for NASAL specimens only), is one component of a comprehensive MRSA colonization surveillance program. It is not intended to diagnose MRSA infection nor to guide or monitor treatment for MRSA infections. Test performance is not FDA approved in patients less than 57 years old. Performed at [facility], [place] [address], [place], [place] [address]     Procedures/Studies: CT ABDOMEN PELVIS W CONTRAST  Result Date: 05/18/2022 CLINICAL DATA:  Abdominal pain, weakness and fatigue. History of decubitus ulcers EXAM: CT ABDOMEN AND PELVIS WITH CONTRAST TECHNIQUE: Multidetector CT imaging of the abdomen and pelvis was performed using the standard protocol following bolus administration of intravenous contrast. RADIATION DOSE REDUCTION: This exam was performed according to the  departmental dose-optimization program which includes automated exposure control, adjustment of the mA and/or kV according to patient size and/or use of iterative reconstruction technique. CONTRAST:  OMNIPAQUE IOHEXOL 300 MG/ML  SOLN  COMPARISON:  CT of the abdomen and pelvis on 05/30/2021 and CT of the pelvis on 01/02/2022 FINDINGS: Lower chest: No acute abnormality.  Small hiatal hernia. Hepatobiliary: Stable hepatomegaly. No overt cirrhotic morphology or hepatic masses. The gallbladder is unremarkable. No biliary ductal dilatation. Pancreas: Unremarkable. No pancreatic ductal dilatation or surrounding inflammatory changes. Spleen: Mild splenomegaly. Adrenals/Urinary Tract: Adrenal glands are unremarkable. Kidneys are normal, without renal calculi, focal lesion, or hydronephrosis. Bladder is unremarkable. Stomach/Bowel: Bowel shows no evidence of obstruction, ileus, inflammation or lesion. The appendix is not visualized. No free intraperitoneal air. Vascular/Lymphatic: No lymphadenopathy identified. There are some visible gastric/short gastric varices which are difficult to follow. Reproductive: Prostate is unremarkable. Other: Chronic decubitus ulcers along the posterior left pelvic region and abutting the posterior proximal thigh. These appears similar to prior studies and there is evidence of chronic sclerosis of the posterior left ischium and pubic bone. Musculoskeletal: Evidence of chronic osteomyelitis of the posterior left pubic bone and ischium. IMPRESSION: 1. Small hiatal hernia. 2. Stable hepatomegaly and associated mild splenomegaly. Some degree of portal hypertension may be present as there appears to be some gastric/short gastric varices. 3. 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 Electronically Signed   By: Irish Lack M.D.   On: 05/18/2022 15:39   DG Knee Complete 4 Views Left  Result Date: 05/18/2022 CLINICAL DATA:  Knee injury. EXAM: LEFT KNEE - COMPLETE 4+ VIEW COMPARISON:  None Available. FINDINGS: Mild medial joint space narrowing. No joint effusion. There is a well corticated ossicle measuring up to 12 mm at the superior aspect of the tibial tubercle, possibly  the sequela of remote Osgood-Schlatter disease. No acute fracture or dislocation. IMPRESSION: 1. Mild medial compartment osteoarthritis. 2. Chronic ossicle at the superior aspect of the tibial tubercle, possibly the sequela of remote Osgood-Schlatter disease. Electronically Signed   By: Neita Garnet M.D.   On: 05/18/2022 13:31   DG Chest 2 View  Result Date: 05/18/2022 CLINICAL DATA:  Shortness of breath. EXAM: CHEST - 2 VIEW COMPARISON:  08/29/2021 FINDINGS: The lungs are clear without focal pneumonia, edema, pneumothorax or pleural effusion. The cardiopericardial silhouette is within normal limits for size. The visualized bony structures of the thorax are unremarkable. Telemetry leads overlie the chest. IMPRESSION: No active cardiopulmonary disease. Electronically Signed   By: Kennith Center M.D.   On: 05/18/2022 13:30    Labs: BNP (last 3 results) No results for input(s): "BNP" in the last 8760 hours. Basic Metabolic Panel: Recent Labs  Lab 05/18/22 1305 05/20/22 0634 05/21/22 0446 05/25/22 0607  NA 131* 136 135  --   K 3.9 3.6 3.9  --   CL 102 108 105  --   CO2 23 23 25   --   GLUCOSE 108* 113* 91  --   BUN 14 10 8   --   CREATININE 0.70 0.65 0.65 0.61  CALCIUM 7.6* 7.5* 8.2*  --   MG 2.1 1.8  --   --   PHOS  --  3.8  --   --    Liver Function Tests: Recent Labs  Lab 05/18/22 1305 05/20/22 0634  AST 8* 6*  ALT 8 9  ALKPHOS 81 64  BILITOT 0.2* 0.2*  PROT 7.4 6.3*  ALBUMIN 2.3* 1.9*  No results for input(s): "LIPASE", "AMYLASE" in the last 168 hours. No results for input(s): "AMMONIA" in the last 168 hours. CBC: Recent Labs  Lab 05/18/22 1305 05/20/22 0634 05/21/22 0446  WBC 8.6 4.2 3.5*  NEUTROABS  --  2.8 1.7  HGB 8.0* 6.6* 8.1*  HCT 25.6* 22.0* 26.4*  MCV 82.8 84.9 83.8  PLT 370 322 318   Cardiac Enzymes: Recent Labs  Lab 05/18/22 1305  CKTOTAL 114   BNP: Invalid input(s): "POCBNP" CBG: No results for input(s): "GLUCAP" in the last 168  hours. D-Dimer No results for input(s): "DDIMER" in the last 72 hours. Hgb A1c No results for input(s): "HGBA1C" in the last 72 hours. Lipid Profile No results for input(s): "CHOL", "HDL", "LDLCALC", "TRIG", "CHOLHDL", "LDLDIRECT" in the last 72 hours. Thyroid function studies No results for input(s): "TSH", "T4TOTAL", "T3FREE", "THYROIDAB" in the last 72 hours.  Invalid input(s): "FREET3" Anemia work up No results for input(s): "VITAMINB12", "FOLATE", "FERRITIN", "TIBC", "IRON", "RETICCTPCT" in the last 72 hours. Urinalysis    Component Value Date/Time   COLORURINE YELLOW 05/18/2022 1448   APPEARANCEUR HAZY (A) 05/18/2022 1448   APPEARANCEUR Hazy 05/08/2013 0330   LABSPEC 1.033 (H) 05/18/2022 1448   LABSPEC 1.034 05/08/2013 0330   PHURINE 5.0 05/18/2022 1448   GLUCOSEU NEGATIVE 05/18/2022 1448   GLUCOSEU Negative 05/08/2013 0330   HGBUR SMALL (A) 05/18/2022 1448   BILIRUBINUR NEGATIVE 05/18/2022 1448   BILIRUBINUR 1+ 05/08/2013 0330   KETONESUR NEGATIVE 05/18/2022 1448   PROTEINUR 30 (A) 05/18/2022 1448   NITRITE NEGATIVE 05/18/2022 1448   LEUKOCYTESUR SMALL (A) 05/18/2022 1448   LEUKOCYTESUR Negative 05/08/2013 0330   Sepsis Labs Recent Labs  Lab 05/18/22 1305 05/20/22 0634 05/21/22 0446  WBC 8.6 4.2 3.5*   Microbiology Recent Results (from the past 240 hour(s))  Blood culture (routine x 2)     Status: None   Collection Time: 05/18/22  1:00 PM   Specimen: BLOOD RIGHT HAND  Result Value Ref Range Status   Specimen Description   Final    BLOOD RIGHT HAND Performed at [facility], [place] [address] [place], [place] [address]    Special Requests   Final    BOTTLES DRAWN AEROBIC AND ANAEROBIC Blood Culture adequate volume Performed at [facility] [address] [place], [place] [address]    Culture   Final    NO GROWTH 5 DAYS Performed at [facility], [place] [address], [place], [place] [address]    Report Status  05/23/2022 FINAL  Final  Blood culture (routine x 2)     Status: None   Collection Time: 05/18/22  1:05 PM   Specimen: BLOOD  Result Value Ref Range Status   Specimen Description   Final    BLOOD BLOOD LEFT FOREARM Performed at [facility] [address], [place], [place] [address]    Special Requests   Final    BOTTLES DRAWN AEROBIC AND ANAEROBIC Blood Culture adequate volume Performed at [facility] [address] [place], [place] [address]    Culture   Final    NO GROWTH 5 DAYS Performed at [facility], [place] [address] [place], [place] [address]    Report Status 05/23/2022 FINAL  Final  Resp Panel by RT-PCR (Flu A&B, Covid)     Status: None   Collection Time: 05/18/22  1:05 PM   Specimen: Nasal Swab  Result Value Ref Range Status   SARS  Coronavirus 2 by RT PCR NEGATIVE NEGATIVE Final    Comment: (NOTE) SARS-CoV-2 target nucleic acids are NOT DETECTED.  The SARS-CoV-2 RNA is generally detectable in upper respiratory specimens during the acute phase of infection. The lowest concentration of SARS-CoV-2 viral copies this assay can detect is 138 copies/mL. A negative result does not preclude SARS-Cov-2 infection and should not be used as the sole basis for treatment or other patient management decisions. A negative result may occur with  improper specimen collection/handling, submission of specimen other than nasopharyngeal swab, presence of viral mutation(s) within the areas targeted by this assay, and inadequate number of viral copies(<138 copies/mL). A negative result must be combined with clinical observations, patient history, and epidemiological information. The expected result is Negative.  Fact Sheet for Patients:  EntrepreneurPulse.com.au  Fact Sheet for Healthcare Providers:  IncredibleEmployment.be  This test is no t yet approved or cleared by the [place] FDA and  has been  authorized for detection and/or diagnosis of SARS-CoV-2 by FDA under an Emergency Use Authorization (EUA). This EUA will remain  in effect (meaning this test can be used) for the duration of the COVID-19 declaration under Section 564(b)(1) of the Act, 21 U.S.C.section 360bbb-3(b)(1), unless the authorization is terminated  or revoked sooner.       Influenza A by PCR NEGATIVE NEGATIVE Final   Influenza B by PCR NEGATIVE NEGATIVE Final    Comment: (NOTE) The Xpert Xpress SARS-CoV-2/FLU/RSV plus assay is intended as an aid in the diagnosis of influenza from Nasopharyngeal swab specimens and should not be used as a sole basis for treatment. Nasal washings and aspirates are unacceptable for Xpert Xpress SARS-CoV-2/FLU/RSV testing.  Fact Sheet for Patients: EntrepreneurPulse.com.au  Fact Sheet for Healthcare Providers: IncredibleEmployment.be  This test is not yet approved or cleared by the [place] FDA and has been authorized for detection and/or diagnosis of SARS-CoV-2 by FDA under an Emergency Use Authorization (EUA). This EUA will remain in effect (meaning this test can be used) for the duration of the COVID-19 declaration under Section 564(b)(1) of the Act, 21 U.S.C. section 360bbb-3(b)(1), unless the authorization is terminated or revoked.  Performed at [facility], [place] [address], [place], [place] [address]   MRSA Next Gen by PCR, Nasal     Status: Abnormal   Collection Time: 05/22/22  7:30 PM   Specimen: Nasal Mucosa; Nasal Swab  Result Value Ref Range Status   MRSA by PCR Next Gen DETECTED (A) NOT DETECTED Final    Comment: ARTIS,D AT 0126 ON 05/23/22 BY [email or web address] (NOTE) The GeneXpert MRSA Assay (FDA approved for NASAL specimens only), is one component of a comprehensive MRSA colonization surveillance program. It is not intended to diagnose MRSA infection nor to guide or monitor treatment for MRSA  infections. Test performance is not FDA approved in patients less than 59 years old. Performed at [facility], [place] [address], [place], [place] [address]      Time coordinating discharge: 25 minutes  SIGNED: Antonieta Pert, MD  [facility] 05/25/2022, 9:53 AM  If 7PM-7AM, please contact night-coverage [email or web address]

## 2022-06-23 ENCOUNTER — Observation Stay (HOSPITAL_COMMUNITY): Payer: No Typology Code available for payment source

## 2022-06-23 ENCOUNTER — Inpatient Hospital Stay (HOSPITAL_COMMUNITY)
Admission: EM | Admit: 2022-06-23 | Discharge: 2022-07-01 | DRG: 871 | Disposition: A | Discharge status: Home Health | Payer: No Typology Code available for payment source | Attending: Family Medicine | Admitting: Family Medicine

## 2022-06-23 ENCOUNTER — Encounter (HOSPITAL_COMMUNITY): Payer: Self-pay

## 2022-06-23 ENCOUNTER — Emergency Department (HOSPITAL_COMMUNITY): Payer: No Typology Code available for payment source

## 2022-06-23 ENCOUNTER — Other Ambulatory Visit: Payer: Self-pay

## 2022-06-23 DIAGNOSIS — R6510 Systemic inflammatory response syndrome (SIRS) of non-infectious origin without acute organ dysfunction: Secondary | ICD-10-CM | POA: Diagnosis present

## 2022-06-23 DIAGNOSIS — F1510 Other stimulant abuse, uncomplicated: Secondary | ICD-10-CM | POA: Diagnosis present

## 2022-06-23 DIAGNOSIS — M869 Osteomyelitis, unspecified: Secondary | ICD-10-CM | POA: Diagnosis present

## 2022-06-23 DIAGNOSIS — E440 Moderate protein-calorie malnutrition: Secondary | ICD-10-CM | POA: Diagnosis present

## 2022-06-23 DIAGNOSIS — G928 Other toxic encephalopathy: Secondary | ICD-10-CM | POA: Diagnosis present

## 2022-06-23 DIAGNOSIS — N3000 Acute cystitis without hematuria: Secondary | ICD-10-CM

## 2022-06-23 DIAGNOSIS — M62838 Other muscle spasm: Secondary | ICD-10-CM | POA: Diagnosis present

## 2022-06-23 DIAGNOSIS — F17200 Nicotine dependence, unspecified, uncomplicated: Secondary | ICD-10-CM | POA: Diagnosis present

## 2022-06-23 DIAGNOSIS — F15951 Other stimulant use, unspecified with stimulant-induced psychotic disorder with hallucinations: Secondary | ICD-10-CM | POA: Diagnosis present

## 2022-06-23 DIAGNOSIS — R7881 Bacteremia: Secondary | ICD-10-CM | POA: Diagnosis not present

## 2022-06-23 DIAGNOSIS — L089 Local infection of the skin and subcutaneous tissue, unspecified: Secondary | ICD-10-CM

## 2022-06-23 DIAGNOSIS — L0390 Cellulitis, unspecified: Secondary | ICD-10-CM | POA: Diagnosis not present

## 2022-06-23 DIAGNOSIS — I517 Cardiomegaly: Secondary | ICD-10-CM | POA: Diagnosis not present

## 2022-06-23 DIAGNOSIS — F200 Paranoid schizophrenia: Secondary | ICD-10-CM | POA: Diagnosis present

## 2022-06-23 DIAGNOSIS — Z23 Encounter for immunization: Secondary | ICD-10-CM

## 2022-06-23 DIAGNOSIS — E871 Hypo-osmolality and hyponatremia: Secondary | ICD-10-CM | POA: Diagnosis present

## 2022-06-23 DIAGNOSIS — F32A Depression, unspecified: Secondary | ICD-10-CM | POA: Diagnosis present

## 2022-06-23 DIAGNOSIS — F17210 Nicotine dependence, cigarettes, uncomplicated: Secondary | ICD-10-CM | POA: Diagnosis not present

## 2022-06-23 DIAGNOSIS — R823 Hemoglobinuria: Secondary | ICD-10-CM | POA: Diagnosis present

## 2022-06-23 DIAGNOSIS — L89154 Pressure ulcer of sacral region, stage 4: Secondary | ICD-10-CM | POA: Diagnosis present

## 2022-06-23 DIAGNOSIS — D638 Anemia in other chronic diseases classified elsewhere: Secondary | ICD-10-CM | POA: Diagnosis present

## 2022-06-23 DIAGNOSIS — D62 Acute posthemorrhagic anemia: Secondary | ICD-10-CM | POA: Diagnosis present

## 2022-06-23 DIAGNOSIS — D75839 Thrombocytosis, unspecified: Secondary | ICD-10-CM | POA: Diagnosis present

## 2022-06-23 DIAGNOSIS — F15151 Other stimulant abuse with stimulant-induced psychotic disorder with hallucinations: Secondary | ICD-10-CM | POA: Diagnosis present

## 2022-06-23 DIAGNOSIS — N390 Urinary tract infection, site not specified: Secondary | ICD-10-CM | POA: Diagnosis present

## 2022-06-23 DIAGNOSIS — L03116 Cellulitis of left lower limb: Secondary | ICD-10-CM | POA: Diagnosis present

## 2022-06-23 DIAGNOSIS — A4102 Sepsis due to Methicillin resistant Staphylococcus aureus: Secondary | ICD-10-CM | POA: Diagnosis present

## 2022-06-23 DIAGNOSIS — B9561 Methicillin susceptible Staphylococcus aureus infection as the cause of diseases classified elsewhere: Secondary | ICD-10-CM | POA: Diagnosis not present

## 2022-06-23 DIAGNOSIS — B9620 Unspecified Escherichia coli [E. coli] as the cause of diseases classified elsewhere: Secondary | ICD-10-CM | POA: Diagnosis present

## 2022-06-23 DIAGNOSIS — D649 Anemia, unspecified: Secondary | ICD-10-CM | POA: Diagnosis present

## 2022-06-23 DIAGNOSIS — F1220 Cannabis dependence, uncomplicated: Secondary | ICD-10-CM | POA: Insufficient documentation

## 2022-06-23 DIAGNOSIS — E876 Hypokalemia: Secondary | ICD-10-CM | POA: Diagnosis not present

## 2022-06-23 DIAGNOSIS — M542 Cervicalgia: Secondary | ICD-10-CM | POA: Diagnosis present

## 2022-06-23 DIAGNOSIS — F419 Anxiety disorder, unspecified: Secondary | ICD-10-CM | POA: Diagnosis present

## 2022-06-23 DIAGNOSIS — Z79899 Other long term (current) drug therapy: Secondary | ICD-10-CM

## 2022-06-23 DIAGNOSIS — Z882 Allergy status to sulfonamides status: Secondary | ICD-10-CM

## 2022-06-23 DIAGNOSIS — Z887 Allergy status to serum and vaccine status: Secondary | ICD-10-CM

## 2022-06-23 DIAGNOSIS — R579 Shock, unspecified: Secondary | ICD-10-CM | POA: Diagnosis not present

## 2022-06-23 DIAGNOSIS — A419 Sepsis, unspecified organism: Secondary | ICD-10-CM | POA: Diagnosis not present

## 2022-06-23 DIAGNOSIS — B9562 Methicillin resistant Staphylococcus aureus infection as the cause of diseases classified elsewhere: Secondary | ICD-10-CM | POA: Diagnosis not present

## 2022-06-23 DIAGNOSIS — Z6822 Body mass index (BMI) 22.0-22.9, adult: Secondary | ICD-10-CM

## 2022-06-23 DIAGNOSIS — Z8659 Personal history of other mental and behavioral disorders: Secondary | ICD-10-CM

## 2022-06-23 DIAGNOSIS — Z818 Family history of other mental and behavioral disorders: Secondary | ICD-10-CM

## 2022-06-23 DIAGNOSIS — I340 Nonrheumatic mitral (valve) insufficiency: Secondary | ICD-10-CM | POA: Diagnosis not present

## 2022-06-23 DIAGNOSIS — Z91018 Allergy to other foods: Secondary | ICD-10-CM

## 2022-06-23 DIAGNOSIS — L89220 Pressure ulcer of left hip, unstageable: Secondary | ICD-10-CM | POA: Diagnosis present

## 2022-06-23 DIAGNOSIS — G8250 Quadriplegia, unspecified: Secondary | ICD-10-CM | POA: Diagnosis not present

## 2022-06-23 DIAGNOSIS — F418 Other specified anxiety disorders: Secondary | ICD-10-CM | POA: Diagnosis not present

## 2022-06-23 DIAGNOSIS — M86352 Chronic multifocal osteomyelitis, left femur: Secondary | ICD-10-CM | POA: Diagnosis present

## 2022-06-23 DIAGNOSIS — F4312 Post-traumatic stress disorder, chronic: Secondary | ICD-10-CM | POA: Diagnosis not present

## 2022-06-23 DIAGNOSIS — Z9884 Bariatric surgery status: Secondary | ICD-10-CM

## 2022-06-23 DIAGNOSIS — F15251 Other stimulant dependence with stimulant-induced psychotic disorder with hallucinations: Secondary | ICD-10-CM | POA: Diagnosis not present

## 2022-06-23 DIAGNOSIS — E861 Hypovolemia: Secondary | ICD-10-CM | POA: Diagnosis present

## 2022-06-23 DIAGNOSIS — Z9102 Food additives allergy status: Secondary | ICD-10-CM

## 2022-06-23 DIAGNOSIS — Z9151 Personal history of suicidal behavior: Secondary | ICD-10-CM

## 2022-06-23 DIAGNOSIS — R6521 Severe sepsis with septic shock: Secondary | ICD-10-CM | POA: Diagnosis present

## 2022-06-23 DIAGNOSIS — K219 Gastro-esophageal reflux disease without esophagitis: Secondary | ICD-10-CM | POA: Diagnosis present

## 2022-06-23 DIAGNOSIS — L89324 Pressure ulcer of left buttock, stage 4: Secondary | ICD-10-CM | POA: Diagnosis present

## 2022-06-23 DIAGNOSIS — Z20822 Contact with and (suspected) exposure to covid-19: Secondary | ICD-10-CM | POA: Diagnosis present

## 2022-06-23 LAB — BLOOD GAS, VENOUS
Acid-Base Excess: 0.1 mmol/L (ref 0.0–2.0)
Bicarbonate: 24.7 mmol/L (ref 20.0–28.0)
O2 Saturation: 84.3 %
Patient temperature: 37.7
pCO2, Ven: 40 mmHg — ABNORMAL LOW (ref 44–60)
pH, Ven: 7.4 (ref 7.25–7.43)
pO2, Ven: 53 mmHg — ABNORMAL HIGH (ref 32–45)

## 2022-06-23 LAB — BLOOD CULTURE ID PANEL (REFLEXED) - BCID2

## 2022-06-23 LAB — BASIC METABOLIC PANEL
Anion gap: 6 (ref 5–15)
BUN: 11 mg/dL (ref 6–20)
CO2: 23 mmol/L (ref 22–32)
Calcium: 7.8 mg/dL — ABNORMAL LOW (ref 8.9–10.3)
Chloride: 105 mmol/L (ref 98–111)
Creatinine, Ser: 0.67 mg/dL (ref 0.61–1.24)
GFR, Estimated: >60 mL/min (ref >60)
Glucose, Bld: 98 mg/dL (ref 70–99)
Potassium: 3.5 mmol/L (ref 3.5–5.1)
Sodium: 134 mmol/L — ABNORMAL LOW (ref 135–145)

## 2022-06-23 LAB — CBC WITH DIFFERENTIAL/PLATELET
Abs Immature Granulocytes: 0.06 10*3/uL (ref 0.00–0.07)
Basophils Absolute: 0 10*3/uL (ref 0.0–0.1)
Basophils Relative: 0 %
Eosinophils Absolute: 0.2 10*3/uL (ref 0.0–0.5)
Eosinophils Relative: 2 %
HCT: 25.6 % — ABNORMAL LOW (ref 39.0–52.0)
Hemoglobin: 7.9 g/dL — ABNORMAL LOW (ref 13.0–17.0)
Immature Granulocytes: 1 %
Lymphocytes Relative: 18 %
Lymphs Abs: 1.7 10*3/uL (ref 0.7–4.0)
MCH: 25.7 pg — ABNORMAL LOW (ref 26.0–34.0)
MCHC: 30.9 g/dL (ref 30.0–36.0)
MCV: 83.4 fL (ref 80.0–100.0)
Monocytes Absolute: 0.7 10*3/uL (ref 0.1–1.0)
Monocytes Relative: 7 %
Neutro Abs: 6.9 10*3/uL (ref 1.7–7.7)
Neutrophils Relative %: 72 %
Platelets: 462 10*3/uL — ABNORMAL HIGH (ref 150–400)
RBC: 3.07 MIL/uL — ABNORMAL LOW (ref 4.22–5.81)
RDW: 16.8 % — ABNORMAL HIGH (ref 11.5–15.5)
WBC: 9.6 10*3/uL (ref 4.0–10.5)
nRBC: 0 % (ref 0.0–0.2)

## 2022-06-23 LAB — CBC
HCT: 18.8 % — ABNORMAL LOW (ref 39.0–52.0)
HCT: 21.2 % — ABNORMAL LOW (ref 39.0–52.0)
Hemoglobin: 5.7 g/dL — CL (ref 13.0–17.0)
Hemoglobin: 6.3 g/dL — CL (ref 13.0–17.0)
MCH: 25.7 pg — ABNORMAL LOW (ref 26.0–34.0)
MCH: 25.5 pg — ABNORMAL LOW (ref 26.0–34.0)
MCHC: 30.3 g/dL (ref 30.0–36.0)
MCHC: 29.7 g/dL — ABNORMAL LOW (ref 30.0–36.0)
MCV: 84.7 fL (ref 80.0–100.0)
MCV: 85.8 fL (ref 80.0–100.0)
Platelets: 268 10*3/uL (ref 150–400)
Platelets: 352 10*3/uL (ref 150–400)
RBC: 2.22 MIL/uL — ABNORMAL LOW (ref 4.22–5.81)
RBC: 2.47 MIL/uL — ABNORMAL LOW (ref 4.22–5.81)
RDW: 16.8 % — ABNORMAL HIGH (ref 11.5–15.5)
RDW: 16.8 % — ABNORMAL HIGH (ref 11.5–15.5)
WBC: 5.8 10*3/uL (ref 4.0–10.5)
WBC: 6.7 10*3/uL (ref 4.0–10.5)
nRBC: 0 % (ref 0.0–0.2)
nRBC: 0 % (ref 0.0–0.2)

## 2022-06-23 LAB — URINALYSIS, ROUTINE W REFLEX MICROSCOPIC
Bilirubin Urine: NEGATIVE
Glucose, UA: NEGATIVE mg/dL
Ketones, ur: NEGATIVE mg/dL
Nitrite: POSITIVE — AB
Protein, ur: 100 mg/dL — AB
Specific Gravity, Urine: 1.028 (ref 1.005–1.030)
WBC, UA: >50 WBC/hpf — ABNORMAL HIGH (ref 0–5)
pH: 5 (ref 5.0–8.0)

## 2022-06-23 LAB — COMPREHENSIVE METABOLIC PANEL
ALT: 11 U/L (ref 0–44)
AST: 13 U/L — ABNORMAL LOW (ref 15–41)
Albumin: 2.5 g/dL — ABNORMAL LOW (ref 3.5–5.0)
Alkaline Phosphatase: 96 U/L (ref 38–126)
Anion gap: 8 (ref 5–15)
BUN: 13 mg/dL (ref 6–20)
CO2: 24 mmol/L (ref 22–32)
Calcium: 8.4 mg/dL — ABNORMAL LOW (ref 8.9–10.3)
Chloride: 102 mmol/L (ref 98–111)
Creatinine, Ser: 0.73 mg/dL (ref 0.61–1.24)
GFR, Estimated: >60 mL/min (ref >60)
Glucose, Bld: 115 mg/dL — ABNORMAL HIGH (ref 70–99)
Potassium: 3.6 mmol/L (ref 3.5–5.1)
Sodium: 134 mmol/L — ABNORMAL LOW (ref 135–145)
Total Bilirubin: 0.5 mg/dL (ref 0.3–1.2)
Total Protein: 7.9 g/dL (ref 6.5–8.1)

## 2022-06-23 LAB — DIC (DISSEMINATED INTRAVASCULAR COAGULATION)PANEL
D-Dimer, Quant: 1.8 ug/mL-FEU — ABNORMAL HIGH (ref 0.00–0.50)
Fibrinogen: 742 mg/dL — ABNORMAL HIGH (ref 210–475)
INR: 1.3 — ABNORMAL HIGH (ref 0.8–1.2)
Platelets: 311 10*3/uL (ref 150–400)
Prothrombin Time: 15.8 seconds — ABNORMAL HIGH (ref 11.4–15.2)
Smear Review: NONE SEEN
aPTT: 42 seconds — ABNORMAL HIGH (ref 24–36)

## 2022-06-23 LAB — RESP PANEL BY RT-PCR (FLU A&B, COVID) ARPGX2
Influenza A by PCR: NEGATIVE
Influenza B by PCR: NEGATIVE
SARS Coronavirus 2 by RT PCR: NEGATIVE

## 2022-06-23 LAB — LACTIC ACID, PLASMA
Lactic Acid, Venous: 1.9 mmol/L (ref 0.5–1.9)
Lactic Acid, Venous: 0.9 mmol/L (ref 0.5–1.9)
Lactic Acid, Venous: 1.3 mmol/L (ref 0.5–1.9)
Lactic Acid, Venous: 0.7 mmol/L (ref 0.5–1.9)

## 2022-06-23 LAB — RAPID URINE DRUG SCREEN, HOSP PERFORMED
Amphetamines: POSITIVE — AB
Barbiturates: NOT DETECTED
Benzodiazepines: NOT DETECTED
Cocaine: NOT DETECTED
Opiates: NOT DETECTED
Tetrahydrocannabinol: NOT DETECTED

## 2022-06-23 LAB — CK: Total CK: 26 U/L — ABNORMAL LOW (ref 49–397)

## 2022-06-23 LAB — APTT: aPTT: 39 seconds — ABNORMAL HIGH (ref 24–36)

## 2022-06-23 LAB — PROTIME-INR
INR: 1.2 (ref 0.8–1.2)
Prothrombin Time: 15.2 seconds (ref 11.4–15.2)

## 2022-06-23 LAB — PREPARE RBC (CROSSMATCH)

## 2022-06-23 LAB — MAGNESIUM: Magnesium: 1.7 mg/dL (ref 1.7–2.4)

## 2022-06-23 LAB — AMMONIA: Ammonia: 21 umol/L (ref 9–35)

## 2022-06-23 LAB — PHOSPHORUS: Phosphorus: 3.8 mg/dL (ref 2.5–4.6)

## 2022-06-23 LAB — MRSA NEXT GEN BY PCR, NASAL: MRSA by PCR Next Gen: DETECTED — AB

## 2022-06-23 MED ORDER — METRONIDAZOLE 500 MG/100ML IV SOLN
500.0000 mg | Freq: Two times a day (BID) | INTRAVENOUS | Status: DC
  Administered 2022-06-23: 500 mg via INTRAVENOUS
  Filled 2022-06-23: qty 100

## 2022-06-23 MED ORDER — LACTATED RINGERS IV BOLUS
1000.0000 mL | Freq: Once | INTRAVENOUS | Status: AC
  Administered 2022-06-23: 1000 mL via INTRAVENOUS

## 2022-06-23 MED ORDER — TETANUS-DIPHTH-ACELL PERTUSSIS 5-2.5-18.5 LF-MCG/0.5 IM SUSY
0.5000 mL | PREFILLED_SYRINGE | Freq: Once | INTRAMUSCULAR | Status: AC
  Administered 2022-06-23: 0.5 mL via INTRAMUSCULAR
  Filled 2022-06-23: qty 0.5

## 2022-06-23 MED ORDER — LACTATED RINGERS IV BOLUS (SEPSIS)
250.0000 mL | Freq: Once | INTRAVENOUS | Status: AC
  Administered 2022-06-23: 250 mL via INTRAVENOUS

## 2022-06-23 MED ORDER — SODIUM CHLORIDE 0.9 % IV SOLN: INTRAVENOUS | Status: DC | PRN

## 2022-06-23 MED ORDER — ONDANSETRON HCL 4 MG PO TABS: 4.0000 mg | ORAL_TABLET | Freq: Four times a day (QID) | ORAL | Status: DC | PRN

## 2022-06-23 MED ORDER — VANCOMYCIN HCL IN DEXTROSE 1-5 GM/200ML-% IV SOLN
1000.0000 mg | Freq: Once | INTRAVENOUS | Status: AC
  Administered 2022-06-23: 1000 mg via INTRAVENOUS
  Filled 2022-06-23: qty 200

## 2022-06-23 MED ORDER — METHOCARBAMOL 1000 MG/10ML IJ SOLN
1000.0000 mg | Freq: Once | INTRAVENOUS | Status: AC
  Administered 2022-06-23: 1000 mg via INTRAVENOUS
  Filled 2022-06-23: qty 1000

## 2022-06-23 MED ORDER — SODIUM CHLORIDE 0.9 % IV SOLN: 250.0000 mL | INTRAVENOUS | Status: DC

## 2022-06-23 MED ORDER — LACTATED RINGERS IV BOLUS (SEPSIS)
1000.0000 mL | Freq: Once | INTRAVENOUS | Status: AC
  Administered 2022-06-23: 1000 mL via INTRAVENOUS

## 2022-06-23 MED ORDER — TIZANIDINE HCL 4 MG PO TABS
4.0000 mg | ORAL_TABLET | Freq: Every evening | ORAL | Status: DC | PRN
  Administered 2022-06-24 – 2022-06-27 (×3): 4 mg via ORAL
  Filled 2022-06-23 (×3): qty 1

## 2022-06-23 MED ORDER — ORAL CARE MOUTH RINSE: 15.0000 mL | OROMUCOSAL | Status: DC | PRN

## 2022-06-23 MED ORDER — NOREPINEPHRINE 4 MG/250ML-% IV SOLN
2.0000 ug/min | INTRAVENOUS | Status: DC
  Administered 2022-06-23 – 2022-06-24 (×2): 2 ug/min via INTRAVENOUS
  Filled 2022-06-23 (×2): qty 250

## 2022-06-23 MED ORDER — LACTATED RINGERS IV SOLN: INTRAVENOUS | Status: DC

## 2022-06-23 MED ORDER — PREGABALIN 75 MG PO CAPS
150.0000 mg | ORAL_CAPSULE | Freq: Two times a day (BID) | ORAL | Status: DC
  Administered 2022-06-23 – 2022-07-01 (×17): 150 mg via ORAL
  Filled 2022-06-23 (×13): qty 2
  Filled 2022-06-23: qty 3
  Filled 2022-06-23 (×3): qty 2

## 2022-06-23 MED ORDER — TRAZODONE HCL 50 MG PO TABS
50.0000 mg | ORAL_TABLET | Freq: Every evening | ORAL | Status: DC | PRN
  Administered 2022-06-24: 50 mg via ORAL
  Filled 2022-06-23: qty 1

## 2022-06-23 MED ORDER — SODIUM CHLORIDE 0.9 % IV SOLN
2.0000 g | Freq: Three times a day (TID) | INTRAVENOUS | Status: DC
  Administered 2022-06-23: 2 g via INTRAVENOUS
  Filled 2022-06-23: qty 12.5

## 2022-06-23 MED ORDER — ARIPIPRAZOLE 10 MG PO TABS
15.0000 mg | ORAL_TABLET | Freq: Every day | ORAL | Status: DC
  Administered 2022-06-23 – 2022-07-01 (×9): 15 mg via ORAL
  Filled 2022-06-23 (×2): qty 1
  Filled 2022-06-23: qty 2
  Filled 2022-06-23 (×6): qty 1

## 2022-06-23 MED ORDER — ACETAMINOPHEN 650 MG RE SUPP: 650.0000 mg | Freq: Four times a day (QID) | RECTAL | Status: DC | PRN

## 2022-06-23 MED ORDER — FENTANYL CITRATE PF 50 MCG/ML IJ SOSY
25.0000 ug | PREFILLED_SYRINGE | INTRAMUSCULAR | Status: AC | PRN
  Administered 2022-06-23 – 2022-06-24 (×3): 25 ug via INTRAVENOUS
  Filled 2022-06-23 (×3): qty 1

## 2022-06-23 MED ORDER — LORAZEPAM 2 MG/ML IJ SOLN
0.7500 mg | Freq: Once | INTRAMUSCULAR | Status: AC
  Administered 2022-06-23: 0.75 mg via INTRAVENOUS
  Filled 2022-06-23: qty 1

## 2022-06-23 MED ORDER — VANCOMYCIN HCL 1250 MG/250ML IV SOLN
1250.0000 mg | Freq: Two times a day (BID) | INTRAVENOUS | Status: DC
  Administered 2022-06-23 – 2022-06-24 (×2): 1250 mg via INTRAVENOUS
  Filled 2022-06-23 (×2): qty 250

## 2022-06-23 MED ORDER — MIDODRINE HCL 5 MG PO TABS
5.0000 mg | ORAL_TABLET | Freq: Two times a day (BID) | ORAL | Status: DC
  Administered 2022-06-23 – 2022-06-30 (×14): 5 mg via ORAL
  Filled 2022-06-23 (×15): qty 1

## 2022-06-23 MED ORDER — METHOCARBAMOL 1000 MG/10ML IJ SOLN: 1000.0000 mg | Freq: Once | INTRAMUSCULAR | Status: DC

## 2022-06-23 MED ORDER — CHLORHEXIDINE GLUCONATE CLOTH 2 % EX PADS
6.0000 | MEDICATED_PAD | Freq: Every day | CUTANEOUS | Status: DC
  Administered 2022-06-23 – 2022-07-01 (×7): 6 via TOPICAL

## 2022-06-23 MED ORDER — PANTOPRAZOLE SODIUM 40 MG PO TBEC
40.0000 mg | DELAYED_RELEASE_TABLET | Freq: Every day | ORAL | Status: DC
  Administered 2022-06-23 – 2022-07-01 (×9): 40 mg via ORAL
  Filled 2022-06-23 (×9): qty 1

## 2022-06-23 MED ORDER — ACETAMINOPHEN 325 MG PO TABS
650.0000 mg | ORAL_TABLET | Freq: Four times a day (QID) | ORAL | Status: DC | PRN
  Administered 2022-06-23 – 2022-06-30 (×2): 650 mg via ORAL
  Filled 2022-06-23 (×2): qty 2

## 2022-06-23 MED ORDER — SODIUM CHLORIDE 0.9% IV SOLUTION: Freq: Once | INTRAVENOUS | Status: AC

## 2022-06-23 MED ORDER — SODIUM CHLORIDE 0.9 % IV SOLN
1.0000 g | Freq: Three times a day (TID) | INTRAVENOUS | Status: DC
  Administered 2022-06-23 – 2022-06-25 (×5): 1 g via INTRAVENOUS
  Filled 2022-06-23 (×6): qty 20

## 2022-06-23 MED ORDER — SERTRALINE HCL 25 MG PO TABS
25.0000 mg | ORAL_TABLET | Freq: Every day | ORAL | Status: DC
  Administered 2022-06-23 – 2022-06-30 (×8): 25 mg via ORAL
  Filled 2022-06-23 (×8): qty 1

## 2022-06-23 MED ORDER — KETOROLAC TROMETHAMINE 30 MG/ML IJ SOLN
15.0000 mg | Freq: Once | INTRAMUSCULAR | Status: AC
  Administered 2022-06-23: 15 mg via INTRAVENOUS
  Filled 2022-06-23: qty 1

## 2022-06-23 MED ORDER — CHLORHEXIDINE GLUCONATE CLOTH 2 % EX PADS
6.0000 | MEDICATED_PAD | Freq: Every day | CUTANEOUS | Status: AC
  Administered 2022-06-25 – 2022-06-28 (×4): 6 via TOPICAL

## 2022-06-23 MED ORDER — SODIUM CHLORIDE 0.9 % IV SOLN
2.0000 g | Freq: Once | INTRAVENOUS | Status: AC
  Administered 2022-06-23: 2 g via INTRAVENOUS
  Filled 2022-06-23: qty 12.5

## 2022-06-23 MED ORDER — METRONIDAZOLE 500 MG/100ML IV SOLN
500.0000 mg | Freq: Once | INTRAVENOUS | Status: AC
  Administered 2022-06-23: 500 mg via INTRAVENOUS
  Filled 2022-06-23: qty 100

## 2022-06-23 MED ORDER — ONDANSETRON HCL 4 MG/2ML IJ SOLN: 4.0000 mg | Freq: Four times a day (QID) | INTRAMUSCULAR | Status: DC | PRN

## 2022-06-23 MED ORDER — IOHEXOL 300 MG/ML  SOLN
100.0000 mL | Freq: Once | INTRAMUSCULAR | Status: AC | PRN
  Administered 2022-06-23: 100 mL via INTRAVENOUS

## 2022-06-23 MED ORDER — MUPIROCIN 2 % EX OINT
1.0000 | TOPICAL_OINTMENT | Freq: Two times a day (BID) | CUTANEOUS | Status: AC
  Administered 2022-06-23 – 2022-06-28 (×10): 1 via NASAL
  Filled 2022-06-23: qty 22

## 2022-06-23 NOTE — Progress Notes (Signed)
eLink Physician-Brief Progress Note Patient Name: Steve [name] DOB: May 31, 1991 MRN: [id number]   Date of Service  06/23/2022  HPI/Events of Note  Nursing unable to reach family for blood administration consent. Patient on a Norepinephrine IV infusion for hypotension.   eICU Interventions  Will proceed with transfusion of 1 unit PRBC as a medical necessity.     Intervention Category Major Interventions: Other:  [name] 06/23/2022, 8:48 PM

## 2022-06-23 NOTE — Progress Notes (Signed)
A consult was received from an ED physician for vancomycin and cefepime per pharmacy dosing.  The patient's profile has been reviewed for ht/wt/allergies/indication/available labs.   A one time order has been placed for vancomycin 1gm and cefepime 2gm.    Further antibiotics/pharmacy consults should be ordered by admitting physician if indicated.                       Thank you, Dolly Rias RPh 06/23/2022, 5:10 AM

## 2022-06-23 NOTE — Progress Notes (Signed)
Patient ID: Steve [name], male   DOB: 08/23/1991, 31 y.o.   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  Attempts self-care, but it does not appear that the dressings are being adequately managed.    Patient refused CT scan  Previous imaging showed chronic osteomyelitis.  WBC normal  Would ask [facility] to reevaluate the wounds to see if any surgical debridement might be necessary.  [name], MD, [facility]  General Surgery   06/23/2022 10:36 AM

## 2022-06-23 NOTE — Consult Note (Addendum)
NAME:  Steve [name], MRN:  [id number], DOB:  Apr 30, 1991, LOS: 0 ADMISSION DATE:  06/23/2022, CONSULTATION DATE:  06/23/22 REFERRING MD:  Olevia Bowens, CHIEF COMPLAINT:  leg pain   History of Present Illness:  31yM with history of anxiety, depression, schizophrenia, paraplegia - does in/out cath at home, PTSD who was BIBEMS for worsening leg spasms over last couple of weeks. Much of history is obtained through chart review as he is relatively lethargic at time of my interview. He has had chills, night sweats, thinks he's had fever over similar time frame. He also says he injured his left foot about 3 days PTA and it's now become erythematous, warm and tender.   In ED he was initially normotensive but later febrile, hypotensive (though with somewhat variable pressures when cuff site switched arm to arm), lethargic (though had also been given low dose ativan/fentanyl in attempt to facilitate imaging  due to his anxiety/agitation, in addition to robaxin as well as home abilify, lyrica), concern for sepsis due to acute on chronic osteomyelitis of unstageable left thigh pressure wound, SSTI of LLE, and/or UTI.  He was started on vanc/cefepime and given 4250cc crystalloid, then started on low dose levophed peripherally.   Pertinent  Medical History  Anxiety Depression PTSD Paraplegia Schizophrenia Anemia  Significant Hospital Events: Including procedures, antibiotic start and stop dates in addition to other pertinent events   10/7 admitted, sepsis bolus +, low dose levo  Interim History / Subjective:   Objective   Blood pressure 108/63, pulse 82, temperature 100.2 F (37.9 C), resp. rate 20, height _0  (1.803 m), weight 72.6 kg, SpO2 99 %.        Intake/Output Summary (Last 24 hours) at 06/23/2022 1659 Last data filed at 06/23/2022 1633 Gross per 24 hour  Intake 6350 ml  Output --  Net 6350 ml   Filed Weights   06/23/22 0420  Weight: 72.6 kg    Examination: General appearance: 31  y.o., male, drowsy Eyes: PERRL, tracking appropriately HENT: NCAT; dry MM Lungs: CTAB, no crackles, no wheeze, with normal respiratory effort CV: RRR, no murmur  Abdomen: Soft, non-tender; non-distended, BS present  Extremities: No peripheral edema, warm, contractures and unstageable left thigh wound noted, left knee pressure wound, LLE warm/erythematous and a little more edematous/taut than right Neuro: drowsy but arousable with loud verbal stim, follows commands with encouragement  Bedside US with distended IVC with a little respiratory variation, normal appearing RV, LV, no PCE.     Resolved Hospital Problem list    Assessment & Plan:   # Hypotension # Severe sepsis with acute metabolic encephalopathy, oliguria Doesn't appear to still be hypovolemic but looks like he'd still be fluid tolerant. Developing septic shock vs multiple sedating medications vs less likely hemorrhagic shock given Hb drop (bedside US with full IVC, Hb could be dilutional or poor sample). Sources of sepsis potentially include acute on chronic OM, SSTI LLE, UTI. - repeat cbc, check dic panel, type/screen, lactic - vanc/merrem given relatively recent history of ESBL follow cultures and narrow as able - f/u CT A/P and LLE, CK - will reach out to [facility] to check thigh wound - surgery contacted in ED though he was resistant to imaging at that time - wean levo for map 65  # Acute toxic metabolic encephalopathy May be due to sepsis/hypotension vs multiple sedating medications vs possibility of recent methamphetamine or other substance use.  - check vbg, ammonia - gradually restart lyrica, antispasmodics - f/u response to  resuscitative measures above  # Anemia of chronic disease - trend cbc as above, possibility of acute blood loss anemia though suspect dilutional vs poor sample  # Schizophrenia # Mood disturbances - home abilify  # Moderate protein calorie malnutrition - thiamine   Best Practice (right  click and "Reselect all SmartList Selections" daily)   Diet/type: NPO w/ oral meds DVT prophylaxis: SCD GI prophylaxis: PPI Lines: N/A Foley:  Yes, and it is still needed Code Status:  full code Last date of multidisciplinary goals of care discussion [will attempt to contact pt's wife]  Labs   CBC: Recent Labs  Lab 06/23/22 0423 06/23/22 1604  WBC 9.6 5.8  NEUTROABS 6.9  --   HGB 7.9* 5.7*  HCT 25.6* 18.8*  MCV 83.4 84.7  PLT 462* 944    Basic Metabolic Panel: Recent Labs  Lab 06/23/22 0423  NA 134*  K 3.6  CL 102  CO2 24  GLUCOSE 115*  BUN 13  CREATININE 0.73  CALCIUM 8.4*   GFR: Estimated Creatinine Clearance: 136.1 mL/min (by C-G formula based on SCr of 0.73 mg/dL). Recent Labs  Lab 06/23/22 0423 06/23/22 0650 06/23/22 1604  WBC 9.6  --  5.8  LATICACIDVEN 1.9 0.9  --     Liver Function Tests: Recent Labs  Lab 06/23/22 0423  AST 13*  ALT 11  ALKPHOS 96  BILITOT 0.5  PROT 7.9  ALBUMIN 2.5*   No results for input(s): "LIPASE", "AMYLASE" in the last 168 hours. No results for input(s): "AMMONIA" in the last 168 hours.  ABG    Component Value Date/Time   PHART 7.377 08/29/2021 2237   PCO2ART 32.4 08/29/2021 2237   PO2ART 235 (H) 08/29/2021 2237   HCO3 18.0 (L) 08/29/2021 2237   TCO2 25 08/29/2021 1538   ACIDBASEDEF 5.1 (H) 08/29/2021 2237   O2SAT 69.3 09/01/2021 0512     Coagulation Profile: Recent Labs  Lab 06/23/22 0423  INR 1.2    Cardiac Enzymes: No results for input(s): "CKTOTAL", "CKMB", "CKMBINDEX", "TROPONINI" in the last 168 hours.  HbA1C: Hgb A1c MFr Bld  Date/Time Value Ref Range Status  01/05/2022 05:19 AM 4.6 (L) 4.8 - 5.6 % Final    Comment:    (NOTE) Pre diabetes:          5.7%-6.4%  Diabetes:              >6.4%  Glycemic control for   <7.0% adults with diabetes   02/02/2021 04:44 AM 5.1 4.8 - 5.6 % Final    Comment:    (NOTE) Pre diabetes:          5.7%-6.4%  Diabetes:              >6.4%  Glycemic  control for   <7.0% adults with diabetes     CBG: No results for input(s): "GLUCAP" in the last 168 hours.  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   Surgical History:   Past Surgical History:  Procedure Laterality Date   banding procedure for morbid obesity     I & D EXTREMITY Right 02/27/2014   Procedure: IRRIGATION AND DEBRIDEMENT FOREARM AND REPAIR OF 30cm LACERATION;  Surgeon: Johnny Bridge,  MD;  Location: [facility];  Service: Orthopedics;  Laterality: Right;  Anesthesia Regional with MAC   TEE WITHOUT CARDIOVERSION N/A 08/01/2021   Procedure: TRANSESOPHAGEAL ECHOCARDIOGRAM (TEE);  Surgeon: Pixie Casino, MD;  Location: [facility] ENDOSCOPY;  Service: Cardiovascular;  Laterality: N/A;     Social History:   reports that he has been smoking cigarettes. He has been smoking an average of .25 packs per day. He has never used smokeless tobacco. He reports that he does not currently use alcohol. He reports current drug use. Drugs: Marijuana and Methamphetamines.   Family History:  His family history includes Bipolar disorder in his father; Depression in his mother.   Allergies Allergies  Allergen Reactions   Caffeine Anaphylaxis   Chocolate Anaphylaxis   Tuberculin, Ppd     Other reaction(s): Eruption of skin   Sulfa Antibiotics Rash     Home Medications  Prior to Admission medications   Medication Sig Start Date End Date Taking? Authorizing Provider  acetaminophen (TYLENOL) 325 MG tablet Take 650 mg by mouth 2 (two) times daily as needed for mild pain. 03/16/22  Yes [provider]   ARIPiprazole (ABILIFY) 30 MG tablet Take 15 mg by mouth daily. 03/16/22  Yes [provider]  Calcium Carbonate 500 MG CHEW Chew 500 mg by mouth 2 (two) times daily as needed (gerd). 03/16/22  Yes [provider]  midodrine (PROAMATINE) 5 MG tablet Take 5 mg by mouth 2 (two) times daily as needed (dizziness when sitting up). 03/16/22  Yes [provider]  omeprazole (PRILOSEC) 40 MG capsule Take 40 mg by mouth daily.   Yes [provider]  pregabalin (LYRICA) 150 MG capsule Take 150 mg by mouth 2 (two) times daily.   Yes [provider]  sertraline (ZOLOFT) 50 MG tablet Take 25 mg by mouth daily. 03/16/22  Yes [provider]  tiZANidine (ZANAFLEX) 4 MG tablet Take 4 mg by mouth at bedtime as needed for muscle spasms. 03/16/22  Yes [provider]  traZODone (DESYREL) 50 MG tablet Take 50 mg by mouth at bedtime. 03/16/22  Yes [provider]  trospium (SANCTURA) 20 MG tablet Take 20 mg by mouth 2 (two) times daily.   Yes [provider]  mupirocin ointment (BACTROBAN) 2 % Place 1 Application into the nose 2 (two) times daily. Patient not taking: Reported on 06/23/2022 05/25/22   Antonieta Pert, MD  OLANZapine (ZYPREXA) 5 MG tablet Take 1 tablet (5 mg total) by mouth at bedtime. Patient not taking: Reported on 05/19/2022 01/05/22 02/04/22  Donne Hazel, MD  prazosin (MINIPRESS) 2 MG capsule Take 1 capsule (2 mg total) by mouth at bedtime. Patient not taking: Reported on 09/03/2017 11/04/16 09/14/19  Lurena Nida, NP     Critical care time: 35 minutes

## 2022-06-23 NOTE — Progress Notes (Signed)
PHARMACY - PHYSICIAN COMMUNICATION CRITICAL VALUE ALERT - BLOOD CULTURE IDENTIFICATION (BCID)  Steve [name] is an 31 y.o. male who presented to [facility] on 06/23/2022 with a chief complaint of complaints of muscle spasms of his lower extremities that radiate to his abdomen   Assessment:  1/3 Staph aureus, aerobic, MecA+  Name of physician (or Provider) Contacted: A. Zebedee Iba  Current antibiotics: vanc  Changes to prescribed antibiotics recommended:  Patient is on recommended antibiotics - No changes needed  Results for orders placed or performed during the hospital encounter of 06/23/22  Blood Culture ID Panel (Reflexed) (Collected: 06/23/2022  4:23 AM)  Result Value Ref Range   Enterococcus faecalis NOT DETECTED NOT DETECTED   Enterococcus Faecium NOT DETECTED NOT DETECTED   Listeria monocytogenes NOT DETECTED NOT DETECTED   Staphylococcus species DETECTED (A) NOT DETECTED   Staphylococcus aureus (BCID) DETECTED (A) NOT DETECTED   Staphylococcus epidermidis NOT DETECTED NOT DETECTED   Staphylococcus lugdunensis NOT DETECTED NOT DETECTED   Streptococcus species NOT DETECTED NOT DETECTED   Streptococcus agalactiae NOT DETECTED NOT DETECTED   Streptococcus pneumoniae NOT DETECTED NOT DETECTED   Streptococcus pyogenes NOT DETECTED NOT DETECTED   A.calcoaceticus-baumannii NOT DETECTED NOT DETECTED   Bacteroides fragilis NOT DETECTED NOT DETECTED   Enterobacterales NOT DETECTED NOT DETECTED   Enterobacter cloacae complex NOT DETECTED NOT DETECTED   Escherichia coli NOT DETECTED NOT DETECTED   Klebsiella aerogenes NOT DETECTED NOT DETECTED   Klebsiella oxytoca NOT DETECTED NOT DETECTED   Klebsiella pneumoniae NOT DETECTED NOT DETECTED   Proteus species NOT DETECTED NOT DETECTED   Salmonella species NOT DETECTED NOT DETECTED   Serratia marcescens NOT DETECTED NOT DETECTED   Haemophilus influenzae NOT DETECTED NOT DETECTED   Neisseria meningitidis NOT DETECTED NOT DETECTED    Pseudomonas aeruginosa NOT DETECTED NOT DETECTED   Stenotrophomonas maltophilia NOT DETECTED NOT DETECTED   Candida albicans NOT DETECTED NOT DETECTED   Candida auris NOT DETECTED NOT DETECTED   Candida glabrata NOT DETECTED NOT DETECTED   Candida krusei NOT DETECTED NOT DETECTED   Candida parapsilosis NOT DETECTED NOT DETECTED   Candida tropicalis NOT DETECTED NOT DETECTED   Cryptococcus neoformans/gattii NOT DETECTED NOT DETECTED   Meth resistant mecA/C and MREJ DETECTED (A) NOT DETECTED    Dolly Rias RPh 06/23/2022, 11:13 PM

## 2022-06-23 NOTE — ED Notes (Signed)
Pt's BP did not improve when rolling off his right side. Manual BP 70/40.  Dr. Olevia Bowens to bedside. LR taken off pump and bolus initiated w/ pressure bag.

## 2022-06-23 NOTE — Progress Notes (Addendum)
[facility] admitting physician addendum:  The nursing staff communicated to me that the patient is having decreased mentation, fever of 101.1 F and hypotension in the 70s/40s.  He is somnolent, but is answering questions.  HR, RR and oxygen saturation have remained normal.  LR 1000 mL bolus x2 and low-dose Levophed had been initiated.  His bed status was changed to ICU and I have notified Dr. Verlee Monte from [facility] that he will be going to the intensive care unit.  We will try to obtain CT abdomen/pelvis to try to further characterize the extension of his wound.  Tennis Must, MD.

## 2022-06-23 NOTE — ED Provider Notes (Signed)
[facility] DEPT Provider Note   CSN: [id number] Arrival date & time: 06/23/22  0407     History  Chief Complaint  Patient presents with   Leg Pain    Steve [name] is a 31 y.o. male.  The history is provided by the patient and the EMS personnel. The history is limited by the condition of the patient.  Leg Pain Lower extremity pain location: BLE. Time since incident:  2 weeks Injury: no   Pain details:    Quality: spasms.   Radiates to:  Groin   Severity:  Severe   Onset quality:  Gradual   Duration:  2 weeks   Timing:  Constant   Progression:  Waxing and waning Tetanus status:  Unknown Relieved by:  Nothing Worsened by:  Nothing Ineffective treatments: on Baclofen and Zanaflex at home. Associated symptoms: fever   Risk factors: no concern for non-accidental trauma   Patient is a paraplegic with wounds on B feet and buttock cheek who has has spasms.  He has also complained of sweats and intermittent fevers but has not taken his temperature as he states he does not have a thermometer.      Past Medical History:  Diagnosis Date   Anxiety    Paranoid schizophrenia ([place])    Paraplegia ([place])    PTSD (post-traumatic stress disorder) Paranoid     Home Medications Prior to Admission medications   Medication Sig Start Date End Date Taking? Authorizing Provider  acetaminophen (TYLENOL) 325 MG tablet Take 650 mg by mouth 2 (two) times daily as needed for mild pain. 03/16/22  Yes [provider]  ARIPiprazole (ABILIFY) 30 MG tablet Take 15 mg by mouth daily. 03/16/22  Yes [provider]  Calcium Carbonate 500 MG CHEW Chew 500 mg by mouth 2 (two) times daily as needed (gerd). 03/16/22  Yes [provider]  midodrine (PROAMATINE) 5 MG tablet Take 5 mg by mouth 2 (two) times daily as needed (dizziness when sitting up). 03/16/22  Yes [provider]  omeprazole (PRILOSEC) 40 MG capsule Take 40 mg by mouth daily.    Yes [provider]  pregabalin (LYRICA) 150 MG capsule Take 150 mg by mouth 2 (two) times daily.   Yes [provider]  sertraline (ZOLOFT) 50 MG tablet Take 25 mg by mouth daily. 03/16/22  Yes [provider]  tiZANidine (ZANAFLEX) 4 MG tablet Take 4 mg by mouth at bedtime as needed for muscle spasms. 03/16/22  Yes [provider]  traZODone (DESYREL) 50 MG tablet Take 50 mg by mouth at bedtime. 03/16/22  Yes [provider]  trospium (SANCTURA) 20 MG tablet Take 20 mg by mouth 2 (two) times daily.   Yes [provider]  mupirocin ointment (BACTROBAN) 2 % Place 1 Application into the nose 2 (two) times daily. Patient not taking: Reported on 06/23/2022 05/25/22   Antonieta Pert, MD  OLANZapine (ZYPREXA) 5 MG tablet Take 1 tablet (5 mg total) by mouth at bedtime. Patient not taking: Reported on 05/19/2022 01/05/22 02/04/22  Donne Hazel, MD  prazosin (MINIPRESS) 2 MG capsule Take 1 capsule (2 mg total) by mouth at bedtime. Patient not taking: Reported on 09/03/2017 11/04/16 09/14/19  Lurena Nida, NP      Allergies    Caffeine; Chocolate; Tuberculin, ppd; and Sulfa antibiotics    Review of Systems   Review of Systems  Unable to perform ROS: Acuity of condition  Constitutional:  Positive for fever.  HENT:  Negative for facial swelling.   Respiratory:  Negative for wheezing and stridor.   Musculoskeletal:  Positive for arthralgias.  Skin:  Positive for wound.    Physical Exam Updated Vital Signs BP 129/87   Pulse 91   Ht 5\' 11"  (1.803 m)   Wt 72.6 kg   SpO2 98%   BMI 22.32 kg/m  Physical Exam Vitals and nursing note reviewed. Exam conducted with a chaperone present.  Constitutional:      General: He is not in acute distress.    Appearance: He is well-developed. He is not diaphoretic.  HENT:     Head: Normocephalic and atraumatic.     Nose: Nose normal.  Eyes:     Conjunctiva/sclera: Conjunctivae normal.     Pupils: Pupils are  equal, round, and reactive to light.  Cardiovascular:     Rate and Rhythm: Regular rhythm. Tachycardia present.     Pulses: Normal pulses.     Heart sounds: Normal heart sounds.  Pulmonary:     Effort: Pulmonary effort is normal.     Breath sounds: Normal breath sounds. No wheezing or rales.  Abdominal:     General: Bowel sounds are normal.     Palpations: Abdomen is soft.     Tenderness: There is no abdominal tenderness. There is no guarding or rebound.  Musculoskeletal:     Cervical back: Normal range of motion and neck supple.     Right lower leg: No edema.     Left lower leg: No edema.       Feet:  Skin:    General: Skin is warm and dry.     Capillary Refill: Capillary refill takes less than 2 seconds.       Neurological:     Mental Status: He is alert and oriented to person, place, and time.  Psychiatric:        Mood and Affect: Mood normal.     ED Results / Procedures / Treatments   Labs (all labs ordered are listed, but only abnormal results are displayed) Results for orders placed or performed during the hospital encounter of 06/23/22  Resp Panel by RT-PCR (Flu A&B, Covid) Anterior Nasal Swab   Specimen: Anterior Nasal Swab  Result Value Ref Range   SARS Coronavirus 2 by RT PCR NEGATIVE NEGATIVE   Influenza A by PCR NEGATIVE NEGATIVE   Influenza B by PCR NEGATIVE NEGATIVE  Lactic acid, plasma  Result Value Ref Range   Lactic Acid, Venous 1.9 0.5 - 1.9 mmol/L  Comprehensive metabolic panel  Result Value Ref Range   Sodium 134 (L) 135 - 145 mmol/L   Potassium 3.6 3.5 - 5.1 mmol/L   Chloride 102 98 - 111 mmol/L   CO2 24 22 - 32 mmol/L   Glucose, Bld 115 (H) 70 - 99 mg/dL   BUN 13 6 - 20 mg/dL   Creatinine, Ser 0.73 0.61 - 1.24 mg/dL   Calcium 8.4 (L) 8.9 - 10.3 mg/dL   Total Protein 7.9 6.5 - 8.1 g/dL   Albumin 2.5 (L) 3.5 - 5.0 g/dL   AST 13 (L) 15 - 41 U/L   ALT 11 0 - 44 U/L   Alkaline Phosphatase 96 38 - 126 U/L   Total Bilirubin 0.5 0.3 - 1.2 mg/dL    GFR, Estimated >60 >60 mL/min   Anion gap 8 5 - 15  CBC with Differential  Result Value Ref Range   WBC 9.6 4.0 - 10.5 K/uL  RBC 3.07 (L) 4.22 - 5.81 MIL/uL   Hemoglobin 7.9 (L) 13.0 - 17.0 g/dL   HCT 25.6 (L) 39.0 - 52.0 %   MCV 83.4 80.0 - 100.0 fL   MCH 25.7 (L) 26.0 - 34.0 pg   MCHC 30.9 30.0 - 36.0 g/dL   RDW 16.8 (H) 11.5 - 15.5 %   Platelets 462 (H) 150 - 400 K/uL   nRBC 0.0 0.0 - 0.2 %   Neutrophils Relative % 72 %   Neutro Abs 6.9 1.7 - 7.7 K/uL   Lymphocytes Relative 18 %   Lymphs Abs 1.7 0.7 - 4.0 K/uL   Monocytes Relative 7 %   Monocytes Absolute 0.7 0.1 - 1.0 K/uL   Eosinophils Relative 2 %   Eosinophils Absolute 0.2 0.0 - 0.5 K/uL   Basophils Relative 0 %   Basophils Absolute 0.0 0.0 - 0.1 K/uL   Immature Granulocytes 1 %   Abs Immature Granulocytes 0.06 0.00 - 0.07 K/uL  Protime-INR  Result Value Ref Range   Prothrombin Time 15.2 11.4 - 15.2 seconds   INR 1.2 0.8 - 1.2  APTT  Result Value Ref Range   aPTT 39 (H) 24 - 36 seconds  Urinalysis, Routine w reflex microscopic  Result Value Ref Range   Color, Urine AMBER (A) YELLOW   APPearance HAZY (A) CLEAR   Specific Gravity, Urine 1.028 1.005 - 1.030   pH 5.0 5.0 - 8.0   Glucose, UA NEGATIVE NEGATIVE mg/dL   Hgb urine dipstick SMALL (A) NEGATIVE   Bilirubin Urine NEGATIVE NEGATIVE   Ketones, ur NEGATIVE NEGATIVE mg/dL   Protein, ur 100 (A) NEGATIVE mg/dL   Nitrite POSITIVE (A) NEGATIVE   Leukocytes,Ua SMALL (A) NEGATIVE   RBC / HPF 21-50 0 - 5 RBC/hpf   WBC, UA >50 (H) 0 - 5 WBC/hpf   Bacteria, UA MANY (A) NONE SEEN   Squamous Epithelial / LPF 0-5 0 - 5   Mucus PRESENT    Hyaline Casts, UA PRESENT   Rapid urine drug screen (hospital performed)  Result Value Ref Range   Opiates NONE DETECTED NONE DETECTED   Cocaine NONE DETECTED NONE DETECTED   Benzodiazepines NONE DETECTED NONE DETECTED   Amphetamines POSITIVE (A) NONE DETECTED   Tetrahydrocannabinol NONE DETECTED NONE DETECTED   Barbiturates  NONE DETECTED NONE DETECTED   DG Foot Complete Left  Result Date: 06/23/2022 CLINICAL DATA:  31 year old male with possible sepsis. Open sores and swelling of left foot. EXAM: LEFT FOOT - COMPLETE 3+ VIEW COMPARISON:  Left ankle series 07/27/2021. FINDINGS: Moderate to severe soft tissue swelling in the left foot especially at the dorsum of the foot and more pronounced distally. No definite soft tissue gas. No radiopaque foreign body identified. Underlying bone mineralization remains within normal limits. No fracture, dislocation, or cortical osteolysis identified. Maintained joint spaces. IMPRESSION: Moderate to severe soft tissue swelling. No tracking soft tissue gas or plain radiographic evidence of osteomyelitis. Electronically Signed   By: Genevie Ann M.D.   On: 06/23/2022 06:03   DG Chest Port 1 View  Result Date: 06/23/2022 CLINICAL DATA:  31 year old male with possible sepsis. EXAM: PORTABLE CHEST 1 VIEW COMPARISON:  Chest radiographs 05/18/2022 and earlier. FINDINGS: Portable AP semi upright views at 0539 hours. Lower lung volumes and kyphotic positioning. Allowing for portable technique the lungs are clear. Normal cardiac size and mediastinal contours. Visualized tracheal air column is within normal limits. Visible bowel gas pattern is within normal limits. Faintly visible anterior and posterior  cervical spine fusion hardware better demonstrated on prior exams. No other osseous abnormality identified. IMPRESSION: Negative portable chest. Electronically Signed   By: Genevie Ann M.D.   On: 06/23/2022 06:00     EKG  EKG Interpretation  Date/Time:  Saturday June 23 2022 05:22:16 EDT Ventricular Rate:  96 PR Interval:  119 QRS Duration: 82 QT Interval:  351 QTC Calculation: 444 R Axis:   77 Text Interpretation: Sinus rhythm Borderline short PR interval Confirmed by Dory Horn) on 06/23/2022 6:52:01 AM         Radiology DG Foot Complete Left  Result Date: 06/23/2022 CLINICAL  DATA:  31 year old male with possible sepsis. Open sores and swelling of left foot. EXAM: LEFT FOOT - COMPLETE 3+ VIEW COMPARISON:  Left ankle series 07/27/2021. FINDINGS: Moderate to severe soft tissue swelling in the left foot especially at the dorsum of the foot and more pronounced distally. No definite soft tissue gas. No radiopaque foreign body identified. Underlying bone mineralization remains within normal limits. No fracture, dislocation, or cortical osteolysis identified. Maintained joint spaces. IMPRESSION: Moderate to severe soft tissue swelling. No tracking soft tissue gas or plain radiographic evidence of osteomyelitis. Electronically Signed   By: Genevie Ann M.D.   On: 06/23/2022 06:03   DG Chest Port 1 View  Result Date: 06/23/2022 CLINICAL DATA:  31 year old male with possible sepsis. EXAM: PORTABLE CHEST 1 VIEW COMPARISON:  Chest radiographs 05/18/2022 and earlier. FINDINGS: Portable AP semi upright views at 0539 hours. Lower lung volumes and kyphotic positioning. Allowing for portable technique the lungs are clear. Normal cardiac size and mediastinal contours. Visualized tracheal air column is within normal limits. Visible bowel gas pattern is within normal limits. Faintly visible anterior and posterior cervical spine fusion hardware better demonstrated on prior exams. No other osseous abnormality identified. IMPRESSION: Negative portable chest. Electronically Signed   By: Genevie Ann M.D.   On: 06/23/2022 06:00    Procedures Procedures    Medications Ordered in ED Medications  lactated ringers infusion (has no administration in time range)  ceFEPIme (MAXIPIME) 2 g in sodium chloride 0.9 % 100 mL IVPB (has no administration in time range)  metroNIDAZOLE (FLAGYL) IVPB 500 mg (has no administration in time range)  vancomycin (VANCOCIN) IVPB 1000 mg/200 mL premix (has no administration in time range)  lactated ringers bolus 1,000 mL (has no administration in time range)    And  lactated  ringers bolus 1,000 mL (has no administration in time range)    And  lactated ringers bolus 250 mL (has no administration in time range)  Tdap (BOOSTRIX) injection 0.5 mL (0.5 mLs Intramuscular Given 06/23/22 0438)    ED Course/ Medical Decision Making/ A&P                           Medical Decision Making EMS called for B leg spasms.    Problems Addressed: Acute cystitis without hematuria:    Details: Sepsis bundle initiated and patient is admitted  Cellulitis of left lower extremity:    Details: Sepsis bundle initiated and patient is admitted  Infected decubitus ulcer, stage IV [facility]):    Details: Sepsis bundle initiated surgery consulted and patient is admitted   Amount and/or Complexity of Data Reviewed Independent Historian: EMS    Details: See above  Labs: ordered.    Details: All labs reviewed:  UDS positive for amphetamines.  Negative covid and flu.  Lactate is negative 1.9. Normal coags INR  1.2 .  Sodium slightly low 134, normal potassium 3.6 normal creatinine .73.  Normal LFTs.  Urine is positive for UTI.  Normal white count 9.6, low hemoglobin 7.9, platelet count elevated 462K Radiology: ordered and independent interpretation performed.    Details: CXR negative by me no air in soft tissues by me.   ECG/medicine tests: ordered and independent interpretation performed. Decision-making details documented in ED Course. Discussion of management or test interpretation with external provider(s): Dr. Claria Dice will admit  Case d/w Dr. Catalina Antigua of [facility], surgery to see patient in consult   Risk Prescription drug management. Decision regarding hospitalization.  Critical Care Total time providing critical care: 60 minutes (Sepsis bundle, complexity of care, multiple consults )   The patient appears reasonably stabilized for admission considering the current resources, flow, and capabilities available in the ED at this time, and I doubt any other [facility] requiring further screening and/or  treatment in the ED prior to admission.  Final Clinical Impression(s) / ED Diagnoses Final diagnoses:  SIRS (systemic inflammatory response syndrome) [facility])    Rx / DC Orders ED Discharge Orders     None         Mardell Suttles, MD 06/23/22 [id number]

## 2022-06-23 NOTE — Progress Notes (Signed)
An USGPIV (ultrasound guided PIV) has been placed for short-term vasopressor infusion. A correctly placed ivWatch must be used when administering Vasopressors. Should this treatment be needed beyond 72 hours, central line access should be obtained.  It will be the responsibility of the bedside nurse to follow best practice to prevent extravasations.   ?

## 2022-06-23 NOTE — ED Notes (Signed)
Pt unable to tolerate CT scan as he was afraid he would have a spasm and fall. This Probation officer offered to place shielding on and stay with pt to ensure he did not fall. Pt adamant that he did not want to continue with CT scan.

## 2022-06-23 NOTE — H&P (Signed)
History and Physical    Patient: Steve [name] OFH:[id number] DOB: 1991/09/11 DOA: 06/23/2022 DOS: the patient was seen and examined on 06/23/2022 PCP: [facility] [place]  Patient coming from: Home  Chief Complaint:  Chief Complaint  Patient presents with   Leg Pain   HPI: Steve [name] is a 31 y.o. male with medical history significant of anxiety, depression, paranoid schizophrenia, paraplegia, PTSD who presented to the emergency department with complaints of muscle spasms of his lower extremities that radiate to his abdomen.  He has also been having chills, night sweats and thinks he has been getting fevers although he has no thermometer to check.  He injured his left foot about 3 days ago and it has become erythematosus, erythematosus with calor and TTP.  He also stated his chronic left thigh wound has been having more drainage than usual.  His appetite is mildly decreased, but no nausea or vomiting. He denied rhinorrhea, sore throat, wheezing or hemoptysis.  No chest pain, palpitations, diaphoresis, PND, orthopnea or pitting edema of the lower extremities.  No diarrhea, constipation, melena or hematochezia. No polyuria, polydipsia, polyphagia or blurred vision.   ED course: Initial vital signs were temperature 99.8 F, pulse 91, respiration 20, BP 129/87 mmHg and O2 sat 98% on room air.  The patient received 2250 mL of LR bolus, ketorolac 15 mg IVP, Tdap 0.5 mL IM x1, methocarbamol 1000 mg IVPB cefepime, metronidazole and vancomycin.  Lab work: His urinalysis shows small hemoglobinuria, positive nitrates and small leukocyte esterase.  There were more than 50 WBC BCs, 21-50 RBC and many bacteria on microscopic examination.  UDS was positive for amphetamines.  Influenza and coronavirus PCR was negative.  CBC showed a white count of 9.6, hemoglobin 7.9 g/dL platelets 462.  PT 15.2, INR 1.2 and PTT 39.  Lactic acid was normal.  CMP showed a sodium of 134 mmol/L, the rest of the normal  electrolytes and renal function after calcium correction.  Glucose 115 mg/dL and albumin 2.5 g/dL.  Rest of the LFTs were unremarkable.  Imaging: Left foot x-ray showed moderate to severe soft tissue swelling without tracking soft tissue gas or plain radiographic evidence of osteomyelitis.  Portable 1 view chest radiograph with no active cardiopulmonary disease.  Review of Systems: As mentioned in the history of present illness. All other systems reviewed and are negative. Past Medical History:  Diagnosis Date   Anxiety    Paranoid schizophrenia ([place])    Paraplegia ([place])    PTSD (post-traumatic stress disorder) Paranoid   Past Surgical History:  Procedure Laterality Date   banding procedure for morbid obesity     I & D EXTREMITY Right 02/27/2014   Procedure: IRRIGATION AND DEBRIDEMENT FOREARM AND REPAIR OF 30cm LACERATION;  Surgeon: Johnny Bridge, MD;  Location: [facility];  Service: Orthopedics;  Laterality: Right;  Anesthesia [facility] with MAC   TEE WITHOUT CARDIOVERSION N/A 08/01/2021   Procedure: TRANSESOPHAGEAL ECHOCARDIOGRAM (TEE);  Surgeon: Pixie Casino, MD;  Location: [facility];  Service: Cardiovascular;  Laterality: N/A;   Social History:  reports that he has been smoking cigarettes. He has been smoking an average of .25 packs per day. He has never used smokeless tobacco. He reports that he does not currently use alcohol. He reports current drug use. Drugs: Marijuana and Methamphetamines.  Allergies  Allergen Reactions   Caffeine Anaphylaxis   Chocolate Anaphylaxis   Tuberculin, Ppd     Other reaction(s): Eruption of skin   Sulfa Antibiotics Rash  Family History  Problem Relation Age of Onset   Depression Mother    Bipolar disorder Father     Prior to Admission medications   Medication Sig Start Date End Date Taking? Authorizing Provider  acetaminophen (TYLENOL) 325 MG tablet Take 650 mg by mouth 2 (two) times daily as needed for mild pain. 03/16/22  Yes [provider]  ARIPiprazole (ABILIFY) 30 MG tablet Take 15 mg by mouth daily. 03/16/22  Yes [provider]  Calcium Carbonate 500 MG CHEW Chew 500 mg by mouth 2 (two) times daily as needed (gerd). 03/16/22  Yes [provider]  midodrine (PROAMATINE) 5 MG tablet Take 5 mg by mouth 2 (two) times daily as needed (dizziness when sitting up). 03/16/22  Yes [provider]  omeprazole (PRILOSEC) 40 MG capsule Take 40 mg by mouth daily.   Yes [provider]  pregabalin (LYRICA) 150 MG capsule Take 150 mg by mouth 2 (two) times daily.   Yes [provider]  sertraline (ZOLOFT) 50 MG tablet Take 25 mg by mouth daily. 03/16/22  Yes [provider]  tiZANidine (ZANAFLEX) 4 MG tablet Take 4 mg by mouth at bedtime as needed for muscle spasms. 03/16/22  Yes [provider]  traZODone (DESYREL) 50 MG tablet Take 50 mg by mouth at bedtime. 03/16/22  Yes [provider]  trospium (SANCTURA) 20 MG tablet Take 20 mg by mouth 2 (two) times daily.   Yes [provider]  mupirocin ointment (BACTROBAN) 2 % Place 1 Application into the nose 2 (two) times daily. Patient not taking: Reported on 06/23/2022 05/25/22   Antonieta Pert, MD  OLANZapine (ZYPREXA) 5 MG tablet Take 1 tablet (5 mg total) by mouth at bedtime. Patient not taking: Reported on 05/19/2022 01/05/22 02/04/22  Donne Hazel, MD  prazosin (MINIPRESS) 2 MG capsule Take 1 capsule (2 mg total) by mouth at bedtime. Patient not taking: Reported on 09/03/2017 11/04/16 09/14/19  Lurena Nida, NP    Physical Exam: Vitals:   06/23/22 0419 06/23/22 0420 06/23/22 0615 06/23/22 0715  BP: 129/87  133/71 (!) 140/79  Pulse: 91  75 88  Resp:   20 17  Temp:   99.8 F (37.7 C) 98.6 F (37 C)  SpO2: 98%  92% 97%  Weight:  72.6 kg    Height:  _0  (1.803 m)     Physical Exam Vitals and nursing note reviewed.  HENT:     Head: Normocephalic.     Nose: No rhinorrhea.     Mouth/Throat:      Mouth: Mucous membranes are moist.     Pharynx: Oropharyngeal exudate present.  Eyes:     General: No scleral icterus.    Pupils: Pupils are equal, round, and reactive to light.  Neck:     Vascular: No JVD.  Cardiovascular:     Rate and Rhythm: Normal rate and regular rhythm.     Heart sounds: S1 normal and S2 normal.  Pulmonary:     Effort: Pulmonary effort is normal.     Breath sounds: Normal breath sounds.  Abdominal:     General: Bowel sounds are normal.     Palpations: Abdomen is soft.     Tenderness: There is no abdominal tenderness.  Musculoskeletal:     Cervical back: Neck supple.     Comments: Bilateral lower extremity atrophy.  Skin:    General: Skin is warm and dry.     Comments: Left thigh pressure ulcer  that is unstageable.  Left foot wounds with edema, erythema, TTP and calor.  Right foot healing wound just anteroinferiorly to the external malleolar area.  Please see pictures below.   Neurological:     Mental Status: He is alert and oriented to person, place, and time. Mental status is at baseline.  Psychiatric:        Mood and Affect: Mood is depressed.        Behavior: Behavior normal.            Data Reviewed:  Results are pending, will review when available.  Assessment and Plan: Principal Problem:   SIRS (systemic inflammatory response syndrome) (HCC) In the setting of:   Unstageable pressure ulcer of left hip (HCC) Without associated:   Chronic multifocal osteomyelitis, left femur (HCC) Admit to PCU/inpatient. Continue IV fluids. Continue cefepime 2 g every 8 hours.   Continue metronidazole 500 mg IVPB q 12 hr. Continue vancomycin per pharmacy. Follow-up blood culture and sensitivity Follow CBC and CMP in a.m. Analgesics as needed.  Antiemetics as needed. Consult wound care. Agreed to obtain CT once medicated for pain/muscle spasms. Depending on results will notify general surgery.  Active Problems:   Cellulitis of left foot Continue  antibiotic coverage as above.    Hyponatremia Mild. Unknown significance. Monitor sodium level.    Normocytic anemia Monitor monitoring hemoglobin.  Transfuse as needed.    Anxiety and depression   Chronic post-traumatic stress disorder (PTSD)   History of schizophrenia Depressed.  No SI or HI. Has not taken meds since last week. Resume aripiprazole 50 mg p.o. daily. Resume sertraline 25 mg p.o. daily. Resume trazodone 50 mg as needed at bedtime. Consider behavioral health evaluation if no improvement.    Malnutrition of moderate degree Protein supplementation. Consider dietitian evaluation.    Amphetamine abuse ([place]) Will refer to [facility] evaluation. Consider behavioral health evaluation.    Gastroesophageal reflux disease without esophagitis Resume PPI.    Tobacco use disorder Declined nicotine replacement therapy. Tobacco cessation advised.    Thrombocytosis In the setting of chronic anemia. Monitor platelet count.      Advance Care Planning:   Code Status: Full code.  Consults: [place] surgery Donnie Mesa MD).  Family Communication:  Severity of Illness: The appropriate patient status for this patient is OBSERVATION. Observation status is judged to be reasonable and necessary in order to provide the required intensity of service to ensure the patient's safety. The patient's presenting symptoms, physical exam findings, and initial radiographic and laboratory data in the context of their medical condition is felt to place them at decreased risk for further clinical deterioration. Furthermore, it is anticipated that the patient will be medically stable for discharge from the hospital within 2 midnights of admission.   Author: Reubin Milan, MD 06/23/2022 7:19 AM  For on call review [email or web address].   This document was prepared using Dragon voice recognition software and may contain some unintended transcription errors.

## 2022-06-23 NOTE — ED Triage Notes (Signed)
Pt BIB EMS from home for leg spasms for 2 weeks. Pt states "the pain radiates from his legs to lower abdomen. Pt started feeling nauseous with EMS.  118/70 90 HR 96% RA

## 2022-06-23 NOTE — ED Notes (Signed)
Pt's BP drops to 80's/40's when pt is lying on his right side however when pt turns to left side BP immediately improves to 120's/60's. Dr. Olevia Bowens is aware. Pt is asymptomatic w/ hypotension.

## 2022-06-23 NOTE — Progress Notes (Signed)
eLink Physician-Brief Progress Note Patient Name: Steve [name] DOB: 10-02-1990 MRN: [id number]   Date of Service  06/23/2022  HPI/Events of Note  Patient admitted from ED with Foley catheter in place. No order for Foley catheter.   eICU Interventions  Plan: Continue Foley catheter.      Intervention Category Major Interventions: Other:  [name] 06/23/2022, 10:32 PM

## 2022-06-23 NOTE — Progress Notes (Addendum)
Pharmacy Antibiotic Note  Steve [name] is a 31 y.o. male admitted on 06/23/2022 with cellulitis + positive UA.  Pharmacy has been consulted for vancomycin dosing.  Plan: Vancomycin 1 gm given in ED @ [phone] am Vancomycin 1250 mg IV q12 for est AUC 487.8, SCr rounded up to 0.8, Vd 0.72, Css min 12.2 Cefepime 2 gm q8 per MD F/u renal function, WBC, temp, culture data  Height: 5\' 11"  (180.3 cm) Weight: 72.6 kg (160 lb) IBW/kg (Calculated) : 75.3  Temp (24hrs), Avg:98.4 F (36.9 C), Min:98.1 F (36.7 C), Max:99.8 F (37.7 C)  Recent Labs  Lab 06/23/22 0423 06/23/22 0650  WBC 9.6  --   CREATININE 0.73  --   LATICACIDVEN 1.9 0.9    Estimated Creatinine Clearance: 136.1 mL/min (by C-G formula based on SCr of 0.73 mg/dL).    Allergies  Allergen Reactions   Caffeine Anaphylaxis   Chocolate Anaphylaxis   Tuberculin, Ppd     Other reaction(s): Eruption of skin   Sulfa Antibiotics Rash    Antimicrobials this admission: 10/7 cefepime>  10/7 vancomycin>> 10/7 flagyl x 1  Dose adjustments this admission:   Microbiology results: 10/7 BCx2: sent 10/7 UCx: sent  Thank you for allowing pharmacy to be a part of this patient's care.  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

## 2022-06-24 ENCOUNTER — Inpatient Hospital Stay (HOSPITAL_COMMUNITY): Payer: No Typology Code available for payment source

## 2022-06-24 DIAGNOSIS — N3000 Acute cystitis without hematuria: Secondary | ICD-10-CM | POA: Diagnosis not present

## 2022-06-24 DIAGNOSIS — B9561 Methicillin susceptible Staphylococcus aureus infection as the cause of diseases classified elsewhere: Secondary | ICD-10-CM

## 2022-06-24 DIAGNOSIS — R7881 Bacteremia: Secondary | ICD-10-CM | POA: Diagnosis not present

## 2022-06-24 DIAGNOSIS — R6510 Systemic inflammatory response syndrome (SIRS) of non-infectious origin without acute organ dysfunction: Secondary | ICD-10-CM | POA: Diagnosis not present

## 2022-06-24 DIAGNOSIS — F1510 Other stimulant abuse, uncomplicated: Secondary | ICD-10-CM

## 2022-06-24 DIAGNOSIS — F4312 Post-traumatic stress disorder, chronic: Secondary | ICD-10-CM | POA: Diagnosis not present

## 2022-06-24 DIAGNOSIS — F15251 Other stimulant dependence with stimulant-induced psychotic disorder with hallucinations: Secondary | ICD-10-CM | POA: Diagnosis not present

## 2022-06-24 DIAGNOSIS — G8250 Quadriplegia, unspecified: Secondary | ICD-10-CM

## 2022-06-24 DIAGNOSIS — B9562 Methicillin resistant Staphylococcus aureus infection as the cause of diseases classified elsewhere: Secondary | ICD-10-CM

## 2022-06-24 DIAGNOSIS — F418 Other specified anxiety disorders: Secondary | ICD-10-CM | POA: Diagnosis not present

## 2022-06-24 DIAGNOSIS — L0390 Cellulitis, unspecified: Secondary | ICD-10-CM

## 2022-06-24 LAB — COMPREHENSIVE METABOLIC PANEL
ALT: 8 U/L (ref 0–44)
ALT: 8 U/L (ref 0–44)
AST: 6 U/L — ABNORMAL LOW (ref 15–41)
AST: 8 U/L — ABNORMAL LOW (ref 15–41)
Albumin: 2 g/dL — ABNORMAL LOW (ref 3.5–5.0)
Albumin: 1.8 g/dL — ABNORMAL LOW (ref 3.5–5.0)
Alkaline Phosphatase: 76 U/L (ref 38–126)
Alkaline Phosphatase: 67 U/L (ref 38–126)
Anion gap: 5 (ref 5–15)
Anion gap: 5 (ref 5–15)
BUN: 8 mg/dL (ref 6–20)
BUN: 8 mg/dL (ref 6–20)
CO2: 24 mmol/L (ref 22–32)
CO2: 24 mmol/L (ref 22–32)
Calcium: 7.8 mg/dL — ABNORMAL LOW (ref 8.9–10.3)
Calcium: 7.3 mg/dL — ABNORMAL LOW (ref 8.9–10.3)
Chloride: 107 mmol/L (ref 98–111)
Chloride: 106 mmol/L (ref 98–111)
Creatinine, Ser: 0.47 mg/dL — ABNORMAL LOW (ref 0.61–1.24)
Creatinine, Ser: 0.59 mg/dL — ABNORMAL LOW (ref 0.61–1.24)
GFR, Estimated: >60 mL/min (ref >60)
GFR, Estimated: >60 mL/min (ref >60)
Glucose, Bld: 88 mg/dL (ref 70–99)
Glucose, Bld: 101 mg/dL — ABNORMAL HIGH (ref 70–99)
Potassium: 3.3 mmol/L — ABNORMAL LOW (ref 3.5–5.1)
Potassium: 3.4 mmol/L — ABNORMAL LOW (ref 3.5–5.1)
Sodium: 136 mmol/L (ref 135–145)
Sodium: 135 mmol/L (ref 135–145)
Total Bilirubin: 0.9 mg/dL (ref 0.3–1.2)
Total Bilirubin: 0.4 mg/dL (ref 0.3–1.2)
Total Protein: 6.4 g/dL — ABNORMAL LOW (ref 6.5–8.1)
Total Protein: 5.7 g/dL — ABNORMAL LOW (ref 6.5–8.1)

## 2022-06-24 LAB — CBC WITH DIFFERENTIAL/PLATELET
Abs Immature Granulocytes: 0.02 10*3/uL (ref 0.00–0.07)
Basophils Absolute: 0 10*3/uL (ref 0.0–0.1)
Basophils Relative: 1 %
Eosinophils Absolute: 0.2 10*3/uL (ref 0.0–0.5)
Eosinophils Relative: 6 %
HCT: 26.1 % — ABNORMAL LOW (ref 39.0–52.0)
Hemoglobin: 7.9 g/dL — ABNORMAL LOW (ref 13.0–17.0)
Immature Granulocytes: 1 %
Lymphocytes Relative: 26 %
Lymphs Abs: 1 10*3/uL (ref 0.7–4.0)
MCH: 26.1 pg (ref 26.0–34.0)
MCHC: 30.3 g/dL (ref 30.0–36.0)
MCV: 86.1 fL (ref 80.0–100.0)
Monocytes Absolute: 0.4 10*3/uL (ref 0.1–1.0)
Monocytes Relative: 10 %
Neutro Abs: 2.2 10*3/uL (ref 1.7–7.7)
Neutrophils Relative %: 56 %
Platelets: 284 10*3/uL (ref 150–400)
RBC: 3.03 MIL/uL — ABNORMAL LOW (ref 4.22–5.81)
RDW: 16.3 % — ABNORMAL HIGH (ref 11.5–15.5)
WBC: 3.9 10*3/uL — ABNORMAL LOW (ref 4.0–10.5)
nRBC: 0 % (ref 0.0–0.2)

## 2022-06-24 LAB — CBC
HCT: 22.5 % — ABNORMAL LOW (ref 39.0–52.0)
Hemoglobin: 6.7 g/dL — CL (ref 13.0–17.0)
MCH: 25.7 pg — ABNORMAL LOW (ref 26.0–34.0)
MCHC: 29.8 g/dL — ABNORMAL LOW (ref 30.0–36.0)
MCV: 86.2 fL (ref 80.0–100.0)
Platelets: 287 10*3/uL (ref 150–400)
RBC: 2.61 MIL/uL — ABNORMAL LOW (ref 4.22–5.81)
RDW: 16.5 % — ABNORMAL HIGH (ref 11.5–15.5)
WBC: 5.1 10*3/uL (ref 4.0–10.5)
nRBC: 0 % (ref 0.0–0.2)

## 2022-06-24 LAB — HEMOGLOBIN AND HEMATOCRIT, BLOOD
HCT: 24.5 % — ABNORMAL LOW (ref 39.0–52.0)
Hemoglobin: 7.7 g/dL — ABNORMAL LOW (ref 13.0–17.0)

## 2022-06-24 LAB — HIV ANTIBODY (ROUTINE TESTING W REFLEX): HIV Screen 4th Generation wRfx: NONREACTIVE

## 2022-06-24 LAB — GLUCOSE, CAPILLARY: Glucose-Capillary: 103 mg/dL — ABNORMAL HIGH (ref 70–99)

## 2022-06-24 LAB — PREPARE RBC (CROSSMATCH)

## 2022-06-24 MED ORDER — OXYCODONE HCL 5 MG/5ML PO SOLN
5.0000 mg | Freq: Four times a day (QID) | ORAL | Status: DC | PRN
  Administered 2022-06-24 – 2022-07-01 (×18): 5 mg via ORAL
  Filled 2022-06-24 (×19): qty 5

## 2022-06-24 MED ORDER — IOHEXOL 300 MG/ML  SOLN
100.0000 mL | Freq: Once | INTRAMUSCULAR | Status: AC | PRN
  Administered 2022-06-24: 100 mL via INTRAVENOUS

## 2022-06-24 MED ORDER — SODIUM CHLORIDE 0.9% IV SOLUTION: Freq: Once | INTRAVENOUS | Status: AC

## 2022-06-24 MED ORDER — VANCOMYCIN HCL 1500 MG/300ML IV SOLN
1500.0000 mg | Freq: Two times a day (BID) | INTRAVENOUS | Status: DC
  Administered 2022-06-24 – 2022-06-27 (×6): 1500 mg via INTRAVENOUS
  Filled 2022-06-24 (×6): qty 300

## 2022-06-24 MED ORDER — THIAMINE HCL 100 MG/ML IJ SOLN
100.0000 mg | Freq: Every day | INTRAMUSCULAR | Status: DC
  Administered 2022-06-24 – 2022-06-27 (×4): 100 mg via INTRAVENOUS
  Filled 2022-06-24 (×4): qty 2

## 2022-06-24 MED ORDER — POTASSIUM CHLORIDE CRYS ER 20 MEQ PO TBCR
20.0000 meq | EXTENDED_RELEASE_TABLET | Freq: Two times a day (BID) | ORAL | Status: DC
  Administered 2022-06-24 (×2): 20 meq via ORAL
  Filled 2022-06-24 (×2): qty 1

## 2022-06-24 MED ORDER — ALBUMIN HUMAN 5 % IV SOLN
25.0000 g | Freq: Once | INTRAVENOUS | Status: AC
  Administered 2022-06-24: 25 g via INTRAVENOUS
  Filled 2022-06-24: qty 500

## 2022-06-24 MED ORDER — SODIUM CHLORIDE (PF) 0.9 % IJ SOLN
INTRAMUSCULAR | Status: AC
  Filled 2022-06-24: qty 50

## 2022-06-24 MED ORDER — FENTANYL CITRATE PF 50 MCG/ML IJ SOSY
50.0000 ug | PREFILLED_SYRINGE | INTRAMUSCULAR | Status: DC | PRN
  Administered 2022-06-24 – 2022-06-28 (×13): 50 ug via INTRAVENOUS
  Filled 2022-06-24 (×14): qty 1

## 2022-06-24 MED ORDER — LACTATED RINGERS IV SOLN: INTRAVENOUS | Status: DC

## 2022-06-24 NOTE — Assessment & Plan Note (Signed)
Long-term TOC referral

## 2022-06-24 NOTE — Assessment & Plan Note (Signed)
Likely source of infection, see osteomyelitis assessment and plan.

## 2022-06-24 NOTE — Progress Notes (Signed)
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  Surgical team will assess with WOCN for possible need for operative debridement.        Armandina Gemma, MD [facility] [place] [facility] Office: [phone]        Chief Complaint: Sepsis, decubitus ulcers, osteomyelitis  Subjective: Patient in bed in stepdown/ICU.  Responsive.  Objective: Vital signs in last 24 hours: Temp:  [96.8 F (36 C)-101.1 F (38.4 C)] 99.7 F (37.6 C) (10/08 0730) Pulse Rate:  [54-99] 78 (10/08 0730) Resp:  [9-30] 18 (10/08 0730) BP: (47-145)/(23-92) 121/53 (10/08 0730) SpO2:  [78 %-100 %] 97 % (10/08 0730) Last BM Date :  (PTA)  Intake/Output from previous day: 10/07 0701 - 10/08 0700 In: 9549.8 [P.O.:880; I.V.:2762.9; Blood:581; IV Piggyback:5325.9] Out: 1150 [Urine:1150] Intake/Output this shift: Total I/O In: 367.8 [I.V.:136.3; IV Piggyback:231.6] Out: -   Physical Exam: HEENT - sclerae clear, mucous membranes moist Wound exam deferred  Lab Results:  Recent Labs    06/23/22 1728 06/23/22 1736 06/24/22 0044  WBC 6.7  --  5.1  HGB 6.3*  --  6.7*  HCT 21.2*  --  22.5*  PLT 352 311 287   BMET Recent Labs    06/23/22 1604 06/24/22 0044  NA 134* 136  K 3.5 3.3*  CL 105 107  CO2 23 24  GLUCOSE 98 88  BUN 11 8  CREATININE 0.67 0.47*  CALCIUM 7.8* 7.8*   PT/INR Recent Labs    06/23/22 0423 06/23/22 1736  LABPROT 15.2 15.8*  INR 1.2 1.3*   Comprehensive Metabolic Panel:    Component Value Date/Time   NA 136 06/24/2022 0044   NA 134 (L) 06/23/2022 1604   NA 136 05/08/2013 0058   K 3.3 (L) 06/24/2022 0044   K 3.5 06/23/2022 1604   K 3.6 05/08/2013 0058   CL 107 06/24/2022 0044   CL 105 06/23/2022 1604   CL 102 05/08/2013 0058    CO2 24 06/24/2022 0044   CO2 23 06/23/2022 1604   CO2 25 05/08/2013 0058   BUN 8 06/24/2022 0044   BUN 11 06/23/2022 1604   BUN 12 05/08/2013 0058   CREATININE 0.47 (L) 06/24/2022 0044   CREATININE 0.67 06/23/2022 1604   CREATININE 0.98 05/08/2013 0058   GLUCOSE 88 06/24/2022 0044   GLUCOSE 98 06/23/2022 1604   GLUCOSE 114 (H) 05/08/2013 0058   CALCIUM 7.8 (L) 06/24/2022 0044   CALCIUM 7.8 (L) 06/23/2022 1604   CALCIUM 10.7 (H) 05/08/2013 0058   AST 6 (L) 06/24/2022 0044   AST 13 (L) 06/23/2022 0423   AST 31 05/08/2013 0058   ALT 8 06/24/2022 0044   ALT 11 06/23/2022 0423   ALT 32 05/08/2013 0058   ALKPHOS 76 06/24/2022 0044   ALKPHOS 96 06/23/2022 0423   ALKPHOS 85 05/08/2013 0058   BILITOT 0.9 06/24/2022 0044   BILITOT 0.5 06/23/2022 0423   BILITOT 1.6 (H) 05/08/2013 0058   PROT 6.4 (L) 06/24/2022 0044   PROT 7.9 06/23/2022 0423   PROT 9.2 (H) 05/08/2013 0058   ALBUMIN 2.0 (L) 06/24/2022 0044   ALBUMIN 2.5 (L) 06/23/2022 0423   ALBUMIN 5.4 (H) 05/08/2013 0058    Studies/Results: DG Foot Complete Left  Result Date: 06/23/2022 CLINICAL DATA:  31 year old male with possible  sepsis. Open sores and swelling of left foot. EXAM: LEFT FOOT - COMPLETE 3+ VIEW COMPARISON:  Left ankle series 07/27/2021. FINDINGS: Moderate to severe soft tissue swelling in the left foot especially at the dorsum of the foot and more pronounced distally. No definite soft tissue gas. No radiopaque foreign body identified. Underlying bone mineralization remains within normal limits. No fracture, dislocation, or cortical osteolysis identified. Maintained joint spaces. IMPRESSION: Moderate to severe soft tissue swelling. No tracking soft tissue gas or plain radiographic evidence of osteomyelitis. Electronically Signed   By: Genevie Ann M.D.   On: 06/23/2022 06:03   DG Chest Port 1 View  Result Date: 06/23/2022 CLINICAL DATA:  31 year old male with possible sepsis. EXAM: PORTABLE CHEST 1 VIEW COMPARISON:   Chest radiographs 05/18/2022 and earlier. FINDINGS: Portable AP semi upright views at 0539 hours. Lower lung volumes and kyphotic positioning. Allowing for portable technique the lungs are clear. Normal cardiac size and mediastinal contours. Visualized tracheal air column is within normal limits. Visible bowel gas pattern is within normal limits. Faintly visible anterior and posterior cervical spine fusion hardware better demonstrated on prior exams. No other osseous abnormality identified. IMPRESSION: Negative portable chest. Electronically Signed   By: Genevie Ann M.D.   On: 06/23/2022 06:00      Armandina Gemma 06/24/2022   Patient ID: Steve [name], male   DOB: Jul 18, 1991, 31 y.o.   MRN: [id number]

## 2022-06-24 NOTE — Assessment & Plan Note (Signed)
Smoking cessation Patient was offered and declined nicotine replacement.

## 2022-06-24 NOTE — Assessment & Plan Note (Signed)
Protein supplementation Consider dietitian evaluation

## 2022-06-24 NOTE — Assessment & Plan Note (Signed)
Continue PPI ?

## 2022-06-24 NOTE — Assessment & Plan Note (Signed)
TOC referral

## 2022-06-24 NOTE — Assessment & Plan Note (Addendum)
Cefepime and metronidazole and Vanco per pharmacy. Blood cultures reveal Staph aureus Analgesics as needed Wound care has been consulted General surgery is awaiting imaging Infectious disease consult has been done, await recommendations.

## 2022-06-24 NOTE — Assessment & Plan Note (Signed)
Due to Staphylococcus Antibiotics as above

## 2022-06-24 NOTE — Assessment & Plan Note (Signed)
Previously 133, today 136 Resolved Trend

## 2022-06-24 NOTE — Progress Notes (Signed)
[facility] Progress Note Patient Name: Steve [name] DOB: 11-Dec-1990 MRN: [id number]   Date of Service  06/24/2022  HPI/Events of Note  Anemia - Hgb after 1 unit PRVC = 6.7.   eICU Interventions  Will transfuse 1 unit PRBC now.      Intervention Category Major Interventions: Other:  Abelardo Seidner Cornelia Copa 06/24/2022, 2:10 AM

## 2022-06-24 NOTE — Assessment & Plan Note (Signed)
Appreciate ID input likely related to his stage IV decubitus ulcer. Antibiotics as above.

## 2022-06-24 NOTE — Consult Note (Signed)
[facility] Face-to-Face Psychiatry Consult   Reason for Consult:  psych evaluation and medication management Referring Physician:  Dr. Kennon Rounds Patient Identification: Steve [name] MRN:  [id number] Principal Diagnosis: SIRS (systemic inflammatory response syndrome) ([place]) Diagnosis:  Principal Problem:   SIRS (systemic inflammatory response syndrome) (HCC) Active Problems:   Anxiety and depression   Chronic post-traumatic stress disorder (PTSD)   Amphetamine and psychostimulant-induced psychosis with hallucinations (HCC)   Hyponatremia   Malnutrition of moderate degree   History of schizophrenia   Normocytic anemia   Chronic multifocal osteomyelitis, left femur (HCC)   Amphetamine abuse (HCC)   Gastroesophageal reflux disease without esophagitis   Tobacco use disorder   Cellulitis of left foot   Thrombocytosis   Unstageable pressure ulcer of left hip (HCC)   Sepsis due to undetermined organism ([place])   Staphylococcus aureus bacteremia   Total Time spent with patient: 45 minutes  Subjective:   Steve [name] is a 31 y.o. male patient with psychiatric history of schizophrenia , ptsd, and substance use disorder, and medical history of paraplegia, who was admitted to the hospital for leg cellulitis, worsened decubitus ulcer on the left thigh, sepsis with acute metabolic encephalopathy. Patient was reportedly off psychiatric medications for about  a week. Psychiatry consulted for psych evaluation and medication management.  Per chart review, patient`s home psych medication Abilify, Zoloft and Trazodone restarted on admission.  HPI:  Patient appears slightly lethargic on assessment, although agreed to participate in the interview.  He reports he was diagnosed with schizophrenia years ago and follows at the [place]. Currently identifies his mood as "tired, mildly depressed and anxious" in settings of current admission and medical condition. He admits to running out of Abilify a week ago or so.  Reports that "I heard things and saw things" while off the medication. He denies having any hallucinations currently after the medication was restarted. He thinks he is mentally-stable currently. Denies feeling hopeless, denies feeling of worthlessness, denies thoughts of death or suicide. Denies having any thoughts or plans of harming self or others. He reports one past suicidal attempt "many years ago". He reports they have no guns at home. His wife is his main caregiver. Patient denies any current or past symptoms of mania, such as increased energy, feeling irritable, easily distractible, unusual talkativeness. Denies decreased need for sleep. Patient does not express any delusions. Patient reports feeling safe in their environment. No med side effects reported. Patient admits to occasional methamphetamine and THC use. Denies alcohol use.  Past Psychiatric History:  Previous psych diagnoses: schizophrenia, depression, ptsd. Previous psychiatric hospitalizations: reports one "years ago" [facility] History of prior suicide attempts: reports one "years ago" Non-suicidal self-injurious behaviors: denies History of violence: denies Home psych medications: aripiprazole 15 mg p.o. daily. sertraline 25 mg p.o. daily. trazodone 50 mg as needed at bedtime.  Social History:  Patient has no guardian. Married. Wife is a caregiver. No guns.   Past Medical History:  Past Medical History:  Diagnosis Date   Amphetamine and psychostimulant-induced psychosis with hallucinations ([place]) 04/25/2016   Anxiety    Anxiety and depression 11/12/2015   Chronic osteomyelitis, pelvis, left ([place]) 05/30/2021   Paranoid schizophrenia ([place])    Paraplegia (HCC)    PTSD (post-traumatic stress disorder) Paranoid    Past Surgical History:  Procedure Laterality Date   banding procedure for morbid obesity     I & D EXTREMITY Right 02/27/2014   Procedure: IRRIGATION AND DEBRIDEMENT FOREARM AND REPAIR  OF 30cm  LACERATION;  Surgeon: Johnny Bridge, MD;  Location: [place];  Service: Orthopedics;  Laterality: Right;  Anesthesia Regional with MAC   TEE WITHOUT CARDIOVERSION N/A 08/01/2021   Procedure: TRANSESOPHAGEAL ECHOCARDIOGRAM (TEE);  Surgeon: Pixie Casino, MD;  Location: [facility] ENDOSCOPY;  Service: Cardiovascular;  Laterality: N/A;   Family History:  Family History  Problem Relation Age of Onset   Depression Mother    Bipolar disorder Father    Family Psychiatric  History: unknown Social History:  Social History   Substance and Sexual Activity  Alcohol Use Not Currently     Social History   Substance and Sexual Activity  Drug Use Yes   Types: Marijuana, Methamphetamines    Social History   Socioeconomic History   Marital status: Married    Spouse name: Not on file   Number of children: Not on file   Years of education: Not on file   Highest education level: Not on file  Occupational History   Not on file  Tobacco Use   Smoking status: Some Days    Packs/day: 0.25    Types: Cigarettes   Smokeless tobacco: Never  Vaping Use   Vaping Use: Unknown  Substance and Sexual Activity   Alcohol use: Not Currently   Drug use: Yes    Types: Marijuana, Methamphetamines   Sexual activity: Not Currently  Other Topics Concern   Not on file  Social History Narrative   Not on file   Social Determinants of Health   Financial Resource Strain: Not on file  Food Insecurity: Not on file  Transportation Needs: Not on file  Physical Activity: Not on file  Stress: Not on file  Social Connections: Not on file   Additional Social History:    Allergies:   Allergies  Allergen Reactions   Caffeine Anaphylaxis   Chocolate Anaphylaxis   Tuberculin, Ppd     Other reaction(s): Eruption of skin   Sulfa Antibiotics Rash    Labs:  Results for orders placed or performed during the hospital encounter of 06/23/22 (from the past 48 hour(s))  Lactic acid, plasma     Status: None    Collection Time: 06/23/22  4:23 AM  Result Value Ref Range   Lactic Acid, Venous 1.9 0.5 - 1.9 mmol/L    Comment: Performed at [facility], [place] [address] [place],  [address]  Comprehensive metabolic panel     Status: Abnormal   Collection Time: 06/23/22  4:23 AM  Result Value Ref Range   Sodium 134 (L) 135 - 145 mmol/L   Potassium 3.6 3.5 - 5.1 mmol/L   Chloride 102 98 - 111 mmol/L   CO2 24 22 - 32 mmol/L   Glucose, Bld 115 (H) 70 - 99 mg/dL    Comment: Glucose reference range applies only to samples taken after fasting for at least 8 hours.   BUN 13 6 - 20 mg/dL   Creatinine, Ser 0.73 0.61 - 1.24 mg/dL   Calcium 8.4 (L) 8.9 - 10.3 mg/dL   Total Protein 7.9 6.5 - 8.1 g/dL   Albumin 2.5 (L) 3.5 - 5.0 g/dL   AST 13 (L) 15 - 41 U/L   ALT 11 0 - 44 U/L   Alkaline Phosphatase 96 38 - 126 U/L   Total Bilirubin 0.5 0.3 - 1.2 mg/dL   GFR, Estimated >60 >60 mL/min    Comment: (NOTE) Calculated using the CKD-EPI Creatinine Equation (2021)    Anion gap 8 5 - 15  Comment: Performed at [facility], [place] [address], [place], [place] [address]  CBC with Differential     Status: Abnormal   Collection Time: 06/23/22  4:23 AM  Result Value Ref Range   WBC 9.6 4.0 - 10.5 K/uL   RBC 3.07 (L) 4.22 - 5.81 MIL/uL   Hemoglobin 7.9 (L) 13.0 - 17.0 g/dL   HCT 25.6 (L) 39.0 - 52.0 %   MCV 83.4 80.0 - 100.0 fL   MCH 25.7 (L) 26.0 - 34.0 pg   MCHC 30.9 30.0 - 36.0 g/dL   RDW 16.8 (H) 11.5 - 15.5 %   Platelets 462 (H) 150 - 400 K/uL   nRBC 0.0 0.0 - 0.2 %   Neutrophils Relative % 72 %   Neutro Abs 6.9 1.7 - 7.7 K/uL   Lymphocytes Relative 18 %   Lymphs Abs 1.7 0.7 - 4.0 K/uL   Monocytes Relative 7 %   Monocytes Absolute 0.7 0.1 - 1.0 K/uL   Eosinophils Relative 2 %   Eosinophils Absolute 0.2 0.0 - 0.5 K/uL   Basophils Relative 0 %   Basophils Absolute 0.0 0.0 - 0.1 K/uL   Immature Granulocytes 1 %   Abs Immature Granulocytes 0.06 0.00 - 0.07  K/uL    Comment: Performed at [facility], [place] [address], [place], [place] [address]  Protime-INR     Status: None   Collection Time: 06/23/22  4:23 AM  Result Value Ref Range   Prothrombin Time 15.2 11.4 - 15.2 seconds   INR 1.2 0.8 - 1.2    Comment: (NOTE) INR goal varies based on device and disease states. Performed at [facility], [place] [address] [place], [place] [address]   APTT     Status: Abnormal   Collection Time: 06/23/22  4:23 AM  Result Value Ref Range   aPTT 39 (H) 24 - 36 seconds    Comment:        IF BASELINE aPTT IS ELEVATED, SUGGEST PATIENT RISK ASSESSMENT BE USED TO DETERMINE APPROPRIATE ANTICOAGULANT THERAPY. Performed at [facility], [place] [address], [place], [place] [address]   Blood Culture (routine x 2)     Status: Abnormal (Preliminary result)   Collection Time: 06/23/22  4:23 AM   Specimen: BLOOD  Result Value Ref Range   Specimen Description      BLOOD RIGHT ANTECUBITAL Performed at [place] [address] [place], [place] [address]    Special Requests      BOTTLES DRAWN AEROBIC AND ANAEROBIC Blood Culture results may not be optimal due to an excessive volume of blood received in culture bottles Performed at [facility] [address], [place], [place] [address]    Culture  Setup Time      AEROBIC BOTTLE ONLY GRAM POSITIVE COCCI IN CLUSTERS CRITICAL RESULT CALLED TO, READ BACK BY AND VERIFIED WITH:  C/ PHARMD E. JACKSON 06/23/22 2250 A. LAFRANCE     Culture (A)     STAPHYLOCOCCUS AUREUS SUSCEPTIBILITIES TO FOLLOW Performed at [facility], [place] [address], [place], [place] [address]    Report Status PENDING   Urinalysis, Routine w reflex microscopic     Status: Abnormal   Collection Time: 06/23/22  4:23 AM  Result Value Ref Range   Color, Urine AMBER (A) YELLOW    Comment: BIOCHEMICALS MAY BE AFFECTED BY COLOR   APPearance HAZY  (A) CLEAR   Specific Gravity, Urine 1.028  1.005 - 1.030   pH 5.0 5.0 - 8.0   Glucose, UA NEGATIVE NEGATIVE mg/dL   Hgb urine dipstick SMALL (A) NEGATIVE   Bilirubin Urine NEGATIVE NEGATIVE   Ketones, ur NEGATIVE NEGATIVE mg/dL   Protein, ur 100 (A) NEGATIVE mg/dL   Nitrite POSITIVE (A) NEGATIVE   Leukocytes,Ua SMALL (A) NEGATIVE   RBC / HPF 21-50 0 - 5 RBC/hpf   WBC, UA >50 (H) 0 - 5 WBC/hpf   Bacteria, UA MANY (A) NONE SEEN   Squamous Epithelial / LPF 0-5 0 - 5   Mucus PRESENT    Hyaline Casts, UA PRESENT     Comment: Performed at [facility], [place] [address] [place], [place] [address]  Urine Culture     Status: Abnormal (Preliminary result)   Collection Time: 06/23/22  4:23 AM   Specimen: In/Out Cath Urine  Result Value Ref Range   Specimen Description      IN/OUT CATH URINE Performed at [facility], [place] [address], [place], [place] [address]    Special Requests      Normal Performed at [facility], [place] [address], [place], [place] [address]    Culture (A)     >=100,000 COLONIES/mL ESCHERICHIA COLI SUSCEPTIBILITIES TO FOLLOW Performed at [place] [address] [place], [place] [address]    Report Status PENDING   Blood Culture ID Panel (Reflexed)     Status: Abnormal   Collection Time: 06/23/22  4:23 AM  Result Value Ref Range   Enterococcus faecalis NOT DETECTED NOT DETECTED   Enterococcus Faecium NOT DETECTED NOT DETECTED   Listeria monocytogenes NOT DETECTED NOT DETECTED   Staphylococcus species DETECTED (A) NOT DETECTED    Comment: CRITICAL RESULT CALLED TO, READ BACK BY AND VERIFIED WITH:  C/ PHARMD E. JACKSON 06/23/22 2250 A. LAFRANCE     Staphylococcus aureus (BCID) DETECTED (A) NOT DETECTED    Comment: Methicillin (oxacillin)-resistant Staphylococcus aureus (MRSA). MRSA is predictably resistant to beta-lactam antibiotics (except ceftaroline). Preferred therapy is vancomycin unless  clinically contraindicated. Patient requires contact precautions if  hospitalized. CRITICAL RESULT CALLED TO, READ BACK BY AND VERIFIED WITH:  C/ PHARMD E. JACKSON 06/23/22 2250 A. LAFRANCE     Staphylococcus epidermidis NOT DETECTED NOT DETECTED   Staphylococcus lugdunensis NOT DETECTED NOT DETECTED   Streptococcus species NOT DETECTED NOT DETECTED   Streptococcus agalactiae NOT DETECTED NOT DETECTED   Streptococcus pneumoniae NOT DETECTED NOT DETECTED   Streptococcus pyogenes NOT DETECTED NOT DETECTED   A.calcoaceticus-baumannii NOT DETECTED NOT DETECTED   Bacteroides fragilis NOT DETECTED NOT DETECTED   Enterobacterales NOT DETECTED NOT DETECTED   Enterobacter cloacae complex NOT DETECTED NOT DETECTED   Escherichia coli NOT DETECTED NOT DETECTED   Klebsiella aerogenes NOT DETECTED NOT DETECTED   Klebsiella oxytoca NOT DETECTED NOT DETECTED   Klebsiella pneumoniae NOT DETECTED NOT DETECTED   Proteus species NOT DETECTED NOT DETECTED   Salmonella species NOT DETECTED NOT DETECTED   Serratia marcescens NOT DETECTED NOT DETECTED   Haemophilus influenzae NOT DETECTED NOT DETECTED   Neisseria meningitidis NOT DETECTED NOT DETECTED   Pseudomonas aeruginosa NOT DETECTED NOT DETECTED   Stenotrophomonas maltophilia NOT DETECTED NOT DETECTED   Candida albicans NOT DETECTED NOT DETECTED   Candida auris NOT DETECTED NOT DETECTED   Candida glabrata NOT DETECTED NOT DETECTED   Candida krusei NOT DETECTED NOT DETECTED   Candida parapsilosis NOT DETECTED NOT DETECTED   Candida tropicalis NOT DETECTED NOT DETECTED  Cryptococcus neoformans/gattii NOT DETECTED NOT DETECTED   Meth resistant mecA/C and MREJ DETECTED (A) NOT DETECTED    Comment: CRITICAL RESULT CALLED TO, READ BACK BY AND VERIFIED WITH:  C/ PHARMD E. JACKSON 06/23/22 2250 A. LAFRANCE  Performed at [facility], [place] [address], [place], [place] [address]   Blood Culture (routine x 2)     Status: None (Preliminary result)    Collection Time: 06/23/22  4:28 AM   Specimen: BLOOD  Result Value Ref Range   Specimen Description      BLOOD BLOOD RIGHT HAND Performed at [facility] [address], [place], [place] [address]    Special Requests      BOTTLES DRAWN AEROBIC ONLY Blood Culture adequate volume Performed at [facility] [address], [place], [place] [address]    Culture      NO GROWTH 1 DAY Performed at [facility] [address] [place], [place] [address]    Report Status PENDING   Resp Panel by RT-PCR (Flu A&B, Covid) Anterior Nasal Swab     Status: None   Collection Time: 06/23/22  5:01 AM   Specimen: Anterior Nasal Swab  Result Value Ref Range   SARS Coronavirus 2 by RT PCR NEGATIVE NEGATIVE    Comment: (NOTE) SARS-CoV-2 target nucleic acids are NOT DETECTED.  The SARS-CoV-2 RNA is generally detectable in upper respiratory specimens during the acute phase of infection. The lowest concentration of SARS-CoV-2 viral copies this assay can detect is 138 copies/mL. A negative result does not preclude SARS-Cov-2 infection and should not be used as the sole basis for treatment or other patient management decisions. A negative result may occur with  improper specimen collection/handling, submission of specimen other than nasopharyngeal swab, presence of viral mutation(s) within the areas targeted by this assay, and inadequate number of viral copies(<138 copies/mL). A negative result must be combined with clinical observations, patient history, and epidemiological information. The expected result is Negative.  Fact Sheet for Patients:  EntrepreneurPulse.com.au  Fact Sheet for Healthcare Providers:  IncredibleEmployment.be  This test is no t yet approved or cleared by the [place] FDA and  has been authorized for detection and/or diagnosis of SARS-CoV-2 by FDA under an Emergency Use Authorization (EUA).  This EUA will remain  in effect (meaning this test can be used) for the duration of the COVID-19 declaration under Section 564(b)(1) of the Act, 21 U.S.C.section 360bbb-3(b)(1), unless the authorization is terminated  or revoked sooner.       Influenza A by PCR NEGATIVE NEGATIVE   Influenza B by PCR NEGATIVE NEGATIVE    Comment: (NOTE) The Xpert Xpress SARS-CoV-2/FLU/RSV plus assay is intended as an aid in the diagnosis of influenza from Nasopharyngeal swab specimens and should not be used as a sole basis for treatment. Nasal washings and aspirates are unacceptable for Xpert Xpress SARS-CoV-2/FLU/RSV testing.  Fact Sheet for Patients: EntrepreneurPulse.com.au  Fact Sheet for Healthcare Providers: IncredibleEmployment.be  This test is not yet approved or cleared by the [place] FDA and has been authorized for detection and/or diagnosis of SARS-CoV-2 by FDA under an Emergency Use Authorization (EUA). This EUA will remain in effect (meaning this test can be used) for the duration of the COVID-19 declaration under Section 564(b)(1) of the Act, 21 U.S.C. section 360bbb-3(b)(1), unless the authorization is terminated or revoked.  Performed at [facility], [place] [address], [place], [place] [address]   Rapid urine drug screen (  hospital performed)     Status: Abnormal   Collection Time: 06/23/22  6:33 AM  Result Value Ref Range   Opiates NONE DETECTED NONE DETECTED   Cocaine NONE DETECTED NONE DETECTED   Benzodiazepines NONE DETECTED NONE DETECTED   Amphetamines POSITIVE (A) NONE DETECTED   Tetrahydrocannabinol NONE DETECTED NONE DETECTED   Barbiturates NONE DETECTED NONE DETECTED    Comment: (NOTE) DRUG SCREEN FOR MEDICAL PURPOSES ONLY.  IF CONFIRMATION IS NEEDED FOR ANY PURPOSE, NOTIFY LAB WITHIN 5 DAYS.  LOWEST DETECTABLE LIMITS FOR URINE DRUG SCREEN Drug Class                     Cutoff (ng/mL) Amphetamine and  metabolites    1000 Barbiturate and metabolites    200 Benzodiazepine                 465 Tricyclics and metabolites     300 Opiates and metabolites        300 Cocaine and metabolites        300 THC                            50 Performed at [facility], [place] [address] [place], [place] [address]   Lactic acid, plasma     Status: None   Collection Time: 06/23/22  6:50 AM  Result Value Ref Range   Lactic Acid, Venous 0.9 0.5 - 1.9 mmol/L    Comment: Performed at [facility], [place] [address] [place], [place] [address]  Magnesium     Status: None   Collection Time: 06/23/22  4:04 PM  Result Value Ref Range   Magnesium 1.7 1.7 - 2.4 mg/dL    Comment: Performed at [facility], [place] [address], [place], [place] [address]  Phosphorus     Status: None   Collection Time: 06/23/22  4:04 PM  Result Value Ref Range   Phosphorus 3.8 2.5 - 4.6 mg/dL    Comment: Performed at [facility], [place] [address] [place], [place] [address]  Lactic acid, plasma     Status: None   Collection Time: 06/23/22  4:04 PM  Result Value Ref Range   Lactic Acid, Venous 1.3 0.5 - 1.9 mmol/L    Comment: Performed at [facility], [place] [address], [place], [place] [address]  Basic metabolic panel     Status: Abnormal   Collection Time: 06/23/22  4:04 PM  Result Value Ref Range   Sodium 134 (L) 135 - 145 mmol/L   Potassium 3.5 3.5 - 5.1 mmol/L   Chloride 105 98 - 111 mmol/L   CO2 23 22 - 32 mmol/L   Glucose, Bld 98 70 - 99 mg/dL    Comment: Glucose reference range applies only to samples taken after fasting for at least 8 hours.   BUN 11 6 - 20 mg/dL   Creatinine, Ser 0.67 0.61 - 1.24 mg/dL   Calcium 7.8 (L) 8.9 - 10.3 mg/dL   GFR, Estimated >60 >60 mL/min    Comment: (NOTE) Calculated using the CKD-EPI Creatinine Equation (2021)    Anion gap 6 5 - 15    Comment: Performed at [facility], [place] [address], [place], [place] [address]  CBC     Status: Abnormal   Collection Time: 06/23/22  4:04 PM  Result Value Ref Range   WBC 5.8 4.0 -  10.5 K/uL   RBC 2.22 (L) 4.22 - 5.81 MIL/uL   Hemoglobin 5.7 (LL) 13.0 - 17.0 g/dL    Comment: This critical result has verified and been called to Erskine Squibb, RN by Marvell Fuller on 10 07 2023 at 1652, and has been read back. CRITICAL RESULT VERIFIED   HCT 18.8 (L) 39.0 - 52.0 %   MCV 84.7 80.0 - 100.0 fL   MCH 25.7 (L) 26.0 - 34.0 pg   MCHC 30.3 30.0 - 36.0 g/dL   RDW 16.8 (H) 11.5 - 15.5 %   Platelets 268 150 - 400 K/uL   nRBC 0.0 0.0 - 0.2 %    Comment: Performed at [facility], [place] [address], [place], [place] [address]  CBC     Status: Abnormal   Collection Time: 06/23/22  5:28 PM  Result Value Ref Range   WBC 6.7 4.0 - 10.5 K/uL   RBC 2.47 (L) 4.22 - 5.81 MIL/uL   Hemoglobin 6.3 (LL) 13.0 - 17.0 g/dL    Comment: REPEATED TO VERIFY THIS CRITICAL RESULT HAS VERIFIED AND BEEN CALLED TO [facility],C BY PATRICIA LUZOLO ON 10 07 2023 AT 1934, AND HAS BEEN READ BACK. CRITICAL RESULT VERIFIED    HCT 21.2 (L) 39.0 - 52.0 %   MCV 85.8 80.0 - 100.0 fL   MCH 25.5 (L) 26.0 - 34.0 pg   MCHC 29.7 (L) 30.0 - 36.0 g/dL   RDW 16.8 (H) 11.5 - 15.5 %   Platelets 352 150 - 400 K/uL   nRBC 0.0 0.0 - 0.2 %    Comment: Performed at [facility], [place] [address], [place], [place] [address]  Blood gas, venous     Status: Abnormal   Collection Time: 06/23/22  5:36 PM  Result Value Ref Range   pH, Ven 7.4 7.25 - 7.43   pCO2, Ven 40 (L) 44 - 60 mmHg   pO2, Ven 53 (H) 32 - 45 mmHg   Bicarbonate 24.7 20.0 - 28.0 mmol/L   Acid-Base Excess 0.1 0.0 - 2.0 mmol/L   O2 Saturation 84.3 %   Patient temperature 37.7     Comment: Performed at [facility], [place] [address] [place], [place] [address]  Ammonia     Status: None   Collection Time: 06/23/22  5:36 PM  Result Value Ref Range   Ammonia 21 9 - 35 umol/L     Comment: Performed at [facility], [place] [address], [place], [place] [address]  Lactic acid, plasma     Status: None   Collection Time: 06/23/22  5:36 PM  Result Value Ref Range   Lactic Acid, Venous 0.7 0.5 - 1.9 mmol/L    Comment: Performed at [facility], [place] [address] [place], [place] [address]  Type and screen [place]     Status: None (Preliminary result)   Collection Time: 06/23/22  5:36 PM  Result Value Ref Range   ABO/RH(D) A POS    Antibody Screen NEG    Sample Expiration      06/26/2022,2359 Performed at [place] [address] [place], [place] [address]    Unit Number [id number]    Blood Component Type RED CELLS,LR    Unit division 00    Status of Unit ISSUED    Transfusion Status OK TO TRANSFUSE    Crossmatch Result Compatible    Unit Number [id number]    Blood Component Type RBC  LR PHER2    Unit division 00    Status of Unit ISSUED    Transfusion Status OK TO TRANSFUSE    Crossmatch Result Compatible    Unit Number [id number]    Blood Component Type RED CELLS,LR    Unit division 00    Status of Unit ALLOCATED    Transfusion Status OK TO TRANSFUSE    Crossmatch Result Compatible   CK     Status: Abnormal   Collection Time: 06/23/22  5:36 PM  Result Value Ref Range   Total CK 26 (L) 49 - 397 U/L    Comment: Performed at [facility], [place] [address] [place], [place] [address]  Prepare RBC (crossmatch)     Status: None   Collection Time: 06/23/22  5:36 PM  Result Value Ref Range   Order Confirmation      ORDER PROCESSED BY BLOOD BANK Performed at [facility], [place] [address] [place], [place] [address]   DIC Panel ONCE - STAT     Status: Abnormal   Collection Time: 06/23/22  5:36 PM  Result Value Ref Range   Prothrombin Time 15.8 (H) 11.4 - 15.2 seconds   INR 1.3 (H) 0.8 - 1.2    Comment: (NOTE) INR goal varies based  on device and disease states.    aPTT 42 (H) 24 - 36 seconds    Comment:        IF BASELINE aPTT IS ELEVATED, SUGGEST PATIENT RISK ASSESSMENT BE USED TO DETERMINE APPROPRIATE ANTICOAGULANT THERAPY.    Fibrinogen 742 (H) 210 - 475 mg/dL    Comment: (NOTE) Fibrinogen results may be underestimated in patients receiving thrombolytic therapy.    D-Dimer, Quant 1.80 (H) 0.00 - 0.50 ug/mL-FEU    Comment: (NOTE) At the manufacturer cut-off value of 0.5 g/mL FEU, this assay has a negative predictive value of 95-100%.This assay is intended for use in conjunction with a clinical pretest probability (PTP) assessment model to exclude pulmonary embolism (PE) and deep venous thrombosis (DVT) in outpatients suspected of PE or DVT. Results should be correlated with clinical presentation.    Platelets 311 150 - 400 K/uL   Smear Review NO SCHISTOCYTES SEEN     Comment: Performed at [facility], [place] [address], [place], [place] [address]  MRSA Next Gen by PCR, Nasal     Status: Abnormal   Collection Time: 06/23/22  5:53 PM   Specimen: Nasal Mucosa; Nasal Swab  Result Value Ref Range   MRSA by PCR Next Gen DETECTED (A) NOT DETECTED    Comment: CRITICAL RESULT CALLED TO, READ BACK BY AND VERIFIED WITH: BLOCK,D. 06/23/22 _0  BY SEEL,M. (NOTE) The GeneXpert MRSA Assay (FDA approved for NASAL specimens only), is one component of a comprehensive MRSA colonization surveillance program. It is not intended to diagnose MRSA infection nor to guide or monitor treatment for MRSA infections. Test performance is not FDA approved in patients less than 33 years old. Performed at [facility], [place] [address] [place], [place] [address]   HIV Antibody (routine testing w rflx)     Status: None   Collection Time: 06/24/22 12:44 AM  Result Value Ref Range   HIV Screen 4th Generation wRfx Non Reactive Non Reactive    Comment: Performed at [facility], [place]  [address] [place], [place] [address]  CBC     Status: Abnormal   Collection Time: 06/24/22 12:44 AM  Result Value Ref Range  WBC 5.1 4.0 - 10.5 K/uL   RBC 2.61 (L) 4.22 - 5.81 MIL/uL   Hemoglobin 6.7 (LL) 13.0 - 17.0 g/dL    Comment: CRITICAL VALUE NOTED.  VALUE IS CONSISTENT WITH PREVIOUSLY REPORTED AND CALLED VALUE.   HCT 22.5 (L) 39.0 - 52.0 %   MCV 86.2 80.0 - 100.0 fL   MCH 25.7 (L) 26.0 - 34.0 pg   MCHC 29.8 (L) 30.0 - 36.0 g/dL   RDW 16.5 (H) 11.5 - 15.5 %   Platelets 287 150 - 400 K/uL   nRBC 0.0 0.0 - 0.2 %    Comment: Performed at [facility], [place] [address], [place], [place] [address]  Comprehensive metabolic panel     Status: Abnormal   Collection Time: 06/24/22 12:44 AM  Result Value Ref Range   Sodium 136 135 - 145 mmol/L   Potassium 3.3 (L) 3.5 - 5.1 mmol/L   Chloride 107 98 - 111 mmol/L   CO2 24 22 - 32 mmol/L   Glucose, Bld 88 70 - 99 mg/dL    Comment: Glucose reference range applies only to samples taken after fasting for at least 8 hours.   BUN 8 6 - 20 mg/dL   Creatinine, Ser 0.47 (L) 0.61 - 1.24 mg/dL   Calcium 7.8 (L) 8.9 - 10.3 mg/dL   Total Protein 6.4 (L) 6.5 - 8.1 g/dL   Albumin 2.0 (L) 3.5 - 5.0 g/dL   AST 6 (L) 15 - 41 U/L   ALT 8 0 - 44 U/L   Alkaline Phosphatase 76 38 - 126 U/L   Total Bilirubin 0.9 0.3 - 1.2 mg/dL   GFR, Estimated >60 >60 mL/min    Comment: (NOTE) Calculated using the CKD-EPI Creatinine Equation (2021)    Anion gap 5 5 - 15    Comment: Performed at [facility], [place] [address] [place], [place] [address]  Prepare RBC (crossmatch)     Status: None   Collection Time: 06/24/22  2:22 AM  Result Value Ref Range   Order Confirmation      ORDER PROCESSED BY BLOOD BANK Performed at [facility] [address] [place], [place] [address]   Glucose, capillary     Status: Abnormal   Collection Time: 06/24/22  8:16 AM  Result Value Ref Range   Glucose-Capillary 103 (H) 70 - 99  mg/dL    Comment: Glucose reference range applies only to samples taken after fasting for at least 8 hours.  Hemoglobin and hematocrit, blood     Status: Abnormal   Collection Time: 06/24/22  8:32 AM  Result Value Ref Range   Hemoglobin 7.7 (L) 13.0 - 17.0 g/dL   HCT 24.5 (L) 39.0 - 52.0 %    Comment: Performed at [facility], [place] [address], [place], [place] [address]    Current Facility-Administered Medications  Medication Dose Route Frequency Provider Last Rate Last Admin   0.9 %  sodium chloride infusion   Intravenous PRN Reubin Milan, MD 10 mL/hr at 06/24/22 0731 Infusion Verify at 06/24/22 0731   acetaminophen (TYLENOL) tablet 650 mg  650 mg Oral Q6H PRN Reubin Milan, MD   650 mg at 06/23/22 1529   Or   acetaminophen (TYLENOL) suppository 650 mg  650 mg Rectal Q6H PRN Reubin Milan, MD       ARIPiprazole (ABILIFY) tablet 15 mg  15 mg Oral Daily Reubin Milan, MD   15 mg at 06/24/22  0900   Chlorhexidine Gluconate Cloth 2 % PADS 6 each  6 each Topical Daily Reubin Milan, MD   6 each at 06/24/22 0901   Chlorhexidine Gluconate Cloth 2 % PADS 6 each  6 each Topical Q0600 Reubin Milan, MD       fentaNYL (SUBLIMAZE) injection 50 mcg  50 mcg Intravenous Q1H PRN Donnamae Jude, MD   50 mcg at 06/24/22 1239   lactated ringers infusion   Intravenous Continuous Anders Simmonds, MD 75 mL/hr at 06/24/22 0731 Infusion Verify at 06/24/22 0731   meropenem (MERREM) 1 g in sodium chloride 0.9 % 100 mL IVPB  1 g Intravenous Q8H Eudelia Bunch, RPH   Stopped at 06/24/22 0543   midodrine (PROAMATINE) tablet 5 mg  5 mg Oral BID WC Reubin Milan, MD   5 mg at 06/24/22 0825   mupirocin ointment (BACTROBAN) 2 % 1 Application  1 Application Nasal BID Reubin Milan, MD   1 Application at 49/44/96 0901   norepinephrine (LEVOPHED) 20m in 2545m(0.016 mg/mL) premix infusion  2-10 mcg/min Intravenous Titrated OrReubin MilanMD 11.25 mL/hr  at 06/24/22 0731 3 mcg/min at 06/24/22 0731   ondansetron (ZOFRAN) tablet 4 mg  4 mg Oral Q6H PRN OrReubin MilanMD       Or   ondansetron ([facility] mg  4 mg Intravenous Q6H PRN OrReubin MilanMD       Oral care mouth rinse  15 mL Mouth Rinse PRN OrReubin MilanMD       oxyCODONE (ROXICODONE) 5 MG/5ML solution 5 mg  5 mg Oral Q6H PRN MeMaryjane HurterMD   5 mg at 06/24/22 0900   pantoprazole (PROTONIX) EC tablet 40 mg  40 mg Oral Daily OrReubin MilanMD   40 mg at 06/24/22 0900   potassium chloride SA (KLOR-CON M) CR tablet 20 mEq  20 mEq Oral BID PrDonnamae JudeMD       pregabalin (LYRICA) capsule 150 mg  150 mg Oral BID OrReubin MilanMD   150 mg at 06/24/22 0901   sertraline (ZOLOFT) tablet 25 mg  25 mg Oral Daily OrReubin MilanMD   25 mg at 06/24/22 0900   thiamine (VITAMIN B1) injection 100 mg  100 mg Intravenous Daily MeMaryjane HurterMD   100 mg at 06/24/22 0900   tiZANidine (ZANAFLEX) tablet 4 mg  4 mg Oral QHS PRN OrReubin MilanMD   4 mg at 06/24/22 0113   traZODone (DESYREL) tablet 50 mg  50 mg Oral QHS PRN OrReubin MilanMD       vancomycin (VANCOREADY) IVPB 1500 mg/300 mL  1,500 mg Intravenous Q12H ShRanda SpikeRPDigest[facility] Center [facility]      Psychiatric Specialty Exam:  Appearance:  CM, appearing stated age,  wearing hospital clothes, with notably decreased grooming and hygiene. Decreased level of alertness; appropriate facial expression.  Attitude/Behavior: calm, cooperative, engaging with poor eye contact due to sedation.  Motor: PMR  Speech: garbled, coherent, normal comprehension.  Mood: "mildly-depressed".  Affect: blunted.  Thought process: patient appears coherent, organized, goal-directed, linear.  Thought content: patient denies suicidal thoughts, denies homicidal thoughts; did not express any delusions.  Thought perception: patient denies current auditory and visual hallucinations. Did not appear internally  stimulated.  Cognition: patient is alert and oriented in self, place.  Insight: fair.  Judgement: fair.   Assets  Assets: Communication  Skills; Desire for Improvement   Sleep  Sleep:No data recorded  Physical Exam: Physical Exam ROS Blood pressure (!) 92/47, pulse 73, temperature 98.8 F (37.1 C), resp. rate 14, height _0  (1.803 m), weight 72.6 kg, SpO2 96 %. Body mass index is 22.32 kg/m.  Treatment Plan Summary:  Steve [name] is a 31 y.o. male patient with psychiatric history of schizophrenia , ptsd, and substance use disorder, and medical history of paraplegia, who was admitted to the hospital for leg cellulitis, worsened decubitus ulcer on the left thigh, sepsis with acute metabolic encephalopathy. Patient was reportedly off psychiatric medications for about  a week. Psychiatry consulted for psych evaluation and medication management. During my assessment, pt is calm, cooperative. He denies suicidal, homicidal thoughts. His hallucinations reportedly resolved after home medications were restarted. Patient does not meet criteria for an inpatient psychiatric admission at this time. Patient appears sedated and slightly-confused likely due to ongoing metabolic encephalopathy. Addressing the underlying medical condition and institution of preventative measures are recommended.  RECOMMENDATIONS:  -No indication for inpatient psychiatric hospitalization.  -continue outpatient psych medications: aripiprazole 15 mg p.o. daily. sertraline 25 mg p.o. daily. trazodone 50 mg as needed at bedtime.  - As with all hospitalized patients, would recommend delirium precautions: Minimize use of benzodiazepines, anticholinergics, and opiates, to the extent possible, as these can worsen mentation. Utilize non-pharmacological interventions including frequent reorientation, maintaining day/night distinction (blinds closed during night, open during day), minimizing excessive stimulation,  staff continuity, reorient the patient frequently, provide easily visible clock and calendar, provide sensory aids like glasses, hearing aids, encourage ambulation, regular activities and visitors to maintain cognitive stimulation.   -Disposition - per primary team. Follow-up with outpatient mental health provider.  Disposition: No evidence of imminent risk to self or others at present.   Patient does not meet criteria for psychiatric inpatient admission. Supportive therapy provided about ongoing stressors. Discussed crisis plan, support from social network, calling 911, coming to the Emergency Department, and calling Suicide Hotline.  Larita Fife, MD 06/24/2022 2:09 PM

## 2022-06-24 NOTE — Assessment & Plan Note (Signed)
Hemoglobin is 7.7 up from 6.7 status post 1 unit of packed red blood cells Anemia work-up Trend

## 2022-06-24 NOTE — Progress Notes (Signed)
[facility] Progress Note Patient Name: Steve [name] DOB: 03-14-1991 MRN: [id number]   Date of Service  06/24/2022  HPI/Events of Note  Patient has order for LR IV infusion at 150 mL/hour which will expire in 30 minutes. Patient making good urine and is now starting to take a PO diet and liquids.   eICU Interventions  Plan: Will change LR IV infusion rate to 75 mL/hour.      Intervention Category Major Interventions: Other:  Felipe Cabell Cornelia Copa 06/24/2022, 1:01 AM

## 2022-06-24 NOTE — Progress Notes (Signed)
Pharmacy Antibiotic Note  Steve [name] is a 31 y.o. 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  Pharmacy has been consulted for vancomycin and meropenem dosing.  Plan: Meropenem 1g IV q8h Vancomycin 1500 mg IV q12 (SCr rounded 0.8, TBW < IBW, est AUC 490)  Measure Vanc levels as needed.  Goal AUC = 400 - 550  Follow up renal function, culture results, and clinical course.   Height: 5\' 11"  (180.3 cm) Weight: 72.6 kg (160 lb) IBW/kg (Calculated) : 75.3  Temp (24hrs), Avg:98.4 F (36.9 C), Min:96.8 F (36 C), Max:101.1 F (38.4 C)  Recent Labs  Lab 06/23/22 0423 06/23/22 0650 06/23/22 1604 06/23/22 1728 06/23/22 1736 06/24/22 0044  WBC 9.6  --  5.8 6.7  --  5.1  CREATININE 0.73  --  0.67  --   --  0.47*  LATICACIDVEN 1.9 0.9 1.3  --  0.7  --      Estimated Creatinine Clearance: 136.1 mL/min (A) (by C-G formula based on SCr of 0.47 mg/dL (L)).    Allergies  Allergen Reactions   Caffeine Anaphylaxis   Chocolate Anaphylaxis   Tuberculin, Ppd     Other reaction(s): Eruption of skin   Sulfa Antibiotics Rash    Antimicrobials this admission: 10/7 cefepime x 2 doses 10/7 flagyl x 2 doses 10/7 vancomycin>> 10/7 Meropenem>>   Dose adjustments this admission:   Microbiology results: 10/7 BCx2: 1/3 bottles staph aureus MecA+ 10/7 UCx: >100k GNR Previously: 08/29/21 BCx Providencia rettgeri: R to ALL x cipro, imipenem & pip tazo  Thank you for allowing pharmacy to be a part of this patient's care.  Gretta Arab PharmD, BCPS [facility] main pharmacy [phone] 06/24/2022 8:29 AM

## 2022-06-24 NOTE — Consult Note (Signed)
[facility] for Infectious Disease    Date of Admission:  06/23/2022     Reason for Consult: staph aureus bacteremia    Referring Provider: Georgiann Mohs     Abx: 10/7-c vanc 10/7-c meropenem        Assessment: 31 yo male incomplete cspine quad, substance abuse, anxiety/depression, schizo, ptsd, chronic decub left thigh ulcer/femoral om, admitted 10/07 with sepsis found to have mrsa bacteremia/septic shock in setting open wound and chronic decub left thigh ulcer  Uds continues to be positive for amphetamine  He had prior providencia (esbl) bacteremia 08/2021 and was placed on empiric meropenem this admission  He has had hx staph aureus bacteremia late 2022 but negative w/u for endocarditis. He has no prior hx endocarditis to his knowledge otherwise  The source for the mrsa bacteremia this time (almost a year removed from last episode and likely unrelated) is again likely due to translocation from his left thigh decub. From the picture on admission of the left thigh decub, his wound appears rather similar to a month prior to this. There is some cellulitis changes next to wound.   The left foot has minor skin maceration which is healing well besides swelling, doesn't appear to be source  I suspect his current sepsis is due to mrsa bsi and some cellulitis. He had clinically improved   10/07 admission bcx remains without gnr/esbl outside of the [facility]  10/07 admission ucx with >100k ecoli -- susceptibility pending. He has leg spasm unclear if could be also attributed to uti in setting high spinal cord injury. Might need to treat for 7 days  Another concern I have is given his sx and incomplete sensation and leg spasm ?if he has spine involvement. I think it wouldn't be unreasonable to scan his spine however issue with ability to position himself supine limiting this  Plan: If blood cx remains similar tomorrow, reasonable to finish tx of mrsa bacteremia; might  need coverage for the urine ecoli pending susceptibility as well, for 7 days. This duration would cover soft tissue infection of the decubitus ulcer Tte Repeat bcx Spine imaging to be arranged and given difficulty laying supine will let oncoming team decide modality/arrangement Mrsa bsi treatment duration depending on further w/u mentioned above Discussed with primary team   I spent 75 minute reviewing data/chart, and coordinating care and >50% direct face to face time providing counseling/discussing diagnostics/treatment plan with patient       ------------------------------------------------ Principal Problem:   SIRS (systemic inflammatory response syndrome) ([place]) Active Problems:   Anxiety and depression   Chronic post-traumatic stress disorder (PTSD)   Hyponatremia   Malnutrition of moderate degree   History of schizophrenia   Normocytic anemia   Chronic multifocal osteomyelitis, left femur (HCC)   Amphetamine abuse (HCC)   Gastroesophageal reflux disease without esophagitis   Tobacco use disorder   Cellulitis of left foot   Thrombocytosis   Unstageable pressure ulcer of left hip ([place])   Sepsis due to undetermined organism ([place])    HPI: Steve [name] is a 31 y.o. male substance abuse, anxiety/depression, schizo, ptsd, quadriplegic, chronic decub left thigh ulcer/femoral om, hx esbl providencia bsi 08/2021, hx mrsa bacteremia 07/2021 (negative for endocarditis), admitted 10/07 with sepsis found to have mrsa bacteremia/septic shock in setting open wound and chronic decub left thigh ulcer  Patient injured bilaterl feet 3 days prior to admission, it remains swollen but not red, there is some skin maceration/abrasion.  He said due to the leg spasm he kicked against hard objects  He has chronic left thigh decub/femoral om and reports increased discharge  He also endorsed bilateral leg spasm [redacted] weeks along with subjective chill and malaise so came to ed for evaluation  He was  BIBA  Ed: Febrile to 101 No leukocytosis He was lethargic at presentation He eventually become hypotensive and required over 4 liters crystalloid and started on pressor, admitted to icu Started on bsAbx vanc/meropenem Cxr clear Left foot ct soft tissue swelling Left thigh surgery evaluating no I&D planned  Today his hemodynamics is better and he is off pressor Bcx returned with mrsa Ucx also obtained and showed ecoli -- susceptibility pending  Family History  Problem Relation Age of Onset   Depression Mother    Bipolar disorder Father     Social History   Tobacco Use   Smoking status: Some Days    Packs/day: 0.25    Types: Cigarettes   Smokeless tobacco: Never  Vaping Use   Vaping Use: Unknown  Substance Use Topics   Alcohol use: Not Currently   Drug use: Yes    Types: Marijuana, Methamphetamines    Allergies  Allergen Reactions   Caffeine Anaphylaxis   Chocolate Anaphylaxis   Tuberculin, Ppd     Other reaction(s): Eruption of skin   Sulfa Antibiotics Rash    Review of Systems: ROS All Other ROS was negative, except mentioned above   Past Medical History:  Diagnosis Date   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/25/2016   Anxiety    Anxiety and depression 11/12/2015   Chronic osteomyelitis, pelvis, left ([place]) 05/30/2021   Paranoid schizophrenia ([place])    Paraplegia (HCC)    PTSD (post-traumatic stress disorder) Paranoid       Scheduled Meds:  ARIPiprazole  15 mg Oral Daily   Chlorhexidine Gluconate Cloth  6 each Topical Daily   Chlorhexidine Gluconate Cloth  6 each Topical Q0600   midodrine  5 mg Oral BID WC   mupirocin ointment  1 Application Nasal BID   pantoprazole  40 mg Oral Daily   pregabalin  150 mg Oral BID   sertraline  25 mg Oral Daily   thiamine (VITAMIN B1) injection  100 mg Intravenous Daily   Continuous Infusions:  sodium chloride 10 mL/hr at 06/24/22 0731   lactated ringers 75 mL/hr at  06/24/22 0731   meropenem (MERREM) IV Stopped (06/24/22 0543)   norepinephrine (LEVOPHED) Adult infusion 3 mcg/min (06/24/22 0731)   vancomycin     PRN Meds:.sodium chloride, acetaminophen **OR** acetaminophen, fentaNYL (SUBLIMAZE) injection, ondansetron **OR** ondansetron (ZOFRAN) IV, mouth rinse, oxyCODONE, tiZANidine, traZODone   OBJECTIVE: Blood pressure (!) 100/57, pulse 77, temperature 99.3 F (37.4 C), resp. rate 14, height 5\' 11"  (1.803 m), weight 72.6 kg, SpO2 94 %.  Physical Exam General/constitutional: no distress, pleasant, slightly somnolent HEENT: Normocephalic, PER, Conj Clear, EOMI, Oropharynx clear Neck supple CV: rrr no mrg Lungs: clear to auscultation, normal respiratory effort Abd: Soft, Nontender Ext: no edema Skin: abrasion bilateral dorsum foot with eschar slight edema L>R foot; reviewed picture of chronic wound left thigh Neuro: nonfocal MSK: no peripheral joint swelling/tenderness/warmth; back spine no obvious tenderness      Lab Results Lab Results  Component Value Date   WBC 5.1 06/24/2022   HGB 7.7 (L) 06/24/2022   HCT 24.5 (L) 06/24/2022   MCV 86.2 06/24/2022   PLT 287 06/24/2022    Lab Results  Component Value Date  CREATININE 0.47 (L) 06/24/2022   BUN 8 06/24/2022   NA 136 06/24/2022   K 3.3 (L) 06/24/2022   CL 107 06/24/2022   CO2 24 06/24/2022    Lab Results  Component Value Date   ALT 8 06/24/2022   AST 6 (L) 06/24/2022   ALKPHOS 76 06/24/2022   BILITOT 0.9 06/24/2022      Microbiology: Recent Results (from the past 240 hour(s))  Blood Culture (routine x 2)     Status: Abnormal (Preliminary result)   Collection Time: 06/23/22  4:23 AM   Specimen: BLOOD  Result Value Ref Range Status   Specimen Description   Final    BLOOD RIGHT ANTECUBITAL Performed at [facility], [address] [place], [place] [address]    Special Requests   Final    BOTTLES DRAWN AEROBIC AND ANAEROBIC Blood Culture results  may not be optimal due to an excessive volume of blood received in culture bottles Performed at [facility], [address], [place], [place] [address]    Culture  Setup Time   Final    AEROBIC BOTTLE ONLY GRAM POSITIVE COCCI IN CLUSTERS CRITICAL RESULT CALLED TO, READ BACK BY AND VERIFIED WITH:  C/ PHARMD E. JACKSON 06/23/22 2250 A. LAFRANCE     Culture (A)  Final    STAPHYLOCOCCUS AUREUS SUSCEPTIBILITIES TO FOLLOW Performed at [facility], [address], [place], [place] [address]    Report Status PENDING  Incomplete  Urine Culture     Status: Abnormal (Preliminary result)   Collection Time: 06/23/22  4:23 AM   Specimen: In/Out Cath Urine  Result Value Ref Range Status   Specimen Description   Final    IN/OUT CATH URINE Performed at [facility], [address] [place], [place] [address]    Special Requests   Final    Normal Performed at [facility], [address] [place], [place] [address]    Culture (A)  Final    >=100,000 COLONIES/mL ESCHERICHIA COLI SUSCEPTIBILITIES TO FOLLOW Performed at [facility], [address] [place], [place] [address]    Report Status PENDING  Incomplete  Blood Culture ID Panel (Reflexed)     Status: Abnormal   Collection Time: 06/23/22  4:23 AM  Result Value Ref Range Status   Enterococcus faecalis NOT DETECTED NOT DETECTED Final   Enterococcus Faecium NOT DETECTED NOT DETECTED Final   Listeria monocytogenes NOT DETECTED NOT DETECTED Final   Staphylococcus species DETECTED (A) NOT DETECTED Final    Comment: CRITICAL RESULT CALLED TO, READ BACK BY AND VERIFIED WITH:  C/ PHARMD E. JACKSON 06/23/22 2250 A. LAFRANCE     Staphylococcus aureus (BCID) DETECTED (A) NOT DETECTED Final    Comment: Methicillin (oxacillin)-resistant Staphylococcus aureus (MRSA). MRSA is predictably resistant to beta-lactam antibiotics (except ceftaroline). Preferred therapy is vancomycin  unless clinically contraindicated. Patient requires contact precautions if  hospitalized. CRITICAL RESULT CALLED TO, READ BACK BY AND VERIFIED WITH:  C/ PHARMD E. JACKSON 06/23/22 2250 A. LAFRANCE     Staphylococcus epidermidis NOT DETECTED NOT DETECTED Final   Staphylococcus lugdunensis NOT DETECTED NOT DETECTED Final   Streptococcus species NOT DETECTED NOT DETECTED Final   Streptococcus agalactiae NOT DETECTED NOT DETECTED Final   Streptococcus pneumoniae NOT DETECTED NOT DETECTED Final   Streptococcus pyogenes NOT DETECTED NOT DETECTED Final   A.calcoaceticus-baumannii NOT DETECTED NOT DETECTED Final   Bacteroides fragilis NOT DETECTED NOT DETECTED Final  Enterobacterales NOT DETECTED NOT DETECTED Final   Enterobacter cloacae complex NOT DETECTED NOT DETECTED Final   Escherichia coli NOT DETECTED NOT DETECTED Final   Klebsiella aerogenes NOT DETECTED NOT DETECTED Final   Klebsiella oxytoca NOT DETECTED NOT DETECTED Final   Klebsiella pneumoniae NOT DETECTED NOT DETECTED Final   Proteus species NOT DETECTED NOT DETECTED Final   Salmonella species NOT DETECTED NOT DETECTED Final   Serratia marcescens NOT DETECTED NOT DETECTED Final   Haemophilus influenzae NOT DETECTED NOT DETECTED Final   Neisseria meningitidis NOT DETECTED NOT DETECTED Final   Pseudomonas aeruginosa NOT DETECTED NOT DETECTED Final   Stenotrophomonas maltophilia NOT DETECTED NOT DETECTED Final   Candida albicans NOT DETECTED NOT DETECTED Final   Candida auris NOT DETECTED NOT DETECTED Final   Candida glabrata NOT DETECTED NOT DETECTED Final   Candida krusei NOT DETECTED NOT DETECTED Final   Candida parapsilosis NOT DETECTED NOT DETECTED Final   Candida tropicalis NOT DETECTED NOT DETECTED Final   Cryptococcus neoformans/gattii NOT DETECTED NOT DETECTED Final   Meth resistant mecA/C and MREJ DETECTED (A) NOT DETECTED Final    Comment: CRITICAL RESULT CALLED TO, READ BACK BY AND VERIFIED WITH:  C/ PHARMD E.  JACKSON 06/23/22 2250 A. LAFRANCE  Performed at [facility], [place] [address], [place], [place] [address]   Blood Culture (routine x 2)     Status: None (Preliminary result)   Collection Time: 06/23/22  4:28 AM   Specimen: BLOOD  Result Value Ref Range Status   Specimen Description   Final    BLOOD BLOOD RIGHT HAND Performed at [facility] [address], [place], [place] [address]    Special Requests   Final    BOTTLES DRAWN AEROBIC ONLY Blood Culture adequate volume Performed at [facility] [address], [place], [place] [address]    Culture   Final    NO GROWTH 1 DAY Performed at [facility], [place] [address] [place], [place] [address]    Report Status PENDING  Incomplete  Resp Panel by RT-PCR (Flu A&B, Covid) Anterior Nasal Swab     Status: None   Collection Time: 06/23/22  5:01 AM   Specimen: Anterior Nasal Swab  Result Value Ref Range Status   SARS Coronavirus 2 by RT PCR NEGATIVE NEGATIVE Final    Comment: (NOTE) SARS-CoV-2 target nucleic acids are NOT DETECTED.  The SARS-CoV-2 RNA is generally detectable in upper respiratory specimens during the acute phase of infection. The lowest concentration of SARS-CoV-2 viral copies this assay can detect is 138 copies/mL. A negative result does not preclude SARS-Cov-2 infection and should not be used as the sole basis for treatment or other patient management decisions. A negative result may occur with  improper specimen collection/handling, submission of specimen other than nasopharyngeal swab, presence of viral mutation(s) within the areas targeted by this assay, and inadequate number of viral copies(<138 copies/mL). A negative result must be combined with clinical observations, patient history, and epidemiological information. The expected result is Negative.  Fact Sheet for Patients:  EntrepreneurPulse.com.au  Fact Sheet for Healthcare Providers:   IncredibleEmployment.be  This test is no t yet approved or cleared by the [place] FDA and  has been authorized for detection and/or diagnosis of SARS-CoV-2 by FDA under an Emergency Use Authorization (EUA). This EUA will remain  in effect (meaning this test can be used) for the duration of the COVID-19 declaration under Section 564(b)(1) of the Act,  21 U.S.C.section 360bbb-3(b)(1), unless the authorization is terminated  or revoked sooner.       Influenza A by PCR NEGATIVE NEGATIVE Final   Influenza B by PCR NEGATIVE NEGATIVE Final    Comment: (NOTE) The Xpert Xpress SARS-CoV-2/FLU/RSV plus assay is intended as an aid in the diagnosis of influenza from Nasopharyngeal swab specimens and should not be used as a sole basis for treatment. Nasal washings and aspirates are unacceptable for Xpert Xpress SARS-CoV-2/FLU/RSV testing.  Fact Sheet for Patients: EntrepreneurPulse.com.au  Fact Sheet for Healthcare Providers: IncredibleEmployment.be  This test is not yet approved or cleared by the [place] FDA and has been authorized for detection and/or diagnosis of SARS-CoV-2 by FDA under an Emergency Use Authorization (EUA). This EUA will remain in effect (meaning this test can be used) for the duration of the COVID-19 declaration under Section 564(b)(1) of the Act, 21 U.S.C. section 360bbb-3(b)(1), unless the authorization is terminated or revoked.  Performed at [facility], [place] [address] [place], [place] [address]   MRSA Next Gen by PCR, Nasal     Status: Abnormal   Collection Time: 06/23/22  5:53 PM   Specimen: Nasal Mucosa; Nasal Swab  Result Value Ref Range Status   MRSA by PCR Next Gen DETECTED (A) NOT DETECTED Final    Comment: CRITICAL RESULT CALLED TO, READ BACK BY AND VERIFIED WITH: BLOCK,D. 06/23/22 @1908  BY SEEL,M. (NOTE) The GeneXpert MRSA Assay (FDA approved for NASAL specimens  only), is one component of a comprehensive MRSA colonization surveillance program. It is not intended to diagnose MRSA infection nor to guide or monitor treatment for MRSA infections. Test performance is not FDA approved in patients less than 22 years old. Performed at [facility], [place] [address] [place], [place] [address]      Serology:    Imaging: If present, new imagings (plain films, ct scans, and mri) have been personally visualized and interpreted; radiology reports have been reviewed. Decision making incorporated into the Impression / Recommendations.  10/8 ct left foot 1. Diffuse edematous changes throughout the subcutaneous tissues of the left foot and ankle without focal fluid collection, foreign body or soft tissue emphysema. 2. No evidence of osteomyelitis or septic arthritis. 3. The bones appear demineralized.  10/7 cxr No acute abnormality    Jabier Mutton, MD [facility] for Infectious [place] [phone] pager    06/24/2022, 12:41 PM

## 2022-06-24 NOTE — Assessment & Plan Note (Signed)
Aripiprazole 50 mg daily Sertraline 25 mg daily Trazodone 50 mg as needed bedtime

## 2022-06-24 NOTE — Assessment & Plan Note (Signed)
CT of the foot shows just edema no evidence of osteomyelitis Continue antibiotics as above

## 2022-06-24 NOTE — Consult Note (Signed)
[facility] Nurse Consult Note: Reason for Consult:Chronic, nonhealing pressure injuries. Complex medical history as noted by EDP and Critical Care. Photos provided by the EDP. Assistance of Bedside RN Kathleen Argue is appreciated. Surgery is simultaneously consulted and Dr. Harlow Asa has just seen this morning. Patient is well known to the [facility] nursing team from multiple previous admissions. Seen by this writer last month for these wounds on 05/19/22. Wound type:Pressure, infection Pressure Injury POA: Yes Measurement:Total area encompassing two wounds of sacrum and left buttock is 11cm x 15cm per Bedside RN.  Wound bed: Sacral and left buttock: Per photos and with red, yellow fibrinous and small amounts of black necrotic tissue in wound beds, moist with large amount serous exudate and periwound maceration. LEs with dried serum to feet, right knee with yellow fibrinous material obscuring wound bed and minimal exudate.. Drainage (amount, consistency, odor) As noted above Periwound: LEs and feet with dry, maceration to buttock and sacrum. Dressing procedure/placement/frequency: Patient is on a mattress replacement as he is in the ICU. I will continue that therapy for pressure redistribution and management of microclimate upon transfer to floor. Bilateral pressure redistribution heel boots are provided. Topical care to the LE wounds (the right lateral knee, bilateral feet) will be to wash with soap and water, rinse and cover with folded layers of xeroform gauze Kellie Simmering # 294), dry gauze and secure with either tape or (particularly for the foot wounds) wrap with Kerlix roll gauze and secure with paper tape. Turn and reposition to minimize time in the supine position.  [facility] nursing team will not follow, but will remain available to this patient, the nursing, surgical and medical teams.  Please re-consult if needed.  Thank you for inviting [place] to participate in this patient's Plan of Care.  Maudie Flakes, MSN, RN, CNS, [place],  Serita Grammes, [employer], [employer] phone:  [phone]

## 2022-06-24 NOTE — Hospital Course (Signed)
Patient has a past medical history significant for paraplegia, longstanding psychiatric diagnoses to include paranoid schizophrenia, and multisubstance use disorder who presents to the hospital with complaints of lower abdominal pain and lower extremity pain.  He reported fever and chills at home.  He additionally reports an injury to his left foot approximately 3 days prior to admission with some erythema and swelling and tenderness.  He has chronic unstageable decubitus ulcer on the left thigh that has been having more drainage than usual. Patient presented with SIRS and sepsis.  He was given IV fluid hydration as well as broad-spectrum IV antibiotics. Infectious disease and general surgery were consulted as well as wound care. Blood cultures are positive for Staphylococcus aureus.  He is MRSA positive

## 2022-06-24 NOTE — Progress Notes (Addendum)
NAME:  Steve [name], MRN:  [id number], DOB:  1991-03-29, LOS: 1 ADMISSION DATE:  06/23/2022, CONSULTATION DATE:  06/23/22 REFERRING MD:  Olevia Bowens, CHIEF COMPLAINT:  leg pain   History of Present Illness:  31yM with history of anxiety, depression, schizophrenia, paraplegia - does in/out cath at home, PTSD who was BIBEMS for worsening leg spasms over last couple of weeks. Much of history is obtained through chart review as he is relatively lethargic at time of my interview. He has had chills, night sweats, thinks he's had fever over similar time frame. He also says he injured his left foot about 3 days PTA and it's now become erythematous, warm and tender.   In ED he was initially normotensive but later febrile, hypotensive (though with somewhat variable pressures when cuff site switched arm to arm), lethargic (though had also been given low dose ativan/fentanyl in attempt to facilitate imaging  due to his anxiety/agitation, in addition to robaxin as well as home abilify, lyrica), concern for sepsis due to acute on chronic osteomyelitis of unstageable left thigh pressure wound, SSTI of LLE, and/or UTI.  He was started on vanc/cefepime and given 4250cc crystalloid, then started on low dose levophed peripherally.   Pertinent  Medical History  Anxiety Depression PTSD Paraplegia Schizophrenia Anemia  Significant Hospital Events: Including procedures, antibiotic start and stop dates in addition to other pertinent events   10/7 admitted, sepsis bolus +, low dose levo, started on vanc/meropenem  Interim History / Subjective:   Transfused 2U pRBC as Hb drifted following 4.5L fluid resuscitation, remains on low dose levophed.  Objective   Blood pressure 132/68, pulse 70, temperature 99.3 F (37.4 C), resp. rate 16, height 5\' 11"  (1.803 m), weight 72.6 kg, SpO2 96 %.        Intake/Output Summary (Last 24 hours) at 06/24/2022 0737 Last data filed at 06/24/2022 0731 Gross per 24 hour  Intake  9717.61 ml  Output 1150 ml  Net 8567.61 ml   Filed Weights   06/23/22 0420  Weight: 72.6 kg    Examination: General appearance: 31 y.o., male, NAD, drowsy Eyes: tracking appropriately Neck: healed prior stoma\; no lymphadenopathy, no JVD Lungs: CTAB, no crackles, no wheeze, with normal respiratory effort CV: RRR, no murmur  Abdomen: Soft, non-tender; non-distended, BS present  Extremities: No peripheral edema, warm, contractures and unstageable L thigh wound noted, LLE warm/erythematous and a little more edematous/taut than R foot Neuro: Alert and oriented to person and place, no focal deficit   Labs/imaging reviewed: CK WNL Albumin 2 Hb 6.7  BMP stable  No new imaging   Resolved Hospital Problem list    Assessment & Plan:   # Hypotension # Severe sepsis with acute metabolic encephalopathy, oliguria Doesn't appear to still be hypovolemic but looks like he'd still be fluid tolerant. Developing septic shock vs multiple sedating medications vs less likely hemorrhagic shock given Hb drop (bedside US with full IVC, Hb could be dilutional or poor sample). Sources of sepsis potentially include acute on chronic OM, SSTI LLE, UTI. - trend cbc - vanc/merrem given relatively recent history of ESBL follow cultures and narrow as able - f/u CT A/P, LLE - WOCN assessment of chronic wounds - surgery contacted in ED though he was resistant to imaging at that time - wean levo for MAP 65, if can't wean off this morning give albumin 5% 25g and reassess  # Acute toxic metabolic encephalopathy May be due to sepsis/hypotension vs multiple sedating medications vs possibility of recent  methamphetamine or other substance use.  - gradually resume home lyrica, antispasmodics - f/u response to resuscitative measures above  # Anemia of chronic disease # Acute blood loss anemia Dilutional plus minor extent of bleeding in chronic wounds - trend cbc  # Schizophrenia # Mood disturbances - home  abilify  # Moderate protein calorie malnutrition - thiamine   Best Practice (right click and "Reselect all SmartList Selections" daily)   Diet/type: Regular consistency (see orders) DVT prophylaxis: SCD GI prophylaxis: PPI Lines: N/A Foley:  Yes, and it is still needed Code Status:  full code Last date of multidisciplinary goals of care discussion [will attempt to contact pt's wife]  Critical care time: 36 minutes

## 2022-06-24 NOTE — Assessment & Plan Note (Signed)
Resolved

## 2022-06-24 NOTE — Assessment & Plan Note (Addendum)
Marked laboratory abnormalities including abnormal INR and D-dimer. Marked hypotension and requiring blood pressure support.

## 2022-06-24 NOTE — Progress Notes (Signed)
Progress Note   Patient: Steve [name] DOB: October 20, 1990 DOA: 06/23/2022     1 DOS: the patient was seen and examined on 06/24/2022   Brief hospital course: Patient has a past medical history significant for paraplegia, longstanding psychiatric diagnoses to include paranoid schizophrenia, and multisubstance use disorder who presents to the hospital with complaints of lower abdominal pain and lower extremity pain.  He reported fever and chills at home.  He additionally reports an injury to his left foot approximately 3 days prior to admission with some erythema and swelling and tenderness.  He has chronic unstageable decubitus ulcer on the left thigh that has been having more drainage than usual. Patient presented with SIRS and sepsis.  He was given IV fluid hydration as well as broad-spectrum IV antibiotics. Infectious disease and general surgery were consulted as well as wound care. Blood cultures are positive for Staphylococcus aureus.  He is MRSA positive  Assessment and Plan: * SIRS (systemic inflammatory response syndrome) (HCC) Marked laboratory abnormalities including abnormal INR and D-dimer. Marked hypotension and requiring blood pressure support.  Amphetamine and psychostimulant-induced psychosis with hallucinations ([place]) Long-term TOC referral  Hyponatremia Previously 133, today 136 Resolved Trend  Normocytic anemia Hemoglobin is 7.7 up from 6.7 status post 1 unit of packed red blood cells Anemia work-up Trend  Staphylococcus aureus bacteremia Appreciate ID input likely related to his stage IV decubitus ulcer. Antibiotics as above.  Sepsis due to undetermined organism [facility]) Due to Staphylococcus Antibiotics as above  Unstageable pressure ulcer of left hip (HCC) Likely source of infection, see osteomyelitis assessment and plan.  Thrombocytosis Resolved  Cellulitis of left foot CT of the foot shows just edema no evidence of osteomyelitis Continue  antibiotics as above  Tobacco use disorder Smoking cessation Patient was offered and declined nicotine replacement.  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 Blood cultures reveal Staph aureus Analgesics as needed Wound care has been consulted General surgery is awaiting imaging Infectious disease consult has been done, await recommendations.  History of schizophrenia Aripiprazole 50 mg daily Sertraline 25 mg daily Trazodone 50 mg as needed bedtime  Malnutrition of moderate degree Protein supplementation Consider dietitian evaluation     Subjective: Patient reports feeling improved today.  He did receive 1 unit of blood overnight.  He continues to be on blood pressure support.  He is agreeable to imaging this morning.  Physical Exam: Vitals:   06/24/22 0745 06/24/22 0800 06/24/22 0815 06/24/22 0900  BP: 125/68 (!) 102/47 (!) 113/56 (!) 114/56  Pulse: 71 79 82 73  Resp: 16 19 19 18   Temp: 99.7 F (37.6 C) 99.7 F (37.6 C) 99.7 F (37.6 C) 99.3 F (37.4 C)  TempSrc:      SpO2: 97% 97% 97% 97%  Weight:      Height:       Physical Examination: General appearance - alert, well appearing, and in no distress and oriented to person, place, and time Neck - supple, no significant adenopathy Chest - clear to auscultation, no wheezes, rales or rhonchi, symmetric air entry Heart - normal rate, regular rhythm, normal S1, S2, no murmurs, rubs, clicks or gallops Abdomen - soft, nontender, nondistended, no masses or organomegaly Extremities -contractures present.  There is swelling of the lower extremity.  Did not examine his decubitus today.  Data Reviewed: Results for orders placed or performed during the hospital  encounter of 06/23/22 (from the past 24 hour(s))  Magnesium     Status: None   Collection Time:  06/23/22  4:04 PM  Result Value Ref Range   Magnesium 1.7 1.7 - 2.4 mg/dL  Phosphorus     Status: None   Collection Time: 06/23/22  4:04 PM  Result Value Ref Range   Phosphorus 3.8 2.5 - 4.6 mg/dL  Lactic acid, plasma     Status: None   Collection Time: 06/23/22  4:04 PM  Result Value Ref Range   Lactic Acid, Venous 1.3 0.5 - 1.9 mmol/L  Basic metabolic panel     Status: Abnormal   Collection Time: 06/23/22  4:04 PM  Result Value Ref Range   Sodium 134 (L) 135 - 145 mmol/L   Potassium 3.5 3.5 - 5.1 mmol/L   Chloride 105 98 - 111 mmol/L   CO2 23 22 - 32 mmol/L   Glucose, Bld 98 70 - 99 mg/dL   BUN 11 6 - 20 mg/dL   Creatinine, Ser 0.67 0.61 - 1.24 mg/dL   Calcium 7.8 (L) 8.9 - 10.3 mg/dL   GFR, Estimated >60 >60 mL/min   Anion gap 6 5 - 15  CBC     Status: Abnormal   Collection Time: 06/23/22  4:04 PM  Result Value Ref Range   WBC 5.8 4.0 - 10.5 K/uL   RBC 2.22 (L) 4.22 - 5.81 MIL/uL   Hemoglobin 5.7 (LL) 13.0 - 17.0 g/dL   HCT 18.8 (L) 39.0 - 52.0 %   MCV 84.7 80.0 - 100.0 fL   MCH 25.7 (L) 26.0 - 34.0 pg   MCHC 30.3 30.0 - 36.0 g/dL   RDW 16.8 (H) 11.5 - 15.5 %   Platelets 268 150 - 400 K/uL   nRBC 0.0 0.0 - 0.2 %  CBC     Status: Abnormal   Collection Time: 06/23/22  5:28 PM  Result Value Ref Range   WBC 6.7 4.0 - 10.5 K/uL   RBC 2.47 (L) 4.22 - 5.81 MIL/uL   Hemoglobin 6.3 (LL) 13.0 - 17.0 g/dL   HCT 21.2 (L) 39.0 - 52.0 %   MCV 85.8 80.0 - 100.0 fL   MCH 25.5 (L) 26.0 - 34.0 pg   MCHC 29.7 (L) 30.0 - 36.0 g/dL   RDW 16.8 (H) 11.5 - 15.5 %   Platelets 352 150 - 400 K/uL   nRBC 0.0 0.0 - 0.2 %  Blood gas, venous     Status: Abnormal   Collection Time: 06/23/22  5:36 PM  Result Value Ref Range   pH, Ven 7.4 7.25 - 7.43   pCO2, Ven 40 (L) 44 - 60 mmHg   pO2, Ven 53 (H) 32 - 45 mmHg   Bicarbonate 24.7 20.0 - 28.0 mmol/L   Acid-Base Excess 0.1 0.0 - 2.0 mmol/L   O2 Saturation 84.3 %   Patient temperature 37.7   Ammonia     Status: None   Collection Time:  06/23/22  5:36 PM  Result Value Ref Range   Ammonia 21 9 - 35 umol/L  Lactic acid, plasma     Status: None   Collection Time: 06/23/22  5:36 PM  Result Value Ref Range   Lactic Acid, Venous 0.7 0.5 - 1.9 mmol/L  Type and screen [place]     Status: None (Preliminary result)   Collection Time: 06/23/22  5:36 PM  Result Value Ref Range   ABO/RH(D) A POS    Antibody  Screen NEG    Sample Expiration      06/26/2022,2359 Performed at [facility], [place] [address], [place], [place] [address]    Unit Number [id number]    Blood Component Type RED CELLS,LR    Unit division 00    Status of Unit ISSUED    Transfusion Status OK TO TRANSFUSE    Crossmatch Result Compatible    Unit Number [id number]    Blood Component Type RBC LR PHER2    Unit division 00    Status of Unit ISSUED    Transfusion Status OK TO TRANSFUSE    Crossmatch Result Compatible    Unit Number [id number]    Blood Component Type RED CELLS,LR    Unit division 00    Status of Unit ALLOCATED    Transfusion Status OK TO TRANSFUSE    Crossmatch Result Compatible   CK     Status: Abnormal   Collection Time: 06/23/22  5:36 PM  Result Value Ref Range   Total CK 26 (L) 49 - 397 U/L  Prepare RBC (crossmatch)     Status: None   Collection Time: 06/23/22  5:36 PM  Result Value Ref Range   Order Confirmation      ORDER PROCESSED BY BLOOD BANK Performed at [facility], [place] [address] [place], [place] [address]   DIC Panel ONCE - STAT     Status: Abnormal   Collection Time: 06/23/22  5:36 PM  Result Value Ref Range   Prothrombin Time 15.8 (H) 11.4 - 15.2 seconds   INR 1.3 (H) 0.8 - 1.2   aPTT 42 (H) 24 - 36 seconds   Fibrinogen 742 (H) 210 - 475 mg/dL   D-Dimer, Quant 1.80 (H) 0.00 - 0.50 ug/mL-FEU   Platelets 311 150 - 400 K/uL   Smear Review NO SCHISTOCYTES SEEN   MRSA Next Gen by PCR, Nasal     Status: Abnormal   Collection Time: 06/23/22  5:53 PM    Specimen: Nasal Mucosa; Nasal Swab  Result Value Ref Range   MRSA by PCR Next Gen DETECTED (A) NOT DETECTED  HIV Antibody (routine testing w rflx)     Status: None   Collection Time: 06/24/22 12:44 AM  Result Value Ref Range   HIV Screen 4th Generation wRfx Non Reactive Non Reactive  CBC     Status: Abnormal   Collection Time: 06/24/22 12:44 AM  Result Value Ref Range   WBC 5.1 4.0 - 10.5 K/uL   RBC 2.61 (L) 4.22 - 5.81 MIL/uL   Hemoglobin 6.7 (LL) 13.0 - 17.0 g/dL   HCT 22.5 (L) 39.0 - 52.0 %   MCV 86.2 80.0 - 100.0 fL   MCH 25.7 (L) 26.0 - 34.0 pg   MCHC 29.8 (L) 30.0 - 36.0 g/dL   RDW 16.5 (H) 11.5 - 15.5 %   Platelets 287 150 - 400 K/uL   nRBC 0.0 0.0 - 0.2 %  Comprehensive metabolic panel     Status: Abnormal   Collection Time: 06/24/22 12:44 AM  Result Value Ref Range   Sodium 136 135 - 145 mmol/L   Potassium 3.3 (L) 3.5 - 5.1 mmol/L   Chloride 107 98 - 111 mmol/L   CO2 24 22 - 32 mmol/L   Glucose, Bld 88 70 - 99 mg/dL   BUN 8 6 - 20 mg/dL   Creatinine, Ser 0.47 (L) 0.61 - 1.24 mg/dL   Calcium 7.8 (L) 8.9 - 10.3 mg/dL  Total Protein 6.4 (L) 6.5 - 8.1 g/dL   Albumin 2.0 (L) 3.5 - 5.0 g/dL   AST 6 (L) 15 - 41 U/L   ALT 8 0 - 44 U/L   Alkaline Phosphatase 76 38 - 126 U/L   Total Bilirubin 0.9 0.3 - 1.2 mg/dL   GFR, Estimated >60 >60 mL/min   Anion gap 5 5 - 15  Prepare RBC (crossmatch)     Status: None   Collection Time: 06/24/22  2:22 AM  Result Value Ref Range   Order Confirmation      ORDER PROCESSED BY BLOOD BANK Performed at [facility] [address] [place], [place] [address]   Glucose, capillary     Status: Abnormal   Collection Time: 06/24/22  8:16 AM  Result Value Ref Range   Glucose-Capillary 103 (H) 70 - 99 mg/dL  Hemoglobin and hematocrit, blood     Status: Abnormal   Collection Time: 06/24/22  8:32 AM  Result Value Ref Range   Hemoglobin 7.7 (L) 13.0 - 17.0 g/dL   HCT 24.5 (L) 39.0 - 52.0 %    Family Communication:  Patient at bedside DVT prophylaxis: Lovenox Disposition: Status is: Inpatient Remains inpatient appropriate because: Positive blood cultures and need for IV antibiotics.  Need for ongoing assessment of wound and wound care.  He is on blood pressure support.  Planned Discharge Destination:  I think this is unclear he has been at home doing home care and is obviously not doing well.  We will need further assessment as he continues to improve but may need SNF.  Time spent: 45 minutes   Author: Donnamae Jude, MD 06/24/2022 11:12 AM  For on call review [email or web address].

## 2022-06-24 NOTE — Progress Notes (Signed)
06/23/2022  Based on repeat CBC with Hgb of 6.7, will proceed with transfusion per orders as discussed with e-Link RN and confirmed by MD. Per MD, only one unit to be transfused, followed by CBC 2 hours post-transfusion.  Due to patient's altered mental status, attempted to contact family members for verbal consent to administer blood transfusion. Attempted to contact the following individuals/phone numbers:   [phone] (Home and Mobile) - per demographics, patient is married. This number goes to a voice mail, appears to be patient's own phone. [phone] (Home Phone) - same as mobile number listed for patient's mother, see below.  Antoney, Biven (Mother)  [phone] (home) - [employer] message indicating that call cannot be completed as dialed. [phone] (Mobile) - left voice mail message for mother to contact unit at [phone] regarding her son.  With all options for consent exhausted, contact e-Link MD Dr. Oletta Darter regarding above. Given emergency consent to proceed with transfusion.  Cindy S. Brigitte Pulse BSN, RN, [facility] 06/24/2022  06/24/2022 0200 Patient previously awake and alert and explanation was given regarding need for blood transfusion as well as attempts to contact family members for consent. Patient stated he is married but is unable to provide his wife's phone number. Medication was given for pain.   Additional unit of blood ordered, however, patient is unable to sign consent form at this time due to somnolence and recent pain med; transfusion given per MD instructions.  Cindy S. Brigitte Pulse BSN, RN, [place] 06/24/2022 4:50 AM

## 2022-06-25 ENCOUNTER — Inpatient Hospital Stay (HOSPITAL_COMMUNITY): Payer: No Typology Code available for payment source

## 2022-06-25 DIAGNOSIS — R6510 Systemic inflammatory response syndrome (SIRS) of non-infectious origin without acute organ dysfunction: Secondary | ICD-10-CM | POA: Diagnosis not present

## 2022-06-25 DIAGNOSIS — A419 Sepsis, unspecified organism: Secondary | ICD-10-CM | POA: Diagnosis not present

## 2022-06-25 DIAGNOSIS — R6521 Severe sepsis with septic shock: Secondary | ICD-10-CM

## 2022-06-25 DIAGNOSIS — R7881 Bacteremia: Secondary | ICD-10-CM

## 2022-06-25 LAB — CBC WITH DIFFERENTIAL/PLATELET
Abs Immature Granulocytes: 0.04 10*3/uL (ref 0.00–0.07)
Basophils Absolute: 0 10*3/uL (ref 0.0–0.1)
Basophils Relative: 0 %
Eosinophils Absolute: 0.4 10*3/uL (ref 0.0–0.5)
Eosinophils Relative: 8 %
HCT: 25.6 % — ABNORMAL LOW (ref 39.0–52.0)
Hemoglobin: 7.9 g/dL — ABNORMAL LOW (ref 13.0–17.0)
Immature Granulocytes: 1 %
Lymphocytes Relative: 28 %
Lymphs Abs: 1.3 10*3/uL (ref 0.7–4.0)
MCH: 26.4 pg (ref 26.0–34.0)
MCHC: 30.9 g/dL (ref 30.0–36.0)
MCV: 85.6 fL (ref 80.0–100.0)
Monocytes Absolute: 0.4 10*3/uL (ref 0.1–1.0)
Monocytes Relative: 8 %
Neutro Abs: 2.5 10*3/uL (ref 1.7–7.7)
Neutrophils Relative %: 55 %
Platelets: 347 10*3/uL (ref 150–400)
RBC: 2.99 MIL/uL — ABNORMAL LOW (ref 4.22–5.81)
RDW: 16.3 % — ABNORMAL HIGH (ref 11.5–15.5)
WBC: 4.6 10*3/uL (ref 4.0–10.5)
nRBC: 0 % (ref 0.0–0.2)

## 2022-06-25 LAB — ECHOCARDIOGRAM COMPLETE
AR max vel: 3.98 cm2
AV Peak grad: 7.2 mmHg
Ao pk vel: 1.35 m/s
Area-P 1/2: 3.68 cm2
Height: 71 in
S' Lateral: 3.1 cm
Weight: 2560 oz

## 2022-06-25 LAB — URINE CULTURE
Culture: >=100000 — AB
Special Requests: NORMAL

## 2022-06-25 LAB — COMPREHENSIVE METABOLIC PANEL
ALT: 8 U/L (ref 0–44)
AST: 8 U/L — ABNORMAL LOW (ref 15–41)
Albumin: 2 g/dL — ABNORMAL LOW (ref 3.5–5.0)
Alkaline Phosphatase: 70 U/L (ref 38–126)
Anion gap: 5 (ref 5–15)
BUN: 6 mg/dL (ref 6–20)
CO2: 26 mmol/L (ref 22–32)
Calcium: 7.7 mg/dL — ABNORMAL LOW (ref 8.9–10.3)
Chloride: 106 mmol/L (ref 98–111)
Creatinine, Ser: 0.47 mg/dL — ABNORMAL LOW (ref 0.61–1.24)
GFR, Estimated: >60 mL/min (ref >60)
Glucose, Bld: 100 mg/dL — ABNORMAL HIGH (ref 70–99)
Potassium: 4 mmol/L (ref 3.5–5.1)
Sodium: 137 mmol/L (ref 135–145)
Total Bilirubin: 0.4 mg/dL (ref 0.3–1.2)
Total Protein: 6.1 g/dL — ABNORMAL LOW (ref 6.5–8.1)

## 2022-06-25 LAB — CULTURE, BLOOD (ROUTINE X 2)

## 2022-06-25 LAB — MAGNESIUM: Magnesium: 1.8 mg/dL (ref 1.7–2.4)

## 2022-06-25 MED ORDER — PROSOURCE PLUS PO LIQD
30.0000 mL | Freq: Every day | ORAL | Status: DC
  Administered 2022-06-26 – 2022-07-01 (×5): 30 mL via ORAL
  Filled 2022-06-25 (×5): qty 30

## 2022-06-25 MED ORDER — LACTATED RINGERS IV BOLUS
500.0000 mL | Freq: Once | INTRAVENOUS | Status: AC
  Administered 2022-06-25: 500 mL via INTRAVENOUS

## 2022-06-25 MED ORDER — ENSURE ENLIVE PO LIQD
237.0000 mL | Freq: Two times a day (BID) | ORAL | Status: DC
  Administered 2022-06-25 – 2022-07-01 (×8): 237 mL via ORAL

## 2022-06-25 MED ORDER — POTASSIUM CHLORIDE CRYS ER 20 MEQ PO TBCR
40.0000 meq | EXTENDED_RELEASE_TABLET | Freq: Once | ORAL | Status: AC
  Administered 2022-06-25: 40 meq via ORAL
  Filled 2022-06-25: qty 2

## 2022-06-25 MED ORDER — MAGNESIUM SULFATE 2 GM/50ML IV SOLN
2.0000 g | Freq: Once | INTRAVENOUS | Status: AC
  Administered 2022-06-25: 2 g via INTRAVENOUS
  Filled 2022-06-25: qty 50

## 2022-06-25 MED ORDER — SENNOSIDES-DOCUSATE SODIUM 8.6-50 MG PO TABS
1.0000 | ORAL_TABLET | Freq: Two times a day (BID) | ORAL | Status: DC
  Administered 2022-06-25 – 2022-07-01 (×8): 1 via ORAL
  Filled 2022-06-25 (×11): qty 1

## 2022-06-25 MED ORDER — DEXTROSE IN LACTATED RINGERS 5 % IV SOLN: INTRAVENOUS | Status: DC

## 2022-06-25 NOTE — Progress Notes (Signed)
[facility] Progress Note Patient Name: Steve [name] DOB: 01-Aug-1991 MRN: [id number]   Date of Service  06/25/2022  HPI/Events of Note  Notified of constipation.  Pt has not had a bowel movement in a week and receiving narcotic medications.   eICU Interventions  Bowel regimen with docusate-senna ordered BID.     Intervention Category Intermediate Interventions: Other:  Elsie Lincoln 06/25/2022, 3:40 AM

## 2022-06-25 NOTE — Progress Notes (Signed)
PROGRESS NOTE  Steve [name]:[id number] DOB: 1991-08-17 DOA: 06/23/2022 PCP: [facility]   LOS: 2 days   Brief Narrative / Interim history: Patient has a past medical history significant for paraplegia, longstanding psychiatric diagnoses to include paranoid schizophrenia, and multisubstance use disorder who presents to the hospital with complaints of lower abdominal pain and lower extremity pain.  He reported fever and chills at home.  He additionally reports an injury to his left foot approximately 3 days prior to admission with some erythema and swelling and tenderness.  He has chronic unstageable decubitus ulcer on the left thigh that has been having more drainage than usual.  He was found to have septic shock due to Staph aureus bacteremia.  ID, general surgery, PCCM consulted  Subjective / 24h Interval events: Complains of neck pain which he tells me is chronic.  Assesement and Plan: Principal Problem:   SIRS (systemic inflammatory response syndrome) (HCC) Active Problems:   Amphetamine and psychostimulant-induced psychosis with hallucinations (HCC)   Hyponatremia   Normocytic anemia   Anxiety and depression   Chronic post-traumatic stress disorder (PTSD)   Malnutrition of moderate degree   History of schizophrenia   Chronic multifocal osteomyelitis, left femur (HCC)   Amphetamine abuse (HCC)   Gastroesophageal reflux disease without esophagitis   Tobacco use disorder   Cellulitis of left foot   Thrombocytosis   Unstageable pressure ulcer of left hip (HCC)   Sepsis due to undetermined organism (HCC)   MRSA bacteremia   Acute cystitis without hematuria   Cellulitis   Quadriplegia (HCC)   Principal problem Septic shock due to MRSA bacteremia-blood cultures obtained on 7/7 shows MRSA.  ID consulted, following.  He has been placed on vancomycin, continue for now.  Still requiring low-dose pressors, continue and wean off as tolerated. -Source may be his  chronic decubitus ulcers.  Appreciate ID follow-up.  Active problems E. coli bacteriuria -unclear symptoms, defer to ID treatment  Amphetamine and psychostimulant-induced psychosis with hallucinations, history of schizophrenia-Long-term.  Psychiatry consulted and evaluated patient, for now continue regimen as below.  Apparently he ran out of his Abilify a week prior to admission, and started having auditory and visual hallucinations.  Now hallucinations are resolved  Hyponatremia-now resolved  Hypokalemia-repleted, potassium now normalized  Normocytic anemia-likely in the setting of infectious process.  Transfuse unit of packed red blood cells, hemoglobin improved appropriately and has remained stable  Left hip unstageable pressure ulcer-wound consulted, surgery following as well  Thrombocytosis-Resolved   Cellulitis of left foot -CT of the foot shows just edema no evidence of osteomyelitis. Continue antibiotics as above   Tobacco use disorder -Smoking cessation. Patient was offered and declined nicotine replacement.   Gastroesophageal reflux disease without esophagitis -Continue PPI   Amphetamine abuse (HCC)- TOC referral   Chronic multifocal osteomyelitis, left femur-ID following, appreciate input   Scheduled Meds:  ARIPiprazole  15 mg Oral Daily   Chlorhexidine Gluconate Cloth  6 each Topical Daily   Chlorhexidine Gluconate Cloth  6 each Topical Q0600   midodrine  5 mg Oral BID WC   mupirocin ointment  1 Application Nasal BID   pantoprazole  40 mg Oral Daily   pregabalin  150 mg Oral BID   senna-docusate  1 tablet Oral BID   sertraline  25 mg Oral Daily   thiamine (VITAMIN B1) injection  100 mg Intravenous Daily   Continuous Infusions:  sodium chloride Stopped (06/24/22 1012)   dextrose 5% lactated ringers 75 mL/hr at  06/25/22 1006   norepinephrine (LEVOPHED) Adult infusion 1 mcg/min (06/25/22 0935)   vancomycin Stopped (06/25/22 0751)   PRN Meds:.sodium chloride,  acetaminophen **OR** acetaminophen, fentaNYL (SUBLIMAZE) injection, ondansetron **OR** ondansetron (ZOFRAN) IV, mouth rinse, oxyCODONE, tiZANidine, traZODone  Current Outpatient Medications  Medication Instructions   acetaminophen (TYLENOL) 650 mg, Oral, 2 times daily PRN   ARIPiprazole (ABILIFY) 15 mg, Oral, Daily   Calcium Carbonate 500 mg, Oral, 2 times daily PRN   midodrine (PROAMATINE) 5 mg, Oral, 2 times daily PRN   mupirocin ointment (BACTROBAN) 2 % 1 Application, Nasal, 2 times daily   OLANZapine (ZYPREXA) 5 mg, Oral, Daily at bedtime   omeprazole (PRILOSEC) 40 mg, Oral, Daily   pregabalin (LYRICA) 150 mg, Oral, 2 times daily   sertraline (ZOLOFT) 25 mg, Oral, Daily   tiZANidine (ZANAFLEX) 4 mg, Oral, At bedtime PRN   traZODone (DESYREL) 50 mg, Oral, Nightly   trospium (SANCTURA) 20 mg, Oral, 2 times daily    Diet Orders (From admission, onward)     Start     Ordered   06/23/22 1153  Diet regular Room service appropriate? Yes; Fluid consistency: Thin  Diet effective now       Question Answer Comment  Room service appropriate? Yes   Fluid consistency: Thin      06/23/22 1152            DVT prophylaxis: SCDs Start: 06/23/22 16100722   Lab Results  Component Value Date   PLT 347 06/25/2022      Code Status: Full Code  Family Communication: no family at bedside  Status is: Inpatient  Remains inpatient appropriate because: On pressors  Level of care: ICU  Consultants:  ID PCCM General surgery Psychiatry  Objective: Vitals:   06/25/22 0515 06/25/22 0530 06/25/22 0545 06/25/22 0600  BP: (!) 120/56 124/70 132/71 128/68  Pulse: 90 70 67 65  Resp: 12 13 13 12   Temp:      TempSrc:      SpO2: 100% 98% 99% 99%  Weight:      Height:        Intake/Output Summary (Last 24 hours) at 06/25/2022 1058 Last data filed at 06/25/2022 0935 Gross per 24 hour  Intake 4056.77 ml  Output 3250 ml  Net 806.77 ml   Wt Readings from Last 3 Encounters:  06/23/22 72.6  kg  05/18/22 59 kg  01/04/22 72.3 kg    Examination:  Constitutional: NAD Eyes: no scleral icterus ENMT: Mucous membranes are moist.  Neck: normal, supple Respiratory: clear to auscultation bilaterally, no wheezing, no crackles.  Cardiovascular: Regular rate and rhythm, no murmurs / rubs / gallops.  Abdomen: non distended, no tenderness. Bowel sounds positive.  Musculoskeletal: no clubbing / cyanosis.  Skin: no rashes Neurologic: non focal   Data Reviewed: I have independently reviewed following labs and imaging studies   CBC Recent Labs  Lab 06/23/22 0423 06/23/22 1604 06/23/22 1728 06/23/22 1736 06/24/22 0044 06/24/22 0832 06/24/22 1659 06/25/22 0822  WBC 9.6 5.8 6.7  --  5.1  --  3.9* 4.6  HGB 7.9* 5.7* 6.3*  --  6.7* 7.7* 7.9* 7.9*  HCT 25.6* 18.8* 21.2*  --  22.5* 24.5* 26.1* 25.6*  PLT 462* 268 352 311 287  --  284 347  MCV 83.4 84.7 85.8  --  86.2  --  86.1 85.6  MCH 25.7* 25.7* 25.5*  --  25.7*  --  26.1 26.4  MCHC 30.9 30.3 29.7*  --  29.8*  --  30.3 30.9  RDW 16.8* 16.8* 16.8*  --  16.5*  --  16.3* 16.3*  LYMPHSABS 1.7  --   --   --   --   --  1.0 1.3  MONOABS 0.7  --   --   --   --   --  0.4 0.4  EOSABS 0.2  --   --   --   --   --  0.2 0.4  BASOSABS 0.0  --   --   --   --   --  0.0 0.0    Recent Labs  Lab 06/23/22 0423 06/23/22 0650 06/23/22 1604 06/23/22 1736 06/24/22 0044 06/24/22 1659 06/25/22 0822  NA 134*  --  134*  --  136 135 137  K 3.6  --  3.5  --  3.3* 3.4* 4.0  CL 102  --  105  --  107 106 106  CO2 24  --  23  --  GLUCOSE 115*  --  98  --  88 101* 100*  BUN 13  --  11  --  CREATININE 0.73  --  0.67  --  0.47* 0.59* 0.47*  CALCIUM 8.4*  --  7.8*  --  7.8* 7.3* 7.7*  AST 13*  --   --   --  6* 8* 8*  ALT 11  --   --   --  ALKPHOS 96  --   --   --  76 67 70  BILITOT 0.5  --   --   --  0.9 0.4 0.4  ALBUMIN 2.5*  --   --   --  2.0* 1.8* 2.0*  MG  --   --  1.7  --   --   --  1.8  DDIMER  --   --   --  1.80*   --   --   --   LATICACIDVEN 1.9 0.9 1.3 0.7  --   --   --   INR 1.2  --   --  1.3*  --   --   --   AMMONIA  --   --   --  21  --   --   --     ------------------------------------------------------------------------------------------------------------------ No results for input(s): "CHOL", "HDL", "LDLCALC", "TRIG", "CHOLHDL", "LDLDIRECT" in the last 72 hours.  Lab Results  Component Value Date   HGBA1C 4.6 (L) 01/05/2022   ------------------------------------------------------------------------------------------------------------------ No results for input(s): "TSH", "T4TOTAL", "T3FREE", "THYROIDAB" in the last 72 hours.  Invalid input(s): "FREET3"  Cardiac Enzymes No results for input(s): "CKMB", "TROPONINI", "MYOGLOBIN" in the last 168 hours.  Invalid input(s): "CK" ------------------------------------------------------------------------------------------------------------------ No results found for: "BNP"  CBG: Recent Labs  Lab 06/24/22 0816  GLUCAP 103*    Recent Results (from the past 240 hour(s))  Blood Culture (routine x 2)     Status: Abnormal   Collection Time: 06/23/22  4:23 AM   Specimen: BLOOD  Result Value Ref Range Status   Specimen Description   Final    BLOOD RIGHT ANTECUBITAL Performed at [facility], [address], [place], [place] [address]    Special Requests   Final    BOTTLES DRAWN AEROBIC AND ANAEROBIC Blood Culture results may not be optimal due to an excessive volume of blood received in culture bottles Performed at [facility], [address], [place], [place] [address]    Culture  Setup Time  Final    AEROBIC BOTTLE ONLY GRAM POSITIVE COCCI IN CLUSTERS CRITICAL RESULT CALLED TO, READ BACK BY AND VERIFIED WITH:  C/ PHARMD E. JACKSON 06/23/22 2250 A. LAFRANCE  Performed at [facility], [address], [place], [place] [address]    Culture METHICILLIN RESISTANT STAPHYLOCOCCUS AUREUS (A)  Final    Report Status 06/25/2022 FINAL  Final   Organism ID, Bacteria METHICILLIN RESISTANT STAPHYLOCOCCUS AUREUS  Final      Susceptibility   Methicillin resistant staphylococcus aureus - MIC*    CIPROFLOXACIN >=8 RESISTANT Resistant     ERYTHROMYCIN >=8 RESISTANT Resistant     GENTAMICIN <=0.5 SENSITIVE Sensitive     OXACILLIN >=4 RESISTANT Resistant     TETRACYCLINE >=16 RESISTANT Resistant     VANCOMYCIN 1 SENSITIVE Sensitive     TRIMETH/SULFA >=320 RESISTANT Resistant     CLINDAMYCIN RESISTANT Resistant     RIFAMPIN <=0.5 SENSITIVE Sensitive     Inducible Clindamycin POSITIVE Resistant     * METHICILLIN RESISTANT STAPHYLOCOCCUS AUREUS  Urine Culture     Status: Abnormal   Collection Time: 06/23/22  4:23 AM   Specimen: In/Out Cath Urine  Result Value Ref Range Status   Specimen Description   Final    IN/OUT CATH URINE Performed at [facility], [address], [place], [place] [address]    Special Requests   Final    Normal Performed at [facility], [address] [place], [place] [address]    Culture >=100,000 COLONIES/mL ESCHERICHIA COLI (A)  Final   Report Status 06/25/2022 FINAL  Final   Organism ID, Bacteria ESCHERICHIA COLI (A)  Final      Susceptibility   Escherichia coli - MIC*    AMPICILLIN <=2 SENSITIVE Sensitive     CEFAZOLIN <=4 SENSITIVE Sensitive     CEFEPIME <=0.12 SENSITIVE Sensitive     CEFTRIAXONE <=0.25 SENSITIVE Sensitive     CIPROFLOXACIN >=4 RESISTANT Resistant     GENTAMICIN <=1 SENSITIVE Sensitive     IMIPENEM <=0.25 SENSITIVE Sensitive     NITROFURANTOIN <=16 SENSITIVE Sensitive     TRIMETH/SULFA >=320 RESISTANT Resistant     AMPICILLIN/SULBACTAM <=2 SENSITIVE Sensitive     PIP/TAZO <=4 SENSITIVE Sensitive     * >=100,000 COLONIES/mL ESCHERICHIA COLI  Blood Culture ID Panel (Reflexed)     Status: Abnormal   Collection Time: 06/23/22  4:23 AM  Result Value Ref Range Status   Enterococcus faecalis NOT  DETECTED NOT DETECTED Final   Enterococcus Faecium NOT DETECTED NOT DETECTED Final   Listeria monocytogenes NOT DETECTED NOT DETECTED Final   Staphylococcus species DETECTED (A) NOT DETECTED Final    Comment: CRITICAL RESULT CALLED TO, READ BACK BY AND VERIFIED WITH:  C/ PHARMD E. JACKSON 06/23/22 2250 A. LAFRANCE     Staphylococcus aureus (BCID) DETECTED (A) NOT DETECTED Final    Comment: Methicillin (oxacillin)-resistant Staphylococcus aureus (MRSA). MRSA is predictably resistant to beta-lactam antibiotics (except ceftaroline). Preferred therapy is vancomycin unless clinically contraindicated. Patient requires contact precautions if  hospitalized. CRITICAL RESULT CALLED TO, READ BACK BY AND VERIFIED WITH:  C/ PHARMD E. JACKSON 06/23/22 2250 A. LAFRANCE     Staphylococcus epidermidis NOT DETECTED NOT DETECTED Final   Staphylococcus lugdunensis NOT DETECTED NOT DETECTED Final   Streptococcus species NOT DETECTED NOT DETECTED Final   Streptococcus agalactiae NOT DETECTED NOT DETECTED Final   Streptococcus pneumoniae NOT DETECTED NOT DETECTED Final   Streptococcus pyogenes NOT DETECTED NOT DETECTED Final  A.calcoaceticus-baumannii NOT DETECTED NOT DETECTED Final   Bacteroides fragilis NOT DETECTED NOT DETECTED Final   Enterobacterales NOT DETECTED NOT DETECTED Final   Enterobacter cloacae complex NOT DETECTED NOT DETECTED Final   Escherichia coli NOT DETECTED NOT DETECTED Final   Klebsiella aerogenes NOT DETECTED NOT DETECTED Final   Klebsiella oxytoca NOT DETECTED NOT DETECTED Final   Klebsiella pneumoniae NOT DETECTED NOT DETECTED Final   Proteus species NOT DETECTED NOT DETECTED Final   Salmonella species NOT DETECTED NOT DETECTED Final   Serratia marcescens NOT DETECTED NOT DETECTED Final   Haemophilus influenzae NOT DETECTED NOT DETECTED Final   Neisseria meningitidis NOT DETECTED NOT DETECTED Final   Pseudomonas aeruginosa NOT DETECTED NOT DETECTED Final   Stenotrophomonas  maltophilia NOT DETECTED NOT DETECTED Final   Candida albicans NOT DETECTED NOT DETECTED Final   Candida auris NOT DETECTED NOT DETECTED Final   Candida glabrata NOT DETECTED NOT DETECTED Final   Candida krusei NOT DETECTED NOT DETECTED Final   Candida parapsilosis NOT DETECTED NOT DETECTED Final   Candida tropicalis NOT DETECTED NOT DETECTED Final   Cryptococcus neoformans/gattii NOT DETECTED NOT DETECTED Final   Meth resistant mecA/C and MREJ DETECTED (A) NOT DETECTED Final    Comment: CRITICAL RESULT CALLED TO, READ BACK BY AND VERIFIED WITH:  C/ PHARMD E. JACKSON 06/23/22 2250 A. LAFRANCE  Performed at [facility], [address], [place], [place] [address]   Blood Culture (routine x 2)     Status: None (Preliminary result)   Collection Time: 06/23/22  4:28 AM   Specimen: BLOOD  Result Value Ref Range Status   Specimen Description   Final    BLOOD BLOOD RIGHT HAND Performed at [facility], [address], [place], [place] [address]    Special Requests   Final    BOTTLES DRAWN AEROBIC ONLY Blood Culture adequate volume Performed at [facility], [address], [place], [place] [address]    Culture   Final    NO GROWTH 1 DAY Performed at [facility], [address], [place], [place] [address]    Report Status PENDING  Incomplete  Resp Panel by RT-PCR (Flu A&B, Covid) Anterior Nasal Swab     Status: None   Collection Time: 06/23/22  5:01 AM   Specimen: Anterior Nasal Swab  Result Value Ref Range Status   SARS Coronavirus 2 by RT PCR NEGATIVE NEGATIVE Final    Comment: (NOTE) SARS-CoV-2 target nucleic acids are NOT DETECTED.  The SARS-CoV-2 RNA is generally detectable in upper respiratory specimens during the acute phase of infection. The lowest concentration of SARS-CoV-2 viral copies this assay can detect is 138 copies/mL. A negative result does not preclude SARS-Cov-2 infection and should not be used as the sole basis  for treatment or other patient management decisions. A negative result may occur with  improper specimen collection/handling, submission of specimen other than nasopharyngeal swab, presence of viral mutation(s) within the areas targeted by this assay, and inadequate number of viral copies(<138 copies/mL). A negative result must be combined with clinical observations, patient history, and epidemiological information. The expected result is Negative.  Fact Sheet for Patients:  BloggerCourse.com  Fact Sheet for Healthcare Providers:  SeriousBroker.it  This test is no t yet approved or cleared by the [place] FDA and  has been authorized for detection and/or diagnosis of SARS-CoV-2 by FDA under an Emergency Use Authorization (EUA). This EUA will remain  in effect (meaning this  test can be used) for the duration of the COVID-19 declaration under Section 564(b)(1) of the Act, 21 U.S.C.section 360bbb-3(b)(1), unless the authorization is terminated  or revoked sooner.       Influenza A by PCR NEGATIVE NEGATIVE Final   Influenza B by PCR NEGATIVE NEGATIVE Final    Comment: (NOTE) The Xpert Xpress SARS-CoV-2/FLU/RSV plus assay is intended as an aid in the diagnosis of influenza from Nasopharyngeal swab specimens and should not be used as a sole basis for treatment. Nasal washings and aspirates are unacceptable for Xpert Xpress SARS-CoV-2/FLU/RSV testing.  Fact Sheet for Patients: BloggerCourse.com  Fact Sheet for Healthcare Providers: SeriousBroker.it  This test is not yet approved or cleared by the [place] FDA and has been authorized for detection and/or diagnosis of SARS-CoV-2 by FDA under an Emergency Use Authorization (EUA). This EUA will remain in effect (meaning this test can be used) for the duration of the COVID-19 declaration under Section 564(b)(1) of the Act, 21  U.S.C. section 360bbb-3(b)(1), unless the authorization is terminated or revoked.  Performed at [facility], [address], [place], [place] [address]   MRSA Next Gen by PCR, Nasal     Status: Abnormal   Collection Time: 06/23/22  5:53 PM   Specimen: Nasal Mucosa; Nasal Swab  Result Value Ref Range Status   MRSA by PCR Next Gen DETECTED (A) NOT DETECTED Final    Comment: CRITICAL RESULT CALLED TO, READ BACK BY AND VERIFIED WITH: BLOCK,D. 06/23/22  BY SEEL,M. (NOTE) The GeneXpert MRSA Assay (FDA approved for NASAL specimens only), is one component of a comprehensive MRSA colonization surveillance program. It is not intended to diagnose MRSA infection nor to guide or monitor treatment for MRSA infections. Test performance is not FDA approved in patients less than 63 years old. Performed at [facility], [address] [place], [place] [address]      Radiology Studies: ECHOCARDIOGRAM COMPLETE  Result Date: 06/25/2022    ECHOCARDIOGRAM REPORT   Patient Name:   Steve [name] [facility] Date of Exam: 06/25/2022 Medical Rec #:  [id number]       Height:       71.0 in Accession #:    [id number]      Weight:       160.0 lb Date of Birth:  Oct 26, 1990       BSA:          1.918 m Patient Age:    31 years        BP:           128/68 mmHg Patient Gender: M               HR:           62 bpm. Exam Location:  Inpatient Procedure: 2D Echo, Cardiac Doppler and Color Doppler Indications:    Bacteremia  History:        Patient has prior history of Echocardiogram examinations, most                 recent 08/30/2021.  Sonographer:    Eduard Roux Referring Phys: [id number] TRUNG T VU IMPRESSIONS  1. Left ventricular ejection fraction, by estimation, is 60 to 65%. The left ventricle has normal function. The left ventricle has no regional wall motion abnormalities. Left ventricular diastolic parameters were normal.  2. Right ventricular systolic function is normal. The right  ventricular size is normal.  3. Left atrial size was mildly dilated.  4. The  mitral valve is normal in structure. Mild mitral valve regurgitation. No evidence of mitral stenosis.  5. The aortic valve is normal in structure. Aortic valve regurgitation is not visualized. No aortic stenosis is present.  6. The inferior vena cava is normal in size with greater than 50% respiratory variability, suggesting right atrial pressure of 3 mmHg. Comparison(s): Prior images reviewed side by side. The left ventricular function has improved. Conclusion(s)/Recommendation(s): No evidence of valvular vegetations on this transthoracic echocardiogram. Consider a transesophageal echocardiogram to exclude infective endocarditis if clinically indicated. FINDINGS  Left Ventricle: Left ventricular ejection fraction, by estimation, is 60 to 65%. The left ventricle has normal function. The left ventricle has no regional wall motion abnormalities. The left ventricular internal cavity size was normal in size. There is  no left ventricular hypertrophy. Left ventricular diastolic parameters were normal. Right Ventricle: The right ventricular size is normal. No increase in right ventricular wall thickness. Right ventricular systolic function is normal. Left Atrium: Left atrial size was mildly dilated. Right Atrium: Right atrial size was normal in size. Pericardium: There is no evidence of pericardial effusion. Mitral Valve: The mitral valve is normal in structure. Mild mitral valve regurgitation. No evidence of mitral valve stenosis. Tricuspid Valve: The tricuspid valve is normal in structure. Tricuspid valve regurgitation is not demonstrated. No evidence of tricuspid stenosis. Aortic Valve: The aortic valve is normal in structure. Aortic valve regurgitation is not visualized. No aortic stenosis is present. Aortic valve peak gradient measures 7.2 mmHg. Pulmonic Valve: The pulmonic valve was normal in structure. Pulmonic valve regurgitation is mild.  No evidence of pulmonic stenosis. Aorta: The aortic root is normal in size and structure. Venous: The inferior vena cava is normal in size with greater than 50% respiratory variability, suggesting right atrial pressure of 3 mmHg. IAS/Shunts: No atrial level shunt detected by color flow Doppler.  LEFT VENTRICLE PLAX 2D LVIDd:         5.20 cm   Diastology LVIDs:         3.10 cm   LV e' medial:    12.10 cm/s LV PW:         1.10 cm   LV E/e' medial:  7.6 LV IVS:        1.00 cm   LV e' lateral:   17.80 cm/s LVOT diam:     2.30 cm   LV E/e' lateral: 5.2 LV SV:         109 LV SV Index:   57 LVOT Area:     4.15 cm  RIGHT VENTRICLE RV Basal diam:  3.10 cm RV S prime:     15.00 cm/s TAPSE (M-mode): 2.6 cm LEFT ATRIUM             Index        RIGHT ATRIUM           Index LA diam:        4.60 cm 2.40 cm/m   RA Area:     20.10 cm LA Vol (A2C):   69.0 ml 35.98 ml/m  RA Volume:   63.80 ml  33.27 ml/m LA Vol (A4C):   76.3 ml 39.79 ml/m LA Biplane Vol: 73.1 ml 38.12 ml/m  AORTIC VALVE                 PULMONIC VALVE AV Area (Vmax): 3.98 cm     PV Vmax:       0.85 m/s AV Vmax:  134.50 cm/s  PV Peak grad:  2.9 mmHg AV Peak Grad:   7.2 mmHg LVOT Vmax:      129.00 cm/s LVOT Vmean:     84.400 cm/s LVOT VTI:       0.263 m  AORTA Ao Root diam: 3.40 cm Ao Asc diam:  2.90 cm MITRAL VALVE               TRICUSPID VALVE MV Area (PHT): 3.68 cm    TR Peak grad:   24.4 mmHg MV Decel Time: 206 msec    TR Vmax:        247.00 cm/s MV E velocity: 92.10 cm/s MV A velocity: 45.80 cm/s  SHUNTS MV E/A ratio:  2.01        Systemic VTI:  0.26 m                            Systemic Diam: 2.30 cm Candee Furbish MD Electronically signed by Candee Furbish MD Signature Date/Time: 06/25/2022/10:40:32 AM    Final    CT FOOT LEFT W CONTRAST  Result Date: 06/24/2022 CLINICAL DATA:  Foot trauma 3 days ago. Increased erythema, warmth and tenderness. EXAM: CT OF THE LOWER LEFT EXTREMITY WITH CONTRAST TECHNIQUE: Multidetector CT imaging of the left foot was  performed according to the standard protocol following intravenous contrast administration. RADIATION DOSE REDUCTION: This exam was performed according to the departmental dose-optimization program which includes automated exposure control, adjustment of the mA and/or kV according to patient size and/or use of iterative reconstruction technique. CONTRAST:  133mL OMNIPAQUE IOHEXOL 300 MG/ML  SOLN COMPARISON:  Radiographs 06/23/2022 and 07/27/2021. FINDINGS: Bones/Joint/Cartilage The bones appear demineralized. There is relative sclerosis of the talar dome which appears similar to the opposite side. No focal osteochondral lesion identified. No evidence of acute fracture, dislocation or bone destruction. The joint spaces appear preserved, and no significant joint effusions are identified. Ligaments Suboptimally assessed by CT. Muscles and Tendons As evaluated by CT, the ankle tendons appear intact and normally located. No significant tenosynovitis identified in the foot. No focal muscular abnormalities or abnormal enhancement. Soft tissues Diffuse moderate edematous changes throughout the dorsal subcutaneous fat of the left foot with more diffuse involvement of the subcutaneous tissues surrounding the ankle. No focal fluid collection, abnormal enhancement, foreign body or soft tissue emphysema identified. IMPRESSION: 1. Diffuse edematous changes throughout the subcutaneous tissues of the left foot and ankle without focal fluid collection, foreign body or soft tissue emphysema. 2. No evidence of osteomyelitis or septic arthritis. 3. The bones appear demineralized. Electronically Signed   By: Richardean Sale M.D.   On: 06/24/2022 11:38     Marzetta Board, MD, PhD [facility]  Between 7 am - 7 pm I am available, please contact me via Amion (for emergencies) or Securechat (non urgent messages)  Between 7 pm - 7 am I am not available, please contact night coverage MD/APP via Amion

## 2022-06-25 NOTE — Progress Notes (Signed)
I engaged in an initial visit with Steve [name]  I worked to Cytogeneticist and relationship through getting to know him.  Steve [name] shared that he is from [place] and has a number of family members here, but that he doesn't see or speak to them often.  Steve [name] does have a spouse, and his wife has been visiting at the hospital.  Steve [name] was also able to talk about his healthcare journey.  He talked about not feeling well and being in pain for quite some time.  I offered compassionate affirmation regarding his journey.   Steve [name] was also able to talk about his belief system.  He stated that he uses prayer as a way to help him through hard times.  He voiced that he learned prayer through his parents.  I was able to provide prayer with Steve [name]  He wanted to pray for his family and his desire to walk again.   I offered community, reflective listening, and prayer.   Sharday Michl, MDiv    06/25/22 1300  Clinical Encounter Type  Visited With Patient  Visit Type Initial;Spiritual support  Referral From Nurse  Consult/Referral To Chaplain  Spiritual Encounters  Spiritual Needs Prayer  Stress Factors  Patient Stress Factors Health changes;Exhausted

## 2022-06-25 NOTE — Progress Notes (Signed)
NAME:  Steve [name], MRN:  [id number], DOB:  03-26-1991, LOS: 2 ADMISSION DATE:  06/23/2022, CONSULTATION DATE:  06/23/22 REFERRING MD:  Olevia Bowens, CHIEF COMPLAINT:  leg pain   BRIEF  31yM with history of anxiety, depression, schizophrenia, paraplegia - does in/out cath at home, PTSD who was brough in by EMS for worsening leg spasms over last couple of weeks. Much of history is obtained through chart review as he is relatively lethargic at time of my interview. He has had chills, night sweats, thinks he's had fever over similar time frame. He also says he injured his left foot about 3 days PTA and it's now become erythematous, warm and tender.   In ED he was initially normotensive but later febrile, hypotensive (though with somewhat variable pressures when cuff site switched arm to arm), lethargic (though had also been given low dose ativan/fentanyl in attempt to facilitate imaging  due to his anxiety/agitation, in addition to robaxin as well as home abilify, lyrica), concern for sepsis due to acute on chronic osteomyelitis of unstageable left thigh pressure wound, SSTI of LLE, and/or UTI.  He was started on vanc/cefepime and given 4250cc crystalloid, then started on low dose levophed peripherally.   Pertinent  Medical History  Anxiety Depression PTSD Paraplegia Schizophrenia Anemia  Significant Hospital Events: Including procedures, antibiotic start and stop dates in addition to other pertinent events   10/7 admitted, sepsis bolus +, low dose levo, started on vanc/meropenem MRSA PCR - POSITIVE Blood cuolture - STaph aureus - sensit pending BCID positive Urine culture > 100K  E Colii 10/8  - Transfused 2U pRBC as Hb drifted following 4.5L fluid resuscitation, remains on low dose levophed.  Interim History / Subjective:   06/25/22 - 99% room air. On levophed 103mcg. On vanc and merrem. Afebrile now WBC dropped to 3.9K. Urine culture with > $100K e colii and Blood culture with STaph aureus. More  awake. IV team could not get blood drawn  Objective   Blood pressure 128/68, pulse 65, temperature 99 F (37.2 C), resp. rate 12, height 5\' 11"  (1.803 m), weight 72.6 kg, SpO2 99 %.        Intake/Output Summary (Last 24 hours) at 06/25/2022 0727 Last data filed at 06/25/2022 0600 Gross per 24 hour  Intake 3838.66 ml  Output 3250 ml  Net 588.66 ml   Filed Weights   06/23/22 0420  Weight: 72.6 kg    General Appearance:  Looks chronic  unwell and flat affect Head:  Normocephalic, without obvious abnormality, atraumatic . Eyes:  PERRL - yes, conjunctiva/corneas - muddy     Ears:  Normal external ear canals, both ears Nose:  G tube - no Throat:  ETT TUBE - no , OG tube - no. OLD TRACH SCAR + Neck:  Supple,  No enlargement/tenderness/nodules Lungs: Clear to auscultation bilaterally, Heart:  S1 and S2 normal, no murmur, CVP - no.  Pressors - no Abdomen:  Soft, no masses, no organomegaly Genitalia / Rectal:  Not done Extremities:  Extremities-  intact. PARAPLEGIC Skin:  ntact in exposed areas . Sacral area - not examined Neurologic:  Sedation - none -> RASS - +1 . Moves all 4s - no. CAM-ICU - neg . Orientation - x3      Resolved Hospital Problem list    Assessment & Plan:   # Hypotension # Severe sepsis with septic shock - acute metabolic encephalopathy, oliguria Sources of sepsis potentially include acute on chronic OM, SSTI LLE, UTI. - growing E  colii UTI and Staph aureus in BCID  06/25/22 - afebrile. Coming down on pressors. Levophed down to 29mcg  MAP 85  Plan  - Levophed for MAP > 65 - continue midodrine -  vanc/merrem given relatively recent history of ESBL follow cultures and narrow as able - f/u CT A/P, LLE - WOCN assessment of chronic wounds - surgery contacted in ED though he was resistant to imaging at that time - albumin as needed  # Acute toxic metabolic encephalopathy May be due to sepsis/hypotension vs multiple sedating medications vs possibility of  recent methamphetamine or other substance use.   06/25/22: improving/resolved  Plan - gradually resume home lyrica, antispasmodics - f/u response to resuscitative measures above  # Anemia of chronic disease # Acute blood loss anemia  - Dilutional plus minor extent of bleeding in chronic wounds - s/p PRBC 06/24/22   Plan - - PRBC for hgb </= 6.9gm%    - exceptions are   -  if ACS susepcted/confirmed then transfuse for hgb </= 8.0gm%,  or    -  active bleeding with hemodynamic instability, then transfuse regardless of hemoglobin value   At at all times try to transfuse 1 unit prbc as possible with exception of active hemorrhage    # Schizophrenia # Mood disturbances  Plan - home abilify  # Moderate protein calorie malnutrition - thiamine   Best Practice (right click and "Reselect all SmartList Selections" daily)   Diet/type: Regular consistency (see orders) DVT prophylaxis: SCD GI prophylaxis: PPI Lines: N/A Foley:  Yes, and it is still needed Code Status:  full code Last date of multidisciplinary goals of care discussion   06/25/22 - called [place] - wife: "Call could not be complete"      ATTESTATION & SIGNATURE   The patient [name] is critically ill with multiple organ systems failure and requires high complexity decision making for assessment and support, frequent evaluation and titration of therapies, application of advanced monitoring technologies and extensive interpretation of multiple databases.   Critical Care Time devoted to patient care services described in this note is  30  Minutes. This time reflects time of care of this signee Dr Brand Males. This critical care time does not reflect procedure time, or teaching time or supervisory time of PA/NP/Med student/Med Resident etc but could involve care discussion time     Dr. Brand Males, M.D., [facility].C.P Pulmonary and Critical Care Medicine Medical Director - [facility] ICU Staff Physician, [place] [facility] Pager: [phone], If no answer or between  15:00h - 7:00h: call [phone]  06/25/2022 7:35 AM    LABS    PULMONARY Recent Labs  Lab 06/23/22 1736  HCO3 24.7  O2SAT 84.3    CBC Recent Labs  Lab 06/23/22 1728 06/23/22 1736 06/24/22 0044 06/24/22 0832 06/24/22 1659  HGB 6.3*  --  6.7* 7.7* 7.9*  HCT 21.2*  --  22.5* 24.5* 26.1*  WBC 6.7  --  5.1  --  3.9*  PLT 352 311 287  --  284    COAGULATION Recent Labs  Lab 06/23/22 0423 06/23/22 1736  INR 1.2 1.3*    CARDIAC  No results for input(s): "TROPONINI" in the last 168 hours. No results for input(s): "PROBNP" in the last 168 hours.   CHEMISTRY Recent Labs  Lab 06/23/22 0423 06/23/22 1604 06/24/22 0044 06/24/22 1659  NA 134* 134* 136 135  K 3.6 3.5 3.3* 3.4*  CL 102 105 107 106  CO2 24 23 24 24   GLUCOSE 115* 98 88 101*  BUN 13 11 8 8   CREATININE 0.73 0.67 0.47* 0.59*  CALCIUM 8.4* 7.8* 7.8* 7.3*  MG  --  1.7  --   --   PHOS  --  3.8  --   --    Estimated Creatinine Clearance: 136.1 mL/min (A) (by C-G formula based on SCr of 0.59 mg/dL (L)).   LIVER Recent Labs  Lab 06/23/22 0423 06/23/22 1736 06/24/22 0044 06/24/22 1659  AST 13*  --  6* 8*  ALT 11  --  8 8  ALKPHOS 96  --  76 67  BILITOT 0.5  --  0.9 0.4  PROT 7.9  --  6.4* 5.7*  ALBUMIN 2.5*  --  2.0* 1.8*  INR 1.2 1.3*  --   --      INFECTIOUS Recent Labs  Lab 06/23/22 0650 06/23/22 1604 06/23/22 1736  LATICACIDVEN 0.9 1.3 0.7     ENDOCRINE CBG (last 3)  Recent Labs    06/24/22 0816  GLUCAP 103*         IMAGING x48h  - image(s) personally visualized  -   highlighted in bold CT FOOT LEFT W CONTRAST  Result Date: 06/24/2022 CLINICAL DATA:  Foot trauma 3 days ago. Increased erythema, warmth and tenderness. EXAM: CT OF THE LOWER LEFT EXTREMITY WITH CONTRAST TECHNIQUE: Multidetector CT imaging of the left foot was  performed according to the standard protocol following intravenous contrast administration. RADIATION DOSE REDUCTION: This exam was performed according to the departmental dose-optimization program which includes automated exposure control, adjustment of the mA and/or kV according to patient size and/or use of iterative reconstruction technique. CONTRAST:  134mL OMNIPAQUE IOHEXOL 300 MG/ML  SOLN COMPARISON:  Radiographs 06/23/2022 and 07/27/2021. FINDINGS: Bones/Joint/Cartilage The bones appear demineralized. There is relative sclerosis of the talar dome which appears similar to the opposite side. No focal osteochondral lesion identified. No evidence of acute fracture, dislocation or bone destruction. The joint spaces appear preserved, and no significant joint effusions are identified. Ligaments Suboptimally assessed by CT. Muscles and Tendons As evaluated by CT, the ankle tendons appear intact and normally located. No significant tenosynovitis identified in the foot. No focal muscular abnormalities or abnormal enhancement. Soft tissues Diffuse moderate edematous changes throughout the dorsal subcutaneous fat of the left foot with more diffuse involvement of the subcutaneous tissues surrounding the ankle. No focal fluid collection, abnormal enhancement, foreign body or soft tissue emphysema identified. IMPRESSION: 1. Diffuse edematous changes throughout the subcutaneous tissues of the left foot and ankle without focal fluid collection, foreign body or soft tissue emphysema. 2. No evidence of osteomyelitis or septic arthritis. 3. The bones appear demineralized. Electronically Signed   By: Richardean Sale M.D.   On: 06/24/2022 11:38

## 2022-06-25 NOTE — Progress Notes (Signed)
Subjective: CC: Seen for wound eval.  Reports wound has been present > 1 year.  Normal does wtd dressings daily or every other day at home. His wife helps care for this.  Hx debridement at [facility] ~1 month ago per patient report  Objective: Vital signs in last 24 hours: Temp:  [98.1 F (36.7 C)-99.9 F (37.7 C)] 99 F (37.2 C) (10/09 0415) Pulse Rate:  [58-91] 65 (10/09 0600) Resp:  [6-26] 12 (10/09 0600) BP: (82-147)/(35-91) 128/68 (10/09 0600) SpO2:  [94 %-100 %] 99 % (10/09 0600) Last BM Date : 06/19/22  Intake/Output from previous day: 10/08 0701 - 10/09 0700 In: 3838.7 [P.O.:960; I.V.:2026.5; IV Piggyback:852.2] Out: 3250 [Urine:3250] Intake/Output this shift: No intake/output data recorded.  PE: Gen:  Alert, NAD, pleasant Wound: As noted below. Wound beds are clean with mostly granulation tissue with some fibrinous tissue. The 2 wounds connect under skin bridge. The most lateral wound undermines towards R hip. No obvious active drainage.     Lab Results:  Recent Labs    06/24/22 0044 06/24/22 0832 06/24/22 1659  WBC 5.1  --  3.9*  HGB 6.7* 7.7* 7.9*  HCT 22.5* 24.5* 26.1*  PLT 287  --  284   BMET Recent Labs    06/24/22 0044 06/24/22 1659  NA 136 135  K 3.3* 3.4*  CL 107 106  CO2 24 24  GLUCOSE 88 101*  BUN 8 8  CREATININE 0.47* 0.59*  CALCIUM 7.8* 7.3*   PT/INR Recent Labs    06/23/22 0423 06/23/22 1736  LABPROT 15.2 15.8*  INR 1.2 1.3*   CMP     Component Value Date/Time   NA 135 06/24/2022 1659   NA 136 05/08/2013 0058   K 3.4 (L) 06/24/2022 1659   K 3.6 05/08/2013 0058   CL 106 06/24/2022 1659   CL 102 05/08/2013 0058   CO2 24 06/24/2022 1659   CO2 25 05/08/2013 0058   GLUCOSE 101 (H) 06/24/2022 1659   GLUCOSE 114 (H) 05/08/2013 0058   BUN 8 06/24/2022 1659   BUN 12 05/08/2013 0058   CREATININE 0.59 (L) 06/24/2022 1659   CREATININE 0.98 05/08/2013 0058   CALCIUM 7.3 (L) 06/24/2022 1659   CALCIUM 10.7 (H) 05/08/2013 0058    PROT 5.7 (L) 06/24/2022 1659   PROT 9.2 (H) 05/08/2013 0058   ALBUMIN 1.8 (L) 06/24/2022 1659   ALBUMIN 5.4 (H) 05/08/2013 0058   AST 8 (L) 06/24/2022 1659   AST 31 05/08/2013 0058   ALT 8 06/24/2022 1659   ALT 32 05/08/2013 0058   ALKPHOS 67 06/24/2022 1659   ALKPHOS 85 05/08/2013 0058   BILITOT 0.4 06/24/2022 1659   BILITOT 1.6 (H) 05/08/2013 0058   GFRNONAA >60 06/24/2022 1659   GFRNONAA >60 05/08/2013 0058   GFRAA >60 03/30/2020 1230   GFRAA >60 05/08/2013 0058   Lipase  No results found for: "LIPASE"  Studies/Results: CT FOOT LEFT W CONTRAST  Result Date: 06/24/2022 CLINICAL DATA:  Foot trauma 3 days ago. Increased erythema, warmth and tenderness. EXAM: CT OF THE LOWER LEFT EXTREMITY WITH CONTRAST TECHNIQUE: Multidetector CT imaging of the left foot was performed according to the standard protocol following intravenous contrast administration. RADIATION DOSE REDUCTION: This exam was performed according to the departmental dose-optimization program which includes automated exposure control, adjustment of the mA and/or kV according to patient size and/or use of iterative reconstruction technique. CONTRAST:  166mL OMNIPAQUE IOHEXOL 300 MG/ML  SOLN COMPARISON:  Radiographs  06/23/2022 and 07/27/2021. FINDINGS: Bones/Joint/Cartilage The bones appear demineralized. There is relative sclerosis of the talar dome which appears similar to the opposite side. No focal osteochondral lesion identified. No evidence of acute fracture, dislocation or bone destruction. The joint spaces appear preserved, and no significant joint effusions are identified. Ligaments Suboptimally assessed by CT. Muscles and Tendons As evaluated by CT, the ankle tendons appear intact and normally located. No significant tenosynovitis identified in the foot. No focal muscular abnormalities or abnormal enhancement. Soft tissues Diffuse moderate edematous changes throughout the dorsal subcutaneous fat of the left foot with more  diffuse involvement of the subcutaneous tissues surrounding the ankle. No focal fluid collection, abnormal enhancement, foreign body or soft tissue emphysema identified. IMPRESSION: 1. Diffuse edematous changes throughout the subcutaneous tissues of the left foot and ankle without focal fluid collection, foreign body or soft tissue emphysema. 2. No evidence of osteomyelitis or septic arthritis. 3. The bones appear demineralized. Electronically Signed   By: Carey Bullocks M.D.   On: 06/24/2022 11:38    Anti-infectives: Anti-infectives (From admission, onward)    Start     Dose/Rate Route Frequency Ordered Stop   06/24/22 1800  vancomycin (VANCOREADY) IVPB 1500 mg/300 mL        1,500 mg 150 mL/hr over 120 Minutes Intravenous Every 12 hours 06/24/22 0831     06/23/22 2000  meropenem (MERREM) 1 g in sodium chloride 0.9 % 100 mL IVPB        1 g 200 mL/hr over 30 Minutes Intravenous Every 8 hours 06/23/22 1804     06/23/22 1800  vancomycin (VANCOREADY) IVPB 1250 mg/250 mL  Status:  Discontinued        1,250 mg 166.7 mL/hr over 90 Minutes Intravenous Every 12 hours 06/23/22 1019 06/24/22 0831   06/23/22 1800  metroNIDAZOLE (FLAGYL) IVPB 500 mg  Status:  Discontinued        500 mg 100 mL/hr over 60 Minutes Intravenous Every 12 hours 06/23/22 1540 06/23/22 1808   06/23/22 1400  ceFEPIme (MAXIPIME) 2 g in sodium chloride 0.9 % 100 mL IVPB  Status:  Discontinued        2 g 200 mL/hr over 30 Minutes Intravenous Every 8 hours 06/23/22 1003 06/23/22 1758   06/23/22 0515  ceFEPIme (MAXIPIME) 2 g in sodium chloride 0.9 % 100 mL IVPB        2 g 200 mL/hr over 30 Minutes Intravenous  Once 06/23/22 0500 06/23/22 0710   06/23/22 0515  metroNIDAZOLE (FLAGYL) IVPB 500 mg        500 mg 100 mL/hr over 60 Minutes Intravenous  Once 06/23/22 0500 06/23/22 0719   06/23/22 0515  vancomycin (VANCOCIN) IVPB 1000 mg/200 mL premix        1,000 mg 200 mL/hr over 60 Minutes Intravenous  Once 06/23/22 0500 06/23/22 0751         Assessment/Plan Unstageable decubitus ulcers with chronic osteomyelitis - No indication for any surgical debridement at this time. Wound clean - Cont BID WTD as outlined by WOCN - Pressure redistrubution with frequent turns and air mattress recommended  - Abx per ID. No imaging done here. Noted to have chronic osteo on imaging >1 month ago.  - We will sign off at this time. Please call back with questions or concerns.    LOS: 2 days    Jacinto Halim , [facility] 06/25/2022, 8:33 AM Please see Amion for pager number during day hours 7:00am-4:30pm

## 2022-06-25 NOTE — Progress Notes (Signed)
[facility]   Reason for visit: Follow up on bacteremia  Interval History: 1/3 blood cultures positive for MRSA; initial mild fever to 101.1; no leukocytosis; TTE without concerns.  Day 4 total antibiotics  Physical Exam: Constitutional:  Vitals:   06/25/22 1000 06/25/22 1100  BP:  130/62  Pulse: 82 77  Resp: 11 (!) 6  Temp: 98.1 F (36.7 C) 98.2 F (36.8 C)  SpO2: 98% 99%   patient appears in NAD Respiratory: Normal respiratory effort; CTA B Cardiovascular: RRR  Review of Systems: Constitutional: negative for fevers and chills  Lab Results  Component Value Date   WBC 4.6 06/25/2022   HGB 7.9 (L) 06/25/2022   HCT 25.6 (L) 06/25/2022   MCV 85.6 06/25/2022   PLT 347 06/25/2022    Lab Results  Component Value Date   CREATININE 0.47 (L) 06/25/2022   BUN 6 06/25/2022   NA 137 06/25/2022   K 4.0 06/25/2022   CL 106 06/25/2022   CO2 26 06/25/2022    Lab Results  Component Value Date   ALT 8 06/25/2022   AST 8 (L) 06/25/2022   ALKPHOS 70 06/25/2022     Microbiology: Recent Results (from the past 240 hour(s))  Blood Culture (routine x 2)     Status: Abnormal   Collection Time: 06/23/22  4:23 AM   Specimen: BLOOD  Result Value Ref Range Status   Specimen Description   Final    BLOOD RIGHT ANTECUBITAL Performed at [facility], [address], [place], [place] [address]    Special Requests   Final    BOTTLES DRAWN AEROBIC AND ANAEROBIC Blood Culture results may not be optimal due to an excessive volume of blood received in culture bottles Performed at [facility], [address], [place], [place] [address]    Culture  Setup Time   Final    AEROBIC BOTTLE ONLY GRAM POSITIVE COCCI IN CLUSTERS CRITICAL RESULT CALLED TO, READ BACK BY AND VERIFIED WITH:  C/ PHARMD E. JACKSON 06/23/22 2250 A. LAFRANCE  Performed at [facility], [address], [place], [place] [address]    Culture  METHICILLIN RESISTANT STAPHYLOCOCCUS AUREUS (A)  Final   Report Status 06/25/2022 FINAL  Final   Organism ID, Bacteria METHICILLIN RESISTANT STAPHYLOCOCCUS AUREUS  Final      Susceptibility   Methicillin resistant staphylococcus aureus - MIC*    CIPROFLOXACIN >=8 RESISTANT Resistant     ERYTHROMYCIN >=8 RESISTANT Resistant     GENTAMICIN <=0.5 SENSITIVE Sensitive     OXACILLIN >=4 RESISTANT Resistant     TETRACYCLINE >=16 RESISTANT Resistant     VANCOMYCIN 1 SENSITIVE Sensitive     TRIMETH/SULFA >=320 RESISTANT Resistant     CLINDAMYCIN RESISTANT Resistant     RIFAMPIN <=0.5 SENSITIVE Sensitive     Inducible Clindamycin POSITIVE Resistant     * METHICILLIN RESISTANT STAPHYLOCOCCUS AUREUS  Urine Culture     Status: Abnormal   Collection Time: 06/23/22  4:23 AM   Specimen: In/Out Cath Urine  Result Value Ref Range Status   Specimen Description   Final    IN/OUT CATH URINE Performed at [facility], [address], [place], [place] [address]    Special Requests   Final    Normal Performed at [facility], [address], [place], [place] [address]    Culture >=100,000 COLONIES/mL ESCHERICHIA COLI (A)  Final   Report Status 06/25/2022 FINAL  Final   Organism ID, Bacteria ESCHERICHIA COLI (A)  Final      Susceptibility   Escherichia coli - MIC*    AMPICILLIN <=2 SENSITIVE Sensitive     CEFAZOLIN <=4 SENSITIVE Sensitive     CEFEPIME <=0.12 SENSITIVE Sensitive     CEFTRIAXONE <=0.25 SENSITIVE Sensitive     CIPROFLOXACIN >=4 RESISTANT Resistant     GENTAMICIN <=1 SENSITIVE Sensitive     IMIPENEM <=0.25 SENSITIVE Sensitive     NITROFURANTOIN <=16 SENSITIVE Sensitive     TRIMETH/SULFA >=320 RESISTANT Resistant     AMPICILLIN/SULBACTAM <=2 SENSITIVE Sensitive     PIP/TAZO <=4 SENSITIVE Sensitive     * >=100,000 COLONIES/mL ESCHERICHIA COLI  Blood Culture ID Panel (Reflexed)     Status: Abnormal   Collection Time: 06/23/22  4:23 AM  Result  Value Ref Range Status   Enterococcus faecalis NOT DETECTED NOT DETECTED Final   Enterococcus Faecium NOT DETECTED NOT DETECTED Final   Listeria monocytogenes NOT DETECTED NOT DETECTED Final   Staphylococcus species DETECTED (A) NOT DETECTED Final    Comment: CRITICAL RESULT CALLED TO, READ BACK BY AND VERIFIED WITH:  C/ PHARMD E. JACKSON 06/23/22 2250 A. LAFRANCE     Staphylococcus aureus (BCID) DETECTED (A) NOT DETECTED Final    Comment: Methicillin (oxacillin)-resistant Staphylococcus aureus (MRSA). MRSA is predictably resistant to beta-lactam antibiotics (except ceftaroline). Preferred therapy is vancomycin unless clinically contraindicated. Patient requires contact precautions if  hospitalized. CRITICAL RESULT CALLED TO, READ BACK BY AND VERIFIED WITH:  C/ PHARMD E. JACKSON 06/23/22 2250 A. LAFRANCE     Staphylococcus epidermidis NOT DETECTED NOT DETECTED Final   Staphylococcus lugdunensis NOT DETECTED NOT DETECTED Final   Streptococcus species NOT DETECTED NOT DETECTED Final   Streptococcus agalactiae NOT DETECTED NOT DETECTED Final   Streptococcus pneumoniae NOT DETECTED NOT DETECTED Final   Streptococcus pyogenes NOT DETECTED NOT DETECTED Final   A.calcoaceticus-baumannii NOT DETECTED NOT DETECTED Final   Bacteroides fragilis NOT DETECTED NOT DETECTED Final   Enterobacterales NOT DETECTED NOT DETECTED Final   Enterobacter cloacae complex NOT DETECTED NOT DETECTED Final   Escherichia coli NOT DETECTED NOT DETECTED Final   Klebsiella aerogenes NOT DETECTED NOT DETECTED Final   Klebsiella oxytoca NOT DETECTED NOT DETECTED Final   Klebsiella pneumoniae NOT DETECTED NOT DETECTED Final   Proteus species NOT DETECTED NOT DETECTED Final   Salmonella species NOT DETECTED NOT DETECTED Final   Serratia marcescens NOT DETECTED NOT DETECTED Final   Haemophilus influenzae NOT DETECTED NOT DETECTED Final   Neisseria meningitidis NOT DETECTED NOT DETECTED Final   Pseudomonas aeruginosa NOT  DETECTED NOT DETECTED Final   Stenotrophomonas maltophilia NOT DETECTED NOT DETECTED Final   Candida albicans NOT DETECTED NOT DETECTED Final   Candida auris NOT DETECTED NOT DETECTED Final   Candida glabrata NOT DETECTED NOT DETECTED Final   Candida krusei NOT DETECTED NOT DETECTED Final   Candida parapsilosis NOT DETECTED NOT DETECTED Final   Candida tropicalis NOT DETECTED NOT DETECTED Final   Cryptococcus neoformans/gattii NOT DETECTED NOT DETECTED Final   Meth resistant mecA/C and MREJ DETECTED (A) NOT DETECTED Final    Comment: CRITICAL RESULT CALLED TO, READ BACK BY AND VERIFIED WITH:  C/ PHARMD E. JACKSON 06/23/22 2250 A. LAFRANCE  Performed at [facility], [place] [address] [place], [place] [address]   Blood Culture (routine x 2)     Status: None (Preliminary result)   Collection Time: 06/23/22  4:28 AM   Specimen: BLOOD  Result  Value Ref Range Status   Specimen Description   Final    BLOOD BLOOD RIGHT HAND Performed at [facility], [address] [place], [place] [address]    Special Requests   Final    BOTTLES DRAWN AEROBIC ONLY Blood Culture adequate volume Performed at [facility], [address] [place], [place] [address]    Culture   Final    NO GROWTH 1 DAY Performed at [facility], [address] [place], [place] [address]    Report Status PENDING  Incomplete  Resp Panel by RT-PCR (Flu A&B, Covid) Anterior Nasal Swab     Status: None   Collection Time: 06/23/22  5:01 AM   Specimen: Anterior Nasal Swab  Result Value Ref Range Status   SARS Coronavirus 2 by RT PCR NEGATIVE NEGATIVE Final    Comment: (NOTE) SARS-CoV-2 target nucleic acids are NOT DETECTED.  The SARS-CoV-2 RNA is generally detectable in upper respiratory specimens during the acute phase of infection. The lowest concentration of SARS-CoV-2 viral copies this assay can detect is 138 copies/mL. A negative result does not preclude  SARS-Cov-2 infection and should not be used as the sole basis for treatment or other patient management decisions. A negative result may occur with  improper specimen collection/handling, submission of specimen other than nasopharyngeal swab, presence of viral mutation(s) within the areas targeted by this assay, and inadequate number of viral copies(<138 copies/mL). A negative result must be combined with clinical observations, patient history, and epidemiological information. The expected result is Negative.  Fact Sheet for Patients:  BloggerCourse.com  Fact Sheet for Healthcare Providers:  SeriousBroker.it  This test is no t yet approved or cleared by the [place] FDA and  has been authorized for detection and/or diagnosis of SARS-CoV-2 by FDA under an Emergency Use Authorization (EUA). This EUA will remain  in effect (meaning this test can be used) for the duration of the COVID-19 declaration under Section 564(b)(1) of the Act, 21 U.S.C.section 360bbb-3(b)(1), unless the authorization is terminated  or revoked sooner.       Influenza A by PCR NEGATIVE NEGATIVE Final   Influenza B by PCR NEGATIVE NEGATIVE Final    Comment: (NOTE) The Xpert Xpress SARS-CoV-2/FLU/RSV plus assay is intended as an aid in the diagnosis of influenza from Nasopharyngeal swab specimens and should not be used as a sole basis for treatment. Nasal washings and aspirates are unacceptable for Xpert Xpress SARS-CoV-2/FLU/RSV testing.  Fact Sheet for Patients: BloggerCourse.com  Fact Sheet for Healthcare Providers: SeriousBroker.it  This test is not yet approved or cleared by the [place] FDA and has been authorized for detection and/or diagnosis of SARS-CoV-2 by FDA under an Emergency Use Authorization (EUA). This EUA will remain in effect (meaning this test can be used) for the duration of  the COVID-19 declaration under Section 564(b)(1) of the Act, 21 U.S.C. section 360bbb-3(b)(1), unless the authorization is terminated or revoked.  Performed at [facility], [address], [place], [place] [address]   MRSA Next Gen by PCR, Nasal     Status: Abnormal   Collection Time: 06/23/22  5:53 PM   Specimen: Nasal Mucosa; Nasal Swab  Result Value Ref Range Status   MRSA by PCR Next Gen DETECTED (A) NOT DETECTED Final    Comment: CRITICAL RESULT CALLED TO, READ BACK BY AND VERIFIED WITH: BLOCK,D. 06/23/22 @1908  BY SEEL,M. (NOTE) The GeneXpert MRSA Assay (FDA approved for NASAL specimens only), is one component  of a comprehensive MRSA colonization surveillance program. It is not intended to diagnose MRSA infection nor to guide or monitor treatment for MRSA infections. Test performance is not FDA approved in patients less than 56 years old. Performed at [facility], [address], [place], [place] [address]     Impression/Plan:  1. MSSA bacteremia - with severe sepsis on admission with altered mental status, now improving overall.  Repeat blood culture sent today.  No other new positive growth except for the one bottle.  TTE without concerns.  With his previous history of bacteremia, his substance abuse, deep infection remains a possibility.  I recommend imaging of his lumbar spine and TEE.   I have placed the order a lumbar MRI; alternatively could do a CT scan if MRI not possible I have sent a message to the cardmaster.    2.  E coli in urine - he does cath but no particular symptoms and with #1, I suspect his is contaminate.  Regardless, he has been treated with meropenem and will stop today.   3. Altered mentation - not further altered at this time.

## 2022-06-25 NOTE — Progress Notes (Signed)
Initial Nutrition Assessment  DOCUMENTATION CODES:   Not applicable  INTERVENTION:  -Continue regular diet as tolerated -Provide Ensure Plus HP BID-350kcal, 20g protein/bottle -Provide prosource plus q day-100kcal, 15g protein/packet -Consider vitamin c and zinc supplementation for wound healing as clinically appropriate  NUTRITION DIAGNOSIS:  Increased nutrient needs related to wound healing as evidenced by other (comment) (increased metabolic need for healing).  GOAL:  Patient will meet greater than or equal to 90% of their needs  MONITOR:  PO intake, Supplement acceptance  REASON FOR ASSESSMENT:  Malnutrition Screening Tool   ASSESSMENT:  Pt is a 31yo M with PMH of anxiety, PTSD, schizophrenia, paraplegia, and chronic osteomyelitis who presents with SIRS.  Visited pt at bedside this afternoon. He reports a good appetite and denies recent weight loss, n/v. 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 Discussed ONS to aid in wound healing. Pt is agreeable to Ensure Plus HP (350kcal, 20g protein) BID and Prosource Plus q day (100kcal, 15g protein). Not appropriate for Juven at this time due to hemodynamic instability requiring levophed. Weight has been stable over the last 10 months. Unable to complete NFPE due to positioning at this time.  Medications reviewed and include: protonix,  KCl, senokot-s, zoloft, thiamine, levophed, fentanyl, oxycodone  Labs reviewed: BG:100, Cr:0.47, corrected Ca:8.9  NUTRITION - FOCUSED PHYSICAL EXAM: Unable to assess with position of pt at time of visit  Diet Order:   Diet Order             Diet regular Room service appropriate? Yes; Fluid consistency: Thin  Diet effective now                   EDUCATION NEEDS:  Education needs have been addressed  Skin:  Skin Assessment: Skin Integrity Issues: Skin Integrity Issues:: Incisions, Stage IV Stage IV: left hip Incisions:  knee and foot  Last BM:  06/19/22  Height:  Ht Readings from Last 1 Encounters:  06/23/22 5\' 11"  (1.803 m)   Weight:  Wt Readings from Last 1 Encounters:  06/23/22 72.6 kg   BMI:  Body mass index is 22.32 kg/m.  Estimated Nutritional Needs:  Kcal:  8638-1771HAFB  Protein:  85-110g  Fluid:  >2158mL  Candise Bowens, MS, RD, LDN, CNSC See AMiON for contact information

## 2022-06-25 NOTE — Progress Notes (Signed)
Off pressors x several hours per RN  Plan  - ccm will sign off    SIGNATURE    Dr. Brand Males, M.D., F.C.C.P,  Pulmonary and Critical Care Medicine Staff Physician, [place] Director - Interstitial Lung Disease  Program  Medical Director - [facility] [place] at [place], [place], [address]   Pager: [phone], If no answer  -[place] or Try [phone] Telephone (clinical office): [phone] Telephone (research): [phone]  5:45 PM 06/25/2022

## 2022-06-26 DIAGNOSIS — R6510 Systemic inflammatory response syndrome (SIRS) of non-infectious origin without acute organ dysfunction: Secondary | ICD-10-CM | POA: Diagnosis not present

## 2022-06-26 LAB — COMPREHENSIVE METABOLIC PANEL
ALT: 8 U/L (ref 0–44)
AST: 8 U/L — ABNORMAL LOW (ref 15–41)
Albumin: 2 g/dL — ABNORMAL LOW (ref 3.5–5.0)
Alkaline Phosphatase: 74 U/L (ref 38–126)
Anion gap: 6 (ref 5–15)
BUN: 6 mg/dL (ref 6–20)
CO2: 28 mmol/L (ref 22–32)
Calcium: 7.9 mg/dL — ABNORMAL LOW (ref 8.9–10.3)
Chloride: 104 mmol/L (ref 98–111)
Creatinine, Ser: 0.5 mg/dL — ABNORMAL LOW (ref 0.61–1.24)
GFR, Estimated: >60 mL/min (ref >60)
Glucose, Bld: 103 mg/dL — ABNORMAL HIGH (ref 70–99)
Potassium: 4.3 mmol/L (ref 3.5–5.1)
Sodium: 138 mmol/L (ref 135–145)
Total Bilirubin: 0.3 mg/dL (ref 0.3–1.2)
Total Protein: 6.2 g/dL — ABNORMAL LOW (ref 6.5–8.1)

## 2022-06-26 LAB — CBC
HCT: 27.2 % — ABNORMAL LOW (ref 39.0–52.0)
Hemoglobin: 8.2 g/dL — ABNORMAL LOW (ref 13.0–17.0)
MCH: 26.1 pg (ref 26.0–34.0)
MCHC: 30.1 g/dL (ref 30.0–36.0)
MCV: 86.6 fL (ref 80.0–100.0)
Platelets: 286 10*3/uL (ref 150–400)
RBC: 3.14 MIL/uL — ABNORMAL LOW (ref 4.22–5.81)
RDW: 16.6 % — ABNORMAL HIGH (ref 11.5–15.5)
WBC: 3.5 10*3/uL — ABNORMAL LOW (ref 4.0–10.5)
nRBC: 0 % (ref 0.0–0.2)

## 2022-06-26 LAB — VANCOMYCIN, PEAK: Vancomycin Pk: 31 ug/mL (ref 30–40)

## 2022-06-26 LAB — MAGNESIUM: Magnesium: 2.1 mg/dL (ref 1.7–2.4)

## 2022-06-26 LAB — LACTIC ACID, PLASMA: Lactic Acid, Venous: 1.3 mmol/L (ref 0.5–1.9)

## 2022-06-26 LAB — PHOSPHORUS: Phosphorus: 2.9 mg/dL (ref 2.5–4.6)

## 2022-06-26 NOTE — Progress Notes (Signed)
PROGRESS NOTE  Steve [name] CZY:[id number] DOB: 10-16-1990 DOA: 06/23/2022 PCP: [facility]   LOS: 3 days   Brief Narrative / Interim history: Patient has a past medical history significant for paraplegia, longstanding psychiatric diagnoses to include paranoid schizophrenia, and multisubstance use disorder who presents to the hospital with complaints of lower abdominal pain and lower extremity pain.  He reported fever and chills at home.  He additionally reports an injury to his left foot approximately 3 days prior to admission with some erythema and swelling and tenderness.  He has chronic unstageable decubitus ulcer on the left thigh that has been having more drainage than usual.  He was found to have septic shock due to Staph aureus bacteremia.  ID, general surgery, PCCM consulted  Subjective / 24h Interval events: Complains of neck pain as well as lower back pain.  Assesement and Plan: Principal Problem:   SIRS (systemic inflammatory response syndrome) (HCC) Active Problems:   Amphetamine and psychostimulant-induced psychosis with hallucinations (HCC)   Hyponatremia   Normocytic anemia   Anxiety and depression   Chronic post-traumatic stress disorder (PTSD)   Malnutrition of moderate degree   History of schizophrenia   Chronic multifocal osteomyelitis, left femur (HCC)   Amphetamine abuse (HCC)   Gastroesophageal reflux disease without esophagitis   Tobacco use disorder   Cellulitis of left foot   Thrombocytosis   Unstageable pressure ulcer of left hip (HCC)   Sepsis due to undetermined organism (HCC)   MRSA bacteremia   Acute cystitis without hematuria   Cellulitis   Quadriplegia (HCC)   Principal problem Septic shock due to MRSA bacteremia-blood cultures obtained on 7/7 shows MRSA.  ID consulted, following.  He has been placed on vancomycin, continue for now.  Has been weaned off peripheral Levophed on 10/9, but blood pressure is still intermittently soft.   Keep in stepdown for now -Source may be his chronic decubitus ulcers.  Appreciate ID follow-up.  Due to lower back pain, an MRI of the lumbar spine is pending.  TEE also pending as well   Active problems E. coli bacteriuria -unclear symptoms, has been on meropenem  Amphetamine and psychostimulant-induced psychosis with hallucinations, history of schizophrenia-Long-term.  Psychiatry consulted and evaluated patient, for now continue regimen as below.  Apparently he ran out of his Abilify a week prior to admission, and started having auditory and visual hallucinations.  Now hallucinations are resolved  Hyponatremia-now resolved, sodium normal this morning  Hypokalemia-repleted, potassium now normalized, potassium normal this morning  Normocytic anemia-likely in the setting of infectious process.  Status post 2 unit of packed red blood cells, hemoglobin improved and remained stable  Left hip unstageable pressure ulcer-wound consulted, surgery following as well, no role for surgical intervention, surgery signed off 10/9  Thrombocytosis-Resolved   Cellulitis of left foot -CT of the foot shows just edema no evidence of osteomyelitis. Continue antibiotics as above   Tobacco use disorder -Smoking cessation. Patient was offered and declined nicotine replacement.   Gastroesophageal reflux disease without esophagitis -Continue PPI   Amphetamine abuse (HCC)- TOC referral   Chronic multifocal osteomyelitis, left femur-ID following, appreciate input   Scheduled Meds:  (feeding supplement) PROSource Plus  30 mL Oral Daily   ARIPiprazole  15 mg Oral Daily   Chlorhexidine Gluconate Cloth  6 each Topical Daily   Chlorhexidine Gluconate Cloth  6 each Topical Q0600   feeding supplement  237 mL Oral BID BM   midodrine  5 mg Oral BID WC  mupirocin ointment  1 Application Nasal BID   pantoprazole  40 mg Oral Daily   pregabalin  150 mg Oral BID   senna-docusate  1 tablet Oral BID   sertraline  25 mg  Oral Daily   thiamine (VITAMIN B1) injection  100 mg Intravenous Daily   Continuous Infusions:  sodium chloride 10 mL/hr at 06/26/22 0546   dextrose 5% lactated ringers 75 mL/hr at 06/26/22 1012   vancomycin Stopped (06/26/22 0746)   PRN Meds:.sodium chloride, acetaminophen **OR** acetaminophen, fentaNYL (SUBLIMAZE) injection, ondansetron **OR** ondansetron (ZOFRAN) IV, mouth rinse, oxyCODONE, tiZANidine, traZODone  Current Outpatient Medications  Medication Instructions   acetaminophen (TYLENOL) 650 mg, Oral, 2 times daily PRN   ARIPiprazole (ABILIFY) 15 mg, Oral, Daily   Calcium Carbonate 500 mg, Oral, 2 times daily PRN   midodrine (PROAMATINE) 5 mg, Oral, 2 times daily PRN   mupirocin ointment (BACTROBAN) 2 % 1 Application, Nasal, 2 times daily   OLANZapine (ZYPREXA) 5 mg, Oral, Daily at bedtime   omeprazole (PRILOSEC) 40 mg, Oral, Daily   pregabalin (LYRICA) 150 mg, Oral, 2 times daily   sertraline (ZOLOFT) 25 mg, Oral, Daily   tiZANidine (ZANAFLEX) 4 mg, Oral, At bedtime PRN   traZODone (DESYREL) 50 mg, Oral, Nightly   trospium (SANCTURA) 20 mg, Oral, 2 times daily    Diet Orders (From admission, onward)     Start     Ordered   06/23/22 1153  Diet regular Room service appropriate? Yes; Fluid consistency: Thin  Diet effective now       Question Answer Comment  Room service appropriate? Yes   Fluid consistency: Thin      06/23/22 1152            DVT prophylaxis: SCDs Start: 06/23/22 4098   Lab Results  Component Value Date   PLT 286 06/26/2022      Code Status: Full Code  Family Communication: no family at bedside  Status is: Inpatient  Remains inpatient appropriate because: IV antibiotics, ongoing work-up with MRI of the lumbar spine and TEE pending  Level of care: Stepdown  Consultants:  ID PCCM General surgery Psychiatry  Objective: Vitals:   06/26/22 0835 06/26/22 0840 06/26/22 0900 06/26/22 1000  BP: (!) 119/54  (!) 104/49 (!) 98/44   Pulse:  68 75 69  Resp: Temp:      TempSrc:      SpO2:  98% 96% 97%  Weight:      Height:        Intake/Output Summary (Last 24 hours) at 06/26/2022 1031 Last data filed at 06/26/2022 1014 Gross per 24 hour  Intake 2364.84 ml  Output 5300 ml  Net -2935.16 ml    Wt Readings from Last 3 Encounters:  06/23/22 72.6 kg  05/18/22 59 kg  01/04/22 72.3 kg    Examination:  Constitutional: NAD Eyes: lids and conjunctivae normal, no scleral icterus ENMT: mmm Neck: normal, supple Respiratory: clear to auscultation bilaterally, no wheezing, no crackles. Normal respiratory effort.  Cardiovascular: Regular rate and rhythm, no murmurs / rubs / gallops. No LE edema. Abdomen: soft, no distention, no tenderness. Bowel sounds positive.  Skin: no rashes  Data Reviewed: I have independently reviewed following labs and imaging studies   CBC Recent Labs  Lab 06/23/22 0423 06/23/22 1604 06/23/22 1728 06/23/22 1736 06/24/22 0044 06/24/22 0832 06/24/22 1659 06/25/22 0822 06/26/22 0308  WBC 9.6   < > 6.7  --  5.1  --  3.9* 4.6 3.5*  HGB 7.9*   < > 6.3*  --  6.7* 7.7* 7.9* 7.9* 8.2*  HCT 25.6*   < > 21.2*  --  22.5* 24.5* 26.1* 25.6* 27.2*  PLT 462*   < > 352 311 287  --  284 347 286  MCV 83.4   < > 85.8  --  86.2  --  86.1 85.6 86.6  MCH 25.7*   < > 25.5*  --  25.7*  --  26.1 26.4 26.1  MCHC 30.9   < > 29.7*  --  29.8*  --  30.3 30.9 30.1  RDW 16.8*   < > 16.8*  --  16.5*  --  16.3* 16.3* 16.6*  LYMPHSABS 1.7  --   --   --   --   --  1.0 1.3  --   MONOABS 0.7  --   --   --   --   --  0.4 0.4  --   EOSABS 0.2  --   --   --   --   --  0.2 0.4  --   BASOSABS 0.0  --   --   --   --   --  0.0 0.0  --    < > = values in this interval not displayed.     Recent Labs  Lab 06/23/22 0423 06/23/22 0650 06/23/22 1604 06/23/22 1736 06/24/22 0044 06/24/22 1659 06/25/22 0822 06/26/22 0256 06/26/22 0308  NA 134*  --  134*  --  136 135 137  --  138  K 3.6  --  3.5  --   3.3* 3.4* 4.0  --  4.3  CL 102  --  105  --  107 106 106  --  104  CO2 24  --  23  --  24 24 26   --  28  GLUCOSE 115*  --  98  --  88 101* 100*  --  103*  BUN 13  --  11  --  8 8 6   --  6  CREATININE 0.73  --  0.67  --  0.47* 0.59* 0.47*  --  0.50*  CALCIUM 8.4*  --  7.8*  --  7.8* 7.3* 7.7*  --  7.9*  AST 13*  --   --   --  6* 8* 8*  --  8*  ALT 11  --   --   --  8 8 8   --  8  ALKPHOS 96  --   --   --  76 67 70  --  74  BILITOT 0.5  --   --   --  0.9 0.4 0.4  --  0.3  ALBUMIN 2.5*  --   --   --  2.0* 1.8* 2.0*  --  2.0*  MG  --   --  1.7  --   --   --  1.8  --  2.1  DDIMER  --   --   --  1.80*  --   --   --   --   --   LATICACIDVEN 1.9 0.9 1.3 0.7  --   --   --  1.3  --   INR 1.2  --   --  1.3*  --   --   --   --   --   AMMONIA  --   --   --  21  --   --   --   --   --      ------------------------------------------------------------------------------------------------------------------  No results for input(s): "CHOL", "HDL", "LDLCALC", "TRIG", "CHOLHDL", "LDLDIRECT" in the last 72 hours.  Lab Results  Component Value Date   HGBA1C 4.6 (L) 01/05/2022   ------------------------------------------------------------------------------------------------------------------ No results for input(s): "TSH", "T4TOTAL", "T3FREE", "THYROIDAB" in the last 72 hours.  Invalid input(s): "FREET3"  Cardiac Enzymes No results for input(s): "CKMB", "TROPONINI", "MYOGLOBIN" in the last 168 hours.  Invalid input(s): "CK" ------------------------------------------------------------------------------------------------------------------ No results found for: "BNP"  CBG: Recent Labs  Lab 06/24/22 0816  GLUCAP 103*     Recent Results (from the past 240 hour(s))  Blood Culture (routine x 2)     Status: Abnormal   Collection Time: 06/23/22  4:23 AM   Specimen: BLOOD  Result Value Ref Range Status   Specimen Description   Final    BLOOD RIGHT ANTECUBITAL Performed at [facility], [address] [place], [place] [address]    Special Requests   Final    BOTTLES DRAWN AEROBIC AND ANAEROBIC Blood Culture results may not be optimal due to an excessive volume of blood received in culture bottles Performed at [facility], [address] [place], [place] [address]    Culture  Setup Time   Final    AEROBIC BOTTLE ONLY GRAM POSITIVE COCCI IN CLUSTERS CRITICAL RESULT CALLED TO, READ BACK BY AND VERIFIED WITH:  C/ PHARMD E. JACKSON 06/23/22 2250 A. LAFRANCE  Performed at [facility], [address] [place], [place] [address]    Culture METHICILLIN RESISTANT STAPHYLOCOCCUS AUREUS (A)  Final   Report Status 06/25/2022 FINAL  Final   Organism ID, Bacteria METHICILLIN RESISTANT STAPHYLOCOCCUS AUREUS  Final      Susceptibility   Methicillin resistant staphylococcus aureus - MIC*    CIPROFLOXACIN >=8 RESISTANT Resistant     ERYTHROMYCIN >=8 RESISTANT Resistant     GENTAMICIN <=0.5 SENSITIVE Sensitive     OXACILLIN >=4 RESISTANT Resistant     TETRACYCLINE >=16 RESISTANT Resistant     VANCOMYCIN 1 SENSITIVE Sensitive     TRIMETH/SULFA >=320 RESISTANT Resistant     CLINDAMYCIN RESISTANT Resistant     RIFAMPIN <=0.5 SENSITIVE Sensitive     Inducible Clindamycin POSITIVE Resistant     * METHICILLIN RESISTANT STAPHYLOCOCCUS AUREUS  Urine Culture     Status: Abnormal   Collection Time: 06/23/22  4:23 AM   Specimen: In/Out Cath Urine  Result Value Ref Range Status   Specimen Description   Final    IN/OUT CATH URINE Performed at [facility], [address] [place], [place] [address]    Special Requests   Final    Normal Performed at [facility], [address] [place], [place] [address]    Culture >=100,000 COLONIES/mL ESCHERICHIA COLI (A)  Final   Report Status 06/25/2022 FINAL  Final   Organism ID, Bacteria ESCHERICHIA COLI (A)  Final      Susceptibility   Escherichia coli - MIC*     AMPICILLIN <=2 SENSITIVE Sensitive     CEFAZOLIN <=4 SENSITIVE Sensitive     CEFEPIME <=0.12 SENSITIVE Sensitive     CEFTRIAXONE <=0.25 SENSITIVE Sensitive     CIPROFLOXACIN >=4 RESISTANT Resistant     GENTAMICIN <=1 SENSITIVE Sensitive     IMIPENEM <=0.25 SENSITIVE Sensitive     NITROFURANTOIN <=16 SENSITIVE Sensitive     TRIMETH/SULFA >=320 RESISTANT Resistant     AMPICILLIN/SULBACTAM <=2 SENSITIVE Sensitive     PIP/TAZO <=4 SENSITIVE Sensitive     * >=100,000 COLONIES/mL  ESCHERICHIA COLI  Blood Culture ID Panel (Reflexed)     Status: Abnormal   Collection Time: 06/23/22  4:23 AM  Result Value Ref Range Status   Enterococcus faecalis NOT DETECTED NOT DETECTED Final   Enterococcus Faecium NOT DETECTED NOT DETECTED Final   Listeria monocytogenes NOT DETECTED NOT DETECTED Final   Staphylococcus species DETECTED (A) NOT DETECTED Final    Comment: CRITICAL RESULT CALLED TO, READ BACK BY AND VERIFIED WITH:  C/ PHARMD E. JACKSON 06/23/22 2250 A. LAFRANCE     Staphylococcus aureus (BCID) DETECTED (A) NOT DETECTED Final    Comment: Methicillin (oxacillin)-resistant Staphylococcus aureus (MRSA). MRSA is predictably resistant to beta-lactam antibiotics (except ceftaroline). Preferred therapy is vancomycin unless clinically contraindicated. Patient requires contact precautions if  hospitalized. CRITICAL RESULT CALLED TO, READ BACK BY AND VERIFIED WITH:  C/ PHARMD E. JACKSON 06/23/22 2250 A. LAFRANCE     Staphylococcus epidermidis NOT DETECTED NOT DETECTED Final   Staphylococcus lugdunensis NOT DETECTED NOT DETECTED Final   Streptococcus species NOT DETECTED NOT DETECTED Final   Streptococcus agalactiae NOT DETECTED NOT DETECTED Final   Streptococcus pneumoniae NOT DETECTED NOT DETECTED Final   Streptococcus pyogenes NOT DETECTED NOT DETECTED Final   A.calcoaceticus-baumannii NOT DETECTED NOT DETECTED Final   Bacteroides fragilis NOT DETECTED NOT DETECTED Final   Enterobacterales NOT  DETECTED NOT DETECTED Final   Enterobacter cloacae complex NOT DETECTED NOT DETECTED Final   Escherichia coli NOT DETECTED NOT DETECTED Final   Klebsiella aerogenes NOT DETECTED NOT DETECTED Final   Klebsiella oxytoca NOT DETECTED NOT DETECTED Final   Klebsiella pneumoniae NOT DETECTED NOT DETECTED Final   Proteus species NOT DETECTED NOT DETECTED Final   Salmonella species NOT DETECTED NOT DETECTED Final   Serratia marcescens NOT DETECTED NOT DETECTED Final   Haemophilus influenzae NOT DETECTED NOT DETECTED Final   Neisseria meningitidis NOT DETECTED NOT DETECTED Final   Pseudomonas aeruginosa NOT DETECTED NOT DETECTED Final   Stenotrophomonas maltophilia NOT DETECTED NOT DETECTED Final   Candida albicans NOT DETECTED NOT DETECTED Final   Candida auris NOT DETECTED NOT DETECTED Final   Candida glabrata NOT DETECTED NOT DETECTED Final   Candida krusei NOT DETECTED NOT DETECTED Final   Candida parapsilosis NOT DETECTED NOT DETECTED Final   Candida tropicalis NOT DETECTED NOT DETECTED Final   Cryptococcus neoformans/gattii NOT DETECTED NOT DETECTED Final   Meth resistant mecA/C and MREJ DETECTED (A) NOT DETECTED Final    Comment: CRITICAL RESULT CALLED TO, READ BACK BY AND VERIFIED WITH:  C/ PHARMD E. JACKSON 06/23/22 2250 A. LAFRANCE  Performed at [facility], [address], [place], [place] [address]   Blood Culture (routine x 2)     Status: None (Preliminary result)   Collection Time: 06/23/22  4:28 AM   Specimen: BLOOD  Result Value Ref Range Status   Specimen Description   Final    BLOOD BLOOD RIGHT HAND Performed at [facility], [name] [place], [place] [address]    Special Requests   Final    BOTTLES DRAWN AEROBIC ONLY Blood Culture adequate volume Performed at [facility], [address] [place], [place] [address]    Culture   Final    NO GROWTH 2 DAYS Performed at [facility], [address],  [place], [place] [address]    Report Status PENDING  Incomplete  Resp Panel by RT-PCR (Flu A&B, Covid) Anterior Nasal Swab     Status: None  Collection Time: 06/23/22  5:01 AM   Specimen: Anterior Nasal Swab  Result Value Ref Range Status   SARS Coronavirus 2 by RT PCR NEGATIVE NEGATIVE Final    Comment: (NOTE) SARS-CoV-2 target nucleic acids are NOT DETECTED.  The SARS-CoV-2 RNA is generally detectable in upper respiratory specimens during the acute phase of infection. The lowest concentration of SARS-CoV-2 viral copies this assay can detect is 138 copies/mL. A negative result does not preclude SARS-Cov-2 infection and should not be used as the sole basis for treatment or other patient management decisions. A negative result may occur with  improper specimen collection/handling, submission of specimen other than nasopharyngeal swab, presence of viral mutation(s) within the areas targeted by this assay, and inadequate number of viral copies(<138 copies/mL). A negative result must be combined with clinical observations, patient history, and epidemiological information. The expected result is Negative.  Fact Sheet for Patients:  BloggerCourse.com  Fact Sheet for Healthcare Providers:  SeriousBroker.it  This test is no t yet approved or cleared by the [place] FDA and  has been authorized for detection and/or diagnosis of SARS-CoV-2 by FDA under an Emergency Use Authorization (EUA). This EUA will remain  in effect (meaning this test can be used) for the duration of the COVID-19 declaration under Section 564(b)(1) of the Act, 21 U.S.C.section 360bbb-3(b)(1), unless the authorization is terminated  or revoked sooner.       Influenza A by PCR NEGATIVE NEGATIVE Final   Influenza B by PCR NEGATIVE NEGATIVE Final    Comment: (NOTE) The Xpert Xpress SARS-CoV-2/FLU/RSV plus assay is intended as an aid in the diagnosis of influenza from  Nasopharyngeal swab specimens and should not be used as a sole basis for treatment. Nasal washings and aspirates are unacceptable for Xpert Xpress SARS-CoV-2/FLU/RSV testing.  Fact Sheet for Patients: BloggerCourse.com  Fact Sheet for Healthcare Providers: SeriousBroker.it  This test is not yet approved or cleared by the [place] FDA and has been authorized for detection and/or diagnosis of SARS-CoV-2 by FDA under an Emergency Use Authorization (EUA). This EUA will remain in effect (meaning this test can be used) for the duration of the COVID-19 declaration under Section 564(b)(1) of the Act, 21 U.S.C. section 360bbb-3(b)(1), unless the authorization is terminated or revoked.  Performed at [facility], [address], [place], [place] [address]   MRSA Next Gen by PCR, Nasal     Status: Abnormal   Collection Time: 06/23/22  5:53 PM   Specimen: Nasal Mucosa; Nasal Swab  Result Value Ref Range Status   MRSA by PCR Next Gen DETECTED (A) NOT DETECTED Final    Comment: CRITICAL RESULT CALLED TO, READ BACK BY AND VERIFIED WITH: BLOCK,D. 06/23/22 @1908  BY SEEL,M. (NOTE) The GeneXpert MRSA Assay (FDA approved for NASAL specimens only), is one component of a comprehensive MRSA colonization surveillance program. It is not intended to diagnose MRSA infection nor to guide or monitor treatment for MRSA infections. Test performance is not FDA approved in patients less than 78 years old. Performed at [facility], [address], [place], [place]   Culture, blood (Routine X 2) w Reflex to ID Panel     Status: None (Preliminary result)   Collection Time: 06/25/22  6:49 AM   Specimen: BLOOD  Result Value Ref Range Status   Specimen Description   Final    BLOOD Blood Culture results may not be optimal due to an inadequate volume of blood received in culture bottles Performed  at [facility], [address] [place], [place] [address]    Special Requests   Final    BLOOD RIGHT HAND AEROBIC BOTTLE ONLY Performed at [place] [address], [place], [place] [address]    Culture   Final    NO GROWTH < 12 HOURS Performed at [facility] [address] [place], [place] [address]    Report Status PENDING  Incomplete     Radiology Studies: No results found.   Marzetta Board, MD, PhD [facility]  Between 7 am - 7 pm I am available, please contact me via Amion (for emergencies) or Securechat (non urgent messages)  Between 7 pm - 7 am I am not available, please contact night coverage MD/APP via Amion

## 2022-06-26 NOTE — Progress Notes (Addendum)
Patient refuses Prevalon boots. Patient was educated on the need for Prevalon boots. Will continue to educate.

## 2022-06-26 NOTE — TOC Progression Note (Signed)
Transition of Care [facility]) - Progression Note    Patient Details  Name: Steve [name] MRN: [id number] Date of Birth: 12-Aug-1991  [facility] CM/SW Contact  Purcell Mouton, RN Phone Number: 06/26/2022, 3:36 PM  Clinical Narrative:      Transition of Care (TOC) Screening Note   Patient Details  Name: Steve [name] Date of Birth: 04-05-1991   [facility] CM/SW Contact:    Purcell Mouton, RN Phone Number: 06/26/2022, 3:36 PM    Transition of Care Department [facility]) has reviewed patient and no TOC needs have been identified at this time. We will continue to monitor patient advancement through interdisciplinary progression rounds. If new patient transition needs arise, please place a TOC consult.         Expected Discharge Plan and Services                                                 Social Determinants of Health (SDOH) Interventions    Readmission Risk Interventions     No data to display

## 2022-06-26 NOTE — Progress Notes (Signed)
[place] [facility] for Infectious Disease   Reason for visit: Follow up on bacteremia  Interval History: repeat blood culture ngtd.   Day 5 total antibiotics  Physical Exam: Constitutional:  Vitals:   06/26/22 1253 06/26/22 1300  BP: (!) 103/45 (!) 124/57  Pulse: 89 91  Resp: 15 (!) 25  Temp:    SpO2: 97% 96%   patient appears in NAD Respiratory: Normal respiratory effort  Review of Systems: Integument/breast: negative for rash  Lab Results  Component Value Date   WBC 3.5 (L) 06/26/2022   HGB 8.2 (L) 06/26/2022   HCT 27.2 (L) 06/26/2022   MCV 86.6 06/26/2022   PLT 286 06/26/2022    Lab Results  Component Value Date   CREATININE 0.50 (L) 06/26/2022   BUN 6 06/26/2022   NA 138 06/26/2022   K 4.3 06/26/2022   CL 104 06/26/2022   CO2 28 06/26/2022    Lab Results  Component Value Date   ALT 8 06/26/2022   AST 8 (L) 06/26/2022   ALKPHOS 74 06/26/2022     Microbiology: Recent Results (from the past 240 hour(s))  Blood Culture (routine x 2)     Status: Abnormal   Collection Time: 06/23/22  4:23 AM   Specimen: BLOOD  Result Value Ref Range Status   Specimen Description   Final    BLOOD RIGHT ANTECUBITAL Performed at [facility], [place] [address] [place], [place] [address]    Special Requests   Final    BOTTLES DRAWN AEROBIC AND ANAEROBIC Blood Culture results may not be optimal due to an excessive volume of blood received in culture bottles Performed at [place] [facility] [address], [place], [place] [address]    Culture  Setup Time   Final    AEROBIC BOTTLE ONLY GRAM POSITIVE COCCI IN CLUSTERS CRITICAL RESULT CALLED TO, READ BACK BY AND VERIFIED WITH:  C/ PHARMD E. JACKSON 06/23/22 2250 A. LAFRANCE  Performed at [facility], [place] [address] [place], [place] [address]    Culture METHICILLIN RESISTANT STAPHYLOCOCCUS AUREUS (A)  Final   Report Status 06/25/2022 FINAL  Final   Organism ID, Bacteria METHICILLIN  RESISTANT STAPHYLOCOCCUS AUREUS  Final      Susceptibility   Methicillin resistant staphylococcus aureus - MIC*    CIPROFLOXACIN >=8 RESISTANT Resistant     ERYTHROMYCIN >=8 RESISTANT Resistant     GENTAMICIN <=0.5 SENSITIVE Sensitive     OXACILLIN >=4 RESISTANT Resistant     TETRACYCLINE >=16 RESISTANT Resistant     VANCOMYCIN 1 SENSITIVE Sensitive     TRIMETH/SULFA >=320 RESISTANT Resistant     CLINDAMYCIN RESISTANT Resistant     RIFAMPIN <=0.5 SENSITIVE Sensitive     Inducible Clindamycin POSITIVE Resistant     * METHICILLIN RESISTANT STAPHYLOCOCCUS AUREUS  Urine Culture     Status: Abnormal   Collection Time: 06/23/22  4:23 AM   Specimen: In/Out Cath Urine  Result Value Ref Range Status   Specimen Description   Final    IN/OUT CATH URINE Performed at [facility] [address], [place], [place] [address]    Special Requests   Final    Normal Performed at [facility], [place] [address] [place],  [address]    Culture >=100,000 COLONIES/mL ESCHERICHIA COLI (A)  Final   Report Status 06/25/2022 FINAL  Final   Organism ID, Bacteria ESCHERICHIA COLI (A)  Final      Susceptibility  Escherichia coli - MIC*    AMPICILLIN <=2 SENSITIVE Sensitive     CEFAZOLIN <=4 SENSITIVE Sensitive     CEFEPIME <=0.12 SENSITIVE Sensitive     CEFTRIAXONE <=0.25 SENSITIVE Sensitive     CIPROFLOXACIN >=4 RESISTANT Resistant     GENTAMICIN <=1 SENSITIVE Sensitive     IMIPENEM <=0.25 SENSITIVE Sensitive     NITROFURANTOIN <=16 SENSITIVE Sensitive     TRIMETH/SULFA >=320 RESISTANT Resistant     AMPICILLIN/SULBACTAM <=2 SENSITIVE Sensitive     PIP/TAZO <=4 SENSITIVE Sensitive     * >=100,000 COLONIES/mL ESCHERICHIA COLI  Blood Culture ID Panel (Reflexed)     Status: Abnormal   Collection Time: 06/23/22  4:23 AM  Result Value Ref Range Status   Enterococcus faecalis NOT DETECTED NOT DETECTED Final   Enterococcus Faecium NOT DETECTED NOT DETECTED Final    Listeria monocytogenes NOT DETECTED NOT DETECTED Final   Staphylococcus species DETECTED (A) NOT DETECTED Final    Comment: CRITICAL RESULT CALLED TO, READ BACK BY AND VERIFIED WITH:  C/ PHARMD E. JACKSON 06/23/22 2250 A. LAFRANCE     Staphylococcus aureus (BCID) DETECTED (A) NOT DETECTED Final    Comment: Methicillin (oxacillin)-resistant Staphylococcus aureus (MRSA). MRSA is predictably resistant to beta-lactam antibiotics (except ceftaroline). Preferred therapy is vancomycin unless clinically contraindicated. Patient requires contact precautions if  hospitalized. CRITICAL RESULT CALLED TO, READ BACK BY AND VERIFIED WITH:  C/ PHARMD E. JACKSON 06/23/22 2250 A. LAFRANCE     Staphylococcus epidermidis NOT DETECTED NOT DETECTED Final   Staphylococcus lugdunensis NOT DETECTED NOT DETECTED Final   Streptococcus species NOT DETECTED NOT DETECTED Final   Streptococcus agalactiae NOT DETECTED NOT DETECTED Final   Streptococcus pneumoniae NOT DETECTED NOT DETECTED Final   Streptococcus pyogenes NOT DETECTED NOT DETECTED Final   A.calcoaceticus-baumannii NOT DETECTED NOT DETECTED Final   Bacteroides fragilis NOT DETECTED NOT DETECTED Final   Enterobacterales NOT DETECTED NOT DETECTED Final   Enterobacter cloacae complex NOT DETECTED NOT DETECTED Final   Escherichia coli NOT DETECTED NOT DETECTED Final   Klebsiella aerogenes NOT DETECTED NOT DETECTED Final   Klebsiella oxytoca NOT DETECTED NOT DETECTED Final   Klebsiella pneumoniae NOT DETECTED NOT DETECTED Final   Proteus species NOT DETECTED NOT DETECTED Final   Salmonella species NOT DETECTED NOT DETECTED Final   Serratia marcescens NOT DETECTED NOT DETECTED Final   Haemophilus influenzae NOT DETECTED NOT DETECTED Final   Neisseria meningitidis NOT DETECTED NOT DETECTED Final   Pseudomonas aeruginosa NOT DETECTED NOT DETECTED Final   Stenotrophomonas maltophilia NOT DETECTED NOT DETECTED Final   Candida albicans NOT DETECTED NOT DETECTED  Final   Candida auris NOT DETECTED NOT DETECTED Final   Candida glabrata NOT DETECTED NOT DETECTED Final   Candida krusei NOT DETECTED NOT DETECTED Final   Candida parapsilosis NOT DETECTED NOT DETECTED Final   Candida tropicalis NOT DETECTED NOT DETECTED Final   Cryptococcus neoformans/gattii NOT DETECTED NOT DETECTED Final   Meth resistant mecA/C and MREJ DETECTED (A) NOT DETECTED Final    Comment: CRITICAL RESULT CALLED TO, READ BACK BY AND VERIFIED WITH:  C/ PHARMD E. JACKSON 06/23/22 2250 A. LAFRANCE  Performed at [facility], [place] [address], [place], [place] [address]   Blood Culture (routine x 2)     Status: None (Preliminary result)   Collection Time: 06/23/22  4:28 AM   Specimen: BLOOD  Result Value Ref Range Status   Specimen Description   Final    BLOOD BLOOD RIGHT HAND Performed  at [facility], [place] [address] [place], [place] [address]    Special Requests   Final    BOTTLES DRAWN AEROBIC ONLY Blood Culture adequate volume Performed at [place] [address], [place], [place] [address]    Culture   Final    NO GROWTH 3 DAYS Performed at [facility], [place] [address] [place], [place] [address]    Report Status PENDING  Incomplete  Resp Panel by RT-PCR (Flu A&B, Covid) Anterior Nasal Swab     Status: None   Collection Time: 06/23/22  5:01 AM   Specimen: Anterior Nasal Swab  Result Value Ref Range Status   SARS Coronavirus 2 by RT PCR NEGATIVE NEGATIVE Final    Comment: (NOTE) SARS-CoV-2 target nucleic acids are NOT DETECTED.  The SARS-CoV-2 RNA is generally detectable in upper respiratory specimens during the acute phase of infection. The lowest concentration of SARS-CoV-2 viral copies this assay can detect is 138 copies/mL. A negative result does not preclude SARS-Cov-2 infection and should not be used as the sole basis for treatment or other patient management decisions. A negative result may occur with   improper specimen collection/handling, submission of specimen other than nasopharyngeal swab, presence of viral mutation(s) within the areas targeted by this assay, and inadequate number of viral copies(<138 copies/mL). A negative result must be combined with clinical observations, patient history, and epidemiological information. The expected result is Negative.  Fact Sheet for Patients:  EntrepreneurPulse.com.au  Fact Sheet for Healthcare Providers:  IncredibleEmployment.be  This test is no t yet approved or cleared by the [place] FDA and  has been authorized for detection and/or diagnosis of SARS-CoV-2 by FDA under an Emergency Use Authorization (EUA). This EUA will remain  in effect (meaning this test can be used) for the duration of the COVID-19 declaration under Section 564(b)(1) of the Act, 21 U.S.C.section 360bbb-3(b)(1), unless the authorization is terminated  or revoked sooner.       Influenza A by PCR NEGATIVE NEGATIVE Final   Influenza B by PCR NEGATIVE NEGATIVE Final    Comment: (NOTE) The Xpert Xpress SARS-CoV-2/FLU/RSV plus assay is intended as an aid in the diagnosis of influenza from Nasopharyngeal swab specimens and should not be used as a sole basis for treatment. Nasal washings and aspirates are unacceptable for Xpert Xpress SARS-CoV-2/FLU/RSV testing.  Fact Sheet for Patients: EntrepreneurPulse.com.au  Fact Sheet for Healthcare Providers: IncredibleEmployment.be  This test is not yet approved or cleared by the [place] FDA and has been authorized for detection and/or diagnosis of SARS-CoV-2 by FDA under an Emergency Use Authorization (EUA). This EUA will remain in effect (meaning this test can be used) for the duration of the COVID-19 declaration under Section 564(b)(1) of the Act, 21 U.S.C. section 360bbb-3(b)(1), unless the authorization is terminated  or revoked.  Performed at [facility], [place] [address] [place], [place] [address]   MRSA Next Gen by PCR, Nasal     Status: Abnormal   Collection Time: 06/23/22  5:53 PM   Specimen: Nasal Mucosa; Nasal Swab  Result Value Ref Range Status   MRSA by PCR Next Gen DETECTED (A) NOT DETECTED Final    Comment: CRITICAL RESULT CALLED TO, READ BACK BY AND VERIFIED WITH: BLOCK,D. 06/23/22 @1908  BY SEEL,M. (NOTE) The GeneXpert MRSA Assay (FDA approved for NASAL specimens only), is one component of a comprehensive MRSA colonization surveillance program. It is not intended to diagnose MRSA infection nor to guide or  monitor treatment for MRSA infections. Test performance is not FDA approved in patients less than 56 years old. Performed at [facility], [place] [address], [place], [place] [address]   Culture, blood (Routine X 2) w Reflex to ID Panel     Status: None (Preliminary result)   Collection Time: 06/25/22  6:49 AM   Specimen: BLOOD  Result Value Ref Range Status   Specimen Description   Final    BLOOD Blood Culture results may not be optimal due to an inadequate volume of blood received in culture bottles Performed at [facility], [place] [address], [place], [place] [address]    Special Requests   Final    BLOOD RIGHT HAND AEROBIC BOTTLE ONLY Performed at [place] [facility] [address] [place], [place] [address]    Culture   Final    NO GROWTH 1 DAY Performed at [facility], [place] [address], [place], [place] [address]    Report Status PENDING  Incomplete    Impression/Plan:  1. MRSA bacteremia - 1/3 blood cultures bottles positive.  Repeat ngtd.  TEE requested.  I mainly suspect his open ulcer and known osteomyelitis.  Will treat him for a prolonged period and if TEE negative, will use oral therapy.    2.  Fever - fever curve has trended down to normal.   3.  Medication monitoring - creat remains  without elevation.

## 2022-06-27 DIAGNOSIS — R579 Shock, unspecified: Secondary | ICD-10-CM

## 2022-06-27 DIAGNOSIS — R6510 Systemic inflammatory response syndrome (SIRS) of non-infectious origin without acute organ dysfunction: Secondary | ICD-10-CM | POA: Diagnosis not present

## 2022-06-27 LAB — CBC
HCT: 26.9 % — ABNORMAL LOW (ref 39.0–52.0)
Hemoglobin: 7.9 g/dL — ABNORMAL LOW (ref 13.0–17.0)
MCH: 26.1 pg (ref 26.0–34.0)
MCHC: 29.4 g/dL — ABNORMAL LOW (ref 30.0–36.0)
MCV: 88.8 fL (ref 80.0–100.0)
Platelets: 310 10*3/uL (ref 150–400)
RBC: 3.03 MIL/uL — ABNORMAL LOW (ref 4.22–5.81)
RDW: 17.1 % — ABNORMAL HIGH (ref 11.5–15.5)
WBC: 3.3 10*3/uL — ABNORMAL LOW (ref 4.0–10.5)
nRBC: 0 % (ref 0.0–0.2)

## 2022-06-27 LAB — BPAM RBC
Blood Product Expiration Date: 202310172359
Blood Product Expiration Date: 202310292359
Blood Product Expiration Date: 202310302359
ISSUE DATE / TIME: 202310072104
ISSUE DATE / TIME: 202310080235
Unit Type and Rh: 6200
Unit Type and Rh: 6200
Unit Type and Rh: 6200

## 2022-06-27 LAB — TYPE AND SCREEN
ABO/RH(D): A POS
Antibody Screen: NEGATIVE
Unit division: 0
Unit division: 0
Unit division: 0

## 2022-06-27 LAB — COMPREHENSIVE METABOLIC PANEL
ALT: 8 U/L (ref 0–44)
AST: 9 U/L — ABNORMAL LOW (ref 15–41)
Albumin: 2 g/dL — ABNORMAL LOW (ref 3.5–5.0)
Alkaline Phosphatase: 77 U/L (ref 38–126)
Anion gap: 4 — ABNORMAL LOW (ref 5–15)
BUN: 8 mg/dL (ref 6–20)
CO2: 28 mmol/L (ref 22–32)
Calcium: 7.8 mg/dL — ABNORMAL LOW (ref 8.9–10.3)
Chloride: 106 mmol/L (ref 98–111)
Creatinine, Ser: 0.46 mg/dL — ABNORMAL LOW (ref 0.61–1.24)
GFR, Estimated: >60 mL/min (ref >60)
Glucose, Bld: 90 mg/dL (ref 70–99)
Potassium: 4.1 mmol/L (ref 3.5–5.1)
Sodium: 138 mmol/L (ref 135–145)
Total Bilirubin: 0.4 mg/dL (ref 0.3–1.2)
Total Protein: 5.9 g/dL — ABNORMAL LOW (ref 6.5–8.1)

## 2022-06-27 LAB — MAGNESIUM: Magnesium: 2 mg/dL (ref 1.7–2.4)

## 2022-06-27 LAB — VANCOMYCIN, TROUGH: Vancomycin Tr: 18 ug/mL (ref 15–20)

## 2022-06-27 MED ORDER — NOREPINEPHRINE 4 MG/250ML-% IV SOLN: 0.0000 ug/min | INTRAVENOUS | Status: DC

## 2022-06-27 MED ORDER — SODIUM CHLORIDE 0.9 % IV BOLUS
500.0000 mL | Freq: Once | INTRAVENOUS | Status: AC
  Administered 2022-06-27: 500 mL via INTRAVENOUS

## 2022-06-27 MED ORDER — THIAMINE MONONITRATE 100 MG PO TABS
100.0000 mg | ORAL_TABLET | Freq: Every day | ORAL | Status: DC
  Administered 2022-06-28 – 2022-07-01 (×4): 100 mg via ORAL
  Filled 2022-06-27 (×4): qty 1

## 2022-06-27 MED ORDER — NOREPINEPHRINE 4 MG/250ML-% IV SOLN: 2.0000 ug/min | INTRAVENOUS | Status: DC

## 2022-06-27 MED ORDER — VANCOMYCIN HCL 1250 MG/250ML IV SOLN
1250.0000 mg | Freq: Two times a day (BID) | INTRAVENOUS | Status: DC
  Administered 2022-06-27 – 2022-06-30 (×6): 1250 mg via INTRAVENOUS
  Filled 2022-06-27 (×6): qty 250

## 2022-06-27 MED ORDER — NOREPINEPHRINE 4 MG/250ML-% IV SOLN
INTRAVENOUS | Status: AC
  Administered 2022-06-27: 3 ug/min via INTRAVENOUS
  Filled 2022-06-27: qty 250

## 2022-06-27 NOTE — Progress Notes (Signed)
NAME:  Steve [name], MRN:  [id number], DOB:  [birth year], LOS: 4 ADMISSION DATE:  06/23/2022, CONSULTATION DATE:  06/23/22 REFERRING MD:  Robb Matar, CHIEF COMPLAINT:  leg pain   BRIEF  31yM with history of anxiety, depression, schizophrenia, paraplegia - does in/out cath at home, PTSD who was brough in by EMS for worsening leg spasms over last couple of weeks. Much of history is obtained through chart review as he is relatively lethargic at time of my interview. He has had chills, night sweats, thinks he's had fever over similar time frame. He also says he injured his left foot about 3 days PTA and it's now become erythematous, warm and tender.   In ED he was initially normotensive but later febrile, hypotensive (though with somewhat variable pressures when cuff site switched arm to arm), lethargic (though had also been given low dose ativan/fentanyl in attempt to facilitate imaging  due to his anxiety/agitation, in addition to robaxin as well as home abilify, lyrica), concern for sepsis due to acute on chronic osteomyelitis of unstageable left thigh pressure wound, SSTI of LLE, and/or UTI.  He was started on vanc/cefepime and given 4250cc crystalloid, then started on low dose levophed peripherally.   Pertinent  Medical History  Anxiety Depression PTSD Paraplegia Schizophrenia Anemia  Significant Hospital Events: Including procedures, antibiotic start and stop dates in addition to other pertinent events   10/7 admitted, sepsis bolus +, low dose levo, started on vanc/meropenem MRSA PCR - POSITIVE Blood cuolture - STaph aureus - sensit pending BCID positive Urine culture > 100K  E Colii 10/8  - Transfused 2U pRBC as Hb drifted following 4.5L fluid resuscitation, remains on low dose levophed. 06/25/22 - 99% room air. On levophed . On vanc and merrem. Afebrile now WBC dropped to 3.9K. Urine culture with > $100K e colii and Blood culture with STaph aureus. More awake. IV team could not get blood  drawn 06/26/22 - ccm signed off  Interim History / Subjective:   06/27/22 - back on levophed and ccm recalled but he is on room air and sitting on bed while on levophed and says he is felling better. Cards coniderteing TEE for MRSA in blood . REpeat cultres negative. D6 atnibiotic today. Source open ulcer and known osteo  Objective   Blood pressure (!) 73/27, pulse 69, temperature 98 F (36.7 C), temperature source Oral, resp. rate 14, height 5\' 11"  (1.803 m), weight 72.6 kg, SpO2 97 %.        Intake/Output Summary (Last 24 hours) at 06/27/2022 1736 Last data filed at 06/27/2022 1600 Gross per 24 hour  Intake 2647.11 ml  Output 4075 ml  Net -1427.89 ml   Filed Weights   06/23/22 0420  Weight: 72.6 kg  General: No distress. Looks well sitting u Neuro: Alert and Oriented x 3. GCS 15. Speech normal. PARAPLEGIC Psych: Pleasant Resp:  Barrel Chest - no.  Wheeze - no, Crackles - no, No overt respiratory distress CVS: Normal heart sounds. Murmurs - no Ext: Stigmata of Connective Tissue Disease - n HEENT: Normal upper airway. PEERL +. No post nasal drip         Resolved Hospital Problem list    Assessment & Plan:   # Hypotension # Severe sepsis with septic shock - acute metabolic encephalopathy, oliguria Sources of sepsis potentially include acute on chronic OM, SSTI LLE, UTI. - growing E colii UTI and Staph aureus in BCID  06/25/22 - afebrile. Coming down on pressors. Levophed down to 08/25/22  MAP 85 -> off 10/10/ and bck on 06/19/22  Plan  - Levophed for MAP > 65 - continue midodrine - ID following - check PCT  # Acute toxic metabolic encephalopathy May be due to sepsis/hypotension vs multiple sedating medications vs possibility of recent methamphetamine or other substance use.   06/25/22 and 06/27/22: resolved  Plan - gradually resume home lyrica, antispasmodics - f/u response to resuscitative measures above  # Anemia of chronic disease # Acute blood loss anemia   - Dilutional plus minor extent of bleeding in chronic wounds - s/p PRBC 06/24/22   Plan - - PRBC for hgb </= 6.9gm%    - exceptions are   -  if ACS susepcted/confirmed then transfuse for hgb </= 8.0gm%,  or    -  active bleeding with hemodynamic instability, then transfuse regardless of hemoglobin value   At at all times try to transfuse 1 unit prbc as possible with exception of active hemorrhage    # Schizophrenia # Mood disturbances  Plan - home abilify  # Moderate protein calorie malnutrition - thiamine   Best Practice (right click and "Reselect all SmartList Selections" daily)   Diet/type: Regular consistency (see orders) DVT prophylaxis: SCD GI prophylaxis: PPI Lines: N/A Foley:  Yes, and it is still needed Code Status:  full code Last date of multidisciplinary goals of care discussion   06/25/22 - called [phone] Domenick Gong - wife: "Call could not be complete"      ATTESTATION & SIGNATURE   The patient Steve [name] is critically ill with multiple organ systems failure and requires high complexity decision making for assessment and support, frequent evaluation and titration of therapies, application of advanced monitoring technologies and extensive interpretation of multiple databases.   Critical Care Time devoted to patient care services described in this note is  30  Minutes. This time reflects time of care of this signee Dr Kalman Shan. This critical care time does not reflect procedure time, or teaching time or supervisory time of PA/NP/Med student/Med Resident etc but could involve care discussion time     Dr. Kalman Shan, M.D., [facility].C.P Pulmonary and Critical Care Medicine Medical Director - [place] ICU Staff Physician, [place] [facility] [place] [facility] Pager: [phone], If no answer or between  15:00h - 7:00h: call [phone]  06/27/2022 5:48 PM     LABS    PULMONARY Recent Labs  Lab  06/23/22 1736  HCO3 24.7  O2SAT 84.3    CBC Recent Labs  Lab 06/25/22 0822 06/26/22 0308 06/27/22 0438  HGB 7.9* 8.2* 7.9*  HCT 25.6* 27.2* 26.9*  WBC 4.6 3.5* 3.3*  PLT 347 286 310    COAGULATION Recent Labs  Lab 06/23/22 0423 06/23/22 1736  INR 1.2 1.3*    CARDIAC  No results for input(s): "TROPONINI" in the last 168 hours. No results for input(s): "PROBNP" in the last 168 hours.   CHEMISTRY Recent Labs  Lab 06/23/22 1604 06/24/22 0044 06/24/22 1659 06/25/22 0822 06/26/22 0308 06/27/22 0438  NA 134* 136 135 137 138 138  K 3.5 3.3* 3.4* 4.0 4.3 4.1  CL 105 107 106 106 104 106  CO2 23 24 24 26 28 28   GLUCOSE 98 88 101* 100* 103* 90  BUN 11 8 8 6 6 8   CREATININE 0.67 0.47* 0.59* 0.47* 0.50* 0.46*  CALCIUM 7.8* 7.8* 7.3* 7.7* 7.9* 7.8*  MG 1.7  --   --  1.8 2.1 2.0  PHOS 3.8  --   --   --  2.9  --    Estimated Creatinine Clearance: 136.1 mL/min (A) (by C-G formula based on SCr of 0.46 mg/dL (L)).   LIVER Recent Labs  Lab 06/23/22 0423 06/23/22 1736 06/24/22 0044 06/24/22 1659 06/25/22 0822 06/26/22 0308 06/27/22 0438  AST 13*  --  6* 8* 8* 8* 9*  ALT 11  --  8 8 8 8 8   ALKPHOS 96  --  76 67 70 74 77  BILITOT 0.5  --  0.9 0.4 0.4 0.3 0.4  PROT 7.9  --  6.4* 5.7* 6.1* 6.2* 5.9*  ALBUMIN 2.5*  --  2.0* 1.8* 2.0* 2.0* 2.0*  INR 1.2 1.3*  --   --   --   --   --      INFECTIOUS Recent Labs  Lab 06/23/22 1604 06/23/22 1736 06/26/22 0256  LATICACIDVEN 1.3 0.7 1.3     ENDOCRINE CBG (last 3)  No results for input(s): "GLUCAP" in the last 72 hours.        IMAGING x48h  - image(s) personally visualized  -   highlighted in bold No results found.

## 2022-06-27 NOTE — Progress Notes (Signed)
    [facility] has been requested to perform a transesophageal echocardiogram on Steve [name] for evaluation of bacteremia. Patient is a 31 year old male with a past medical history of anxiety, depression, paranoid schizophrenia, paraplegia, PTSD. He presented to the ED on 10/7 complaining of leg spasms for 2 weeks. He also complained of chills, night sweats, subjective fevers. Chronic left thigh wound had been draining more than usual. He was found to have septic shock due to Staph aureus bacteremia, likely from his open ulcer and known osteomyelitis. Echocardiogram from 06/25/22 showed EF 60-65%, normal LV diastolic parameters, normal RV systolic function, mild mitral valve regurgitation. ID requested TEE for further evaluation.   After careful review of history and examination, the risks and benefits of transesophageal echocardiogram have been explained including risks of esophageal damage, perforation (1:10,000 risk), bleeding, pharyngeal hematoma as well as other potential complications associated with conscious sedation including aspiration, arrhythmia, respiratory failure and death. Alternatives to treatment were discussed, questions were answered. Patient was able to explain the procedure back to me and asked appropriate follow up questions. Patient is willing to proceed.   Margie Billet, PA-C 06/27/2022 10:13 AM

## 2022-06-27 NOTE — Progress Notes (Signed)
Pharmacy Antibiotic Note  Steve [name] is a 31 y.o. 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  Pharmacy has been consulted for vancomycin and meropenem dosing.  Patient now has MRSA bacteremia. TEE scheduled for tomorrow. Merrem was discontinued 10/9. Vancomycin levels today showed a vancomycin peak of 31 and a vancomycin trough of 18, resulting in a calculated supratherapeutic AUC of 593.   Plan: Decrease vancomycin to 1250 mg IV q12 (est AUC 493.5)  Measure Vanc levels as needed.  Goal AUC = 400 - 550  Follow up renal function, culture results, and clinical course.   Height: 5\' 11"  (180.3 cm) Weight: 72.6 kg (160 lb) IBW/kg (Calculated) : 75.3  Temp (24hrs), Avg:97.9 F (36.6 C), Min:97.7 F (36.5 C), Max:98.1 F (36.7 C)  Recent Labs  Lab 06/23/22 0423 06/23/22 0650 06/23/22 1604 06/23/22 1728 06/23/22 1736 06/24/22 0044 06/24/22 1659 06/25/22 0822 06/26/22 0256 06/26/22 0308 06/26/22 2145 06/27/22 0438  WBC 9.6  --  5.8   < >  --  5.1 3.9* 4.6  --  3.5*  --  3.3*  CREATININE 0.73  --  0.67  --   --  0.47* 0.59* 0.47*  --  0.50*  --  0.46*  LATICACIDVEN 1.9 0.9 1.3  --  0.7  --   --   --  1.3  --   --   --   VANCOTROUGH  --   --   --   --   --   --   --   --   --   --   --  18  VANCOPEAK  --   --   --   --   --   --   --   --   --   --  31  --    < > = values in this interval not displayed.     Estimated Creatinine Clearance: 136.1 mL/min (A) (by C-G formula based on SCr of 0.46 mg/dL (L)).    Allergies  Allergen Reactions   Caffeine Anaphylaxis   Chocolate Anaphylaxis   Tuberculin, Ppd     Other reaction(s): Eruption of skin   Sulfa Antibiotics Rash    Antimicrobials this admission: 10/7 cefepime x 2 doses 10/7 flagyl x 2 doses 10/7 vancomycin>> 10/7 Meropenem>>10/9    Dose adjustments this admission: 10/11: Vanc 1500 mg IV q12h >> Vanc 1250 mg IV q12h    Microbiology  results: 10/9 Bcx: ntgd  10/7 BCx2: 1/3 bottles staph aureus MecA+ 10/7 UCx: >100k E. Coli: R to cipro & Bactrim  Previously: 08/29/21 BCx Providencia rettgeri: R to ALL x cipro, imipenem & pip tazo  Thank you for allowing pharmacy to be a part of this patient's care.  Francena Hanly, PharmD Pharmacy Resident  06/27/2022 8:25 AM

## 2022-06-27 NOTE — Progress Notes (Signed)
PROGRESS NOTE    Steve [name]  [id number] DOB: [birth year] DOA: 06/23/2022 PCP: [place]    Brief Narrative:  This 31 years old male with PMH significant for  paraplegia, longstanding psychiatric diagnoses that includes paranoid schizophrenia, and multisubstance use disorder who presents to the hospital with complaints of lower abdominal pain and lower extremity pain. He reported fever and chills at home.  He additionally reports an injury to his left foot approximately 3 days prior to admission with some erythema and swelling and tenderness.  He has chronic unstageable decubitus ulcer on the left thigh that has been having more drainage than usual.  He was found to have septic shock due to Staph aureus bacteremia.  ID, general surgery, PCCM consulted.  Patient is scheduled to have TEE tomorrow.   Assessment & Plan:   Principal Problem:   SIRS (systemic inflammatory response syndrome) (HCC) Active Problems:   Amphetamine and psychostimulant-induced psychosis with hallucinations (HCC)   Hyponatremia   Normocytic anemia   Anxiety and depression   Chronic post-traumatic stress disorder (PTSD)   Malnutrition of moderate degree   History of schizophrenia   Chronic multifocal osteomyelitis, left femur (HCC)   Amphetamine abuse (HCC)   Gastroesophageal reflux disease without esophagitis   Tobacco use disorder   Cellulitis of left foot   Thrombocytosis   Unstageable pressure ulcer of left hip (HCC)   Sepsis due to undetermined organism ([place])   MRSA bacteremia   Acute cystitis without hematuria   Cellulitis   Quadriplegia ([place])  Septic shock due to MRSA bacteremia: Patient presented with septic shock requiring Levophed support.   Blood cultures obtained on 7/7 shows MRSA.   ID consulted, He is initiated on vancomycin.  Continue vancomycin. Has been weaned off peripheral Levophed on 10/9, but blood pressure is still intermittently soft.  Keep in stepdown for  now Source may be his chronic decubitus ulcers.    Due to lower back pain, an MRI of the lumbar spine is pending. He is scheduled to have TEE tomorrow.   E. coli bacteriuria: Patient has been on meropenem.  Denies urinary symptoms.   Substance use disorder /Psychostimulant induced psychosis with hallucinations: Patient has long-term symptoms, history of paranoid schizophrenia.   Psychiatry has evaluated the patient,  for now continue regimen as below.   Apparently he ran out of his Abilify a week prior to admission, and started having auditory and visual hallucinations.   Now hallucinations are resolved.   Hyponatremia > now resolved.   Hypokalemia > replaced.  Resolved.   Normochromic normocytic anemia :likely in the setting of infectious process.   Status post 2 unit of packed red blood cells, hemoglobin improved and remained stable.   Left hip unstageable pressure ulcer: Wound care consulted, surgery following as well, No role for surgical intervention, surgery signed off 10/9   Thrombocytosis > resolved.   Cellulitis of left foot : CT of the foot shows just edema, No evidence of osteomyelitis. Continue antibiotics as above.   Tobacco use disorder -Smoking cessation. Patient was offered and declined nicotine replacement.   Gastroesophageal reflux disease: Continue pantoprazole 40 mg daily   Amphetamine abuse ([place])- TOC referral   Chronic multifocal osteomyelitis, left femur ID following.  Needs long-term antibiotics.  DVT prophylaxis: SCDs Code Status: Full code Family Communication: No family at bed side. Disposition Plan:   Status is: Inpatient Remains inpatient appropriate because: Admitted for septic shock secondary to decubitus ulcers requiring IV antibiotics.  Work-up is  in progress scheduled for TEE tomorrow.  ID and general surgery is on the board.   Anticipated discharge unknown at this time. Consultants:  General surgery Infectious  diseases Psychiatry PCCM  Procedures: Scheduled TEE tomorrow Antimicrobials:  Anti-infectives (From admission, onward)    Start     Dose/Rate Route Frequency Ordered Stop   06/27/22 1800  vancomycin (VANCOREADY) IVPB 1250 mg/250 mL        1,250 mg 166.7 mL/hr over 90 Minutes Intravenous Every 12 hours 06/27/22 0823     06/24/22 1800  vancomycin (VANCOREADY) IVPB 1500 mg/300 mL  Status:  Discontinued        1,500 mg 150 mL/hr over 120 Minutes Intravenous Every 12 hours 06/24/22 0831 06/27/22 0823   06/23/22 2000  meropenem (MERREM) 1 g in sodium chloride 0.9 % 100 mL IVPB  Status:  Discontinued        1 g 200 mL/hr over 30 Minutes Intravenous Every 8 hours 06/23/22 1804 06/25/22 1028   06/23/22 1800  vancomycin (VANCOREADY) IVPB 1250 mg/250 mL  Status:  Discontinued        1,250 mg 166.7 mL/hr over 90 Minutes Intravenous Every 12 hours 06/23/22 1019 06/24/22 0831   06/23/22 1800  metroNIDAZOLE (FLAGYL) IVPB 500 mg  Status:  Discontinued        500 mg 100 mL/hr over 60 Minutes Intravenous Every 12 hours 06/23/22 1540 06/23/22 1808   06/23/22 1400  ceFEPIme (MAXIPIME) 2 g in sodium chloride 0.9 % 100 mL IVPB  Status:  Discontinued        2 g 200 mL/hr over 30 Minutes Intravenous Every 8 hours 06/23/22 1003 06/23/22 1758   06/23/22 0515  ceFEPIme (MAXIPIME) 2 g in sodium chloride 0.9 % 100 mL IVPB        2 g 200 mL/hr over 30 Minutes Intravenous  Once 06/23/22 0500 06/23/22 0710   06/23/22 0515  metroNIDAZOLE (FLAGYL) IVPB 500 mg        500 mg 100 mL/hr over 60 Minutes Intravenous  Once 06/23/22 0500 06/23/22 0719   06/23/22 0515  vancomycin (VANCOCIN) IVPB 1000 mg/200 mL premix        1,000 mg 200 mL/hr over 60 Minutes Intravenous  Once 06/23/22 0500 06/23/22 0751        Subjective: Patient was seen and examined at bedside.  Overnight events noted. Patient complains of having neck pain as well as lower back pain.  He is lying on the left side. Blood pressure is still remains  on the lower side.  Objective: Vitals:   06/27/22 0355 06/27/22 0400 06/27/22 0500 06/27/22 0700  BP:  118/61 (!) 91/46   Pulse:  73    Resp:  14 10   Temp: 97.7 F (36.5 C)   97.7 F (36.5 C)  TempSrc: Oral   Oral  SpO2:  98%    Weight:      Height:        Intake/Output Summary (Last 24 hours) at 06/27/2022 1141 Last data filed at 06/27/2022 1000 Gross per 24 hour  Intake 3765.78 ml  Output 3950 ml  Net -184.22 ml   Filed Weights   06/23/22 0420  Weight: 72.6 kg    Examination:  General exam: Appears comfortable, not in any acute distress. Respiratory system: CTA bilaterally, respiratory effort normal, RR 15 Cardiovascular system: S1 & S2 heard, regular rate and rhythm, no murmur. Gastrointestinal system: Abdomen is soft, non tender, non distended, BS + Central nervous system: Alert and  oriented x 3. No focal neurological deficits. Extremities: No edema, no cyanosis, no clubbing. Skin: Unstageable decubitus ulcer with chronic osteomyelitis. Psychiatry: Judgement and insight appear normal. Mood & affect appropriate.     Data Reviewed: I have personally reviewed following labs and imaging studies  CBC: Recent Labs  Lab 06/23/22 0423 06/23/22 1604 06/24/22 0044 06/24/22 0832 06/24/22 1659 06/25/22 0822 06/26/22 0308 06/27/22 0438  WBC 9.6   < > 5.1  --  3.9* 4.6 3.5* 3.3*  NEUTROABS 6.9  --   --   --  2.2 2.5  --   --   HGB 7.9*   < > 6.7* 7.7* 7.9* 7.9* 8.2* 7.9*  HCT 25.6*   < > 22.5* 24.5* 26.1* 25.6* 27.2* 26.9*  MCV 83.4   < > 86.2  --  86.1 85.6 86.6 88.8  PLT 462*   < > 287  --  284 347 286 310   < > = values in this interval not displayed.   Basic Metabolic Panel: Recent Labs  Lab 06/23/22 1604 06/24/22 0044 06/24/22 1659 06/25/22 0822 06/26/22 0308 06/27/22 0438  NA 134* 136 135 137 138 138  K 3.5 3.3* 3.4* 4.0 4.3 4.1  CL 105 107 106 106 104 106  CO2 23 24 24 26 28 28   GLUCOSE 98 88 101* 100* 103* 90  BUN 11 8 8 6 6 8   CREATININE  0.67 0.47* 0.59* 0.47* 0.50* 0.46*  CALCIUM 7.8* 7.8* 7.3* 7.7* 7.9* 7.8*  MG 1.7  --   --  1.8 2.1 2.0  PHOS 3.8  --   --   --  2.9  --    GFR: Estimated Creatinine Clearance: 136.1 mL/min (A) (by C-G formula based on SCr of 0.46 mg/dL (L)). Liver Function Tests: Recent Labs  Lab 06/24/22 0044 06/24/22 1659 06/25/22 0822 06/26/22 0308 06/27/22 0438  AST 6* 8* 8* 8* 9*  ALT 8 8 8 8 8   ALKPHOS 76 67 70 74 77  BILITOT 0.9 0.4 0.4 0.3 0.4  PROT 6.4* 5.7* 6.1* 6.2* 5.9*  ALBUMIN 2.0* 1.8* 2.0* 2.0* 2.0*   No results for input(s): "LIPASE", "AMYLASE" in the last 168 hours. Recent Labs  Lab 06/23/22 1736  AMMONIA 21   Coagulation Profile: Recent Labs  Lab 06/23/22 0423 06/23/22 1736  INR 1.2 1.3*   Cardiac Enzymes: Recent Labs  Lab 06/23/22 1736  CKTOTAL 26*   BNP (last 3 results) No results for input(s): "PROBNP" in the last 8760 hours. HbA1C: No results for input(s): "HGBA1C" in the last 72 hours. CBG: Recent Labs  Lab 06/24/22 0816  GLUCAP 103*   Lipid Profile: No results for input(s): "CHOL", "HDL", "LDLCALC", "TRIG", "CHOLHDL", "LDLDIRECT" in the last 72 hours. Thyroid Function Tests: No results for input(s): "TSH", "T4TOTAL", "FREET4", "T3FREE", "THYROIDAB" in the last 72 hours. Anemia Panel: No results for input(s): "VITAMINB12", "FOLATE", "FERRITIN", "TIBC", "IRON", "RETICCTPCT" in the last 72 hours. Sepsis Labs: Recent Labs  Lab 06/23/22 0650 06/23/22 1604 06/23/22 1736 06/26/22 0256  LATICACIDVEN 0.9 1.3 0.7 1.3    Recent Results (from the past 240 hour(s))  Blood Culture (routine x 2)     Status: Abnormal   Collection Time: 06/23/22  4:23 AM   Specimen: BLOOD  Result Value Ref Range Status   Specimen Description   Final    BLOOD RIGHT ANTECUBITAL Performed at [facility] [address], [place], [place] [address]    Special Requests   Final  BOTTLES DRAWN AEROBIC AND ANAEROBIC Blood Culture results may not be  optimal due to an excessive volume of blood received in culture bottles Performed at [facility], [place] [address], [place], [place] [address]    Culture  Setup Time   Final    AEROBIC BOTTLE ONLY GRAM POSITIVE COCCI IN CLUSTERS CRITICAL RESULT CALLED TO, READ BACK BY AND VERIFIED WITH:  C/ PHARMD E. JACKSON 06/23/22 2250 A. LAFRANCE  Performed at [facility], [place] [address], [place], [place] [address]    Culture METHICILLIN RESISTANT STAPHYLOCOCCUS AUREUS (A)  Final   Report Status 06/25/2022 FINAL  Final   Organism ID, Bacteria METHICILLIN RESISTANT STAPHYLOCOCCUS AUREUS  Final      Susceptibility   Methicillin resistant staphylococcus aureus - MIC*    CIPROFLOXACIN >=8 RESISTANT Resistant     ERYTHROMYCIN >=8 RESISTANT Resistant     GENTAMICIN <=0.5 SENSITIVE Sensitive     OXACILLIN >=4 RESISTANT Resistant     TETRACYCLINE >=16 RESISTANT Resistant     VANCOMYCIN 1 SENSITIVE Sensitive     TRIMETH/SULFA >=320 RESISTANT Resistant     CLINDAMYCIN RESISTANT Resistant     RIFAMPIN <=0.5 SENSITIVE Sensitive     Inducible Clindamycin POSITIVE Resistant     * METHICILLIN RESISTANT STAPHYLOCOCCUS AUREUS  Urine Culture     Status: Abnormal   Collection Time: 06/23/22  4:23 AM   Specimen: In/Out Cath Urine  Result Value Ref Range Status   Specimen Description   Final    IN/OUT CATH URINE Performed at [facility] [address] [place], [place] [address]    Special Requests   Final    Normal Performed at [facility], [place] [address], [place], [place] [address]    Culture >=100,000 COLONIES/mL ESCHERICHIA COLI (A)  Final   Report Status 06/25/2022 FINAL  Final   Organism ID, Bacteria ESCHERICHIA COLI (A)  Final      Susceptibility   Escherichia coli - MIC*    AMPICILLIN <=2 SENSITIVE Sensitive     CEFAZOLIN <=4 SENSITIVE Sensitive     CEFEPIME <=0.12 SENSITIVE Sensitive     CEFTRIAXONE <=0.25 SENSITIVE Sensitive      CIPROFLOXACIN >=4 RESISTANT Resistant     GENTAMICIN <=1 SENSITIVE Sensitive     IMIPENEM <=0.25 SENSITIVE Sensitive     NITROFURANTOIN <=16 SENSITIVE Sensitive     TRIMETH/SULFA >=320 RESISTANT Resistant     AMPICILLIN/SULBACTAM <=2 SENSITIVE Sensitive     PIP/TAZO <=4 SENSITIVE Sensitive     * >=100,000 COLONIES/mL ESCHERICHIA COLI  Blood Culture ID Panel (Reflexed)     Status: Abnormal   Collection Time: 06/23/22  4:23 AM  Result Value Ref Range Status   Enterococcus faecalis NOT DETECTED NOT DETECTED Final   Enterococcus Faecium NOT DETECTED NOT DETECTED Final   Listeria monocytogenes NOT DETECTED NOT DETECTED Final   Staphylococcus species DETECTED (A) NOT DETECTED Final    Comment: CRITICAL RESULT CALLED TO, READ BACK BY AND VERIFIED WITH:  C/ PHARMD E. JACKSON 06/23/22 2250 A. LAFRANCE     Staphylococcus aureus (BCID) DETECTED (A) NOT DETECTED Final    Comment: Methicillin (oxacillin)-resistant Staphylococcus aureus (MRSA). MRSA is predictably resistant to beta-lactam antibiotics (except ceftaroline). Preferred therapy is vancomycin unless clinically contraindicated. Patient requires contact precautions if  hospitalized. CRITICAL RESULT CALLED TO, READ BACK BY AND VERIFIED WITH:  C/ PHARMD E. JACKSON 06/23/22 2250 A. LAFRANCE     Staphylococcus epidermidis NOT DETECTED NOT DETECTED Final  Staphylococcus lugdunensis NOT DETECTED NOT DETECTED Final   Streptococcus species NOT DETECTED NOT DETECTED Final   Streptococcus agalactiae NOT DETECTED NOT DETECTED Final   Streptococcus pneumoniae NOT DETECTED NOT DETECTED Final   Streptococcus pyogenes NOT DETECTED NOT DETECTED Final   A.calcoaceticus-baumannii NOT DETECTED NOT DETECTED Final   Bacteroides fragilis NOT DETECTED NOT DETECTED Final   Enterobacterales NOT DETECTED NOT DETECTED Final   Enterobacter cloacae complex NOT DETECTED NOT DETECTED Final   Escherichia coli NOT DETECTED NOT DETECTED Final   Klebsiella aerogenes NOT  DETECTED NOT DETECTED Final   Klebsiella oxytoca NOT DETECTED NOT DETECTED Final   Klebsiella pneumoniae NOT DETECTED NOT DETECTED Final   Proteus species NOT DETECTED NOT DETECTED Final   Salmonella species NOT DETECTED NOT DETECTED Final   Serratia marcescens NOT DETECTED NOT DETECTED Final   Haemophilus influenzae NOT DETECTED NOT DETECTED Final   Neisseria meningitidis NOT DETECTED NOT DETECTED Final   Pseudomonas aeruginosa NOT DETECTED NOT DETECTED Final   Stenotrophomonas maltophilia NOT DETECTED NOT DETECTED Final   Candida albicans NOT DETECTED NOT DETECTED Final   Candida auris NOT DETECTED NOT DETECTED Final   Candida glabrata NOT DETECTED NOT DETECTED Final   Candida krusei NOT DETECTED NOT DETECTED Final   Candida parapsilosis NOT DETECTED NOT DETECTED Final   Candida tropicalis NOT DETECTED NOT DETECTED Final   Cryptococcus neoformans/gattii NOT DETECTED NOT DETECTED Final   Meth resistant mecA/C and MREJ DETECTED (A) NOT DETECTED Final    Comment: CRITICAL RESULT CALLED TO, READ BACK BY AND VERIFIED WITH:  C/ PHARMD E. JACKSON 06/23/22 2250 A. LAFRANCE  Performed at [facility], [place] [address], [place], [place] [address]   Blood Culture (routine x 2)     Status: None (Preliminary result)   Collection Time: 06/23/22  4:28 AM   Specimen: BLOOD  Result Value Ref Range Status   Specimen Description   Final    BLOOD BLOOD RIGHT HAND Performed at [facility] [address] [place], [place] [address]    Special Requests   Final    BOTTLES DRAWN AEROBIC ONLY Blood Culture adequate volume Performed at [facility] [address], [place], [place] [address]    Culture   Final    NO GROWTH 3 DAYS Performed at [facility], [place] [address] [place], [place] [address]    Report Status PENDING  Incomplete  Resp Panel by RT-PCR (Flu A&B, Covid) Anterior Nasal Swab     Status: None   Collection Time: 06/23/22  5:01 AM    Specimen: Anterior Nasal Swab  Result Value Ref Range Status   SARS Coronavirus 2 by RT PCR NEGATIVE NEGATIVE Final    Comment: (NOTE) SARS-CoV-2 target nucleic acids are NOT DETECTED.  The SARS-CoV-2 RNA is generally detectable in upper respiratory specimens during the acute phase of infection. The lowest concentration of SARS-CoV-2 viral copies this assay can detect is 138 copies/mL. A negative result does not preclude SARS-Cov-2 infection and should not be used as the sole basis for treatment or other patient management decisions. A negative result may occur with  improper specimen collection/handling, submission of specimen other than nasopharyngeal swab, presence of viral mutation(s) within the areas targeted by this assay, and inadequate number of viral copies(<138 copies/mL). A negative result must be combined with clinical observations, patient history, and epidemiological information. The expected result is Negative.  Fact Sheet for Patients:  EntrepreneurPulse.com.au  Fact Sheet for Healthcare Providers:  IncredibleEmployment.be  This test is no t yet approved or cleared by the [place] and  has been authorized for detection and/or diagnosis of SARS-CoV-2 by FDA under an Emergency Use Authorization (EUA). This EUA will remain  in effect (meaning this test can be used) for the duration of the COVID-19 declaration under Section 564(b)(1) of the Act, 21 U.S.C.section 360bbb-3(b)(1), unless the authorization is terminated  or revoked sooner.       Influenza A by PCR NEGATIVE NEGATIVE Final   Influenza B by PCR NEGATIVE NEGATIVE Final    Comment: (NOTE) The Xpert Xpress SARS-CoV-2/FLU/RSV plus assay is intended as an aid in the diagnosis of influenza from Nasopharyngeal swab specimens and should not be used as a sole basis for treatment. Nasal washings and aspirates are unacceptable for Xpert Xpress  SARS-CoV-2/FLU/RSV testing.  Fact Sheet for Patients: EntrepreneurPulse.com.au  Fact Sheet for Healthcare Providers: IncredibleEmployment.be  This test is not yet approved or cleared by the [place] FDA and has been authorized for detection and/or diagnosis of SARS-CoV-2 by FDA under an Emergency Use Authorization (EUA). This EUA will remain in effect (meaning this test can be used) for the duration of the COVID-19 declaration under Section 564(b)(1) of the Act, 21 U.S.C. section 360bbb-3(b)(1), unless the authorization is terminated or revoked.  Performed at [facility], [place] [address] [place], [place] [address]   MRSA Next Gen by PCR, Nasal     Status: Abnormal   Collection Time: 06/23/22  5:53 PM   Specimen: Nasal Mucosa; Nasal Swab  Result Value Ref Range Status   MRSA by PCR Next Gen DETECTED (A) NOT DETECTED Final    Comment: CRITICAL RESULT CALLED TO, READ BACK BY AND VERIFIED WITH: BLOCK,D. 06/23/22 @1908  BY SEEL,M. (NOTE) The GeneXpert MRSA Assay (FDA approved for NASAL specimens only), is one component of a comprehensive MRSA colonization surveillance program. It is not intended to diagnose MRSA infection nor to guide or monitor treatment for MRSA infections. Test performance is not FDA approved in patients less than 77 years old. Performed at [facility], [place] [address] [place], [place] [address]   Culture, blood (Routine X 2) w Reflex to ID Panel     Status: None (Preliminary result)   Collection Time: 06/25/22  6:49 AM   Specimen: BLOOD  Result Value Ref Range Status   Specimen Description   Final    BLOOD Blood Culture results may not be optimal due to an inadequate volume of blood received in culture bottles Performed at [facility], [place] [address] [place], [place] [address]    Special Requests   Final    BLOOD RIGHT HAND AEROBIC BOTTLE ONLY Performed at  [facility] [address], [place], [place] [address]    Culture   Final    NO GROWTH 1 DAY Performed at [facility], [place] [address] [place], [place] [address]    Report Status PENDING  Incomplete    Radiology Studies: No results found.  Scheduled Meds:  (feeding supplement) PROSource Plus  30 mL Oral Daily   ARIPiprazole  15 mg Oral Daily   Chlorhexidine Gluconate Cloth  6 each Topical Daily   feeding supplement  237 mL Oral BID BM   midodrine  5 mg Oral BID WC   mupirocin ointment  1 Application Nasal BID   pantoprazole  40 mg Oral Daily   pregabalin  150 mg Oral BID   senna-docusate  1 tablet Oral BID   sertraline  25 mg Oral Daily   thiamine (VITAMIN B1) injection  100 mg Intravenous Daily   Continuous Infusions:  sodium chloride 10 mL/hr at 06/27/22 1000   dextrose 5% lactated ringers 75 mL/hr at 06/27/22 1000   vancomycin       LOS: 4 days    Time spent: 50 mins    Vanellope Passmore, MD [facility]   If 7PM-7AM, please contact night-coverage

## 2022-06-28 ENCOUNTER — Inpatient Hospital Stay (HOSPITAL_COMMUNITY): Payer: No Typology Code available for payment source | Admitting: Certified Registered"

## 2022-06-28 ENCOUNTER — Encounter (HOSPITAL_COMMUNITY)
Admission: EM | Disposition: A | Discharge status: Home Health | Payer: Self-pay | Source: Home / Self Care | Attending: Family Medicine

## 2022-06-28 ENCOUNTER — Encounter (HOSPITAL_COMMUNITY): Payer: Self-pay

## 2022-06-28 ENCOUNTER — Inpatient Hospital Stay (HOSPITAL_COMMUNITY): Payer: No Typology Code available for payment source

## 2022-06-28 DIAGNOSIS — I340 Nonrheumatic mitral (valve) insufficiency: Secondary | ICD-10-CM

## 2022-06-28 DIAGNOSIS — R7881 Bacteremia: Secondary | ICD-10-CM

## 2022-06-28 DIAGNOSIS — I517 Cardiomegaly: Secondary | ICD-10-CM | POA: Diagnosis not present

## 2022-06-28 DIAGNOSIS — F17210 Nicotine dependence, cigarettes, uncomplicated: Secondary | ICD-10-CM

## 2022-06-28 DIAGNOSIS — R6510 Systemic inflammatory response syndrome (SIRS) of non-infectious origin without acute organ dysfunction: Secondary | ICD-10-CM | POA: Diagnosis not present

## 2022-06-28 DIAGNOSIS — F418 Other specified anxiety disorders: Secondary | ICD-10-CM

## 2022-06-28 HISTORY — PX: TEE WITHOUT CARDIOVERSION: SHX5443

## 2022-06-28 LAB — CBC
HCT: 27.5 % — ABNORMAL LOW (ref 39.0–52.0)
Hemoglobin: 8.2 g/dL — ABNORMAL LOW (ref 13.0–17.0)
MCH: 26.5 pg (ref 26.0–34.0)
MCHC: 29.8 g/dL — ABNORMAL LOW (ref 30.0–36.0)
MCV: 88.7 fL (ref 80.0–100.0)
Platelets: 293 10*3/uL (ref 150–400)
RBC: 3.1 MIL/uL — ABNORMAL LOW (ref 4.22–5.81)
RDW: 17.4 % — ABNORMAL HIGH (ref 11.5–15.5)
WBC: 3.5 10*3/uL — ABNORMAL LOW (ref 4.0–10.5)
nRBC: 0 % (ref 0.0–0.2)

## 2022-06-28 LAB — CULTURE, BLOOD (ROUTINE X 2)
Culture: NO GROWTH
Special Requests: ADEQUATE

## 2022-06-28 SURGERY — ECHOCARDIOGRAM, TRANSESOPHAGEAL
Anesthesia: Monitor Anesthesia Care

## 2022-06-28 MED ORDER — PROPOFOL 500 MG/50ML IV EMUL
INTRAVENOUS | Status: DC | PRN
  Administered 2022-06-28: 150 ug/kg/min via INTRAVENOUS

## 2022-06-28 MED ORDER — PROPOFOL 10 MG/ML IV BOLUS
INTRAVENOUS | Status: DC | PRN
  Administered 2022-06-28: 20 mg via INTRAVENOUS
  Administered 2022-06-28: 60 mg via INTRAVENOUS
  Administered 2022-06-28: 20 mg via INTRAVENOUS

## 2022-06-28 MED ORDER — LIDOCAINE HCL (CARDIAC) PF 100 MG/5ML IV SOSY
PREFILLED_SYRINGE | INTRAVENOUS | Status: DC | PRN
  Administered 2022-06-28: 100 mg via INTRAVENOUS

## 2022-06-28 MED ORDER — SODIUM CHLORIDE 0.9 % IV SOLN: INTRAVENOUS | Status: DC

## 2022-06-28 NOTE — Progress Notes (Signed)
PCCM  Off pressors  At [facility] For TEE  CCM will hold off rounding 06/28/2022     SIGNATURE    Dr. Brand Males, M.D., F.C.C.P,  Pulmonary and Critical Care Medicine Staff Physician, [place] Director - Interstitial Lung Disease  Program  Medical Director - [place] [facility] [place] at [place], [place], [address]   Pager: [phone], If no answer  -[place] or [place] [phone] Telephone (clinical office): [phone] Telephone (research): [phone]  11:29 AM 06/28/2022

## 2022-06-28 NOTE — Anesthesia Preprocedure Evaluation (Signed)
Anesthesia Evaluation  Patient identified by MRN, date of birth, ID band Patient awake    Reviewed: Allergy & Precautions, H&P , NPO status , Patient's Chart, lab work & pertinent test results  Airway Mallampati: II   Neck ROM: full    Dental  (+) Poor Dentition, Missing   Pulmonary Current Smoker and Patient abstained from smoking.,    breath sounds clear to auscultation       Cardiovascular  Rhythm:regular Rate:Normal     Neuro/Psych PSYCHIATRIC DISORDERS Anxiety Depression Schizophrenia    GI/Hepatic GERD  Medicated and Controlled,  Endo/Other    Renal/GU   negative genitourinary   Musculoskeletal   Abdominal Normal abdominal exam  (+)   Peds  Hematology  (+) Blood dyscrasia, anemia ,   Anesthesia Other Findings   Reproductive/Obstetrics                             Anesthesia Physical  Anesthesia Plan  ASA: 2  Anesthesia Plan: MAC   Post-op Pain Management:    Induction: Intravenous  PONV Risk Score and Plan: 0 and Propofol infusion, Treatment may vary due to age or medical condition and TIVA  Airway Management Planned: Mask and Natural Airway  Additional Equipment: None  Intra-op Plan:   Post-operative Plan:   Informed Consent: I have reviewed the patients History and Physical, chart, labs and discussed the procedure including the risks, benefits and alternatives for the proposed anesthesia with the patient or authorized representative who has indicated his/her understanding and acceptance.     Dental advisory given  Plan Discussed with: CRNA  Anesthesia Plan Comments:         Anesthesia Quick Evaluation

## 2022-06-28 NOTE — Progress Notes (Signed)
PROGRESS NOTE    Steve [name]  [id number] DOB: May 15, 1991 DOA: 06/23/2022 PCP: [place]    Brief Narrative:  This 31 years old male with PMH significant for  paraplegia, longstanding psychiatric diagnoses that includes paranoid schizophrenia, and multisubstance use disorder who presents to the hospital with complaints of lower abdominal pain and lower extremity pain. He reported fever and chills at home.  He additionally reports an injury to his left foot approximately 3 days prior to admission with some erythema and swelling and tenderness.  He has chronic unstageable decubitus ulcer on the left thigh that has been having more drainage than usual.  He was found to have septic shock due to Staph aureus bacteremia.  ID, general surgery, PCCM consulted.  Patient is scheduled to have TEE today.  He became hypotensive requiring Levophed support.   Assessment & Plan:   Principal Problem:   SIRS (systemic inflammatory response syndrome) (HCC) Active Problems:   Amphetamine and psychostimulant-induced psychosis with hallucinations (HCC)   Hyponatremia   Normocytic anemia   Anxiety and depression   Chronic post-traumatic stress disorder (PTSD)   Malnutrition of moderate degree   History of schizophrenia   Chronic multifocal osteomyelitis, left femur (HCC)   Amphetamine abuse (HCC)   Gastroesophageal reflux disease without esophagitis   Tobacco use disorder   Cellulitis of left foot   Thrombocytosis   Unstageable pressure ulcer of left hip (HCC)   Sepsis due to undetermined organism ([place])   MRSA bacteremia   Acute cystitis without hematuria   Cellulitis   Quadriplegia ([place])   Shock circulatory ([place])  Septic shock due to MRSA bacteremia: Patient presented with septic shock requiring Levophed support.   Blood cultures obtained on 7/7 shows MRSA.   ID consulted, He is initiated on vancomycin.  Continue vancomycin. Has been weaned off peripheral Levophed on 10/9,  but blood pressure is still intermittently soft.  Keep in stepdown for now Source may be his chronic decubitus ulcers.    Due to lower back pain, an MRI of the lumbar spine is pending. He is scheduled to have TEE today. He became hypotensive requiring Levophed support yesterday. Blood pressure is improved now , off Levophed. Patient is going to [facility] for [facility]EE.   E. coli bacteriuria: Patient has been on meropenem.  Denies urinary symptoms.   Substance use disorder /Psychostimulant induced psychosis with hallucinations: Patient has long-term symptoms, history of paranoid schizophrenia.   Psychiatry has evaluated the patient,  for now continue regimen as below.   Apparently he ran out of his Abilify a week prior to admission, and started having auditory and visual hallucinations.   Now hallucinations are resolved.   Hyponatremia > now resolved.   Hypokalemia > replaced.  Resolved.   Normochromic normocytic anemia :likely in the setting of infectious process.   Status post 2 unit of packed red blood cells, hemoglobin improved and remained stable.   Left hip unstageable pressure ulcer: Wound care consulted, surgery following as well, No role for surgical intervention, surgery signed off 10/9   Thrombocytosis > resolved.   Cellulitis of left foot : CT of the foot shows just edema, No evidence of osteomyelitis. Continue antibiotics as above.   Tobacco use disorder -Smoking cessation. Patient was offered and declined nicotine replacement.   Gastroesophageal reflux disease: Continue pantoprazole 40 mg daily   Amphetamine abuse ([place])- TOC referral   Chronic multifocal osteomyelitis, left femur ID following.  Needs long-term antibiotics.  DVT prophylaxis: SCDs Code Status:  Full code Family Communication: No family at bed side. Disposition Plan:   Status is: Inpatient Remains inpatient appropriate because: Admitted for septic shock secondary to decubitus ulcers requiring IV  antibiotics.  Work-up is in progress scheduled for TEE today.  ID and general surgery is on the board.   Anticipated discharge unknown at this time.  Consultants:  General surgery Infectious diseases Psychiatry PCCM  Procedures: Scheduled TEE today. Antimicrobials:  Anti-infectives (From admission, onward)    Start     Dose/Rate Route Frequency Ordered Stop   06/27/22 1800  [MAR Hold]  vancomycin (VANCOREADY) IVPB 1250 mg/250 mL        (MAR Hold since Thu 06/28/2022 at 1149.Hold Reason: Transfer to a Procedural area)   1,250 mg 166.7 mL/hr over 90 Minutes Intravenous Every 12 hours 06/27/22 0823     06/24/22 1800  vancomycin (VANCOREADY) IVPB 1500 mg/300 mL  Status:  Discontinued        1,500 mg 150 mL/hr over 120 Minutes Intravenous Every 12 hours 06/24/22 0831 06/27/22 0823   06/23/22 2000  meropenem (MERREM) 1 g in sodium chloride 0.9 % 100 mL IVPB  Status:  Discontinued        1 g 200 mL/hr over 30 Minutes Intravenous Every 8 hours 06/23/22 1804 06/25/22 1028   06/23/22 1800  vancomycin (VANCOREADY) IVPB 1250 mg/250 mL  Status:  Discontinued        1,250 mg 166.7 mL/hr over 90 Minutes Intravenous Every 12 hours 06/23/22 1019 06/24/22 0831   06/23/22 1800  metroNIDAZOLE (FLAGYL) IVPB 500 mg  Status:  Discontinued        500 mg 100 mL/hr over 60 Minutes Intravenous Every 12 hours 06/23/22 1540 06/23/22 1808   06/23/22 1400  ceFEPIme (MAXIPIME) 2 g in sodium chloride 0.9 % 100 mL IVPB  Status:  Discontinued        2 g 200 mL/hr over 30 Minutes Intravenous Every 8 hours 06/23/22 1003 06/23/22 1758   06/23/22 0515  ceFEPIme (MAXIPIME) 2 g in sodium chloride 0.9 % 100 mL IVPB        2 g 200 mL/hr over 30 Minutes Intravenous  Once 06/23/22 0500 06/23/22 0710   06/23/22 0515  metroNIDAZOLE (FLAGYL) IVPB 500 mg        500 mg 100 mL/hr over 60 Minutes Intravenous  Once 06/23/22 0500 06/23/22 0719   06/23/22 0515  vancomycin (VANCOCIN) IVPB 1000 mg/200 mL premix        1,000 mg 200  mL/hr over 60 Minutes Intravenous  Once 06/23/22 0500 06/23/22 0751        Subjective: Patient was seen and examined at bedside.  Overnight events noted. Patient was hypotensive yesterday despite given fluid bolus.  Started on Levophed. Blood pressure is improved today , Levophed is discontinued.  Patient is going for TEE at [facility] today.  Objective: Vitals:   06/28/22 1201 06/28/22 1314 06/28/22 1315 06/28/22 1320  BP: 120/78 (!) 102/57  125/74  Pulse:  76  66  Resp: 16 14  14   Temp:  (!) 97.4 F (36.3 C)    TempSrc:  Temporal    SpO2: 95% 98% 98% 99%  Weight:      Height:        Intake/Output Summary (Last 24 hours) at 06/28/2022 1323 Last data filed at 06/28/2022 1313 Gross per 24 hour  Intake 2175.08 ml  Output 3200 ml  Net -1024.92 ml   Filed Weights   06/23/22 0420  Weight: 72.6  kg    Examination:  General exam: Appears comfortable, not in any acute distress, deconditioned. Respiratory system: CTA bilaterally, respiratory effort normal, RR 15. Cardiovascular system: S1 & S2 heard, regular rate and rhythm, no murmur. Gastrointestinal system: Abdomen is soft, non tender, non distended, BS+ Central nervous system: Alert and oriented x 3. No focal neurological deficits. Extremities: No edema, no cyanosis, no clubbing. Skin: Unstageable decubitus ulcer with chronic osteomyelitis. Psychiatry: Judgement and insight appear normal. Mood & affect appropriate.    Data Reviewed: I have personally reviewed following labs and imaging studies  CBC: Recent Labs  Lab 06/23/22 0423 06/23/22 1604 06/24/22 1659 06/25/22 0822 06/26/22 0308 06/27/22 0438 06/28/22 0923  WBC 9.6   < > 3.9* 4.6 3.5* 3.3* 3.5*  NEUTROABS 6.9  --  2.2 2.5  --   --   --   HGB 7.9*   < > 7.9* 7.9* 8.2* 7.9* 8.2*  HCT 25.6*   < > 26.1* 25.6* 27.2* 26.9* 27.5*  MCV 83.4   < > 86.1 85.6 86.6 88.8 88.7  PLT 462*   < > 284 347 286 310 293   < > = values in this interval not displayed.   Basic  Metabolic Panel: Recent Labs  Lab 06/23/22 1604 06/24/22 0044 06/24/22 1659 06/25/22 0822 06/26/22 0308 06/27/22 0438  NA 134* 136 135 137 138 138  K 3.5 3.3* 3.4* 4.0 4.3 4.1  CL 105 107 106 106 104 106  CO2 23 24 24 26 28 28   GLUCOSE 98 88 101* 100* 103* 90  BUN 11 8 8 6 6 8   CREATININE 0.67 0.47* 0.59* 0.47* 0.50* 0.46*  CALCIUM 7.8* 7.8* 7.3* 7.7* 7.9* 7.8*  MG 1.7  --   --  1.8 2.1 2.0  PHOS 3.8  --   --   --  2.9  --    GFR: Estimated Creatinine Clearance: 136.1 mL/min (A) (by C-G formula based on SCr of 0.46 mg/dL (L)). Liver Function Tests: Recent Labs  Lab 06/24/22 0044 06/24/22 1659 06/25/22 0822 06/26/22 0308 06/27/22 0438  AST 6* 8* 8* 8* 9*  ALT 8 8 8 8 8   ALKPHOS 76 67 70 74 77  BILITOT 0.9 0.4 0.4 0.3 0.4  PROT 6.4* 5.7* 6.1* 6.2* 5.9*  ALBUMIN 2.0* 1.8* 2.0* 2.0* 2.0*   No results for input(s): "LIPASE", "AMYLASE" in the last 168 hours. Recent Labs  Lab 06/23/22 1736  AMMONIA 21   Coagulation Profile: Recent Labs  Lab 06/23/22 0423 06/23/22 1736  INR 1.2 1.3*   Cardiac Enzymes: Recent Labs  Lab 06/23/22 1736  CKTOTAL 26*   BNP (last 3 results) No results for input(s): "PROBNP" in the last 8760 hours. HbA1C: No results for input(s): "HGBA1C" in the last 72 hours. CBG: Recent Labs  Lab 06/24/22 0816  GLUCAP 103*   Lipid Profile: No results for input(s): "CHOL", "HDL", "LDLCALC", "TRIG", "CHOLHDL", "LDLDIRECT" in the last 72 hours. Thyroid Function Tests: No results for input(s): "TSH", "T4TOTAL", "FREET4", "T3FREE", "THYROIDAB" in the last 72 hours. Anemia Panel: No results for input(s): "VITAMINB12", "FOLATE", "FERRITIN", "TIBC", "IRON", "RETICCTPCT" in the last 72 hours. Sepsis Labs: Recent Labs  Lab 06/23/22 0650 06/23/22 1604 06/23/22 1736 06/26/22 0256  LATICACIDVEN 0.9 1.3 0.7 1.3    Recent Results (from the past 240 hour(s))  Blood Culture (routine x 2)     Status: Abnormal   Collection Time: 06/23/22  4:23 AM    Specimen: BLOOD  Result Value Ref Range Status  Specimen Description   Final    BLOOD RIGHT ANTECUBITAL Performed at [facility] [address], [place], [place] [address]    Special Requests   Final    BOTTLES DRAWN AEROBIC AND ANAEROBIC Blood Culture results may not be optimal due to an excessive volume of blood received in culture bottles Performed at [facility] [address] [place], [place] [address]    Culture  Setup Time   Final    AEROBIC BOTTLE ONLY GRAM POSITIVE COCCI IN CLUSTERS CRITICAL RESULT CALLED TO, READ BACK BY AND VERIFIED WITH:  C/ PHARMD E. JACKSON 06/23/22 2250 A. LAFRANCE  Performed at [facility], [place] [address] [place], [place] [address]    Culture METHICILLIN RESISTANT STAPHYLOCOCCUS AUREUS (A)  Final   Report Status 06/25/2022 FINAL  Final   Organism ID, Bacteria METHICILLIN RESISTANT STAPHYLOCOCCUS AUREUS  Final      Susceptibility   Methicillin resistant staphylococcus aureus - MIC*    CIPROFLOXACIN >=8 RESISTANT Resistant     ERYTHROMYCIN >=8 RESISTANT Resistant     GENTAMICIN <=0.5 SENSITIVE Sensitive     OXACILLIN >=4 RESISTANT Resistant     TETRACYCLINE >=16 RESISTANT Resistant     VANCOMYCIN 1 SENSITIVE Sensitive     TRIMETH/SULFA >=320 RESISTANT Resistant     CLINDAMYCIN RESISTANT Resistant     RIFAMPIN <=0.5 SENSITIVE Sensitive     Inducible Clindamycin POSITIVE Resistant     * METHICILLIN RESISTANT STAPHYLOCOCCUS AUREUS  Urine Culture     Status: Abnormal   Collection Time: 06/23/22  4:23 AM   Specimen: In/Out Cath Urine  Result Value Ref Range Status   Specimen Description   Final    IN/OUT CATH URINE Performed at [facility] [address] [place], [place] [address]    Special Requests   Final    Normal Performed at [facility], [place] [address] [place],  [address]    Culture >=100,000 COLONIES/mL ESCHERICHIA COLI (A)  Final    Report Status 06/25/2022 FINAL  Final   Organism ID, Bacteria ESCHERICHIA COLI (A)  Final      Susceptibility   Escherichia coli - MIC*    AMPICILLIN <=2 SENSITIVE Sensitive     CEFAZOLIN <=4 SENSITIVE Sensitive     CEFEPIME <=0.12 SENSITIVE Sensitive     CEFTRIAXONE <=0.25 SENSITIVE Sensitive     CIPROFLOXACIN >=4 RESISTANT Resistant     GENTAMICIN <=1 SENSITIVE Sensitive     IMIPENEM <=0.25 SENSITIVE Sensitive     NITROFURANTOIN <=16 SENSITIVE Sensitive     TRIMETH/SULFA >=320 RESISTANT Resistant     AMPICILLIN/SULBACTAM <=2 SENSITIVE Sensitive     PIP/TAZO <=4 SENSITIVE Sensitive     * >=100,000 COLONIES/mL ESCHERICHIA COLI  Blood Culture ID Panel (Reflexed)     Status: Abnormal   Collection Time: 06/23/22  4:23 AM  Result Value Ref Range Status   Enterococcus faecalis NOT DETECTED NOT DETECTED Final   Enterococcus Faecium NOT DETECTED NOT DETECTED Final   Listeria monocytogenes NOT DETECTED NOT DETECTED Final   Staphylococcus species DETECTED (A) NOT DETECTED Final    Comment: CRITICAL RESULT CALLED TO, READ BACK BY AND VERIFIED WITH:  C/ PHARMD E. JACKSON 06/23/22 2250 A. LAFRANCE     Staphylococcus aureus (BCID) DETECTED (A) NOT DETECTED Final    Comment: Methicillin (oxacillin)-resistant Staphylococcus aureus (MRSA). MRSA is predictably resistant to beta-lactam antibiotics (except ceftaroline). Preferred therapy is vancomycin unless clinically contraindicated. Patient requires contact precautions  if  hospitalized. CRITICAL RESULT CALLED TO, READ BACK BY AND VERIFIED WITH:  C/ PHARMD E. JACKSON 06/23/22 2250 A. LAFRANCE     Staphylococcus epidermidis NOT DETECTED NOT DETECTED Final   Staphylococcus lugdunensis NOT DETECTED NOT DETECTED Final   Streptococcus species NOT DETECTED NOT DETECTED Final   Streptococcus agalactiae NOT DETECTED NOT DETECTED Final   Streptococcus pneumoniae NOT DETECTED NOT DETECTED Final   Streptococcus pyogenes NOT DETECTED NOT DETECTED Final    A.calcoaceticus-baumannii NOT DETECTED NOT DETECTED Final   Bacteroides fragilis NOT DETECTED NOT DETECTED Final   Enterobacterales NOT DETECTED NOT DETECTED Final   Enterobacter cloacae complex NOT DETECTED NOT DETECTED Final   Escherichia coli NOT DETECTED NOT DETECTED Final   Klebsiella aerogenes NOT DETECTED NOT DETECTED Final   Klebsiella oxytoca NOT DETECTED NOT DETECTED Final   Klebsiella pneumoniae NOT DETECTED NOT DETECTED Final   Proteus species NOT DETECTED NOT DETECTED Final   Salmonella species NOT DETECTED NOT DETECTED Final   Serratia marcescens NOT DETECTED NOT DETECTED Final   Haemophilus influenzae NOT DETECTED NOT DETECTED Final   Neisseria meningitidis NOT DETECTED NOT DETECTED Final   Pseudomonas aeruginosa NOT DETECTED NOT DETECTED Final   Stenotrophomonas maltophilia NOT DETECTED NOT DETECTED Final   Candida albicans NOT DETECTED NOT DETECTED Final   Candida auris NOT DETECTED NOT DETECTED Final   Candida glabrata NOT DETECTED NOT DETECTED Final   Candida krusei NOT DETECTED NOT DETECTED Final   Candida parapsilosis NOT DETECTED NOT DETECTED Final   Candida tropicalis NOT DETECTED NOT DETECTED Final   Cryptococcus neoformans/gattii NOT DETECTED NOT DETECTED Final   Meth resistant mecA/C and MREJ DETECTED (A) NOT DETECTED Final    Comment: CRITICAL RESULT CALLED TO, READ BACK BY AND VERIFIED WITH:  C/ PHARMD E. JACKSON 06/23/22 2250 A. LAFRANCE  Performed at [facility], [address] [place], [place] [address]   Blood Culture (routine x 2)     Status: None   Collection Time: 06/23/22  4:28 AM   Specimen: BLOOD  Result Value Ref Range Status   Specimen Description   Final    BLOOD BLOOD RIGHT HAND Performed at [facility], [address], [place], [place] [address]    Special Requests   Final    BOTTLES DRAWN AEROBIC ONLY Blood Culture adequate volume Performed at [facility], [address], [place],  [place] [address]    Culture   Final    NO GROWTH 5 DAYS Performed at [facility], [address] [place], [place] [address]    Report Status 06/28/2022 FINAL  Final  Resp Panel by RT-PCR (Flu A&B, Covid) Anterior Nasal Swab     Status: None   Collection Time: 06/23/22  5:01 AM   Specimen: Anterior Nasal Swab  Result Value Ref Range Status   SARS Coronavirus 2 by RT PCR NEGATIVE NEGATIVE Final    Comment: (NOTE) SARS-CoV-2 target nucleic acids are NOT DETECTED.  The SARS-CoV-2 RNA is generally detectable in upper respiratory specimens during the acute phase of infection. The lowest concentration of SARS-CoV-2 viral copies this assay can detect is 138 copies/mL. A negative result does not preclude SARS-Cov-2 infection and should not be used as the sole basis for treatment or other patient management decisions. A negative result may occur with  improper specimen collection/handling, submission of specimen other than nasopharyngeal swab, presence of viral mutation(s) within the areas targeted by this assay, and inadequate number of viral  copies(<138 copies/mL). A negative result must be combined with clinical observations, patient history, and epidemiological information. The expected result is Negative.  Fact Sheet for Patients:  EntrepreneurPulse.com.au  Fact Sheet for Healthcare Providers:  IncredibleEmployment.be  This test is no t yet approved or cleared by the [place] FDA and  has been authorized for detection and/or diagnosis of SARS-CoV-2 by FDA under an Emergency Use Authorization (EUA). This EUA will remain  in effect (meaning this test can be used) for the duration of the COVID-19 declaration under Section 564(b)(1) of the Act, 21 U.S.C.section 360bbb-3(b)(1), unless the authorization is terminated  or revoked sooner.       Influenza A by PCR NEGATIVE NEGATIVE Final   Influenza B by PCR NEGATIVE NEGATIVE Final    Comment:  (NOTE) The Xpert Xpress SARS-CoV-2/FLU/RSV plus assay is intended as an aid in the diagnosis of influenza from Nasopharyngeal swab specimens and should not be used as a sole basis for treatment. Nasal washings and aspirates are unacceptable for Xpert Xpress SARS-CoV-2/FLU/RSV testing.  Fact Sheet for Patients: EntrepreneurPulse.com.au  Fact Sheet for Healthcare Providers: IncredibleEmployment.be  This test is not yet approved or cleared by the [place] FDA and has been authorized for detection and/or diagnosis of SARS-CoV-2 by FDA under an Emergency Use Authorization (EUA). This EUA will remain in effect (meaning this test can be used) for the duration of the COVID-19 declaration under Section 564(b)(1) of the Act, 21 U.S.C. section 360bbb-3(b)(1), unless the authorization is terminated or revoked.  Performed at [facility], [place] [address] [place], [place] [address]   MRSA Next Gen by PCR, Nasal     Status: Abnormal   Collection Time: 06/23/22  5:53 PM   Specimen: Nasal Mucosa; Nasal Swab  Result Value Ref Range Status   MRSA by PCR Next Gen DETECTED (A) NOT DETECTED Final    Comment: CRITICAL RESULT CALLED TO, READ BACK BY AND VERIFIED WITH: BLOCK,D. 06/23/22 @1908  BY SEEL,M. (NOTE) The GeneXpert MRSA Assay (FDA approved for NASAL specimens only), is one component of a comprehensive MRSA colonization surveillance program. It is not intended to diagnose MRSA infection nor to guide or monitor treatment for MRSA infections. Test performance is not FDA approved in patients less than 44 years old. Performed at [facility], [place] [facility] [place] [address] [place], [place] [address]   Culture, blood (Routine X 2) w Reflex to ID Panel     Status: None (Preliminary result)   Collection Time: 06/25/22  6:49 AM   Specimen: BLOOD  Result Value Ref Range Status   Specimen Description   Final    BLOOD Blood Culture  results may not be optimal due to an inadequate volume of blood received in culture bottles Performed at [facility] [place] [address], [place], [place] [address]    Special Requests   Final    BLOOD RIGHT HAND AEROBIC BOTTLE ONLY Performed at [place] [address] [place], [place] [address]    Culture   Final    NO GROWTH 3 DAYS Performed at [facility], [place] [address], [place], [place] [address]    Report Status PENDING  Incomplete    Radiology Studies: No results found.  Scheduled Meds:  [MAR Hold] (feeding supplement) PROSource Plus  30 mL Oral Daily   [MAR Hold] ARIPiprazole  15 mg Oral Daily   [MAR Hold] Chlorhexidine Gluconate Cloth  6 each Topical Daily   [MAR Hold] feeding supplement  237 mL Oral BID BM   [MAR Hold] midodrine  5 mg Oral BID WC   [MAR Hold] pantoprazole  40 mg Oral Daily   [MAR Hold] pregabalin  150 mg Oral BID   [MAR Hold] senna-docusate  1 tablet Oral BID   [MAR Hold] sertraline  25 mg Oral Daily   [MAR Hold] thiamine  100 mg Oral Daily   Continuous Infusions:  [MAR Hold] sodium chloride Stopped (06/27/22 1207)   sodium chloride     dextrose 5% lactated ringers 75 mL/hr at 06/28/22 0800   [MAR Hold] norepinephrine (LEVOPHED) Adult infusion Stopped (06/28/22 0103)   [MAR Hold] vancomycin Stopped (06/28/22 0739)     LOS: 5 days    Time spent: 35 mins    Ethelda Deangelo, MD [facility]   If 7PM-7AM, please contact night-coverage

## 2022-06-28 NOTE — Anesthesia Postprocedure Evaluation (Signed)
Anesthesia Post Note  Patient: Steve [name]  Procedure(s) Performed: TRANSESOPHAGEAL ECHOCARDIOGRAM (TEE)     Patient location during evaluation: Endoscopy Anesthesia Type: MAC Level of consciousness: awake Pain management: pain level controlled Vital Signs Assessment: post-procedure vital signs reviewed and stable Respiratory status: spontaneous breathing Cardiovascular status: stable Postop Assessment: no apparent nausea or vomiting Anesthetic complications: no   No notable events documented.  Last Vitals:  Vitals:   06/28/22 1350 06/28/22 1400  BP: 131/76 130/69  Pulse: (!) 57 (!) 56  Resp: 12 11  Temp:    SpO2: 99% 99%    Last Pain:  Vitals:   06/28/22 1314  TempSrc: Temporal  PainSc:                  Huston Foley

## 2022-06-28 NOTE — Progress Notes (Signed)
[facility]   Reason for visit: Follow up on bacteremia  Interval History: TEE negative for vegetation; remains afebrile Day 7 total antibiotics  Physical Exam: Constitutional:  Vitals:   06/28/22 1350 06/28/22 1400  BP: 131/76 130/69  Pulse: (!) 57 (!) 56  Resp: 12 11  Temp:    SpO2: 99% 99%   patient appears in NAD Respiratory: Normal respiratory effort  Review of Systems: Constitutional: negative for fevers and chills  Lab Results  Component Value Date   WBC 3.5 (L) 06/28/2022   HGB 8.2 (L) 06/28/2022   HCT 27.5 (L) 06/28/2022   MCV 88.7 06/28/2022   PLT 293 06/28/2022    Lab Results  Component Value Date   CREATININE 0.46 (L) 06/27/2022   BUN 8 06/27/2022   NA 138 06/27/2022   K 4.1 06/27/2022   CL 106 06/27/2022   CO2 28 06/27/2022    Lab Results  Component Value Date   ALT 8 06/27/2022   AST 9 (L) 06/27/2022   ALKPHOS 77 06/27/2022     Microbiology: Recent Results (from the past 240 hour(s))  Blood Culture (routine x 2)     Status: Abnormal   Collection Time: 06/23/22  4:23 AM   Specimen: BLOOD  Result Value Ref Range Status   Specimen Description   Final    BLOOD RIGHT ANTECUBITAL Performed at [facility], [address] [place], [place] [address]    Special Requests   Final    BOTTLES DRAWN AEROBIC AND ANAEROBIC Blood Culture results may not be optimal due to an excessive volume of blood received in culture bottles Performed at [facility], [address], [place], [place] [address]    Culture  Setup Time   Final    AEROBIC BOTTLE ONLY GRAM POSITIVE COCCI IN CLUSTERS CRITICAL RESULT CALLED TO, READ BACK BY AND VERIFIED WITH:  C/ PHARMD E. JACKSON 06/23/22 2250 A. LAFRANCE  Performed at [facility], [address] [place], [place] [address]    Culture METHICILLIN RESISTANT STAPHYLOCOCCUS AUREUS (A)  Final   Report Status 06/25/2022 FINAL  Final   Organism ID,  Bacteria METHICILLIN RESISTANT STAPHYLOCOCCUS AUREUS  Final      Susceptibility   Methicillin resistant staphylococcus aureus - MIC*    CIPROFLOXACIN >=8 RESISTANT Resistant     ERYTHROMYCIN >=8 RESISTANT Resistant     GENTAMICIN <=0.5 SENSITIVE Sensitive     OXACILLIN >=4 RESISTANT Resistant     TETRACYCLINE >=16 RESISTANT Resistant     VANCOMYCIN 1 SENSITIVE Sensitive     TRIMETH/SULFA >=320 RESISTANT Resistant     CLINDAMYCIN RESISTANT Resistant     RIFAMPIN <=0.5 SENSITIVE Sensitive     Inducible Clindamycin POSITIVE Resistant     * METHICILLIN RESISTANT STAPHYLOCOCCUS AUREUS  Urine Culture     Status: Abnormal   Collection Time: 06/23/22  4:23 AM   Specimen: In/Out Cath Urine  Result Value Ref Range Status   Specimen Description   Final    IN/OUT CATH URINE Performed at [facility], [address], [place], [place] [address]    Special Requests   Final    Normal Performed at [facility], [address], [place], [place] [address]    Culture >=100,000 COLONIES/mL ESCHERICHIA COLI (A)  Final   Report Status 06/25/2022 FINAL  Final   Organism ID, Bacteria ESCHERICHIA COLI (A)  Final      Susceptibility  Escherichia coli - MIC*    AMPICILLIN <=2 SENSITIVE Sensitive     CEFAZOLIN <=4 SENSITIVE Sensitive     CEFEPIME <=0.12 SENSITIVE Sensitive     CEFTRIAXONE <=0.25 SENSITIVE Sensitive     CIPROFLOXACIN >=4 RESISTANT Resistant     GENTAMICIN <=1 SENSITIVE Sensitive     IMIPENEM <=0.25 SENSITIVE Sensitive     NITROFURANTOIN <=16 SENSITIVE Sensitive     TRIMETH/SULFA >=320 RESISTANT Resistant     AMPICILLIN/SULBACTAM <=2 SENSITIVE Sensitive     PIP/TAZO <=4 SENSITIVE Sensitive     * >=100,000 COLONIES/mL ESCHERICHIA COLI  Blood Culture ID Panel (Reflexed)     Status: Abnormal   Collection Time: 06/23/22  4:23 AM  Result Value Ref Range Status   Enterococcus faecalis NOT DETECTED NOT DETECTED Final   Enterococcus Faecium NOT DETECTED  NOT DETECTED Final   Listeria monocytogenes NOT DETECTED NOT DETECTED Final   Staphylococcus species DETECTED (A) NOT DETECTED Final    Comment: CRITICAL RESULT CALLED TO, READ BACK BY AND VERIFIED WITH:  C/ PHARMD E. JACKSON 06/23/22 2250 A. LAFRANCE     Staphylococcus aureus (BCID) DETECTED (A) NOT DETECTED Final    Comment: Methicillin (oxacillin)-resistant Staphylococcus aureus (MRSA). MRSA is predictably resistant to beta-lactam antibiotics (except ceftaroline). Preferred therapy is vancomycin unless clinically contraindicated. Patient requires contact precautions if  hospitalized. CRITICAL RESULT CALLED TO, READ BACK BY AND VERIFIED WITH:  C/ PHARMD E. JACKSON 06/23/22 2250 A. LAFRANCE     Staphylococcus epidermidis NOT DETECTED NOT DETECTED Final   Staphylococcus lugdunensis NOT DETECTED NOT DETECTED Final   Streptococcus species NOT DETECTED NOT DETECTED Final   Streptococcus agalactiae NOT DETECTED NOT DETECTED Final   Streptococcus pneumoniae NOT DETECTED NOT DETECTED Final   Streptococcus pyogenes NOT DETECTED NOT DETECTED Final   A.calcoaceticus-baumannii NOT DETECTED NOT DETECTED Final   Bacteroides fragilis NOT DETECTED NOT DETECTED Final   Enterobacterales NOT DETECTED NOT DETECTED Final   Enterobacter cloacae complex NOT DETECTED NOT DETECTED Final   Escherichia coli NOT DETECTED NOT DETECTED Final   Klebsiella aerogenes NOT DETECTED NOT DETECTED Final   Klebsiella oxytoca NOT DETECTED NOT DETECTED Final   Klebsiella pneumoniae NOT DETECTED NOT DETECTED Final   Proteus species NOT DETECTED NOT DETECTED Final   Salmonella species NOT DETECTED NOT DETECTED Final   Serratia marcescens NOT DETECTED NOT DETECTED Final   Haemophilus influenzae NOT DETECTED NOT DETECTED Final   Neisseria meningitidis NOT DETECTED NOT DETECTED Final   Pseudomonas aeruginosa NOT DETECTED NOT DETECTED Final   Stenotrophomonas maltophilia NOT DETECTED NOT DETECTED Final   Candida albicans NOT  DETECTED NOT DETECTED Final   Candida auris NOT DETECTED NOT DETECTED Final   Candida glabrata NOT DETECTED NOT DETECTED Final   Candida krusei NOT DETECTED NOT DETECTED Final   Candida parapsilosis NOT DETECTED NOT DETECTED Final   Candida tropicalis NOT DETECTED NOT DETECTED Final   Cryptococcus neoformans/gattii NOT DETECTED NOT DETECTED Final   Meth resistant mecA/C and MREJ DETECTED (A) NOT DETECTED Final    Comment: CRITICAL RESULT CALLED TO, READ BACK BY AND VERIFIED WITH:  C/ PHARMD E. JACKSON 06/23/22 2250 A. LAFRANCE  Performed at [facility], [address] [place], [place] [address]   Blood Culture (routine x 2)     Status: None   Collection Time: 06/23/22  4:28 AM   Specimen: BLOOD  Result Value Ref Range Status   Specimen Description   Final    BLOOD BLOOD RIGHT HAND Performed at [facility], [address] [place], [place] [address]    Special Requests   Final    BOTTLES DRAWN AEROBIC ONLY Blood Culture adequate volume Performed at [facility], [address], [place], [place] [address]    Culture   Final    NO GROWTH 5 DAYS Performed at [facility], [address] [place], [place] [address]    Report Status 06/28/2022 FINAL  Final  Resp Panel by RT-PCR (Flu A&B, Covid) Anterior Nasal Swab     Status: None   Collection Time: 06/23/22  5:01 AM   Specimen: Anterior Nasal Swab  Result Value Ref Range Status   SARS Coronavirus 2 by RT PCR NEGATIVE NEGATIVE Final    Comment: (NOTE) SARS-CoV-2 target nucleic acids are NOT DETECTED.  The SARS-CoV-2 RNA is generally detectable in upper respiratory specimens during the acute phase of infection. The lowest concentration of SARS-CoV-2 viral copies this assay can detect is 138 copies/mL. A negative result does not preclude SARS-Cov-2 infection and should not be used as the sole basis for treatment or other patient management decisions. A negative result may occur  with  improper specimen collection/handling, submission of specimen other than nasopharyngeal swab, presence of viral mutation(s) within the areas targeted by this assay, and inadequate number of viral copies(<138 copies/mL). A negative result must be combined with clinical observations, patient history, and epidemiological information. The expected result is Negative.  Fact Sheet for Patients:  BloggerCourse.com  Fact Sheet for Healthcare Providers:  SeriousBroker.it  This test is no t yet approved or cleared by the [place] FDA and  has been authorized for detection and/or diagnosis of SARS-CoV-2 by FDA under an Emergency Use Authorization (EUA). This EUA will remain  in effect (meaning this test can be used) for the duration of the COVID-19 declaration under Section 564(b)(1) of the Act, 21 U.S.C.section 360bbb-3(b)(1), unless the authorization is terminated  or revoked sooner.       Influenza A by PCR NEGATIVE NEGATIVE Final   Influenza B by PCR NEGATIVE NEGATIVE Final    Comment: (NOTE) The Xpert Xpress SARS-CoV-2/FLU/RSV plus assay is intended as an aid in the diagnosis of influenza from Nasopharyngeal swab specimens and should not be used as a sole basis for treatment. Nasal washings and aspirates are unacceptable for Xpert Xpress SARS-CoV-2/FLU/RSV testing.  Fact Sheet for Patients: BloggerCourse.com  Fact Sheet for Healthcare Providers: SeriousBroker.it  This test is not yet approved or cleared by the [place] FDA and has been authorized for detection and/or diagnosis of SARS-CoV-2 by FDA under an Emergency Use Authorization (EUA). This EUA will remain in effect (meaning this test can be used) for the duration of the COVID-19 declaration under Section 564(b)(1) of the Act, 21 U.S.C. section 360bbb-3(b)(1), unless the authorization is terminated  or revoked.  Performed at [facility], [address], [place], [place] [address]   MRSA Next Gen by PCR, Nasal     Status: Abnormal   Collection Time: 06/23/22  5:53 PM   Specimen: Nasal Mucosa; Nasal Swab  Result Value Ref Range Status   MRSA by PCR Next Gen DETECTED (A) NOT DETECTED Final    Comment: CRITICAL RESULT CALLED TO, READ BACK BY AND VERIFIED WITH: BLOCK,D. 06/23/22 @1908  BY SEEL,M. (NOTE) The GeneXpert MRSA Assay (FDA approved for NASAL specimens only), is one component of a comprehensive MRSA colonization surveillance program. It is not intended to diagnose MRSA infection nor to guide or monitor  treatment for MRSA infections. Test performance is not FDA approved in patients less than 26 years old. Performed at [facility], [place] [address], [place], [place] [address]   Culture, blood (Routine X 2) w Reflex to ID Panel     Status: None (Preliminary result)   Collection Time: 06/25/22  6:49 AM   Specimen: BLOOD  Result Value Ref Range Status   Specimen Description   Final    BLOOD Blood Culture results may not be optimal due to an inadequate volume of blood received in culture bottles Performed at [facility], [place] [address] [place], [place] [address]    Special Requests   Final    BLOOD RIGHT HAND AEROBIC BOTTLE ONLY Performed at [place] [address], [place], [place] [address]    Culture   Final    NO GROWTH 3 DAYS Performed at [facility], [place] [address], [place], [place] [address]    Report Status PENDING  Incomplete    Impression/Plan:  1. Bacteremia - MRSA positive in 1 bottle in one set, repeat blood cultures negative and he is improved and TEE negative for any vegetation.   Will continue with IV vancomycin through 10/22. Can use oral linezolid if he is discharged prior to that.   2.  Fever - he has remained afebrile > 72 hours.  Continue to monitor  3.   Medication monitoring - creat has remained stable.  Will conitnue to monitor.

## 2022-06-28 NOTE — Interval H&P Note (Signed)
History and Physical Interval Note:  06/28/2022 12:31 PM  Steve [name]  has presented today for surgery, with the diagnosis of BACTERMIA, IV DRUG USE.  The various methods of treatment have been discussed with the patient and family. After consideration of risks, benefits and other options for treatment, the patient has consented to  Procedure(s): TRANSESOPHAGEAL ECHOCARDIOGRAM (TEE) (N/A) as a surgical intervention.  The patient's history has been reviewed, patient examined, no change in status, stable for surgery.  I have reviewed the patient's chart and labs.  Questions were answered to the patient's satisfaction.     Pixie Casino

## 2022-06-28 NOTE — TOC Progression Note (Addendum)
Transition of Care [facility]) - Progression Note    Patient Details  Name: Steve [name] MRN: [id number] Date of Birth: [birth year]  Transition of Care [facility]) CM/SW Contact  Purcell Mouton, RN Phone Number: 06/28/2022, 10:01 AM  Clinical Narrative:     [facility].  Pt is active with [facility] the [employer]. PCP is Dr. Lacinda Axon, [place] [phone] ext [phone].        Expected Discharge Plan and Services                                                 Social Determinants of Health (SDOH) Interventions    Readmission Risk Interventions     No data to display

## 2022-06-28 NOTE — Progress Notes (Signed)
  Echocardiogram Echocardiogram Transesophageal has been performed.  Steve [name] 06/28/2022, 1:58 PM

## 2022-06-28 NOTE — Transfer of Care (Signed)
Immediate Anesthesia Transfer of Care Note  Patient: Steve [name]  Procedure(s) Performed: TRANSESOPHAGEAL ECHOCARDIOGRAM (TEE)  Patient Location: PACU  Anesthesia Type:MAC  Level of Consciousness: drowsy  Airway & Oxygen Therapy: Patient Spontanous Breathing and Patient connected to nasal cannula oxygen  Post-op Assessment: Report given to RN and Post -op Vital signs reviewed and stable  Post vital signs: Reviewed and stable  Last Vitals:  Vitals Value Taken Time  BP    Temp    Pulse    Resp    SpO2      Last Pain:  Vitals:   06/28/22 1201  TempSrc:   PainSc: 5       Patients Stated Pain Goal: 0 (10/29/15 3567)  Complications: No notable events documented.

## 2022-06-28 NOTE — CV Procedure (Signed)
    TRANSESOPHAGEAL ECHOCARDIOGRAM (TEE) NOTE  INDICATIONS: infective endocarditis  PROCEDURE:   Informed consent was obtained prior to the procedure. The risks, benefits and alternatives for the procedure were discussed and the patient comprehended these risks.  Risks include, but are not limited to, cough, sore throat, vomiting, nausea, somnolence, esophageal and stomach trauma or perforation, bleeding, low blood pressure, aspiration, pneumonia, infection, trauma to the teeth and death.    After a procedural time-out, the patient was given propofol for sedation by anesthesia. See their separate report.  The patient's heart rate, blood pressure, and oxygen saturation are monitored continuously during the procedure.The oropharynx was anesthetized with topical cetacaine.  The transesophageal probe was inserted in the esophagus and stomach without difficulty and multiple views were obtained.  The patient was kept under observation until the patient left the procedure room.  I was present face-to-face 100% of this time. The patient left the procedure room in stable condition.   Agitated microbubble saline contrast was not administered.  COMPLICATIONS:    There were no immediate complications.  Findings:  LEFT VENTRICLE: The left ventricular wall thickness is normal.  The left ventricular cavity is normal in size. Wall motion is normal.  LVEF is 60-65%.  RIGHT VENTRICLE:  The right ventricle is normal in structure and function without any thrombus or masses.    LEFT ATRIUM:  The left atrium is mildly dilated in size without any thrombus or masses.  There is not spontaneous echo contrast ("smoke") in the left atrium consistent with a low flow state.  LEFT ATRIAL APPENDAGE:  The left atrial appendage is free of any thrombus or masses. The appendage has single lobes. Pulse doppler indicates moderate flow in the appendage.  ATRIAL SEPTUM:  The atrial septum appears intact and is free of thrombus  and/or masses.  There is no evidence for interatrial shunting by color doppler and saline microbubble.  RIGHT ATRIUM:  The right atrium is normal in size and function without any thrombus or masses.  MITRAL VALVE:  The mitral valve is normal in structure and function with  trivial  regurgitation.  There were no vegetations or stenosis.  AORTIC VALVE:  The aortic valve is trileaflet, normal in structure and function with  no  regurgitation.  There were no vegetations or stenosis  TRICUSPID VALVE:  The tricuspid valve is normal in structure and function with  no  regurgitation.  There were no vegetations or stenosis   PULMONIC VALVE:  The pulmonic valve is normal in structure and function with  no  regurgitation.  There were no vegetations or stenosis.   AORTIC ARCH, ASCENDING AND DESCENDING AORTA:  There was no Ron Parker et. Al, 1992) atherosclerosis of the ascending aorta, aortic arch, or proximal descending aorta.  12. PULMONARY VEINS: Anomalous pulmonary venous return was not noted.  13. PERICARDIUM: The pericardium appeared normal and non-thickened.  There is no pericardial effusion.  IMPRESSION:   No endocarditis Trivial MR Mild LAE LVEF 60-65%  RECOMMENDATIONS:     Antibiotics per ID recs for bacteremia.  Time Spent Directly with the Patient:  60 minutes   Pixie Casino, MD, [facility], [place] Director of the [facility]  [facility] Diplomate of the American Board of Clinical Lipidology Attending Cardiologist  Direct Dial: [phone]  Fax: [phone]  Website:  [email or web address] 06/28/2022, 1:31 PM

## 2022-06-29 DIAGNOSIS — R6510 Systemic inflammatory response syndrome (SIRS) of non-infectious origin without acute organ dysfunction: Secondary | ICD-10-CM | POA: Diagnosis not present

## 2022-06-29 LAB — BASIC METABOLIC PANEL
Anion gap: 7 (ref 5–15)
BUN: 9 mg/dL (ref 6–20)
CO2: 29 mmol/L (ref 22–32)
Calcium: 7.8 mg/dL — ABNORMAL LOW (ref 8.9–10.3)
Chloride: 104 mmol/L (ref 98–111)
Creatinine, Ser: 0.5 mg/dL — ABNORMAL LOW (ref 0.61–1.24)
GFR, Estimated: >60 mL/min (ref >60)
Glucose, Bld: 85 mg/dL (ref 70–99)
Potassium: 4.3 mmol/L (ref 3.5–5.1)
Sodium: 140 mmol/L (ref 135–145)

## 2022-06-29 LAB — CBC
HCT: 27.3 % — ABNORMAL LOW (ref 39.0–52.0)
Hemoglobin: 8 g/dL — ABNORMAL LOW (ref 13.0–17.0)
MCH: 26.8 pg (ref 26.0–34.0)
MCHC: 29.3 g/dL — ABNORMAL LOW (ref 30.0–36.0)
MCV: 91.3 fL (ref 80.0–100.0)
Platelets: 289 10*3/uL (ref 150–400)
RBC: 2.99 MIL/uL — ABNORMAL LOW (ref 4.22–5.81)
RDW: 17.5 % — ABNORMAL HIGH (ref 11.5–15.5)
WBC: 4 10*3/uL (ref 4.0–10.5)
nRBC: 0 % (ref 0.0–0.2)

## 2022-06-29 MED ORDER — LACTATED RINGERS IV BOLUS
500.0000 mL | Freq: Once | INTRAVENOUS | Status: AC
  Administered 2022-06-29: 500 mL via INTRAVENOUS

## 2022-06-29 NOTE — Progress Notes (Signed)
PROGRESS NOTE    Steve [name]  UXL:[id number] DOB: April 04, 1991 DOA: 06/23/2022 PCP: [facility]    Brief Narrative:  This 31 years old male with PMH significant for  paraplegia, longstanding psychiatric diagnoses that includes paranoid schizophrenia, and multisubstance use disorder who presents to the hospital with complaints of lower abdominal pain and lower extremity pain. He reported fever and chills at home.  He additionally reports an injury to his left foot approximately 3 days prior to admission with some erythema and swelling and tenderness.  He has chronic unstageable decubitus ulcer on the left thigh that has been having more drainage than usual.  He was found to have septic shock due to Staph aureus bacteremia.  ID, general surgery, PCCM consulted.  He became hypotensive requiring Levophed support.  He is off Levophed.  TEE negative for vegetations.  ID recommended continue vancomycin until 10/22 or linezolid if he is being discharged prior to that.   Assessment & Plan:   Principal Problem:   SIRS (systemic inflammatory response syndrome) (HCC) Active Problems:   Amphetamine and psychostimulant-induced psychosis with hallucinations (HCC)   Hyponatremia   Normocytic anemia   Anxiety and depression   Chronic post-traumatic stress disorder (PTSD)   Malnutrition of moderate degree   History of schizophrenia   Chronic multifocal osteomyelitis, left femur (HCC)   Amphetamine abuse (HCC)   Gastroesophageal reflux disease without esophagitis   Tobacco use disorder   Cellulitis of left foot   Thrombocytosis   Unstageable pressure ulcer of left hip (HCC)   Sepsis due to undetermined organism (HCC)   MRSA bacteremia   Acute cystitis without hematuria   Cellulitis   Quadriplegia (HCC)   Shock circulatory (HCC)  Septic shock due to MRSA bacteremia: Patient presented with septic shock requiring Levophed support.   Blood cultures obtained on 7/7 shows MRSA.  Repeat  blood cultures negative. ID consulted, He was initiated on vancomycin.  Continue vancomycin. Has been weaned off peripheral Levophed on 10/9, but blood pressure still intermittently soft.  Keep in stepdown for now Source may be his chronic decubitus ulcers.    Due to lower back pain, an MRI of the lumbar spine is pending.  He became hypotensive requiring Levophed support 06/27/22 Blood pressure is improved now , off Levophed.  TEE completed negative for vegetation. ID recommended continue vancomycin until 10/22 or change to linezolid if he is being discharged.  E. coli bacteriuria: Patient has been on meropenem.  Denies urinary symptoms.   Substance use disorder /Psychostimulant induced psychosis with hallucinations: Patient has long-term symptoms, history of paranoid schizophrenia.   Psychiatry has evaluated the patient,  for now continue regimen as below.   Apparently he ran out of his Abilify a week prior to admission, and started having auditory and visual hallucinations.   Now hallucinations are resolved.   Hyponatremia > now resolved.   Hypokalemia > replaced.  Resolved.   Normochromic normocytic anemia :likely in the setting of infectious process.   Status post 2 unit of packed red blood cells, hemoglobin improved and remained stable.   Left hip unstageable pressure ulcer: Wound care consulted, surgery following as well, No role for surgical intervention, surgery signed off 10/9   Thrombocytosis > resolved.   Cellulitis of left foot : CT of the foot shows just edema, No evidence of osteomyelitis. Continue antibiotics as above.   Tobacco use disorder -Smoking cessation. Patient was offered and declined nicotine replacement.   Gastroesophageal reflux disease: Continue pantoprazole 40  mg daily   Amphetamine abuse (HCC)- TOC referral   Chronic multifocal osteomyelitis, left femur ID following.  Needs long-term antibiotics.  DVT prophylaxis: SCDs Code Status: Full  code Family Communication: No family at bed side. Disposition Plan:   Status is: Inpatient Remains inpatient appropriate because: Admitted for septic shock secondary to decubitus ulcers requiring IV antibiotics.  Work-up is in progress scheduled for TEE today.  ID and general surgery is on the board.  TEE negative for vegetations.   Anticipated discharge home in 1 to 2 days.  Consultants:  General surgery Infectious diseases Psychiatry PCCM  Procedures: Scheduled TEE today. Antimicrobials:  Anti-infectives (From admission, onward)    Start     Dose/Rate Route Frequency Ordered Stop   06/27/22 1800  vancomycin (VANCOREADY) IVPB 1250 mg/250 mL        1,250 mg 166.7 mL/hr over 90 Minutes Intravenous Every 12 hours 06/27/22 0823     06/24/22 1800  vancomycin (VANCOREADY) IVPB 1500 mg/300 mL  Status:  Discontinued        1,500 mg 150 mL/hr over 120 Minutes Intravenous Every 12 hours 06/24/22 0831 06/27/22 0823   06/23/22 2000  meropenem (MERREM) 1 g in sodium chloride 0.9 % 100 mL IVPB  Status:  Discontinued        1 g 200 mL/hr over 30 Minutes Intravenous Every 8 hours 06/23/22 1804 06/25/22 1028   06/23/22 1800  vancomycin (VANCOREADY) IVPB 1250 mg/250 mL  Status:  Discontinued        1,250 mg 166.7 mL/hr over 90 Minutes Intravenous Every 12 hours 06/23/22 1019 06/24/22 0831   06/23/22 1800  metroNIDAZOLE (FLAGYL) IVPB 500 mg  Status:  Discontinued        500 mg 100 mL/hr over 60 Minutes Intravenous Every 12 hours 06/23/22 1540 06/23/22 1808   06/23/22 1400  ceFEPIme (MAXIPIME) 2 g in sodium chloride 0.9 % 100 mL IVPB  Status:  Discontinued        2 g 200 mL/hr over 30 Minutes Intravenous Every 8 hours 06/23/22 1003 06/23/22 1758   06/23/22 0515  ceFEPIme (MAXIPIME) 2 g in sodium chloride 0.9 % 100 mL IVPB        2 g 200 mL/hr over 30 Minutes Intravenous  Once 06/23/22 0500 06/23/22 0710   06/23/22 0515  metroNIDAZOLE (FLAGYL) IVPB 500 mg        500 mg 100 mL/hr over 60  Minutes Intravenous  Once 06/23/22 0500 06/23/22 0719   06/23/22 0515  vancomycin (VANCOCIN) IVPB 1000 mg/200 mL premix        1,000 mg 200 mL/hr over 60 Minutes Intravenous  Once 06/23/22 0500 06/23/22 0751        Subjective: Patient was seen and examined at bedside.  Overnight events noted. Patient underwent TEE yesterday which was negative for vegetation. Blood pressure has improved.  Levophed has been discontinued.  Objective: Vitals:   06/29/22 0445 06/29/22 0600 06/29/22 0727 06/29/22 0800  BP:  (!) 105/58  122/69  Pulse:  88  71  Resp:  (!) 27  (!) 8  Temp: 98.3 F (36.8 C)  97.9 F (36.6 C)   TempSrc: Oral  Oral   SpO2:  93%  97%  Weight:      Height:        Intake/Output Summary (Last 24 hours) at 06/29/2022 1149 Last data filed at 06/29/2022 0800 Gross per 24 hour  Intake 3130.59 ml  Output 2950 ml  Net 180.59 ml  Filed Weights   06/23/22 0420  Weight: 72.6 kg    Examination:  General exam: Appears comfortable, NAD, deconditioned. Respiratory system: CTA bilaterally, respiratory effort normal, RR 16. Cardiovascular system: S1 & S2 heard, regular rate and rhythm, no murmur. Gastrointestinal system: Abdomen is soft, non tender, non distended, BS+ Central nervous system: Alert and oriented x 3. No focal neurological deficits. Extremities: No edema, no cyanosis, no clubbing. Skin: Unstageable decubitus ulcer with chronic osteomyelitis. Psychiatry: Judgement and insight appear normal. Mood & affect appropriate.    Data Reviewed: I have personally reviewed following labs and imaging studies  CBC: Recent Labs  Lab 06/23/22 0423 06/23/22 1604 06/24/22 1659 06/25/22 [id number] 06/26/22 0308 06/27/22 0438 06/28/22 0923 06/29/22 0309  WBC 9.6   < > 3.9* 4.6 3.5* 3.3* 3.5* 4.0  NEUTROABS 6.9  --  2.2 2.5  --   --   --   --   HGB 7.9*   < > 7.9* 7.9* 8.2* 7.9* 8.2* 8.0*  HCT 25.6*   < > 26.1* 25.6* 27.2* 26.9* 27.5* 27.3*  MCV 83.4   < > 86.1 85.6 86.6  88.8 88.7 91.3  PLT 462*   < > 284 347 286 310 293 289   < > = values in this interval not displayed.   Basic Metabolic Panel: Recent Labs  Lab 06/23/22 1604 06/24/22 0044 06/24/22 1659 06/25/22 0822 06/26/22 0308 06/27/22 0438 06/29/22 0309  NA 134*   < > 135 137 138 138 140  K 3.5   < > 3.4* 4.0 4.3 4.1 4.3  CL 105   < > 106 106 104 106 104  CO2 23   < > 24 26 28 28 29   GLUCOSE 98   < > 101* 100* 103* 90 85  BUN 11   < > 8 6 6 8 9   CREATININE 0.67   < > 0.59* 0.47* 0.50* 0.46* 0.50*  CALCIUM 7.8*   < > 7.3* 7.7* 7.9* 7.8* 7.8*  MG 1.7  --   --  1.8 2.1 2.0  --   PHOS 3.8  --   --   --  2.9  --   --    < > = values in this interval not displayed.   GFR: Estimated Creatinine Clearance: 136.1 mL/min (A) (by C-G formula based on SCr of 0.5 mg/dL (L)). Liver Function Tests: Recent Labs  Lab 06/24/22 0044 06/24/22 1659 06/25/22 0822 06/26/22 0308 06/27/22 0438  AST 6* 8* 8* 8* 9*  ALT 8 8 8 8 8   ALKPHOS 76 67 70 74 77  BILITOT 0.9 0.4 0.4 0.3 0.4  PROT 6.4* 5.7* 6.1* 6.2* 5.9*  ALBUMIN 2.0* 1.8* 2.0* 2.0* 2.0*   No results for input(s): "LIPASE", "AMYLASE" in the last 168 hours. Recent Labs  Lab 06/23/22 1736  AMMONIA 21   Coagulation Profile: Recent Labs  Lab 06/23/22 0423 06/23/22 1736  INR 1.2 1.3*   Cardiac Enzymes: Recent Labs  Lab 06/23/22 1736  CKTOTAL 26*   BNP (last 3 results) No results for input(s): "PROBNP" in the last 8760 hours. HbA1C: No results for input(s): "HGBA1C" in the last 72 hours. CBG: Recent Labs  Lab 06/24/22 0816  GLUCAP 103*   Lipid Profile: No results for input(s): "CHOL", "HDL", "LDLCALC", "TRIG", "CHOLHDL", "LDLDIRECT" in the last 72 hours. Thyroid Function Tests: No results for input(s): "TSH", "T4TOTAL", "FREET4", "T3FREE", "THYROIDAB" in the last 72 hours. Anemia Panel: No results for input(s): "VITAMINB12", "FOLATE", "FERRITIN", "TIBC", "IRON", "RETICCTPCT" in  the last 72 hours. Sepsis Labs: Recent Labs   Lab 06/23/22 0650 06/23/22 1604 06/23/22 1736 06/26/22 0256  LATICACIDVEN 0.9 1.3 0.7 1.3    Recent Results (from the past 240 hour(s))  Blood Culture (routine x 2)     Status: Abnormal   Collection Time: 06/23/22  4:23 AM   Specimen: BLOOD  Result Value Ref Range Status   Specimen Description   Final    BLOOD RIGHT ANTECUBITAL Performed at [facility], [address] [place], [place] [address]    Special Requests   Final    BOTTLES DRAWN AEROBIC AND ANAEROBIC Blood Culture results may not be optimal due to an excessive volume of blood received in culture bottles Performed at [facility], [address] [place], [place] [address]    Culture  Setup Time   Final    AEROBIC BOTTLE ONLY GRAM POSITIVE COCCI IN CLUSTERS CRITICAL RESULT CALLED TO, READ BACK BY AND VERIFIED WITH:  C/ PHARMD E. JACKSON 06/23/22 2250 A. LAFRANCE  Performed at [facility], [address], [place], [place] [address]    Culture METHICILLIN RESISTANT STAPHYLOCOCCUS AUREUS (A)  Final   Report Status 06/25/2022 FINAL  Final   Organism ID, Bacteria METHICILLIN RESISTANT STAPHYLOCOCCUS AUREUS  Final      Susceptibility   Methicillin resistant staphylococcus aureus - MIC*    CIPROFLOXACIN >=8 RESISTANT Resistant     ERYTHROMYCIN >=8 RESISTANT Resistant     GENTAMICIN <=0.5 SENSITIVE Sensitive     OXACILLIN >=4 RESISTANT Resistant     TETRACYCLINE >=16 RESISTANT Resistant     VANCOMYCIN 1 SENSITIVE Sensitive     TRIMETH/SULFA >=320 RESISTANT Resistant     CLINDAMYCIN RESISTANT Resistant     RIFAMPIN <=0.5 SENSITIVE Sensitive     Inducible Clindamycin POSITIVE Resistant     * METHICILLIN RESISTANT STAPHYLOCOCCUS AUREUS  Urine Culture     Status: Abnormal   Collection Time: 06/23/22  4:23 AM   Specimen: In/Out Cath Urine  Result Value Ref Range Status   Specimen Description   Final    IN/OUT CATH URINE Performed at [facility], [address], [place], [place] [address]    Special Requests   Final    Normal Performed at [facility], [address], [place], [place] [address]    Culture >=100,000 COLONIES/mL ESCHERICHIA COLI (A)  Final   Report Status 06/25/2022 FINAL  Final   Organism ID, Bacteria ESCHERICHIA COLI (A)  Final      Susceptibility   Escherichia coli - MIC*    AMPICILLIN <=2 SENSITIVE Sensitive     CEFAZOLIN <=4 SENSITIVE Sensitive     CEFEPIME <=0.12 SENSITIVE Sensitive     CEFTRIAXONE <=0.25 SENSITIVE Sensitive     CIPROFLOXACIN >=4 RESISTANT Resistant     GENTAMICIN <=1 SENSITIVE Sensitive     IMIPENEM <=0.25 SENSITIVE Sensitive     NITROFURANTOIN <=16 SENSITIVE Sensitive     TRIMETH/SULFA >=320 RESISTANT Resistant     AMPICILLIN/SULBACTAM <=2 SENSITIVE Sensitive     PIP/TAZO <=4 SENSITIVE Sensitive     * >=100,000 COLONIES/mL ESCHERICHIA COLI  Blood Culture ID Panel (Reflexed)     Status: Abnormal   Collection Time: 06/23/22  4:23 AM  Result Value Ref Range Status   Enterococcus faecalis NOT DETECTED NOT DETECTED Final   Enterococcus Faecium NOT DETECTED NOT DETECTED Final   Listeria monocytogenes NOT DETECTED NOT DETECTED Final   Staphylococcus species DETECTED (A)  NOT DETECTED Final    Comment: CRITICAL RESULT CALLED TO, READ BACK BY AND VERIFIED WITH:  C/ PHARMD E. JACKSON 06/23/22 2250 A. LAFRANCE     Staphylococcus aureus (BCID) DETECTED (A) NOT DETECTED Final    Comment: Methicillin (oxacillin)-resistant Staphylococcus aureus (MRSA). MRSA is predictably resistant to beta-lactam antibiotics (except ceftaroline). Preferred therapy is vancomycin unless clinically contraindicated. Patient requires contact precautions if  hospitalized. CRITICAL RESULT CALLED TO, READ BACK BY AND VERIFIED WITH:  C/ PHARMD E. JACKSON 06/23/22 2250 A. LAFRANCE     Staphylococcus epidermidis NOT DETECTED NOT DETECTED Final   Staphylococcus lugdunensis NOT DETECTED NOT DETECTED Final    Streptococcus species NOT DETECTED NOT DETECTED Final   Streptococcus agalactiae NOT DETECTED NOT DETECTED Final   Streptococcus pneumoniae NOT DETECTED NOT DETECTED Final   Streptococcus pyogenes NOT DETECTED NOT DETECTED Final   A.calcoaceticus-baumannii NOT DETECTED NOT DETECTED Final   Bacteroides fragilis NOT DETECTED NOT DETECTED Final   Enterobacterales NOT DETECTED NOT DETECTED Final   Enterobacter cloacae complex NOT DETECTED NOT DETECTED Final   Escherichia coli NOT DETECTED NOT DETECTED Final   Klebsiella aerogenes NOT DETECTED NOT DETECTED Final   Klebsiella oxytoca NOT DETECTED NOT DETECTED Final   Klebsiella pneumoniae NOT DETECTED NOT DETECTED Final   Proteus species NOT DETECTED NOT DETECTED Final   Salmonella species NOT DETECTED NOT DETECTED Final   Serratia marcescens NOT DETECTED NOT DETECTED Final   Haemophilus influenzae NOT DETECTED NOT DETECTED Final   Neisseria meningitidis NOT DETECTED NOT DETECTED Final   Pseudomonas aeruginosa NOT DETECTED NOT DETECTED Final   Stenotrophomonas maltophilia NOT DETECTED NOT DETECTED Final   Candida albicans NOT DETECTED NOT DETECTED Final   Candida auris NOT DETECTED NOT DETECTED Final   Candida glabrata NOT DETECTED NOT DETECTED Final   Candida krusei NOT DETECTED NOT DETECTED Final   Candida parapsilosis NOT DETECTED NOT DETECTED Final   Candida tropicalis NOT DETECTED NOT DETECTED Final   Cryptococcus neoformans/gattii NOT DETECTED NOT DETECTED Final   Meth resistant mecA/C and MREJ DETECTED (A) NOT DETECTED Final    Comment: CRITICAL RESULT CALLED TO, READ BACK BY AND VERIFIED WITH:  C/ PHARMD E. JACKSON 06/23/22 2250 A. LAFRANCE  Performed at [facility], [place] [address], [name], [place] [address]   Blood Culture (routine x 2)     Status: None   Collection Time: 06/23/22  4:28 AM   Specimen: BLOOD  Result Value Ref Range Status   Specimen Description   Final    BLOOD BLOOD RIGHT HAND Performed at [place] [address] [place], [place] [address]    Special Requests   Final    BOTTLES DRAWN AEROBIC ONLY Blood Culture adequate volume Performed at [facility] [place] [address], [place], [place] [address]    Culture   Final    NO GROWTH 5 DAYS Performed at [facility], [place] [address], [place], [place] [address]    Report Status 06/28/2022 FINAL  Final  Resp Panel by RT-PCR (Flu A&B, Covid) Anterior Nasal Swab     Status: None   Collection Time: 06/23/22  5:01 AM   Specimen: Anterior Nasal Swab  Result Value Ref Range Status   SARS Coronavirus 2 by RT PCR NEGATIVE NEGATIVE Final    Comment: (NOTE) SARS-CoV-2 target nucleic acids are NOT DETECTED.  The SARS-CoV-2 RNA is generally detectable in upper respiratory specimens during the acute phase of infection. The lowest concentration of  SARS-CoV-2 viral copies this assay can detect is 138 copies/mL. A negative result does not preclude SARS-Cov-2 infection and should not be used as the sole basis for treatment or other patient management decisions. A negative result may occur with  improper specimen collection/handling, submission of specimen other than nasopharyngeal swab, presence of viral mutation(s) within the areas targeted by this assay, and inadequate number of viral copies(<138 copies/mL). A negative result must be combined with clinical observations, patient history, and epidemiological information. The expected result is Negative.  Fact Sheet for Patients:  BloggerCourse.com  Fact Sheet for Healthcare Providers:  SeriousBroker.it  This test is no t yet approved or cleared by the [place] FDA and  has been authorized for detection and/or diagnosis of SARS-CoV-2 by FDA under an Emergency Use Authorization (EUA). This EUA will remain  in effect (meaning this test can be used) for the duration of the COVID-19 declaration under  Section 564(b)(1) of the Act, 21 U.S.C.section 360bbb-3(b)(1), unless the authorization is terminated  or revoked sooner.       Influenza A by PCR NEGATIVE NEGATIVE Final   Influenza B by PCR NEGATIVE NEGATIVE Final    Comment: (NOTE) The Xpert Xpress SARS-CoV-2/FLU/RSV plus assay is intended as an aid in the diagnosis of influenza from Nasopharyngeal swab specimens and should not be used as a sole basis for treatment. Nasal washings and aspirates are unacceptable for Xpert Xpress SARS-CoV-2/FLU/RSV testing.  Fact Sheet for Patients: BloggerCourse.com  Fact Sheet for Healthcare Providers: SeriousBroker.it  This test is not yet approved or cleared by the [place] FDA and has been authorized for detection and/or diagnosis of SARS-CoV-2 by FDA under an Emergency Use Authorization (EUA). This EUA will remain in effect (meaning this test can be used) for the duration of the COVID-19 declaration under Section 564(b)(1) of the Act, 21 U.S.C. section 360bbb-3(b)(1), unless the authorization is terminated or revoked.  Performed at [facility], [address] [place], [place] [address]   MRSA Next Gen by PCR, Nasal     Status: Abnormal   Collection Time: 06/23/22  5:53 PM   Specimen: Nasal Mucosa; Nasal Swab  Result Value Ref Range Status   MRSA by PCR Next Gen DETECTED (A) NOT DETECTED Final    Comment: CRITICAL RESULT CALLED TO, READ BACK BY AND VERIFIED WITH: BLOCK,D. 06/23/22  BY SEEL,M. (NOTE) The GeneXpert MRSA Assay (FDA approved for NASAL specimens only), is one component of a comprehensive MRSA colonization surveillance program. It is not intended to diagnose MRSA infection nor to guide or monitor treatment for MRSA infections. Test performance is not FDA approved in patients less than 71 years old. Performed at [facility], [address] [place], [place] [address]    Culture, blood (Routine X 2) w Reflex to ID Panel     Status: None (Preliminary result)   Collection Time: 06/25/22  6:49 AM   Specimen: BLOOD  Result Value Ref Range Status   Specimen Description   Final    BLOOD Blood Culture results may not be optimal due to an inadequate volume of blood received in culture bottles Performed at [facility], [address], [place], [place] [address]    Special Requests   Final    BLOOD RIGHT HAND AEROBIC BOTTLE ONLY Performed at [facility], [address] [place], [place] [address]    Culture   Final    NO GROWTH 3 DAYS Performed at [facility], [address] [place], [place] [address]    Report Status PENDING  Incomplete    Radiology Studies: ECHO TEE  Result Date: 06/28/2022    TRANSESOPHOGEAL ECHO REPORT   Patient Name:   Steve [name] Date of Exam: 06/28/2022 Medical Rec #:  [id number]       Height:       71.0 in Accession #:    [id number]      Weight:       160.0 lb Date of Birth:  Jan 17, 1991       BSA:          1.918 m Patient Age:    31 years        BP:           140/90 mmHg Patient Gender: M               HR:           73 bpm. Exam Location:  Inpatient Procedure: Transesophageal Echo, Cardiac Doppler and Color Doppler Indications:     Endoscopy  History:         Patient has prior history of Echocardiogram examinations, most                  recent 06/19/2022. Risk Factors:Current Smoker.  Sonographer:     Aron Baba Referring Phys:  Robet Leu, R Diagnosing Phys: Zoila Shutter MD PROCEDURE: After discussion of the risks and benefits of a TEE, an informed consent was obtained from the patient. The transesophogeal probe was passed without difficulty through the esophogus of the patient. Imaged were obtained with the patient in a supine position. Sedation performed by different physician. The patient was monitored while under deep sedation. Anesthestetic sedation was provided intravenously by  Anesthesiology: 255.2mg  of Propofol, 100mg  of Lidocaine. The patient's vital signs; including heart rate, blood pressure, and oxygen saturation; remained stable throughout the procedure. The patient developed no complications during the procedure. IMPRESSIONS  1. Left ventricular ejection fraction, by estimation, is 60 to 65%. The left ventricle has normal function.  2. Right ventricular systolic function is normal. The right ventricular size is normal.  3. Left atrial size was mildly dilated. No left atrial/left atrial appendage thrombus was detected.  4. The mitral valve is grossly normal. Trivial mitral valve regurgitation.  5. The aortic valve is tricuspid. Aortic valve regurgitation is not visualized. Conclusion(s)/Recommendation(s): No evidence of vegetation/infective endocarditis on this transesophageael echocardiogram. FINDINGS  Left Ventricle: Left ventricular ejection fraction, by estimation, is 60 to 65%. The left ventricle has normal function. The left ventricular internal cavity size was normal in size. There is no left ventricular hypertrophy. Right Ventricle: The right ventricular size is normal. No increase in right ventricular wall thickness. Right ventricular systolic function is normal. Left Atrium: Left atrial size was mildly dilated. No left atrial/left atrial appendage thrombus was detected. Right Atrium: Right atrial size was normal in size. Pericardium: There is no evidence of pericardial effusion. Mitral Valve: The mitral valve is grossly normal. Trivial mitral valve regurgitation. Tricuspid Valve: The tricuspid valve is normal in structure. Tricuspid valve regurgitation is not demonstrated. Aortic Valve: The aortic valve is tricuspid. Aortic valve regurgitation is not visualized. Pulmonic Valve: The pulmonic valve was normal in structure. Pulmonic valve regurgitation is not visualized. Aorta: The aortic root and ascending aorta are structurally normal, with no evidence of dilitation.  IAS/Shunts: No atrial level shunt detected by color flow Doppler. MD Electronically signed by  MD Signature Date/Time: 06/28/2022/2:32:23 PM    Final     Scheduled Meds:  (feeding supplement) PROSource Plus  30 mL Oral Daily   ARIPiprazole  15 mg Oral Daily   Chlorhexidine Gluconate Cloth  6 each Topical Daily   feeding supplement  237 mL Oral BID BM   midodrine  5 mg Oral BID WC   pantoprazole  40 mg Oral Daily   pregabalin  150 mg Oral BID   senna-docusate  1 tablet Oral BID   sertraline  25 mg Oral Daily   thiamine  100 mg Oral Daily   Continuous Infusions:  sodium chloride 10 mL/hr at 06/29/22 0800   dextrose 5% lactated ringers 75 mL/hr at 06/29/22 0800   norepinephrine (LEVOPHED) Adult infusion Stopped (06/28/22 0103)   vancomycin Stopped (06/29/22 0724)     LOS: 6 days    Time spent: 35 mins    Tora Prunty, MD [facility]   If 7PM-7AM, please contact night-coverage

## 2022-06-30 DIAGNOSIS — R6510 Systemic inflammatory response syndrome (SIRS) of non-infectious origin without acute organ dysfunction: Secondary | ICD-10-CM | POA: Diagnosis not present

## 2022-06-30 LAB — CBC
HCT: 27.3 % — ABNORMAL LOW (ref 39.0–52.0)
Hemoglobin: 8.1 g/dL — ABNORMAL LOW (ref 13.0–17.0)
MCH: 26.6 pg (ref 26.0–34.0)
MCHC: 29.7 g/dL — ABNORMAL LOW (ref 30.0–36.0)
MCV: 89.8 fL (ref 80.0–100.0)
Platelets: 246 10*3/uL (ref 150–400)
RBC: 3.04 MIL/uL — ABNORMAL LOW (ref 4.22–5.81)
RDW: 17.7 % — ABNORMAL HIGH (ref 11.5–15.5)
WBC: 4 10*3/uL (ref 4.0–10.5)
nRBC: 0 % (ref 0.0–0.2)

## 2022-06-30 LAB — BASIC METABOLIC PANEL
Anion gap: 5 (ref 5–15)
BUN: 11 mg/dL (ref 6–20)
CO2: 31 mmol/L (ref 22–32)
Calcium: 8.1 mg/dL — ABNORMAL LOW (ref 8.9–10.3)
Chloride: 104 mmol/L (ref 98–111)
Creatinine, Ser: 0.58 mg/dL — ABNORMAL LOW (ref 0.61–1.24)
GFR, Estimated: >60 mL/min (ref >60)
Glucose, Bld: 97 mg/dL (ref 70–99)
Potassium: 4 mmol/L (ref 3.5–5.1)
Sodium: 140 mmol/L (ref 135–145)

## 2022-06-30 LAB — CULTURE, BLOOD (ROUTINE X 2): Culture: NO GROWTH

## 2022-06-30 MED ORDER — LINEZOLID 600 MG PO TABS
600.0000 mg | ORAL_TABLET | Freq: Two times a day (BID) | ORAL | Status: DC
  Administered 2022-06-30 – 2022-07-01 (×2): 600 mg via ORAL
  Filled 2022-06-30 (×2): qty 1

## 2022-06-30 NOTE — Progress Notes (Signed)
PROGRESS NOTE    Steve [name]  VWU:[id number] DOB: 1990/09/23 DOA: 06/23/2022 PCP: [facility]    Brief Narrative:  This 31 years old male with PMH significant for  paraplegia, longstanding psychiatric diagnoses that includes paranoid schizophrenia, and multisubstance use disorder who presents to the hospital with complaints of lower abdominal pain and lower extremity pain. He reported fever and chills at home.  He additionally reports an injury to his left foot approximately 3 days prior to admission with some erythema and swelling and tenderness.  He has chronic unstageable decubitus ulcer on the left thigh that has been having more drainage than usual.  He was found to have septic shock due to Staph aureus bacteremia.  ID, general surgery, PCCM consulted.  He became hypotensive requiring Levophed support.  He is off Levophed now.  TEE negative for vegetations.  ID recommended continue vancomycin until 10/22 or linezolid if he is being discharged prior to that.  Assessment & Plan:   Principal Problem:   SIRS (systemic inflammatory response syndrome) (HCC) Active Problems:   Amphetamine and psychostimulant-induced psychosis with hallucinations (HCC)   Hyponatremia   Normocytic anemia   Anxiety and depression   Chronic post-traumatic stress disorder (PTSD)   Malnutrition of moderate degree   History of schizophrenia   Chronic multifocal osteomyelitis, left femur (HCC)   Amphetamine abuse (HCC)   Gastroesophageal reflux disease without esophagitis   Tobacco use disorder   Cellulitis of left foot   Thrombocytosis   Unstageable pressure ulcer of left hip (HCC)   Sepsis due to undetermined organism (HCC)   MRSA bacteremia   Acute cystitis without hematuria   Cellulitis   Quadriplegia (HCC)   Shock circulatory (HCC)  Septic shock due to MRSA bacteremia: Patient presented with septic shock requiring Levophed support.   Blood cultures obtained on 10/7 shows MRSA.   Repeat blood cultures negative. ID consulted, He was initiated on vancomycin.  Continue vancomycin. Has been weaned off peripheral Levophed on 10/9, but blood pressure still intermittently soft.   Source may be his chronic decubitus ulcers.    MRI pelvis shows finding consistent with acute osteomyelitis. He became hypotensive requiring Levophed support 06/27/22 Blood pressure has improved now , off Levophed.  TEE completed negative for vegetation. ID recommended continue vancomycin until 10/22 or change to linezolid if he is being discharged. Antibiotics changed to linezolid.  E. coli bacteriuria: Patient has been on meropenem.  Denies urinary symptoms.   Substance use disorder /Psychostimulant induced psychosis with hallucinations: Patient has long-term symptoms, history of paranoid schizophrenia.   Psychiatry has evaluated the patient,  for now continue regimen as below.   Apparently he ran out of his Abilify a week prior to admission, and started having auditory and visual hallucinations.   Now hallucinations are resolved.   Hyponatremia > now resolved.   Hypokalemia > replaced.  Resolved.   Normochromic normocytic anemia :likely in the setting of infectious process.   Status post 2 unit of packed red blood cells, hemoglobin improved and remained stable.   Left hip unstageable pressure ulcer: Wound care consulted, surgery following as well, No role for surgical intervention, surgery signed off 10/9   Thrombocytosis > resolved.   Cellulitis of left foot : CT of the foot shows just edema, No evidence of osteomyelitis. Continue antibiotics as above.   Tobacco use disorder -Smoking cessation. Patient was offered and declined nicotine replacement.   Gastroesophageal reflux disease: Continue pantoprazole 40 mg daily   Amphetamine abuse (  HCC)- TOC referral   Chronic multifocal osteomyelitis, left femur ID following.  Needs long-term antibiotics.  DVT prophylaxis: SCDs Code  Status: Full code Family Communication: No family at bed side. Disposition Plan:   Status is: Inpatient Remains inpatient appropriate because: Admitted for septic shock secondary to decubitus ulcers requiring IV antibiotics.Work-up is in progress .  ID and general surgery is on the board.  TEE negative for vegetations.   Anticipated discharge home in 1 to 2 days.  Consultants:  General surgery Infectious diseases Psychiatry PCCM  Procedures: TEE negative for vegetations. Antimicrobials:  Anti-infectives (From admission, onward)    Start     Dose/Rate Route Frequency Ordered Stop   06/27/22 1800  vancomycin (VANCOREADY) IVPB 1250 mg/250 mL        1,250 mg 166.7 mL/hr over 90 Minutes Intravenous Every 12 hours 06/27/22 0823 07/08/22 2359   06/24/22 1800  vancomycin (VANCOREADY) IVPB 1500 mg/300 mL  Status:  Discontinued        1,500 mg 150 mL/hr over 120 Minutes Intravenous Every 12 hours 06/24/22 0831 06/27/22 0823   06/23/22 2000  meropenem (MERREM) 1 g in sodium chloride 0.9 % 100 mL IVPB  Status:  Discontinued        1 g 200 mL/hr over 30 Minutes Intravenous Every 8 hours 06/23/22 1804 06/25/22 1028   06/23/22 1800  vancomycin (VANCOREADY) IVPB 1250 mg/250 mL  Status:  Discontinued        1,250 mg 166.7 mL/hr over 90 Minutes Intravenous Every 12 hours 06/23/22 1019 06/24/22 0831   06/23/22 1800  metroNIDAZOLE (FLAGYL) IVPB 500 mg  Status:  Discontinued        500 mg 100 mL/hr over 60 Minutes Intravenous Every 12 hours 06/23/22 1540 06/23/22 1808   06/23/22 1400  ceFEPIme (MAXIPIME) 2 g in sodium chloride 0.9 % 100 mL IVPB  Status:  Discontinued        2 g 200 mL/hr over 30 Minutes Intravenous Every 8 hours 06/23/22 1003 06/23/22 1758   06/23/22 0515  ceFEPIme (MAXIPIME) 2 g in sodium chloride 0.9 % 100 mL IVPB        2 g 200 mL/hr over 30 Minutes Intravenous  Once 06/23/22 0500 06/23/22 0710   06/23/22 0515  metroNIDAZOLE (FLAGYL) IVPB 500 mg        500 mg 100 mL/hr over  60 Minutes Intravenous  Once 06/23/22 0500 06/23/22 0719   06/23/22 0515  vancomycin (VANCOCIN) IVPB 1000 mg/200 mL premix        1,000 mg 200 mL/hr over 60 Minutes Intravenous  Once 06/23/22 0500 06/23/22 0751        Subjective: Patient was seen and examined at bedside.  Overnight events noted. Patient underwent TEE which was negative for vegetations. Blood pressure has improved. His blood pressure remains on the lower side and he remains on midodrine. Patient denies any symptoms.  Objective: Vitals:   06/30/22 0516 06/30/22 0600 06/30/22 0800 06/30/22 0909  BP: (!) 107/46 (!) 113/53 (!) 117/50   Pulse: 65 73 65   Resp: 13 10 15    Temp:   98.9 F (37.2 C) 98 F (36.7 C)  TempSrc:   Oral Oral  SpO2: 97% 96% 97%   Weight:      Height:        Intake/Output Summary (Last 24 hours) at 06/30/2022 1007 Last data filed at 06/30/2022 0700 Gross per 24 hour  Intake 2826.12 ml  Output 5700 ml  Net -2873.88 ml  Filed Weights   06/23/22 0420  Weight: 72.6 kg    Examination:  General exam: Appears comfortable, NAD, deconditioned. Respiratory system: CTA bilaterally, respiratory effort normal, RR 15 Cardiovascular system: S1 & S2 heard, regular rate and rhythm, no murmur. Gastrointestinal system: Abdomen is soft, non tender, non distended, BS+ Central nervous system: Alert and oriented x 3, no focal neurological deficits. Extremities: No edema, no cyanosis, no clubbing. Skin: Unstageable decubitus ulcer with chronic osteomyelitis. Psychiatry: Judgement and insight appear normal. Mood & affect appropriate.    Data Reviewed: I have personally reviewed following labs and imaging studies  CBC: Recent Labs  Lab 06/24/22 1659 06/25/22 0822 06/26/22 0308 06/27/22 0438 06/28/22 0923 06/29/22 0309 06/30/22 0636  WBC 3.9* 4.6 3.5* 3.3* 3.5* 4.0 4.0  NEUTROABS 2.2 2.5  --   --   --   --   --   HGB 7.9* 7.9* 8.2* 7.9* 8.2* 8.0* 8.1*  HCT 26.1* 25.6* 27.2* 26.9* 27.5* 27.3*  27.3*  MCV 86.1 85.6 86.6 88.8 88.7 91.3 89.8  PLT 284 347 286 310 293 289 246   Basic Metabolic Panel: Recent Labs  Lab 06/23/22 1604 06/24/22 0044 06/25/22 0822 06/26/22 0308 06/27/22 0438 06/29/22 0309 06/30/22 0636  NA 134*   < > 137 138 138 140 140  K 3.5   < > 4.0 4.3 4.1 4.3 4.0  CL 105   < > 106 104 106 104 104  CO2 23   < > 26 28 28 29 31   GLUCOSE 98   < > 100* 103* 90 85 97  BUN 11   < > 6 6 8 9 11   CREATININE 0.67   < > 0.47* 0.50* 0.46* 0.50* 0.58*  CALCIUM 7.8*   < > 7.7* 7.9* 7.8* 7.8* 8.1*  MG 1.7  --  1.8 2.1 2.0  --   --   PHOS 3.8  --   --  2.9  --   --   --    < > = values in this interval not displayed.   GFR: Estimated Creatinine Clearance: 136.1 mL/min (A) (by C-G formula based on SCr of 0.58 mg/dL (L)). Liver Function Tests: Recent Labs  Lab 06/24/22 0044 06/24/22 1659 06/25/22 0822 06/26/22 0308 06/27/22 0438  AST 6* 8* 8* 8* 9*  ALT 8 8 8 8 8   ALKPHOS 76 67 70 74 77  BILITOT 0.9 0.4 0.4 0.3 0.4  PROT 6.4* 5.7* 6.1* 6.2* 5.9*  ALBUMIN 2.0* 1.8* 2.0* 2.0* 2.0*   No results for input(s): "LIPASE", "AMYLASE" in the last 168 hours. Recent Labs  Lab 06/23/22 1736  AMMONIA 21   Coagulation Profile: Recent Labs  Lab 06/23/22 1736  INR 1.3*   Cardiac Enzymes: Recent Labs  Lab 06/23/22 1736  CKTOTAL 26*   BNP (last 3 results) No results for input(s): "PROBNP" in the last 8760 hours. HbA1C: No results for input(s): "HGBA1C" in the last 72 hours. CBG: Recent Labs  Lab 06/24/22 0816  GLUCAP 103*   Lipid Profile: No results for input(s): "CHOL", "HDL", "LDLCALC", "TRIG", "CHOLHDL", "LDLDIRECT" in the last 72 hours. Thyroid Function Tests: No results for input(s): "TSH", "T4TOTAL", "FREET4", "T3FREE", "THYROIDAB" in the last 72 hours. Anemia Panel: No results for input(s): "VITAMINB12", "FOLATE", "FERRITIN", "TIBC", "IRON", "RETICCTPCT" in the last 72 hours. Sepsis Labs: Recent Labs  Lab 06/23/22 1604 06/23/22 1736  06/26/22 0256  LATICACIDVEN 1.3 0.7 1.3    Recent Results (from the past 240 hour(s))  Blood Culture (routine x  2)     Status: Abnormal   Collection Time: 06/23/22  4:23 AM   Specimen: BLOOD  Result Value Ref Range Status   Specimen Description   Final    BLOOD RIGHT ANTECUBITAL Performed at [facility], [address] [place], [place] [address]    Special Requests   Final    BOTTLES DRAWN AEROBIC AND ANAEROBIC Blood Culture results may not be optimal due to an excessive volume of blood received in culture bottles Performed at [facility], [address], [place], [place] [address]    Culture  Setup Time   Final    AEROBIC BOTTLE ONLY GRAM POSITIVE COCCI IN CLUSTERS CRITICAL RESULT CALLED TO, READ BACK BY AND VERIFIED WITH:  C/ PHARMD E. JACKSON 06/23/22 2250 A. LAFRANCE  Performed at [facility], [address] [place], [place] [address]    Culture METHICILLIN RESISTANT STAPHYLOCOCCUS AUREUS (A)  Final   Report Status 06/25/2022 FINAL  Final   Organism ID, Bacteria METHICILLIN RESISTANT STAPHYLOCOCCUS AUREUS  Final      Susceptibility   Methicillin resistant staphylococcus aureus - MIC*    CIPROFLOXACIN >=8 RESISTANT Resistant     ERYTHROMYCIN >=8 RESISTANT Resistant     GENTAMICIN <=0.5 SENSITIVE Sensitive     OXACILLIN >=4 RESISTANT Resistant     TETRACYCLINE >=16 RESISTANT Resistant     VANCOMYCIN 1 SENSITIVE Sensitive     TRIMETH/SULFA >=320 RESISTANT Resistant     CLINDAMYCIN RESISTANT Resistant     RIFAMPIN <=0.5 SENSITIVE Sensitive     Inducible Clindamycin POSITIVE Resistant     * METHICILLIN RESISTANT STAPHYLOCOCCUS AUREUS  Urine Culture     Status: Abnormal   Collection Time: 06/23/22  4:23 AM   Specimen: In/Out Cath Urine  Result Value Ref Range Status   Specimen Description   Final    IN/OUT CATH URINE Performed at [facility], [address] [place], [place] [address]    Special  Requests   Final    Normal Performed at [facility], [address] [place], [place] [address]    Culture >=100,000 COLONIES/mL ESCHERICHIA COLI (A)  Final   Report Status 06/25/2022 FINAL  Final   Organism ID, Bacteria ESCHERICHIA COLI (A)  Final      Susceptibility   Escherichia coli - MIC*    AMPICILLIN <=2 SENSITIVE Sensitive     CEFAZOLIN <=4 SENSITIVE Sensitive     CEFEPIME <=0.12 SENSITIVE Sensitive     CEFTRIAXONE <=0.25 SENSITIVE Sensitive     CIPROFLOXACIN >=4 RESISTANT Resistant     GENTAMICIN <=1 SENSITIVE Sensitive     IMIPENEM <=0.25 SENSITIVE Sensitive     NITROFURANTOIN <=16 SENSITIVE Sensitive     TRIMETH/SULFA >=320 RESISTANT Resistant     AMPICILLIN/SULBACTAM <=2 SENSITIVE Sensitive     PIP/TAZO <=4 SENSITIVE Sensitive     * >=100,000 COLONIES/mL ESCHERICHIA COLI  Blood Culture ID Panel (Reflexed)     Status: Abnormal   Collection Time: 06/23/22  4:23 AM  Result Value Ref Range Status   Enterococcus faecalis NOT DETECTED NOT DETECTED Final   Enterococcus Faecium NOT DETECTED NOT DETECTED Final   Listeria monocytogenes NOT DETECTED NOT DETECTED Final   Staphylococcus species DETECTED (A) NOT DETECTED Final    Comment: CRITICAL RESULT CALLED TO, READ BACK BY AND VERIFIED WITH:  C/ PHARMD E. JACKSON 06/23/22 2250 A. LAFRANCE     Staphylococcus aureus (BCID) DETECTED (A) NOT DETECTED Final  Comment: Methicillin (oxacillin)-resistant Staphylococcus aureus (MRSA). MRSA is predictably resistant to beta-lactam antibiotics (except ceftaroline). Preferred therapy is vancomycin unless clinically contraindicated. Patient requires contact precautions if  hospitalized. CRITICAL RESULT CALLED TO, READ BACK BY AND VERIFIED WITH:  C/ PHARMD E. JACKSON 06/23/22 2250 A. LAFRANCE     Staphylococcus epidermidis NOT DETECTED NOT DETECTED Final   Staphylococcus lugdunensis NOT DETECTED NOT DETECTED Final   Streptococcus species NOT DETECTED NOT DETECTED Final    Streptococcus agalactiae NOT DETECTED NOT DETECTED Final   Streptococcus pneumoniae NOT DETECTED NOT DETECTED Final   Streptococcus pyogenes NOT DETECTED NOT DETECTED Final   A.calcoaceticus-baumannii NOT DETECTED NOT DETECTED Final   Bacteroides fragilis NOT DETECTED NOT DETECTED Final   Enterobacterales NOT DETECTED NOT DETECTED Final   Enterobacter cloacae complex NOT DETECTED NOT DETECTED Final   Escherichia coli NOT DETECTED NOT DETECTED Final   Klebsiella aerogenes NOT DETECTED NOT DETECTED Final   Klebsiella oxytoca NOT DETECTED NOT DETECTED Final   Klebsiella pneumoniae NOT DETECTED NOT DETECTED Final   Proteus species NOT DETECTED NOT DETECTED Final   Salmonella species NOT DETECTED NOT DETECTED Final   Serratia marcescens NOT DETECTED NOT DETECTED Final   Haemophilus influenzae NOT DETECTED NOT DETECTED Final   Neisseria meningitidis NOT DETECTED NOT DETECTED Final   Pseudomonas aeruginosa NOT DETECTED NOT DETECTED Final   Stenotrophomonas maltophilia NOT DETECTED NOT DETECTED Final   Candida albicans NOT DETECTED NOT DETECTED Final   Candida auris NOT DETECTED NOT DETECTED Final   Candida glabrata NOT DETECTED NOT DETECTED Final   Candida krusei NOT DETECTED NOT DETECTED Final   Candida parapsilosis NOT DETECTED NOT DETECTED Final   Candida tropicalis NOT DETECTED NOT DETECTED Final   Cryptococcus neoformans/gattii NOT DETECTED NOT DETECTED Final   Meth resistant mecA/C and MREJ DETECTED (A) NOT DETECTED Final    Comment: CRITICAL RESULT CALLED TO, READ BACK BY AND VERIFIED WITH:  C/ PHARMD E. JACKSON 06/23/22 2250 A. LAFRANCE  Performed at [facility], [place] [address] [place], [place] [address]   Blood Culture (routine x 2)     Status: None   Collection Time: 06/23/22  4:28 AM   Specimen: BLOOD  Result Value Ref Range Status   Specimen Description   Final    BLOOD BLOOD RIGHT HAND Performed at [place] [address], [place],  [place] [address]    Special Requests   Final    BOTTLES DRAWN AEROBIC ONLY Blood Culture adequate volume Performed at [place] [address], [place], [place] [address]    Culture   Final    NO GROWTH 5 DAYS Performed at [facility], [place] [address] [place], [place] [address]    Report Status 06/28/2022 FINAL  Final  Resp Panel by RT-PCR (Flu A&B, Covid) Anterior Nasal Swab     Status: None   Collection Time: 06/23/22  5:01 AM   Specimen: Anterior Nasal Swab  Result Value Ref Range Status   SARS Coronavirus 2 by RT PCR NEGATIVE NEGATIVE Final    Comment: (NOTE) SARS-CoV-2 target nucleic acids are NOT DETECTED.  The SARS-CoV-2 RNA is generally detectable in upper respiratory specimens during the acute phase of infection. The lowest concentration of SARS-CoV-2 viral copies this assay can detect is 138 copies/mL. A negative result does not preclude SARS-Cov-2 infection and should not be used as the sole basis for treatment or other patient management decisions. A negative result may occur with  improper specimen collection/handling, submission of specimen other than nasopharyngeal swab, presence of viral mutation(s) within the areas targeted by this assay, and inadequate number of viral copies(<138 copies/mL). A negative result must be combined with clinical observations, patient history, and epidemiological information. The expected result is Negative.  Fact Sheet for Patients:  BloggerCourse.com  Fact Sheet for Healthcare Providers:  SeriousBroker.it  This test is no t yet approved or cleared by the [place] FDA and  has been authorized for detection and/or diagnosis of SARS-CoV-2 by FDA under an Emergency Use Authorization (EUA). This EUA will remain  in effect (meaning this test can be used) for the duration of the COVID-19 declaration under Section 564(b)(1) of the Act, 21 U.S.C.section  360bbb-3(b)(1), unless the authorization is terminated  or revoked sooner.       Influenza A by PCR NEGATIVE NEGATIVE Final   Influenza B by PCR NEGATIVE NEGATIVE Final    Comment: (NOTE) The Xpert Xpress SARS-CoV-2/FLU/RSV plus assay is intended as an aid in the diagnosis of influenza from Nasopharyngeal swab specimens and should not be used as a sole basis for treatment. Nasal washings and aspirates are unacceptable for Xpert Xpress SARS-CoV-2/FLU/RSV testing.  Fact Sheet for Patients: BloggerCourse.com  Fact Sheet for Healthcare Providers: SeriousBroker.it  This test is not yet approved or cleared by the [place] FDA and has been authorized for detection and/or diagnosis of SARS-CoV-2 by FDA under an Emergency Use Authorization (EUA). This EUA will remain in effect (meaning this test can be used) for the duration of the COVID-19 declaration under Section 564(b)(1) of the Act, 21 U.S.C. section 360bbb-3(b)(1), unless the authorization is terminated or revoked.  Performed at [facility], [address] [place], [place] [address]   MRSA Next Gen by PCR, Nasal     Status: Abnormal   Collection Time: 06/23/22  5:53 PM   Specimen: Nasal Mucosa; Nasal Swab  Result Value Ref Range Status   MRSA by PCR Next Gen DETECTED (A) NOT DETECTED Final    Comment: CRITICAL RESULT CALLED TO, READ BACK BY AND VERIFIED WITH: BLOCK,D. 06/23/22  BY SEEL,M. (NOTE) The GeneXpert MRSA Assay (FDA approved for NASAL specimens only), is one component of a comprehensive MRSA colonization surveillance program. It is not intended to diagnose MRSA infection nor to guide or monitor treatment for MRSA infections. Test performance is not FDA approved in patients less than 13 years old. Performed at [facility], [address] [place], [place] [address]   Culture, blood (Routine X 2) w Reflex to ID Panel      Status: None   Collection Time: 06/25/22  6:49 AM   Specimen: BLOOD  Result Value Ref Range Status   Specimen Description   Final    BLOOD Blood Culture results may not be optimal due to an inadequate volume of blood received in culture bottles Performed at [facility], [address] [place], [place] [address]    Special Requests   Final    BLOOD RIGHT HAND AEROBIC BOTTLE ONLY Performed at [facility], [address] [place], [place] [address]    Culture   Final    NO GROWTH 5 DAYS Performed at [facility], [address] [place], [place] [address]    Report Status 06/30/2022 FINAL  Final    Radiology Studies: ECHO TEE  Result Date: 06/28/2022    TRANSESOPHOGEAL ECHO REPORT   Patient Name:   Steve [name]  Date of Exam: 06/28/2022 Medical Rec #:  [id number]       Height:       71.0 in Accession #:    [id number]      Weight:       160.0 lb Date of Birth:  [birth year]       BSA:          1.918 m Patient Age:    31 years        BP:           140/90 mmHg Patient Gender: M               HR:           73 bpm. Exam Location:  Inpatient Procedure: Transesophageal Echo, Cardiac Doppler and Color Doppler Indications:     Endoscopy  History:         Patient has prior history of Echocardiogram examinations, most                  recent 06/19/2022. Risk Factors:Current Smoker.  Sonographer:     Aron BabaNorma Walker Referring Phys:  Robet LeuJOHNSON, KATHLEEN, R Diagnosing Phys: Zoila ShutterKenneth Hilty MD PROCEDURE: After discussion of the risks and benefits of a TEE, an informed consent was obtained from the patient. The transesophogeal probe was passed without difficulty through the esophogus of the patient. Imaged were obtained with the patient in a supine position. Sedation performed by different physician. The patient was monitored while under deep sedation. Anesthestetic sedation was provided intravenously by Anesthesiology: 255.2mg  of Propofol, 100mg  of Lidocaine. The  patient's vital signs; including heart rate, blood pressure, and oxygen saturation; remained stable throughout the procedure. The patient developed no complications during the procedure. IMPRESSIONS  1. Left ventricular ejection fraction, by estimation, is 60 to 65%. The left ventricle has normal function.  2. Right ventricular systolic function is normal. The right ventricular size is normal.  3. Left atrial size was mildly dilated. No left atrial/left atrial appendage thrombus was detected.  4. The mitral valve is grossly normal. Trivial mitral valve regurgitation.  5. The aortic valve is tricuspid. Aortic valve regurgitation is not visualized. Conclusion(s)/Recommendation(s): No evidence of vegetation/infective endocarditis on this transesophageael echocardiogram. FINDINGS  Left Ventricle: Left ventricular ejection fraction, by estimation, is 60 to 65%. The left ventricle has normal function. The left ventricular internal cavity size was normal in size. There is no left ventricular hypertrophy. Right Ventricle: The right ventricular size is normal. No increase in right ventricular wall thickness. Right ventricular systolic function is normal. Left Atrium: Left atrial size was mildly dilated. No left atrial/left atrial appendage thrombus was detected. Right Atrium: Right atrial size was normal in size. Pericardium: There is no evidence of pericardial effusion. Mitral Valve: The mitral valve is grossly normal. Trivial mitral valve regurgitation. Tricuspid Valve: The tricuspid valve is normal in structure. Tricuspid valve regurgitation is not demonstrated. Aortic Valve: The aortic valve is tricuspid. Aortic valve regurgitation is not visualized. Pulmonic Valve: The pulmonic valve was normal in structure. Pulmonic valve regurgitation is not visualized. Aorta: The aortic root and ascending aorta are structurally normal, with no evidence of dilitation. IAS/Shunts: No atrial level shunt detected by color flow Doppler.  Zoila ShutterKenneth Hilty MD Electronically signed by Zoila ShutterKenneth Hilty MD Signature Date/Time: 06/28/2022/2:32:23 PM    Final     Scheduled Meds:  (feeding supplement) PROSource Plus  30 mL Oral Daily   ARIPiprazole  15 mg Oral Daily   Chlorhexidine Gluconate Cloth  6 each Topical  Daily   feeding supplement  237 mL Oral BID BM   midodrine  5 mg Oral BID WC   pantoprazole  40 mg Oral Daily   pregabalin  150 mg Oral BID   senna-docusate  1 tablet Oral BID   sertraline  25 mg Oral Daily   thiamine  100 mg Oral Daily   Continuous Infusions:  sodium chloride 10 mL/hr at 06/30/22 0700   norepinephrine (LEVOPHED) Adult infusion Stopped (06/28/22 0103)   vancomycin Stopped (06/30/22 0647)     LOS: 7 days    Time spent: 35 mins    Kashay Cavenaugh, MD [facility]   If 7PM-7AM, please contact night-coverage

## 2022-06-30 NOTE — Progress Notes (Signed)
Patient transferred to 1606 in NAD. Attached to tele. VSS. Foley in place. Oriented to unit and call bell. All needs within reach.

## 2022-06-30 NOTE — Progress Notes (Signed)
Pharmacy Antibiotic Note  Steve [name] is a 31 y.o. 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  Pharmacy has been consulted for vancomycin dosing. Patient now has MRSA bacteremia. TEE negative for vegetations. Merrem was discontinued 10/9.    Day 8 Vanc - continue abx's thru 10/22 with ID plan to switch to Zyvox if discharged prior to that Afebrile WBC stable SCr stable  Plan: Continue vancomycin to 1250 mg IV q12 (est AUC 493.5)  Measure Vanc levels as needed.  Goal AUC = 400 - 550  Follow up renal function, culture results, and clinical course.   Height: 5\' 11"  (180.3 cm) Weight: 72.6 kg (160 lb) IBW/kg (Calculated) : 75.3  Temp (24hrs), Avg:98.2 F (36.8 C), Min:98 F (36.7 C), Max:98.9 F (37.2 C)  Recent Labs  Lab 06/23/22 1604 06/23/22 1728 06/23/22 1736 06/24/22 0044 06/25/22 9147 06/26/22 0256 06/26/22 0308 06/26/22 2145 06/27/22 0438 06/28/22 0923 06/29/22 0309 06/30/22 0636  WBC 5.8   < >  --    < > 4.6  --  3.5*  --  3.3* 3.5* 4.0 4.0  CREATININE 0.67  --   --    < > 0.47*  --  0.50*  --  0.46*  --  0.50* 0.58*  LATICACIDVEN 1.3  --  0.7  --   --  1.3  --   --   --   --   --   --   VANCOTROUGH  --   --   --   --   --   --   --   --  18  --   --   --   VANCOPEAK  --   --   --   --   --   --   --  31  --   --   --   --    < > = values in this interval not displayed.     Estimated Creatinine Clearance: 136.1 mL/min (A) (by C-G formula based on SCr of 0.58 mg/dL (L)).    Allergies  Allergen Reactions   Caffeine Anaphylaxis   Chocolate Anaphylaxis   Tuberculin, Ppd     Other reaction(s): Eruption of skin   Sulfa Antibiotics Rash    Antimicrobials this admission: 10/7 cefepime x 2 doses 10/7 flagyl x 2 doses 10/7 vancomycin>> 10/7 Meropenem>>10/9    Dose adjustments this admission: 10/11: Vanc 1500 mg IV q12h >> Vanc 1250 mg IV q12h    Microbiology results: 10/9  Bcx: ntgd  10/7 BCx2: 1/3 bottles staph aureus MecA+ 10/7 UCx: >100k E. Coli: R to cipro & Bactrim  Previously: 08/29/21 BCx Providencia rettgeri: R to ALL x cipro, imipenem & pip tazo  Thank you for allowing pharmacy to be a part of this patient's care.  Adrian Saran, PharmD, BCPS Secure Chat if ?s 06/30/2022 9:29 AM

## 2022-07-01 DIAGNOSIS — R6510 Systemic inflammatory response syndrome (SIRS) of non-infectious origin without acute organ dysfunction: Secondary | ICD-10-CM | POA: Diagnosis not present

## 2022-07-01 MED ORDER — LINEZOLID 600 MG PO TABS: 600.0000 mg | ORAL_TABLET | Freq: Two times a day (BID) | ORAL | 0 refills | Status: AC

## 2022-07-01 MED ORDER — OXYCODONE HCL 5 MG/5ML PO SOLN: 5.0000 mg | Freq: Four times a day (QID) | ORAL | 0 refills | Status: AC | PRN

## 2022-07-01 NOTE — TOC Transition Note (Signed)
Transition of Care [facility]) - CM/SW Discharge Note   Patient Details  Name: Steve [name] MRN: [id number] Date of Birth: 02/19/1991  [facility] CM/SW Contact:  Vassie Moselle, LCSW Phone Number: 07/01/2022, 11:38 AM   Clinical Narrative:    Pt is currently active with [facility] for RN services. HHRN order will need to be placed prior to DC. MD notified.    Final next level of care: Home w Home Health Services Barriers to Discharge: No Barriers Identified   Patient Goals and CMS Choice   CMS Medicare.gov Compare Post Acute Care list provided to:: Patient Choice offered to / list presented to : Patient  Discharge Placement                       Discharge Plan and Services                DME Arranged: N/A DME Agency: NA       HH Arranged: RN Pool[place] Agency: [place] ([place]) Date [place]: 07/01/22 Time [place]: 1137 Representative spoke with at [place]: [place] ([place]) Interventions     Readmission Risk Interventions    07/01/2022   10:10 AM 06/29/2022    9:19 AM  Readmission Risk Prevention Plan  Transportation Screening Complete Complete  Medication Review Press photographer) Complete Complete  PCP or Specialist appointment within 3-5 days of discharge Complete Complete  HRI or Home Care Consult Complete Complete  SW Recovery Care/Counseling Consult Complete Complete  Palliative Care Screening Not Applicable Not [place] Not Applicable Not Applicable

## 2022-07-01 NOTE — Plan of Care (Signed)

## 2022-07-01 NOTE — Discharge Instructions (Signed)
Advised to follow-up with wound care as needed. Advised to take linezolid 600 mg twice daily for 7 more days for MRSA bacteremia. Advised to take midodrine for hypotension Patient is being prescribed pain medication for few days advised to follow-up with primary care physician for continuation of pain medicine.

## 2022-07-01 NOTE — Discharge Summary (Signed)
Physician Discharge Summary  Steve [name] [id number] DOB: [birth year] DOA: 06/23/2022  PCP: [facility]  Admit date: 06/23/2022  Discharge date: 07/01/2022  Admitted From: Home.  Disposition:  Home.  Recommendations for Outpatient Follow-up:  Follow up with PCP in 1-2 weeks. Please obtain BMP/CBC in one week. Advised to follow-up with wound care as needed. Advised to take linezolid 600 mg twice daily for 7 more days for MRSA bacteremia. Advised to take midodrine for hypotension Patient is being prescribed pain medication for few days,  advised to follow-up with primary care physician for continuation of pain medicine.  Home Health: Wound care RN Equipment/Devices: None  Discharge Condition: Stable CODE STATUS:Full code Diet recommendation: Regular diet  Brief Summary/ Hospital Course: This 31 years old male with PMH significant for  paraplegia, longstanding psychiatric diagnoses that includes paranoid schizophrenia, and multisubstance use disorder who presents to the hospital with complaints of lower abdominal pain and lower extremity pain. He reported fever and chills at home.  He additionally reports an injury to his left foot approximately 3 days prior to admission with some erythema and swelling and tenderness.  He has chronic unstageable decubitus ulcer on the left thigh that has been having more drainage than usual. He was found to have septic shock due to Staph aureus bacteremia.  He was admitted in the ICU.  ID, general surgery, PCCM consulted.  He became hypotensive requiring Levophed support.  He is off Levophed now.  Infectious disease recommended TEE.  TEE negative for vegetations.  ID recommended continue vancomycin until 10/22 or linezolid if he is being discharged prior to that.  Patient remain off Levophed for more than 48 hours.  He is continued on midodrine.  Antibiotic changed to linezolid.  Patient feels better and wants to be discharged.  Patient is  being discharged home on linezolid 600 mg twice daily for 7 more days to complete  antibiotics course as per infectious diseases.  Patient being discharged home.  Discharge Diagnoses:  Principal Problem:   SIRS (systemic inflammatory response syndrome) (HCC) Active Problems:   Amphetamine and psychostimulant-induced psychosis with hallucinations (HCC)   Hyponatremia   Normocytic anemia   Anxiety and depression   Chronic post-traumatic stress disorder (PTSD)   Malnutrition of moderate degree   History of schizophrenia   Chronic multifocal osteomyelitis, left femur (HCC)   Amphetamine abuse (HCC)   Gastroesophageal reflux disease without esophagitis   Tobacco use disorder   Cellulitis of left foot   Thrombocytosis   Unstageable pressure ulcer of left hip (HCC)   Sepsis due to undetermined organism ([place])   MRSA bacteremia   Acute cystitis without hematuria   Cellulitis   Quadriplegia ([place])   Shock circulatory ([place])  Septic shock due to MRSA bacteremia: Patient presented with septic shock requiring Levophed support.   Blood cultures obtained on 10/7 shows MRSA.  Repeat blood cultures negative. ID consulted, He was initiated on vancomycin.  Continue vancomycin. Has been weaned off peripheral Levophed on 10/9, but blood pressure still intermittently soft.   Source may be his chronic decubitus ulcers.    He became hypotensive requiring Levophed support 06/27/22 Blood pressure has improved now , off Levophed.  TEE completed negative for vegetation. ID recommended continue vancomycin until 10/22 or change to linezolid if he is being discharged. Antibiotics changed to linezolid.   E. coli bacteriuria: Patient has been on meropenem.  Denies urinary symptoms.   Substance use disorder /Psychostimulant induced psychosis with hallucinations: Patient has long-term  symptoms, history of paranoid schizophrenia.   Psychiatry has evaluated the patient,  for now continue regimen as below.    Apparently he ran out of his Abilify a week prior to admission, and started having auditory and visual hallucinations.   Now hallucinations are resolved.   Hyponatremia > now resolved.   Hypokalemia > replaced.  Resolved.   Normochromic normocytic anemia :likely in the setting of infectious process.   Status post 2 unit of packed red blood cells, hemoglobin improved and remained stable.   Left hip unstageable pressure ulcer: Wound care consulted, surgery following as well, No role for surgical intervention, surgery signed off 10/9   Thrombocytosis > resolved.   Cellulitis of left foot : CT of the foot shows just edema, No evidence of osteomyelitis. Continue antibiotics as above.   Tobacco use disorder -Smoking cessation. Patient was offered and declined nicotine replacement.   Gastroesophageal reflux disease: Continue pantoprazole 40 mg daily   Amphetamine abuse ([place])- TOC referral   Chronic multifocal osteomyelitis, left femur ID following.  Needs long-term antibiotics.  Discharge Instructions  Discharge Instructions     Call MD for:  difficulty breathing, headache or visual disturbances   Complete by: As directed    Call MD for:  persistant dizziness or light-headedness   Complete by: As directed    Call MD for:  persistant nausea and vomiting   Complete by: As directed    Diet - low sodium heart healthy   Complete by: As directed    Diet Carb Modified   Complete by: As directed    Discharge instructions   Complete by: As directed    Advised to follow-up with primary care physician in 1 week. Advised to follow-up with wound care as needed. Advised to take linezolid 600 mg twice daily for 7 more days for MRSA bacteremia. Advised to take midodrine for hypotension Patient is being prescribed pain medication for few days advised to follow-up with primary care physician for continuation of pain medicine.   Discharge wound care:   Complete by: As directed    Follow-up  wound care.   Increase activity slowly   Complete by: As directed       Allergies as of 07/01/2022       Reactions   Caffeine Anaphylaxis   Chocolate Anaphylaxis   Tuberculin, Ppd    Other reaction(s): Eruption of skin   Sulfa Antibiotics Rash        Medication List     STOP taking these medications    mupirocin ointment 2 % Commonly known as: BACTROBAN   OLANZapine 5 MG tablet Commonly known as: ZyPREXA       TAKE these medications    acetaminophen 325 MG tablet Commonly known as: TYLENOL Take 650 mg by mouth 2 (two) times daily as needed for mild pain.   ARIPiprazole 30 MG tablet Commonly known as: ABILIFY Take 15 mg by mouth daily.   Calcium Carbonate 500 MG Chew Chew 500 mg by mouth 2 (two) times daily as needed (gerd).   linezolid 600 MG tablet Commonly known as: ZYVOX Take 1 tablet (600 mg total) by mouth every 12 (twelve) hours for 7 days.   midodrine 5 MG tablet Commonly known as: PROAMATINE Take 5 mg by mouth 2 (two) times daily as needed (dizziness when sitting up).   omeprazole 40 MG capsule Commonly known as: PRILOSEC Take 40 mg by mouth daily.   oxyCODONE 5 MG/5ML solution Commonly known as: ROXICODONE Take 5 mLs (  5 mg total) by mouth every 6 (six) hours as needed for up to 3 days for severe pain.   pregabalin 150 MG capsule Commonly known as: LYRICA Take 150 mg by mouth 2 (two) times daily.   sertraline 50 MG tablet Commonly known as: ZOLOFT Take 25 mg by mouth daily.   tiZANidine 4 MG tablet Commonly known as: ZANAFLEX Take 4 mg by mouth at bedtime as needed for muscle spasms.   traZODone 50 MG tablet Commonly known as: DESYREL Take 50 mg by mouth at bedtime.   trospium 20 MG tablet Commonly known as: SANCTURA Take 20 mg by mouth 2 (two) times daily.               Discharge Care Instructions  (From admission, onward)           Start     Ordered   07/01/22 0000  Discharge wound care:       Comments:  Follow-up wound care.   07/01/22 1009            [facility] Follow up in 1 week(s).   Specialty: General Practice Contact information: [place] [address] [phone]                Allergies  Allergen Reactions   Caffeine Anaphylaxis   Chocolate Anaphylaxis   Tuberculin, Ppd     Other reaction(s): Eruption of skin   Sulfa Antibiotics Rash    Consultations: Cardiology Infectious diseases General surgery   Procedures/Studies: ECHO TEE  Result Date: 06/28/2022    TRANSESOPHOGEAL ECHO REPORT   Patient Name:   Steve [name] Date of Exam: 06/28/2022 Medical Rec #:  [id number]       Height:       71.0 in Accession #:    [id number]      Weight:       160.0 lb Date of Birth:  [birth year]       BSA:          1.918 m Patient Age:    4 years        BP:           140/90 mmHg Patient Gender: M               HR:           73 bpm. Exam Location:  Inpatient Procedure: Transesophageal Echo, Cardiac Doppler and Color Doppler Indications:     Endoscopy  History:         Patient has prior history of Echocardiogram examinations, most                  recent 06/19/2022. Risk Factors:Current Smoker.  Sonographer:     Greer Pickerel Referring Phys:  Vikki Ports, R Diagnosing Phys: Lyman Bishop MD PROCEDURE: After discussion of the risks and benefits of a TEE, an informed consent was obtained from the patient. The transesophogeal probe was passed without difficulty through the esophogus of the patient. Imaged were obtained with the patient in a supine position. Sedation performed by different physician. The patient was monitored while under deep sedation. Anesthestetic sedation was provided intravenously by Anesthesiology: 255.2mg  of Propofol, 100mg  of Lidocaine. The patient's vital signs; including heart rate, blood pressure, and oxygen saturation; remained stable throughout the procedure. The patient developed no complications during the  procedure. IMPRESSIONS  1. Left ventricular ejection fraction, by estimation, is 60 to 65%. The left  ventricle has normal function.  2. Right ventricular systolic function is normal. The right ventricular size is normal.  3. Left atrial size was mildly dilated. No left atrial/left atrial appendage thrombus was detected.  4. The mitral valve is grossly normal. Trivial mitral valve regurgitation.  5. The aortic valve is tricuspid. Aortic valve regurgitation is not visualized. Conclusion(s)/Recommendation(s): No evidence of vegetation/infective endocarditis on this transesophageael echocardiogram. FINDINGS  Left Ventricle: Left ventricular ejection fraction, by estimation, is 60 to 65%. The left ventricle has normal function. The left ventricular internal cavity size was normal in size. There is no left ventricular hypertrophy. Right Ventricle: The right ventricular size is normal. No increase in right ventricular wall thickness. Right ventricular systolic function is normal. Left Atrium: Left atrial size was mildly dilated. No left atrial/left atrial appendage thrombus was detected. Right Atrium: Right atrial size was normal in size. Pericardium: There is no evidence of pericardial effusion. Mitral Valve: The mitral valve is grossly normal. Trivial mitral valve regurgitation. Tricuspid Valve: The tricuspid valve is normal in structure. Tricuspid valve regurgitation is not demonstrated. Aortic Valve: The aortic valve is tricuspid. Aortic valve regurgitation is not visualized. Pulmonic Valve: The pulmonic valve was normal in structure. Pulmonic valve regurgitation is not visualized. Aorta: The aortic root and ascending aorta are structurally normal, with no evidence of dilitation. IAS/Shunts: No atrial level shunt detected by color flow Doppler. Lyman Bishop MD Electronically signed by Lyman Bishop MD Signature Date/Time: 06/28/2022/2:32:23 PM    Final    ECHOCARDIOGRAM COMPLETE  Result Date: 06/25/2022     ECHOCARDIOGRAM REPORT   Patient Name:   Steve [name] [facility] Date of Exam: 06/25/2022 Medical Rec #:  [id number]       Height:       71.0 in Accession #:    [id number]      Weight:       160.0 lb Date of Birth:  1990-10-01       BSA:          1.918 m Patient Age:    52 years        BP:           128/68 mmHg Patient Gender: M               HR:           62 bpm. Exam Location:  Inpatient Procedure: 2D Echo, Cardiac Doppler and Color Doppler Indications:    Bacteremia  History:        Patient has prior history of Echocardiogram examinations, most                 recent 08/30/2021.  Sonographer:    Jefferey Pica Referring Phys: [id number] [place]  1. Left ventricular ejection fraction, by estimation, is 60 to 65%. The left ventricle has normal function. The left ventricle has no regional wall motion abnormalities. Left ventricular diastolic parameters were normal.  2. Right ventricular systolic function is normal. The right ventricular size is normal.  3. Left atrial size was mildly dilated.  4. The mitral valve is normal in structure. Mild mitral valve regurgitation. No evidence of mitral stenosis.  5. The aortic valve is normal in structure. Aortic valve regurgitation is not visualized. No aortic stenosis is present.  6. The inferior vena cava is normal in size with greater than 50% respiratory variability, suggesting right atrial pressure of 3 mmHg. Comparison(s): Prior images reviewed side by side. The left ventricular function has improved. Conclusion(s)/Recommendation(s):  No evidence of valvular vegetations on this transthoracic echocardiogram. Consider a transesophageal echocardiogram to exclude infective endocarditis if clinically indicated. FINDINGS  Left Ventricle: Left ventricular ejection fraction, by estimation, is 60 to 65%. The left ventricle has normal function. The left ventricle has no regional wall motion abnormalities. The left ventricular internal cavity size was normal in size. There is  no  left ventricular hypertrophy. Left ventricular diastolic parameters were normal. Right Ventricle: The right ventricular size is normal. No increase in right ventricular wall thickness. Right ventricular systolic function is normal. Left Atrium: Left atrial size was mildly dilated. Right Atrium: Right atrial size was normal in size. Pericardium: There is no evidence of pericardial effusion. Mitral Valve: The mitral valve is normal in structure. Mild mitral valve regurgitation. No evidence of mitral valve stenosis. Tricuspid Valve: The tricuspid valve is normal in structure. Tricuspid valve regurgitation is not demonstrated. No evidence of tricuspid stenosis. Aortic Valve: The aortic valve is normal in structure. Aortic valve regurgitation is not visualized. No aortic stenosis is present. Aortic valve peak gradient measures 7.2 mmHg. Pulmonic Valve: The pulmonic valve was normal in structure. Pulmonic valve regurgitation is mild. No evidence of pulmonic stenosis. Aorta: The aortic root is normal in size and structure. Venous: The inferior vena cava is normal in size with greater than 50% respiratory variability, suggesting right atrial pressure of 3 mmHg. IAS/Shunts: No atrial level shunt detected by color flow Doppler.  LEFT VENTRICLE PLAX 2D LVIDd:         5.20 cm   Diastology LVIDs:         3.10 cm   LV e' medial:    12.10 cm/s LV PW:         1.10 cm   LV E/e' medial:  7.6 LV IVS:        1.00 cm   LV e' lateral:   17.80 cm/s LVOT diam:     2.30 cm   LV E/e' lateral: 5.2 LV SV:         109 LV SV Index:   57 LVOT Area:     4.15 cm  RIGHT VENTRICLE RV Basal diam:  3.10 cm RV S prime:     15.00 cm/s TAPSE (M-mode): 2.6 cm LEFT ATRIUM             Index        RIGHT ATRIUM           Index LA diam:        4.60 cm 2.40 cm/m   RA Area:     20.10 cm LA Vol (A2C):   69.0 ml 35.98 ml/m  RA Volume:   63.80 ml  33.27 ml/m LA Vol (A4C):   76.3 ml 39.79 ml/m LA Biplane Vol: 73.1 ml 38.12 ml/m  AORTIC VALVE                  PULMONIC VALVE AV Area (Vmax): 3.98 cm     PV Vmax:       0.85 m/s AV Vmax:        134.50 cm/s  PV Peak grad:  2.9 mmHg AV Peak Grad:   7.2 mmHg LVOT Vmax:      129.00 cm/s LVOT Vmean:     84.400 cm/s LVOT VTI:       0.263 m  AORTA Ao Root diam: 3.40 cm Ao Asc diam:  2.90 cm MITRAL VALVE  TRICUSPID VALVE MV Area (PHT): 3.68 cm    TR Peak grad:   24.4 mmHg MV Decel Time: 206 msec    TR Vmax:        247.00 cm/s MV E velocity: 92.10 cm/s MV A velocity: 45.80 cm/s  SHUNTS MV E/A ratio:  2.01        Systemic VTI:  0.26 m                            Systemic Diam: 2.30 cm Candee Furbish MD Electronically signed by Candee Furbish MD Signature Date/Time: 06/25/2022/10:40:32 AM    Final    CT FOOT LEFT W CONTRAST  Result Date: 06/24/2022 CLINICAL DATA:  Foot trauma 3 days ago. Increased erythema, warmth and tenderness. EXAM: CT OF THE LOWER LEFT EXTREMITY WITH CONTRAST TECHNIQUE: Multidetector CT imaging of the left foot was performed according to the standard protocol following intravenous contrast administration. RADIATION DOSE REDUCTION: This exam was performed according to the departmental dose-optimization program which includes automated exposure control, adjustment of the mA and/or kV according to patient size and/or use of iterative reconstruction technique. CONTRAST:  176mL OMNIPAQUE IOHEXOL 300 MG/ML  SOLN COMPARISON:  Radiographs 06/23/2022 and 07/27/2021. FINDINGS: Bones/Joint/Cartilage The bones appear demineralized. There is relative sclerosis of the talar dome which appears similar to the opposite side. No focal osteochondral lesion identified. No evidence of acute fracture, dislocation or bone destruction. The joint spaces appear preserved, and no significant joint effusions are identified. Ligaments Suboptimally assessed by CT. Muscles and Tendons As evaluated by CT, the ankle tendons appear intact and normally located. No significant tenosynovitis identified in the foot. No focal muscular  abnormalities or abnormal enhancement. Soft tissues Diffuse moderate edematous changes throughout the dorsal subcutaneous fat of the left foot with more diffuse involvement of the subcutaneous tissues surrounding the ankle. No focal fluid collection, abnormal enhancement, foreign body or soft tissue emphysema identified. IMPRESSION: 1. Diffuse edematous changes throughout the subcutaneous tissues of the left foot and ankle without focal fluid collection, foreign body or soft tissue emphysema. 2. No evidence of osteomyelitis or septic arthritis. 3. The bones appear demineralized. Electronically Signed   By: Richardean Sale M.D.   On: 06/24/2022 11:38   DG Foot Complete Left  Result Date: 06/23/2022 CLINICAL DATA:  31 year old male with possible sepsis. Open sores and swelling of left foot. EXAM: LEFT FOOT - COMPLETE 3+ VIEW COMPARISON:  Left ankle series 07/27/2021. FINDINGS: Moderate to severe soft tissue swelling in the left foot especially at the dorsum of the foot and more pronounced distally. No definite soft tissue gas. No radiopaque foreign body identified. Underlying bone mineralization remains within normal limits. No fracture, dislocation, or cortical osteolysis identified. Maintained joint spaces. IMPRESSION: Moderate to severe soft tissue swelling. No tracking soft tissue gas or plain radiographic evidence of osteomyelitis. Electronically Signed   By: Genevie Ann M.D.   On: 06/23/2022 06:03   DG Chest Port 1 View  Result Date: 06/23/2022 CLINICAL DATA:  31 year old male with possible sepsis. EXAM: PORTABLE CHEST 1 VIEW COMPARISON:  Chest radiographs 05/18/2022 and earlier. FINDINGS: Portable AP semi upright views at 0539 hours. Lower lung volumes and kyphotic positioning. Allowing for portable technique the lungs are clear. Normal cardiac size and mediastinal contours. Visualized tracheal air column is within normal limits. Visible bowel gas pattern is within normal limits. Faintly visible anterior  and posterior cervical spine fusion hardware better demonstrated on prior exams. No other  osseous abnormality identified. IMPRESSION: Negative portable chest. Electronically Signed   By: Genevie Ann M.D.   On: 06/23/2022 06:00    Subjective: Patient was seen and examined at bedside.  Overnight events noted.   Patient reports feeling better.  Patient wants to be discharged. Blood pressure remains stable.  Home health services arranged.  Patient being discharged home  Discharge Exam: Vitals:   06/30/22 2112 07/01/22 0500  BP: 113/71 (!) 99/53  Pulse: 89 66  Resp: 16 16  Temp: 98 F (36.7 C) 98.2 F (36.8 C)  SpO2: 98% 99%   Vitals:   06/30/22 1012 06/30/22 1413 06/30/22 2112 07/01/22 0500  BP: 116/66 (!) 97/56 113/71 (!) 99/53  Pulse: 72 70 89 66  Resp: 16 15 16 16   Temp: 97.8 F (36.6 C) 97.9 F (36.6 C) 98 F (36.7 C) 98.2 F (36.8 C)  TempSrc: Oral Oral Oral Oral  SpO2: 98%  98% 99%  Weight:      Height:        General: Pt is alert, awake, not in acute distress Cardiovascular: RRR, S1/S2 +, no rubs, no gallops Respiratory: CTA bilaterally, no wheezing, no rhonchi Abdominal: Soft, NT, ND, bowel sounds + Extremities: no edema, no cyanosis Skin: Unstageable chronic decubitus ulcers.    The results of significant diagnostics from this hospitalization (including imaging, microbiology, ancillary and laboratory) are listed below for reference.     Microbiology: Recent Results (from the past 240 hour(s))  Blood Culture (routine x 2)     Status: Abnormal   Collection Time: 06/23/22  4:23 AM   Specimen: BLOOD  Result Value Ref Range Status   Specimen Description   Final    BLOOD RIGHT ANTECUBITAL Performed at [facility] [address], [place], [place] [address]    Special Requests   Final    BOTTLES DRAWN AEROBIC AND ANAEROBIC Blood Culture results may not be optimal due to an excessive volume of blood received in culture bottles Performed at [facility] [address], [place], [place] [address]    Culture  Setup Time   Final    AEROBIC BOTTLE ONLY GRAM POSITIVE COCCI IN CLUSTERS CRITICAL RESULT CALLED TO, READ BACK BY AND VERIFIED WITH:  C/ PHARMD E. JACKSON 06/23/22 2250 A. LAFRANCE  Performed at [facility], [place] [address] [place], [place] [address]    Culture METHICILLIN RESISTANT STAPHYLOCOCCUS AUREUS (A)  Final   Report Status 06/25/2022 FINAL  Final   Organism ID, Bacteria METHICILLIN RESISTANT STAPHYLOCOCCUS AUREUS  Final      Susceptibility   Methicillin resistant staphylococcus aureus - MIC*    CIPROFLOXACIN >=8 RESISTANT Resistant     ERYTHROMYCIN >=8 RESISTANT Resistant     GENTAMICIN <=0.5 SENSITIVE Sensitive     OXACILLIN >=4 RESISTANT Resistant     TETRACYCLINE >=16 RESISTANT Resistant     VANCOMYCIN 1 SENSITIVE Sensitive     TRIMETH/SULFA >=320 RESISTANT Resistant     CLINDAMYCIN RESISTANT Resistant     RIFAMPIN <=0.5 SENSITIVE Sensitive     Inducible Clindamycin POSITIVE Resistant     * METHICILLIN RESISTANT STAPHYLOCOCCUS AUREUS  Urine Culture     Status: Abnormal   Collection Time: 06/23/22  4:23 AM   Specimen: In/Out Cath Urine  Result Value Ref Range Status   Specimen Description   Final    IN/OUT CATH URINE Performed at [facility] [address] [place], [place] [address]    Special  Requests   Final    Normal Performed at [facility], [place] [address], [place], [place] [address]    Culture >=100,000 COLONIES/mL ESCHERICHIA COLI (A)  Final   Report Status 06/25/2022 FINAL  Final   Organism ID, Bacteria ESCHERICHIA COLI (A)  Final      Susceptibility   Escherichia coli - MIC*    AMPICILLIN <=2 SENSITIVE Sensitive     CEFAZOLIN <=4 SENSITIVE Sensitive     CEFEPIME <=0.12 SENSITIVE Sensitive     CEFTRIAXONE <=0.25 SENSITIVE Sensitive     CIPROFLOXACIN >=4 RESISTANT Resistant     GENTAMICIN <=1 SENSITIVE Sensitive     IMIPENEM  <=0.25 SENSITIVE Sensitive     NITROFURANTOIN <=16 SENSITIVE Sensitive     TRIMETH/SULFA >=320 RESISTANT Resistant     AMPICILLIN/SULBACTAM <=2 SENSITIVE Sensitive     PIP/TAZO <=4 SENSITIVE Sensitive     * >=100,000 COLONIES/mL ESCHERICHIA COLI  Blood Culture ID Panel (Reflexed)     Status: Abnormal   Collection Time: 06/23/22  4:23 AM  Result Value Ref Range Status   Enterococcus faecalis NOT DETECTED NOT DETECTED Final   Enterococcus Faecium NOT DETECTED NOT DETECTED Final   Listeria monocytogenes NOT DETECTED NOT DETECTED Final   Staphylococcus species DETECTED (A) NOT DETECTED Final    Comment: CRITICAL RESULT CALLED TO, READ BACK BY AND VERIFIED WITH:  C/ PHARMD E. JACKSON 06/23/22 2250 A. LAFRANCE     Staphylococcus aureus (BCID) DETECTED (A) NOT DETECTED Final    Comment: Methicillin (oxacillin)-resistant Staphylococcus aureus (MRSA). MRSA is predictably resistant to beta-lactam antibiotics (except ceftaroline). Preferred therapy is vancomycin unless clinically contraindicated. Patient requires contact precautions if  hospitalized. CRITICAL RESULT CALLED TO, READ BACK BY AND VERIFIED WITH:  C/ PHARMD E. JACKSON 06/23/22 2250 A. LAFRANCE     Staphylococcus epidermidis NOT DETECTED NOT DETECTED Final   Staphylococcus lugdunensis NOT DETECTED NOT DETECTED Final   Streptococcus species NOT DETECTED NOT DETECTED Final   Streptococcus agalactiae NOT DETECTED NOT DETECTED Final   Streptococcus pneumoniae NOT DETECTED NOT DETECTED Final   Streptococcus pyogenes NOT DETECTED NOT DETECTED Final   A.calcoaceticus-baumannii NOT DETECTED NOT DETECTED Final   Bacteroides fragilis NOT DETECTED NOT DETECTED Final   Enterobacterales NOT DETECTED NOT DETECTED Final   Enterobacter cloacae complex NOT DETECTED NOT DETECTED Final   Escherichia coli NOT DETECTED NOT DETECTED Final   Klebsiella aerogenes NOT DETECTED NOT DETECTED Final   Klebsiella oxytoca NOT DETECTED NOT DETECTED Final    Klebsiella pneumoniae NOT DETECTED NOT DETECTED Final   Proteus species NOT DETECTED NOT DETECTED Final   Salmonella species NOT DETECTED NOT DETECTED Final   Serratia marcescens NOT DETECTED NOT DETECTED Final   Haemophilus influenzae NOT DETECTED NOT DETECTED Final   Neisseria meningitidis NOT DETECTED NOT DETECTED Final   Pseudomonas aeruginosa NOT DETECTED NOT DETECTED Final   Stenotrophomonas maltophilia NOT DETECTED NOT DETECTED Final   Candida albicans NOT DETECTED NOT DETECTED Final   Candida auris NOT DETECTED NOT DETECTED Final   Candida glabrata NOT DETECTED NOT DETECTED Final   Candida krusei NOT DETECTED NOT DETECTED Final   Candida parapsilosis NOT DETECTED NOT DETECTED Final   Candida tropicalis NOT DETECTED NOT DETECTED Final   Cryptococcus neoformans/gattii NOT DETECTED NOT DETECTED Final   Meth resistant mecA/C and MREJ DETECTED (A) NOT DETECTED Final    Comment: CRITICAL RESULT CALLED TO, READ BACK BY AND VERIFIED WITH:  C/ PHARMD E. JACKSON 06/23/22 2250 A. LAFRANCE  Performed at [facility], [place] [address], [place], [place] [address]   Blood Culture (routine x 2)     Status: None   Collection Time: 06/23/22  4:28 AM   Specimen: BLOOD  Result Value Ref Range Status   Specimen Description   Final    BLOOD BLOOD RIGHT HAND Performed at [facility] [address] [place], [place] [address]    Special Requests   Final    BOTTLES DRAWN AEROBIC ONLY Blood Culture adequate volume Performed at [facility] [address] [place], [place] [address]    Culture   Final    NO GROWTH 5 DAYS Performed at [facility], [place] [address], [place], [place] [address]    Report Status 06/28/2022 FINAL  Final  Resp Panel by RT-PCR (Flu A&B, Covid) Anterior Nasal Swab     Status: None   Collection Time: 06/23/22  5:01 AM   Specimen: Anterior Nasal Swab  Result Value Ref Range Status   SARS Coronavirus 2 by RT PCR NEGATIVE  NEGATIVE Final    Comment: (NOTE) SARS-CoV-2 target nucleic acids are NOT DETECTED.  The SARS-CoV-2 RNA is generally detectable in upper respiratory specimens during the acute phase of infection. The lowest concentration of SARS-CoV-2 viral copies this assay can detect is 138 copies/mL. A negative result does not preclude SARS-Cov-2 infection and should not be used as the sole basis for treatment or other patient management decisions. A negative result may occur with  improper specimen collection/handling, submission of specimen other than nasopharyngeal swab, presence of viral mutation(s) within the areas targeted by this assay, and inadequate number of viral copies(<138 copies/mL). A negative result must be combined with clinical observations, patient history, and epidemiological information. The expected result is Negative.  Fact Sheet for Patients:  EntrepreneurPulse.com.au  Fact Sheet for Healthcare Providers:  IncredibleEmployment.be  This test is no t yet approved or cleared by the [place] FDA and  has been authorized for detection and/or diagnosis of SARS-CoV-2 by FDA under an Emergency Use Authorization (EUA). This EUA will remain  in effect (meaning this test can be used) for the duration of the COVID-19 declaration under Section 564(b)(1) of the Act, 21 U.S.C.section 360bbb-3(b)(1), unless the authorization is terminated  or revoked sooner.       Influenza A by PCR NEGATIVE NEGATIVE Final   Influenza B by PCR NEGATIVE NEGATIVE Final    Comment: (NOTE) The Xpert Xpress SARS-CoV-2/FLU/RSV plus assay is intended as an aid in the diagnosis of influenza from Nasopharyngeal swab specimens and should not be used as a sole basis for treatment. Nasal washings and aspirates are unacceptable for Xpert Xpress SARS-CoV-2/FLU/RSV testing.  Fact Sheet for Patients: EntrepreneurPulse.com.au  Fact Sheet for Healthcare  Providers: IncredibleEmployment.be  This test is not yet approved or cleared by the [place] FDA and has been authorized for detection and/or diagnosis of SARS-CoV-2 by FDA under an Emergency Use Authorization (EUA). This EUA will remain in effect (meaning this test can be used) for the duration of the COVID-19 declaration under Section 564(b)(1) of the Act, 21 U.S.C. section 360bbb-3(b)(1), unless the authorization is terminated or revoked.  Performed at [facility], [place] [address] [place],  [address]   MRSA Next Gen by PCR, Nasal     Status: Abnormal   Collection Time: 06/23/22  5:53 PM   Specimen: Nasal Mucosa; Nasal Swab  Result Value Ref Range Status   MRSA by PCR Next Gen  DETECTED (A) NOT DETECTED Final    Comment: CRITICAL RESULT CALLED TO, READ BACK BY AND VERIFIED WITH: BLOCK,D. 06/23/22 @1908  BY SEEL,M. (NOTE) The GeneXpert MRSA Assay (FDA approved for NASAL specimens only), is one component of a comprehensive MRSA colonization surveillance program. It is not intended to diagnose MRSA infection nor to guide or monitor treatment for MRSA infections. Test performance is not FDA approved in patients less than 70 years old. Performed at [facility], [place] [address], [place], [place] [address]   Culture, blood (Routine X 2) w Reflex to ID Panel     Status: None   Collection Time: 06/25/22  6:49 AM   Specimen: BLOOD  Result Value Ref Range Status   Specimen Description   Final    BLOOD Blood Culture results may not be optimal due to an inadequate volume of blood received in culture bottles Performed at [facility], [place] [address] [place], [place] [address]    Special Requests   Final    BLOOD RIGHT HAND AEROBIC BOTTLE ONLY Performed at [facility] [address], [place], [place] [address]    Culture   Final    NO GROWTH 5 DAYS Performed at [facility], [place] [address], [place], [place] [address]    Report Status 06/30/2022 FINAL  Final     Labs: BNP (last 3 results) No results for input(s): "BNP" in the last 8760 hours. Basic Metabolic Panel: Recent Labs  Lab 06/25/22 0822 06/26/22 0308 06/27/22 0438 06/29/22 0309 06/30/22 0636  NA 137 138 138 140 140  K 4.0 4.3 4.1 4.3 4.0  CL 106 104 106 104 104  CO2 26 28 28 29 31   GLUCOSE 100* 103* 90 85 97  BUN 6 6 8 9 11   CREATININE 0.47* 0.50* 0.46* 0.50* 0.58*  CALCIUM 7.7* 7.9* 7.8* 7.8* 8.1*  MG 1.8 2.1 2.0  --   --   PHOS  --  2.9  --   --   --    Liver Function Tests: Recent Labs  Lab 06/24/22 1659 06/25/22 0822 06/26/22 0308 06/27/22 0438  AST 8* 8* 8* 9*  ALT 8 8 8 8   ALKPHOS 67 70 74 77  BILITOT 0.4 0.4 0.3 0.4  PROT 5.7* 6.1* 6.2* 5.9*  ALBUMIN 1.8* 2.0* 2.0* 2.0*   No results for input(s): "LIPASE", "AMYLASE" in the last 168 hours. No results for input(s): "AMMONIA" in the last 168 hours. CBC: Recent Labs  Lab 06/24/22 1659 06/25/22 0822 06/26/22 0308 06/27/22 0438 06/28/22 0923 06/29/22 0309 06/30/22 0636  WBC 3.9* 4.6 3.5* 3.3* 3.5* 4.0 4.0  NEUTROABS 2.2 2.5  --   --   --   --   --   HGB 7.9* 7.9* 8.2* 7.9* 8.2* 8.0* 8.1*  HCT 26.1* 25.6* 27.2* 26.9* 27.5* 27.3* 27.3*  MCV 86.1 85.6 86.6 88.8 88.7 91.3 89.8  PLT 284 347 286 310 293 289 246   Cardiac Enzymes: No results for input(s): "CKTOTAL", "CKMB", "CKMBINDEX", "TROPONINI" in the last 168 hours. BNP: Invalid input(s): "POCBNP" CBG: No results for input(s): "GLUCAP" in the last 168 hours. D-Dimer No results for input(s): "DDIMER" in the last 72 hours. Hgb A1c No results for input(s): "HGBA1C" in the last 72 hours. Lipid Profile No results for input(s): "CHOL", "HDL", "LDLCALC", "TRIG", "CHOLHDL", "LDLDIRECT" in the last 72 hours. Thyroid function studies No results for input(s): "TSH", "T4TOTAL", "T3FREE", "THYROIDAB" in the last 72 hours.  Invalid  input(s): "FREET3" Anemia  work up No results for input(s): "VITAMINB12", "FOLATE", "FERRITIN", "TIBC", "IRON", "RETICCTPCT" in the last 72 hours. Urinalysis    Component Value Date/Time   COLORURINE AMBER (A) 06/23/2022 0423   APPEARANCEUR HAZY (A) 06/23/2022 0423   APPEARANCEUR Hazy 05/08/2013 0330   LABSPEC 1.028 06/23/2022 0423   LABSPEC 1.034 05/08/2013 0330   PHURINE 5.0 06/23/2022 0423   GLUCOSEU NEGATIVE 06/23/2022 0423   GLUCOSEU Negative 05/08/2013 0330   HGBUR SMALL (A) 06/23/2022 0423   BILIRUBINUR NEGATIVE 06/23/2022 0423   BILIRUBINUR 1+ 05/08/2013 0330   KETONESUR NEGATIVE 06/23/2022 0423   PROTEINUR 100 (A) 06/23/2022 0423   NITRITE POSITIVE (A) 06/23/2022 0423   LEUKOCYTESUR SMALL (A) 06/23/2022 0423   LEUKOCYTESUR Negative 05/08/2013 0330   Sepsis Labs Recent Labs  Lab 06/27/22 0438 06/28/22 0923 06/29/22 0309 06/30/22 0636  WBC 3.3* 3.5* 4.0 4.0   Microbiology Recent Results (from the past 240 hour(s))  Blood Culture (routine x 2)     Status: Abnormal   Collection Time: 06/23/22  4:23 AM   Specimen: BLOOD  Result Value Ref Range Status   Specimen Description   Final    BLOOD RIGHT ANTECUBITAL Performed at [facility], [place] [address], [place], [place] [address]    Special Requests   Final    BOTTLES DRAWN AEROBIC AND ANAEROBIC Blood Culture results may not be optimal due to an excessive volume of blood received in culture bottles Performed at [facility] [address], [place], [place] [address]    Culture  Setup Time   Final    AEROBIC BOTTLE ONLY GRAM POSITIVE COCCI IN CLUSTERS CRITICAL RESULT CALLED TO, READ BACK BY AND VERIFIED WITH:  C/ PHARMD E. JACKSON 06/23/22 2250 A. LAFRANCE  Performed at [facility], [place] [address] [place], [place] [address]    Culture METHICILLIN RESISTANT STAPHYLOCOCCUS AUREUS (A)  Final   Report Status 06/25/2022 FINAL  Final   Organism ID, Bacteria METHICILLIN RESISTANT STAPHYLOCOCCUS AUREUS   Final      Susceptibility   Methicillin resistant staphylococcus aureus - MIC*    CIPROFLOXACIN >=8 RESISTANT Resistant     ERYTHROMYCIN >=8 RESISTANT Resistant     GENTAMICIN <=0.5 SENSITIVE Sensitive     OXACILLIN >=4 RESISTANT Resistant     TETRACYCLINE >=16 RESISTANT Resistant     VANCOMYCIN 1 SENSITIVE Sensitive     TRIMETH/SULFA >=320 RESISTANT Resistant     CLINDAMYCIN RESISTANT Resistant     RIFAMPIN <=0.5 SENSITIVE Sensitive     Inducible Clindamycin POSITIVE Resistant     * METHICILLIN RESISTANT STAPHYLOCOCCUS AUREUS  Urine Culture     Status: Abnormal   Collection Time: 06/23/22  4:23 AM   Specimen: In/Out Cath Urine  Result Value Ref Range Status   Specimen Description   Final    IN/OUT CATH URINE Performed at [facility] [address] [place], [place] [address]    Special Requests   Final    Normal Performed at [facility], [place] [address], [place], [place] [address]    Culture >=100,000 COLONIES/mL ESCHERICHIA COLI (A)  Final   Report Status 06/25/2022 FINAL  Final   Organism ID, Bacteria ESCHERICHIA COLI (A)  Final      Susceptibility   Escherichia coli - MIC*    AMPICILLIN <=2 SENSITIVE Sensitive     CEFAZOLIN <=4 SENSITIVE Sensitive     CEFEPIME <=0.12 SENSITIVE Sensitive     CEFTRIAXONE <=  0.25 SENSITIVE Sensitive     CIPROFLOXACIN >=4 RESISTANT Resistant     GENTAMICIN <=1 SENSITIVE Sensitive     IMIPENEM <=0.25 SENSITIVE Sensitive     NITROFURANTOIN <=16 SENSITIVE Sensitive     TRIMETH/SULFA >=320 RESISTANT Resistant     AMPICILLIN/SULBACTAM <=2 SENSITIVE Sensitive     PIP/TAZO <=4 SENSITIVE Sensitive     * >=100,000 COLONIES/mL ESCHERICHIA COLI  Blood Culture ID Panel (Reflexed)     Status: Abnormal   Collection Time: 06/23/22  4:23 AM  Result Value Ref Range Status   Enterococcus faecalis NOT DETECTED NOT DETECTED Final   Enterococcus Faecium NOT DETECTED NOT DETECTED Final   Listeria monocytogenes NOT  DETECTED NOT DETECTED Final   Staphylococcus species DETECTED (A) NOT DETECTED Final    Comment: CRITICAL RESULT CALLED TO, READ BACK BY AND VERIFIED WITH:  C/ PHARMD E. JACKSON 06/23/22 2250 A. LAFRANCE     Staphylococcus aureus (BCID) DETECTED (A) NOT DETECTED Final    Comment: Methicillin (oxacillin)-resistant Staphylococcus aureus (MRSA). MRSA is predictably resistant to beta-lactam antibiotics (except ceftaroline). Preferred therapy is vancomycin unless clinically contraindicated. Patient requires contact precautions if  hospitalized. CRITICAL RESULT CALLED TO, READ BACK BY AND VERIFIED WITH:  C/ PHARMD E. JACKSON 06/23/22 2250 A. LAFRANCE     Staphylococcus epidermidis NOT DETECTED NOT DETECTED Final   Staphylococcus lugdunensis NOT DETECTED NOT DETECTED Final   Streptococcus species NOT DETECTED NOT DETECTED Final   Streptococcus agalactiae NOT DETECTED NOT DETECTED Final   Streptococcus pneumoniae NOT DETECTED NOT DETECTED Final   Streptococcus pyogenes NOT DETECTED NOT DETECTED Final   A.calcoaceticus-baumannii NOT DETECTED NOT DETECTED Final   Bacteroides fragilis NOT DETECTED NOT DETECTED Final   Enterobacterales NOT DETECTED NOT DETECTED Final   Enterobacter cloacae complex NOT DETECTED NOT DETECTED Final   Escherichia coli NOT DETECTED NOT DETECTED Final   Klebsiella aerogenes NOT DETECTED NOT DETECTED Final   Klebsiella oxytoca NOT DETECTED NOT DETECTED Final   Klebsiella pneumoniae NOT DETECTED NOT DETECTED Final   Proteus species NOT DETECTED NOT DETECTED Final   Salmonella species NOT DETECTED NOT DETECTED Final   Serratia marcescens NOT DETECTED NOT DETECTED Final   Haemophilus influenzae NOT DETECTED NOT DETECTED Final   Neisseria meningitidis NOT DETECTED NOT DETECTED Final   Pseudomonas aeruginosa NOT DETECTED NOT DETECTED Final   Stenotrophomonas maltophilia NOT DETECTED NOT DETECTED Final   Candida albicans NOT DETECTED NOT DETECTED Final   Candida auris NOT  DETECTED NOT DETECTED Final   Candida glabrata NOT DETECTED NOT DETECTED Final   Candida krusei NOT DETECTED NOT DETECTED Final   Candida parapsilosis NOT DETECTED NOT DETECTED Final   Candida tropicalis NOT DETECTED NOT DETECTED Final   Cryptococcus neoformans/gattii NOT DETECTED NOT DETECTED Final   Meth resistant mecA/C and MREJ DETECTED (A) NOT DETECTED Final    Comment: CRITICAL RESULT CALLED TO, READ BACK BY AND VERIFIED WITH:  C/ PHARMD E. JACKSON 06/23/22 2250 A. LAFRANCE  Performed at [facility], [place] [address] [place], [place] [address]   Blood Culture (routine x 2)     Status: None   Collection Time: 06/23/22  4:28 AM   Specimen: BLOOD  Result Value Ref Range Status   Specimen Description   Final    BLOOD BLOOD RIGHT HAND Performed at [facility] [place] [address] [place], [place] [address]    Special Requests   Final    BOTTLES DRAWN AEROBIC ONLY Blood Culture adequate volume Performed at [facility]  [place] [address] [place], [place] [address]    Culture   Final    NO GROWTH 5 DAYS Performed at [facility], [place] [address] [place], [place] [address]    Report Status 06/28/2022 FINAL  Final  Resp Panel by RT-PCR (Flu A&B, Covid) Anterior Nasal Swab     Status: None   Collection Time: 06/23/22  5:01 AM   Specimen: Anterior Nasal Swab  Result Value Ref Range Status   SARS Coronavirus 2 by RT PCR NEGATIVE NEGATIVE Final    Comment: (NOTE) SARS-CoV-2 target nucleic acids are NOT DETECTED.  The SARS-CoV-2 RNA is generally detectable in upper respiratory specimens during the acute phase of infection. The lowest concentration of SARS-CoV-2 viral copies this assay can detect is 138 copies/mL. A negative result does not preclude SARS-Cov-2 infection and should not be used as the sole basis for treatment or other patient management decisions. A negative result may occur with  improper specimen collection/handling,  submission of specimen other than nasopharyngeal swab, presence of viral mutation(s) within the areas targeted by this assay, and inadequate number of viral copies(<138 copies/mL). A negative result must be combined with clinical observations, patient history, and epidemiological information. The expected result is Negative.  Fact Sheet for Patients:  EntrepreneurPulse.com.au  Fact Sheet for Healthcare Providers:  IncredibleEmployment.be  This test is no t yet approved or cleared by the [place] FDA and  has been authorized for detection and/or diagnosis of SARS-CoV-2 by FDA under an Emergency Use Authorization (EUA). This EUA will remain  in effect (meaning this test can be used) for the duration of the COVID-19 declaration under Section 564(b)(1) of the Act, 21 U.S.C.section 360bbb-3(b)(1), unless the authorization is terminated  or revoked sooner.       Influenza A by PCR NEGATIVE NEGATIVE Final   Influenza B by PCR NEGATIVE NEGATIVE Final    Comment: (NOTE) The Xpert Xpress SARS-CoV-2/FLU/RSV plus assay is intended as an aid in the diagnosis of influenza from Nasopharyngeal swab specimens and should not be used as a sole basis for treatment. Nasal washings and aspirates are unacceptable for Xpert Xpress SARS-CoV-2/FLU/RSV testing.  Fact Sheet for Patients: EntrepreneurPulse.com.au  Fact Sheet for Healthcare Providers: IncredibleEmployment.be  This test is not yet approved or cleared by the [place] FDA and has been authorized for detection and/or diagnosis of SARS-CoV-2 by FDA under an Emergency Use Authorization (EUA). This EUA will remain in effect (meaning this test can be used) for the duration of the COVID-19 declaration under Section 564(b)(1) of the Act, 21 U.S.C. section 360bbb-3(b)(1), unless the authorization is terminated or revoked.  Performed at [facility], [place] [address] [place], [place] [address]   MRSA Next Gen by PCR, Nasal     Status: Abnormal   Collection Time: 06/23/22  5:53 PM   Specimen: Nasal Mucosa; Nasal Swab  Result Value Ref Range Status   MRSA by PCR Next Gen DETECTED (A) NOT DETECTED Final    Comment: CRITICAL RESULT CALLED TO, READ BACK BY AND VERIFIED WITH: BLOCK,D. 06/23/22 @1908  BY SEEL,M. (NOTE) The GeneXpert MRSA Assay (FDA approved for NASAL specimens only), is one component of a comprehensive MRSA colonization surveillance program. It is not intended to diagnose MRSA infection nor to guide or monitor treatment for MRSA infections. Test performance is not FDA approved in patients less than 72 years old. Performed at [facility], [place] [address] [place],  [address]  Culture, blood (Routine X 2) w Reflex to ID Panel     Status: None   Collection Time: 06/25/22  6:49 AM   Specimen: BLOOD  Result Value Ref Range Status   Specimen Description   Final    BLOOD Blood Culture results may not be optimal due to an inadequate volume of blood received in culture bottles Performed at [facility], [place] [address], [place], [place] [address]    Special Requests   Final    BLOOD RIGHT HAND AEROBIC BOTTLE ONLY Performed at [facility] [address], [place], [place] [address]    Culture   Final    NO GROWTH 5 DAYS Performed at [facility], [place] [address] [place], [place] [address]    Report Status 06/30/2022 FINAL  Final     Time coordinating discharge: Over 30 minutes  SIGNED:   Shawna Clamp, MD  [facility] 07/01/2022, 10:09 AM Pager   If 7PM-7AM, please contact night-coverage

## 2022-07-01 NOTE — Progress Notes (Signed)
Patient being discharged home with wife. PIV and tele removed per orders. Discharge teaching completed and all questions answered. All personal belongings sent home with patient.

## 2022-07-03 ENCOUNTER — Encounter (HOSPITAL_COMMUNITY): Payer: Self-pay | Admitting: Internal Medicine

## 2023-04-08 ENCOUNTER — Inpatient Hospital Stay (HOSPITAL_COMMUNITY)
Admission: EM | Admit: 2023-04-08 | Discharge: 2023-05-03 | DRG: 853 | Discharge status: Against Medical Advice | Payer: No Typology Code available for payment source | Attending: Internal Medicine | Admitting: Internal Medicine

## 2023-04-08 ENCOUNTER — Emergency Department (HOSPITAL_COMMUNITY): Payer: No Typology Code available for payment source

## 2023-04-08 ENCOUNTER — Other Ambulatory Visit: Payer: Self-pay

## 2023-04-08 ENCOUNTER — Encounter (HOSPITAL_COMMUNITY): Payer: Self-pay

## 2023-04-08 DIAGNOSIS — B9562 Methicillin resistant Staphylococcus aureus infection as the cause of diseases classified elsewhere: Secondary | ICD-10-CM | POA: Diagnosis present

## 2023-04-08 DIAGNOSIS — M86659 Other chronic osteomyelitis, unspecified thigh: Secondary | ICD-10-CM | POA: Diagnosis present

## 2023-04-08 DIAGNOSIS — E872 Acidosis, unspecified: Secondary | ICD-10-CM | POA: Diagnosis present

## 2023-04-08 DIAGNOSIS — L89324 Pressure ulcer of left buttock, stage 4: Secondary | ICD-10-CM | POA: Diagnosis present

## 2023-04-08 DIAGNOSIS — E538 Deficiency of other specified B group vitamins: Secondary | ICD-10-CM | POA: Diagnosis present

## 2023-04-08 DIAGNOSIS — L89892 Pressure ulcer of other site, stage 2: Secondary | ICD-10-CM | POA: Diagnosis present

## 2023-04-08 DIAGNOSIS — Z91018 Allergy to other foods: Secondary | ICD-10-CM

## 2023-04-08 DIAGNOSIS — R532 Functional quadriplegia: Secondary | ICD-10-CM | POA: Diagnosis present

## 2023-04-08 DIAGNOSIS — Z888 Allergy status to other drugs, medicaments and biological substances status: Secondary | ICD-10-CM

## 2023-04-08 DIAGNOSIS — Z882 Allergy status to sulfonamides status: Secondary | ICD-10-CM

## 2023-04-08 DIAGNOSIS — E876 Hypokalemia: Secondary | ICD-10-CM | POA: Diagnosis present

## 2023-04-08 DIAGNOSIS — L89022 Pressure ulcer of left elbow, stage 2: Secondary | ICD-10-CM | POA: Diagnosis present

## 2023-04-08 DIAGNOSIS — K219 Gastro-esophageal reflux disease without esophagitis: Secondary | ICD-10-CM | POA: Diagnosis present

## 2023-04-08 DIAGNOSIS — M24452 Recurrent dislocation, left hip: Secondary | ICD-10-CM | POA: Diagnosis present

## 2023-04-08 DIAGNOSIS — M4628 Osteomyelitis of vertebra, sacral and sacrococcygeal region: Secondary | ICD-10-CM | POA: Diagnosis present

## 2023-04-08 DIAGNOSIS — Z79899 Other long term (current) drug therapy: Secondary | ICD-10-CM

## 2023-04-08 DIAGNOSIS — R64 Cachexia: Secondary | ICD-10-CM | POA: Diagnosis present

## 2023-04-08 DIAGNOSIS — F200 Paranoid schizophrenia: Secondary | ICD-10-CM | POA: Diagnosis present

## 2023-04-08 DIAGNOSIS — L8990 Pressure ulcer of unspecified site, unspecified stage: Secondary | ICD-10-CM | POA: Insufficient documentation

## 2023-04-08 DIAGNOSIS — Z881 Allergy status to other antibiotic agents status: Secondary | ICD-10-CM

## 2023-04-08 DIAGNOSIS — S73015D Posterior dislocation of left hip, subsequent encounter: Secondary | ICD-10-CM

## 2023-04-08 DIAGNOSIS — A4102 Sepsis due to Methicillin resistant Staphylococcus aureus: Principal | ICD-10-CM | POA: Diagnosis present

## 2023-04-08 DIAGNOSIS — L89214 Pressure ulcer of right hip, stage 4: Secondary | ICD-10-CM | POA: Diagnosis present

## 2023-04-08 DIAGNOSIS — F17210 Nicotine dependence, cigarettes, uncomplicated: Secondary | ICD-10-CM | POA: Diagnosis present

## 2023-04-08 DIAGNOSIS — Z818 Family history of other mental and behavioral disorders: Secondary | ICD-10-CM

## 2023-04-08 DIAGNOSIS — D638 Anemia in other chronic diseases classified elsewhere: Secondary | ICD-10-CM | POA: Diagnosis present

## 2023-04-08 DIAGNOSIS — D6489 Other specified anemias: Secondary | ICD-10-CM | POA: Diagnosis present

## 2023-04-08 DIAGNOSIS — D649 Anemia, unspecified: Secondary | ICD-10-CM | POA: Diagnosis not present

## 2023-04-08 DIAGNOSIS — Z6822 Body mass index (BMI) 22.0-22.9, adult: Secondary | ICD-10-CM

## 2023-04-08 DIAGNOSIS — R911 Solitary pulmonary nodule: Secondary | ICD-10-CM | POA: Diagnosis present

## 2023-04-08 DIAGNOSIS — M009 Pyogenic arthritis, unspecified: Secondary | ICD-10-CM | POA: Diagnosis present

## 2023-04-08 DIAGNOSIS — E54 Ascorbic acid deficiency: Secondary | ICD-10-CM | POA: Diagnosis present

## 2023-04-08 DIAGNOSIS — N319 Neuromuscular dysfunction of bladder, unspecified: Secondary | ICD-10-CM | POA: Diagnosis present

## 2023-04-08 DIAGNOSIS — L02416 Cutaneous abscess of left lower limb: Secondary | ICD-10-CM | POA: Diagnosis present

## 2023-04-08 DIAGNOSIS — R7881 Bacteremia: Secondary | ICD-10-CM | POA: Diagnosis present

## 2023-04-08 DIAGNOSIS — A419 Sepsis, unspecified organism: Secondary | ICD-10-CM | POA: Diagnosis present

## 2023-04-08 DIAGNOSIS — L89314 Pressure ulcer of right buttock, stage 4: Secondary | ICD-10-CM | POA: Diagnosis present

## 2023-04-08 DIAGNOSIS — F1510 Other stimulant abuse, uncomplicated: Secondary | ICD-10-CM | POA: Diagnosis present

## 2023-04-08 DIAGNOSIS — A499 Bacterial infection, unspecified: Secondary | ICD-10-CM

## 2023-04-08 DIAGNOSIS — N390 Urinary tract infection, site not specified: Secondary | ICD-10-CM | POA: Diagnosis present

## 2023-04-08 DIAGNOSIS — Z5320 Procedure and treatment not carried out because of patient's decision for unspecified reasons: Secondary | ICD-10-CM | POA: Diagnosis present

## 2023-04-08 DIAGNOSIS — L089 Local infection of the skin and subcutaneous tissue, unspecified: Principal | ICD-10-CM

## 2023-04-08 DIAGNOSIS — A4159 Other Gram-negative sepsis: Secondary | ICD-10-CM | POA: Diagnosis present

## 2023-04-08 DIAGNOSIS — L89520 Pressure ulcer of left ankle, unstageable: Secondary | ICD-10-CM | POA: Diagnosis present

## 2023-04-08 DIAGNOSIS — K651 Peritoneal abscess: Secondary | ICD-10-CM | POA: Diagnosis not present

## 2023-04-08 DIAGNOSIS — R6521 Severe sepsis with septic shock: Secondary | ICD-10-CM | POA: Diagnosis present

## 2023-04-08 DIAGNOSIS — E509 Vitamin A deficiency, unspecified: Secondary | ICD-10-CM | POA: Diagnosis present

## 2023-04-08 DIAGNOSIS — S32402D Unspecified fracture of left acetabulum, subsequent encounter for fracture with routine healing: Secondary | ICD-10-CM

## 2023-04-08 DIAGNOSIS — F4310 Post-traumatic stress disorder, unspecified: Secondary | ICD-10-CM | POA: Diagnosis present

## 2023-04-08 DIAGNOSIS — E871 Hypo-osmolality and hyponatremia: Secondary | ICD-10-CM | POA: Diagnosis present

## 2023-04-08 LAB — CBC WITH DIFFERENTIAL/PLATELET
Abs Immature Granulocytes: 0.08 10*3/uL — ABNORMAL HIGH (ref 0.00–0.07)
Basophils Absolute: 0 10*3/uL (ref 0.0–0.1)
Basophils Relative: 0 %
Eosinophils Absolute: 0 10*3/uL (ref 0.0–0.5)
Eosinophils Relative: 0 %
HCT: 20.8 % — ABNORMAL LOW (ref 39.0–52.0)
Hemoglobin: 6.3 g/dL — CL (ref 13.0–17.0)
Immature Granulocytes: 1 %
Lymphocytes Relative: 7 %
Lymphs Abs: 0.8 10*3/uL (ref 0.7–4.0)
MCH: 26.4 pg (ref 26.0–34.0)
MCHC: 30.3 g/dL (ref 30.0–36.0)
MCV: 87 fL (ref 80.0–100.0)
Monocytes Absolute: 0.4 10*3/uL (ref 0.1–1.0)
Monocytes Relative: 4 %
Neutro Abs: 9.3 10*3/uL — ABNORMAL HIGH (ref 1.7–7.7)
Neutrophils Relative %: 88 %
Platelets: 371 10*3/uL (ref 150–400)
RBC: 2.39 MIL/uL — ABNORMAL LOW (ref 4.22–5.81)
RDW: 18.6 % — ABNORMAL HIGH (ref 11.5–15.5)
WBC: 10.6 10*3/uL — ABNORMAL HIGH (ref 4.0–10.5)
nRBC: 0 % (ref 0.0–0.2)

## 2023-04-08 LAB — COMPREHENSIVE METABOLIC PANEL WITH GFR
ALT: 14 U/L (ref 0–44)
AST: 23 U/L (ref 15–41)
Albumin: <1.5 g/dL — ABNORMAL LOW (ref 3.5–5.0)
Alkaline Phosphatase: 98 U/L (ref 38–126)
Anion gap: 12 (ref 5–15)
BUN: 9 mg/dL (ref 6–20)
CO2: 20 mmol/L — ABNORMAL LOW (ref 22–32)
Calcium: 7.6 mg/dL — ABNORMAL LOW (ref 8.9–10.3)
Chloride: 97 mmol/L — ABNORMAL LOW (ref 98–111)
Creatinine, Ser: 0.83 mg/dL (ref 0.61–1.24)
GFR, Estimated: >60 mL/min
Glucose, Bld: 100 mg/dL — ABNORMAL HIGH (ref 70–99)
Potassium: 3.6 mmol/L (ref 3.5–5.1)
Sodium: 129 mmol/L — ABNORMAL LOW (ref 135–145)
Total Bilirubin: 0.4 mg/dL (ref 0.3–1.2)
Total Protein: 7.2 g/dL (ref 6.5–8.1)

## 2023-04-08 LAB — PROTIME-INR
INR: 1.3 — ABNORMAL HIGH (ref 0.8–1.2)
Prothrombin Time: 16.6 s — ABNORMAL HIGH (ref 11.4–15.2)

## 2023-04-08 LAB — BPAM RBC: Unit Type and Rh: 6200

## 2023-04-08 LAB — I-STAT CG4 LACTIC ACID, ED: Lactic Acid, Venous: 2.8 mmol/L (ref 0.5–1.9)

## 2023-04-08 LAB — LACTIC ACID, PLASMA
Lactic Acid, Venous: 2.4 mmol/L (ref 0.5–1.9)
Lactic Acid, Venous: 2.1 mmol/L (ref 0.5–1.9)

## 2023-04-08 LAB — TYPE AND SCREEN

## 2023-04-08 LAB — PREPARE RBC (CROSSMATCH)

## 2023-04-08 LAB — POC OCCULT BLOOD, ED: Fecal Occult Bld: NEGATIVE

## 2023-04-08 MED ORDER — VANCOMYCIN HCL 1500 MG/300ML IV SOLN
1500.0000 mg | Freq: Once | INTRAVENOUS | Status: AC
  Administered 2023-04-08: 1500 mg via INTRAVENOUS
  Filled 2023-04-08: qty 300

## 2023-04-08 MED ORDER — SODIUM CHLORIDE 0.9 % IV BOLUS
1000.0000 mL | Freq: Once | INTRAVENOUS | Status: AC
  Administered 2023-04-08: 1000 mL via INTRAVENOUS

## 2023-04-08 MED ORDER — FENTANYL CITRATE PF 50 MCG/ML IJ SOSY
50.0000 ug | PREFILLED_SYRINGE | Freq: Once | INTRAMUSCULAR | Status: AC
  Administered 2023-04-09: 50 ug via INTRAVENOUS
  Filled 2023-04-08: qty 1

## 2023-04-08 MED ORDER — NOREPINEPHRINE 4 MG/250ML-% IV SOLN: 0.0000 ug/min | INTRAVENOUS | Status: DC

## 2023-04-08 MED ORDER — PIPERACILLIN-TAZOBACTAM 3.375 G IVPB 30 MIN
3.3750 g | Freq: Once | INTRAVENOUS | Status: AC
  Administered 2023-04-08: 3.375 g via INTRAVENOUS
  Filled 2023-04-08: qty 50

## 2023-04-08 MED ORDER — ALUM & MAG HYDROXIDE-SIMETH 200-200-20 MG/5ML PO SUSP
30.0000 mL | Freq: Once | ORAL | Status: AC
  Administered 2023-04-08: 30 mL via ORAL
  Filled 2023-04-08: qty 30

## 2023-04-08 MED ORDER — PHENYLEPHRINE 80 MCG/ML (10ML) SYRINGE FOR IV PUSH (FOR BLOOD PRESSURE SUPPORT)
80.0000 ug | PREFILLED_SYRINGE | Freq: Once | INTRAVENOUS | Status: AC | PRN
  Administered 2023-04-08: 160 ug via INTRAVENOUS

## 2023-04-08 MED ORDER — NOREPINEPHRINE 4 MG/250ML-% IV SOLN
INTRAVENOUS | Status: AC
  Administered 2023-04-08: 10 ug/min via INTRAVENOUS
  Filled 2023-04-08: qty 250

## 2023-04-08 MED ORDER — IOHEXOL 350 MG/ML SOLN
75.0000 mL | Freq: Once | INTRAVENOUS | Status: AC | PRN
  Administered 2023-04-08: 75 mL via INTRAVENOUS

## 2023-04-08 MED ORDER — SODIUM CHLORIDE 0.9% IV SOLUTION: Freq: Once | INTRAVENOUS | Status: DC

## 2023-04-08 MED ORDER — LIDOCAINE VISCOUS HCL 2 % MT SOLN
15.0000 mL | Freq: Once | OROMUCOSAL | Status: AC
  Administered 2023-04-08: 15 mL via ORAL
  Filled 2023-04-08: qty 15

## 2023-04-08 MED ORDER — LACTATED RINGERS IV BOLUS
1000.0000 mL | Freq: Once | INTRAVENOUS | Status: AC
  Administered 2023-04-08: 1000 mL via INTRAVENOUS

## 2023-04-08 MED ORDER — PANTOPRAZOLE SODIUM 40 MG IV SOLR
40.0000 mg | Freq: Once | INTRAVENOUS | Status: AC
  Administered 2023-04-08: 40 mg via INTRAVENOUS
  Filled 2023-04-08: qty 10

## 2023-04-08 NOTE — ED Notes (Signed)
Wounds redressed with wet to dry dressing.

## 2023-04-08 NOTE — ED Notes (Addendum)
CN Jamie and EDP notified of elevated lactic

## 2023-04-08 NOTE — Progress Notes (Signed)
ED Pharmacy Antibiotic Sign Off An antibiotic consult was received from an ED provider for vancomycin per pharmacy dosing for  wound infection . A chart review was completed to assess appropriateness.  A single dose of zosyn 3.375g was placed by the ED provider.   The following one time order(s) were placed per pharmacy consult:  vancomycin 1500 mg x 1 dose  Further antibiotic and/or antibiotic pharmacy consults should be ordered by the admitting provider if indicated.   Thank you for allowing pharmacy to be a part of this patient's care.   Delmar Landau, PharmD, BCPS 04/08/2023 8:09 PM ED Clinical Pharmacist -  [phone]

## 2023-04-08 NOTE — ED Provider Notes (Signed)
[place] EMERGENCY DEPARTMENT AT [facility] Provider Note   CSN: [id number] Arrival date & time: 04/08/23  1721     History  Chief Complaint  Patient presents with   Abdominal Pain    Steve [name] is a 32 y.o. male, history of quadriplegia, pelvic osteomyelitis who presents to the ED secondary to abdominal pain for 3 to 4 days.  Also states that there is drainage from his wound, but denies any fevers.  Heart rate has been elevated as well.  Denies any chest pain, shortness of breath.  Has had purulent drainage from his wounds.     Home Medications Prior to Admission medications   Medication Sig Start Date End Date Taking? Authorizing Provider  acetaminophen (TYLENOL) 325 MG tablet Take 650 mg by mouth 2 (two) times daily as needed for mild pain. 03/16/22   [provider]  ARIPiprazole (ABILIFY) 30 MG tablet Take 15 mg by mouth daily. 03/16/22   [provider]  Calcium Carbonate 500 MG CHEW Chew 500 mg by mouth 2 (two) times daily as needed (gerd). 03/16/22   [provider]  midodrine (PROAMATINE) 5 MG tablet Take 5 mg by mouth 2 (two) times daily as needed (dizziness when sitting up). 03/16/22   [provider]  omeprazole (PRILOSEC) 40 MG capsule Take 40 mg by mouth daily.    [provider]  pregabalin (LYRICA) 150 MG capsule Take 150 mg by mouth 2 (two) times daily.    [provider]  sertraline (ZOLOFT) 50 MG tablet Take 25 mg by mouth daily. 03/16/22   [provider]  tiZANidine (ZANAFLEX) 4 MG tablet Take 4 mg by mouth at bedtime as needed for muscle spasms. 03/16/22   [provider]  traZODone (DESYREL) 50 MG tablet Take 50 mg by mouth at bedtime. 03/16/22   [provider]  trospium (SANCTURA) 20 MG tablet Take 20 mg by mouth 2 (two) times daily.    [provider]  prazosin (MINIPRESS) 2 MG capsule Take 1 capsule (2 mg total) by mouth at bedtime. Patient not taking:  Reported on 09/03/2017 11/04/16 09/14/19  Kristeen Mans, NP      Allergies    Caffeine; Chocolate; Tuberculin, ppd; and Sulfa antibiotics    Review of Systems   Review of Systems  Respiratory:  Negative for shortness of breath.   Cardiovascular:  Negative for chest pain.  Gastrointestinal:  Positive for abdominal pain.    Physical Exam Updated Vital Signs BP 108/63   Pulse 99   Temp 98 F (36.7 C)   Resp 15   SpO2 100%  Physical Exam Vitals and nursing note reviewed.  Constitutional:      General: He is not in acute distress.    Appearance: He is well-developed.  HENT:     Head: Normocephalic and atraumatic.  Eyes:     Conjunctiva/sclera: Conjunctivae normal.  Cardiovascular:     Rate and Rhythm: Normal rate and regular rhythm.     Heart sounds: No murmur heard. Pulmonary:     Effort: Pulmonary effort is normal. No respiratory distress.     Breath sounds: Normal breath sounds.  Abdominal:     Palpations: Abdomen is soft.     Tenderness: There is generalized abdominal tenderness. There is no guarding.  Genitourinary:    Rectum: Normal. Guaiac result negative. No tenderness.  Musculoskeletal:        General: No swelling.     Cervical back: Neck supple.  Skin:    General: Skin is warm.     Capillary Refill: Capillary refill takes less than 2 seconds.     Comments: Large wounds to the buttocks, see pictures below.  Purulent drainage.  Neurological:     Mental Status: He is alert.  Psychiatric:        Mood and Affect: Mood normal.        ED Results / Procedures / Treatments   Labs (all labs ordered are listed, but only abnormal results are displayed) Labs Reviewed  COMPREHENSIVE METABOLIC PANEL - Abnormal; Notable for the following components:      Result Value   Sodium 129 (*)    Chloride 97 (*)    CO2 20 (*)    Glucose, Bld 100 (*)    Calcium 7.6 (*)    Albumin <1.5 (*)    All other components within normal limits  PROTIME-INR - Abnormal; Notable  for the following components:   Prothrombin Time 16.6 (*)    INR 1.3 (*)    All other components within normal limits  LACTIC ACID, PLASMA - Abnormal; Notable for the following components:   Lactic Acid, Venous 2.4 (*)    All other components within normal limits  CBC WITH DIFFERENTIAL/PLATELET - Abnormal; Notable for the following components:   WBC 10.6 (*)    RBC 2.39 (*)    Hemoglobin 6.3 (*)    HCT 20.8 (*)    RDW 18.6 (*)    Neutro Abs 9.3 (*)    Abs Immature Granulocytes 0.08 (*)    All other components within normal limits  I-STAT CG4 LACTIC ACID, ED - Abnormal; Notable for the following components:   Lactic Acid, Venous 2.8 (*)    All other components within normal limits  CULTURE, BLOOD (ROUTINE X 2)  CULTURE, BLOOD (ROUTINE X 2)  CBC WITH DIFFERENTIAL/PLATELET  URINALYSIS, W/ REFLEX TO CULTURE (INFECTION SUSPECTED)  LACTIC ACID, PLASMA  OCCULT BLOOD X 1 CARD TO LAB, STOOL  POC OCCULT BLOOD, ED  I-STAT CG4 LACTIC ACID, ED  TYPE AND SCREEN  PREPARE RBC (CROSSMATCH)    EKG None  Radiology No results found.  Procedures Procedures    Medications Ordered in ED Medications  vancomycin (VANCOREADY) IVPB 1500 mg/300 mL (1,500 mg Intravenous New Bag/Given 04/08/23 2045)  0.9 %  sodium chloride infusion (Manually program via Guardrails IV Fluids) (has no administration in time range)  pantoprazole (PROTONIX) injection 40 mg (has no administration in time range)  alum & mag hydroxide-simeth (MAALOX/MYLANTA) 200-200-20 MG/5ML suspension 30 mL (has no administration in time range)    And  lidocaine (XYLOCAINE) 2 % viscous mouth solution 15 mL (has no administration in time range)  piperacillin-tazobactam (ZOSYN) IVPB 3.375 g (0 g Intravenous Stopped 04/08/23 2044)  sodium chloride 0.9 % bolus 1,000 mL (0 mLs Intravenous Stopped 04/08/23 2145)  iohexol (OMNIPAQUE) 350 MG/ML injection 75 mL (75 mLs Intravenous Contrast Given 04/08/23 2158)    ED Course/ Medical Decision  Making/ A&P                             Medical Decision Making Patient is a 32 year old male, history of paraplegia plegia, osteomyelitis of the pelvis here for purulent drainage from his wounds, as well as some abdominal pain.  Abdominal pain is epigastric, no nausea, vomiting.  Reduced appetite.  Wounds are large and foul-smelling, with discharge, we will obtain lactic acid blood cultures, start him  on IV antibiotics.  He has a history of MRSA thus we will start him on vancomycin and Zosyn.    Amount and/or Complexity of Data Reviewed Labs: ordered.    Details: Lactic acid of 2.4, hemoglobin 6.3 Radiology: ordered. Discussion of management or test interpretation with external provider(s): Discussed w/Dr. Posey Rea, I believe the patient has a likely wound infection given purulent drainage, elevated lactic acid. Started on IV antibiotics. Ordered CT for further evaluation. Additionally, has hemoglobin 6.3 we will start him on 1 unit of blood, I am suspicious that he may have chronic anemia possibly from a bleeding ulcer likely from epigastric pain.  Pending CT signed out to Dr. Posey Rea. Plan to admit inpatient given anemia, and wound infection.   Risk OTC drugs. Prescription drug management.   Final Clinical Impression(s) / ED Diagnoses Final diagnoses:  None    Rx / DC Orders ED Discharge Orders     None         Dempsey Knotek, [place]ley [place], PA 04/08/23 2214    Glendora Score, MD 04/09/23 1502

## 2023-04-08 NOTE — ED Triage Notes (Signed)
Pt reports abd pain but denies n/v. Pt also reports recently treated for sacral wounds with surgery. pt is wc bound. Pt reports drainage from sacral wounds and puss in his urine. Denies fever.pt tachycardic in triage

## 2023-04-08 NOTE — ED Notes (Signed)
MD Kommor made aware of BP. MD to bedside to assess pt.

## 2023-04-09 ENCOUNTER — Inpatient Hospital Stay (HOSPITAL_COMMUNITY): Payer: No Typology Code available for payment source

## 2023-04-09 DIAGNOSIS — M009 Pyogenic arthritis, unspecified: Secondary | ICD-10-CM | POA: Diagnosis present

## 2023-04-09 DIAGNOSIS — R578 Other shock: Secondary | ICD-10-CM

## 2023-04-09 DIAGNOSIS — A419 Sepsis, unspecified organism: Secondary | ICD-10-CM | POA: Diagnosis not present

## 2023-04-09 DIAGNOSIS — L02416 Cutaneous abscess of left lower limb: Secondary | ICD-10-CM | POA: Diagnosis present

## 2023-04-09 DIAGNOSIS — L8990 Pressure ulcer of unspecified site, unspecified stage: Secondary | ICD-10-CM | POA: Insufficient documentation

## 2023-04-09 DIAGNOSIS — R6521 Severe sepsis with septic shock: Secondary | ICD-10-CM | POA: Diagnosis not present

## 2023-04-09 DIAGNOSIS — N390 Urinary tract infection, site not specified: Secondary | ICD-10-CM | POA: Diagnosis present

## 2023-04-09 DIAGNOSIS — L89150 Pressure ulcer of sacral region, unstageable: Secondary | ICD-10-CM | POA: Diagnosis not present

## 2023-04-09 DIAGNOSIS — F200 Paranoid schizophrenia: Secondary | ICD-10-CM | POA: Diagnosis present

## 2023-04-09 DIAGNOSIS — B9689 Other specified bacterial agents as the cause of diseases classified elsewhere: Secondary | ICD-10-CM | POA: Diagnosis not present

## 2023-04-09 DIAGNOSIS — A4102 Sepsis due to Methicillin resistant Staphylococcus aureus: Secondary | ICD-10-CM | POA: Diagnosis present

## 2023-04-09 DIAGNOSIS — F418 Other specified anxiety disorders: Secondary | ICD-10-CM | POA: Diagnosis not present

## 2023-04-09 DIAGNOSIS — M86651 Other chronic osteomyelitis, right thigh: Secondary | ICD-10-CM | POA: Diagnosis not present

## 2023-04-09 DIAGNOSIS — F17210 Nicotine dependence, cigarettes, uncomplicated: Secondary | ICD-10-CM | POA: Diagnosis present

## 2023-04-09 DIAGNOSIS — L89159 Pressure ulcer of sacral region, unspecified stage: Secondary | ICD-10-CM | POA: Diagnosis not present

## 2023-04-09 DIAGNOSIS — M7989 Other specified soft tissue disorders: Secondary | ICD-10-CM | POA: Diagnosis not present

## 2023-04-09 DIAGNOSIS — E872 Acidosis, unspecified: Secondary | ICD-10-CM | POA: Diagnosis present

## 2023-04-09 DIAGNOSIS — R532 Functional quadriplegia: Secondary | ICD-10-CM | POA: Diagnosis present

## 2023-04-09 DIAGNOSIS — A4159 Other Gram-negative sepsis: Secondary | ICD-10-CM | POA: Diagnosis present

## 2023-04-09 DIAGNOSIS — M4628 Osteomyelitis of vertebra, sacral and sacrococcygeal region: Secondary | ICD-10-CM | POA: Diagnosis present

## 2023-04-09 DIAGNOSIS — R7881 Bacteremia: Secondary | ICD-10-CM | POA: Diagnosis not present

## 2023-04-09 DIAGNOSIS — F1510 Other stimulant abuse, uncomplicated: Secondary | ICD-10-CM | POA: Diagnosis not present

## 2023-04-09 DIAGNOSIS — L02415 Cutaneous abscess of right lower limb: Secondary | ICD-10-CM | POA: Diagnosis not present

## 2023-04-09 DIAGNOSIS — R64 Cachexia: Secondary | ICD-10-CM | POA: Diagnosis present

## 2023-04-09 DIAGNOSIS — L89520 Pressure ulcer of left ankle, unstageable: Secondary | ICD-10-CM | POA: Diagnosis present

## 2023-04-09 DIAGNOSIS — L89324 Pressure ulcer of left buttock, stage 4: Secondary | ICD-10-CM | POA: Diagnosis present

## 2023-04-09 DIAGNOSIS — M8648 Chronic osteomyelitis with draining sinus, other site: Secondary | ICD-10-CM | POA: Diagnosis not present

## 2023-04-09 DIAGNOSIS — K651 Peritoneal abscess: Secondary | ICD-10-CM | POA: Diagnosis not present

## 2023-04-09 DIAGNOSIS — L89214 Pressure ulcer of right hip, stage 4: Secondary | ICD-10-CM | POA: Diagnosis present

## 2023-04-09 DIAGNOSIS — D649 Anemia, unspecified: Secondary | ICD-10-CM | POA: Diagnosis present

## 2023-04-09 DIAGNOSIS — E871 Hypo-osmolality and hyponatremia: Secondary | ICD-10-CM | POA: Diagnosis present

## 2023-04-09 DIAGNOSIS — L89314 Pressure ulcer of right buttock, stage 4: Secondary | ICD-10-CM | POA: Diagnosis present

## 2023-04-09 DIAGNOSIS — L89022 Pressure ulcer of left elbow, stage 2: Secondary | ICD-10-CM | POA: Diagnosis present

## 2023-04-09 DIAGNOSIS — M0009 Staphylococcal polyarthritis: Secondary | ICD-10-CM | POA: Diagnosis not present

## 2023-04-09 DIAGNOSIS — R8271 Bacteriuria: Secondary | ICD-10-CM | POA: Diagnosis not present

## 2023-04-09 DIAGNOSIS — B9562 Methicillin resistant Staphylococcus aureus infection as the cause of diseases classified elsewhere: Secondary | ICD-10-CM | POA: Diagnosis not present

## 2023-04-09 DIAGNOSIS — E876 Hypokalemia: Secondary | ICD-10-CM | POA: Diagnosis present

## 2023-04-09 DIAGNOSIS — T148XXA Other injury of unspecified body region, initial encounter: Secondary | ICD-10-CM | POA: Diagnosis not present

## 2023-04-09 DIAGNOSIS — L089 Local infection of the skin and subcutaneous tissue, unspecified: Secondary | ICD-10-CM | POA: Diagnosis not present

## 2023-04-09 DIAGNOSIS — L89892 Pressure ulcer of other site, stage 2: Secondary | ICD-10-CM | POA: Diagnosis present

## 2023-04-09 DIAGNOSIS — D638 Anemia in other chronic diseases classified elsewhere: Secondary | ICD-10-CM | POA: Diagnosis present

## 2023-04-09 LAB — URINALYSIS, W/ REFLEX TO CULTURE (INFECTION SUSPECTED)
Bilirubin Urine: NEGATIVE
Glucose, UA: NEGATIVE mg/dL
Hgb urine dipstick: NEGATIVE
Ketones, ur: NEGATIVE mg/dL
Nitrite: NEGATIVE
Protein, ur: 100 mg/dL — AB
Specific Gravity, Urine: 1.041 — ABNORMAL HIGH (ref 1.005–1.030)
WBC, UA: >50 WBC/hpf (ref 0–5)
pH: 8 (ref 5.0–8.0)

## 2023-04-09 LAB — BLOOD CULTURE ID PANEL (REFLEXED) - BCID2
A.calcoaceticus-baumannii: NOT DETECTED
A.calcoaceticus-baumannii: NOT DETECTED
Bacteroides fragilis: NOT DETECTED
Bacteroides fragilis: NOT DETECTED
Candida albicans: NOT DETECTED
Candida albicans: NOT DETECTED
Candida auris: NOT DETECTED
Candida auris: NOT DETECTED
Candida glabrata: NOT DETECTED
Candida glabrata: NOT DETECTED
Candida krusei: NOT DETECTED
Candida krusei: NOT DETECTED
Candida parapsilosis: NOT DETECTED
Candida parapsilosis: NOT DETECTED
Candida tropicalis: NOT DETECTED
Candida tropicalis: NOT DETECTED
Cryptococcus neoformans/gattii: NOT DETECTED
Cryptococcus neoformans/gattii: NOT DETECTED
Enterobacter cloacae complex: NOT DETECTED
Enterobacter cloacae complex: NOT DETECTED
Enterobacterales: NOT DETECTED
Enterobacterales: NOT DETECTED
Enterococcus Faecium: NOT DETECTED
Enterococcus Faecium: NOT DETECTED
Enterococcus faecalis: NOT DETECTED
Enterococcus faecalis: NOT DETECTED
Escherichia coli: NOT DETECTED
Escherichia coli: NOT DETECTED
Haemophilus influenzae: NOT DETECTED
Haemophilus influenzae: NOT DETECTED
Klebsiella aerogenes: NOT DETECTED
Klebsiella aerogenes: NOT DETECTED
Klebsiella oxytoca: NOT DETECTED
Klebsiella oxytoca: NOT DETECTED
Klebsiella pneumoniae: NOT DETECTED
Klebsiella pneumoniae: NOT DETECTED
Listeria monocytogenes: NOT DETECTED
Listeria monocytogenes: NOT DETECTED
Meth resistant mecA/C and MREJ: DETECTED — AB
Neisseria meningitidis: NOT DETECTED
Neisseria meningitidis: NOT DETECTED
Proteus species: NOT DETECTED
Proteus species: NOT DETECTED
Pseudomonas aeruginosa: NOT DETECTED
Pseudomonas aeruginosa: NOT DETECTED
Salmonella species: NOT DETECTED
Salmonella species: NOT DETECTED
Serratia marcescens: NOT DETECTED
Serratia marcescens: NOT DETECTED
Staphylococcus aureus (BCID): NOT DETECTED
Staphylococcus aureus (BCID): DETECTED — AB
Staphylococcus epidermidis: NOT DETECTED
Staphylococcus epidermidis: NOT DETECTED
Staphylococcus lugdunensis: NOT DETECTED
Staphylococcus lugdunensis: NOT DETECTED
Staphylococcus species: NOT DETECTED
Staphylococcus species: DETECTED — AB
Stenotrophomonas maltophilia: NOT DETECTED
Stenotrophomonas maltophilia: NOT DETECTED
Streptococcus agalactiae: NOT DETECTED
Streptococcus agalactiae: NOT DETECTED
Streptococcus pneumoniae: NOT DETECTED
Streptococcus pneumoniae: NOT DETECTED
Streptococcus pyogenes: NOT DETECTED
Streptococcus pyogenes: NOT DETECTED
Streptococcus species: DETECTED — AB
Streptococcus species: DETECTED — AB

## 2023-04-09 LAB — RAPID URINE DRUG SCREEN, HOSP PERFORMED
Amphetamines: NOT DETECTED
Barbiturates: NOT DETECTED
Benzodiazepines: NOT DETECTED
Cocaine: NOT DETECTED
Opiates: POSITIVE — AB
Tetrahydrocannabinol: NOT DETECTED

## 2023-04-09 LAB — CBC
HCT: 21.4 % — ABNORMAL LOW (ref 39.0–52.0)
Hemoglobin: 6.6 g/dL — CL (ref 13.0–17.0)
MCH: 27 pg (ref 26.0–34.0)
MCHC: 30.8 g/dL (ref 30.0–36.0)
MCV: 87.7 fL (ref 80.0–100.0)
Platelets: 307 10*3/uL (ref 150–400)
RBC: 2.44 MIL/uL — ABNORMAL LOW (ref 4.22–5.81)
RDW: 17.9 % — ABNORMAL HIGH (ref 11.5–15.5)
WBC: 8.2 10*3/uL (ref 4.0–10.5)
nRBC: 0 % (ref 0.0–0.2)

## 2023-04-09 LAB — PHOSPHORUS: Phosphorus: 3.3 mg/dL (ref 2.5–4.6)

## 2023-04-09 LAB — BPAM RBC
Blood Product Expiration Date: 202408162359
Blood Product Expiration Date: 202408182359
ISSUE DATE / TIME: 202407222324
ISSUE DATE / TIME: 202407231133
ISSUE DATE / TIME: 202407232059
Unit Type and Rh: 6200

## 2023-04-09 LAB — BASIC METABOLIC PANEL
Anion gap: 10 (ref 5–15)
BUN: 8 mg/dL (ref 6–20)
CO2: 18 mmol/L — ABNORMAL LOW (ref 22–32)
Calcium: 7.1 mg/dL — ABNORMAL LOW (ref 8.9–10.3)
Chloride: 98 mmol/L (ref 98–111)
Creatinine, Ser: 0.71 mg/dL (ref 0.61–1.24)
GFR, Estimated: >60 mL/min (ref >60)
Glucose, Bld: 123 mg/dL — ABNORMAL HIGH (ref 70–99)
Potassium: 3.2 mmol/L — ABNORMAL LOW (ref 3.5–5.1)
Sodium: 126 mmol/L — ABNORMAL LOW (ref 135–145)

## 2023-04-09 LAB — PREPARE RBC (CROSSMATCH)

## 2023-04-09 LAB — TYPE AND SCREEN: ABO/RH(D): A POS

## 2023-04-09 LAB — ECHOCARDIOGRAM COMPLETE
AR max vel: 4.05 cm2
AV Peak grad: 7.2 mmHg
Ao pk vel: 1.34 m/s
Area-P 1/2: 3.31 cm2
Height: 71 in
S' Lateral: 2.9 cm
Weight: 2560 oz

## 2023-04-09 LAB — CULTURE, BLOOD (ROUTINE X 2): Culture: NO GROWTH

## 2023-04-09 LAB — NA AND K (SODIUM & POTASSIUM), RAND UR
Potassium Urine: 26 mmol/L
Sodium, Ur: 42 mmol/L

## 2023-04-09 LAB — HEMOGLOBIN AND HEMATOCRIT, BLOOD
HCT: 21 % — ABNORMAL LOW (ref 39.0–52.0)
Hemoglobin: 6.8 g/dL — CL (ref 13.0–17.0)

## 2023-04-09 LAB — GLUCOSE, CAPILLARY
Glucose-Capillary: 122 mg/dL — ABNORMAL HIGH (ref 70–99)
Glucose-Capillary: 110 mg/dL — ABNORMAL HIGH (ref 70–99)
Glucose-Capillary: 132 mg/dL — ABNORMAL HIGH (ref 70–99)
Glucose-Capillary: 129 mg/dL — ABNORMAL HIGH (ref 70–99)
Glucose-Capillary: 114 mg/dL — ABNORMAL HIGH (ref 70–99)
Glucose-Capillary: 109 mg/dL — ABNORMAL HIGH (ref 70–99)

## 2023-04-09 LAB — CHLORIDE, URINE, RANDOM: Chloride Urine: 51 mmol/L

## 2023-04-09 LAB — SEDIMENTATION RATE: Sed Rate: >140 mm/hr — ABNORMAL HIGH (ref 0–16)

## 2023-04-09 LAB — MAGNESIUM: Magnesium: 1.4 mg/dL — ABNORMAL LOW (ref 1.7–2.4)

## 2023-04-09 LAB — OSMOLALITY, URINE: Osmolality, Ur: 407 mOsm/kg (ref 300–900)

## 2023-04-09 LAB — C-REACTIVE PROTEIN: CRP: 13.8 mg/dL — ABNORMAL HIGH (ref <1.0)

## 2023-04-09 MED ORDER — LACTATED RINGERS IV BOLUS
1000.0000 mL | Freq: Once | INTRAVENOUS | Status: AC
  Administered 2023-04-09: 1000 mL via INTRAVENOUS

## 2023-04-09 MED ORDER — POTASSIUM CHLORIDE CRYS ER 20 MEQ PO TBCR
40.0000 meq | EXTENDED_RELEASE_TABLET | Freq: Once | ORAL | Status: AC
  Administered 2023-04-09: 40 meq via ORAL
  Filled 2023-04-09: qty 2

## 2023-04-09 MED ORDER — OLANZAPINE 10 MG IM SOLR
5.0000 mg | Freq: Once | INTRAMUSCULAR | Status: AC
  Administered 2023-04-09: 5 mg via INTRAVENOUS
  Filled 2023-04-09: qty 10

## 2023-04-09 MED ORDER — POTASSIUM CHLORIDE CRYS ER 20 MEQ PO TBCR
40.0000 meq | EXTENDED_RELEASE_TABLET | ORAL | Status: AC
  Administered 2023-04-09 (×2): 40 meq via ORAL
  Filled 2023-04-09 (×2): qty 2

## 2023-04-09 MED ORDER — VANCOMYCIN HCL IN DEXTROSE 1-5 GM/200ML-% IV SOLN
1000.0000 mg | Freq: Three times a day (TID) | INTRAVENOUS | Status: DC
  Administered 2023-04-09: 1000 mg via INTRAVENOUS
  Filled 2023-04-09 (×2): qty 200

## 2023-04-09 MED ORDER — VANCOMYCIN HCL IN DEXTROSE 1-5 GM/200ML-% IV SOLN
1000.0000 mg | Freq: Two times a day (BID) | INTRAVENOUS | Status: DC
  Administered 2023-04-09 – 2023-04-11 (×4): 1000 mg via INTRAVENOUS
  Filled 2023-04-09 (×5): qty 200

## 2023-04-09 MED ORDER — PIPERACILLIN-TAZOBACTAM 3.375 G IVPB
3.3750 g | Freq: Three times a day (TID) | INTRAVENOUS | Status: DC
  Administered 2023-04-09 – 2023-04-10 (×4): 3.375 g via INTRAVENOUS
  Filled 2023-04-09 (×5): qty 50

## 2023-04-09 MED ORDER — SODIUM CHLORIDE 0.9% IV SOLUTION: Freq: Once | INTRAVENOUS | Status: AC

## 2023-04-09 MED ORDER — MIDODRINE HCL 5 MG PO TABS
10.0000 mg | ORAL_TABLET | Freq: Three times a day (TID) | ORAL | Status: DC
  Administered 2023-04-09: 10 mg via ORAL
  Filled 2023-04-09 (×2): qty 2

## 2023-04-09 MED ORDER — MAGNESIUM SULFATE 4 GM/100ML IV SOLN
4.0000 g | Freq: Once | INTRAVENOUS | Status: AC
  Administered 2023-04-09: 4 g via INTRAVENOUS
  Filled 2023-04-09: qty 100

## 2023-04-09 MED ORDER — STERILE WATER FOR INJECTION IJ SOLN
INTRAMUSCULAR | Status: AC
  Administered 2023-04-09: 10 mL
  Filled 2023-04-09: qty 10

## 2023-04-09 MED ORDER — DOCUSATE SODIUM 100 MG PO CAPS: 100.0000 mg | ORAL_CAPSULE | Freq: Two times a day (BID) | ORAL | Status: DC | PRN

## 2023-04-09 MED ORDER — MEDIHONEY WOUND/BURN DRESSING EX PSTE
1.0000 | PASTE | Freq: Every day | CUTANEOUS | Status: DC
  Administered 2023-04-09 – 2023-05-03 (×21): 1 via TOPICAL
  Filled 2023-04-09 (×4): qty 44

## 2023-04-09 MED ORDER — IBUPROFEN 200 MG PO TABS: 600.0000 mg | ORAL_TABLET | Freq: Once | ORAL | Status: DC

## 2023-04-09 MED ORDER — ORAL CARE MOUTH RINSE: 15.0000 mL | OROMUCOSAL | Status: DC | PRN

## 2023-04-09 MED ORDER — SODIUM CHLORIDE 0.9% IV SOLUTION
Freq: Once | INTRAVENOUS | Status: AC
  Administered 2023-04-09: 10 mL/h via INTRAVENOUS

## 2023-04-09 MED ORDER — BACLOFEN 10 MG PO TABS
10.0000 mg | ORAL_TABLET | Freq: Three times a day (TID) | ORAL | Status: DC | PRN
  Administered 2023-04-09 – 2023-04-21 (×10): 10 mg via ORAL
  Filled 2023-04-09 (×11): qty 1

## 2023-04-09 MED ORDER — OXYCODONE HCL 5 MG PO TABS
5.0000 mg | ORAL_TABLET | Freq: Four times a day (QID) | ORAL | Status: DC | PRN
  Administered 2023-04-09 – 2023-05-02 (×48): 5 mg via ORAL
  Filled 2023-04-09 (×53): qty 1

## 2023-04-09 MED ORDER — LACTATED RINGERS IV SOLN
INTRAVENOUS | Status: DC
  Administered 2023-04-09: 125 mL/h via INTRAVENOUS

## 2023-04-09 MED ORDER — SODIUM CHLORIDE 0.9 % IV SOLN: INTRAVENOUS | Status: DC

## 2023-04-09 MED ORDER — BACLOFEN 10 MG PO TABS
10.0000 mg | ORAL_TABLET | Freq: Three times a day (TID) | ORAL | Status: DC
  Administered 2023-04-09: 10 mg via ORAL
  Filled 2023-04-09 (×2): qty 1

## 2023-04-09 MED ORDER — PANTOPRAZOLE SODIUM 40 MG IV SOLR
40.0000 mg | Freq: Two times a day (BID) | INTRAVENOUS | Status: DC
  Administered 2023-04-09 – 2023-04-11 (×6): 40 mg via INTRAVENOUS
  Filled 2023-04-09 (×6): qty 10

## 2023-04-09 MED ORDER — ALUM & MAG HYDROXIDE-SIMETH 200-200-20 MG/5ML PO SUSP
30.0000 mL | Freq: Four times a day (QID) | ORAL | Status: DC | PRN
  Administered 2023-04-09 – 2023-04-13 (×3): 30 mL via ORAL
  Filled 2023-04-09 (×4): qty 30

## 2023-04-09 MED ORDER — ACETAMINOPHEN 325 MG PO TABS
650.0000 mg | ORAL_TABLET | Freq: Once | ORAL | Status: AC
  Administered 2023-04-09: 650 mg via ORAL
  Filled 2023-04-09: qty 2

## 2023-04-09 MED ORDER — POLYETHYLENE GLYCOL 3350 17 G PO PACK: 17.0000 g | PACK | Freq: Every day | ORAL | Status: DC | PRN

## 2023-04-09 MED ORDER — CHLORHEXIDINE GLUCONATE CLOTH 2 % EX PADS
6.0000 | MEDICATED_PAD | Freq: Every day | CUTANEOUS | Status: DC
  Administered 2023-04-09 – 2023-05-03 (×21): 6 via TOPICAL

## 2023-04-09 MED ORDER — MIDODRINE HCL 5 MG PO TABS
10.0000 mg | ORAL_TABLET | Freq: Three times a day (TID) | ORAL | Status: DC
  Administered 2023-04-09 – 2023-04-27 (×53): 10 mg via ORAL
  Filled 2023-04-09 (×52): qty 2

## 2023-04-09 NOTE — Progress Notes (Addendum)
PHARMACY - PHYSICIAN COMMUNICATION CRITICAL VALUE ALERT - BLOOD CULTURE IDENTIFICATION (BCID)  Steve [name] is an 32 y.o. male who presented to [facility] on 04/08/2023 with a chief complaint of weakness and malaise.   Assessment: 32 year old male admitted with purulent drainage from bilateral pressure wounds. Found to have septic arthritis and osteomyelitis of the left hip. Now with strep species in 1/3 blood cultures. Could be contaminant vs. True pathogen from hip wound.   Name of physician (or Provider) Contacted: CCM team/PharmD   Current antibiotics: vanc/zosyn   Changes to prescribed antibiotics recommended:  None based on this culture- continue  treatment for septic arthritis/wounds    Update 11:45 AM- Now also with MRSA in 1/4 blood cultures. ID consulting   Results for orders placed or performed during the hospital encounter of 04/08/23  Blood Culture ID Panel (Reflexed) (Collected: 04/08/2023  8:08 PM)  Result Value Ref Range   Enterococcus faecalis NOT DETECTED NOT DETECTED   Enterococcus Faecium NOT DETECTED NOT DETECTED   Listeria monocytogenes NOT DETECTED NOT DETECTED   Staphylococcus species NOT DETECTED NOT DETECTED   Staphylococcus aureus (BCID) NOT DETECTED NOT DETECTED   Staphylococcus epidermidis NOT DETECTED NOT DETECTED   Staphylococcus lugdunensis NOT DETECTED NOT DETECTED   Streptococcus species DETECTED (A) NOT DETECTED   Streptococcus agalactiae NOT DETECTED NOT DETECTED   Streptococcus pneumoniae NOT DETECTED NOT DETECTED   Streptococcus pyogenes NOT DETECTED NOT DETECTED   A.calcoaceticus-baumannii NOT DETECTED NOT DETECTED   Bacteroides fragilis NOT DETECTED NOT DETECTED   Enterobacterales NOT DETECTED NOT DETECTED   Enterobacter cloacae complex NOT DETECTED NOT DETECTED   Escherichia coli NOT DETECTED NOT DETECTED   Klebsiella aerogenes NOT DETECTED NOT DETECTED   Klebsiella oxytoca NOT DETECTED NOT DETECTED   Klebsiella pneumoniae NOT DETECTED  NOT DETECTED   Proteus species NOT DETECTED NOT DETECTED   Salmonella species NOT DETECTED NOT DETECTED   Serratia marcescens NOT DETECTED NOT DETECTED   Haemophilus influenzae NOT DETECTED NOT DETECTED   Neisseria meningitidis NOT DETECTED NOT DETECTED   Pseudomonas aeruginosa NOT DETECTED NOT DETECTED   Stenotrophomonas maltophilia NOT DETECTED NOT DETECTED   Candida albicans NOT DETECTED NOT DETECTED   Candida auris NOT DETECTED NOT DETECTED   Candida glabrata NOT DETECTED NOT DETECTED   Candida krusei NOT DETECTED NOT DETECTED   Candida parapsilosis NOT DETECTED NOT DETECTED   Candida tropicalis NOT DETECTED NOT DETECTED   Cryptococcus neoformans/gattii NOT DETECTED NOT DETECTED    Sharin Mons, PharmD, BCPS, BCIDP Infectious Diseases Clinical Pharmacist Phone: [phone] 04/09/2023  9:42 AM

## 2023-04-09 NOTE — H&P (Signed)
NAME:  Steve [name], MRN:  [id number], DOB:  23-Mar-1991, LOS: 0 ADMISSION DATE:  04/08/2023, CONSULTATION DATE:  7/23 REFERRING MD:  Dr. Posey Rea, CHIEF COMPLAINT:  Shock   History of Present Illness:  32 year old male with past medical history as below which is significant for paraplegia, substance abuse issues, psychostimulant induced psychosis, neurogenic bladder, chronic hypotension on midodrine, chronic osteomyelitis, paranoid schizophrenia, and PTSD.  He receives most of his medical care through the [place] where he was recently admitted for infections of bilateral ischial wounds.  He is currently a poor historian.  He tells me since his discharge from the [place] he has been quite lazy and not really doing much to care for himself.  He has not been eating and drinking well the past few days.  He has used methamphetamine as recently as 1 week prior to presentation.  He lives at home with his wife and uses a wheelchair to get around but has not really gotten out of bed much in the past couple weeks.  He also reports he has not been taking any of his medications because he does not think to do anything.    He presented to [facility] emergency department on 7/22 with complaints of weakness and malaise.  Upon arrival arrival to the emergency department he was noted to have purulent drainage from his bilateral pressure wounds as well as abdominal pain.  He was started on IV antibiotics for presumed cellulitis versus osteomyelitis surrounding the wounds.  CT of the abdomen and pelvis showed findings worrisome for septic arthritis and osteomyelitis of the left hip.  Chronic posterior left hip dislocation.  Small amount of air also noted in the bladder.  He is planning to be admitted to the hospitalist service, however, he then became hypotensive refractory to volume resuscitation and was started on norepinephrine infusion.  PCCM was asked to admit to ICU.  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   Significant Hospital Events: Including procedures, antibiotic start and stop dates in addition to other pertinent events     Interim History / Subjective:    Objective   Blood pressure (!) 108/41, pulse (!) 115, temperature 99.6 F (37.6 C), temperature source Oral, resp. rate 20, SpO2 100%.       No intake or output data in the 24 hours ending 04/09/23 0019 There were no vitals filed for this visit.  Examination: General: Thin adult male in no acute distress HENT: Normocephalic, atraumatic, PERRL Lungs: Clear bilateral breath sounds Cardiovascular: Regular rate and rhythm Abdomen: Soft, nontender, nondistended Extremities: Bilateral deep buttock wounds with purulent drainage Neuro: Alert, oriented, nonfocal     Resolved Hospital Problem list     Assessment & Plan:   Septic shock secondary to septic arthritis and osteomyelitis of the left hip -Admit to ICU -Empiric Zosyn and vancomycin.  He has history of MRSA bacteremia -Cultures pending -Norepinephrine infusion for MAP goal 65 -Restart home midodrine which he has presumably not been taking -1 unit PRBC transfusing after hemoglobin 6.3 -Hopefully he can avoid escalating pressor doses and central access as I fear he may be bacteremic. -Wound care consult -ER physician has spoken with orthopedics who did not feel as though there is a surgical indication at this time.  Anemia: no clear blood  loss. C/o epigastric pain. Ulcer? - PPI BID - Hemoccult  Paraplegia Neurogenic bladder -Bladder scan every shift -Straight cath as needed  Schizophrenia Substance abuse Last used meth one week ago - UDS - Has been off all his home meds.    Best Practice (right click and "Reselect all SmartList Selections" daily)   Diet/type: Regular consistency (see orders) DVT prophylaxis: LMWH GI prophylaxis: PPI Lines: N/A Foley:  N/A Code Status:  full code Last date of multidisciplinary goals of care discussion [ ]   Labs   CBC: Recent Labs  Lab 04/08/23 2008  WBC 10.6*  NEUTROABS 9.3*  HGB 6.3*  HCT 20.8*  MCV 87.0  PLT 371    Basic Metabolic Panel: Recent Labs  Lab 04/08/23 1822  NA 129*  K 3.6  CL 97*  CO2 20*  GLUCOSE 100*  BUN 9  CREATININE 0.83  CALCIUM 7.6*   GFR: CrCl cannot be calculated (Unknown ideal weight.). Recent Labs  Lab 04/08/23 1828 04/08/23 2008 04/08/23 2219  WBC  --  10.6*  --   LATICACIDVEN 2.8* 2.4* 2.1*    Liver Function Tests: Recent Labs  Lab 04/08/23 1822  AST 23  ALT 14  ALKPHOS 98  BILITOT 0.4  PROT 7.2  ALBUMIN <1.5*   No results for input(s): "LIPASE", "AMYLASE" in the last 168 hours. No results for input(s): "AMMONIA" in the last 168 hours.  ABG    Component Value Date/Time   PHART 7.377 08/29/2021 2237   PCO2ART 32.4 08/29/2021 2237   PO2ART 235 (H) 08/29/2021 2237   HCO3 24.7 06/23/2022 1736   TCO2 25 08/29/2021 1538   ACIDBASEDEF 5.1 (H) 08/29/2021 2237   O2SAT 84.3 06/23/2022 1736     Coagulation Profile: Recent Labs  Lab 04/08/23 1822  INR 1.3*    Cardiac Enzymes: No results for input(s): "CKTOTAL", "CKMB", "CKMBINDEX", "TROPONINI" in the last 168 hours.  HbA1C: Hgb A1c MFr Bld  Date/Time Value Ref Range Status  01/05/2022 05:19 AM 4.6 (L) 4.8 - 5.6 % Final    Comment:    (NOTE) Pre diabetes:          5.7%-6.4%  Diabetes:              >6.4%  Glycemic control for   <7.0% adults with diabetes   02/02/2021 04:44 AM 5.1 4.8 - 5.6 % Final    Comment:    (NOTE) Pre diabetes:          5.7%-6.4%  Diabetes:              >6.4%  Glycemic control for   <7.0% adults with diabetes     CBG: No results for input(s): "GLUCAP" in the  last 168 hours.  Review of Systems:    Bolds are positive  Constitutional: weight loss, gain, night sweats, Fevers, chills, fatigue .  HEENT: headaches, Sore throat, sneezing, nasal congestion, post nasal drip, Difficulty swallowing, Tooth/dental problems, visual complaints visual changes, ear ache CV:  chest pain, radiates:,Orthopnea, PND, swelling in lower extremities, dizziness, palpitations, syncope.  GI  heartburn, indigestion, abdominal pain, nausea, vomiting, diarrhea, change in bowel habits, loss of appetite, bloody stools.  Resp: cough, productive: , hemoptysis, dyspnea, chest pain, pleuritic.  Skin: rash or itching or icterus GU: dysuria, change in color of urine, urgency or frequency. flank pain, hematuria  MS: bilateral leg paresthesia and spasm . decreased range of motion  Psych: change in mood or affect. depression or anxiety.  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   Surgical History:   Past Surgical History:  Procedure Laterality Date   banding procedure for morbid obesity     I & D EXTREMITY Right 02/27/2014   Procedure: IRRIGATION AND DEBRIDEMENT FOREARM AND REPAIR OF 30cm LACERATION;  Surgeon: Eulas Post, MD;  Location: [facility] OR;  Service: Orthopedics;  Laterality: Right;  Anesthesia Regional with MAC   TEE WITHOUT CARDIOVERSION N/A 08/01/2021   Procedure: TRANSESOPHAGEAL ECHOCARDIOGRAM (TEE);  Surgeon: Chrystie Nose, MD;  Location: [facility] ENDOSCOPY;  Service: Cardiovascular;  Laterality: N/A;   TEE WITHOUT CARDIOVERSION N/A 06/28/2022   Procedure: TRANSESOPHAGEAL ECHOCARDIOGRAM (TEE);  Surgeon: Chrystie Nose, MD;  Location: [facility] ENDOSCOPY;  Service: Cardiovascular;  Laterality: N/A;     Social History:   reports that he has been smoking cigarettes. He has never used smokeless tobacco. He reports that he does not currently use alcohol. He reports current drug use. Drugs: Marijuana and Methamphetamines.   Family History:  His family history includes Bipolar disorder in his father; Depression in his mother.   Allergies Allergies  Allergen Reactions   Caffeine Anaphylaxis   Chocolate Anaphylaxis   Tuberculin, Ppd     Other reaction(s): Eruption of skin   Sulfa Antibiotics Rash     Home Medications  Prior to Admission medications   Medication Sig Start Date End Date Taking? Authorizing Provider  acetaminophen (TYLENOL) 325 MG tablet Take 650 mg by mouth 2 (two) times daily as needed for mild pain. 03/16/22   [provider]  ARIPiprazole (ABILIFY) 30 MG tablet Take 15 mg by mouth daily. 03/16/22   [provider]  Calcium Carbonate 500 MG CHEW Chew 500 mg by mouth 2 (two) times daily as needed (gerd). 03/16/22   [provider]  midodrine (PROAMATINE) 5 MG tablet Take 5 mg by mouth 2 (two) times daily as needed (dizziness when sitting up). 03/16/22   [provider]  omeprazole (PRILOSEC) 40 MG capsule Take 40 mg by mouth daily.    [provider]  pregabalin (LYRICA) 150 MG capsule Take 150 mg by mouth 2 (two) times daily.    [provider]  sertraline (ZOLOFT) 50 MG tablet Take 25 mg by mouth daily. 03/16/22   [provider]  tiZANidine (ZANAFLEX) 4 MG tablet Take 4 mg by mouth at bedtime as needed for muscle spasms. 03/16/22   [provider]  traZODone (DESYREL) 50 MG tablet Take 50 mg by mouth at bedtime. 03/16/22   [provider]  trospium (SANCTURA) 20 MG tablet Take 20 mg by mouth 2 (two) times daily.    [provider]  prazosin (MINIPRESS) 2 MG capsule Take 1 capsule (2 mg total) by mouth at  bedtime. Patient not taking: Reported on 09/03/2017 11/04/16 09/14/19  Kristeen Mans, NP     Critical care time: 46 minutes     Joneen Roach, AGACNP-BC [facility]  See Amion for personal pager PCCM on call pager [phone] until 7pm. Please call Elink 7p-7a. [phone]  04/09/2023 1:44 AM

## 2023-04-09 NOTE — ED Notes (Signed)
Pt Vital signs was recheck at 1 hr post blood transfusion and patient temp was elevated Dr. Gaynell Face was informed of same and order was received to give tylenol

## 2023-04-09 NOTE — Consult Note (Signed)
[facility]    Date of Admission:  04/08/2023     Total days of antibiotics 2               Reason for Consult: MRSA Bacteremia  Referring Provider: Candy Sledge / [employer] Primary Care Provider: [facility]   ASSESSMENT:  Steve [name] is a 32 y/o male with paraplegia complicated by continued decubitus ulcers and MRSA bacteremia and osteomyelitis now admitted with recurrent MRSA bacteremia from suspected decubitus ulcer infections and probably septic arthritis of the left hip. Awaiting Orthopedic evaluation of left hip possible surgical intervention. Continue current dose of vancomycin and stop piperacillin-tazobactam. Monitor renal function and vancomycin levels for therapeutic drug monitoring. Wound care per Orthopedics and Wound RN recommendations. Site offloading and optimization of nutritional intake as he remains at risk for complicated healing and recurrent infections. Recheck blood cultures for clearance of bacteremia. TTE is negative and will need to consider TEE to rule out endocarditis. Given MRSA bacteremia will need an extended course of antibiotics upon blood culture clearance and any additional findings. Remaining medical and supportive care per CCM.  PLAN:  Continue current dose of vancomycin. Discontinue piperacillin-tazobactam.  Repeat blood cultures for clearance of bacteremia. Wound care per Wound RN and Orthopedics Await evaluation by Orthopedics for any surgical interventions. Therapeutic drug monitoring of renal function and vancomycin levels.  Optimize site off loading and nutrition.  Remaining medical and supportive care per CCM.    Principal Problem:   Septic shock (HCC) Active Problems:   MRSA bacteremia   Septic arthritis (HCC)   Pressure ulcer    sodium chloride   Intravenous Once   Chlorhexidine Gluconate Cloth  6 each Topical Daily   leptospermum manuka honey  1 Application Topical Daily   midodrine  10 mg  Oral Q8H   pantoprazole (PROTONIX) IV  40 mg Intravenous Q12H     HPI: Steve [name] is a 32 y.o. male with previous medical history of paraplegia complicated by neurogenic bladder and chronic left pelvis osteomyelitis, chronic hypotension on midodrine, methamphetamine use, PTSD and paranoid schizophrenia admitted to the hospital with the chief complaint of weakness and malaise.   Generally receives care the at the [facility] and found to have purulent drainage from his bilateral pressure wounds in addition to abdominal pain. Febrile upon admission with temperature of 103.3 F and no leukocytosis. CT abdomen/pelvis with concern for septic arthritis of the left hip with osteomyelitis of the left ischium and inferior pubic ramis where decubitus ulcer is present; new healing fracture of the left acetabulum, chronic posterior left hip dislocation. Started on broad spectrum coverage with vancomycin and piperacillin-tazobactam. One set of blood cultures is positive for MRSA and Streptococcus species. Currently with multiple open wounds and lesions.   Steve [name] is known to the ID service having last been seen in October 2023 with MRSA bacteremia and TEE negative for vegetation that was treated with vancomycin through 10/22. 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 Has had previous infections with Pseudomonas putida, ESBL Proteus mirabilis and multi-drug resistant Providencia rettgeri.    Review of Systems: Review of Systems  Constitutional:  Negative for chills, fever and weight loss.  Respiratory:  Negative for cough, shortness of breath and wheezing.   Cardiovascular:  Negative for chest pain and leg swelling.  Gastrointestinal:  Negative for abdominal pain, constipation, diarrhea, nausea and vomiting.  Skin:  Negative for rash.  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    Anxiety    Anxiety and depression 11/12/2015   Chronic osteomyelitis, pelvis, left (HCC) 05/30/2021   Paranoid schizophrenia (HCC)    Paraplegia (HCC)    PTSD (post-traumatic stress disorder) Paranoid    Social History   Tobacco Use   Smoking status: Some Days    Current packs/day: 0.25    Types: Cigarettes   Smokeless tobacco: Never  Vaping Use   Vaping status: Unknown  Substance Use Topics   Alcohol use: Not Currently   Drug use: Yes    Types: Marijuana, Methamphetamines    Family History  Problem Relation Age of Onset   Depression Mother    Bipolar disorder Father     Allergies  Allergen Reactions   Caffeine Anaphylaxis   Chocolate Anaphylaxis   Bactrim [Sulfamethoxazole-Trimethoprim] Other (See Comments)    Reaction not listed.   Tuberculin, Ppd     Other reaction(s): Eruption of skin   Sulfa Antibiotics Rash    OBJECTIVE: Blood pressure 98/63, pulse 79, temperature 98.8 F (37.1 C), temperature source Oral, resp. rate 18, height 5\' 11"  (1.803 m), weight 72.6 kg, SpO2 98%.  Physical Exam Constitutional:      General: He is not in acute distress.    Appearance: He is well-developed.     Comments: Lying in bed with head of bed elevated; sleepy  Cardiovascular:     Rate and Rhythm: Normal rate and regular rhythm.     Heart sounds: Normal heart sounds.  Pulmonary:     Effort: Pulmonary effort is normal.     Breath sounds: Normal breath sounds.  Skin:    General: Skin is warm and dry.  Neurological:     Mental Status: He is alert.     Lab Results Lab Results  Component Value Date   WBC 8.2 04/09/2023   HGB 6.6 (LL) 04/09/2023   HCT 21.4 (L) 04/09/2023   MCV 87.7 04/09/2023   PLT 307 04/09/2023    Lab Results  Component Value Date   CREATININE 0.71 04/09/2023   BUN 8 04/09/2023   NA 126 (L) 04/09/2023   K 3.2 (L) 04/09/2023   CL 98 04/09/2023   CO2 18 (L) 04/09/2023    Lab Results  Component Value Date   ALT 14 04/08/2023   AST 23  04/08/2023   ALKPHOS 98 04/08/2023   BILITOT 0.4 04/08/2023     Microbiology: Recent Results (from the past 240 hour(s))  Culture, blood (Routine x 2)     Status: None (Preliminary result)   Collection Time: 04/08/23  6:22 PM   Specimen: BLOOD  Result Value Ref Range Status   Specimen Description BLOOD SITE NOT SPECIFIED  Final   Special Requests   Final    BOTTLES DRAWN AEROBIC ONLY Blood Culture results may not be optimal due to an inadequate volume of blood received in culture bottles   Culture   Final    NO GROWTH < 24 HOURS Performed at [facility], [address], [place], [place] [address]    Report Status PENDING  Incomplete  Culture, blood (Routine x 2)     Status: None (Preliminary result)   Collection Time: 04/08/23  8:08 PM   Specimen: BLOOD  Result Value Ref Range Status   Specimen Description BLOOD SITE NOT SPECIFIED  Final   Special Requests   Final    BOTTLES DRAWN AEROBIC  AND ANAEROBIC Blood Culture adequate volume   Culture  Setup Time   Final    GRAM POSITIVE COCCI IN CHAINS IN BOTH AEROBIC AND ANAEROBIC BOTTLES CRITICAL RESULT CALLED TO, READ BACK BY AND VERIFIED WITH: E. SINCLAIR PHARMD, AT 0940 D. VANHOOK GRAM POSITIVE COCCI IN CLUSTERS AEROBIC BOTTLE ONLY CRITICAL RESULT CALLED TO, READ BACK BY AND VERIFIED WITH: PHARMD EMILY SMarland Kitchen [phone] FCP Performed at [facility], [address] [place], [place] [address]    Culture GRAM POSITIVE COCCI  Final   Report Status PENDING  Incomplete  Blood Culture ID Panel (Reflexed)     Status: Abnormal   Collection Time: 04/08/23  8:08 PM  Result Value Ref Range Status   Enterococcus faecalis NOT DETECTED NOT DETECTED Final   Enterococcus Faecium NOT DETECTED NOT DETECTED Final   Listeria monocytogenes NOT DETECTED NOT DETECTED Final   Staphylococcus species NOT DETECTED NOT DETECTED Final   Staphylococcus aureus (BCID) NOT DETECTED NOT DETECTED Final   Staphylococcus epidermidis NOT DETECTED NOT  DETECTED Final   Staphylococcus lugdunensis NOT DETECTED NOT DETECTED Final   Streptococcus species DETECTED (A) NOT DETECTED Final    Comment: Not Enterococcus species, Streptococcus agalactiae, Streptococcus pyogenes, or Streptococcus pneumoniae. CRITICAL RESULT CALLED TO, READ BACK BY AND VERIFIED WITH: E. SINCLAIR PHARMD, AT 0940 D. VANHOOK    Streptococcus agalactiae NOT DETECTED NOT DETECTED Final   Streptococcus pneumoniae NOT DETECTED NOT DETECTED Final   Streptococcus pyogenes NOT DETECTED NOT DETECTED Final   A.calcoaceticus-baumannii NOT DETECTED NOT DETECTED Final   Bacteroides fragilis NOT DETECTED NOT DETECTED Final   Enterobacterales NOT DETECTED NOT DETECTED Final   Enterobacter cloacae complex NOT DETECTED NOT DETECTED Final   Escherichia coli NOT DETECTED NOT DETECTED Final   Klebsiella aerogenes NOT DETECTED NOT DETECTED Final   Klebsiella oxytoca NOT DETECTED NOT DETECTED Final   Klebsiella pneumoniae NOT DETECTED NOT DETECTED Final   Proteus species NOT DETECTED NOT DETECTED Final   Salmonella species NOT DETECTED NOT DETECTED Final   Serratia marcescens NOT DETECTED NOT DETECTED Final   Haemophilus influenzae NOT DETECTED NOT DETECTED Final   Neisseria meningitidis NOT DETECTED NOT DETECTED Final   Pseudomonas aeruginosa NOT DETECTED NOT DETECTED Final   Stenotrophomonas maltophilia NOT DETECTED NOT DETECTED Final   Candida albicans NOT DETECTED NOT DETECTED Final   Candida auris NOT DETECTED NOT DETECTED Final   Candida glabrata NOT DETECTED NOT DETECTED Final   Candida krusei NOT DETECTED NOT DETECTED Final   Candida parapsilosis NOT DETECTED NOT DETECTED Final   Candida tropicalis NOT DETECTED NOT DETECTED Final   Cryptococcus neoformans/gattii NOT DETECTED NOT DETECTED Final    Comment: Performed at [facility], [address], [place], [place] [address]  Blood Culture ID Panel (Reflexed)     Status: Abnormal   Collection Time: 04/08/23  8:08 PM   Result Value Ref Range Status   Enterococcus faecalis NOT DETECTED NOT DETECTED Final   Enterococcus Faecium NOT DETECTED NOT DETECTED Final   Listeria monocytogenes NOT DETECTED NOT DETECTED Final   Staphylococcus species DETECTED (A) NOT DETECTED Final    Comment: CRITICAL RESULT CALLED TO, READ BACK BY AND VERIFIED WITH: PHARMD EMILY S. [phone] FCP    Staphylococcus aureus (BCID) DETECTED (A) NOT DETECTED Final    Comment: Methicillin (oxacillin)-resistant Staphylococcus aureus (MRSA). MRSA is predictably resistant to beta-lactam antibiotics (except ceftaroline). Preferred therapy is vancomycin unless clinically contraindicated. Patient requires contact precautions if  hospitalized. CRITICAL RESULT  CALLED TO, READ BACK BY AND VERIFIED WITH: PHARMD EMILY S. [phone] FCP    Staphylococcus epidermidis NOT DETECTED NOT DETECTED Final   Staphylococcus lugdunensis NOT DETECTED NOT DETECTED Final   Streptococcus species DETECTED (A) NOT DETECTED Final    Comment: Not Enterococcus species, Streptococcus agalactiae, Streptococcus pyogenes, or Streptococcus pneumoniae. CRITICAL RESULT CALLED TO, READ BACK BY AND VERIFIED WITH: PHARMD EMILY S. [phone] FCP    Streptococcus agalactiae NOT DETECTED NOT DETECTED Final   Streptococcus pneumoniae NOT DETECTED NOT DETECTED Final   Streptococcus pyogenes NOT DETECTED NOT DETECTED Final   A.calcoaceticus-baumannii NOT DETECTED NOT DETECTED Final   Bacteroides fragilis NOT DETECTED NOT DETECTED Final   Enterobacterales NOT DETECTED NOT DETECTED Final   Enterobacter cloacae complex NOT DETECTED NOT DETECTED Final   Escherichia coli NOT DETECTED NOT DETECTED Final   Klebsiella aerogenes NOT DETECTED NOT DETECTED Final   Klebsiella oxytoca NOT DETECTED NOT DETECTED Final   Klebsiella pneumoniae NOT DETECTED NOT DETECTED Final   Proteus species NOT DETECTED NOT DETECTED Final   Salmonella species NOT DETECTED NOT DETECTED Final   Serratia  marcescens NOT DETECTED NOT DETECTED Final   Haemophilus influenzae NOT DETECTED NOT DETECTED Final   Neisseria meningitidis NOT DETECTED NOT DETECTED Final   Pseudomonas aeruginosa NOT DETECTED NOT DETECTED Final   Stenotrophomonas maltophilia NOT DETECTED NOT DETECTED Final   Candida albicans NOT DETECTED NOT DETECTED Final   Candida auris NOT DETECTED NOT DETECTED Final   Candida glabrata NOT DETECTED NOT DETECTED Final   Candida krusei NOT DETECTED NOT DETECTED Final   Candida parapsilosis NOT DETECTED NOT DETECTED Final   Candida tropicalis NOT DETECTED NOT DETECTED Final   Cryptococcus neoformans/gattii NOT DETECTED NOT DETECTED Final   Meth resistant mecA/C and MREJ DETECTED (A) NOT DETECTED Final    Comment: CRITICAL RESULT CALLED TO, READ BACK BY AND VERIFIED WITH: PHARMD EMILY S. [phone] FCP Performed at [facility], [address] [place], [place] [address]      Marcos Eke, NP [facility]  04/09/2023  3:14 PM

## 2023-04-09 NOTE — Progress Notes (Signed)
-  late entry for 0420 Unable to finish skin assessment on admission. With assistance of charge RN able to capture pictures of wounds on patient's legs and arms. When we went to assess the wounds on the ischium patient started to get anxious and agitated. Stated he needed a break and wanted [place] to stop. Elink provider contacted. Agitation, pain and patient's c/o fever all addressed.  Concerns passed on to [facility] provider also regarding [place] IV to left forearm related to possible infiltration. IV team contacted for new placement of [place] PIV, Norepinephrine stopped until new [place] PIV could be placed and a bolus of LR was started per the [facility] MD orders.    1610. Patient resting currently now, labs drawn but still refusing to let this RN finish skin assessment and dressings of ischium.

## 2023-04-09 NOTE — Progress Notes (Signed)
NAME:  Steve [name], MRN:  [id number], DOB:  1991/08/28, LOS: 0 ADMISSION DATE:  04/08/2023, CONSULTATION DATE:  7/23 REFERRING MD:  Dr. Posey Rea, CHIEF COMPLAINT:  Shock   History of Present Illness:  32 year old male with past medical history as below which is significant for paraplegia, substance abuse issues, psychostimulant induced psychosis, neurogenic bladder, chronic hypotension on midodrine, chronic osteomyelitis, paranoid schizophrenia, and PTSD.  He receives most of his medical care through the [place] where he was recently admitted for infections of bilateral ischial wounds.  He is currently a poor historian.  He tells me since his discharge from the [place] he has been quite lazy and not really doing much to care for himself.  He has not been eating and drinking well the past few days.  He has used methamphetamine as recently as 1 week prior to presentation.  He lives at home with his wife and uses a wheelchair to get around but has not really gotten out of bed much in the past couple weeks.  He also reports he has not been taking any of his medications because he does not think to do anything.    He presented to [facility] emergency department on 7/22 with complaints of weakness and malaise.  Upon arrival arrival to the emergency department he was noted to have purulent drainage from his bilateral pressure wounds as well as abdominal pain.  He was started on IV antibiotics for presumed cellulitis versus osteomyelitis surrounding the wounds.  CT of the abdomen and pelvis showed findings worrisome for septic arthritis and osteomyelitis of the left hip.  Chronic posterior left hip dislocation.  Small amount of air also noted in the bladder.  He is planning to be admitted to the hospitalist service, however, he then became hypotensive refractory to volume resuscitation and was started on norepinephrine infusion.  PCCM was asked to admit to ICU.  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   Significant Hospital Events: Including procedures, antibiotic start and stop dates in addition to other pertinent events   07/23: Admitted to ICU in Septic Shock   Interim History / Subjective:  Overnight: Patient admitted to ICU.  Started on pressors. Given 3.7L yesterday   Patient evaluated bedside this morning.  On chart review, patient admitted to [facility] for weeklong stay with MRSA bacteremia causing septic shock requiring pressors in oct 2023.  Patient was followed by ID, treated with vancomycin. At the time, patient did not have vegetations concerning for endocarditis. Patient evaluated at bedside this morning.  He states that over the past few days he has developed abdominal pain.  He denies any changes to his stool color.  He denies any bleeding episodes.  He denies any recent use of NSAIDs.  Patient does note that this abdominal pain is burning in nature, and is associated with eating meats.  Patient denies ever having EGD or colonoscopy.  He does report he had a cystoscopy about 2 weeks ago.  Objective   Blood pressure 121/72, pulse 77, temperature 98.1 F (36.7 C), temperature source Oral, resp. rate 19, height 5\' 11"  (1.803 m), weight 72.6 kg, SpO2 100%.        Intake/Output Summary (Last 24 hours) at 04/09/2023 1057 Last data filed at 04/09/2023 0931 Gross per  24 hour  Intake 4973.77 ml  Output 1850 ml  Net 3123.77 ml   Filed Weights   04/09/23 0200  Weight: 72.6 kg    Examination: General: Cachectic, resting bed, no acute distress HENT: Normocephalic, atraumatic, EOMI Lungs: Clear to auscultation bilaterally, unlabored breathing on room air Cardiovascular: Regular rate  and rhythm, no murmurs, rubs, or gallops Abdomen: Nontender, nondistended, soft, normal active bowel sounds Extremities: Dressings noted to bilateral ischial wounds. Neuro: Able to follow instructions, paralyzed from waist down     Labs reviewed: UA: Negative for nitrites, with leukocytes and many bacteria and many bacteria.  Ammonium biurate crystals present Glucose 98-122 Magnesium 1.4 Phosphorus 3.3 BMP sodium 126, potassium 3.2, bicarb 18,  CBC white blood cell count 8.2, hemoglobin 6.6  Blood cultures: Streptococcus species, 1 of 4 bottles  Resolved Hospital Problem list     Assessment & Plan:   This is a 32 year old male with a past medical history of paraplegia, neurogenic bladder, chronic osteomyelitis who presents emergency department concerns of acute abdominal pain.  Imaging revealed concern for left hip septic arthritis/osteomyelitis.  Patient had hypotension refractory to fluids and admitted to the ICU with concerns for septic shock in the setting of septic arthritis/osteomyelitis of the left hip.  #Septic shock #Septic arthritis/osteomyelitis of left hip #Streptococcal bacteremia Patient evaluated bedside this morning.  Patient states he is still having some abdominal pain.  He denies any other concerns this morning.  Blood cultures growing Streptococcus.  Patient was evaluated by orthopedics who did not take patient for emergency surgery for washout.  Patient is on day 1 of Zosyn and vancomycin.  Orthopedics following.  Wound care following.  Patient is currently on for Levophed. -Continue to wean Levophed MAP goal greater than 65 -Zosyn and vancomycin day 1 -Follow-up blood cultures -Restart home midodrine 10 mg every 8 hours -Consult infectious disease  #Normocytic anemia Likely due to chronic disease, but will obtain iron studies.  Could also be related to critical illness.  Do not think patient is losing any blood.  Do not need GI involvement at this  time. -Transfuse 1 unit -Monitor CBC -Follow-up iron studies  #GERD Patient does have history of GERD.  This is likely related to patient's abdominal pain.  I do not think patient has pancreatitis, cholecystitis, or any kind of abdominal ischemia.  Patient has no tenderness on my exam.  Patient does not have concern for peritonitic abdomen. -Pantoprazole has seemed to help -Continue pantoprazole -Resume diet  #Hyponatremia #Hypokalemia #Hypomagnesemia #Non-anion gap metabolic acidosis Despite aggressive volume resuscitation, sodium fell to 126.  Will obtain urine studies.  Bicarb down to 18, likely in the setting of fluid administration. -Continue lactated Ringer's -Obtain urine studies -Replete potassium -Replete magnesium  #Neurogenic bladder #Paraplegia #CT findings suggestive of cystitis Patient denies any any symptoms of dysuria, hematuria, or urinary frequency or urgency.  Patient did have some bladder wall thickening on CT imaging suggestive of cystitis, but given asymptomatic less likely.  Patient did have cystoscopy 2 weeks ago, likely reason why patient is having edema. -Monitor output -Foley in place  #Schizophrenia #Substance abuse  Will do med reconciliation, and restart home meds. -Restart home meds after med rec completed  Best Practice (right click and "Reselect all SmartList Selections" daily)   Diet/type: Regular consistency (see orders) DVT prophylaxis: LMWH GI prophylaxis: PPI Lines: N/A Foley:  N/A Code Status:  full code Last date of multidisciplinary goals of care discussion [ ]   Labs  CBC: Recent Labs  Lab 04/08/23 2008 04/09/23 0637  WBC 10.6* 8.2  NEUTROABS 9.3*  --   HGB 6.3* 6.6*  HCT 20.8* 21.4*  MCV 87.0 87.7  PLT 371 307    Basic Metabolic Panel: Recent Labs  Lab 04/08/23 1822 04/09/23 0637  NA 129* 126*  K 3.6 3.2*  CL 97* 98  CO2 20* 18*  GLUCOSE 100* 123*  BUN 9 8  CREATININE 0.83 0.71  CALCIUM 7.6* 7.1*  MG  --   1.4*  PHOS  --  3.3   GFR: Estimated Creatinine Clearance: 134.9 mL/min (by C-G formula based on SCr of 0.71 mg/dL). Recent Labs  Lab 04/08/23 1828 04/08/23 2008 04/08/23 2219 04/09/23 0637  WBC  --  10.6*  --  8.2  LATICACIDVEN 2.8* 2.4* 2.1*  --     Liver Function Tests: Recent Labs  Lab 04/08/23 1822  AST 23  ALT 14  ALKPHOS 98  BILITOT 0.4  PROT 7.2  ALBUMIN <1.5*   No results for input(s): "LIPASE", "AMYLASE" in the last 168 hours. No results for input(s): "AMMONIA" in the last 168 hours.  ABG    Component Value Date/Time   PHART 7.377 08/29/2021 2237   PCO2ART 32.4 08/29/2021 2237   PO2ART 235 (H) 08/29/2021 2237   HCO3 24.7 06/23/2022 1736   TCO2 25 08/29/2021 1538   ACIDBASEDEF 5.1 (H) 08/29/2021 2237   O2SAT 84.3 06/23/2022 1736     Coagulation Profile: Recent Labs  Lab 04/08/23 1822  INR 1.3*    Cardiac Enzymes: No results for input(s): "CKTOTAL", "CKMB", "CKMBINDEX", "TROPONINI" in the last 168 hours.  HbA1C: Hgb A1c MFr Bld  Date/Time Value Ref Range Status  01/05/2022 05:19 AM 4.6 (L) 4.8 - 5.6 % Final    Comment:    (NOTE) Pre diabetes:          5.7%-6.4%  Diabetes:              >6.4%  Glycemic control for   <7.0% adults with diabetes   02/02/2021 04:44 AM 5.1 4.8 - 5.6 % Final    Comment:    (NOTE) Pre diabetes:          5.7%-6.4%  Diabetes:              >6.4%  Glycemic control for   <7.0% adults with diabetes     CBG: Recent Labs  Lab 04/09/23 0343 04/09/23 0751  GLUCAP 122* 110*    Review of Systems:    Patient endorses abdominal pain. Back pain, hand pain, and fevers.   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   Surgical History:   Past Surgical History:  Procedure Laterality Date   banding procedure for morbid obesity     I & D EXTREMITY Right 02/27/2014   Procedure: IRRIGATION AND DEBRIDEMENT FOREARM AND REPAIR OF 30cm LACERATION;  Surgeon: Eulas Post, MD;  Location: [facility] OR;  Service: Orthopedics;  Laterality: Right;  Anesthesia Regional with MAC   TEE WITHOUT CARDIOVERSION N/A 08/01/2021   Procedure: TRANSESOPHAGEAL ECHOCARDIOGRAM (TEE);  Surgeon: Chrystie Nose, MD;  Location: [facility] ENDOSCOPY;  Service: Cardiovascular;  Laterality: N/A;   TEE WITHOUT CARDIOVERSION N/A 06/28/2022   Procedure: TRANSESOPHAGEAL ECHOCARDIOGRAM (TEE);  Surgeon: Chrystie Nose, MD;  Location: [facility]  ENDOSCOPY;  Service: Cardiovascular;  Laterality: N/A;     Social History:   reports that he has been smoking cigarettes. He has never used smokeless tobacco. He reports that he does not currently use alcohol. He reports current drug use. Drugs: Marijuana and Methamphetamines.   Family History:  His family history includes Bipolar disorder in his father; Depression in his mother.   Allergies Allergies  Allergen Reactions   Caffeine Anaphylaxis   Chocolate Anaphylaxis   Tuberculin, Ppd     Other reaction(s): Eruption of skin   Sulfa Antibiotics Rash     Home Medications  Prior to Admission medications   Medication Sig Start Date End Date Taking? Authorizing Provider  acetaminophen (TYLENOL) 325 MG tablet Take 650 mg by mouth 2 (two) times daily as needed for mild pain. 03/16/22   [provider]  ARIPiprazole (ABILIFY) 30 MG tablet Take 15 mg by mouth daily. 03/16/22   [provider]  Calcium Carbonate 500 MG CHEW Chew 500 mg by mouth 2 (two) times daily as needed (gerd). 03/16/22   [provider]  midodrine (PROAMATINE) 5 MG tablet Take 5 mg by mouth 2 (two) times daily as needed (dizziness when sitting up). 03/16/22   [provider]  omeprazole (PRILOSEC) 40 MG  capsule Take 40 mg by mouth daily.    [provider]  pregabalin (LYRICA) 150 MG capsule Take 150 mg by mouth 2 (two) times daily.    [provider]  sertraline (ZOLOFT) 50 MG tablet Take 25 mg by mouth daily. 03/16/22   [provider]  tiZANidine (ZANAFLEX) 4 MG tablet Take 4 mg by mouth at bedtime as needed for muscle spasms. 03/16/22   [provider]  traZODone (DESYREL) 50 MG tablet Take 50 mg by mouth at bedtime. 03/16/22   [provider]  trospium (SANCTURA) 20 MG tablet Take 20 mg by mouth 2 (two) times daily.    [provider]  prazosin (MINIPRESS) 2 MG capsule Take 1 capsule (2 mg total) by mouth at bedtime. Patient not taking: Reported on 09/03/2017 11/04/16 09/14/19  Kristeen Mans, NP     Critical care time: 35 minutes     Modena Slater, DO  Internal Medicine Resident PGY-2  [phone]  PCCM on call pager [phone] until 7pm. Please call Elink 7p-7a. [phone]  04/09/2023 10:57 AM

## 2023-04-09 NOTE — Progress Notes (Signed)
eLink Physician-Brief Progress Note Patient Name: Steve [name] DOB: 1991-01-22 MRN: [id number]   Date of Service  04/09/2023  HPI/Events of Note  32 year old male with a history of paraplegia, substance abuse, neurogenic bladder and chronic osteomyelitis with chronic sacral wounds.  He presented with increased weakness, malaise, and purulent drainage from his sacral wounds and ultimately developed septic shock requiring norepinephrine infusion.  Patient is febrile, tachycardic, hypotensive requiring norepinephrine and was administered broad-spectrum antibiotics.  He is anemic with a dirty urinalysis and blood cultures been collected.  CT abdomen concerning for left acetabular fracture and left hip osteomyelitis without evidence of intra-abdominal abscesses.  eICU Interventions  Maintain broad-spectrum antibiotics Orthopedic surgery consulted Status post 1 unit PRBC GI prophylaxis with therapeutic PPI IV twice daily in setting of potential bleed. DVT prophylaxis held in the setting of potential bleed.  Add Oxycodone Sliding scale for diffuse abdominal pain Add Baclofen for chronic spasms One time Zyprexa in the setting of agitation One-time additional acetaminophen for persistent fevers     Intervention Category Evaluation Type: New Patient Evaluation  [name] 04/09/2023, 3:56 AM

## 2023-04-09 NOTE — Progress Notes (Signed)
An USGPIV (ultrasound guided PIV) has been placed for short-term vasopressor infusion. A correctly placed ivWatch must be used when administering vasopressors. Should this treatment be needed beyond 72 hours, central line access should be obtained.  It will be the responsibility of the bedside nurse to follow best practice to prevent extravasations.   

## 2023-04-09 NOTE — Progress Notes (Signed)
  Echocardiogram 2D Echocardiogram has been performed.  Steve [name] 04/09/2023, 12:40 PM

## 2023-04-09 NOTE — Progress Notes (Signed)
Pharmacy Antibiotic Note  Steve [name] is a 32 y.o. male admitted on 04/08/2023 with osteomyelitis and previous MRSA history.Patient ha history of quadriplegia and pelvic osteomyelitis with purulent drainage from wounds. Patient had MRSA bacteremia in 06/2022 with source being the chronic OM and negative TEE for vegetations.  Pharmacy has been consulted for zosyn and vancomycin dosing.WBC 10.6, sCr 0.83, CRP 13.8, lactate 2.1, and tmax of 100.5  Received 1500 mg of vanc and zosyn 3.375 g IV x1 in ED  Plan: Zosyn 3.375 g IV every 8 hours  Vancomycin 1500 mg IV x1 Vancomycin 1000 mg IV every 8 hours (AUC 493, Vd 0.72, sCr 0.83) Monitor renal function Follow up cultures, LOT, and signs of clinical improvement  Temp (24hrs), Avg:99.1 F (37.3 C), Min:97.9 F (36.6 C), Max:100.5 F (38.1 C)  Recent Labs  Lab 04/08/23 1822 04/08/23 1828 04/08/23 2008 04/08/23 2219  WBC  --   --  10.6*  --   CREATININE 0.83  --   --   --   LATICACIDVEN  --  2.8* 2.4* 2.1*    CrCl cannot be calculated (Unknown ideal weight.).    Allergies  Allergen Reactions   Caffeine Anaphylaxis   Chocolate Anaphylaxis   Tuberculin, Ppd     Other reaction(s): Eruption of skin   Sulfa Antibiotics Rash    Antimicrobials this admission: Vancomycin 7/22 >>  Zosyn 7/22 >>   Microbiology results: 7/22 BCx:  7/23 UCx:    Thank you for allowing pharmacy to be a part of this patient's care.  Arabella Merles, PharmD. Clinical Pharmacist 04/09/2023 2:08 AM

## 2023-04-09 NOTE — Plan of Care (Signed)
  Problem: Education: Goal: Knowledge of General Education information will improve Description: Including pain rating scale, medication(s)/side effects and non-pharmacologic comfort measures Outcome: Not Progressing   Problem: Health Behavior/Discharge Planning: Goal: Ability to manage health-related needs will improve Outcome: Not Progressing   Problem: Coping: Goal: Level of anxiety will decrease Outcome: Progressing

## 2023-04-09 NOTE — Consult Note (Addendum)
WOC Nurse Consult Note: Reason for Consult:Consult requested for multiple wounds.  I did not assess bilat ischium wounds since the dressings had just been changed by the bedside nurse; reviewed photos in the EMR.  Ortho service is following for assessment and plan of care for these chronic Stage 4 wounds related to chronic hip dislocation and sepsis; refer to their previous consult notes.  Pt is critically ill with multiple systemic factors which can impair healing.  Right posterior knee full thickness wound, red and moist 2X2X.1cm Right anterior knee full thickness wounds .5X.5X.1cm and 5X3X.2cm, 90% red, 10% yellow, mod amt yellow drainage Right lower leg full thickness wound 3X2X.2cm, red and moist, mod amt yellow drainage Left elbow full thickness wound .8X.8X.1cm, red and moist Left posterior arm full thickness wound 4X2X.1cm, yellow and moist Left posterior knee 100% black eschar, 3X3cm Left outer ankle 4X4cm Unstageable pressure injury black eschar/yellow slough  Left anterior knee full thickness wound 7X7X.2cm, 90% red, 10% yellow, mod amt yellow drainage Left posterior calf full thickness wound 50% yed, 50% yellow, mod amt yellow drainage Left anterior foot full thickness wounds 4X3X.2cm and1.5X1.5cm, 50% red, 50% yellow, mod amt yellow drainage.  Pressure Injury POA: Yes Dressing procedure/placement/frequency: Topical treatment orders provided for bedside nurses to perform as follows:  1. Apply Medihoney to left posterior knee, left outer ankle, and 2 wounds to left anterior foot Q day, then cover with foam dressings.  Change foam dressings Q 3 days or PRN soiling 2. Apply moist fluffed kerlex to bilat ischium Q day, using swab to fill, then cover with foam dressings. Change foam dressings Q 3 days or PRN soiling 3. Foam dressings to various wounds to body, change Q 3 days or PRN soiling. Please re-consult if further assistance is needed.  Thank-you,  Cammie Mcgee MSN, RN, CWOCN,  [place], CNS [phone]

## 2023-04-09 NOTE — Progress Notes (Signed)
Pharmacy Antibiotic Note  Steve [name] is a 32 y.o. male admitted on 04/08/2023 with  bacteremia and septic arthritis .  Pharmacy has been consulted for Zosyn and vancomycin dosing.  Noted that patient is paraplegic. Scr is 0.71 and likely at baseline. BCID did show strep in 1/3 bottles.   Plan: Adjust vancomycin to 1000 mg Q12hr  Goal trough: 15-20 Continue to monitor renal function and clinical status  Follow-up ID recommendations and culture results   Height: 5\' 11"  (180.3 cm) Weight: 72.6 kg (160 lb) IBW/kg (Calculated) : 75.3  Temp (24hrs), Avg:99.8 F (37.7 C), Min:97.5 F (36.4 C), Max:103.3 F (39.6 C)  Recent Labs  Lab 04/08/23 1822 04/08/23 1828 04/08/23 2008 04/08/23 2219 04/09/23 0637  WBC  --   --  10.6*  --  8.2  CREATININE 0.83  --   --   --  0.71  LATICACIDVEN  --  2.8* 2.4* 2.1*  --     Estimated Creatinine Clearance: 134.9 mL/min (by C-G formula based on SCr of 0.71 mg/dL).    Allergies  Allergen Reactions   Caffeine Anaphylaxis   Chocolate Anaphylaxis   Tuberculin, Ppd     Other reaction(s): Eruption of skin   Sulfa Antibiotics Rash    Antimicrobials this admission: Zosyn 7/22 >>  Vancomycin 7/22 >>    Microbiology results: 7/22 BCx: strep species  (BCID) 7/23 UCx: pending  7/23 MRSA PCR: pending  Thank you for allowing pharmacy to be a part of this patient's care.  Lennie Muckle, PharmD PGY1 Pharmacy Resident 04/09/2023 11:36 AM

## 2023-04-09 NOTE — Consult Note (Signed)
Reason for Consult:Pelvic/hip abscess Referring Physician: Cyril Mourning Time called: 0730 Time at bedside: 0939   Steve [name] is an 32 y.o. male.  HPI: Steve [name] was admitted yesterday with sepsis as least partially stemming from 2 large infected ischial wounds with osteo. He normally gets these cared for at the [place]. CT also showed likely septic arthritis of the left hip with a chronic hip dislocation and orthopedic surgery was asked to consult. He is mostly insensate from pelvis distally.  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   Anxiety    Anxiety and depression 11/12/2015   Chronic osteomyelitis, pelvis, left (HCC) 05/30/2021   Paranoid schizophrenia (HCC)    Paraplegia (HCC)    PTSD (post-traumatic stress disorder) Paranoid    Past Surgical History:  Procedure Laterality Date   banding procedure for morbid obesity     I & D EXTREMITY Right 02/27/2014   Procedure: IRRIGATION AND DEBRIDEMENT FOREARM AND REPAIR OF 30cm LACERATION;  Surgeon: Eulas Post, MD;  Location: [facility] OR;  Service: Orthopedics;  Laterality: Right;  Anesthesia Regional with MAC   TEE WITHOUT CARDIOVERSION N/A 08/01/2021   Procedure: TRANSESOPHAGEAL ECHOCARDIOGRAM (TEE);  Surgeon: Chrystie Nose, MD;  Location: [facility] ENDOSCOPY;  Service: Cardiovascular;  Laterality: N/A;   TEE WITHOUT CARDIOVERSION N/A 06/28/2022   Procedure: TRANSESOPHAGEAL ECHOCARDIOGRAM (TEE);  Surgeon: Chrystie Nose, MD;  Location: [facility] ENDOSCOPY;  Service: Cardiovascular;  Laterality: N/A;    Family History  Problem Relation Age of Onset   Depression Mother    Bipolar disorder Father     Social History:  reports that he has been smoking cigarettes. He has never used smokeless tobacco. He reports that he does not currently use alcohol. He reports current drug use. Drugs: Marijuana and Methamphetamines.  Allergies:  Allergies  Allergen Reactions    Caffeine Anaphylaxis   Chocolate Anaphylaxis   Tuberculin, Ppd     Other reaction(s): Eruption of skin   Sulfa Antibiotics Rash    Medications: I have reviewed the patient's current medications.  Results for orders placed or performed during the hospital encounter of 04/08/23 (from the past 48 hour(s))  Comprehensive metabolic panel     Status: Abnormal   Collection Time: 04/08/23  6:22 PM  Result Value Ref Range   Sodium 129 (L) 135 - 145 mmol/L   Potassium 3.6 3.5 - 5.1 mmol/L   Chloride 97 (L) 98 - 111 mmol/L   CO2 20 (L) 22 - 32 mmol/L   Glucose, Bld 100 (H) 70 - 99 mg/dL    Comment: Glucose reference range applies only to samples taken after fasting for at least 8 hours.   BUN 9 6 - 20 mg/dL   Creatinine, Ser 6.57 0.61 - 1.24 mg/dL   Calcium 7.6 (L) 8.9 - 10.3 mg/dL   Total Protein 7.2 6.5 - 8.1 g/dL   Albumin <8.4 (L) 3.5 - 5.0 g/dL   AST 23 15 - 41 U/L   ALT 14 0 - 44 U/L   Alkaline Phosphatase 98 38 - 126 U/L   Total Bilirubin 0.4 0.3 - 1.2 mg/dL   GFR, Estimated >69 >62 mL/min    Comment: (NOTE) Calculated using the CKD-EPI Creatinine Equation (2021)    Anion gap 12 5 - 15    Comment: Performed at [facility], [address], [place], [place] [address]  Protime-INR     Status: Abnormal   Collection Time: 04/08/23  6:22 PM  Result Value Ref Range   Prothrombin Time 16.6 (H) 11.4 - 15.2 seconds   INR 1.3 (H) 0.8 - 1.2    Comment: (NOTE) INR goal varies based on device and disease states. Performed at [facility], [address], [place], [place] [address]   Culture, blood (Routine x 2)     Status: None (Preliminary result)   Collection Time: 04/08/23  6:22 PM   Specimen: BLOOD  Result Value Ref Range   Specimen Description BLOOD SITE NOT SPECIFIED    Special Requests      BOTTLES DRAWN AEROBIC ONLY Blood Culture results may not be optimal due to an inadequate volume of blood received in culture bottles   Culture      NO GROWTH < 24  HOURS Performed at [facility], [address] [place], [place] [address]    Report Status PENDING   I-Stat Lactic Acid, ED     Status: Abnormal   Collection Time: 04/08/23  6:28 PM  Result Value Ref Range   Lactic Acid, Venous 2.8 (HH) 0.5 - 1.9 mmol/L   Comment NOTIFIED PHYSICIAN   Culture, blood (Routine x 2)     Status: None (Preliminary result)   Collection Time: 04/08/23  8:08 PM   Specimen: BLOOD  Result Value Ref Range   Specimen Description BLOOD SITE NOT SPECIFIED    Special Requests      BOTTLES DRAWN AEROBIC AND ANAEROBIC Blood Culture adequate volume   Culture  Setup Time      GRAM POSITIVE COCCI IN CHAINS ANAEROBIC BOTTLE ONLY Organism ID to follow CRITICAL RESULT CALLED TO, READ BACK BY AND VERIFIED WITH: E. SINCLAIR PHARMD, AT 0981 Renato Shin Performed at [facility], [address], [place], [place] [address]    Culture GRAM POSITIVE COCCI    Report Status PENDING   Lactic acid, plasma     Status: Abnormal   Collection Time: 04/08/23  8:08 PM  Result Value Ref Range   Lactic Acid, Venous 2.4 (HH) 0.5 - 1.9 mmol/L    Comment: CRITICAL RESULT CALLED TO, READ BACK BY AND VERIFIED WITH Rudy Jew RN @ 2100 04/08/23 JBUTLER Performed at [facility], [address] [place], [place] [address]   CBC with Differential/Platelet     Status: Abnormal   Collection Time: 04/08/23  8:08 PM  Result Value Ref Range   WBC 10.6 (H) 4.0 - 10.5 K/uL   RBC 2.39 (L) 4.22 - 5.81 MIL/uL   Hemoglobin 6.3 (LL) 13.0 - 17.0 g/dL    Comment: REPEATED TO VERIFY THIS CRITICAL RESULT HAS VERIFIED AND BEEN CALLED TO ASHLEY JAMES RN BY SHERRY GALLOWAY ON 07 22 2024 AT 2129, AND HAS BEEN READ BACK.     HCT 20.8 (L) 39.0 - 52.0 %   MCV 87.0 80.0 - 100.0 fL   MCH 26.4 26.0 - 34.0 pg   MCHC 30.3 30.0 - 36.0 g/dL   RDW 21.3 (H) 08.6 - 57.8 %   Platelets 371 150 - 400 K/uL   nRBC 0.0 0.0 - 0.2 %   Neutrophils Relative % 88 %   Neutro Abs 9.3 (H) 1.7 - 7.7 K/uL    Lymphocytes Relative 7 %   Lymphs Abs 0.8 0.7 - 4.0 K/uL   Monocytes Relative 4 %   Monocytes Absolute 0.4 0.1 - 1.0 K/uL   Eosinophils Relative 0 %   Eosinophils Absolute 0.0 0.0 - 0.5 K/uL  Basophils Relative 0 %   Basophils Absolute 0.0 0.0 - 0.1 K/uL   Immature Granulocytes 1 %   Abs Immature Granulocytes 0.08 (H) 0.00 - 0.07 K/uL    Comment: Performed at [facility], [address], [place], [place] [address]  Blood Culture ID Panel (Reflexed)     Status: Abnormal   Collection Time: 04/08/23  8:08 PM  Result Value Ref Range   Enterococcus faecalis NOT DETECTED NOT DETECTED   Enterococcus Faecium NOT DETECTED NOT DETECTED   Listeria monocytogenes NOT DETECTED NOT DETECTED   Staphylococcus species NOT DETECTED NOT DETECTED   Staphylococcus aureus (BCID) NOT DETECTED NOT DETECTED   Staphylococcus epidermidis NOT DETECTED NOT DETECTED   Staphylococcus lugdunensis NOT DETECTED NOT DETECTED   Streptococcus species DETECTED (A) NOT DETECTED    Comment: Not Enterococcus species, Streptococcus agalactiae, Streptococcus pyogenes, or Streptococcus pneumoniae. CRITICAL RESULT CALLED TO, READ BACK BY AND VERIFIED WITH: E. SINCLAIR PHARMD, AT 0940 D. VANHOOK    Streptococcus agalactiae NOT DETECTED NOT DETECTED   Streptococcus pneumoniae NOT DETECTED NOT DETECTED   Streptococcus pyogenes NOT DETECTED NOT DETECTED   A.calcoaceticus-baumannii NOT DETECTED NOT DETECTED   Bacteroides fragilis NOT DETECTED NOT DETECTED   Enterobacterales NOT DETECTED NOT DETECTED   Enterobacter cloacae complex NOT DETECTED NOT DETECTED   Escherichia coli NOT DETECTED NOT DETECTED   Klebsiella aerogenes NOT DETECTED NOT DETECTED   Klebsiella oxytoca NOT DETECTED NOT DETECTED   Klebsiella pneumoniae NOT DETECTED NOT DETECTED   Proteus species NOT DETECTED NOT DETECTED   Salmonella species NOT DETECTED NOT DETECTED   Serratia marcescens NOT DETECTED NOT DETECTED   Haemophilus influenzae NOT DETECTED  NOT DETECTED   Neisseria meningitidis NOT DETECTED NOT DETECTED   Pseudomonas aeruginosa NOT DETECTED NOT DETECTED   Stenotrophomonas maltophilia NOT DETECTED NOT DETECTED   Candida albicans NOT DETECTED NOT DETECTED   Candida auris NOT DETECTED NOT DETECTED   Candida glabrata NOT DETECTED NOT DETECTED   Candida krusei NOT DETECTED NOT DETECTED   Candida parapsilosis NOT DETECTED NOT DETECTED   Candida tropicalis NOT DETECTED NOT DETECTED   Cryptococcus neoformans/gattii NOT DETECTED NOT DETECTED    Comment: Performed at [facility], [address] [place], [place] [address]  Prepare RBC (crossmatch)     Status: None   Collection Time: 04/08/23  9:41 PM  Result Value Ref Range   Order Confirmation      ORDER PROCESSED BY BLOOD BANK Performed at [facility], [address] [place] [address] [place], [place] [address]   POC occult blood, ED     Status: None   Collection Time: 04/08/23  9:44 PM  Result Value Ref Range   Fecal Occult Bld NEGATIVE NEGATIVE  Lactic acid, plasma     Status: Abnormal   Collection Time: 04/08/23 10:19 PM  Result Value Ref Range   Lactic Acid, Venous 2.1 (HH) 0.5 - 1.9 mmol/L    Comment: Performed at [facility], [address] [place], [place] [address]  Type and screen [facility]     Status: None (Preliminary result)   Collection Time: 04/08/23 10:19 PM  Result Value Ref Range   ABO/RH(D) A POS    Antibody Screen NEG    Sample Expiration      04/11/2023,2359 Performed at [facility], [address], [place], [place] [address]    Unit Number [id number]    Blood Component Type RED CELLS,LR    Unit division  00    Status of Unit ISSUED,FINAL    Transfusion Status OK TO TRANSFUSE    Crossmatch Result Compatible    Unit Number [id number]    Blood Component Type RED CELLS,LR    Unit division 00    Status of Unit ALLOCATED    Transfusion Status OK TO TRANSFUSE    Crossmatch Result Compatible   Sedimentation rate      Status: Abnormal   Collection Time: 04/08/23 11:34 PM  Result Value Ref Range   Sed Rate >140 (H) 0 - 16 mm/hr    Comment: Performed at [facility], [address] [place], [place] [address]  C-reactive protein     Status: Abnormal   Collection Time: 04/08/23 11:34 PM  Result Value Ref Range   CRP 13.8 (H) <1.0 mg/dL    Comment: Performed at [facility], [address] [place], [place] [address]  Urinalysis, w/ Reflex to Culture (Infection Suspected) -Urine, Catheterized     Status: Abnormal   Collection Time: 04/09/23 12:16 AM  Result Value Ref Range   Specimen Source URINE, CATHETERIZED    Color, Urine AMBER (A) YELLOW    Comment: BIOCHEMICALS MAY BE AFFECTED BY COLOR   APPearance TURBID (A) CLEAR   Specific Gravity, Urine 1.041 (H) 1.005 - 1.030   pH 8.0 5.0 - 8.0   Glucose, UA NEGATIVE NEGATIVE mg/dL   Hgb urine dipstick NEGATIVE NEGATIVE   Bilirubin Urine NEGATIVE NEGATIVE   Ketones, ur NEGATIVE NEGATIVE mg/dL   Protein, ur 962 (A) NEGATIVE mg/dL   Nitrite NEGATIVE NEGATIVE   Leukocytes,Ua LARGE (A) NEGATIVE   RBC / HPF 0-5 0 - 5 RBC/hpf   WBC, UA >50 0 - 5 WBC/hpf    Comment:        Reflex urine culture not performed if WBC <=10, OR if Squamous epithelial cells >5. If Squamous epithelial cells >5 suggest recollection.    Bacteria, UA MANY (A) NONE SEEN   Squamous Epithelial / HPF 0-5 0 - 5 /HPF   Mucus PRESENT    Triple Phosphate Crystal PRESENT    Non Squamous Epithelial 0-5 (A) NONE SEEN   Crystals AMMONIUM BIURATE CRYSTALS PRESENT (A) NEGATIVE    Comment: Performed at [facility], [address] [place], [place] [address]  Glucose, capillary     Status: Abnormal   Collection Time: 04/09/23  3:43 AM  Result Value Ref Range   Glucose-Capillary 122 (H) 70 - 99 mg/dL    Comment: Glucose reference range applies only to samples taken after fasting for at least 8 hours.  CBC     Status: Abnormal   Collection Time: 04/09/23  6:37 AM  Result  Value Ref Range   WBC 8.2 4.0 - 10.5 K/uL   RBC 2.44 (L) 4.22 - 5.81 MIL/uL   Hemoglobin 6.6 (LL) 13.0 - 17.0 g/dL    Comment: CRITICAL VALUE NOTED.  VALUE IS CONSISTENT WITH PREVIOUSLY REPORTED AND CALLED VALUE. REPEATED TO VERIFY    HCT 21.4 (L) 39.0 - 52.0 %   MCV 87.7 80.0 - 100.0 fL   MCH 27.0 26.0 - 34.0 pg   MCHC 30.8 30.0 - 36.0 g/dL   RDW 13.2 (H) 44.0 - 10.2 %   Platelets 307 150 - 400 K/uL    Comment: REPEATED TO VERIFY   nRBC 0.0 0.0 - 0.2 %    Comment: Performed at [facility], [address], [place], [place] [address]  Basic  metabolic panel     Status: Abnormal   Collection Time: 04/09/23  6:37 AM  Result Value Ref Range   Sodium 126 (L) 135 - 145 mmol/L   Potassium 3.2 (L) 3.5 - 5.1 mmol/L   Chloride 98 98 - 111 mmol/L   CO2 18 (L) 22 - 32 mmol/L   Glucose, Bld 123 (H) 70 - 99 mg/dL    Comment: Glucose reference range applies only to samples taken after fasting for at least 8 hours.   BUN 8 6 - 20 mg/dL   Creatinine, Ser 4.09 0.61 - 1.24 mg/dL   Calcium 7.1 (L) 8.9 - 10.3 mg/dL   GFR, Estimated >81 >19 mL/min    Comment: (NOTE) Calculated using the CKD-EPI Creatinine Equation (2021)    Anion gap 10 5 - 15    Comment: Performed at [facility], [address] [place], [place] [address]  Magnesium     Status: Abnormal   Collection Time: 04/09/23  6:37 AM  Result Value Ref Range   Magnesium 1.4 (L) 1.7 - 2.4 mg/dL    Comment: Performed at [facility], [address], [place], [place] [address]  Phosphorus     Status: None   Collection Time: 04/09/23  6:37 AM  Result Value Ref Range   Phosphorus 3.3 2.5 - 4.6 mg/dL    Comment: Performed at [facility], [address], [place], [place] [address]  Glucose, capillary     Status: Abnormal   Collection Time: 04/09/23  7:51 AM  Result Value Ref Range   Glucose-Capillary 110 (H) 70 - 99 mg/dL    Comment: Glucose reference range applies only to samples taken after fasting for at least 8  hours.  Prepare RBC (crossmatch)     Status: None   Collection Time: 04/09/23  9:30 AM  Result Value Ref Range   Order Confirmation      ORDER PROCESSED BY BLOOD BANK Performed at [facility], [address], [place], [place] [address]     CT ABDOMEN PELVIS W CONTRAST  Result Date: 04/08/2023 CLINICAL DATA:  Acute abdominal pain.  Multiple pelvic wounds. EXAM: CT ABDOMEN AND PELVIS WITH CONTRAST TECHNIQUE: Multidetector CT imaging of the abdomen and pelvis was performed using the standard protocol following bolus administration of intravenous contrast. RADIATION DOSE REDUCTION: This exam was performed according to the departmental dose-optimization program which includes automated exposure control, adjustment of the mA and/or kV according to patient size and/or use of iterative reconstruction technique. CONTRAST:  75mL OMNIPAQUE IOHEXOL 350 MG/ML SOLN COMPARISON:  CT abdomen and pelvis 05/18/2022 FINDINGS: Lower chest: There is atelectasis or scarring in the lingula. There is a 3 mm nodule in the left lower lobe. Hepatobiliary: No focal liver abnormality is seen. No gallstones, gallbladder wall thickening, or biliary dilatation. Pancreas: Unremarkable. No pancreatic ductal dilatation or surrounding inflammatory changes. Spleen: Mildly enlarged. Adrenals/Urinary Tract: There is mild bladder wall thickening. There is a small amount of air in the bladder. Kidneys and adrenal glands are within normal limits. Stomach/Bowel: There is a small hiatal hernia. Stomach is within normal limits. No evidence of bowel wall thickening, distention, or inflammatory changes. The appendix is not seen. 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 Aorta and IVC are normal in size. There is an enlarged left para-aortic lymph node measuring 11 mm image 3/50 which is new from prior. There also enlarged left iliac chain lymph nodes measuring up to 10  mm short axis, slightly increased from  prior. Reproductive: Prostate is unremarkable. Other: There is no ascites or focal abdominal wall hernia. Musculoskeletal: There is dislocation of the left hip posteriorly. 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 There is also ill-defined fluid surrounding the left femoral neck with multiple foci of air. There is also fluid and air seen within the left hip joint space. There is a decubitus ulcer extending to the level of the left ischium, posterior left inferior pubic ramus and neck of the femur. There is underlying erosion of the ischium and inferior pubic ramus. There is a healing fracture of the anterior and medial left acetabulum which appears new. IMPRESSION: 1. 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 Decubitus ulcer overlies this region. 2. There is a new healing fracture of the left acetabulum. 3. Posterior left hip dislocation appears chronic. 4. New left para-aortic and left iliac chain lymphadenopathy, likely reactive. 5. Mild splenomegaly. 6. Small amount of air in the bladder with bladder wall thickening. Correlate clinically for cystitis. 7. 3 mm left solid pulmonary nodule detected on incomplete chest CT. No follow-up needed if patient is low-risk.This recommendation follows the consensus statement: Guidelines for Management of Incidental Pulmonary Nodules Detected on CT Images: From the Fleischner Society 2017; Radiology 2017; 284:228-243. Electronically Signed   By: Darliss Cheney M.D.   On: 04/08/2023 22:21    Review of Systems  Constitutional:  Positive for fatigue. Negative for chills, diaphoresis and fever.  HENT:  Negative for ear discharge, ear pain, hearing loss and tinnitus.   Eyes:  Negative for photophobia and pain.  Respiratory:  Negative for cough and shortness of breath.   Cardiovascular:  Negative for chest pain.  Gastrointestinal:  Negative for abdominal  pain, nausea and vomiting.  Genitourinary:  Negative for dysuria, flank pain, frequency and urgency.  Musculoskeletal:  Negative for arthralgias, back pain, myalgias and neck pain.  Neurological:  Negative for dizziness and headaches.  Hematological:  Does not bruise/bleed easily.  Psychiatric/Behavioral:  The patient is not nervous/anxious.    Blood pressure 121/72, pulse 77, temperature 98.1 F (36.7 C), temperature source Oral, resp. rate 19, height 5\' 11"  (1.803 m), weight 72.6 kg, SpO2 100%. Physical Exam Constitutional:      General: He is not in acute distress.    Appearance: He is well-developed. He is not diaphoretic.  HENT:     Head: Normocephalic and atraumatic.  Eyes:     General: No scleral icterus.       Right eye: No discharge.        Left eye: No discharge.     Conjunctiva/sclera: Conjunctivae normal.  Cardiovascular:     Rate and Rhythm: Normal rate and regular rhythm.  Pulmonary:     Effort: Pulmonary effort is normal. No respiratory distress.  Musculoskeletal:     Cervical back: Normal range of motion.     Comments: LLE No traumatic wounds, ecchymosis. Multiple ulcers.  Large bilateral ischial decubiti  No knee or ankle effusion, no pain with PROM hip/knee  Knee stable to varus/ valgus and anterior/posterior stress  Sens DPN, SPN, TN mostly absent  Motor EHL, ext, flex, evers 0/5  DP 1+, PT 1+, No significant edema  Skin:    General: Skin is warm and dry.  Neurological:     Mental Status: He is alert.  Psychiatric:  Mood and Affect: Mood normal. Affect is blunt.        Behavior: Behavior is withdrawn.     Assessment/Plan: Chronic pelvic infection -- Will have Dr. Lajoyce Corners evaluate later today or in AM.     Freeman Caldron, PA-C Orthopedic Surgery [phone] 04/09/2023, 9:46 AM

## 2023-04-09 NOTE — Progress Notes (Signed)
eLink Physician-Brief Progress Note Patient Name: Steve [name] DOB: 1990-12-12 MRN: [id number]   Date of Service  04/09/2023  HPI/Events of Note  Latest hemoglobin 6.8.  Has received 2 units of transfusion during his admission thus far.  No evidence of bleeding-no intra-abdominal free fluid, no collections in his thigh/hematomas  eICU Interventions  Transfuse 1 unit PRBC Add on INR and fibrinogen for the morning.     Intervention Category Intermediate Interventions: Bleeding - evaluation and treatment with blood products  Paiden Cavell 04/09/2023, 8:35 PM

## 2023-04-10 DIAGNOSIS — L089 Local infection of the skin and subcutaneous tissue, unspecified: Secondary | ICD-10-CM | POA: Diagnosis not present

## 2023-04-10 DIAGNOSIS — L89150 Pressure ulcer of sacral region, unstageable: Secondary | ICD-10-CM | POA: Diagnosis not present

## 2023-04-10 DIAGNOSIS — R6521 Severe sepsis with septic shock: Secondary | ICD-10-CM | POA: Diagnosis not present

## 2023-04-10 DIAGNOSIS — A419 Sepsis, unspecified organism: Secondary | ICD-10-CM | POA: Diagnosis not present

## 2023-04-10 LAB — BPAM RBC
Blood Product Expiration Date: 202408182359
Unit Type and Rh: 6200

## 2023-04-10 LAB — TYPE AND SCREEN
Antibody Screen: NEGATIVE
Unit division: 0
Unit division: 0
Unit division: 0

## 2023-04-10 LAB — GLUCOSE, CAPILLARY
Glucose-Capillary: 93 mg/dL (ref 70–99)
Glucose-Capillary: 92 mg/dL (ref 70–99)
Glucose-Capillary: 122 mg/dL — ABNORMAL HIGH (ref 70–99)

## 2023-04-10 LAB — BASIC METABOLIC PANEL
Anion gap: 6 (ref 5–15)
BUN: <5 mg/dL — ABNORMAL LOW (ref 6–20)
CO2: 22 mmol/L (ref 22–32)
Calcium: 7.1 mg/dL — ABNORMAL LOW (ref 8.9–10.3)
Chloride: 100 mmol/L (ref 98–111)
Creatinine, Ser: 0.51 mg/dL — ABNORMAL LOW (ref 0.61–1.24)
GFR, Estimated: >60 mL/min (ref >60)
Glucose, Bld: 97 mg/dL (ref 70–99)
Potassium: 4.4 mmol/L (ref 3.5–5.1)
Sodium: 128 mmol/L — ABNORMAL LOW (ref 135–145)

## 2023-04-10 LAB — CBC WITH DIFFERENTIAL/PLATELET
Abs Immature Granulocytes: 0.05 10*3/uL (ref 0.00–0.07)
Basophils Absolute: 0 10*3/uL (ref 0.0–0.1)
Basophils Relative: 0 %
Eosinophils Absolute: 0 10*3/uL (ref 0.0–0.5)
Eosinophils Relative: 1 %
HCT: 24.5 % — ABNORMAL LOW (ref 39.0–52.0)
Hemoglobin: 8.1 g/dL — ABNORMAL LOW (ref 13.0–17.0)
Immature Granulocytes: 1 %
Lymphocytes Relative: 16 %
Lymphs Abs: 1.1 10*3/uL (ref 0.7–4.0)
MCH: 28.2 pg (ref 26.0–34.0)
MCHC: 33.1 g/dL (ref 30.0–36.0)
MCV: 85.4 fL (ref 80.0–100.0)
Monocytes Absolute: 0.2 10*3/uL (ref 0.1–1.0)
Monocytes Relative: 4 %
Neutro Abs: 5.2 10*3/uL (ref 1.7–7.7)
Neutrophils Relative %: 78 %
Platelets: 253 10*3/uL (ref 150–400)
RBC: 2.87 MIL/uL — ABNORMAL LOW (ref 4.22–5.81)
RDW: 16.8 % — ABNORMAL HIGH (ref 11.5–15.5)
WBC: 6.6 10*3/uL (ref 4.0–10.5)
nRBC: 0 % (ref 0.0–0.2)

## 2023-04-10 LAB — IRON AND TIBC
Iron: 15 ug/dL — ABNORMAL LOW (ref 45–182)
Saturation Ratios: 15 % — ABNORMAL LOW (ref 17.9–39.5)
TIBC: 99 ug/dL — ABNORMAL LOW (ref 250–450)
UIBC: 84 ug/dL

## 2023-04-10 LAB — PROTIME-INR
INR: 1.2 (ref 0.8–1.2)
Prothrombin Time: 15.7 seconds — ABNORMAL HIGH (ref 11.4–15.2)

## 2023-04-10 LAB — VANCOMYCIN, PEAK: Vancomycin Pk: 18 ug/mL — ABNORMAL LOW (ref 30–40)

## 2023-04-10 LAB — VITAMIN D 25 HYDROXY (VIT D DEFICIENCY, FRACTURES): Vit D, 25-Hydroxy: 23.23 ng/mL — ABNORMAL LOW (ref 30–100)

## 2023-04-10 LAB — FERRITIN: Ferritin: 795 ng/mL — ABNORMAL HIGH (ref 24–336)

## 2023-04-10 LAB — C-REACTIVE PROTEIN: CRP: 11 mg/dL — ABNORMAL HIGH (ref <1.0)

## 2023-04-10 LAB — URINE CULTURE: Culture: >=100000 — AB

## 2023-04-10 MED ORDER — ENSURE ENLIVE PO LIQD
237.0000 mL | Freq: Three times a day (TID) | ORAL | Status: DC
  Administered 2023-04-10 – 2023-05-03 (×58): 237 mL via ORAL

## 2023-04-10 MED ORDER — JUVEN PO PACK
1.0000 | PACK | Freq: Two times a day (BID) | ORAL | Status: DC
  Administered 2023-04-10 – 2023-05-03 (×28): 1 via ORAL
  Filled 2023-04-10 (×29): qty 1

## 2023-04-10 MED ORDER — ADULT MULTIVITAMIN W/MINERALS CH
1.0000 | ORAL_TABLET | Freq: Every day | ORAL | Status: DC
  Administered 2023-04-10 – 2023-05-03 (×23): 1 via ORAL
  Filled 2023-04-10 (×23): qty 1

## 2023-04-10 MED ORDER — SODIUM CHLORIDE 0.9 % IV SOLN
2.0000 g | INTRAVENOUS | Status: DC
  Administered 2023-04-10: 2 g via INTRAVENOUS
  Filled 2023-04-10: qty 20

## 2023-04-10 MED ORDER — ARIPIPRAZOLE 5 MG PO TABS
15.0000 mg | ORAL_TABLET | Freq: Every day | ORAL | Status: DC
  Administered 2023-04-10 – 2023-05-03 (×23): 15 mg via ORAL
  Filled 2023-04-10 (×24): qty 1

## 2023-04-10 NOTE — Progress Notes (Signed)
Initial Nutrition Assessment  DOCUMENTATION CODES:  Not applicable  INTERVENTION:  Continue current diet as ordered Ordering assistance Snacks from dining services BID Ensure Enlive po TID, each supplement provides 350 kcal and 20 grams of protein. 1 packet Juven BID, each packet provides 95 calories, 2.5 grams of protein (collagen), and 9.8 grams of carbohydrate (3 grams sugar); also contains 7 grams of L-arginine and L-glutamine, 300 mg vitamin C, 15 mg vitamin E, 1.2 mcg vitamin B-12, 9.5 mg zinc, 200 mg calcium, and 1.5 g  Calcium Beta-hydroxy-Beta-methylbutyrate to support wound healing MVI with minerals daily Assess for the following micronutrient deficiencies which may be contributing to poor wound healing: Vitamin A, Vitamin C, Zinc, Vitamin D, Vitamin E  NUTRITION DIAGNOSIS:   Inadequate oral intake related to decreased appetite as evidenced by per patient/family report.  GOAL:   Patient will meet greater than or equal to 90% of their needs  MONITOR:   PO intake, Supplement acceptance, I & O's, Labs  REASON FOR ASSESSMENT:   Rounds (significant wounds)    ASSESSMENT:  Pt with hx of paraplegia, chronic pelvic osteomyelitis, paranoid schizophrenia, drug abuse, and PTSD presented to ED with several days of abdominal pain and purulent drainage from wounds.Found to be in septic shock  Pt resting in bed at the time of assessment. States that he has not been eating well for several weeks PTA because he has not been feeling well. Does endorse weight loss but unsure how much. Pt states that he does like ensure and that he also likes sandwiches. Requests that one be ordered.   Discussed his increased needs with wound healing. Pt agreeable to all supplements and vitamins to encourage adequate nutrition and wound healing.    Intake/Output Summary (Last 24 hours) at 04/10/2023 1236 Last data filed at 04/10/2023 4098 Gross per 24 hour  Intake 3321.5 ml  Output 2675 ml  Net 646.5  ml  Net IO Since Admission: 4,592.81 mL [04/10/23 1236]  Average Meal Intake: 7/23: 62% intake x 3 recorded meals  Nutritionally Relevant Medications: Scheduled Meds:  pantoprazole IV  40 mg Intravenous Q12H   Continuous Infusions:  sodium chloride 100 mL/hr at 04/10/23 1191   norepinephrine (LEVOPHED) Adult infusion Stopped (04/09/23 2115)   vancomycin Stopped (04/10/23 0622)   PRN Meds: alum & mag hydroxide-simeth, baclofen, docusate sodium, polyethylene glycol  Labs Reviewed: Na 128 BUN <5, creatinine 0.51 CBG ranges from 92-122 mg/dL over the last 24 hours  NUTRITION - FOCUSED PHYSICAL EXAM: Flowsheet Row Most Recent Value  Orbital Region Mild depletion  Upper Arm Region No depletion  Thoracic and Lumbar Region No depletion  Buccal Region No depletion  Temple Region Mild depletion  Clavicle Bone Region No depletion  Clavicle and Acromion Bone Region Mild depletion  Scapular Bone Region No depletion  Dorsal Hand No depletion  Patellar Region Severe depletion  Anterior Thigh Region Severe depletion  Posterior Calf Region Severe depletion  Edema (RD Assessment) None  Hair Reviewed  Eyes Reviewed  Mouth Reviewed  Skin Reviewed  Nails Reviewed   Diet Order:   Diet Order             Diet regular Room service appropriate? Yes with Assist; Fluid consistency: Thin  Diet effective now                   EDUCATION NEEDS:  Education needs have been addressed  Skin:    Per WOC assessement 7/23: Full thickness wounds: - Right posterior knee 2  x 2 x 0.1cm - Right anterior knee 0.5 x 0.5 x 0.1cm and 5 x 3 x 0.2 cm - Right lower leg 3 x 2 x 0.2 cm - Left elbow 0.8 x 0.8 x 0.1 cm - Left posterior arm 4 x 2 x 0.1 cm - Left posterior knee 3 x 3 cm - Left anterior knee 7 x 7 x 0.2 cm - Left posterior calf - Left anterior foot 4 x 3 x 0.2 cm and 1.5 x 1.5 cm Unstageable Pressure Injury - Left outer ankle 4 x 4 cm  Stage 4 Pressure Injury - Left ischial  tuberosity (5 x 4 x 5 cm) - Right Ischial tuberosity  Last BM:  7/23  Height:  Ht Readings from Last 1 Encounters:  04/09/23 5\' 11"  (1.803 m)    Weight:  Wt Readings from Last 1 Encounters:  04/10/23 72.1 kg    Ideal Body Weight:  70.4 kg (adjusted by 10% for paralysis)  BMI:  Body mass index is 22.17 kg/m.  Estimated Nutritional Needs:  Kcal:  2200-2400 kcal/d Protein:  115-130 g/d Fluid:  >/=2.2L/d    Greig Castilla, RD, LDN Clinical Dietitian RD pager # available in [facility]  After hours/weekend pager # available in [facility]

## 2023-04-10 NOTE — Progress Notes (Signed)
PROGRESS NOTE    Steve WHACK  [name]:[id number] DOB: [birth year] DOA: 04/08/2023 PCP: [facility]   Brief Narrative:  32 year old male with past medical history as below which is significant for paraplegia, substance abuse issues, psychostimulant induced psychosis, neurogenic bladder, chronic hypotension on midodrine, chronic osteomyelitis, paranoid schizophrenia, and PTSD. He receives most of his medical care through the [place] where he was recently admitted for infections of bilateral ischial wounds.   Presents to the emergency department on 7/22 with complaints of weakness and malaise. Upon arrival arrival to the emergency department he was noted to have purulent drainage from his bilateral pressure wounds as well as abdominal pain. Imaging revealed concern for left hip septic arthritis/osteomyelitis.  Patient had hypotension refractory to fluids and admitted to the ICU with concerns for septic shock in the setting of septic arthritis/osteomyelitis of the left hip.   Assessment & Plan:   Principal Problem:   Septic shock (HCC) Active Problems:   MRSA bacteremia   Septic arthritis (HCC)   Pressure ulcer   Acute on chronic osteomyelitis septic arthritis of the left hip, septic shock, POA  Streptococcal bacteremia -Pain currently well-controlled, blood culture showing Streptococcus -ID following, vancomycin for osteomyelitis, ceftriaxone can be stopped for presumed UTI, questionable streptococcal bacteremia, TTE unremarkable, repeat cultures sent, no need for TEE at this time. -Orthopedics following, no indication for procedure per most recent note, [facility]   Acute on chronic normocytic anemia  -Complicated by low TIBC, iron within normal limits -Likely related to chronic illness given chronic osteomyelitis as well as potential blood loss anemia around wounds. -No dark stool melena hematemesis or bleeding noted -Status post 1 unit  PRBC   GERD -Resume home PPI   Hyponatremia Hypokalemia Hypomagnesemia Non-anion gap metabolic acidosis -Continue LR, follow repeat labs, replete electrolytes as necessary   Neurogenic bladder Paraplegia -Continue to follow clinically, no signs or symptoms of UTI at this time -Continue Foley   Schizophrenia Substance abuse  **Home med rec continues to be incomplete, records have been requested from the [place], will start Abilify at prior reported dose to ensure no lag in medications. -Self reports use of methamphetamines self-reports use of methamphetamines  DVT prophylaxis: SCDs Start: 04/09/23 0140   Code Status:   Code Status: Full Code  Family Communication: None  Status is: Inpatient  Dispo: The patient is from: Home              Anticipated d/c is to: To be determined              Anticipated d/c date is: 72+ hours              Patient currently not medically stable for discharge  Consultants:  PCCM, ID  Procedures:  None  Antimicrobials:  Vancomycin  Subjective: No acute issues or events overnight, patient request increase in narcotics for unspecified/generalized pain.  Otherwise somewhat poor historian, cannot recall if he is seeing any other providers today, cannot recall why he is here what his diagnosis is or what the plan moving forward is.  Objective: Vitals:   04/10/23 0600 04/10/23 0630 04/10/23 0719 04/10/23 0739  BP: 103/69 112/70  110/71  Pulse: 78 81  81  Resp: 19 20  (!) 22  Temp:   98.7 F (37.1 C)   TempSrc:   Axillary   SpO2: 98% 97%  95%  Weight:      Height:  Intake/Output Summary (Last 24 hours) at 04/10/2023 0750 Last data filed at 04/10/2023 0400 Gross per 24 hour  Intake 4319.83 ml  Output 3675 ml  Net 644.83 ml   Filed Weights   04/09/23 0200  Weight: 72.6 kg    Examination:  General: Cachectic, resting bed, no acute distress, attempting to read the menu for lunch. HENT: Normocephalic, atraumatic Lungs: Clear  to auscultation bilaterally, unlabored breathing on room air Cardiovascular: Regular rate and rhythm, no murmurs, rubs, or gallops Abdomen: Nontender, nondistended, foley catheter noted Extremities: Dressings noted to bilateral ischial wounds - clean/dry/intact Neuro: BUE strength 5/5; paralyzed from waist down Psych: Flat affect, mood  Data Reviewed: I have personally reviewed following labs and imaging studies  CBC: Recent Labs  Lab 04/08/23 2008 04/09/23 0637 04/09/23 1537 04/10/23 0650  WBC 10.6* 8.2  --  6.6  NEUTROABS 9.3*  --   --  5.2  HGB 6.3* 6.6* 6.8* 8.1*  HCT 20.8* 21.4* 21.0* 24.5*  MCV 87.0 87.7  --  85.4  PLT 371 307  --  253   Basic Metabolic Panel: Recent Labs  Lab 04/08/23 1822 04/09/23 0637  NA 129* 126*  K 3.6 3.2*  CL 97* 98  CO2 20* 18*  GLUCOSE 100* 123*  BUN 9 8  CREATININE 0.83 0.71  CALCIUM 7.6* 7.1*  MG  --  1.4*  PHOS  --  3.3   GFR: Estimated Creatinine Clearance: 134.9 mL/min (by C-G formula based on SCr of 0.71 mg/dL). Liver Function Tests: Recent Labs  Lab 04/08/23 1822  AST 23  ALT 14  ALKPHOS 98  BILITOT 0.4  PROT 7.2  ALBUMIN <1.5*   No results for input(s): "LIPASE", "AMYLASE" in the last 168 hours. No results for input(s): "AMMONIA" in the last 168 hours. Coagulation Profile: Recent Labs  Lab 04/08/23 1822 04/10/23 0650  INR 1.3* 1.2   Cardiac Enzymes: No results for input(s): "CKTOTAL", "CKMB", "CKMBINDEX", "TROPONINI" in the last 168 hours. BNP (last 3 results) No results for input(s): "PROBNP" in the last 8760 hours. HbA1C: No results for input(s): "HGBA1C" in the last 72 hours. CBG: Recent Labs  Lab 04/09/23 1521 04/09/23 1929 04/09/23 2322 04/10/23 0313 04/10/23 0715  GLUCAP 129* 114* 109* 93 92   Lipid Profile: No results for input(s): "CHOL", "HDL", "LDLCALC", "TRIG", "CHOLHDL", "LDLDIRECT" in the last 72 hours. Thyroid Function Tests: No results for input(s): "TSH", "T4TOTAL", "FREET4",  "T3FREE", "THYROIDAB" in the last 72 hours. Anemia Panel: No results for input(s): "VITAMINB12", "FOLATE", "FERRITIN", "TIBC", "IRON", "RETICCTPCT" in the last 72 hours. Sepsis Labs: Recent Labs  Lab 04/08/23 1828 04/08/23 2008 04/08/23 2219  LATICACIDVEN 2.8* 2.4* 2.1*    Recent Results (from the past 240 hour(s))  Culture, blood (Routine x 2)     Status: None (Preliminary result)   Collection Time: 04/08/23  6:22 PM   Specimen: BLOOD  Result Value Ref Range Status   Specimen Description BLOOD SITE NOT SPECIFIED  Final   Special Requests   Final    BOTTLES DRAWN AEROBIC ONLY Blood Culture results may not be optimal due to an inadequate volume of blood received in culture bottles   Culture   Final    NO GROWTH < 24 HOURS Performed at [facility], [address] [place], [place] [address]    Report Status PENDING  Incomplete  Culture, blood (Routine x 2)     Status: None (Preliminary result)   Collection Time: 04/08/23  8:08 PM  Specimen: BLOOD  Result Value Ref Range Status   Specimen Description BLOOD SITE NOT SPECIFIED  Final   Special Requests   Final    BOTTLES DRAWN AEROBIC AND ANAEROBIC Blood Culture adequate volume   Culture  Setup Time   Final    GRAM POSITIVE COCCI IN CHAINS IN BOTH AEROBIC AND ANAEROBIC BOTTLES CRITICAL RESULT CALLED TO, READ BACK BY AND VERIFIED WITH: E. SINCLAIR PHARMD, AT 0940 D. VANHOOK GRAM POSITIVE COCCI IN CLUSTERS AEROBIC BOTTLE ONLY CRITICAL RESULT CALLED TO, READ BACK BY AND VERIFIED WITH: PHARMD EMILY SMarland Kitchen [phone] FCP Performed at [facility], [address], [place], [place] [address]    Culture GRAM POSITIVE COCCI  Final   Report Status PENDING  Incomplete  Blood Culture ID Panel (Reflexed)     Status: Abnormal   Collection Time: 04/08/23  8:08 PM  Result Value Ref Range Status   Enterococcus faecalis NOT DETECTED NOT DETECTED Final   Enterococcus Faecium NOT DETECTED NOT DETECTED Final   Listeria monocytogenes  NOT DETECTED NOT DETECTED Final   Staphylococcus species NOT DETECTED NOT DETECTED Final   Staphylococcus aureus (BCID) NOT DETECTED NOT DETECTED Final   Staphylococcus epidermidis NOT DETECTED NOT DETECTED Final   Staphylococcus lugdunensis NOT DETECTED NOT DETECTED Final   Streptococcus species DETECTED (A) NOT DETECTED Final    Comment: Not Enterococcus species, Streptococcus agalactiae, Streptococcus pyogenes, or Streptococcus pneumoniae. CRITICAL RESULT CALLED TO, READ BACK BY AND VERIFIED WITH: E. SINCLAIR PHARMD, AT 0940 D. VANHOOK    Streptococcus agalactiae NOT DETECTED NOT DETECTED Final   Streptococcus pneumoniae NOT DETECTED NOT DETECTED Final   Streptococcus pyogenes NOT DETECTED NOT DETECTED Final   A.calcoaceticus-baumannii NOT DETECTED NOT DETECTED Final   Bacteroides fragilis NOT DETECTED NOT DETECTED Final   Enterobacterales NOT DETECTED NOT DETECTED Final   Enterobacter cloacae complex NOT DETECTED NOT DETECTED Final   Escherichia coli NOT DETECTED NOT DETECTED Final   Klebsiella aerogenes NOT DETECTED NOT DETECTED Final   Klebsiella oxytoca NOT DETECTED NOT DETECTED Final   Klebsiella pneumoniae NOT DETECTED NOT DETECTED Final   Proteus species NOT DETECTED NOT DETECTED Final   Salmonella species NOT DETECTED NOT DETECTED Final   Serratia marcescens NOT DETECTED NOT DETECTED Final   Haemophilus influenzae NOT DETECTED NOT DETECTED Final   Neisseria meningitidis NOT DETECTED NOT DETECTED Final   Pseudomonas aeruginosa NOT DETECTED NOT DETECTED Final   Stenotrophomonas maltophilia NOT DETECTED NOT DETECTED Final   Candida albicans NOT DETECTED NOT DETECTED Final   Candida auris NOT DETECTED NOT DETECTED Final   Candida glabrata NOT DETECTED NOT DETECTED Final   Candida krusei NOT DETECTED NOT DETECTED Final   Candida parapsilosis NOT DETECTED NOT DETECTED Final   Candida tropicalis NOT DETECTED NOT DETECTED Final   Cryptococcus neoformans/gattii NOT DETECTED NOT  DETECTED Final    Comment: Performed at [facility], [address], [place], [place] [address]  Blood Culture ID Panel (Reflexed)     Status: Abnormal   Collection Time: 04/08/23  8:08 PM  Result Value Ref Range Status   Enterococcus faecalis NOT DETECTED NOT DETECTED Final   Enterococcus Faecium NOT DETECTED NOT DETECTED Final   Listeria monocytogenes NOT DETECTED NOT DETECTED Final   Staphylococcus species DETECTED (A) NOT DETECTED Final    Comment: CRITICAL RESULT CALLED TO, READ BACK BY AND VERIFIED WITH: PHARMD EMILY S. [phone] FCP    Staphylococcus aureus (BCID) DETECTED (A) NOT DETECTED Final  Comment: Methicillin (oxacillin)-resistant Staphylococcus aureus (MRSA). MRSA is predictably resistant to beta-lactam antibiotics (except ceftaroline). Preferred therapy is vancomycin unless clinically contraindicated. Patient requires contact precautions if  hospitalized. CRITICAL RESULT CALLED TO, READ BACK BY AND VERIFIED WITH: PHARMD EMILY S. [phone] FCP    Staphylococcus epidermidis NOT DETECTED NOT DETECTED Final   Staphylococcus lugdunensis NOT DETECTED NOT DETECTED Final   Streptococcus species DETECTED (A) NOT DETECTED Final    Comment: Not Enterococcus species, Streptococcus agalactiae, Streptococcus pyogenes, or Streptococcus pneumoniae. CRITICAL RESULT CALLED TO, READ BACK BY AND VERIFIED WITH: PHARMD EMILY S. [phone] FCP    Streptococcus agalactiae NOT DETECTED NOT DETECTED Final   Streptococcus pneumoniae NOT DETECTED NOT DETECTED Final   Streptococcus pyogenes NOT DETECTED NOT DETECTED Final   A.calcoaceticus-baumannii NOT DETECTED NOT DETECTED Final   Bacteroides fragilis NOT DETECTED NOT DETECTED Final   Enterobacterales NOT DETECTED NOT DETECTED Final   Enterobacter cloacae complex NOT DETECTED NOT DETECTED Final   Escherichia coli NOT DETECTED NOT DETECTED Final   Klebsiella aerogenes NOT DETECTED NOT DETECTED Final   Klebsiella oxytoca NOT  DETECTED NOT DETECTED Final   Klebsiella pneumoniae NOT DETECTED NOT DETECTED Final   Proteus species NOT DETECTED NOT DETECTED Final   Salmonella species NOT DETECTED NOT DETECTED Final   Serratia marcescens NOT DETECTED NOT DETECTED Final   Haemophilus influenzae NOT DETECTED NOT DETECTED Final   Neisseria meningitidis NOT DETECTED NOT DETECTED Final   Pseudomonas aeruginosa NOT DETECTED NOT DETECTED Final   Stenotrophomonas maltophilia NOT DETECTED NOT DETECTED Final   Candida albicans NOT DETECTED NOT DETECTED Final   Candida auris NOT DETECTED NOT DETECTED Final   Candida glabrata NOT DETECTED NOT DETECTED Final   Candida krusei NOT DETECTED NOT DETECTED Final   Candida parapsilosis NOT DETECTED NOT DETECTED Final   Candida tropicalis NOT DETECTED NOT DETECTED Final   Cryptococcus neoformans/gattii NOT DETECTED NOT DETECTED Final   Meth resistant mecA/C and MREJ DETECTED (A) NOT DETECTED Final    Comment: CRITICAL RESULT CALLED TO, READ BACK BY AND VERIFIED WITH: PHARMD EMILY S. [phone] FCP Performed at [facility], [address], [place], [place] [address]          Radiology Studies: ECHOCARDIOGRAM COMPLETE  Result Date: 04/09/2023    ECHOCARDIOGRAM REPORT   Patient Name:   KEYLAN COSTABILE [facility] Date of Exam: 04/09/2023 Medical Rec #:  [id number]       Height:       71.0 in Accession #:    [id number]      Weight:       160.0 lb Date of Birth:  [birth year]       BSA:          1.918 m Patient Age:    32 years        BP:           114/66 mmHg Patient Gender: M               HR:           79 bpm. Exam Location:  Inpatient Procedure: 2D Echo, Cardiac Doppler and Color Doppler Indications:    Shock R57.9  History:        Patient has prior history of Echocardiogram examinations, most                 recent 06/25/2022. Arrythmias:LBBB; Risk Factors:Current Smoker.  Sonographer:    Lucendia Herrlich Referring Phys: [id number] Clarene Critchley [facility]  Sonographer Comments: Image acquisition challenging  due to patient body habitus. IMPRESSIONS  1. Left ventricular ejection fraction, by estimation, is 60 to 65%. Left ventricular ejection fraction by 3D volume is 64 %. The left ventricle has normal function. The left ventricle has no regional wall motion abnormalities. Left ventricular diastolic  parameters were normal.  2. Right ventricular systolic function is normal. The right ventricular size is normal. Tricuspid regurgitation signal is inadequate for assessing PA pressure.  3. The mitral valve is normal in structure. No evidence of mitral valve regurgitation. No evidence of mitral stenosis.  4. The aortic valve is normal in structure. Aortic valve regurgitation is not visualized. No aortic stenosis is present.  5. The inferior vena cava is normal in size with greater than 50% respiratory variability, suggesting right atrial pressure of 3 mmHg. Comparison(s): No significant change from prior study. Prior images reviewed side by side. FINDINGS  Left Ventricle: Left ventricular ejection fraction, by estimation, is 60 to 65%. Left ventricular ejection fraction by 3D volume is 64 %. The left ventricle has normal function. The left ventricle has no regional wall motion abnormalities. The left ventricular internal cavity size was normal in size. There is no left ventricular hypertrophy. Left ventricular diastolic parameters were normal. Normal left ventricular filling pressure. Right Ventricle: The right ventricular size is normal. No increase in right ventricular wall thickness. Right ventricular systolic function is normal. Tricuspid regurgitation signal is inadequate for assessing PA pressure. Left Atrium: Left atrial size was normal in size. Right Atrium: Right atrial size was normal in size. Pericardium: There is no evidence of pericardial effusion. Mitral Valve: The mitral valve is normal in structure. No evidence of mitral valve regurgitation. No evidence of mitral valve stenosis. Tricuspid Valve: The tricuspid  valve is normal in structure. Tricuspid valve regurgitation is not demonstrated. No evidence of tricuspid stenosis. Aortic Valve: The aortic valve is normal in structure. Aortic valve regurgitation is not visualized. No aortic stenosis is present. Aortic valve peak gradient measures 7.2 mmHg. Pulmonic Valve: The pulmonic valve was normal in structure. Pulmonic valve regurgitation is not visualized. No evidence of pulmonic stenosis. Aorta: The aortic root is normal in size and structure. Venous: The inferior vena cava was not well visualized. The inferior vena cava is normal in size with greater than 50% respiratory variability, suggesting right atrial pressure of 3 mmHg. IAS/Shunts: No atrial level shunt detected by color flow Doppler.  LEFT VENTRICLE PLAX 2D LVIDd:         4.50 cm         Diastology LVIDs:         2.90 cm         LV e' medial:    9.95 cm/s LV PW:         1.00 cm         LV E/e' medial:  8.4 LV IVS:        0.90 cm         LV e' lateral:   16.00 cm/s LVOT diam:     2.70 cm         LV E/e' lateral: 5.2 LV SV:         93 LV SV Index:   49 LVOT Area:     5.73 cm        3D Volume EF  LV 3D EF:    Left                                             ventricul                                             ar                                             ejection                                             fraction                                             by 3D                                             volume is                                             64 %.                                 3D Volume EF:                                3D EF:        64 %                                LV EDV:       179 ml                                LV ESV:       65 ml                                LV SV:        114 ml RIGHT VENTRICLE RV S prime:     25.30 cm/s TAPSE (M-mode): 2.3 cm LEFT ATRIUM           Index        RIGHT ATRIUM           Index LA diam:      4.20 cm 2.19 cm/m   RA Area:      13.60 cm LA Vol (A2C): 53.6  ml 27.95 ml/m  RA Volume:   32.30 ml  16.84 ml/m LA Vol (A4C): 77.4 ml 40.36 ml/m  AORTIC VALVE                 PULMONIC VALVE AV Area (Vmax): 4.05 cm     PR End Diast Vel: 2.55 msec AV Vmax:        134.00 cm/s AV Peak Grad:   7.2 mmHg LVOT Vmax:      94.70 cm/s LVOT Vmean:     62.900 cm/s LVOT VTI:       0.163 m  AORTA Ao Root diam: 3.50 cm Ao Asc diam:  3.00 cm MITRAL VALVE MV Area (PHT): 3.31 cm    SHUNTS MV Decel Time: 229 msec    Systemic VTI:  0.16 m MV E velocity: 83.40 cm/s  Systemic Diam: 2.70 cm MV A velocity: 41.70 cm/s MV E/A ratio:  2.00 Mihai Croitoru MD Electronically signed by Thurmon Fair MD Signature Date/Time: 04/09/2023/1:26:13 PM    Final    CT ABDOMEN PELVIS W CONTRAST  Result Date: 04/08/2023 CLINICAL DATA:  Acute abdominal pain.  Multiple pelvic wounds. EXAM: CT ABDOMEN AND PELVIS WITH CONTRAST TECHNIQUE: Multidetector CT imaging of the abdomen and pelvis was performed using the standard protocol following bolus administration of intravenous contrast. RADIATION DOSE REDUCTION: This exam was performed according to the departmental dose-optimization program which includes automated exposure control, adjustment of the mA and/or kV according to patient size and/or use of iterative reconstruction technique. CONTRAST:  75mL OMNIPAQUE IOHEXOL 350 MG/ML SOLN COMPARISON:  CT abdomen and pelvis 05/18/2022 FINDINGS: Lower chest: There is atelectasis or scarring in the lingula. There is a 3 mm nodule in the left lower lobe. Hepatobiliary: No focal liver abnormality is seen. No gallstones, gallbladder wall thickening, or biliary dilatation. Pancreas: Unremarkable. No pancreatic ductal dilatation or surrounding inflammatory changes. Spleen: Mildly enlarged. Adrenals/Urinary Tract: There is mild bladder wall thickening. There is a small amount of air in the bladder. Kidneys and adrenal glands are within normal limits. Stomach/Bowel: There is a small hiatal hernia.  Stomach is within normal limits. No evidence of bowel wall thickening, distention, or inflammatory changes. The appendix is not seen. 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 Aorta and IVC are normal in size. There is an enlarged left para-aortic lymph node measuring 11 mm image 3/50 which is new from prior. There also enlarged left iliac chain lymph nodes measuring up to 10 mm short axis, slightly increased from prior. Reproductive: Prostate is unremarkable. Other: There is no ascites or focal abdominal wall hernia. Musculoskeletal: There is dislocation of the left hip posteriorly. 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 There is also ill-defined fluid surrounding the left femoral neck with multiple foci of air. There is also fluid and air seen within the left hip joint space. There is a decubitus ulcer extending to the level of the left ischium, posterior left inferior pubic ramus and neck of the femur. There is underlying erosion of the ischium and inferior pubic ramus. There is a healing fracture of the anterior and medial left acetabulum which appears new. IMPRESSION: 1. 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 Decubitus ulcer overlies this region. 2. There is a new healing fracture of the left acetabulum. 3. Posterior left hip dislocation appears chronic. 4. New left para-aortic and left iliac chain lymphadenopathy, likely reactive.  5. Mild splenomegaly. 6. Small amount of air in the bladder with bladder wall thickening. Correlate clinically for cystitis. 7. 3 mm left solid pulmonary nodule detected on incomplete chest CT. No follow-up needed if patient is low-risk.This recommendation follows the consensus statement: Guidelines for Management of Incidental Pulmonary Nodules Detected on CT Images: From the Fleischner Society 2017; Radiology 2017;  284:228-243. Electronically Signed   By: Darliss Cheney M.D.   On: 04/08/2023 22:21     Scheduled Meds:  Chlorhexidine Gluconate Cloth  6 each Topical Daily   leptospermum manuka honey  1 Application Topical Daily   midodrine  10 mg Oral Q8H   pantoprazole (PROTONIX) IV  40 mg Intravenous Q12H   Continuous Infusions:  sodium chloride 100 mL/hr at 04/10/23 0400   norepinephrine (LEVOPHED) Adult infusion Stopped (04/09/23 2115)   piperacillin-tazobactam (ZOSYN)  IV 3.375 g (04/10/23 0524)   vancomycin 1,000 mg (04/10/23 0529)     LOS: 1 day   Time spent:  Azucena Fallen, DO [facility]  If 7PM-7AM, please contact night-coverage [email or web address]  04/10/2023, 7:50 AM

## 2023-04-10 NOTE — Consult Note (Signed)
ORTHOPAEDIC CONSULTATION  REQUESTING PHYSICIAN: Azucena Fallen, MD  Chief Complaint: Chronic bilateral hip and sacral ulcers.  HPI: Steve [name] is a 32 y.o. male who presents with paraplegia and chronic hip and sacral ulcers.  Patient states that he and his wife tried to care for him at home and are unsuccessful with their care.  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   Anxiety    Anxiety and depression 11/12/2015   Chronic osteomyelitis, pelvis, left (HCC) 05/30/2021   Paranoid schizophrenia (HCC)    Paraplegia (HCC)    PTSD (post-traumatic stress disorder) Paranoid   Past Surgical History:  Procedure Laterality Date   banding procedure for morbid obesity     I & D EXTREMITY Right 02/27/2014   Procedure: IRRIGATION AND DEBRIDEMENT FOREARM AND REPAIR OF 30cm LACERATION;  Surgeon: Eulas Post, MD;  Location: [facility] OR;  Service: Orthopedics;  Laterality: Right;  Anesthesia Regional with MAC   TEE WITHOUT CARDIOVERSION N/A 08/01/2021   Procedure: TRANSESOPHAGEAL ECHOCARDIOGRAM (TEE);  Surgeon: Chrystie Nose, MD;  Location: [facility] ENDOSCOPY;  Service: Cardiovascular;  Laterality: N/A;   TEE WITHOUT CARDIOVERSION N/A 06/28/2022   Procedure: TRANSESOPHAGEAL ECHOCARDIOGRAM (TEE);  Surgeon: Chrystie Nose, MD;  Location: [facility] ENDOSCOPY;  Service: Cardiovascular;  Laterality: N/A;   Social History   Socioeconomic History   Marital status: Married    Spouse name: Not on file   Number of children: Not on file   Years of education: Not on file   Highest education level: Not on file  Occupational History   Not on file  Tobacco Use   Smoking status: Some Days    Current packs/day: 0.25    Types: Cigarettes   Smokeless tobacco: Never  Vaping Use   Vaping status: Unknown  Substance and Sexual Activity   Alcohol use: Not Currently   Drug use: Yes    Types: Marijuana, Methamphetamines    Sexual activity: Not Currently  Other Topics Concern   Not on file  Social History Narrative   Not on file   Social Determinants of Health   Financial Resource Strain: Not on file  Food Insecurity: No Food Insecurity (06/25/2022)   Hunger Vital Sign    Worried About Running Out of Food in the Last Year: Never true    Ran Out of Food in the Last Year: Never true  Transportation Needs: No Transportation Needs (06/25/2022)   PRAPARE - Administrator, Civil Service (Medical): No    Lack of Transportation (Non-Medical): No  Physical Activity: Not on file  Stress: Not on file  Social Connections: Not on file   Family History  Problem Relation Age of Onset   Depression Mother    Bipolar disorder Father    - negative except otherwise stated in the family history section Allergies  Allergen Reactions   Caffeine Anaphylaxis   Chocolate Anaphylaxis   Bactrim [Sulfamethoxazole-Trimethoprim] Other (See Comments)    Reaction not listed.   Tuberculin, Ppd     Other reaction(s): Eruption of skin   Sulfa Antibiotics Rash   Prior to Admission medications   Medication Sig Start Date End Date Taking? Authorizing Provider  Benzalkonium Chloride (REMEDY ANTIMICROBIAL CLEANSER EX) Apply 1 Application topically daily.   Yes [provider]  moxifloxacin (AVELOX) 400 MG tablet Take 400 mg by mouth daily at 8 pm. For skin infection   Yes [provider]  Nutritional  Supplements (ENSURE ORIGINAL) LIQD Take 1 Can by mouth 3 (three) times daily between meals.   Yes [provider]  acetaminophen (TYLENOL) 325 MG tablet Take 650 mg by mouth 2 (two) times daily as needed for mild pain. 03/16/22   [provider]  ARIPiprazole (ABILIFY) 30 MG tablet Take 15 mg by mouth daily. 03/16/22   [provider]  Calcium Carbonate 500 MG CHEW Chew 500 mg by mouth 2 (two) times daily as needed (gerd). 03/16/22   [provider]  midodrine (PROAMATINE) 5  MG tablet Take 5 mg by mouth 2 (two) times daily as needed (dizziness when sitting up). 03/16/22   [provider]  omeprazole (PRILOSEC) 40 MG capsule Take 40 mg by mouth daily.    [provider]  pregabalin (LYRICA) 150 MG capsule Take 150 mg by mouth 2 (two) times daily.    [provider]  sertraline (ZOLOFT) 50 MG tablet Take 25 mg by mouth daily. 03/16/22   [provider]  tiZANidine (ZANAFLEX) 4 MG tablet Take 4 mg by mouth at bedtime as needed for muscle spasms. 03/16/22   [provider]  traZODone (DESYREL) 50 MG tablet Take 50 mg by mouth at bedtime. 03/16/22   [provider]  trospium (SANCTURA) 20 MG tablet Take 20 mg by mouth 2 (two) times daily.    [provider]  prazosin (MINIPRESS) 2 MG capsule Take 1 capsule (2 mg total) by mouth at bedtime. Patient not taking: Reported on 09/03/2017 11/04/16 09/14/19  Kristeen Mans, NP   ECHOCARDIOGRAM COMPLETE  Result Date: 04/09/2023    ECHOCARDIOGRAM REPORT   Patient Name:   Steve [name] [facility] Date of Exam: 04/09/2023 Medical Rec #:  [id number]       Height:       71.0 in Accession #:    [id number]      Weight:       160.0 lb Date of Birth:  1991/01/09       BSA:          1.918 m Patient Age:    32 years        BP:           114/66 mmHg Patient Gender: M               HR:           79 bpm. Exam Location:  Inpatient Procedure: 2D Echo, Cardiac Doppler and Color Doppler Indications:    Shock R57.9  History:        Patient has prior history of Echocardiogram examinations, most                 recent 06/25/2022. Arrythmias:LBBB; Risk Factors:Current Smoker.  Sonographer:    Lucendia Herrlich Referring Phys: [id number] Clarene Critchley HOFFMAN  Sonographer Comments: Image acquisition challenging due to patient body habitus. IMPRESSIONS  1. Left ventricular ejection fraction, by estimation, is 60 to 65%. Left ventricular ejection fraction by 3D volume is 64 %. The left ventricle has normal function. The left  ventricle has no regional wall motion abnormalities. Left ventricular diastolic  parameters were normal.  2. Right ventricular systolic function is normal. The right ventricular size is normal. Tricuspid regurgitation signal is inadequate for assessing PA pressure.  3. The mitral valve is normal in structure. No evidence of mitral valve regurgitation. No evidence of mitral stenosis.  4. The aortic valve is normal in structure. Aortic valve regurgitation is not visualized. No aortic  stenosis is present.  5. The inferior vena cava is normal in size with greater than 50% respiratory variability, suggesting right atrial pressure of 3 mmHg. Comparison(s): No significant change from prior study. Prior images reviewed side by side. FINDINGS  Left Ventricle: Left ventricular ejection fraction, by estimation, is 60 to 65%. Left ventricular ejection fraction by 3D volume is 64 %. The left ventricle has normal function. The left ventricle has no regional wall motion abnormalities. The left ventricular internal cavity size was normal in size. There is no left ventricular hypertrophy. Left ventricular diastolic parameters were normal. Normal left ventricular filling pressure. Right Ventricle: The right ventricular size is normal. No increase in right ventricular wall thickness. Right ventricular systolic function is normal. Tricuspid regurgitation signal is inadequate for assessing PA pressure. Left Atrium: Left atrial size was normal in size. Right Atrium: Right atrial size was normal in size. Pericardium: There is no evidence of pericardial effusion. Mitral Valve: The mitral valve is normal in structure. No evidence of mitral valve regurgitation. No evidence of mitral valve stenosis. Tricuspid Valve: The tricuspid valve is normal in structure. Tricuspid valve regurgitation is not demonstrated. No evidence of tricuspid stenosis. Aortic Valve: The aortic valve is normal in structure. Aortic valve regurgitation is not visualized.  No aortic stenosis is present. Aortic valve peak gradient measures 7.2 mmHg. Pulmonic Valve: The pulmonic valve was normal in structure. Pulmonic valve regurgitation is not visualized. No evidence of pulmonic stenosis. Aorta: The aortic root is normal in size and structure. Venous: The inferior vena cava was not well visualized. The inferior vena cava is normal in size with greater than 50% respiratory variability, suggesting right atrial pressure of 3 mmHg. IAS/Shunts: No atrial level shunt detected by color flow Doppler.  LEFT VENTRICLE PLAX 2D LVIDd:         4.50 cm         Diastology LVIDs:         2.90 cm         LV e' medial:    9.95 cm/s LV PW:         1.00 cm         LV E/e' medial:  8.4 LV IVS:        0.90 cm         LV e' lateral:   16.00 cm/s LVOT diam:     2.70 cm         LV E/e' lateral: 5.2 LV SV:         93 LV SV Index:   49 LVOT Area:     5.73 cm        3D Volume EF                                LV 3D EF:    Left                                             ventricul                                             ar  ejection                                             fraction                                             by 3D                                             volume is                                             64 %.                                 3D Volume EF:                                3D EF:        64 %                                LV EDV:       179 ml                                LV ESV:       65 ml                                LV SV:        114 ml RIGHT VENTRICLE RV S prime:     25.30 cm/s TAPSE (M-mode): 2.3 cm LEFT ATRIUM           Index        RIGHT ATRIUM           Index LA diam:      4.20 cm 2.19 cm/m   RA Area:     13.60 cm LA Vol (A2C): 53.6 ml 27.95 ml/m  RA Volume:   32.30 ml  16.84 ml/m LA Vol (A4C): 77.4 ml 40.36 ml/m  AORTIC VALVE                 PULMONIC VALVE AV Area (Vmax): 4.05 cm     PR End Diast Vel: 2.55  msec AV Vmax:        134.00 cm/s AV Peak Grad:   7.2 mmHg LVOT Vmax:      94.70 cm/s LVOT Vmean:     62.900 cm/s LVOT VTI:       0.163 m  AORTA Ao Root diam: 3.50 cm Ao Asc diam:  3.00 cm MITRAL VALVE MV Area (PHT): 3.31 cm    SHUNTS MV Decel Time: 229 msec    Systemic VTI:  0.16 m MV E velocity:  83.40 cm/s  Systemic Diam: 2.70 cm MV A velocity: 41.70 cm/s MV E/A ratio:  2.00 Mihai Croitoru MD Electronically signed by Thurmon Fair MD Signature Date/Time: 04/09/2023/1:26:13 PM    Final    CT ABDOMEN PELVIS W CONTRAST  Result Date: 04/08/2023 CLINICAL DATA:  Acute abdominal pain.  Multiple pelvic wounds. EXAM: CT ABDOMEN AND PELVIS WITH CONTRAST TECHNIQUE: Multidetector CT imaging of the abdomen and pelvis was performed using the standard protocol following bolus administration of intravenous contrast. RADIATION DOSE REDUCTION: This exam was performed according to the departmental dose-optimization program which includes automated exposure control, adjustment of the mA and/or kV according to patient size and/or use of iterative reconstruction technique. CONTRAST:  75mL OMNIPAQUE IOHEXOL 350 MG/ML SOLN COMPARISON:  CT abdomen and pelvis 05/18/2022 FINDINGS: Lower chest: There is atelectasis or scarring in the lingula. There is a 3 mm nodule in the left lower lobe. Hepatobiliary: No focal liver abnormality is seen. No gallstones, gallbladder wall thickening, or biliary dilatation. Pancreas: Unremarkable. No pancreatic ductal dilatation or surrounding inflammatory changes. Spleen: Mildly enlarged. Adrenals/Urinary Tract: There is mild bladder wall thickening. There is a small amount of air in the bladder. Kidneys and adrenal glands are within normal limits. Stomach/Bowel: There is a small hiatal hernia. Stomach is within normal limits. No evidence of bowel wall thickening, distention, or inflammatory changes. The appendix is not seen. 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 Aorta  and IVC are normal in size. There is an enlarged left para-aortic lymph node measuring 11 mm image 3/50 which is new from prior. There also enlarged left iliac chain lymph nodes measuring up to 10 mm short axis, slightly increased from prior. Reproductive: Prostate is unremarkable. Other: There is no ascites or focal abdominal wall hernia. Musculoskeletal: There is dislocation of the left hip posteriorly. 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 There is also ill-defined fluid surrounding the left femoral neck with multiple foci of air. There is also fluid and air seen within the left hip joint space. There is a decubitus ulcer extending to the level of the left ischium, posterior left inferior pubic ramus and neck of the femur. There is underlying erosion of the ischium and inferior pubic ramus. There is a healing fracture of the anterior and medial left acetabulum which appears new. IMPRESSION: 1. 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 Decubitus ulcer overlies this region. 2. There is a new healing fracture of the left acetabulum. 3. Posterior left hip dislocation appears chronic. 4. New left para-aortic and left iliac chain lymphadenopathy, likely reactive. 5. Mild splenomegaly. 6. Small amount of air in the bladder with bladder wall thickening. Correlate clinically for cystitis. 7. 3 mm left solid pulmonary nodule detected on incomplete chest CT. No follow-up needed if patient is low-risk.This recommendation follows the consensus statement: Guidelines for Management of Incidental Pulmonary Nodules Detected on CT Images: From the Fleischner Society 2017; Radiology 2017; 284:228-243. Electronically Signed   By: Darliss Cheney M.D.   On: 04/08/2023 22:21   - pertinent xrays, CT, MRI studies were reviewed and independently interpreted  Positive ROS: All other systems have been reviewed and  were otherwise negative with the exception of those mentioned in the HPI and as above.  Physical Exam: General: Alert, no acute distress Psychiatric: Patient is competent for consent with normal mood and affect Lymphatic: No axillary or cervical lymphadenopathy Cardiovascular:  No pedal edema Respiratory: No cyanosis, no use of accessory musculature GI: No organomegaly, abdomen is soft and non-tender    Images:  @ENCIMAGES @  Labs:  Lab Results  Component Value Date   HGBA1C 4.6 (L) 01/05/2022   HGBA1C 5.1 02/02/2021   ESRSEDRATE >140 (H) 04/08/2023   ESRSEDRATE 72 (H) 01/02/2022   ESRSEDRATE 65 (H) 07/31/2021   CRP 13.8 (H) 04/08/2023   CRP 3.4 (H) 01/02/2022   CRP 0.7 07/31/2021   REPTSTATUS PENDING 04/09/2023   GRAMSTAIN  05/30/2021    FEW SQUAMOUS EPITHELIAL CELLS PRESENT FEW WBC SEEN MODERATE GRAM NEGATIVE RODS FEW GRAM POSITIVE COCCI Performed at [facility], [address] [place], [place] [address]    CULT (A) 04/09/2023    >=100,000 COLONIES/mL PROTEUS MIRABILIS SUSCEPTIBILITIES TO FOLLOW Performed at [facility], [address] [place], [place] [address]    LABORGA METHICILLIN RESISTANT STAPHYLOCOCCUS AUREUS 06/23/2022   LABORGA ESCHERICHIA COLI (A) 06/23/2022    Lab Results  Component Value Date   ALBUMIN <1.5 (L) 04/08/2023   ALBUMIN 2.0 (L) 06/27/2022   ALBUMIN 2.0 (L) 06/26/2022        Latest Ref Rng & Units 04/10/2023    6:50 AM 04/09/2023    3:37 PM 04/09/2023    6:37 AM  CBC EXTENDED  WBC 4.0 - 10.5 K/uL 6.6   8.2   RBC 4.22 - 5.81 MIL/uL 2.87   2.44   Hemoglobin 13.0 - 17.0 g/dL 8.1  6.8  6.6   HCT 09.8 - 52.0 % 24.5  21.0  21.4   Platelets 150 - 400 K/uL 253   307   NEUT# 1.7 - 7.7 K/uL 5.2     Lymph# 0.7 - 4.0 K/uL 1.1       Neurologic: Patient does not have protective sensation bilateral lower extremities.   MUSCULOSKELETAL:   Skin: Examination patient has sacral hip ulcers that extend down to the bone.  There is  no isolated abscess.  Review of the CT abdomen and pelvis shows extensive destructive bony changes of the left hip with destructive bony changes and a wound that communicates with the acetabulum.  The left hip is dislocated.  Patient also has an ulcer that extends down to the right hip  Hemoglobin 8.1 white cell count 6.6 with a albumin less than 1.5.  Assessment: Assessment paraplegia with chronic ulcers both hips with chronic osteomyelitis.  Plan: Patient states that he would like to be transferred to a [facility]term care [facility] facility such as in [facility] or [place].  Patient states that the [place] will transfer him to these facilities.  No surgical intervention indicated at this time, continue wound care as per wound ostomy continence nursing.  Plan for [facility] to [facility]term care [facility] facility.  Thank you for the consult and the opportunity to see Mr. Roselee Culver, MD [facility] [phone] 2:30 PM

## 2023-04-10 NOTE — Progress Notes (Signed)
[facility]   Reason for visit: Follow up on osteomyelitis with bacteremia  Interval History: repeat blood cultures sent.  Staph aureus and Strep group C from one set of blood cultures, no new growth.   Afebrile > 24 hours WBC wnl.   Day 3 antibiotics  Physical Exam: Constitutional:  Vitals:   04/10/23 1200 04/10/23 1300  BP: 106/67 113/73  Pulse: 90 91  Resp: 19 15  Temp:    SpO2: 97% 95%   patient appears in NAD Respiratory: Normal respiratory effort  Review of Systems: Constitutional: negative for fevers and chills  Lab Results  Component Value Date   WBC 6.6 04/10/2023   HGB 8.1 (L) 04/10/2023   HCT 24.5 (L) 04/10/2023   MCV 85.4 04/10/2023   PLT 253 04/10/2023    Lab Results  Component Value Date   CREATININE 0.51 (L) 04/10/2023   BUN <5 (L) 04/10/2023   NA 128 (L) 04/10/2023   K 4.4 04/10/2023   CL 100 04/10/2023   CO2 22 04/10/2023    Lab Results  Component Value Date   ALT 14 04/08/2023   AST 23 04/08/2023   ALKPHOS 98 04/08/2023     Microbiology: Recent Results (from the past 240 hour(s))  Culture, blood (Routine x 2)     Status: None (Preliminary result)   Collection Time: 04/08/23  6:22 PM   Specimen: BLOOD  Result Value Ref Range Status   Specimen Description BLOOD SITE NOT SPECIFIED  Final   Special Requests   Final    BOTTLES DRAWN AEROBIC ONLY Blood Culture results may not be optimal due to an inadequate volume of blood received in culture bottles   Culture   Final    NO GROWTH 2 DAYS Performed at [facility], [address], [place], [place] [address]    Report Status PENDING  Incomplete  Culture, blood (Routine x 2)     Status: Abnormal (Preliminary result)   Collection Time: 04/08/23  8:08 PM   Specimen: BLOOD  Result Value Ref Range Status   Specimen Description BLOOD SITE NOT SPECIFIED  Final   Special Requests   Final    BOTTLES DRAWN AEROBIC AND ANAEROBIC Blood Culture adequate volume    Culture  Setup Time   Final    GRAM POSITIVE COCCI IN CHAINS IN BOTH AEROBIC AND ANAEROBIC BOTTLES CRITICAL RESULT CALLED TO, READ BACK BY AND VERIFIED WITH: E. SINCLAIR PHARMD, AT 0940 D. VANHOOK GRAM POSITIVE COCCI IN CLUSTERS AEROBIC BOTTLE ONLY CRITICAL RESULT CALLED TO, READ BACK BY AND VERIFIED WITH: PHARMD EMILY S. [id number] FCP    Culture (A)  Final    STAPHYLOCOCCUS AUREUS STREPTOCOCCUS GROUP C CULTURE REINCUBATED FOR BETTER GROWTH Performed at [facility], [address], [place], [place] [address]    Report Status PENDING  Incomplete  Blood Culture ID Panel (Reflexed)     Status: Abnormal   Collection Time: 04/08/23  8:08 PM  Result Value Ref Range Status   Enterococcus faecalis NOT DETECTED NOT DETECTED Final   Enterococcus Faecium NOT DETECTED NOT DETECTED Final   Listeria monocytogenes NOT DETECTED NOT DETECTED Final   Staphylococcus species NOT DETECTED NOT DETECTED Final   Staphylococcus aureus (BCID) NOT DETECTED NOT DETECTED Final   Staphylococcus epidermidis NOT DETECTED NOT DETECTED Final   Staphylococcus lugdunensis NOT DETECTED NOT DETECTED Final   Streptococcus species DETECTED (A) NOT DETECTED Final    Comment: Not Enterococcus species, Streptococcus  agalactiae, Streptococcus pyogenes, or Streptococcus pneumoniae. CRITICAL RESULT CALLED TO, READ BACK BY AND VERIFIED WITH: E. SINCLAIR PHARMD, AT 0940 D. VANHOOK    Streptococcus agalactiae NOT DETECTED NOT DETECTED Final   Streptococcus pneumoniae NOT DETECTED NOT DETECTED Final   Streptococcus pyogenes NOT DETECTED NOT DETECTED Final   A.calcoaceticus-baumannii NOT DETECTED NOT DETECTED Final   Bacteroides fragilis NOT DETECTED NOT DETECTED Final   Enterobacterales NOT DETECTED NOT DETECTED Final   Enterobacter cloacae complex NOT DETECTED NOT DETECTED Final   Escherichia coli NOT DETECTED NOT DETECTED Final   Klebsiella aerogenes NOT DETECTED NOT DETECTED Final   Klebsiella oxytoca NOT DETECTED  NOT DETECTED Final   Klebsiella pneumoniae NOT DETECTED NOT DETECTED Final   Proteus species NOT DETECTED NOT DETECTED Final   Salmonella species NOT DETECTED NOT DETECTED Final   Serratia marcescens NOT DETECTED NOT DETECTED Final   Haemophilus influenzae NOT DETECTED NOT DETECTED Final   Neisseria meningitidis NOT DETECTED NOT DETECTED Final   Pseudomonas aeruginosa NOT DETECTED NOT DETECTED Final   Stenotrophomonas maltophilia NOT DETECTED NOT DETECTED Final   Candida albicans NOT DETECTED NOT DETECTED Final   Candida auris NOT DETECTED NOT DETECTED Final   Candida glabrata NOT DETECTED NOT DETECTED Final   Candida krusei NOT DETECTED NOT DETECTED Final   Candida parapsilosis NOT DETECTED NOT DETECTED Final   Candida tropicalis NOT DETECTED NOT DETECTED Final   Cryptococcus neoformans/gattii NOT DETECTED NOT DETECTED Final    Comment: Performed at [facility], [address], [place], [place] [address]  Blood Culture ID Panel (Reflexed)     Status: Abnormal   Collection Time: 04/08/23  8:08 PM  Result Value Ref Range Status   Enterococcus faecalis NOT DETECTED NOT DETECTED Final   Enterococcus Faecium NOT DETECTED NOT DETECTED Final   Listeria monocytogenes NOT DETECTED NOT DETECTED Final   Staphylococcus species DETECTED (A) NOT DETECTED Final    Comment: CRITICAL RESULT CALLED TO, READ BACK BY AND VERIFIED WITH: PHARMD EMILY S. 1143 [id number] FCP    Staphylococcus aureus (BCID) DETECTED (A) NOT DETECTED Final    Comment: Methicillin (oxacillin)-resistant Staphylococcus aureus (MRSA). MRSA is predictably resistant to beta-lactam antibiotics (except ceftaroline). Preferred therapy is vancomycin unless clinically contraindicated. Patient requires contact precautions if  hospitalized. CRITICAL RESULT CALLED TO, READ BACK BY AND VERIFIED WITH: PHARMD EMILY S. [phone] [id number] FCP    Staphylococcus epidermidis NOT DETECTED NOT DETECTED Final   Staphylococcus lugdunensis NOT DETECTED  NOT DETECTED Final   Streptococcus species DETECTED (A) NOT DETECTED Final    Comment: Not Enterococcus species, Streptococcus agalactiae, Streptococcus pyogenes, or Streptococcus pneumoniae. CRITICAL RESULT CALLED TO, READ BACK BY AND VERIFIED WITH: PHARMD EMILY S. [phone] [id number] FCP    Streptococcus agalactiae NOT DETECTED NOT DETECTED Final   Streptococcus pneumoniae NOT DETECTED NOT DETECTED Final   Streptococcus pyogenes NOT DETECTED NOT DETECTED Final   A.calcoaceticus-baumannii NOT DETECTED NOT DETECTED Final   Bacteroides fragilis NOT DETECTED NOT DETECTED Final   Enterobacterales NOT DETECTED NOT DETECTED Final   Enterobacter cloacae complex NOT DETECTED NOT DETECTED Final   Escherichia coli NOT DETECTED NOT DETECTED Final   Klebsiella aerogenes NOT DETECTED NOT DETECTED Final   Klebsiella oxytoca NOT DETECTED NOT DETECTED Final   Klebsiella pneumoniae NOT DETECTED NOT DETECTED Final   Proteus species NOT DETECTED NOT DETECTED Final   Salmonella species NOT DETECTED NOT DETECTED Final   Serratia marcescens NOT DETECTED NOT DETECTED Final   Haemophilus influenzae NOT DETECTED NOT DETECTED  Final   Neisseria meningitidis NOT DETECTED NOT DETECTED Final   Pseudomonas aeruginosa NOT DETECTED NOT DETECTED Final   Stenotrophomonas maltophilia NOT DETECTED NOT DETECTED Final   Candida albicans NOT DETECTED NOT DETECTED Final   Candida auris NOT DETECTED NOT DETECTED Final   Candida glabrata NOT DETECTED NOT DETECTED Final   Candida krusei NOT DETECTED NOT DETECTED Final   Candida parapsilosis NOT DETECTED NOT DETECTED Final   Candida tropicalis NOT DETECTED NOT DETECTED Final   Cryptococcus neoformans/gattii NOT DETECTED NOT DETECTED Final   Meth resistant mecA/C and MREJ DETECTED (A) NOT DETECTED Final    Comment: CRITICAL RESULT CALLED TO, READ BACK BY AND VERIFIED WITH: PHARMD EMILY SMarland Kitchen [phone] FCP Performed at [facility], [address], [place], [place] [address]    Urine Culture     Status: Abnormal (Preliminary result)   Collection Time: 04/09/23 12:16 AM   Specimen: Urine, Random  Result Value Ref Range Status   Specimen Description URINE, RANDOM  Final   Special Requests NONE Reflexed from [id number]  Final   Culture (A)  Final    >=100,000 COLONIES/mL PROTEUS MIRABILIS SUSCEPTIBILITIES TO FOLLOW Performed at [facility], [address], [place], [place] [address]    Report Status PENDING  Incomplete    Impression/Plan:  1. Osteomyelitis and septic arthritis - in the setting of positive blood cultures.  Osteomyelitis based on CT scan.  To be seen by Dr. Lajoyce Corners.   On vancomycin and will continue  2.  Positive urine culture - fever curve and patient improving overall for treatment of #1.  I do not suspect he has a urinary infection.   Ceftriaxone started by primary team.   Ok to stop  3.  MRSA bacteremia - TTE without concerns.  Repeat blood cultures sent.  Due to #1, I do not think TEE indicated.   Will continue to monitor.

## 2023-04-11 DIAGNOSIS — R6521 Severe sepsis with septic shock: Secondary | ICD-10-CM | POA: Diagnosis not present

## 2023-04-11 DIAGNOSIS — R7881 Bacteremia: Secondary | ICD-10-CM

## 2023-04-11 DIAGNOSIS — L89150 Pressure ulcer of sacral region, unstageable: Secondary | ICD-10-CM

## 2023-04-11 DIAGNOSIS — B9562 Methicillin resistant Staphylococcus aureus infection as the cause of diseases classified elsewhere: Secondary | ICD-10-CM

## 2023-04-11 DIAGNOSIS — A419 Sepsis, unspecified organism: Secondary | ICD-10-CM | POA: Diagnosis not present

## 2023-04-11 DIAGNOSIS — M0009 Staphylococcal polyarthritis: Secondary | ICD-10-CM | POA: Diagnosis not present

## 2023-04-11 LAB — CULTURE, BLOOD (ROUTINE X 2)
Culture: NO GROWTH
Special Requests: ADEQUATE
Special Requests: ADEQUATE
Special Requests: ADEQUATE

## 2023-04-11 LAB — CBC
HCT: 25.5 % — ABNORMAL LOW (ref 39.0–52.0)
Hemoglobin: 8 g/dL — ABNORMAL LOW (ref 13.0–17.0)
MCH: 26.8 pg (ref 26.0–34.0)
MCHC: 31.4 g/dL (ref 30.0–36.0)
MCV: 85.6 fL (ref 80.0–100.0)
Platelets: 305 10*3/uL (ref 150–400)
RBC: 2.98 MIL/uL — ABNORMAL LOW (ref 4.22–5.81)
RDW: 17 % — ABNORMAL HIGH (ref 11.5–15.5)
WBC: 8.3 10*3/uL (ref 4.0–10.5)
nRBC: 0 % (ref 0.0–0.2)

## 2023-04-11 LAB — VANCOMYCIN, TROUGH: Vancomycin Tr: 5 ug/mL — ABNORMAL LOW (ref 15–20)

## 2023-04-11 LAB — URINE CULTURE

## 2023-04-11 LAB — LIPASE, BLOOD: Lipase: 26 U/L (ref 11–51)

## 2023-04-11 MED ORDER — ZINC SULFATE 220 (50 ZN) MG PO CAPS
220.0000 mg | ORAL_CAPSULE | Freq: Every day | ORAL | Status: DC
  Administered 2023-04-11: 220 mg via ORAL
  Filled 2023-04-11: qty 1

## 2023-04-11 MED ORDER — POLYETHYLENE GLYCOL 3350 17 G PO PACK
17.0000 g | PACK | Freq: Two times a day (BID) | ORAL | Status: AC
  Filled 2023-04-11 (×2): qty 1

## 2023-04-11 MED ORDER — VITAMIN D 25 MCG (1000 UNIT) PO TABS
1000.0000 [IU] | ORAL_TABLET | Freq: Every day | ORAL | Status: DC
  Administered 2023-04-11 – 2023-05-03 (×22): 1000 [IU] via ORAL
  Filled 2023-04-11 (×22): qty 1

## 2023-04-11 MED ORDER — SODIUM CHLORIDE 0.9 % IV SOLN
2.0000 g | INTRAVENOUS | Status: DC
  Administered 2023-04-11 – 2023-04-27 (×16): 2 g via INTRAVENOUS
  Filled 2023-04-11 (×16): qty 20

## 2023-04-11 MED ORDER — MORPHINE SULFATE (PF) 2 MG/ML IV SOLN
2.0000 mg | INTRAVENOUS | Status: DC | PRN
  Administered 2023-04-12 – 2023-04-27 (×23): 2 mg via INTRAVENOUS
  Filled 2023-04-11 (×26): qty 1

## 2023-04-11 MED ORDER — VANCOMYCIN HCL IN DEXTROSE 1-5 GM/200ML-% IV SOLN: 1000.0000 mg | Freq: Three times a day (TID) | INTRAVENOUS | Status: DC

## 2023-04-11 MED ORDER — VITAMIN C 500 MG PO TABS
500.0000 mg | ORAL_TABLET | Freq: Two times a day (BID) | ORAL | Status: DC
  Administered 2023-04-11 – 2023-04-14 (×7): 500 mg via ORAL
  Filled 2023-04-11 (×7): qty 1

## 2023-04-11 MED ORDER — SODIUM CHLORIDE 0.9 % IV SOLN
8.0000 mg/kg | Freq: Every day | INTRAVENOUS | Status: DC
  Administered 2023-04-11 – 2023-05-02 (×22): 600 mg via INTRAVENOUS
  Filled 2023-04-11 (×24): qty 12

## 2023-04-11 MED ORDER — ONDANSETRON HCL 4 MG/2ML IJ SOLN: 4.0000 mg | Freq: Four times a day (QID) | INTRAMUSCULAR | Status: DC | PRN

## 2023-04-11 MED ORDER — MORPHINE SULFATE (PF) 2 MG/ML IV SOLN
2.0000 mg | Freq: Once | INTRAVENOUS | Status: AC
  Administered 2023-04-11: 2 mg via INTRAVENOUS
  Filled 2023-04-11: qty 1

## 2023-04-11 NOTE — Progress Notes (Signed)
Pharmacy Antibiotic Note  Steve [name] is a 32 y.o. male admitted on 04/08/2023 with osteomyelitis and previous MRSA history. 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 Pharmacy has been consulted for zosyn and vancomycin dosing.   WBC improved to 6.6, CRP 11 and temp 98.3 (tmax 100.5). Scr 0.83 on admission, but renal function has improved back to baseline of 0.51 since initiation of vancomycin. Levels drawn after 7/24 1800 dose of vancomycin 1000mg  Q12h. Vanc pk 18 (drawn 2h after completion of infusion), Vanc Tr 5 (drawn appropriately).  Plan: Adjust to Vancomycin 1000 mg IV every 8 hours (eAUC 445.4) Monitor renal function Follow up LOT, signs of clinical improvement and levels as indicated  Temp (24hrs), Avg:98.4 F (36.9 C), Min:98.1 F (36.7 C), Max:98.8 F (37.1 C)  Recent Labs  Lab 04/08/23 1822 04/08/23 1828 04/08/23 2008 04/08/23 2219 04/09/23 0637 04/10/23 0650 04/10/23 2102 04/11/23 0606  WBC  --   --  10.6*  --  8.2 6.6  --   --   CREATININE 0.83  --   --   --  0.71 0.51*  --   --   LATICACIDVEN  --  2.8* 2.4* 2.1*  --   --   --   --   VANCOTROUGH  --   --   --   --   --   --   --  5*  VANCOPEAK  --   --   --   --   --   --  18*  --     Estimated Creatinine Clearance: 139.9 mL/min (A) (by C-G formula based on SCr of 0.51 mg/dL (L)).    Allergies  Allergen Reactions   Caffeine Anaphylaxis   Chocolate Anaphylaxis   Bactrim [Sulfamethoxazole-Trimethoprim] Other (See Comments)    Reaction not listed.   Tuberculin, Ppd     Other reaction(s): Eruption of skin   Sulfa Antibiotics Rash    Antimicrobials this admission: Vancomycin 7/22 >>  Zosyn 7/22 >> 7/23  Dose adjustments this admission: Vancomycin 1000mg  Q12h > 1750mg  Q12h  Microbiology results: 7/22 BCx: MRSA and GBS 7/23 UCx: Proteus  Mirabilis  Thank you for allowing pharmacy to be a part of this patient's care.  Rennis Petty, PharmD 04/11/2023 8:02 AM

## 2023-04-11 NOTE — Progress Notes (Signed)
PROGRESS NOTE    Steve [name]  JYN:[id number] DOB: Apr 03, 1991 DOA: 04/08/2023 PCP: [facility]   Brief Narrative:  32 year old male with past medical history as below which is significant for paraplegia, substance abuse issues, psychostimulant induced psychosis, neurogenic bladder, chronic hypotension on midodrine, chronic osteomyelitis, paranoid schizophrenia, and PTSD. He receives most of his medical care through the [place] where he was recently admitted for infections of bilateral ischial wounds.   Presents to the emergency department on 7/22 with complaints of weakness and malaise. Upon arrival arrival to the emergency department he was noted to have purulent drainage from his bilateral pressure wounds as well as abdominal pain. Imaging revealed concern for left hip septic arthritis/osteomyelitis.  Patient had hypotension refractory to fluids and admitted to the ICU with concerns for septic shock in the setting of septic arthritis/osteomyelitis of the left hip.   Assessment & Plan:   Principal Problem:   Septic shock (HCC) Active Problems:   MRSA bacteremia   Septic arthritis (HCC)   Pressure ulcer   Wound infection   Acute on chronic osteomyelitis septic arthritis of the left hip, septic shock, POA  polymicrobial with MRSA, Group C Streptococcus and Proteus mirabilis bacteremia Proteus mirabilis UTI -Antibiotics management per ID, vancomycin changed to daptomycin, Rocephin has been added to cover for Proteus bacteremia/UTI -Continue with wound care -ID following, vancomycin for osteomyelitis, ceftriaxone can be stopped for presumed UTI, questionable streptococcal bacteremia, TTE unremarkable, repeat cultures sent, no need for TEE at this time. -Orthopedics following, no indication for procedure per most recent note, plan for prolonged antibiotic course .   Acute on chronic normocytic anemia  -Transfuse as needed.   GERD -Resume home PPI    Hyponatremia Hypokalemia Hypomagnesemia Non-anion gap metabolic acidosis -Continue LR, follow repeat labs, replete electrolytes as necessary   Neurogenic bladder Paraplegia -Continue to follow clinically, no signs or symptoms of UTI at this time -Continue Foley   Schizophrenia Substance abuse  **Home med rec continues to be incomplete, records have been requested from the [place], will start Abilify at prior reported dose to ensure no lag in medications. -Self reports use of methamphetamines self-reports use of methamphetamines  Pressure ulcers -Continue with wound care, started on vitamin C and zinc Pressure Injury 05/30/21 Ankle Left;Lateral Unstageable - Full thickness tissue loss in which the base of the injury is covered by slough (yellow, tan, gray, green or brown) and/or eschar (tan, brown or black) in the wound bed. (Active)  05/30/21 1938  Location: Ankle  Location Orientation: Left;Lateral  Staging: Unstageable - Full thickness tissue loss in which the base of the injury is covered by slough (yellow, tan, gray, green or brown) and/or eschar (tan, brown or black) in the wound bed.  Wound Description (Comments):   Present on Admission: Yes     Pressure Injury 08/29/21 Ischial tuberosity Left Stage 4 - Full thickness tissue loss with exposed bone, tendon or muscle. 8M5H8 (Active)  08/29/21 0100  Location: Ischial tuberosity  Location Orientation: Left  Staging: Stage 4 - Full thickness tissue loss with exposed bone, tendon or muscle.  Wound Description (Comments): [id number]  Present on Admission: Yes     Pressure Injury 04/09/23 Pretibial Right;Lateral Stage 2 -  Partial thickness loss of dermis presenting as a shallow open injury with a red, pink wound bed without slough. full thickness, NOT a pressure injury (Active)  04/09/23 0351  Location: Pretibial  Location Orientation: Right;Lateral  Staging: Stage 2 -  Partial thickness loss  of dermis presenting as a shallow open injury  with a red, pink wound bed without slough.  Wound Description (Comments): full thickness, NOT a pressure injury  Present on Admission: Yes     Pressure Injury 04/09/23 Ischial tuberosity Right Stage 4 - Full thickness tissue loss with exposed bone, tendon or muscle. (Active)  04/09/23 0100  Location: Ischial tuberosity  Location Orientation: Right  Staging: Stage 4 - Full thickness tissue loss with exposed bone, tendon or muscle.  Wound Description (Comments):   Present on Admission: Yes     Pressure Injury 04/09/23 Elbow Left;Posterior Stage 2 -  Partial thickness loss of dermis presenting as a shallow open injury with a red, pink wound bed without slough. (Active)  04/09/23 0330  Location: Elbow  Location Orientation: Left;Posterior  Staging: Stage 2 -  Partial thickness loss of dermis presenting as a shallow open injury with a red, pink wound bed without slough.  Wound Description (Comments):   Present on Admission: Yes      DVT prophylaxis: SCDs Start: 04/09/23 0140   Code Status:   Code Status: Full Code  Family Communication: None at bedside  Status is: Inpatient  Dispo: The patient is from: Home              Anticipated d/c is to: To be determined              Anticipated d/c date is: 72+ hours              Patient currently not medically stable for discharge  Consultants:  PCCM, ID, orthopedic  Procedures:  None  Antimicrobials:  Vancomycin  Subjective:  No significant events overnight, he complains of generalized pain  Objective: Vitals:   04/11/23 0400 04/11/23 0800 04/11/23 0900 04/11/23 1145  BP: 113/74 119/65 (!) 96/53 (!) 93/55  Pulse: (!) 103   87  Resp: 18 (!) 24 (!) 25 (!) 22  Temp: 98.3 F (36.8 C)  97.9 F (36.6 C) (!) 97 F (36.1 C)  TempSrc: Oral  Oral Axillary  SpO2: 98%  98% 99%  Weight: 76 kg     Height:        Intake/Output Summary (Last 24 hours) at 04/11/2023 1445 Last data filed at 04/11/2023 0500 Gross per 24 hour  Intake  1800 ml  Output 3725 ml  Net -1925 ml   Filed Weights   04/09/23 0200 04/10/23 0704 04/11/23 0400  Weight: 72.6 kg 72.1 kg 76 kg    Examination:    Awake Alert, Oriented X 3, currently ill-appearing, flat affect Symmetrical Chest wall movement, Good air movement bilaterally, CTAB RRR,No Gallops,Rubs or new Murmurs, No Parasternal Heave +ve B.Sounds, Abd Soft, No tenderness, No rebound - guarding or rigidity. Things applied to pressure ulcers.   Data Reviewed: I have personally reviewed following labs and imaging studies  CBC: Recent Labs  Lab 04/08/23 2008 04/09/23 1610 04/09/23 1537 04/10/23 0650 04/11/23 0606  WBC 10.6* 8.2  --  6.6 8.3  NEUTROABS 9.3*  --   --  5.2  --   HGB 6.3* 6.6* 6.8* 8.1* 8.0*  HCT 20.8* 21.4* 21.0* 24.5* 25.5*  MCV 87.0 87.7  --  85.4 85.6  PLT 371 307  --  253 305   Basic Metabolic Panel: Recent Labs  Lab 04/08/23 1822 04/09/23 0637 04/10/23 0650  NA 129* 126* 128*  K 3.6 3.2* 4.4  CL 97* 98 100  CO2 20* 18* 22  GLUCOSE 100* 123* 97  BUN  9 8 <5*  CREATININE 0.83 0.71 0.51*  CALCIUM 7.6* 7.1* 7.1*  MG  --  1.4*  --   PHOS  --  3.3  --    GFR: Estimated Creatinine Clearance: 139.9 mL/min (A) (by C-G formula based on SCr of 0.51 mg/dL (L)). Liver Function Tests: Recent Labs  Lab 04/08/23 1822  AST 23  ALT 14  ALKPHOS 98  BILITOT 0.4  PROT 7.2  ALBUMIN <1.5*   Recent Labs  Lab 04/11/23 0606  LIPASE 26   No results for input(s): "AMMONIA" in the last 168 hours. Coagulation Profile: Recent Labs  Lab 04/08/23 1822 04/10/23 0650  INR 1.3* 1.2   Cardiac Enzymes: No results for input(s): "CKTOTAL", "CKMB", "CKMBINDEX", "TROPONINI" in the last 168 hours. BNP (last 3 results) No results for input(s): "PROBNP" in the last 8760 hours. HbA1C: No results for input(s): "HGBA1C" in the last 72 hours. CBG: Recent Labs  Lab 04/09/23 1929 04/09/23 2322 04/10/23 0313 04/10/23 0715 04/10/23 1121  GLUCAP 114* 109* 93 92  122*   Lipid Profile: No results for input(s): "CHOL", "HDL", "LDLCALC", "TRIG", "CHOLHDL", "LDLDIRECT" in the last 72 hours. Thyroid Function Tests: No results for input(s): "TSH", "T4TOTAL", "FREET4", "T3FREE", "THYROIDAB" in the last 72 hours. Anemia Panel: Recent Labs    04/10/23 0650  FERRITIN 795*  TIBC 99*  IRON 15*   Sepsis Labs: Recent Labs  Lab 04/08/23 1828 04/08/23 2008 04/08/23 2219  LATICACIDVEN 2.8* 2.4* 2.1*    Recent Results (from the past 240 hour(s))  Culture, blood (Routine x 2)     Status: None (Preliminary result)   Collection Time: 04/08/23  6:22 PM   Specimen: BLOOD  Result Value Ref Range Status   Specimen Description BLOOD SITE NOT SPECIFIED  Final   Special Requests   Final    BOTTLES DRAWN AEROBIC ONLY Blood Culture results may not be optimal due to an inadequate volume of blood received in culture bottles   Culture   Final    NO GROWTH 3 DAYS Performed at [facility], [address], [place], [place] [address]    Report Status PENDING  Incomplete  Culture, blood (Routine x 2)     Status: Abnormal (Preliminary result)   Collection Time: 04/08/23  8:08 PM   Specimen: BLOOD  Result Value Ref Range Status   Specimen Description BLOOD SITE NOT SPECIFIED  Final   Special Requests   Final    BOTTLES DRAWN AEROBIC AND ANAEROBIC Blood Culture adequate volume   Culture  Setup Time   Final    GRAM POSITIVE COCCI IN CHAINS IN BOTH AEROBIC AND ANAEROBIC BOTTLES CRITICAL RESULT CALLED TO, READ BACK BY AND VERIFIED WITH: E. SINCLAIR PHARMD, AT 0940 D. VANHOOK GRAM POSITIVE COCCI IN CLUSTERS AEROBIC BOTTLE ONLY CRITICAL RESULT CALLED TO, READ BACK BY AND VERIFIED WITH: PHARMD EMILY S. [id number] FCP    Culture (A)  Final    STAPHYLOCOCCUS AUREUS STREPTOCOCCUS GROUP C PROTEUS MIRABILIS SUSCEPTIBILITIES TO FOLLOW CRITICAL RESULT CALLED TO, READ BACK BY AND VERIFIED WITH: PHARMD E.SINCLAIR AT 0940 ON 04/11/2023 BY T.SAAD. Sent to [facility] for  further susceptibility testing. Performed at [facility], [address] [place], [place] [address]    Report Status PENDING  Incomplete  Blood Culture ID Panel (Reflexed)     Status: Abnormal   Collection Time: 04/08/23  8:08 PM  Result Value Ref Range Status   Enterococcus faecalis NOT DETECTED NOT DETECTED Final   Enterococcus  Faecium NOT DETECTED NOT DETECTED Final   Listeria monocytogenes NOT DETECTED NOT DETECTED Final   Staphylococcus species NOT DETECTED NOT DETECTED Final   Staphylococcus aureus (BCID) NOT DETECTED NOT DETECTED Final   Staphylococcus epidermidis NOT DETECTED NOT DETECTED Final   Staphylococcus lugdunensis NOT DETECTED NOT DETECTED Final   Streptococcus species DETECTED (A) NOT DETECTED Final    Comment: Not Enterococcus species, Streptococcus agalactiae, Streptococcus pyogenes, or Streptococcus pneumoniae. CRITICAL RESULT CALLED TO, READ BACK BY AND VERIFIED WITH: E. SINCLAIR PHARMD, AT 0940 D. VANHOOK    Streptococcus agalactiae NOT DETECTED NOT DETECTED Final   Streptococcus pneumoniae NOT DETECTED NOT DETECTED Final   Streptococcus pyogenes NOT DETECTED NOT DETECTED Final   A.calcoaceticus-baumannii NOT DETECTED NOT DETECTED Final   Bacteroides fragilis NOT DETECTED NOT DETECTED Final   Enterobacterales NOT DETECTED NOT DETECTED Final   Enterobacter cloacae complex NOT DETECTED NOT DETECTED Final   Escherichia coli NOT DETECTED NOT DETECTED Final   Klebsiella aerogenes NOT DETECTED NOT DETECTED Final   Klebsiella oxytoca NOT DETECTED NOT DETECTED Final   Klebsiella pneumoniae NOT DETECTED NOT DETECTED Final   Proteus species NOT DETECTED NOT DETECTED Final   Salmonella species NOT DETECTED NOT DETECTED Final   Serratia marcescens NOT DETECTED NOT DETECTED Final   Haemophilus influenzae NOT DETECTED NOT DETECTED Final   Neisseria meningitidis NOT DETECTED NOT DETECTED Final   Pseudomonas aeruginosa NOT DETECTED NOT DETECTED Final    Stenotrophomonas maltophilia NOT DETECTED NOT DETECTED Final   Candida albicans NOT DETECTED NOT DETECTED Final   Candida auris NOT DETECTED NOT DETECTED Final   Candida glabrata NOT DETECTED NOT DETECTED Final   Candida krusei NOT DETECTED NOT DETECTED Final   Candida parapsilosis NOT DETECTED NOT DETECTED Final   Candida tropicalis NOT DETECTED NOT DETECTED Final   Cryptococcus neoformans/gattii NOT DETECTED NOT DETECTED Final    Comment: Performed at [facility], [address], [place], [place] [address]  Blood Culture ID Panel (Reflexed)     Status: Abnormal   Collection Time: 04/08/23  8:08 PM  Result Value Ref Range Status   Enterococcus faecalis NOT DETECTED NOT DETECTED Final   Enterococcus Faecium NOT DETECTED NOT DETECTED Final   Listeria monocytogenes NOT DETECTED NOT DETECTED Final   Staphylococcus species DETECTED (A) NOT DETECTED Final    Comment: CRITICAL RESULT CALLED TO, READ BACK BY AND VERIFIED WITH: PHARMD EMILY S. [phone] [id number] FCP    Staphylococcus aureus (BCID) DETECTED (A) NOT DETECTED Final    Comment: Methicillin (oxacillin)-resistant Staphylococcus aureus (MRSA). MRSA is predictably resistant to beta-lactam antibiotics (except ceftaroline). Preferred therapy is vancomycin unless clinically contraindicated. Patient requires contact precautions if  hospitalized. CRITICAL RESULT CALLED TO, READ BACK BY AND VERIFIED WITH: PHARMD EMILY S. [phone] FCP    Staphylococcus epidermidis NOT DETECTED NOT DETECTED Final   Staphylococcus lugdunensis NOT DETECTED NOT DETECTED Final   Streptococcus species DETECTED (A) NOT DETECTED Final    Comment: Not Enterococcus species, Streptococcus agalactiae, Streptococcus pyogenes, or Streptococcus pneumoniae. CRITICAL RESULT CALLED TO, READ BACK BY AND VERIFIED WITH: PHARMD EMILY S. [phone] FCP    Streptococcus agalactiae NOT DETECTED NOT DETECTED Final   Streptococcus pneumoniae NOT DETECTED NOT DETECTED Final    Streptococcus pyogenes NOT DETECTED NOT DETECTED Final   A.calcoaceticus-baumannii NOT DETECTED NOT DETECTED Final   Bacteroides fragilis NOT DETECTED NOT DETECTED Final   Enterobacterales NOT DETECTED NOT DETECTED Final   Enterobacter cloacae complex NOT DETECTED NOT DETECTED Final  Escherichia coli NOT DETECTED NOT DETECTED Final   Klebsiella aerogenes NOT DETECTED NOT DETECTED Final   Klebsiella oxytoca NOT DETECTED NOT DETECTED Final   Klebsiella pneumoniae NOT DETECTED NOT DETECTED Final   Proteus species NOT DETECTED NOT DETECTED Final   Salmonella species NOT DETECTED NOT DETECTED Final   Serratia marcescens NOT DETECTED NOT DETECTED Final   Haemophilus influenzae NOT DETECTED NOT DETECTED Final   Neisseria meningitidis NOT DETECTED NOT DETECTED Final   Pseudomonas aeruginosa NOT DETECTED NOT DETECTED Final   Stenotrophomonas maltophilia NOT DETECTED NOT DETECTED Final   Candida albicans NOT DETECTED NOT DETECTED Final   Candida auris NOT DETECTED NOT DETECTED Final   Candida glabrata NOT DETECTED NOT DETECTED Final   Candida krusei NOT DETECTED NOT DETECTED Final   Candida parapsilosis NOT DETECTED NOT DETECTED Final   Candida tropicalis NOT DETECTED NOT DETECTED Final   Cryptococcus neoformans/gattii NOT DETECTED NOT DETECTED Final   Meth resistant mecA/C and MREJ DETECTED (A) NOT DETECTED Final    Comment: CRITICAL RESULT CALLED TO, READ BACK BY AND VERIFIED WITH: PHARMD EMILY S. [phone] FCP Performed at [facility], [address] [place], [place] [address]   Urine Culture     Status: Abnormal (Preliminary result)   Collection Time: 04/09/23 12:16 AM   Specimen: Urine, Random  Result Value Ref Range Status   Specimen Description URINE, RANDOM  Final   Special Requests NONE Reflexed from [id number]  Final   Culture (A)  Final    >=100,000 COLONIES/mL PROTEUS MIRABILIS 30,000 COLONIES/mL ESCHERICHIA COLI SUSCEPTIBILITIES TO FOLLOW Performed at [facility], [address], [place], [place] [address]    Report Status PENDING  Incomplete   Organism ID, Bacteria ESCHERICHIA COLI (A)  Final      Susceptibility   Escherichia coli - MIC*    AMPICILLIN >=32 RESISTANT Resistant     CEFAZOLIN <=4 SENSITIVE Sensitive     CEFEPIME <=0.12 SENSITIVE Sensitive     CEFTRIAXONE <=0.25 SENSITIVE Sensitive     CIPROFLOXACIN >=4 RESISTANT Resistant     GENTAMICIN <=1 SENSITIVE Sensitive     IMIPENEM <=0.25 SENSITIVE Sensitive     NITROFURANTOIN <=16 SENSITIVE Sensitive     TRIMETH/SULFA >=320 RESISTANT Resistant     AMPICILLIN/SULBACTAM 4 SENSITIVE Sensitive     PIP/TAZO <=4 SENSITIVE Sensitive     * 30,000 COLONIES/mL ESCHERICHIA COLI  Culture, blood (Routine X 2) w Reflex to ID Panel     Status: None (Preliminary result)   Collection Time: 04/10/23  6:50 AM   Specimen: BLOOD LEFT HAND  Result Value Ref Range Status   Specimen Description BLOOD LEFT HAND  Final   Special Requests   Final    BOTTLES DRAWN AEROBIC AND ANAEROBIC Blood Culture adequate volume   Culture   Final    NO GROWTH 1 DAY Performed at [facility], [address], [place], [place] [address]    Report Status PENDING  Incomplete  Culture, blood (Routine X 2) w Reflex to ID Panel     Status: None (Preliminary result)   Collection Time: 04/10/23  6:50 AM   Specimen: BLOOD LEFT HAND  Result Value Ref Range Status   Specimen Description BLOOD LEFT HAND  Final   Special Requests   Final    BOTTLES DRAWN AEROBIC AND ANAEROBIC Blood Culture adequate volume   Culture   Final    NO GROWTH 1 DAY Performed at [facility],  [address] [place], [place] [address]    Report Status PENDING  Incomplete         Radiology Studies: No results found.   Scheduled Meds:  ARIPiprazole  15 mg Oral Daily   Chlorhexidine Gluconate Cloth  6 each Topical Daily   cholecalciferol  1,000 Units Oral Daily   feeding supplement  237 mL Oral TID BM   leptospermum manuka  honey  1 Application Topical Daily   midodrine  10 mg Oral Q8H   multivitamin with minerals  1 tablet Oral Daily   nutrition supplement (JUVEN)  1 packet Oral BID BM   pantoprazole (PROTONIX) IV  40 mg Intravenous Q12H   polyethylene glycol  17 g Oral BID   Continuous Infusions:  cefTRIAXone (ROCEPHIN)  IV 2 g (04/11/23 1156)   DAPTOmycin (CUBICIN) 600 mg in sodium chloride 0.9 % IVPB 600 mg (04/11/23 1200)     LOS: 2 days   Huey Bienenstock, MD [facility]  If 7PM-7AM, please contact night-coverage [email or web address]  04/11/2023, 2:45 PM

## 2023-04-11 NOTE — Progress Notes (Signed)
[facility]  Date of Admission:  04/08/2023     Total days of antibiotics 4         ASSESSMENT:  Steve [name] initial blood cultures from 7/22 are now polymicrobial with MRSA, Group C Streptococcus and Proteus mirabilis with sensitivities pending. Blood cultures from 7/24 remain without growth. No surgical interventions planned. Will add ceftriaxone for coverage of Proteus and change vancomycin to Daptomycin for coverage of MRSA.  Will need 6 weeks of antibiotics with request to be discharged to [facility] long term care facility. Continue to monitor cultures for clearance of bacteremia and continue wound care. Will need therapeutic monitoring of renal function and CK levels. Plan of care discussed with Steve [name] Remaining medical and supportive care per Internal Medicine.   PLAN:  Change vancomycin to daptomycin.  Add ceftriaxone for coverage of Proteus bacteremia.  Continue wound care Site off loading and optimization of nutrition. Monitor blood cultures for clearance of bacteremia. Await clearance for line placement.  Therapeutic drug monitoring of renal function and CK levels.  Remaining medical and supportive care per Internal Medicine.   Principal Problem:   Septic shock (HCC) Active Problems:   MRSA bacteremia   Septic arthritis (HCC)   Pressure ulcer   Wound infection    ARIPiprazole  15 mg Oral Daily   Chlorhexidine Gluconate Cloth  6 each Topical Daily   cholecalciferol  1,000 Units Oral Daily   feeding supplement  237 mL Oral TID BM   leptospermum manuka honey  1 Application Topical Daily   midodrine  10 mg Oral Q8H   multivitamin with minerals  1 tablet Oral Daily   nutrition supplement (JUVEN)  1 packet Oral BID BM   pantoprazole (PROTONIX) IV  40 mg Intravenous Q12H   polyethylene glycol  17 g Oral BID    SUBJECTIVE:  Afebrile overnight with no acute events. Moved out of the ICU. Not feeling great but doing okay.   Allergies   Allergen Reactions   Caffeine Anaphylaxis   Chocolate Anaphylaxis   Bactrim [Sulfamethoxazole-Trimethoprim] Other (See Comments)    Reaction not listed.   Tuberculin, Ppd     Other reaction(s): Eruption of skin   Sulfa Antibiotics Rash     Review of Systems: Review of Systems  Constitutional:  Negative for chills, fever and weight loss.  Respiratory:  Negative for cough, shortness of breath and wheezing.   Cardiovascular:  Negative for chest pain and leg swelling.  Gastrointestinal:  Negative for abdominal pain, constipation, diarrhea, nausea and vomiting.  Skin:  Negative for rash.      OBJECTIVE: Vitals:   04/11/23 0000 04/11/23 0200 04/11/23 0400 04/11/23 0900  BP: 112/65  113/74 (!) 96/53  Pulse: (!) 119 (!) 109 (!) 103   Resp: 13 (!) 21 18 (!) 25  Temp: 98.5 F (36.9 C)  98.3 F (36.8 C) 97.9 F (36.6 C)  TempSrc: Oral  Oral Oral  SpO2: 94% 96% 98% 98%  Weight:   76 kg   Height:       Body mass index is 23.37 kg/m.  Physical Exam Constitutional:      General: He is not in acute distress.    Appearance: He is well-developed.  Cardiovascular:     Rate and Rhythm: Normal rate and regular rhythm.     Heart sounds: Normal heart sounds.  Pulmonary:     Effort: Pulmonary effort is normal.     Breath sounds: Normal breath sounds.  Skin:    General: Skin is warm and dry.  Neurological:     Mental Status: He is alert.  Psychiatric:        Mood and Affect: Mood normal.     Lab Results Lab Results  Component Value Date   WBC 6.6 04/10/2023   HGB 8.1 (L) 04/10/2023   HCT 24.5 (L) 04/10/2023   MCV 85.4 04/10/2023   PLT 253 04/10/2023    Lab Results  Component Value Date   CREATININE 0.51 (L) 04/10/2023   BUN <5 (L) 04/10/2023   NA 128 (L) 04/10/2023   K 4.4 04/10/2023   CL 100 04/10/2023   CO2 22 04/10/2023    Lab Results  Component Value Date   ALT 14 04/08/2023   AST 23 04/08/2023   ALKPHOS 98 04/08/2023   BILITOT 0.4 04/08/2023      Microbiology: Recent Results (from the past 240 hour(s))  Culture, blood (Routine x 2)     Status: None (Preliminary result)   Collection Time: 04/08/23  6:22 PM   Specimen: BLOOD  Result Value Ref Range Status   Specimen Description BLOOD SITE NOT SPECIFIED  Final   Special Requests   Final    BOTTLES DRAWN AEROBIC ONLY Blood Culture results may not be optimal due to an inadequate volume of blood received in culture bottles   Culture   Final    NO GROWTH 3 DAYS Performed at [facility], [address], [place], [place] [address]    Report Status PENDING  Incomplete  Culture, blood (Routine x 2)     Status: Abnormal (Preliminary result)   Collection Time: 04/08/23  8:08 PM   Specimen: BLOOD  Result Value Ref Range Status   Specimen Description BLOOD SITE NOT SPECIFIED  Final   Special Requests   Final    BOTTLES DRAWN AEROBIC AND ANAEROBIC Blood Culture adequate volume   Culture  Setup Time   Final    GRAM POSITIVE COCCI IN CHAINS IN BOTH AEROBIC AND ANAEROBIC BOTTLES CRITICAL RESULT CALLED TO, READ BACK BY AND VERIFIED WITH: E. SINCLAIR PHARMD, AT 0940 D. VANHOOK GRAM POSITIVE COCCI IN CLUSTERS AEROBIC BOTTLE ONLY CRITICAL RESULT CALLED TO, READ BACK BY AND VERIFIED WITH: PHARMD EMILY S. [id number] FCP    Culture (A)  Final    STAPHYLOCOCCUS AUREUS STREPTOCOCCUS GROUP C PROTEUS MIRABILIS SUSCEPTIBILITIES TO FOLLOW CRITICAL RESULT CALLED TO, READ BACK BY AND VERIFIED WITH: PHARMD E.SINCLAIR AT 0940 ON 04/11/2023 BY T.SAAD. Performed at [facility], [address], [place], [place] [address]    Report Status PENDING  Incomplete  Blood Culture ID Panel (Reflexed)     Status: Abnormal   Collection Time: 04/08/23  8:08 PM  Result Value Ref Range Status   Enterococcus faecalis NOT DETECTED NOT DETECTED Final   Enterococcus Faecium NOT DETECTED NOT DETECTED Final   Listeria monocytogenes NOT DETECTED NOT DETECTED Final   Staphylococcus species NOT DETECTED NOT  DETECTED Final   Staphylococcus aureus (BCID) NOT DETECTED NOT DETECTED Final   Staphylococcus epidermidis NOT DETECTED NOT DETECTED Final   Staphylococcus lugdunensis NOT DETECTED NOT DETECTED Final   Streptococcus species DETECTED (A) NOT DETECTED Final    Comment: Not Enterococcus species, Streptococcus agalactiae, Streptococcus pyogenes, or Streptococcus pneumoniae. CRITICAL RESULT CALLED TO, READ BACK BY AND VERIFIED WITH: E. SINCLAIR PHARMD, AT 0940 D. VANHOOK    Streptococcus agalactiae NOT DETECTED NOT DETECTED Final   Streptococcus pneumoniae NOT DETECTED NOT DETECTED Final  Streptococcus pyogenes NOT DETECTED NOT DETECTED Final   A.calcoaceticus-baumannii NOT DETECTED NOT DETECTED Final   Bacteroides fragilis NOT DETECTED NOT DETECTED Final   Enterobacterales NOT DETECTED NOT DETECTED Final   Enterobacter cloacae complex NOT DETECTED NOT DETECTED Final   Escherichia coli NOT DETECTED NOT DETECTED Final   Klebsiella aerogenes NOT DETECTED NOT DETECTED Final   Klebsiella oxytoca NOT DETECTED NOT DETECTED Final   Klebsiella pneumoniae NOT DETECTED NOT DETECTED Final   Proteus species NOT DETECTED NOT DETECTED Final   Salmonella species NOT DETECTED NOT DETECTED Final   Serratia marcescens NOT DETECTED NOT DETECTED Final   Haemophilus influenzae NOT DETECTED NOT DETECTED Final   Neisseria meningitidis NOT DETECTED NOT DETECTED Final   Pseudomonas aeruginosa NOT DETECTED NOT DETECTED Final   Stenotrophomonas maltophilia NOT DETECTED NOT DETECTED Final   Candida albicans NOT DETECTED NOT DETECTED Final   Candida auris NOT DETECTED NOT DETECTED Final   Candida glabrata NOT DETECTED NOT DETECTED Final   Candida krusei NOT DETECTED NOT DETECTED Final   Candida parapsilosis NOT DETECTED NOT DETECTED Final   Candida tropicalis NOT DETECTED NOT DETECTED Final   Cryptococcus neoformans/gattii NOT DETECTED NOT DETECTED Final    Comment: Performed at [facility], [address], [place], [place] [address]  Blood Culture ID Panel (Reflexed)     Status: Abnormal   Collection Time: 04/08/23  8:08 PM  Result Value Ref Range Status   Enterococcus faecalis NOT DETECTED NOT DETECTED Final   Enterococcus Faecium NOT DETECTED NOT DETECTED Final   Listeria monocytogenes NOT DETECTED NOT DETECTED Final   Staphylococcus species DETECTED (A) NOT DETECTED Final    Comment: CRITICAL RESULT CALLED TO, READ BACK BY AND VERIFIED WITH: PHARMD EMILY S. [id number] FCP    Staphylococcus aureus (BCID) DETECTED (A) NOT DETECTED Final    Comment: Methicillin (oxacillin)-resistant Staphylococcus aureus (MRSA). MRSA is predictably resistant to beta-lactam antibiotics (except ceftaroline). Preferred therapy is vancomycin unless clinically contraindicated. Patient requires contact precautions if  hospitalized. CRITICAL RESULT CALLED TO, READ BACK BY AND VERIFIED WITH: PHARMD EMILY S. [phone] FCP    Staphylococcus epidermidis NOT DETECTED NOT DETECTED Final   Staphylococcus lugdunensis NOT DETECTED NOT DETECTED Final   Streptococcus species DETECTED (A) NOT DETECTED Final    Comment: Not Enterococcus species, Streptococcus agalactiae, Streptococcus pyogenes, or Streptococcus pneumoniae. CRITICAL RESULT CALLED TO, READ BACK BY AND VERIFIED WITH: PHARMD EMILY S. [phone] FCP    Streptococcus agalactiae NOT DETECTED NOT DETECTED Final   Streptococcus pneumoniae NOT DETECTED NOT DETECTED Final   Streptococcus pyogenes NOT DETECTED NOT DETECTED Final   A.calcoaceticus-baumannii NOT DETECTED NOT DETECTED Final   Bacteroides fragilis NOT DETECTED NOT DETECTED Final   Enterobacterales NOT DETECTED NOT DETECTED Final   Enterobacter cloacae complex NOT DETECTED NOT DETECTED Final   Escherichia coli NOT DETECTED NOT DETECTED Final   Klebsiella aerogenes NOT DETECTED NOT DETECTED Final   Klebsiella oxytoca NOT DETECTED NOT DETECTED Final   Klebsiella pneumoniae NOT DETECTED NOT DETECTED  Final   Proteus species NOT DETECTED NOT DETECTED Final   Salmonella species NOT DETECTED NOT DETECTED Final   Serratia marcescens NOT DETECTED NOT DETECTED Final   Haemophilus influenzae NOT DETECTED NOT DETECTED Final   Neisseria meningitidis NOT DETECTED NOT DETECTED Final   Pseudomonas aeruginosa NOT DETECTED NOT DETECTED Final   Stenotrophomonas maltophilia NOT DETECTED NOT DETECTED Final   Candida albicans NOT DETECTED NOT DETECTED Final   Candida auris NOT DETECTED NOT  DETECTED Final   Candida glabrata NOT DETECTED NOT DETECTED Final   Candida krusei NOT DETECTED NOT DETECTED Final   Candida parapsilosis NOT DETECTED NOT DETECTED Final   Candida tropicalis NOT DETECTED NOT DETECTED Final   Cryptococcus neoformans/gattii NOT DETECTED NOT DETECTED Final   Meth resistant mecA/C and MREJ DETECTED (A) NOT DETECTED Final    Comment: CRITICAL RESULT CALLED TO, READ BACK BY AND VERIFIED WITH: PHARMD EMILY SMarland Kitchen [phone] FCP Performed at [facility], [address] [place], [place] [address]   Urine Culture     Status: Abnormal (Preliminary result)   Collection Time: 04/09/23 12:16 AM   Specimen: Urine, Random  Result Value Ref Range Status   Specimen Description URINE, RANDOM  Final   Special Requests NONE Reflexed from [id number]  Final   Culture (A)  Final    >=100,000 COLONIES/mL PROTEUS MIRABILIS SUSCEPTIBILITIES TO FOLLOW Performed at [facility], [address], [place], [place] [address]    Report Status PENDING  Incomplete  Culture, blood (Routine X 2) w Reflex to ID Panel     Status: None (Preliminary result)   Collection Time: 04/10/23  6:50 AM   Specimen: BLOOD LEFT HAND  Result Value Ref Range Status   Specimen Description BLOOD LEFT HAND  Final   Special Requests   Final    BOTTLES DRAWN AEROBIC AND ANAEROBIC Blood Culture adequate volume   Culture   Final    NO GROWTH 1 DAY Performed at [facility], [address] [place], [place] [address]     Report Status PENDING  Incomplete  Culture, blood (Routine X 2) w Reflex to ID Panel     Status: None (Preliminary result)   Collection Time: 04/10/23  6:50 AM   Specimen: BLOOD LEFT HAND  Result Value Ref Range Status   Specimen Description BLOOD LEFT HAND  Final   Special Requests   Final    BOTTLES DRAWN AEROBIC AND ANAEROBIC Blood Culture adequate volume   Culture   Final    NO GROWTH 1 DAY Performed at [facility], [address], [place], [place] [address]    Report Status PENDING  Incomplete     Marcos Eke, NP [facility]  04/11/2023  9:45 AM

## 2023-04-11 NOTE — Progress Notes (Signed)
Brief Nutrition Note  Micronutrient assessed to determine if a deficiency was contributing to poor wound healing. As labs result, will add replacements as needed.  Micronutrient Profile: CRP 11 (7/24) Vitamin A pending Vitamin C pending Vitamin D 23.23 (low) Vitamin E pending Zinc pending  Intervention: Add 1000 international units of cholecalciferol daily for repletion.   Greig Castilla, RD, LDN Clinical Dietitian RD pager # available in [facility]  After hours/weekend pager # available in [facility]

## 2023-04-11 NOTE — TOC CM/SW Note (Signed)
Transition of Care [facility]) - Inpatient Brief Assessment   Patient Details  Name: Steve [name] MRN: [id number] Date of Birth: 07-28-1991  Transition of Care [facility]) CM/SW Contact:    Mearl Latin, LCSW Phone Number: 04/11/2023, 5:10 PM   Clinical Narrative: Patient admitted from home with spouse. PCP is through the [place]. TOC will follow for needs as he is currently active with [facility].    Transition of Care Asessment: Insurance and Status: Insurance coverage has been reviewed Patient has primary care physician: Yes Home environment has been reviewed: From home Prior level of function:: Independent Prior/Current Home Services: Current home services Social Determinants of Health Reivew: SDOH reviewed no interventions necessary Readmission risk has been reviewed: Yes Transition of care needs: transition of care needs identified, TOC will continue to follow

## 2023-04-11 NOTE — Plan of Care (Signed)

## 2023-04-12 DIAGNOSIS — A419 Sepsis, unspecified organism: Secondary | ICD-10-CM | POA: Diagnosis not present

## 2023-04-12 DIAGNOSIS — R6521 Severe sepsis with septic shock: Secondary | ICD-10-CM | POA: Diagnosis not present

## 2023-04-12 MED ORDER — ACETAMINOPHEN 325 MG PO TABS
650.0000 mg | ORAL_TABLET | Freq: Four times a day (QID) | ORAL | Status: DC | PRN
  Administered 2023-04-12 – 2023-05-02 (×13): 650 mg via ORAL
  Filled 2023-04-12 (×16): qty 2

## 2023-04-12 MED ORDER — PANTOPRAZOLE SODIUM 40 MG PO TBEC
40.0000 mg | DELAYED_RELEASE_TABLET | Freq: Every day | ORAL | Status: DC
  Administered 2023-04-12 – 2023-05-02 (×21): 40 mg via ORAL
  Filled 2023-04-12 (×21): qty 1

## 2023-04-12 MED ORDER — ZINC SULFATE 220 (50 ZN) MG PO CAPS
220.0000 mg | ORAL_CAPSULE | Freq: Every day | ORAL | Status: AC
  Administered 2023-04-12 – 2023-04-23 (×12): 220 mg via ORAL
  Filled 2023-04-12 (×11): qty 1

## 2023-04-12 NOTE — Plan of Care (Signed)

## 2023-04-12 NOTE — Progress Notes (Signed)
[facility]  Date of Admission:  04/08/2023     Total days of antibiotics 5         ASSESSMENT:  Mr. Steve [name] blood cultures remain without growth to date. Tolerating antibiotics with no adverse side effects. Given recent history of drug use not likely a good candidate for PICC line and will need to consider duration of IV treatment which will likely need to be completed within the hospital. Working on disposition of home versus skilled facility.  Last CK level was 110. Continue current dose of daptomycin and ceftriaxone. Remaining medical and supportive care per Internal Medicine.   PLAN:  Continue current dose of daptomycin and ceftriaxone. Monitor cultures for clearance of bacteremia. Wound care per Orthopedics Disposition pending.  Remaining medical and supportive care per Internal Medicine.   Principal Problem:   Septic shock (HCC) Active Problems:   MRSA bacteremia   Amphetamine abuse (HCC)   Septic arthritis (HCC)   Pressure ulcer   Wound infection    ARIPiprazole  15 mg Oral Daily   ascorbic acid  500 mg Oral BID   Chlorhexidine Gluconate Cloth  6 each Topical Daily   cholecalciferol  1,000 Units Oral Daily   feeding supplement  237 mL Oral TID BM   leptospermum manuka honey  1 Application Topical Daily   midodrine  10 mg Oral Q8H   multivitamin with minerals  1 tablet Oral Daily   nutrition supplement (JUVEN)  1 packet Oral BID BM   pantoprazole  40 mg Oral Daily   polyethylene glycol  17 g Oral BID   zinc sulfate  220 mg Oral Daily    SUBJECTIVE:  Afebrile overnight with no acute events. Feeling warm. No new concerns/complaints.   Allergies  Allergen Reactions   Caffeine Anaphylaxis   Chocolate Anaphylaxis   Bactrim [Sulfamethoxazole-Trimethoprim] Other (See Comments)    Reaction not listed.   Tuberculin, Ppd     Other reaction(s): Eruption of skin   Sulfa Antibiotics Rash     Review of Systems: Review of Systems   Constitutional:  Negative for chills, fever and weight loss.  Respiratory:  Negative for cough, shortness of breath and wheezing.   Cardiovascular:  Negative for chest pain and leg swelling.  Gastrointestinal:  Negative for abdominal pain, constipation, diarrhea, nausea and vomiting.  Skin:  Negative for rash.      OBJECTIVE: Vitals:   04/12/23 0041 04/12/23 0321 04/12/23 0520 04/12/23 0746  BP: 91/63 102/60 110/77 112/77  Pulse: 100 95  90  Resp: 20 17 20 18   Temp: 98.1 F (36.7 C) 98 F (36.7 C)  98.9 F (37.2 C)  TempSrc: Oral Oral  Oral  SpO2:    93%  Weight:      Height:       Body mass index is 23.37 kg/m.  Physical Exam Constitutional:      General: He is not in acute distress.    Appearance: He is well-developed.  Cardiovascular:     Rate and Rhythm: Normal rate and regular rhythm.     Heart sounds: Normal heart sounds.  Pulmonary:     Effort: Pulmonary effort is normal.     Breath sounds: Normal breath sounds.  Skin:    General: Skin is warm and dry.  Neurological:     Mental Status: He is alert and oriented to person, place, and time.  Psychiatric:        Behavior: Behavior normal.  Thought Content: Thought content normal.        Judgment: Judgment normal.     Lab Results Lab Results  Component Value Date   WBC 7.3 04/12/2023   HGB 7.4 (L) 04/12/2023   HCT 23.4 (L) 04/12/2023   MCV 87.3 04/12/2023   PLT 248 04/12/2023    Lab Results  Component Value Date   CREATININE 0.54 (L) 04/12/2023   BUN 13 04/12/2023   NA 126 (L) 04/12/2023   K 4.4 04/12/2023   CL 93 (L) 04/12/2023   CO2 26 04/12/2023    Lab Results  Component Value Date   ALT 14 04/08/2023   AST 23 04/08/2023   ALKPHOS 98 04/08/2023   BILITOT 0.4 04/08/2023     Microbiology: Recent Results (from the past 240 hour(s))  Culture, blood (Routine x 2)     Status: None (Preliminary result)   Collection Time: 04/08/23  6:22 PM   Specimen: BLOOD  Result Value Ref Range  Status   Specimen Description BLOOD SITE NOT SPECIFIED  Final   Special Requests   Final    BOTTLES DRAWN AEROBIC ONLY Blood Culture results may not be optimal due to an inadequate volume of blood received in culture bottles   Culture   Final    NO GROWTH 4 DAYS Performed at [facility], [address], [place], [place] [address]    Report Status PENDING  Incomplete  Culture, blood (Routine x 2)     Status: Abnormal (Preliminary result)   Collection Time: 04/08/23  8:08 PM   Specimen: BLOOD  Result Value Ref Range Status   Specimen Description BLOOD SITE NOT SPECIFIED  Final   Special Requests   Final    BOTTLES DRAWN AEROBIC AND ANAEROBIC Blood Culture adequate volume   Culture  Setup Time   Final    GRAM POSITIVE COCCI IN CHAINS IN BOTH AEROBIC AND ANAEROBIC BOTTLES CRITICAL RESULT CALLED TO, READ BACK BY AND VERIFIED WITH: E. SINCLAIR PHARMD, AT 0940 D. VANHOOK GRAM POSITIVE COCCI IN CLUSTERS AEROBIC BOTTLE ONLY CRITICAL RESULT CALLED TO, READ BACK BY AND VERIFIED WITH: PHARMD EMILY S. [id number] FCP    Culture (A)  Final    STAPHYLOCOCCUS AUREUS STREPTOCOCCUS GROUP C PROTEUS MIRABILIS SUSCEPTIBILITIES TO FOLLOW CRITICAL RESULT CALLED TO, READ BACK BY AND VERIFIED WITH: PHARMD E.SINCLAIR AT 0940 ON 04/11/2023 BY T.SAAD. Sent to [facility] for further susceptibility testing. Performed at [facility], [address], [place], [place] [address]    Report Status PENDING  Incomplete  Blood Culture ID Panel (Reflexed)     Status: Abnormal   Collection Time: 04/08/23  8:08 PM  Result Value Ref Range Status   Enterococcus faecalis NOT DETECTED NOT DETECTED Final   Enterococcus Faecium NOT DETECTED NOT DETECTED Final   Listeria monocytogenes NOT DETECTED NOT DETECTED Final   Staphylococcus species NOT DETECTED NOT DETECTED Final   Staphylococcus aureus (BCID) NOT DETECTED NOT DETECTED Final   Staphylococcus epidermidis NOT DETECTED NOT DETECTED Final   Staphylococcus  lugdunensis NOT DETECTED NOT DETECTED Final   Streptococcus species DETECTED (A) NOT DETECTED Final    Comment: Not Enterococcus species, Streptococcus agalactiae, Streptococcus pyogenes, or Streptococcus pneumoniae. CRITICAL RESULT CALLED TO, READ BACK BY AND VERIFIED WITH: E. SINCLAIR PHARMD, AT 0940 D. VANHOOK    Streptococcus agalactiae NOT DETECTED NOT DETECTED Final   Streptococcus pneumoniae NOT DETECTED NOT DETECTED Final   Streptococcus pyogenes NOT DETECTED NOT DETECTED Final   A.calcoaceticus-baumannii NOT  DETECTED NOT DETECTED Final   Bacteroides fragilis NOT DETECTED NOT DETECTED Final   Enterobacterales NOT DETECTED NOT DETECTED Final   Enterobacter cloacae complex NOT DETECTED NOT DETECTED Final   Escherichia coli NOT DETECTED NOT DETECTED Final   Klebsiella aerogenes NOT DETECTED NOT DETECTED Final   Klebsiella oxytoca NOT DETECTED NOT DETECTED Final   Klebsiella pneumoniae NOT DETECTED NOT DETECTED Final   Proteus species NOT DETECTED NOT DETECTED Final   Salmonella species NOT DETECTED NOT DETECTED Final   Serratia marcescens NOT DETECTED NOT DETECTED Final   Haemophilus influenzae NOT DETECTED NOT DETECTED Final   Neisseria meningitidis NOT DETECTED NOT DETECTED Final   Pseudomonas aeruginosa NOT DETECTED NOT DETECTED Final   Stenotrophomonas maltophilia NOT DETECTED NOT DETECTED Final   Candida albicans NOT DETECTED NOT DETECTED Final   Candida auris NOT DETECTED NOT DETECTED Final   Candida glabrata NOT DETECTED NOT DETECTED Final   Candida krusei NOT DETECTED NOT DETECTED Final   Candida parapsilosis NOT DETECTED NOT DETECTED Final   Candida tropicalis NOT DETECTED NOT DETECTED Final   Cryptococcus neoformans/gattii NOT DETECTED NOT DETECTED Final    Comment: Performed at [facility], [address], [place], [place] [address]  Blood Culture ID Panel (Reflexed)     Status: Abnormal   Collection Time: 04/08/23  8:08 PM  Result Value Ref Range Status    Enterococcus faecalis NOT DETECTED NOT DETECTED Final   Enterococcus Faecium NOT DETECTED NOT DETECTED Final   Listeria monocytogenes NOT DETECTED NOT DETECTED Final   Staphylococcus species DETECTED (A) NOT DETECTED Final    Comment: CRITICAL RESULT CALLED TO, READ BACK BY AND VERIFIED WITH: PHARMD EMILY S. 1143 [id number] FCP    Staphylococcus aureus (BCID) DETECTED (A) NOT DETECTED Final    Comment: Methicillin (oxacillin)-resistant Staphylococcus aureus (MRSA). MRSA is predictably resistant to beta-lactam antibiotics (except ceftaroline). Preferred therapy is vancomycin unless clinically contraindicated. Patient requires contact precautions if  hospitalized. CRITICAL RESULT CALLED TO, READ BACK BY AND VERIFIED WITH: PHARMD EMILY S. 1143 [id number] FCP    Staphylococcus epidermidis NOT DETECTED NOT DETECTED Final   Staphylococcus lugdunensis NOT DETECTED NOT DETECTED Final   Streptococcus species DETECTED (A) NOT DETECTED Final    Comment: Not Enterococcus species, Streptococcus agalactiae, Streptococcus pyogenes, or Streptococcus pneumoniae. CRITICAL RESULT CALLED TO, READ BACK BY AND VERIFIED WITH: PHARMD EMILY S. 1143 [id number] FCP    Streptococcus agalactiae NOT DETECTED NOT DETECTED Final   Streptococcus pneumoniae NOT DETECTED NOT DETECTED Final   Streptococcus pyogenes NOT DETECTED NOT DETECTED Final   A.calcoaceticus-baumannii NOT DETECTED NOT DETECTED Final   Bacteroides fragilis NOT DETECTED NOT DETECTED Final   Enterobacterales NOT DETECTED NOT DETECTED Final   Enterobacter cloacae complex NOT DETECTED NOT DETECTED Final   Escherichia coli NOT DETECTED NOT DETECTED Final   Klebsiella aerogenes NOT DETECTED NOT DETECTED Final   Klebsiella oxytoca NOT DETECTED NOT DETECTED Final   Klebsiella pneumoniae NOT DETECTED NOT DETECTED Final   Proteus species NOT DETECTED NOT DETECTED Final   Salmonella species NOT DETECTED NOT DETECTED Final   Serratia marcescens NOT DETECTED NOT DETECTED  Final   Haemophilus influenzae NOT DETECTED NOT DETECTED Final   Neisseria meningitidis NOT DETECTED NOT DETECTED Final   Pseudomonas aeruginosa NOT DETECTED NOT DETECTED Final   Stenotrophomonas maltophilia NOT DETECTED NOT DETECTED Final   Candida albicans NOT DETECTED NOT DETECTED Final   Candida auris NOT DETECTED NOT DETECTED Final   Candida glabrata NOT DETECTED NOT DETECTED Final  Candida krusei NOT DETECTED NOT DETECTED Final   Candida parapsilosis NOT DETECTED NOT DETECTED Final   Candida tropicalis NOT DETECTED NOT DETECTED Final   Cryptococcus neoformans/gattii NOT DETECTED NOT DETECTED Final   Meth resistant mecA/C and MREJ DETECTED (A) NOT DETECTED Final    Comment: CRITICAL RESULT CALLED TO, READ BACK BY AND VERIFIED WITH: PHARMD EMILY SMarland Kitchen [phone] FCP Performed at [facility], [address], [place], [place] [address]   Urine Culture     Status: Abnormal (Preliminary result)   Collection Time: 04/09/23 12:16 AM   Specimen: Urine, Random  Result Value Ref Range Status   Specimen Description URINE, RANDOM  Final   Special Requests   Final    NONE Reflexed from [id number] Performed at [facility], [address], [place], [place] [address]    Culture (A)  Final    >=100,000 COLONIES/mL PROTEUS MIRABILIS 30,000 COLONIES/mL ESCHERICHIA COLI    Report Status PENDING  Incomplete   Organism ID, Bacteria ESCHERICHIA COLI (A)  Final   Organism ID, Bacteria PROTEUS MIRABILIS (A)  Final      Susceptibility   Escherichia coli - MIC*    AMPICILLIN >=32 RESISTANT Resistant     CEFAZOLIN <=4 SENSITIVE Sensitive     CEFEPIME <=0.12 SENSITIVE Sensitive     CEFTRIAXONE <=0.25 SENSITIVE Sensitive     CIPROFLOXACIN >=4 RESISTANT Resistant     GENTAMICIN <=1 SENSITIVE Sensitive     IMIPENEM <=0.25 SENSITIVE Sensitive     NITROFURANTOIN <=16 SENSITIVE Sensitive     TRIMETH/SULFA >=320 RESISTANT Resistant     AMPICILLIN/SULBACTAM 4 SENSITIVE Sensitive     PIP/TAZO  <=4 SENSITIVE Sensitive     * 30,000 COLONIES/mL ESCHERICHIA COLI   Proteus mirabilis - MIC*    AMPICILLIN 16 INTERMEDIATE Intermediate     CEFAZOLIN <=4 SENSITIVE Sensitive     CEFEPIME <=0.12 SENSITIVE Sensitive     CEFTRIAXONE <=0.25 SENSITIVE Sensitive     CIPROFLOXACIN <=0.25 SENSITIVE Sensitive     GENTAMICIN <=1 SENSITIVE Sensitive     IMIPENEM 1 SENSITIVE Sensitive     NITROFURANTOIN 128 RESISTANT Resistant     TRIMETH/SULFA <=20 SENSITIVE Sensitive     AMPICILLIN/SULBACTAM 8 SENSITIVE Sensitive     * >=100,000 COLONIES/mL PROTEUS MIRABILIS  Culture, blood (Routine X 2) w Reflex to ID Panel     Status: None (Preliminary result)   Collection Time: 04/10/23  6:50 AM   Specimen: BLOOD LEFT HAND  Result Value Ref Range Status   Specimen Description BLOOD LEFT HAND  Final   Special Requests   Final    BOTTLES DRAWN AEROBIC AND ANAEROBIC Blood Culture adequate volume   Culture   Final    NO GROWTH 2 DAYS Performed at [facility], [address], [place], [place] [address]    Report Status PENDING  Incomplete  Culture, blood (Routine X 2) w Reflex to ID Panel     Status: None (Preliminary result)   Collection Time: 04/10/23  6:50 AM   Specimen: BLOOD LEFT HAND  Result Value Ref Range Status   Specimen Description BLOOD LEFT HAND  Final   Special Requests   Final    BOTTLES DRAWN AEROBIC AND ANAEROBIC Blood Culture adequate volume   Culture   Final    NO GROWTH 2 DAYS Performed at [facility], [address], [place], [place] [address]    Report Status PENDING  Incomplete     Tammy Sours  Carver Fila, NP [facility]  04/12/2023  11:54 AM

## 2023-04-12 NOTE — Progress Notes (Signed)
Brief Nutrition Note  Micronutrient assessed to determine if a deficiency was contributing to poor wound healing. As labs result, will add replacements as needed.  Micronutrient Profile: CRP 11 (7/24) Vitamin A pending Vitamin C pending Vitamin D 23.23 (low) Vitamin E pending Zinc 31 (low)  Intervention: 1000 international units of cholecalciferol daily for repletion x 30 days 220mg  of Zinc Sulfate x 14 days   Greig Castilla, RD, LDN Clinical Dietitian RD pager # available in [facility]  After hours/weekend pager # available in [facility]

## 2023-04-12 NOTE — Progress Notes (Addendum)
PROGRESS NOTE    Steve [name]  VZD:[id number] DOB: 1990/11/01 DOA: 04/08/2023 PCP: [facility]   Brief Narrative:  32 year old male with past medical history as below which is significant for paraplegia, substance abuse issues, psychostimulant induced psychosis, neurogenic bladder, chronic hypotension on midodrine, chronic osteomyelitis, paranoid schizophrenia, and PTSD. He receives most of his medical care through the [place] where he was recently admitted for infections of bilateral ischial wounds.   Presents to the emergency department on 7/22 with complaints of weakness and malaise. Upon arrival arrival to the emergency department he was noted to have purulent drainage from his bilateral pressure wounds as well as abdominal pain. Imaging revealed concern for left hip septic arthritis/osteomyelitis.  Patient had hypotension refractory to fluids and admitted to the ICU with concerns for septic shock in the setting of septic arthritis/osteomyelitis of the left hip.   Assessment & Plan:   Principal Problem:   Septic shock (HCC) Active Problems:   Amphetamine abuse (HCC)   MRSA bacteremia   Septic arthritis (HCC)   Pressure ulcer   Wound infection   Acute on chronic osteomyelitis septic arthritis of the left hip, septic shock, POA  polymicrobial with MRSA, Group C Streptococcus and Proteus mirabilis bacteremia Proteus mirabilis UTI -Antibiotics management per ID, vancomycin changed to daptomycin, Rocephin has been added to cover for Proteus bacteremia/UTI -Continue with wound care -ID following, vancomycin for osteomyelitis, ceftriaxone can be stopped for presumed UTI, questionable streptococcal bacteremia, TTE unremarkable, repeat cultures sent, no need for TEE at this time. -Orthopedics following, no indication for procedure, and recommendation for prolonged antibiotic course  -Following surveillance blood cultures, so far remains negative, will need total of 6 weeks of IV  antibiotics    Acute on chronic normocytic anemia  -Transfuse as needed.   GERD -Resume home PPI   Hyponatremia Hypokalemia Hypomagnesemia Non-anion gap metabolic acidosis -Continue LR, follow repeat labs, replete electrolytes as necessary   Neurogenic bladder/UTI Paraplegia -Continue Foley, urine culture growing Proteus, continue with Rocephin   Schizophrenia Substance abuse  **Home med rec continues to be incomplete, records have been requested from the [place], will start Abilify at prior reported dose to ensure no lag in medications. -Self reports use of methamphetamines self-reports use of methamphetamines  Pressure ulcers -Continue with wound care, started on vitamin C and zinc Pressure Injury 05/30/21 Ankle Left;Lateral Unstageable - Full thickness tissue loss in which the base of the injury is covered by slough (yellow, tan, gray, green or brown) and/or eschar (tan, brown or black) in the wound bed. (Active)  05/30/21 1938  Location: Ankle  Location Orientation: Left;Lateral  Staging: Unstageable - Full thickness tissue loss in which the base of the injury is covered by slough (yellow, tan, gray, green or brown) and/or eschar (tan, brown or black) in the wound bed.  Wound Description (Comments):   Present on Admission: Yes     Pressure Injury 08/29/21 Ischial tuberosity Left Stage 4 - Full thickness tissue loss with exposed bone, tendon or muscle. [id number] (Active)  08/29/21 0100  Location: Ischial tuberosity  Location Orientation: Left  Staging: Stage 4 - Full thickness tissue loss with exposed bone, tendon or muscle.  Wound Description (Comments): [id number]  Present on Admission: Yes     Pressure Injury 04/09/23 Pretibial Right;Lateral Stage 2 -  Partial thickness loss of dermis presenting as a shallow open injury with a red, pink wound bed without slough. full thickness, NOT a pressure injury (Active)  04/09/23 0351  Location:  Pretibial  Location Orientation: Right;Lateral   Staging: Stage 2 -  Partial thickness loss of dermis presenting as a shallow open injury with a red, pink wound bed without slough.  Wound Description (Comments): full thickness, NOT a pressure injury  Present on Admission: Yes     Pressure Injury 04/09/23 Ischial tuberosity Right Stage 4 - Full thickness tissue loss with exposed bone, tendon or muscle. (Active)  04/09/23 0100  Location: Ischial tuberosity  Location Orientation: Right  Staging: Stage 4 - Full thickness tissue loss with exposed bone, tendon or muscle.  Wound Description (Comments):   Present on Admission: Yes     Pressure Injury 04/09/23 Elbow Left;Posterior Stage 2 -  Partial thickness loss of dermis presenting as a shallow open injury with a red, pink wound bed without slough. (Active)  04/09/23 0330  Location: Elbow  Location Orientation: Left;Posterior  Staging: Stage 2 -  Partial thickness loss of dermis presenting as a shallow open injury with a red, pink wound bed without slough.  Wound Description (Comments):   Present on Admission: Yes      DVT prophylaxis: SCDs Start: 04/09/23 0140   Code Status:   Code Status: Full Code  Family Communication: None at bedside  Status is: Inpatient  Dispo: The patient is from: Home              Anticipated d/c is to: To be determined              Anticipated d/c date is: 72+ hours              Patient currently not medically stable for discharge  Consultants:  PCCM, ID, orthopedic  Procedures:  None    Subjective:  No significant events overnight as discussed with staff, he reports generalized pain  Objective: Vitals:   04/12/23 0041 04/12/23 0321 04/12/23 0520 04/12/23 0746  BP: 91/63 102/60 110/77 112/77  Pulse: 100 95  90  Resp: 20 17 20 18   Temp: 98.1 F (36.7 C) 98 F (36.7 C)  98.9 F (37.2 C)  TempSrc: Oral Oral  Oral  SpO2:    93%  Weight:      Height:        Intake/Output Summary (Last 24 hours) at 04/12/2023 1437 Last data filed at  04/12/2023 0758 Gross per 24 hour  Intake 461.92 ml  Output 2500 ml  Net -2038.08 ml   Filed Weights   04/09/23 0200 04/10/23 0704 04/11/23 0400  Weight: 72.6 kg 72.1 kg 76 kg    Examination:   Awake Alert, Oriented X 3, flat affect, chronic ill-appearing Symmetrical Chest wall movement, Good air movement bilaterally, CTAB RRR,No Gallops,Rubs or new Murmurs, No Parasternal Heave +ve B.Sounds, Abd Soft, No tenderness, No rebound - guarding or rigidity. Dressing apply to his pressure ulcers   Data Reviewed: I have personally reviewed following labs and imaging studies  CBC: Recent Labs  Lab 04/08/23 2008 04/09/23 1610 04/09/23 1537 04/10/23 0650 04/11/23 0606 04/12/23 0540  WBC 10.6* 8.2  --  6.6 8.3 7.3  NEUTROABS 9.3*  --   --  5.2  --   --   HGB 6.3* 6.6* 6.8* 8.1* 8.0* 7.4*  HCT 20.8* 21.4* 21.0* 24.5* 25.5* 23.4*  MCV 87.0 87.7  --  85.4 85.6 87.3  PLT 371 307  --  253 305 248   Basic Metabolic Panel: Recent Labs  Lab 04/08/23 1822 04/09/23 0637 04/10/23 0650 04/12/23 0540  NA 129* 126* 128* 126*  K  3.6 3.2* 4.4 4.4  CL 97* 98 100 93*  CO2 20* 18* 22 26  GLUCOSE 100* 123* 97 99  BUN 9 8 <5* 13  CREATININE 0.83 0.71 0.51* 0.54*  CALCIUM 7.6* 7.1* 7.1* 7.5*  MG  --  1.4*  --   --   PHOS  --  3.3  --  3.9   GFR: Estimated Creatinine Clearance: 139.9 mL/min (A) (by C-G formula based on SCr of 0.54 mg/dL (L)). Liver Function Tests: Recent Labs  Lab 04/08/23 1822  AST 23  ALT 14  ALKPHOS 98  BILITOT 0.4  PROT 7.2  ALBUMIN <1.5*   Recent Labs  Lab 04/11/23 0606  LIPASE 26   No results for input(s): "AMMONIA" in the last 168 hours. Coagulation Profile: Recent Labs  Lab 04/08/23 1822 04/10/23 0650  INR 1.3* 1.2   Cardiac Enzymes: Recent Labs  Lab 04/12/23 0540  CKTOTAL 110   BNP (last 3 results) No results for input(s): "PROBNP" in the last 8760 hours. HbA1C: No results for input(s): "HGBA1C" in the last 72 hours. CBG: Recent Labs   Lab 04/09/23 1929 04/09/23 2322 04/10/23 0313 04/10/23 0715 04/10/23 1121  GLUCAP 114* 109* 93 92 122*   Lipid Profile: No results for input(s): "CHOL", "HDL", "LDLCALC", "TRIG", "CHOLHDL", "LDLDIRECT" in the last 72 hours. Thyroid Function Tests: No results for input(s): "TSH", "T4TOTAL", "FREET4", "T3FREE", "THYROIDAB" in the last 72 hours. Anemia Panel: Recent Labs    04/10/23 0650  FERRITIN 795*  TIBC 99*  IRON 15*   Sepsis Labs: Recent Labs  Lab 04/08/23 1828 04/08/23 2008 04/08/23 2219  LATICACIDVEN 2.8* 2.4* 2.1*    Recent Results (from the past 240 hour(s))  Culture, blood (Routine x 2)     Status: None (Preliminary result)   Collection Time: 04/08/23  6:22 PM   Specimen: BLOOD  Result Value Ref Range Status   Specimen Description BLOOD SITE NOT SPECIFIED  Final   Special Requests   Final    BOTTLES DRAWN AEROBIC ONLY Blood Culture results may not be optimal due to an inadequate volume of blood received in culture bottles   Culture   Final    NO GROWTH 4 DAYS Performed at [facility], [address] [place], [place] [address]    Report Status PENDING  Incomplete  Culture, blood (Routine x 2)     Status: Abnormal (Preliminary result)   Collection Time: 04/08/23  8:08 PM   Specimen: BLOOD  Result Value Ref Range Status   Specimen Description BLOOD SITE NOT SPECIFIED  Final   Special Requests   Final    BOTTLES DRAWN AEROBIC AND ANAEROBIC Blood Culture adequate volume   Culture  Setup Time   Final    GRAM POSITIVE COCCI IN CHAINS IN BOTH AEROBIC AND ANAEROBIC BOTTLES CRITICAL RESULT CALLED TO, READ BACK BY AND VERIFIED WITH: E. SINCLAIR PHARMD, AT 0940 D. VANHOOK GRAM POSITIVE COCCI IN CLUSTERS AEROBIC BOTTLE ONLY CRITICAL RESULT CALLED TO, READ BACK BY AND VERIFIED WITH: PHARMD EMILY S. [id number] FCP    Culture (A)  Final    STAPHYLOCOCCUS AUREUS STREPTOCOCCUS GROUP C PROTEUS MIRABILIS SUSCEPTIBILITIES TO FOLLOW CRITICAL RESULT CALLED  TO, READ BACK BY AND VERIFIED WITH: PHARMD E.SINCLAIR AT 0940 ON 04/11/2023 BY T.SAAD. Sent to [facility] for further susceptibility testing. Performed at [facility], [address] [place], [place] [address]    Report Status PENDING  Incomplete  Blood Culture ID Panel (Reflexed)  Status: Abnormal   Collection Time: 04/08/23  8:08 PM  Result Value Ref Range Status   Enterococcus faecalis NOT DETECTED NOT DETECTED Final   Enterococcus Faecium NOT DETECTED NOT DETECTED Final   Listeria monocytogenes NOT DETECTED NOT DETECTED Final   Staphylococcus species NOT DETECTED NOT DETECTED Final   Staphylococcus aureus (BCID) NOT DETECTED NOT DETECTED Final   Staphylococcus epidermidis NOT DETECTED NOT DETECTED Final   Staphylococcus lugdunensis NOT DETECTED NOT DETECTED Final   Streptococcus species DETECTED (A) NOT DETECTED Final    Comment: Not Enterococcus species, Streptococcus agalactiae, Streptococcus pyogenes, or Streptococcus pneumoniae. CRITICAL RESULT CALLED TO, READ BACK BY AND VERIFIED WITH: E. SINCLAIR PHARMD, AT 0940 D. VANHOOK    Streptococcus agalactiae NOT DETECTED NOT DETECTED Final   Streptococcus pneumoniae NOT DETECTED NOT DETECTED Final   Streptococcus pyogenes NOT DETECTED NOT DETECTED Final   A.calcoaceticus-baumannii NOT DETECTED NOT DETECTED Final   Bacteroides fragilis NOT DETECTED NOT DETECTED Final   Enterobacterales NOT DETECTED NOT DETECTED Final   Enterobacter cloacae complex NOT DETECTED NOT DETECTED Final   Escherichia coli NOT DETECTED NOT DETECTED Final   Klebsiella aerogenes NOT DETECTED NOT DETECTED Final   Klebsiella oxytoca NOT DETECTED NOT DETECTED Final   Klebsiella pneumoniae NOT DETECTED NOT DETECTED Final   Proteus species NOT DETECTED NOT DETECTED Final   Salmonella species NOT DETECTED NOT DETECTED Final   Serratia marcescens NOT DETECTED NOT DETECTED Final   Haemophilus influenzae NOT DETECTED NOT DETECTED Final   Neisseria  meningitidis NOT DETECTED NOT DETECTED Final   Pseudomonas aeruginosa NOT DETECTED NOT DETECTED Final   Stenotrophomonas maltophilia NOT DETECTED NOT DETECTED Final   Candida albicans NOT DETECTED NOT DETECTED Final   Candida auris NOT DETECTED NOT DETECTED Final   Candida glabrata NOT DETECTED NOT DETECTED Final   Candida krusei NOT DETECTED NOT DETECTED Final   Candida parapsilosis NOT DETECTED NOT DETECTED Final   Candida tropicalis NOT DETECTED NOT DETECTED Final   Cryptococcus neoformans/gattii NOT DETECTED NOT DETECTED Final    Comment: Performed at [facility], [address] [place], [place] [address]  Blood Culture ID Panel (Reflexed)     Status: Abnormal   Collection Time: 04/08/23  8:08 PM  Result Value Ref Range Status   Enterococcus faecalis NOT DETECTED NOT DETECTED Final   Enterococcus Faecium NOT DETECTED NOT DETECTED Final   Listeria monocytogenes NOT DETECTED NOT DETECTED Final   Staphylococcus species DETECTED (A) NOT DETECTED Final    Comment: CRITICAL RESULT CALLED TO, READ BACK BY AND VERIFIED WITH: PHARMD EMILY S. [id number] FCP    Staphylococcus aureus (BCID) DETECTED (A) NOT DETECTED Final    Comment: Methicillin (oxacillin)-resistant Staphylococcus aureus (MRSA). MRSA is predictably resistant to beta-lactam antibiotics (except ceftaroline). Preferred therapy is vancomycin unless clinically contraindicated. Patient requires contact precautions if  hospitalized. CRITICAL RESULT CALLED TO, READ BACK BY AND VERIFIED WITH: PHARMD EMILY S. [phone] FCP    Staphylococcus epidermidis NOT DETECTED NOT DETECTED Final   Staphylococcus lugdunensis NOT DETECTED NOT DETECTED Final   Streptococcus species DETECTED (A) NOT DETECTED Final    Comment: Not Enterococcus species, Streptococcus agalactiae, Streptococcus pyogenes, or Streptococcus pneumoniae. CRITICAL RESULT CALLED TO, READ BACK BY AND VERIFIED WITH: PHARMD EMILY S. [phone] [id number] FCP    Streptococcus  agalactiae NOT DETECTED NOT DETECTED Final   Streptococcus pneumoniae NOT DETECTED NOT DETECTED Final   Streptococcus pyogenes NOT DETECTED NOT DETECTED Final   A.calcoaceticus-baumannii NOT DETECTED NOT DETECTED Final  Bacteroides fragilis NOT DETECTED NOT DETECTED Final   Enterobacterales NOT DETECTED NOT DETECTED Final   Enterobacter cloacae complex NOT DETECTED NOT DETECTED Final   Escherichia coli NOT DETECTED NOT DETECTED Final   Klebsiella aerogenes NOT DETECTED NOT DETECTED Final   Klebsiella oxytoca NOT DETECTED NOT DETECTED Final   Klebsiella pneumoniae NOT DETECTED NOT DETECTED Final   Proteus species NOT DETECTED NOT DETECTED Final   Salmonella species NOT DETECTED NOT DETECTED Final   Serratia marcescens NOT DETECTED NOT DETECTED Final   Haemophilus influenzae NOT DETECTED NOT DETECTED Final   Neisseria meningitidis NOT DETECTED NOT DETECTED Final   Pseudomonas aeruginosa NOT DETECTED NOT DETECTED Final   Stenotrophomonas maltophilia NOT DETECTED NOT DETECTED Final   Candida albicans NOT DETECTED NOT DETECTED Final   Candida auris NOT DETECTED NOT DETECTED Final   Candida glabrata NOT DETECTED NOT DETECTED Final   Candida krusei NOT DETECTED NOT DETECTED Final   Candida parapsilosis NOT DETECTED NOT DETECTED Final   Candida tropicalis NOT DETECTED NOT DETECTED Final   Cryptococcus neoformans/gattii NOT DETECTED NOT DETECTED Final   Meth resistant mecA/C and MREJ DETECTED (A) NOT DETECTED Final    Comment: CRITICAL RESULT CALLED TO, READ BACK BY AND VERIFIED WITH: PHARMD EMILY S. [phone] [id number] FCP Performed at [facility], [address] [place], [place] [address]   Urine Culture     Status: Abnormal (Preliminary result)   Collection Time: 04/09/23 12:16 AM   Specimen: Urine, Random  Result Value Ref Range Status   Specimen Description URINE, RANDOM  Final   Special Requests   Final    NONE Reflexed from [id number] Performed at [facility], [address], [place], [place] [address]    Culture (A)  Final    >=100,000 COLONIES/mL PROTEUS MIRABILIS 30,000 COLONIES/mL ESCHERICHIA COLI    Report Status PENDING  Incomplete   Organism ID, Bacteria ESCHERICHIA COLI (A)  Final   Organism ID, Bacteria PROTEUS MIRABILIS (A)  Final      Susceptibility   Escherichia coli - MIC*    AMPICILLIN >=32 RESISTANT Resistant     CEFAZOLIN <=4 SENSITIVE Sensitive     CEFEPIME <=0.12 SENSITIVE Sensitive     CEFTRIAXONE <=0.25 SENSITIVE Sensitive     CIPROFLOXACIN >=4 RESISTANT Resistant     GENTAMICIN <=1 SENSITIVE Sensitive     IMIPENEM <=0.25 SENSITIVE Sensitive     NITROFURANTOIN <=16 SENSITIVE Sensitive     TRIMETH/SULFA >=320 RESISTANT Resistant     AMPICILLIN/SULBACTAM 4 SENSITIVE Sensitive     PIP/TAZO <=4 SENSITIVE Sensitive     * 30,000 COLONIES/mL ESCHERICHIA COLI   Proteus mirabilis - MIC*    AMPICILLIN 16 INTERMEDIATE Intermediate     CEFAZOLIN <=4 SENSITIVE Sensitive     CEFEPIME <=0.12 SENSITIVE Sensitive     CEFTRIAXONE <=0.25 SENSITIVE Sensitive     CIPROFLOXACIN <=0.25 SENSITIVE Sensitive     GENTAMICIN <=1 SENSITIVE Sensitive     IMIPENEM 1 SENSITIVE Sensitive     NITROFURANTOIN 128 RESISTANT Resistant     TRIMETH/SULFA <=20 SENSITIVE Sensitive     AMPICILLIN/SULBACTAM 8 SENSITIVE Sensitive     * >=100,000 COLONIES/mL PROTEUS MIRABILIS  Culture, blood (Routine X 2) w Reflex to ID Panel     Status: None (Preliminary result)   Collection Time: 04/10/23  6:50 AM   Specimen: BLOOD LEFT HAND  Result Value Ref Range Status   Specimen Description BLOOD LEFT HAND  Final   Special Requests  Final    BOTTLES DRAWN AEROBIC AND ANAEROBIC Blood Culture adequate volume   Culture   Final    NO GROWTH 2 DAYS Performed at [facility], [address], [place], [place] [address]    Report Status PENDING  Incomplete  Culture, blood (Routine X 2) w Reflex to ID Panel     Status: None (Preliminary result)   Collection Time: 04/10/23  6:50  AM   Specimen: BLOOD LEFT HAND  Result Value Ref Range Status   Specimen Description BLOOD LEFT HAND  Final   Special Requests   Final    BOTTLES DRAWN AEROBIC AND ANAEROBIC Blood Culture adequate volume   Culture   Final    NO GROWTH 2 DAYS Performed at [facility], [address], [place], [place] [address]    Report Status PENDING  Incomplete         Radiology Studies: No results found.   Scheduled Meds:  ARIPiprazole  15 mg Oral Daily   ascorbic acid  500 mg Oral BID   Chlorhexidine Gluconate Cloth  6 each Topical Daily   cholecalciferol  1,000 Units Oral Daily   feeding supplement  237 mL Oral TID BM   leptospermum manuka honey  1 Application Topical Daily   midodrine  10 mg Oral Q8H   multivitamin with minerals  1 tablet Oral Daily   nutrition supplement (JUVEN)  1 packet Oral BID BM   pantoprazole  40 mg Oral Daily   polyethylene glycol  17 g Oral BID   zinc sulfate  220 mg Oral Daily   Continuous Infusions:  cefTRIAXone (ROCEPHIN)  IV 2 g (04/12/23 1200)   DAPTOmycin (CUBICIN) 600 mg in sodium chloride 0.9 % IVPB Stopped (04/11/23 1203)     LOS: 3 days   Huey Bienenstock, MD [facility]  If 7PM-7AM, please contact night-coverage [email or web address]  04/12/2023, 2:37 PM

## 2023-04-13 ENCOUNTER — Other Ambulatory Visit: Payer: Self-pay

## 2023-04-13 DIAGNOSIS — M0009 Staphylococcal polyarthritis: Secondary | ICD-10-CM | POA: Diagnosis not present

## 2023-04-13 DIAGNOSIS — R6521 Severe sepsis with septic shock: Secondary | ICD-10-CM | POA: Diagnosis not present

## 2023-04-13 DIAGNOSIS — L89150 Pressure ulcer of sacral region, unstageable: Secondary | ICD-10-CM | POA: Diagnosis not present

## 2023-04-13 DIAGNOSIS — A419 Sepsis, unspecified organism: Secondary | ICD-10-CM | POA: Diagnosis not present

## 2023-04-13 MED ORDER — SUCRALFATE 1 GM/10ML PO SUSP
1.0000 g | Freq: Three times a day (TID) | ORAL | Status: AC
  Administered 2023-04-13 – 2023-04-16 (×9): 1 g via ORAL
  Filled 2023-04-13 (×12): qty 10

## 2023-04-13 MED ORDER — ALBUMIN HUMAN 25 % IV SOLN
25.0000 g | Freq: Four times a day (QID) | INTRAVENOUS | Status: AC
  Administered 2023-04-13 – 2023-04-14 (×4): 25 g via INTRAVENOUS
  Filled 2023-04-13 (×4): qty 100

## 2023-04-13 MED ORDER — NYSTATIN 100000 UNIT/GM EX POWD
Freq: Three times a day (TID) | CUTANEOUS | Status: DC
  Administered 2023-04-19: 1 via TOPICAL
  Filled 2023-04-13: qty 15

## 2023-04-13 MED ORDER — SODIUM CHLORIDE 1 G PO TABS
2.0000 g | ORAL_TABLET | Freq: Three times a day (TID) | ORAL | Status: AC
  Administered 2023-04-13 – 2023-04-15 (×6): 2 g via ORAL
  Filled 2023-04-13 (×6): qty 2

## 2023-04-13 MED ORDER — ENOXAPARIN SODIUM 40 MG/0.4ML IJ SOSY
40.0000 mg | PREFILLED_SYRINGE | INTRAMUSCULAR | Status: DC
  Administered 2023-04-13: 40 mg via SUBCUTANEOUS
  Filled 2023-04-13: qty 0.4

## 2023-04-13 MED ORDER — CALCIUM CARBONATE ANTACID 500 MG PO CHEW
1.0000 | CHEWABLE_TABLET | Freq: Three times a day (TID) | ORAL | Status: DC | PRN
  Administered 2023-04-13 – 2023-04-14 (×2): 200 mg via ORAL
  Filled 2023-04-13 (×2): qty 1
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
## 2023-04-13 NOTE — Progress Notes (Signed)
PROGRESS NOTE    Steve [name]  MVH:[id number] DOB: 10-09-1990 DOA: 04/08/2023 PCP: [facility]   Brief Narrative:  32 year old male with past medical history as below which is significant for paraplegia, substance abuse issues, psychostimulant induced psychosis, neurogenic bladder, chronic hypotension on midodrine, chronic osteomyelitis, paranoid schizophrenia, and PTSD. He receives most of his medical care through the [place] where he was recently admitted for infections of bilateral ischial wounds.   Presents to the emergency department on 7/22 with complaints of weakness and malaise. Upon arrival arrival to the emergency department he was noted to have purulent drainage from his bilateral pressure wounds as well as abdominal pain. Imaging revealed concern for left hip septic arthritis/osteomyelitis.  Patient had hypotension refractory to fluids and admitted to the ICU with concerns for septic shock in the setting of septic arthritis/osteomyelitis of the left hip.   Assessment & Plan:   Principal Problem:   Septic shock (HCC) Active Problems:   Amphetamine abuse (HCC)   MRSA bacteremia   Septic arthritis (HCC)   Pressure ulcer   Wound infection   Acute on chronic osteomyelitis septic arthritis of the left hip, septic shock, POA  polymicrobial with MRSA, Group C Streptococcus and Proteus mirabilis bacteremia Proteus mirabilis UTI -Antibiotics management per ID, vancomycin changed to daptomycin, Rocephin has been added to cover for Proteus bacteremia/UTI -Continue with wound care -ID following, vancomycin for osteomyelitis, ceftriaxone can be stopped for presumed UTI, questionable streptococcal bacteremia, TTE unremarkable, repeat cultures sent, no need for TEE at this time. -Orthopedics following, no indication for procedure, and recommendation for prolonged antibiotic course  -Following surveillance blood cultures, so far remains negative, will need total of 6 weeks of IV  antibiotics, will request PICC line insertion.   Acute on chronic normocytic anemia  -Transfuse as needed.  Hypotension -blood pressure remains soft, continue with midodrine, will add albumin given extremely low albumin at less than 1.5  hyponatremia -With significant fluid intake, and low solute intake, will put on fluid restrictions and and started on salt tablets   GERD -Resume home PPI   Hyponatremia Hypokalemia Hypomagnesemia Non-anion gap metabolic acidosis -Continue LR, follow repeat labs, replete electrolytes as necessary   Neurogenic bladder/UTI Paraplegia -Continue Foley, urine culture growing Proteus, continue with Rocephin   Schizophrenia Substance abuse  **Home med rec continues to be incomplete, records have been requested from the [place], will start Abilify at prior reported dose to ensure no lag in medications. -Self reports use of methamphetamines self-reports use of methamphetamines  Pressure ulcers -Continue with wound care, started on vitamin C and zinc Pressure Injury 05/30/21 Ankle Left;Lateral Unstageable - Full thickness tissue loss in which the base of the injury is covered by slough (yellow, tan, gray, green or brown) and/or eschar (tan, brown or black) in the wound bed. (Active)  05/30/21 1938  Location: Ankle  Location Orientation: Left;Lateral  Staging: Unstageable - Full thickness tissue loss in which the base of the injury is covered by slough (yellow, tan, gray, green or brown) and/or eschar (tan, brown or black) in the wound bed.  Wound Description (Comments):   Present on Admission: Yes     Pressure Injury 08/29/21 Ischial tuberosity Left Stage 4 - Full thickness tissue loss with exposed bone, tendon or muscle. 8U1L2 (Active)  08/29/21 0100  Location: Ischial tuberosity  Location Orientation: Left  Staging: Stage 4 - Full thickness tissue loss with exposed bone, tendon or muscle.  Wound Description (Comments): [id number]  Present on Admission:  Yes      Pressure Injury 04/09/23 Pretibial Right;Lateral Stage 2 -  Partial thickness loss of dermis presenting as a shallow open injury with a red, pink wound bed without slough. full thickness, NOT a pressure injury (Active)  04/09/23 0351  Location: Pretibial  Location Orientation: Right;Lateral  Staging: Stage 2 -  Partial thickness loss of dermis presenting as a shallow open injury with a red, pink wound bed without slough.  Wound Description (Comments): full thickness, NOT a pressure injury  Present on Admission: Yes     Pressure Injury 04/09/23 Ischial tuberosity Right Stage 4 - Full thickness tissue loss with exposed bone, tendon or muscle. (Active)  04/09/23 0100  Location: Ischial tuberosity  Location Orientation: Right  Staging: Stage 4 - Full thickness tissue loss with exposed bone, tendon or muscle.  Wound Description (Comments):   Present on Admission: Yes     Pressure Injury 04/09/23 Elbow Left;Posterior Stage 2 -  Partial thickness loss of dermis presenting as a shallow open injury with a red, pink wound bed without slough. (Active)  04/09/23 0330  Location: Elbow  Location Orientation: Left;Posterior  Staging: Stage 2 -  Partial thickness loss of dermis presenting as a shallow open injury with a red, pink wound bed without slough.  Wound Description (Comments):   Present on Admission: Yes      DVT prophylaxis: SCDs Start: 04/09/23 0140   Code Status:   Code Status: Full Code  Family Communication: None at bedside  Status is: Inpatient  Dispo: The patient is from: Home              Anticipated d/c is to: To be determined              Anticipated d/c date is: 72+ hours              Patient currently not medically stable for discharge  Consultants:  PCCM, ID, orthopedic  Procedures:  None    Subjective:  Complains of some generalized achiness, he reports some nausea  Objective: Vitals:   04/13/23 1206 04/13/23 1209 04/13/23 1222 04/13/23 1324  BP:  (!) 83/41 (!) 84/48 (!) 84/48 (!) 90/54  Pulse:  99 (!) 109   Resp: (!) 24 (!) 27 (!) 29 (!) 22  Temp: 99.2 F (37.3 C)  99.2 F (37.3 C)   TempSrc:  Oral    SpO2:  97% 99%   Weight:      Height:        Intake/Output Summary (Last 24 hours) at 04/13/2023 1448 Last data filed at 04/13/2023 0021 Gross per 24 hour  Intake 360 ml  Output 2000 ml  Net -1640 ml   Filed Weights   04/09/23 0200 04/10/23 0704 04/11/23 0400  Weight: 72.6 kg 72.1 kg 76 kg    Examination:   Awake Alert, Oriented X 3, chronically ill-appearing Symmetrical Chest wall movement, Good air movement bilaterally, CTAB RRR,No Gallops,Rubs or new Murmurs, No Parasternal Heave +ve B.Sounds, Abd Soft, No tenderness, No rebound - guarding or rigidity. Dressing apply to his pressure ulcers   Data Reviewed: I have personally reviewed following labs and imaging studies  CBC: Recent Labs  Lab 04/08/23 2008 04/09/23 7425 04/09/23 1537 04/10/23 0650 04/11/23 0606 04/12/23 0540 04/13/23 0435  WBC 10.6* 8.2  --  6.6 8.3 7.3 7.6  NEUTROABS 9.3*  --   --  5.2  --   --   --   HGB 6.3* 6.6* 6.8* 8.1* 8.0* 7.4* 7.4*  HCT 20.8* 21.4* 21.0* 24.5* 25.5* 23.4* 23.4*  MCV 87.0 87.7  --  85.4 85.6 87.3 87.6  PLT 371 307  --  253 305 248 269   Basic Metabolic Panel: Recent Labs  Lab 04/08/23 1822 04/09/23 0637 04/10/23 0650 04/12/23 0540 04/13/23 0435  NA 129* 126* 128* 126* 125*  K 3.6 3.2* 4.4 4.4 4.2  CL 97* 98 100 93* 92*  CO2 20* 18* 22 26 25   GLUCOSE 100* 123* 97 99 92  BUN 9 8 <5* 13 16  CREATININE 0.83 0.71 0.51* 0.54* 0.58*  CALCIUM 7.6* 7.1* 7.1* 7.5* 7.4*  MG  --  1.4*  --   --   --   PHOS  --  3.3  --  3.9 4.4   GFR: Estimated Creatinine Clearance: 139.9 mL/min (A) (by C-G formula based on SCr of 0.58 mg/dL (L)). Liver Function Tests: Recent Labs  Lab 04/08/23 1822  AST 23  ALT 14  ALKPHOS 98  BILITOT 0.4  PROT 7.2  ALBUMIN <1.5*   Recent Labs  Lab 04/11/23 0606  LIPASE 26   No  results for input(s): "AMMONIA" in the last 168 hours. Coagulation Profile: Recent Labs  Lab 04/08/23 1822 04/10/23 0650  INR 1.3* 1.2   Cardiac Enzymes: Recent Labs  Lab 04/12/23 0540  CKTOTAL 110   BNP (last 3 results) No results for input(s): "PROBNP" in the last 8760 hours. HbA1C: No results for input(s): "HGBA1C" in the last 72 hours. CBG: Recent Labs  Lab 04/09/23 1929 04/09/23 2322 04/10/23 0313 04/10/23 0715 04/10/23 1121  GLUCAP 114* 109* 93 92 122*   Lipid Profile: No results for input(s): "CHOL", "HDL", "LDLCALC", "TRIG", "CHOLHDL", "LDLDIRECT" in the last 72 hours. Thyroid Function Tests: No results for input(s): "TSH", "T4TOTAL", "FREET4", "T3FREE", "THYROIDAB" in the last 72 hours. Anemia Panel: No results for input(s): "VITAMINB12", "FOLATE", "FERRITIN", "TIBC", "IRON", "RETICCTPCT" in the last 72 hours.  Sepsis Labs: Recent Labs  Lab 04/08/23 1828 04/08/23 2008 04/08/23 2219  LATICACIDVEN 2.8* 2.4* 2.1*    Recent Results (from the past 240 hour(s))  Culture, blood (Routine x 2)     Status: None   Collection Time: 04/08/23  6:22 PM   Specimen: BLOOD  Result Value Ref Range Status   Specimen Description BLOOD SITE NOT SPECIFIED  Final   Special Requests   Final    BOTTLES DRAWN AEROBIC ONLY Blood Culture results may not be optimal due to an inadequate volume of blood received in culture bottles   Culture   Final    NO GROWTH 5 DAYS Performed at [facility], [address], [place], [place] [address]    Report Status 04/13/2023 FINAL  Final  Culture, blood (Routine x 2)     Status: Abnormal (Preliminary result)   Collection Time: 04/08/23  8:08 PM   Specimen: BLOOD  Result Value Ref Range Status   Specimen Description BLOOD SITE NOT SPECIFIED  Final   Special Requests   Final    BOTTLES DRAWN AEROBIC AND ANAEROBIC Blood Culture adequate volume   Culture  Setup Time   Final    GRAM POSITIVE COCCI IN CHAINS IN BOTH AEROBIC AND  ANAEROBIC BOTTLES CRITICAL RESULT CALLED TO, READ BACK BY AND VERIFIED WITH: E. SINCLAIR PHARMD, AT 0940 D. VANHOOK GRAM POSITIVE COCCI IN CLUSTERS AEROBIC BOTTLE ONLY CRITICAL RESULT CALLED TO, READ BACK BY AND VERIFIED WITH: PHARMD EMILY S. [id number] FCP    Culture (A)  Final  METHICILLIN RESISTANT STAPHYLOCOCCUS AUREUS STREPTOCOCCUS GROUP C PROTEUS MIRABILIS CRITICAL RESULT CALLED TO, READ BACK BY AND VERIFIED WITH: PHARMD E.SINCLAIR AT 0940 ON 04/11/2023 BY T.SAAD. Sent to [facility] for further susceptibility testing. Performed at [facility], [address] [place], [place] [address]    Report Status PENDING  Incomplete   Organism ID, Bacteria METHICILLIN RESISTANT STAPHYLOCOCCUS AUREUS  Final   Organism ID, Bacteria STREPTOCOCCUS GROUP C  Final   Organism ID, Bacteria PROTEUS MIRABILIS  Final      Susceptibility   Methicillin resistant staphylococcus aureus - MIC*    CIPROFLOXACIN >=8 RESISTANT Resistant     ERYTHROMYCIN >=8 RESISTANT Resistant     GENTAMICIN <=0.5 SENSITIVE Sensitive     OXACILLIN >=4 RESISTANT Resistant     TETRACYCLINE >=16 RESISTANT Resistant     VANCOMYCIN <=0.5 SENSITIVE Sensitive     TRIMETH/SULFA 160 RESISTANT Resistant     CLINDAMYCIN RESISTANT Resistant     RIFAMPIN <=0.5 SENSITIVE Sensitive     Inducible Clindamycin POSITIVE Resistant     LINEZOLID 2 SENSITIVE Sensitive     * METHICILLIN RESISTANT STAPHYLOCOCCUS AUREUS   Proteus mirabilis - MIC*    AMPICILLIN <=2 SENSITIVE Sensitive     CEFEPIME <=0.12 SENSITIVE Sensitive     CEFTAZIDIME <=1 SENSITIVE Sensitive     CEFTRIAXONE <=0.25 SENSITIVE Sensitive     CIPROFLOXACIN <=0.25 SENSITIVE Sensitive     GENTAMICIN <=1 SENSITIVE Sensitive     IMIPENEM 2 SENSITIVE Sensitive     TRIMETH/SULFA <=20 SENSITIVE Sensitive     AMPICILLIN/SULBACTAM <=2 SENSITIVE Sensitive     PIP/TAZO <=4 SENSITIVE Sensitive     * PROTEUS MIRABILIS   Streptococcus group c - MIC*    CLINDAMYCIN RESISTANT  Resistant     AMPICILLIN <=0.25 SENSITIVE Sensitive     ERYTHROMYCIN 2 RESISTANT Resistant     VANCOMYCIN 0.25 SENSITIVE Sensitive     CEFTRIAXONE <=0.12 SENSITIVE Sensitive     LEVOFLOXACIN 0.5 SENSITIVE Sensitive     PENICILLIN <=0.06 SENSITIVE Sensitive     * STREPTOCOCCUS GROUP C  Blood Culture ID Panel (Reflexed)     Status: Abnormal   Collection Time: 04/08/23  8:08 PM  Result Value Ref Range Status   Enterococcus faecalis NOT DETECTED NOT DETECTED Final   Enterococcus Faecium NOT DETECTED NOT DETECTED Final   Listeria monocytogenes NOT DETECTED NOT DETECTED Final   Staphylococcus species NOT DETECTED NOT DETECTED Final   Staphylococcus aureus (BCID) NOT DETECTED NOT DETECTED Final   Staphylococcus epidermidis NOT DETECTED NOT DETECTED Final   Staphylococcus lugdunensis NOT DETECTED NOT DETECTED Final   Streptococcus species DETECTED (A) NOT DETECTED Final    Comment: Not Enterococcus species, Streptococcus agalactiae, Streptococcus pyogenes, or Streptococcus pneumoniae. CRITICAL RESULT CALLED TO, READ BACK BY AND VERIFIED WITH: E. SINCLAIR PHARMD, AT 0940 D. VANHOOK    Streptococcus agalactiae NOT DETECTED NOT DETECTED Final   Streptococcus pneumoniae NOT DETECTED NOT DETECTED Final   Streptococcus pyogenes NOT DETECTED NOT DETECTED Final   A.calcoaceticus-baumannii NOT DETECTED NOT DETECTED Final   Bacteroides fragilis NOT DETECTED NOT DETECTED Final   Enterobacterales NOT DETECTED NOT DETECTED Final   Enterobacter cloacae complex NOT DETECTED NOT DETECTED Final   Escherichia coli NOT DETECTED NOT DETECTED Final   Klebsiella aerogenes NOT DETECTED NOT DETECTED Final   Klebsiella oxytoca NOT DETECTED NOT DETECTED Final   Klebsiella pneumoniae NOT DETECTED NOT DETECTED Final   Proteus species NOT DETECTED NOT DETECTED Final   Salmonella species NOT DETECTED  NOT DETECTED Final   Serratia marcescens NOT DETECTED NOT DETECTED Final   Haemophilus influenzae NOT DETECTED NOT  DETECTED Final   Neisseria meningitidis NOT DETECTED NOT DETECTED Final   Pseudomonas aeruginosa NOT DETECTED NOT DETECTED Final   Stenotrophomonas maltophilia NOT DETECTED NOT DETECTED Final   Candida albicans NOT DETECTED NOT DETECTED Final   Candida auris NOT DETECTED NOT DETECTED Final   Candida glabrata NOT DETECTED NOT DETECTED Final   Candida krusei NOT DETECTED NOT DETECTED Final   Candida parapsilosis NOT DETECTED NOT DETECTED Final   Candida tropicalis NOT DETECTED NOT DETECTED Final   Cryptococcus neoformans/gattii NOT DETECTED NOT DETECTED Final    Comment: Performed at [facility], [address] [place], [place] [address]  Blood Culture ID Panel (Reflexed)     Status: Abnormal   Collection Time: 04/08/23  8:08 PM  Result Value Ref Range Status   Enterococcus faecalis NOT DETECTED NOT DETECTED Final   Enterococcus Faecium NOT DETECTED NOT DETECTED Final   Listeria monocytogenes NOT DETECTED NOT DETECTED Final   Staphylococcus species DETECTED (A) NOT DETECTED Final    Comment: CRITICAL RESULT CALLED TO, READ BACK BY AND VERIFIED WITH: PHARMD EMILY S. [phone] FCP    Staphylococcus aureus (BCID) DETECTED (A) NOT DETECTED Final    Comment: Methicillin (oxacillin)-resistant Staphylococcus aureus (MRSA). MRSA is predictably resistant to beta-lactam antibiotics (except ceftaroline). Preferred therapy is vancomycin unless clinically contraindicated. Patient requires contact precautions if  hospitalized. CRITICAL RESULT CALLED TO, READ BACK BY AND VERIFIED WITH: PHARMD EMILY S. [phone] FCP    Staphylococcus epidermidis NOT DETECTED NOT DETECTED Final   Staphylococcus lugdunensis NOT DETECTED NOT DETECTED Final   Streptococcus species DETECTED (A) NOT DETECTED Final    Comment: Not Enterococcus species, Streptococcus agalactiae, Streptococcus pyogenes, or Streptococcus pneumoniae. CRITICAL RESULT CALLED TO, READ BACK BY AND VERIFIED WITH: PHARMD EMILY S. 1143  [id number] FCP    Streptococcus agalactiae NOT DETECTED NOT DETECTED Final   Streptococcus pneumoniae NOT DETECTED NOT DETECTED Final   Streptococcus pyogenes NOT DETECTED NOT DETECTED Final   A.calcoaceticus-baumannii NOT DETECTED NOT DETECTED Final   Bacteroides fragilis NOT DETECTED NOT DETECTED Final   Enterobacterales NOT DETECTED NOT DETECTED Final   Enterobacter cloacae complex NOT DETECTED NOT DETECTED Final   Escherichia coli NOT DETECTED NOT DETECTED Final   Klebsiella aerogenes NOT DETECTED NOT DETECTED Final   Klebsiella oxytoca NOT DETECTED NOT DETECTED Final   Klebsiella pneumoniae NOT DETECTED NOT DETECTED Final   Proteus species NOT DETECTED NOT DETECTED Final   Salmonella species NOT DETECTED NOT DETECTED Final   Serratia marcescens NOT DETECTED NOT DETECTED Final   Haemophilus influenzae NOT DETECTED NOT DETECTED Final   Neisseria meningitidis NOT DETECTED NOT DETECTED Final   Pseudomonas aeruginosa NOT DETECTED NOT DETECTED Final   Stenotrophomonas maltophilia NOT DETECTED NOT DETECTED Final   Candida albicans NOT DETECTED NOT DETECTED Final   Candida auris NOT DETECTED NOT DETECTED Final   Candida glabrata NOT DETECTED NOT DETECTED Final   Candida krusei NOT DETECTED NOT DETECTED Final   Candida parapsilosis NOT DETECTED NOT DETECTED Final   Candida tropicalis NOT DETECTED NOT DETECTED Final   Cryptococcus neoformans/gattii NOT DETECTED NOT DETECTED Final   Meth resistant mecA/C and MREJ DETECTED (A) NOT DETECTED Final    Comment: CRITICAL RESULT CALLED TO, READ BACK BY AND VERIFIED WITH: PHARMD EMILY S. [phone] FCP Performed at [facility], [address], [place], [place] [address]  Urine Culture     Status: Abnormal   Collection Time: 04/09/23 12:16 AM   Specimen: Urine, Random  Result Value Ref Range Status   Specimen Description URINE, RANDOM  Final   Special Requests   Final    NONE Reflexed from [phone] [id number] Performed at [facility],  [address] [place], [place] [address]    Culture (A)  Final    >=100,000 COLONIES/mL PROTEUS MIRABILIS 30,000 COLONIES/mL ESCHERICHIA COLI    Report Status 04/13/2023 FINAL  Final   Organism ID, Bacteria ESCHERICHIA COLI (A)  Final   Organism ID, Bacteria PROTEUS MIRABILIS (A)  Final      Susceptibility   Escherichia coli - MIC*    AMPICILLIN >=32 RESISTANT Resistant     CEFAZOLIN <=4 SENSITIVE Sensitive     CEFEPIME <=0.12 SENSITIVE Sensitive     CEFTRIAXONE <=0.25 SENSITIVE Sensitive     CIPROFLOXACIN >=4 RESISTANT Resistant     GENTAMICIN <=1 SENSITIVE Sensitive     IMIPENEM <=0.25 SENSITIVE Sensitive     NITROFURANTOIN <=16 SENSITIVE Sensitive     TRIMETH/SULFA >=320 RESISTANT Resistant     AMPICILLIN/SULBACTAM 4 SENSITIVE Sensitive     PIP/TAZO <=4 SENSITIVE Sensitive     * 30,000 COLONIES/mL ESCHERICHIA COLI   Proteus mirabilis - MIC*    AMPICILLIN 16 INTERMEDIATE Intermediate     CEFAZOLIN <=4 SENSITIVE Sensitive     CEFEPIME <=0.12 SENSITIVE Sensitive     CEFTRIAXONE <=0.25 SENSITIVE Sensitive     CIPROFLOXACIN <=0.25 SENSITIVE Sensitive     GENTAMICIN <=1 SENSITIVE Sensitive     IMIPENEM 1 SENSITIVE Sensitive     NITROFURANTOIN 128 RESISTANT Resistant     TRIMETH/SULFA <=20 SENSITIVE Sensitive     AMPICILLIN/SULBACTAM 8 SENSITIVE Sensitive     * >=100,000 COLONIES/mL PROTEUS MIRABILIS  Culture, blood (Routine X 2) w Reflex to ID Panel     Status: None (Preliminary result)   Collection Time: 04/10/23  6:50 AM   Specimen: BLOOD LEFT HAND  Result Value Ref Range Status   Specimen Description BLOOD LEFT HAND  Final   Special Requests   Final    BOTTLES DRAWN AEROBIC AND ANAEROBIC Blood Culture adequate volume   Culture   Final    NO GROWTH 3 DAYS Performed at [facility], [address] [place], [place] [address]    Report Status PENDING  Incomplete  Culture, blood (Routine X 2) w Reflex to ID Panel     Status: None (Preliminary result)   Collection Time:  04/10/23  6:50 AM   Specimen: BLOOD LEFT HAND  Result Value Ref Range Status   Specimen Description BLOOD LEFT HAND  Final   Special Requests   Final    BOTTLES DRAWN AEROBIC AND ANAEROBIC Blood Culture adequate volume   Culture   Final    NO GROWTH 3 DAYS Performed at [facility], [address] [place], [place] [address]    Report Status PENDING  Incomplete         Radiology Studies: No results found.   Scheduled Meds:  ARIPiprazole  15 mg Oral Daily   ascorbic acid  500 mg Oral BID   Chlorhexidine Gluconate Cloth  6 each Topical Daily   cholecalciferol  1,000 Units Oral Daily   feeding supplement  237 mL Oral TID BM   leptospermum manuka honey  1 Application Topical Daily   midodrine  10 mg Oral Q8H   multivitamin with minerals  1  tablet Oral Daily   nutrition supplement (JUVEN)  1 packet Oral BID BM   pantoprazole  40 mg Oral Daily   sodium chloride  2 g Oral TID WC   zinc sulfate  220 mg Oral Daily   Continuous Infusions:  cefTRIAXone (ROCEPHIN)  IV 2 g (04/13/23 1326)   DAPTOmycin (CUBICIN) 600 mg in sodium chloride 0.9 % IVPB 600 mg (04/13/23 1324)     LOS: 4 days   Huey Bienenstock, MD [facility]  If 7PM-7AM, please contact night-coverage [email or web address]  04/13/2023, 2:48 PM
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
## 2023-04-13 NOTE — Progress Notes (Signed)
RE: Steve [name] Date of Birth: [birth year] Date: 04/13/23  Please be advised that the above-named patient will require a short-term nursing home stay - anticipated 30 days or less for rehabilitation and strengthening.  The plan is for return home.

## 2023-04-14 DIAGNOSIS — L89150 Pressure ulcer of sacral region, unstageable: Secondary | ICD-10-CM | POA: Diagnosis not present

## 2023-04-14 DIAGNOSIS — B9562 Methicillin resistant Staphylococcus aureus infection as the cause of diseases classified elsewhere: Secondary | ICD-10-CM | POA: Diagnosis not present

## 2023-04-14 DIAGNOSIS — R6521 Severe sepsis with septic shock: Secondary | ICD-10-CM | POA: Diagnosis not present

## 2023-04-14 DIAGNOSIS — A419 Sepsis, unspecified organism: Secondary | ICD-10-CM | POA: Diagnosis not present

## 2023-04-14 LAB — BPAM RBC
Blood Product Expiration Date: 202408142359
Blood Product Expiration Date: 202408172359
Blood Product Expiration Date: 202408192359
ISSUE DATE / TIME: 202407280557
ISSUE DATE / TIME: 202407281207
Unit Type and Rh: 6200
Unit Type and Rh: 6200
Unit Type and Rh: 6200

## 2023-04-14 LAB — TYPE AND SCREEN
ABO/RH(D): A POS
Antibody Screen: NEGATIVE
Unit division: 0
Unit division: 0
Unit division: 0

## 2023-04-14 LAB — CBC
HCT: 19.4 % — ABNORMAL LOW (ref 39.0–52.0)
Hemoglobin: 6.1 g/dL — CL (ref 13.0–17.0)
MCH: 27.1 pg (ref 26.0–34.0)
MCHC: 31.4 g/dL (ref 30.0–36.0)
MCV: 86.2 fL (ref 80.0–100.0)
Platelets: 195 10*3/uL (ref 150–400)
RBC: 2.25 MIL/uL — ABNORMAL LOW (ref 4.22–5.81)
RDW: 16.7 % — ABNORMAL HIGH (ref 11.5–15.5)
WBC: 4.9 10*3/uL (ref 4.0–10.5)
nRBC: 0 % (ref 0.0–0.2)

## 2023-04-14 LAB — PHOSPHORUS: Phosphorus: 2.5 mg/dL (ref 2.5–4.6)

## 2023-04-14 LAB — BASIC METABOLIC PANEL WITH GFR
Anion gap: 9 (ref 5–15)
BUN: 13 mg/dL (ref 6–20)
CO2: 23 mmol/L (ref 22–32)
Calcium: 7.1 mg/dL — ABNORMAL LOW (ref 8.9–10.3)
Chloride: 93 mmol/L — ABNORMAL LOW (ref 98–111)
Creatinine, Ser: 0.71 mg/dL (ref 0.61–1.24)
GFR, Estimated: >60 mL/min (ref >60)
Glucose, Bld: 130 mg/dL — ABNORMAL HIGH (ref 70–99)
Potassium: 4.1 mmol/L (ref 3.5–5.1)
Sodium: 125 mmol/L — ABNORMAL LOW (ref 135–145)

## 2023-04-14 LAB — PREPARE RBC (CROSSMATCH)

## 2023-04-14 LAB — OCCULT BLOOD X 1 CARD TO LAB, STOOL: Fecal Occult Bld: NEGATIVE

## 2023-04-14 MED ORDER — SODIUM CHLORIDE 0.9% IV SOLUTION: Freq: Once | INTRAVENOUS | Status: AC

## 2023-04-14 MED ORDER — VITAMIN E 45 MG (100 UNIT) PO CAPS
400.0000 [IU] | ORAL_CAPSULE | Freq: Every day | ORAL | Status: DC
  Administered 2023-04-14 – 2023-05-03 (×19): 400 [IU] via ORAL
  Filled 2023-04-14 (×20): qty 4

## 2023-04-14 NOTE — Progress Notes (Signed)
PROGRESS NOTE    Steve [name]  RUE:[id number] DOB: 04-25-1991 DOA: 04/08/2023 PCP: [facility]   Brief Narrative:   32 year old male with past medical history as below which is significant for paraplegia, substance abuse issues, psychostimulant induced psychosis, neurogenic bladder, chronic hypotension on midodrine, chronic osteomyelitis, paranoid schizophrenia, and PTSD. He receives most of his medical care through the [place] where he was recently admitted for infections of bilateral ischial wounds.   Presents to the emergency department on 7/22 with complaints of weakness and malaise. Upon arrival arrival to the emergency department he was noted to have purulent drainage from his bilateral pressure wounds as well as abdominal pain. Imaging revealed concern for left hip septic arthritis/osteomyelitis.  Patient had hypotension refractory to fluids and admitted to the ICU with concerns for septic shock in the setting of septic arthritis/osteomyelitis of the left hip.   Assessment & Plan:   Principal Problem:   Septic shock (HCC) Active Problems:   Amphetamine abuse (HCC)   MRSA bacteremia   Septic arthritis (HCC)   Pressure ulcer   Wound infection   Acute on chronic osteomyelitis septic arthritis of the left hip, septic shock, POA  polymicrobial with MRSA, Group C Streptococcus and Proteus mirabilis bacteremia Proteus mirabilis UTI -Antibiotics management per ID, vancomycin changed to daptomycin, Rocephin has been added to cover for Proteus bacteremia/UTI -Continue with wound care -ID following, vancomycin for osteomyelitis, ceftriaxone can be stopped for presumed UTI, questionable streptococcal bacteremia, TTE unremarkable, repeat cultures sent, no need for TEE at this time. -Orthopedics following, no indication for procedure, and recommendation for prolonged antibiotic course  -Following surveillance blood cultures, so far remains negative, will need total of 6 weeks of  IV antibiotics, will request PICC line insertion. -New with zinc and vitamin C to promote wound healing   Acute on chronic normocytic anemia  -Transfuse as needed. -Globin is low at 6.1 today, will transfuse 2 units, no significant oozing from his wounds, no blood in stools, he is Hemoccult negative -Will check B12 and folic acid   Hypotension -blood pressure remains soft, continue with midodrine, will add albumin given extremely low albumin at less than 1.5  hyponatremia -With significant fluid intake, and low solute intake, continue with fluid restrictions, salt tablets, he was encouraged to drink his supplements   GERD -Resume home PPI  Vitamin D deficiency-continue with supplements  Vitamin A, C, D deficiency, zinc deficiency -Continue with supplements    Hyponatremia Hypokalemia Hypomagnesemia Non-anion gap metabolic acidosis -Continue LR, follow repeat labs, replete electrolytes as necessary   Neurogenic bladder/UTI Paraplegia -Continue Foley, urine culture growing Proteus, continue with Rocephin   Schizophrenia Substance abuse  **Home med rec continues to be incomplete, records have been requested from the [place], will start Abilify at prior reported dose to ensure no lag in medications. -Self reports use of methamphetamines self-reports use of methamphetamines  Pressure ulcers -Continue with wound care, started on vitamin C and zinc Pressure Injury 05/30/21 Ankle Left;Lateral Unstageable - Full thickness tissue loss in which the base of the injury is covered by slough (yellow, tan, gray, green or brown) and/or eschar (tan, brown or black) in the wound bed. (Active)  05/30/21 1938  Location: Ankle  Location Orientation: Left;Lateral  Staging: Unstageable - Full thickness tissue loss in which the base of the injury is covered by slough (yellow, tan, gray, green or brown) and/or eschar (tan, brown or black) in the wound bed.  Wound Description (Comments):   Present on  Admission: Yes     Pressure Injury 08/29/21 Ischial tuberosity Left Stage 4 - Full thickness tissue loss with exposed bone, tendon or muscle. [id number] (Active)  08/29/21 0100  Location: Ischial tuberosity  Location Orientation: Left  Staging: Stage 4 - Full thickness tissue loss with exposed bone, tendon or muscle.  Wound Description (Comments): [id number]  Present on Admission: Yes     Pressure Injury 04/09/23 Pretibial Right;Lateral Stage 2 -  Partial thickness loss of dermis presenting as a shallow open injury with a red, pink wound bed without slough. full thickness, NOT a pressure injury (Active)  04/09/23 0351  Location: Pretibial  Location Orientation: Right;Lateral  Staging: Stage 2 -  Partial thickness loss of dermis presenting as a shallow open injury with a red, pink wound bed without slough.  Wound Description (Comments): full thickness, NOT a pressure injury  Present on Admission: Yes     Pressure Injury 04/09/23 Ischial tuberosity Right Stage 4 - Full thickness tissue loss with exposed bone, tendon or muscle. (Active)  04/09/23 0100  Location: Ischial tuberosity  Location Orientation: Right  Staging: Stage 4 - Full thickness tissue loss with exposed bone, tendon or muscle.  Wound Description (Comments):   Present on Admission: Yes     Pressure Injury 04/09/23 Elbow Left;Posterior Stage 2 -  Partial thickness loss of dermis presenting as a shallow open injury with a red, pink wound bed without slough. (Active)  04/09/23 0330  Location: Elbow  Location Orientation: Left;Posterior  Staging: Stage 2 -  Partial thickness loss of dermis presenting as a shallow open injury with a red, pink wound bed without slough.  Wound Description (Comments):   Present on Admission: Yes      DVT prophylaxis: SCDs Start: 04/09/23 0140   Code Status:   Code Status: Full Code  Family Communication: None at bedside  Status is: Inpatient  Dispo: The patient is from: Home               Anticipated d/c is to: To be determined              Anticipated d/c date is: 72+ hours              Patient currently not medically stable for discharge  Consultants:  PCCM, ID, orthopedic  Procedures:  None    Subjective: Report generalized body achiness  Objective: Vitals:   04/14/23 0557 04/14/23 0620 04/14/23 0625 04/14/23 1200  BP: 96/68 (!) 91/53 109/67   Pulse: 69 71 66 79  Resp: (!) 25 (!) 24 (!) 25   Temp: 97.7 F (36.5 C) 97.6 F (36.4 C)  98 F (36.7 C)  TempSrc: Oral Oral  Oral  SpO2: 96% 96% 96% 97%  Weight:      Height:        Intake/Output Summary (Last 24 hours) at 04/14/2023 1435 Last data filed at 04/14/2023 1238 Gross per 24 hour  Intake 816.49 ml  Output 2800 ml  Net -1983.51 ml   Filed Weights   04/10/23 0704 04/11/23 0400 04/14/23 0500  Weight: 72.1 kg 76 kg 77 kg    Examination:   Awake Alert, Oriented X 3, chronically ill-appearing Symmetrical Chest wall movement, Good air movement bilaterally, CTAB RRR,No Gallops,Rubs or new Murmurs, No Parasternal Heave +ve B.Sounds, Abd Soft, No tenderness, No rebound - guarding or rigidity. Dressing apply to his pressure ulcers   Data Reviewed: I have personally reviewed following labs and imaging studies  CBC: Recent Labs  Lab  04/08/23 2008 04/09/23 2440 04/10/23 0650 04/11/23 0606 04/12/23 0540 04/13/23 0435 04/14/23 0225  WBC 10.6*   < > 6.6 8.3 7.3 7.6 4.9  NEUTROABS 9.3*  --  5.2  --   --   --   --   HGB 6.3*   < > 8.1* 8.0* 7.4* 7.4* 6.1*  HCT 20.8*   < > 24.5* 25.5* 23.4* 23.4* 19.4*  MCV 87.0   < > 85.4 85.6 87.3 87.6 86.2  PLT 371   < > 253 305 248 269 195   < > = values in this interval not displayed.   Basic Metabolic Panel: Recent Labs  Lab 04/09/23 0637 04/10/23 0650 04/12/23 0540 04/13/23 0435 04/14/23 0225  NA 126* 128* 126* 125* 125*  K 3.2* 4.4 4.4 4.2 4.1  CL 98 100 93* 92* 93*  CO2 18* 22 26 25 23   GLUCOSE 123* 97 99 92 130*  BUN 8 <5* 13 16 13    CREATININE 0.71 0.51* 0.54* 0.58* 0.71  CALCIUM 7.1* 7.1* 7.5* 7.4* 7.1*  MG 1.4*  --   --   --   --   PHOS 3.3  --  3.9 4.4 2.5   GFR: Estimated Creatinine Clearance: 139.9 mL/min (by C-G formula based on SCr of 0.71 mg/dL). Liver Function Tests: Recent Labs  Lab 04/08/23 1822  AST 23  ALT 14  ALKPHOS 98  BILITOT 0.4  PROT 7.2  ALBUMIN <1.5*   Recent Labs  Lab 04/11/23 0606  LIPASE 26   No results for input(s): "AMMONIA" in the last 168 hours. Coagulation Profile: Recent Labs  Lab 04/08/23 1822 04/10/23 0650  INR 1.3* 1.2   Cardiac Enzymes: Recent Labs  Lab 04/12/23 0540  CKTOTAL 110   BNP (last 3 results) No results for input(s): "PROBNP" in the last 8760 hours. HbA1C: No results for input(s): "HGBA1C" in the last 72 hours. CBG: Recent Labs  Lab 04/09/23 1929 04/09/23 2322 04/10/23 0313 04/10/23 0715 04/10/23 1121  GLUCAP 114* 109* 93 92 122*   Lipid Profile: No results for input(s): "CHOL", "HDL", "LDLCALC", "TRIG", "CHOLHDL", "LDLDIRECT" in the last 72 hours. Thyroid Function Tests: No results for input(s): "TSH", "T4TOTAL", "FREET4", "T3FREE", "THYROIDAB" in the last 72 hours. Anemia Panel: No results for input(s): "VITAMINB12", "FOLATE", "FERRITIN", "TIBC", "IRON", "RETICCTPCT" in the last 72 hours.  Sepsis Labs: Recent Labs  Lab 04/08/23 1828 04/08/23 2008 04/08/23 2219  LATICACIDVEN 2.8* 2.4* 2.1*    Recent Results (from the past 240 hour(s))  Culture, blood (Routine x 2)     Status: None   Collection Time: 04/08/23  6:22 PM   Specimen: BLOOD  Result Value Ref Range Status   Specimen Description BLOOD SITE NOT SPECIFIED  Final   Special Requests   Final    BOTTLES DRAWN AEROBIC ONLY Blood Culture results may not be optimal due to an inadequate volume of blood received in culture bottles   Culture   Final    NO GROWTH 5 DAYS Performed at [facility], [address] [place], [place] [address]    Report Status 04/13/2023  FINAL  Final  Culture, blood (Routine x 2)     Status: Abnormal (Preliminary result)   Collection Time: 04/08/23  8:08 PM   Specimen: BLOOD  Result Value Ref Range Status   Specimen Description BLOOD SITE NOT SPECIFIED  Final   Special Requests   Final    BOTTLES DRAWN AEROBIC AND ANAEROBIC Blood Culture adequate volume   Culture  Setup Time   Final    GRAM POSITIVE COCCI IN CHAINS IN BOTH AEROBIC AND ANAEROBIC BOTTLES CRITICAL RESULT CALLED TO, READ BACK BY AND VERIFIED WITH: E. SINCLAIR PHARMD, AT 0940 D. VANHOOK GRAM POSITIVE COCCI IN CLUSTERS AEROBIC BOTTLE ONLY CRITICAL RESULT CALLED TO, READ BACK BY AND VERIFIED WITH: PHARMD EMILY S. [id number] FCP    Culture (A)  Final    METHICILLIN RESISTANT STAPHYLOCOCCUS AUREUS STREPTOCOCCUS GROUP C PROTEUS MIRABILIS CRITICAL RESULT CALLED TO, READ BACK BY AND VERIFIED WITH: PHARMD E.SINCLAIR AT 0940 ON 04/11/2023 BY T.SAAD. Sent to [facility] for further susceptibility testing. Performed at [facility], [address], [place], [place] [address]    Report Status PENDING  Incomplete   Organism ID, Bacteria METHICILLIN RESISTANT STAPHYLOCOCCUS AUREUS  Final   Organism ID, Bacteria STREPTOCOCCUS GROUP C  Final   Organism ID, Bacteria PROTEUS MIRABILIS  Final      Susceptibility   Methicillin resistant staphylococcus aureus - MIC*    CIPROFLOXACIN >=8 RESISTANT Resistant     ERYTHROMYCIN >=8 RESISTANT Resistant     GENTAMICIN <=0.5 SENSITIVE Sensitive     OXACILLIN >=4 RESISTANT Resistant     TETRACYCLINE >=16 RESISTANT Resistant     VANCOMYCIN <=0.5 SENSITIVE Sensitive     TRIMETH/SULFA 160 RESISTANT Resistant     CLINDAMYCIN RESISTANT Resistant     RIFAMPIN <=0.5 SENSITIVE Sensitive     Inducible Clindamycin POSITIVE Resistant     LINEZOLID 2 SENSITIVE Sensitive     * METHICILLIN RESISTANT STAPHYLOCOCCUS AUREUS   Proteus mirabilis - MIC*    AMPICILLIN <=2 SENSITIVE Sensitive     CEFEPIME <=0.12 SENSITIVE Sensitive      CEFTAZIDIME <=1 SENSITIVE Sensitive     CEFTRIAXONE <=0.25 SENSITIVE Sensitive     CIPROFLOXACIN <=0.25 SENSITIVE Sensitive     GENTAMICIN <=1 SENSITIVE Sensitive     IMIPENEM 2 SENSITIVE Sensitive     TRIMETH/SULFA <=20 SENSITIVE Sensitive     AMPICILLIN/SULBACTAM <=2 SENSITIVE Sensitive     PIP/TAZO <=4 SENSITIVE Sensitive     * PROTEUS MIRABILIS   Streptococcus group c - MIC*    CLINDAMYCIN RESISTANT Resistant     AMPICILLIN <=0.25 SENSITIVE Sensitive     ERYTHROMYCIN 2 RESISTANT Resistant     VANCOMYCIN 0.25 SENSITIVE Sensitive     CEFTRIAXONE <=0.12 SENSITIVE Sensitive     LEVOFLOXACIN 0.5 SENSITIVE Sensitive     PENICILLIN <=0.06 SENSITIVE Sensitive     * STREPTOCOCCUS GROUP C  Blood Culture ID Panel (Reflexed)     Status: Abnormal   Collection Time: 04/08/23  8:08 PM  Result Value Ref Range Status   Enterococcus faecalis NOT DETECTED NOT DETECTED Final   Enterococcus Faecium NOT DETECTED NOT DETECTED Final   Listeria monocytogenes NOT DETECTED NOT DETECTED Final   Staphylococcus species NOT DETECTED NOT DETECTED Final   Staphylococcus aureus (BCID) NOT DETECTED NOT DETECTED Final   Staphylococcus epidermidis NOT DETECTED NOT DETECTED Final   Staphylococcus lugdunensis NOT DETECTED NOT DETECTED Final   Streptococcus species DETECTED (A) NOT DETECTED Final    Comment: Not Enterococcus species, Streptococcus agalactiae, Streptococcus pyogenes, or Streptococcus pneumoniae. CRITICAL RESULT CALLED TO, READ BACK BY AND VERIFIED WITH: E. SINCLAIR PHARMD, AT 0940 D. VANHOOK    Streptococcus agalactiae NOT DETECTED NOT DETECTED Final   Streptococcus pneumoniae NOT DETECTED NOT DETECTED Final   Streptococcus pyogenes NOT DETECTED NOT DETECTED Final   A.calcoaceticus-baumannii NOT DETECTED NOT DETECTED Final   Bacteroides fragilis NOT DETECTED NOT DETECTED  Final   Enterobacterales NOT DETECTED NOT DETECTED Final   Enterobacter cloacae complex NOT DETECTED NOT DETECTED Final    Escherichia coli NOT DETECTED NOT DETECTED Final   Klebsiella aerogenes NOT DETECTED NOT DETECTED Final   Klebsiella oxytoca NOT DETECTED NOT DETECTED Final   Klebsiella pneumoniae NOT DETECTED NOT DETECTED Final   Proteus species NOT DETECTED NOT DETECTED Final   Salmonella species NOT DETECTED NOT DETECTED Final   Serratia marcescens NOT DETECTED NOT DETECTED Final   Haemophilus influenzae NOT DETECTED NOT DETECTED Final   Neisseria meningitidis NOT DETECTED NOT DETECTED Final   Pseudomonas aeruginosa NOT DETECTED NOT DETECTED Final   Stenotrophomonas maltophilia NOT DETECTED NOT DETECTED Final   Candida albicans NOT DETECTED NOT DETECTED Final   Candida auris NOT DETECTED NOT DETECTED Final   Candida glabrata NOT DETECTED NOT DETECTED Final   Candida krusei NOT DETECTED NOT DETECTED Final   Candida parapsilosis NOT DETECTED NOT DETECTED Final   Candida tropicalis NOT DETECTED NOT DETECTED Final   Cryptococcus neoformans/gattii NOT DETECTED NOT DETECTED Final    Comment: Performed at [facility], [address], [place], [place] [address]  Blood Culture ID Panel (Reflexed)     Status: Abnormal   Collection Time: 04/08/23  8:08 PM  Result Value Ref Range Status   Enterococcus faecalis NOT DETECTED NOT DETECTED Final   Enterococcus Faecium NOT DETECTED NOT DETECTED Final   Listeria monocytogenes NOT DETECTED NOT DETECTED Final   Staphylococcus species DETECTED (A) NOT DETECTED Final    Comment: CRITICAL RESULT CALLED TO, READ BACK BY AND VERIFIED WITH: PHARMD EMILY S. [id number] [id number] FCP    Staphylococcus aureus (BCID) DETECTED (A) NOT DETECTED Final    Comment: Methicillin (oxacillin)-resistant Staphylococcus aureus (MRSA). MRSA is predictably resistant to beta-lactam antibiotics (except ceftaroline). Preferred therapy is vancomycin unless clinically contraindicated. Patient requires contact precautions if  hospitalized. CRITICAL RESULT CALLED TO, READ BACK BY AND VERIFIED  WITH: PHARMD EMILY S. [phone] FCP    Staphylococcus epidermidis NOT DETECTED NOT DETECTED Final   Staphylococcus lugdunensis NOT DETECTED NOT DETECTED Final   Streptococcus species DETECTED (A) NOT DETECTED Final    Comment: Not Enterococcus species, Streptococcus agalactiae, Streptococcus pyogenes, or Streptococcus pneumoniae. CRITICAL RESULT CALLED TO, READ BACK BY AND VERIFIED WITH: PHARMD EMILY S. [phone] FCP    Streptococcus agalactiae NOT DETECTED NOT DETECTED Final   Streptococcus pneumoniae NOT DETECTED NOT DETECTED Final   Streptococcus pyogenes NOT DETECTED NOT DETECTED Final   A.calcoaceticus-baumannii NOT DETECTED NOT DETECTED Final   Bacteroides fragilis NOT DETECTED NOT DETECTED Final   Enterobacterales NOT DETECTED NOT DETECTED Final   Enterobacter cloacae complex NOT DETECTED NOT DETECTED Final   Escherichia coli NOT DETECTED NOT DETECTED Final   Klebsiella aerogenes NOT DETECTED NOT DETECTED Final   Klebsiella oxytoca NOT DETECTED NOT DETECTED Final   Klebsiella pneumoniae NOT DETECTED NOT DETECTED Final   Proteus species NOT DETECTED NOT DETECTED Final   Salmonella species NOT DETECTED NOT DETECTED Final   Serratia marcescens NOT DETECTED NOT DETECTED Final   Haemophilus influenzae NOT DETECTED NOT DETECTED Final   Neisseria meningitidis NOT DETECTED NOT DETECTED Final   Pseudomonas aeruginosa NOT DETECTED NOT DETECTED Final   Stenotrophomonas maltophilia NOT DETECTED NOT DETECTED Final   Candida albicans NOT DETECTED NOT DETECTED Final   Candida auris NOT DETECTED NOT DETECTED Final   Candida glabrata NOT DETECTED NOT DETECTED Final   Candida krusei NOT DETECTED NOT DETECTED Final   Candida  parapsilosis NOT DETECTED NOT DETECTED Final   Candida tropicalis NOT DETECTED NOT DETECTED Final   Cryptococcus neoformans/gattii NOT DETECTED NOT DETECTED Final   Meth resistant mecA/C and MREJ DETECTED (A) NOT DETECTED Final    Comment: CRITICAL RESULT CALLED TO,  READ BACK BY AND VERIFIED WITH: PHARMD EMILY SMarland Kitchen [phone] FCP Performed at [facility], [address] [place], [place] [address]   Urine Culture     Status: Abnormal   Collection Time: 04/09/23 12:16 AM   Specimen: Urine, Random  Result Value Ref Range Status   Specimen Description URINE, RANDOM  Final   Special Requests   Final    NONE Reflexed from [id number] Performed at [facility], [address], [place], [place] [address]    Culture (A)  Final    >=100,000 COLONIES/mL PROTEUS MIRABILIS 30,000 COLONIES/mL ESCHERICHIA COLI    Report Status 04/13/2023 FINAL  Final   Organism ID, Bacteria ESCHERICHIA COLI (A)  Final   Organism ID, Bacteria PROTEUS MIRABILIS (A)  Final      Susceptibility   Escherichia coli - MIC*    AMPICILLIN >=32 RESISTANT Resistant     CEFAZOLIN <=4 SENSITIVE Sensitive     CEFEPIME <=0.12 SENSITIVE Sensitive     CEFTRIAXONE <=0.25 SENSITIVE Sensitive     CIPROFLOXACIN >=4 RESISTANT Resistant     GENTAMICIN <=1 SENSITIVE Sensitive     IMIPENEM <=0.25 SENSITIVE Sensitive     NITROFURANTOIN <=16 SENSITIVE Sensitive     TRIMETH/SULFA >=320 RESISTANT Resistant     AMPICILLIN/SULBACTAM 4 SENSITIVE Sensitive     PIP/TAZO <=4 SENSITIVE Sensitive     * 30,000 COLONIES/mL ESCHERICHIA COLI   Proteus mirabilis - MIC*    AMPICILLIN 16 INTERMEDIATE Intermediate     CEFAZOLIN <=4 SENSITIVE Sensitive     CEFEPIME <=0.12 SENSITIVE Sensitive     CEFTRIAXONE <=0.25 SENSITIVE Sensitive     CIPROFLOXACIN <=0.25 SENSITIVE Sensitive     GENTAMICIN <=1 SENSITIVE Sensitive     IMIPENEM 1 SENSITIVE Sensitive     NITROFURANTOIN 128 RESISTANT Resistant     TRIMETH/SULFA <=20 SENSITIVE Sensitive     AMPICILLIN/SULBACTAM 8 SENSITIVE Sensitive     * >=100,000 COLONIES/mL PROTEUS MIRABILIS  Culture, blood (Routine X 2) w Reflex to ID Panel     Status: None (Preliminary result)   Collection Time: 04/10/23  6:50 AM   Specimen: BLOOD LEFT HAND  Result Value Ref  Range Status   Specimen Description BLOOD LEFT HAND  Final   Special Requests   Final    BOTTLES DRAWN AEROBIC AND ANAEROBIC Blood Culture adequate volume   Culture   Final    NO GROWTH 4 DAYS Performed at [facility], [address], [place], [place] [address]    Report Status PENDING  Incomplete  Culture, blood (Routine X 2) w Reflex to ID Panel     Status: None (Preliminary result)   Collection Time: 04/10/23  6:50 AM   Specimen: BLOOD LEFT HAND  Result Value Ref Range Status   Specimen Description BLOOD LEFT HAND  Final   Special Requests   Final    BOTTLES DRAWN AEROBIC AND ANAEROBIC Blood Culture adequate volume   Culture   Final    NO GROWTH 4 DAYS Performed at [facility], [address] [place], [place] [address]    Report Status PENDING  Incomplete         Radiology Studies: [place] EKG SITE RITE  Result Date: 04/13/2023 If [facility] image not attached, placement could not be confirmed due to current cardiac rhythm.    Scheduled Meds:  ARIPiprazole  15 mg Oral Daily   ascorbic acid  500 mg Oral BID   Chlorhexidine Gluconate Cloth  6 each Topical Daily   cholecalciferol  1,000 Units Oral Daily   feeding supplement  237 mL Oral TID BM   leptospermum manuka honey  1 Application Topical Daily   midodrine  10 mg Oral Q8H   multivitamin with minerals  1 tablet Oral Daily   nutrition supplement (JUVEN)  1 packet Oral BID BM   nystatin   Topical TID   pantoprazole  40 mg Oral Daily   sodium chloride  2 g Oral TID WC   sucralfate  1 g Oral TID WC & HS   zinc sulfate  220 mg Oral Daily   Continuous Infusions:  cefTRIAXone (ROCEPHIN)  IV 2 g (04/14/23 1120)   DAPTOmycin (CUBICIN) 600 mg in sodium chloride 0.9 % IVPB 600 mg (04/13/23 1324)     LOS: 5 days   Huey Bienenstock, MD [facility]  If 7PM-7AM, please contact night-coverage [email or web address]  04/14/2023, 2:35 PM

## 2023-04-14 NOTE — Plan of Care (Signed)
  Problem: Education: Goal: Knowledge of General Education information will improve Description: Including pain rating scale, medication(s)/side effects and non-pharmacologic comfort measures Outcome: Progressing   Problem: Clinical Measurements: Goal: Respiratory complications will improve Outcome: Progressing   Problem: Nutrition: Goal: Adequate nutrition will be maintained Outcome: Progressing   Problem: Elimination: Goal: Will not experience complications related to bowel motility Outcome: Progressing Goal: Will not experience complications related to urinary retention Outcome: Progressing

## 2023-04-14 NOTE — Progress Notes (Signed)
Blood transfusion started without any reaction. Will continue to monitor. ?

## 2023-04-15 ENCOUNTER — Inpatient Hospital Stay (HOSPITAL_COMMUNITY): Payer: No Typology Code available for payment source

## 2023-04-15 DIAGNOSIS — L89150 Pressure ulcer of sacral region, unstageable: Secondary | ICD-10-CM | POA: Diagnosis not present

## 2023-04-15 DIAGNOSIS — R6521 Severe sepsis with septic shock: Secondary | ICD-10-CM | POA: Diagnosis not present

## 2023-04-15 DIAGNOSIS — M0009 Staphylococcal polyarthritis: Secondary | ICD-10-CM | POA: Diagnosis not present

## 2023-04-15 DIAGNOSIS — A419 Sepsis, unspecified organism: Secondary | ICD-10-CM | POA: Diagnosis not present

## 2023-04-15 MED ORDER — LIDOCAINE HCL (PF) 1 % IJ SOLN
10.0000 mL | Freq: Once | INTRAMUSCULAR | Status: DC
  Filled 2023-04-15: qty 10

## 2023-04-15 MED ORDER — LIDOCAINE HCL 1 % IJ SOLN
INTRAMUSCULAR | Status: AC
  Filled 2023-04-15: qty 20

## 2023-04-15 MED ORDER — VITAMIN A 3 MG (10000 UNIT) PO CAPS
10000.0000 [IU] | ORAL_CAPSULE | Freq: Every day | ORAL | Status: AC
  Administered 2023-04-15 – 2023-04-28 (×12): 10000 [IU] via ORAL
  Filled 2023-04-15 (×16): qty 1

## 2023-04-15 MED ORDER — VITAMIN C 500 MG PO TABS
500.0000 mg | ORAL_TABLET | Freq: Every day | ORAL | Status: DC
  Administered 2023-04-15 – 2023-05-03 (×18): 500 mg via ORAL
  Filled 2023-04-15 (×18): qty 1

## 2023-04-15 NOTE — Progress Notes (Signed)
Brief Nutrition Note  Micronutrient assessed to determine if a deficiency was contributing to poor wound healing. As labs result, will add replacements as needed. Additional labs drawn by provide 7/29.  Micronutrient Profile: CRP 11 (7/24) Vitamin A 7.5 (low) Vitamin C 0.3 (low) Vitamin D 23.23 (low) Vitamin E alpha 6.1, gamma 0.9 (WNL Zinc 31 (low) Folate pending drawn by provider 7/29 Vitamin B12 pending drawn by provider 7/29   Intervention: Continue 1000 international units of cholecalciferol daily for total of 30 days of repletion Continue 220mg  of Zinc Sulfate for total of 14 days Add Vitamin A 10,000 international units x 2 weeks Add Vitamin C 500mg  daily x 30 days   Greig Castilla, RD, LDN Clinical Dietitian RD pager # available in AMION  After hours/weekend pager # available in [facility]

## 2023-04-15 NOTE — Plan of Care (Signed)

## 2023-04-15 NOTE — Progress Notes (Signed)
Arrived to room, patient is lying in fetal position.  Explained to patient that PICC is ordered and he will need to lie on back for at least 45 minutes.  Patient states that he cannot do this.  Conrad [place], RN verified with him that he would be unable to lie on back as needed for PICC insertion.  [place], RN updated and secure chat sent to Dr. Randol Kern.  Patient not candidate for bedside PICC.

## 2023-04-15 NOTE — Progress Notes (Signed)
Patient continues to refuse wound assessment/intervention, linen change and bath at this time. Patient states "I'm going to sleep".

## 2023-04-15 NOTE — Progress Notes (Signed)
Unable to assess wounds at this time Patient states " Im cold cover me up". Offered bath and linen change. Patient refused.

## 2023-04-15 NOTE — Progress Notes (Signed)
PROGRESS NOTE    Steve [name]  EXB:[id number] DOB: Oct 12, 1990 DOA: 04/08/2023 PCP: [facility]   Brief Narrative:   32 year old male with past medical history as below which is significant for paraplegia, substance abuse issues, psychostimulant induced psychosis, neurogenic bladder, chronic hypotension on midodrine, chronic osteomyelitis, paranoid schizophrenia, and PTSD. He receives most of his medical care through the [place] where he was recently admitted for infections of bilateral ischial wounds.   Presents to the emergency department on 7/22 with complaints of weakness and malaise. Upon arrival arrival to the emergency department he was noted to have purulent drainage from his bilateral pressure wounds as well as abdominal pain. Imaging revealed concern for left hip septic arthritis/osteomyelitis.  Patient had hypotension refractory to fluids and admitted to the ICU with concerns for septic shock in the setting of septic arthritis/osteomyelitis of the left hip.   Assessment & Plan:   Principal Problem:   Septic shock (HCC) Active Problems:   Amphetamine abuse (HCC)   MRSA bacteremia   Septic arthritis (HCC)   Pressure ulcer   Wound infection   Acute on chronic osteomyelitis septic arthritis of the left hip, septic shock, POA  polymicrobial with MRSA, Group C Streptococcus and Proteus mirabilis bacteremia Proteus mirabilis UTI -Antibiotics management per ID, vancomycin changed to daptomycin, Rocephin has been added to cover for Proteus bacteremia/UTI -Continue with wound care -ID following, vancomycin for osteomyelitis, ceftriaxone can be stopped for presumed UTI, questionable streptococcal bacteremia, TTE unremarkable, repeat cultures sent, no need for TEE at this time.  As patient will need total of 6 weeks of IV antibiotic treatment for osteomyelitis regardless of TEE findings, EOT 05/22/2023 -Orthopedics following, no indication for procedure, and recommendation for  prolonged antibiotic course  -Following surveillance blood cultures, so far remains negative, will need total of 6 weeks of IV antibiotics, will request PICC line insertion.  PICC line team unable to perform line as patient is unable to lay flat, will request IR to place PICC line. -Continue with zinc and vitamin C to promote wound healing   Acute on chronic normocytic anemia  -Transfuse as needed. -Globin is low at 6.1 today, will transfuse 2 units, no significant oozing from his wounds, no blood in stools, he is Hemoccult negative -Will check B12 and folic acid   Hypotension -blood pressure remains soft, continue with midodrine, will add albumin given extremely low albumin at less than 1.5  hyponatremia -With significant fluid intake, and low solute intake, continue with fluid restrictions, salt tablets, he was encouraged to drink his supplements, sodium improved to 127 today.   GERD -Resume home PPI  Vitamin D deficiency-continue with supplements  Vitamin A, C, D deficiency, zinc deficiency -Continue with supplements    Hyponatremia Hypokalemia Hypomagnesemia Non-anion gap metabolic acidosis -Continue LR, follow repeat labs, replete electrolytes as necessary   Neurogenic bladder/UTI Paraplegia -Continue Foley, urine culture growing Proteus, continue with Rocephin   Schizophrenia Substance abuse  **Home med rec continues to be incomplete, records have been requested from the [place], will start Abilify at prior reported dose to ensure no lag in medications. -Self reports use of methamphetamines self-reports use of methamphetamines  Pressure ulcers -Continue with wound care, started on vitamin C and zinc Pressure Injury 05/30/21 Ankle Left;Lateral Unstageable - Full thickness tissue loss in which the base of the injury is covered by slough (yellow, tan, gray, green or brown) and/or eschar (tan, brown or black) in the wound bed. (Active)  05/30/21 1938  Location:  Ankle   Location Orientation: Left;Lateral  Staging: Unstageable - Full thickness tissue loss in which the base of the injury is covered by slough (yellow, tan, gray, green or brown) and/or eschar (tan, brown or black) in the wound bed.  Wound Description (Comments):   Present on Admission: Yes     Pressure Injury 08/29/21 Ischial tuberosity Left Stage 4 - Full thickness tissue loss with exposed bone, tendon or muscle. [id number] (Active)  08/29/21 0100  Location: Ischial tuberosity  Location Orientation: Left  Staging: Stage 4 - Full thickness tissue loss with exposed bone, tendon or muscle.  Wound Description (Comments): [id number]  Present on Admission: Yes     Pressure Injury 04/09/23 Pretibial Right;Lateral Stage 2 -  Partial thickness loss of dermis presenting as a shallow open injury with a red, pink wound bed without slough. full thickness, NOT a pressure injury (Active)  04/09/23 0351  Location: Pretibial  Location Orientation: Right;Lateral  Staging: Stage 2 -  Partial thickness loss of dermis presenting as a shallow open injury with a red, pink wound bed without slough.  Wound Description (Comments): full thickness, NOT a pressure injury  Present on Admission: Yes     Pressure Injury 04/09/23 Ischial tuberosity Right Stage 4 - Full thickness tissue loss with exposed bone, tendon or muscle. (Active)  04/09/23 0100  Location: Ischial tuberosity  Location Orientation: Right  Staging: Stage 4 - Full thickness tissue loss with exposed bone, tendon or muscle.  Wound Description (Comments):   Present on Admission: Yes     Pressure Injury 04/09/23 Elbow Left;Posterior Stage 2 -  Partial thickness loss of dermis presenting as a shallow open injury with a red, pink wound bed without slough. (Active)  04/09/23 0330  Location: Elbow  Location Orientation: Left;Posterior  Staging: Stage 2 -  Partial thickness loss of dermis presenting as a shallow open injury with a red, pink wound bed without slough.   Wound Description (Comments):   Present on Admission: Yes      DVT prophylaxis: SCDs Start: 04/09/23 0140   Code Status:   Code Status: Full Code  Family Communication: None at bedside  Status is: Inpatient  Dispo: The patient is from: Home              Anticipated d/c is to: To be determined              Anticipated d/c date is: 72+ hours              Patient currently not medically stable for discharge  Consultants:  PCCM, ID, orthopedic  Procedures:  None    Subjective: Report generalized body achiness  Objective: Vitals:   04/15/23 0800 04/15/23 1038 04/15/23 1228 04/15/23 1309  BP: (!) 98/58 101/61  100/65  Pulse:      Resp: (!) 25 10 (!) 24 17  Temp:  97.9 F (36.6 C) 97.9 F (36.6 C)   TempSrc:  Oral    SpO2:      Weight:      Height:        Intake/Output Summary (Last 24 hours) at 04/15/2023 1515 Last data filed at 04/15/2023 0700 Gross per 24 hour  Intake --  Output 1700 ml  Net -1700 ml   Filed Weights   04/10/23 0704 04/11/23 0400 04/14/23 0500  Weight: 72.1 kg 76 kg 77 kg    Examination:   Awake Alert, Oriented X 3, pale, chronically ill-appearing Symmetrical Chest wall movement, Good air movement bilaterally, CTAB  RRR,No Gallops,Rubs or new Murmurs, No Parasternal Heave +ve B.Sounds, Abd Soft, No tenderness, No rebound - guarding or rigidity.   Dressing apply to his pressure ulcers   Data Reviewed: I have personally reviewed following labs and imaging studies  CBC: Recent Labs  Lab 04/08/23 2008 04/09/23 1478 04/10/23 0650 04/11/23 0606 04/12/23 0540 04/13/23 0435 04/14/23 0225 04/15/23 0637  WBC 10.6*   < > 6.6 8.3 7.3 7.6 4.9 6.7  NEUTROABS 9.3*  --  5.2  --   --   --   --   --   HGB 6.3*   < > 8.1* 8.0* 7.4* 7.4* 6.1* 8.5*  HCT 20.8*   < > 24.5* 25.5* 23.4* 23.4* 19.4* 26.4*  MCV 87.0   < > 85.4 85.6 87.3 87.6 86.2 86.6  PLT 371   < > 253 305 248 269 195 306   < > = values in this interval not displayed.   Basic  Metabolic Panel: Recent Labs  Lab 04/09/23 0637 04/10/23 0650 04/12/23 0540 04/13/23 0435 04/14/23 0225 04/15/23 0637  NA 126* 128* 126* 125* 125* 127*  K 3.2* 4.4 4.4 4.2 4.1 4.1  CL 98 100 93* 92* 93* 97*  CO2 18* 22 26 25 23 24   GLUCOSE 123* 97 99 92 130* 97  BUN 8 <5* 13 16 13 9   CREATININE 0.71 0.51* 0.54* 0.58* 0.71 0.52*  CALCIUM 7.1* 7.1* 7.5* 7.4* 7.1* 7.6*  MG 1.4*  --   --   --   --   --   PHOS 3.3  --  3.9 4.4 2.5  --    GFR: Estimated Creatinine Clearance: 139.9 mL/min (A) (by C-G formula based on SCr of 0.52 mg/dL (L)). Liver Function Tests: Recent Labs  Lab 04/08/23 1822  AST 23  ALT 14  ALKPHOS 98  BILITOT 0.4  PROT 7.2  ALBUMIN <1.5*   Recent Labs  Lab 04/11/23 0606  LIPASE 26   No results for input(s): "AMMONIA" in the last 168 hours. Coagulation Profile: Recent Labs  Lab 04/08/23 1822 04/10/23 0650  INR 1.3* 1.2   Cardiac Enzymes: Recent Labs  Lab 04/12/23 0540  CKTOTAL 110   BNP (last 3 results) No results for input(s): "PROBNP" in the last 8760 hours. HbA1C: No results for input(s): "HGBA1C" in the last 72 hours. CBG: Recent Labs  Lab 04/09/23 1929 04/09/23 2322 04/10/23 0313 04/10/23 0715 04/10/23 1121  GLUCAP 114* 109* 93 92 122*   Lipid Profile: No results for input(s): "CHOL", "HDL", "LDLCALC", "TRIG", "CHOLHDL", "LDLDIRECT" in the last 72 hours. Thyroid Function Tests: No results for input(s): "TSH", "T4TOTAL", "FREET4", "T3FREE", "THYROIDAB" in the last 72 hours. Anemia Panel: Recent Labs    04/15/23 0637  VITAMINB12 284  FOLATE 8.6    Sepsis Labs: Recent Labs  Lab 04/08/23 1828 04/08/23 2008 04/08/23 2219  LATICACIDVEN 2.8* 2.4* 2.1*    Recent Results (from the past 240 hour(s))  Culture, blood (Routine x 2)     Status: None   Collection Time: 04/08/23  6:22 PM   Specimen: BLOOD  Result Value Ref Range Status   Specimen Description BLOOD SITE NOT SPECIFIED  Final   Special Requests   Final     BOTTLES DRAWN AEROBIC ONLY Blood Culture results may not be optimal due to an inadequate volume of blood received in culture bottles   Culture   Final    NO GROWTH 5 DAYS Performed at [facility], [address] [place],  [place] [address]    Report Status 04/13/2023 FINAL  Final  Culture, blood (Routine x 2)     Status: Abnormal (Preliminary result)   Collection Time: 04/08/23  8:08 PM   Specimen: BLOOD  Result Value Ref Range Status   Specimen Description BLOOD SITE NOT SPECIFIED  Final   Special Requests   Final    BOTTLES DRAWN AEROBIC AND ANAEROBIC Blood Culture adequate volume   Culture  Setup Time   Final    GRAM POSITIVE COCCI IN CHAINS IN BOTH AEROBIC AND ANAEROBIC BOTTLES CRITICAL RESULT CALLED TO, READ BACK BY AND VERIFIED WITH: E. SINCLAIR PHARMD, AT 0940 D. VANHOOK GRAM POSITIVE COCCI IN CLUSTERS AEROBIC BOTTLE ONLY CRITICAL RESULT CALLED TO, READ BACK BY AND VERIFIED WITH: PHARMD EMILY S. [id number] [id number] FCP    Culture (A)  Final    METHICILLIN RESISTANT STAPHYLOCOCCUS AUREUS STREPTOCOCCUS GROUP C PROTEUS MIRABILIS CRITICAL RESULT CALLED TO, READ BACK BY AND VERIFIED WITH: PHARMD E.SINCLAIR AT 0940 ON 04/11/2023 BY T.SAAD. Sent to [facility] for further susceptibility testing. Performed at [facility], [address] [place], [place] [address]    Report Status PENDING  Incomplete   Organism ID, Bacteria METHICILLIN RESISTANT STAPHYLOCOCCUS AUREUS  Final   Organism ID, Bacteria STREPTOCOCCUS GROUP C  Final   Organism ID, Bacteria PROTEUS MIRABILIS  Final      Susceptibility   Methicillin resistant staphylococcus aureus - MIC*    CIPROFLOXACIN >=8 RESISTANT Resistant     ERYTHROMYCIN >=8 RESISTANT Resistant     GENTAMICIN <=0.5 SENSITIVE Sensitive     OXACILLIN >=4 RESISTANT Resistant     TETRACYCLINE >=16 RESISTANT Resistant     VANCOMYCIN <=0.5 SENSITIVE Sensitive     TRIMETH/SULFA 160 RESISTANT Resistant     CLINDAMYCIN RESISTANT Resistant      RIFAMPIN <=0.5 SENSITIVE Sensitive     Inducible Clindamycin POSITIVE Resistant     LINEZOLID 2 SENSITIVE Sensitive     * METHICILLIN RESISTANT STAPHYLOCOCCUS AUREUS   Proteus mirabilis - MIC*    AMPICILLIN <=2 SENSITIVE Sensitive     CEFEPIME <=0.12 SENSITIVE Sensitive     CEFTAZIDIME <=1 SENSITIVE Sensitive     CEFTRIAXONE <=0.25 SENSITIVE Sensitive     CIPROFLOXACIN <=0.25 SENSITIVE Sensitive     GENTAMICIN <=1 SENSITIVE Sensitive     IMIPENEM 2 SENSITIVE Sensitive     TRIMETH/SULFA <=20 SENSITIVE Sensitive     AMPICILLIN/SULBACTAM <=2 SENSITIVE Sensitive     PIP/TAZO <=4 SENSITIVE Sensitive     * PROTEUS MIRABILIS   Streptococcus group c - MIC*    CLINDAMYCIN RESISTANT Resistant     AMPICILLIN <=0.25 SENSITIVE Sensitive     ERYTHROMYCIN 2 RESISTANT Resistant     VANCOMYCIN 0.25 SENSITIVE Sensitive     CEFTRIAXONE <=0.12 SENSITIVE Sensitive     LEVOFLOXACIN 0.5 SENSITIVE Sensitive     PENICILLIN <=0.06 SENSITIVE Sensitive     * STREPTOCOCCUS GROUP C  Blood Culture ID Panel (Reflexed)     Status: Abnormal   Collection Time: 04/08/23  8:08 PM  Result Value Ref Range Status   Enterococcus faecalis NOT DETECTED NOT DETECTED Final   Enterococcus Faecium NOT DETECTED NOT DETECTED Final   Listeria monocytogenes NOT DETECTED NOT DETECTED Final   Staphylococcus species NOT DETECTED NOT DETECTED Final   Staphylococcus aureus (BCID) NOT DETECTED NOT DETECTED Final   Staphylococcus epidermidis NOT DETECTED NOT DETECTED Final   Staphylococcus lugdunensis NOT DETECTED NOT DETECTED Final   Streptococcus species DETECTED (A) NOT DETECTED  Final    Comment: Not Enterococcus species, Streptococcus agalactiae, Streptococcus pyogenes, or Streptococcus pneumoniae. CRITICAL RESULT CALLED TO, READ BACK BY AND VERIFIED WITH: E. SINCLAIR PHARMD, AT 0940 D. VANHOOK    Streptococcus agalactiae NOT DETECTED NOT DETECTED Final   Streptococcus pneumoniae NOT DETECTED NOT DETECTED Final   Streptococcus  pyogenes NOT DETECTED NOT DETECTED Final   A.calcoaceticus-baumannii NOT DETECTED NOT DETECTED Final   Bacteroides fragilis NOT DETECTED NOT DETECTED Final   Enterobacterales NOT DETECTED NOT DETECTED Final   Enterobacter cloacae complex NOT DETECTED NOT DETECTED Final   Escherichia coli NOT DETECTED NOT DETECTED Final   Klebsiella aerogenes NOT DETECTED NOT DETECTED Final   Klebsiella oxytoca NOT DETECTED NOT DETECTED Final   Klebsiella pneumoniae NOT DETECTED NOT DETECTED Final   Proteus species NOT DETECTED NOT DETECTED Final   Salmonella species NOT DETECTED NOT DETECTED Final   Serratia marcescens NOT DETECTED NOT DETECTED Final   Haemophilus influenzae NOT DETECTED NOT DETECTED Final   Neisseria meningitidis NOT DETECTED NOT DETECTED Final   Pseudomonas aeruginosa NOT DETECTED NOT DETECTED Final   Stenotrophomonas maltophilia NOT DETECTED NOT DETECTED Final   Candida albicans NOT DETECTED NOT DETECTED Final   Candida auris NOT DETECTED NOT DETECTED Final   Candida glabrata NOT DETECTED NOT DETECTED Final   Candida krusei NOT DETECTED NOT DETECTED Final   Candida parapsilosis NOT DETECTED NOT DETECTED Final   Candida tropicalis NOT DETECTED NOT DETECTED Final   Cryptococcus neoformans/gattii NOT DETECTED NOT DETECTED Final    Comment: Performed at [facility], [address] [place], [place] [address]  Blood Culture ID Panel (Reflexed)     Status: Abnormal   Collection Time: 04/08/23  8:08 PM  Result Value Ref Range Status   Enterococcus faecalis NOT DETECTED NOT DETECTED Final   Enterococcus Faecium NOT DETECTED NOT DETECTED Final   Listeria monocytogenes NOT DETECTED NOT DETECTED Final   Staphylococcus species DETECTED (A) NOT DETECTED Final    Comment: CRITICAL RESULT CALLED TO, READ BACK BY AND VERIFIED WITH: PHARMD EMILY S. [phone] FCP    Staphylococcus aureus (BCID) DETECTED (A) NOT DETECTED Final    Comment: Methicillin (oxacillin)-resistant Staphylococcus  aureus (MRSA). MRSA is predictably resistant to beta-lactam antibiotics (except ceftaroline). Preferred therapy is vancomycin unless clinically contraindicated. Patient requires contact precautions if  hospitalized. CRITICAL RESULT CALLED TO, READ BACK BY AND VERIFIED WITH: PHARMD EMILY S. [phone] FCP    Staphylococcus epidermidis NOT DETECTED NOT DETECTED Final   Staphylococcus lugdunensis NOT DETECTED NOT DETECTED Final   Streptococcus species DETECTED (A) NOT DETECTED Final    Comment: Not Enterococcus species, Streptococcus agalactiae, Streptococcus pyogenes, or Streptococcus pneumoniae. CRITICAL RESULT CALLED TO, READ BACK BY AND VERIFIED WITH: PHARMD EMILY S. [phone] FCP    Streptococcus agalactiae NOT DETECTED NOT DETECTED Final   Streptococcus pneumoniae NOT DETECTED NOT DETECTED Final   Streptococcus pyogenes NOT DETECTED NOT DETECTED Final   A.calcoaceticus-baumannii NOT DETECTED NOT DETECTED Final   Bacteroides fragilis NOT DETECTED NOT DETECTED Final   Enterobacterales NOT DETECTED NOT DETECTED Final   Enterobacter cloacae complex NOT DETECTED NOT DETECTED Final   Escherichia coli NOT DETECTED NOT DETECTED Final   Klebsiella aerogenes NOT DETECTED NOT DETECTED Final   Klebsiella oxytoca NOT DETECTED NOT DETECTED Final   Klebsiella pneumoniae NOT DETECTED NOT DETECTED Final   Proteus species NOT DETECTED NOT DETECTED Final   Salmonella species NOT DETECTED NOT DETECTED Final   Serratia marcescens NOT DETECTED NOT DETECTED  Final   Haemophilus influenzae NOT DETECTED NOT DETECTED Final   Neisseria meningitidis NOT DETECTED NOT DETECTED Final   Pseudomonas aeruginosa NOT DETECTED NOT DETECTED Final   Stenotrophomonas maltophilia NOT DETECTED NOT DETECTED Final   Candida albicans NOT DETECTED NOT DETECTED Final   Candida auris NOT DETECTED NOT DETECTED Final   Candida glabrata NOT DETECTED NOT DETECTED Final   Candida krusei NOT DETECTED NOT DETECTED Final   Candida  parapsilosis NOT DETECTED NOT DETECTED Final   Candida tropicalis NOT DETECTED NOT DETECTED Final   Cryptococcus neoformans/gattii NOT DETECTED NOT DETECTED Final   Meth resistant mecA/C and MREJ DETECTED (A) NOT DETECTED Final    Comment: CRITICAL RESULT CALLED TO, READ BACK BY AND VERIFIED WITH: PHARMD EMILY S. [phone] FCP Performed at [facility], [address], [place], [place] [address]   Urine Culture     Status: Abnormal   Collection Time: 04/09/23 12:16 AM   Specimen: Urine, Random  Result Value Ref Range Status   Specimen Description URINE, RANDOM  Final   Special Requests   Final    NONE Reflexed from [id number] Performed at [facility], [address], [place], [place] [address]    Culture (A)  Final    >=100,000 COLONIES/mL PROTEUS MIRABILIS 30,000 COLONIES/mL ESCHERICHIA COLI    Report Status 04/13/2023 FINAL  Final   Organism ID, Bacteria ESCHERICHIA COLI (A)  Final   Organism ID, Bacteria PROTEUS MIRABILIS (A)  Final      Susceptibility   Escherichia coli - MIC*    AMPICILLIN >=32 RESISTANT Resistant     CEFAZOLIN <=4 SENSITIVE Sensitive     CEFEPIME <=0.12 SENSITIVE Sensitive     CEFTRIAXONE <=0.25 SENSITIVE Sensitive     CIPROFLOXACIN >=4 RESISTANT Resistant     GENTAMICIN <=1 SENSITIVE Sensitive     IMIPENEM <=0.25 SENSITIVE Sensitive     NITROFURANTOIN <=16 SENSITIVE Sensitive     TRIMETH/SULFA >=320 RESISTANT Resistant     AMPICILLIN/SULBACTAM 4 SENSITIVE Sensitive     PIP/TAZO <=4 SENSITIVE Sensitive     * 30,000 COLONIES/mL ESCHERICHIA COLI   Proteus mirabilis - MIC*    AMPICILLIN 16 INTERMEDIATE Intermediate     CEFAZOLIN <=4 SENSITIVE Sensitive     CEFEPIME <=0.12 SENSITIVE Sensitive     CEFTRIAXONE <=0.25 SENSITIVE Sensitive     CIPROFLOXACIN <=0.25 SENSITIVE Sensitive     GENTAMICIN <=1 SENSITIVE Sensitive     IMIPENEM 1 SENSITIVE Sensitive     NITROFURANTOIN 128 RESISTANT Resistant     TRIMETH/SULFA <=20 SENSITIVE Sensitive      AMPICILLIN/SULBACTAM 8 SENSITIVE Sensitive     * >=100,000 COLONIES/mL PROTEUS MIRABILIS  Culture, blood (Routine X 2) w Reflex to ID Panel     Status: None   Collection Time: 04/10/23  6:50 AM   Specimen: BLOOD LEFT HAND  Result Value Ref Range Status   Specimen Description BLOOD LEFT HAND  Final   Special Requests   Final    BOTTLES DRAWN AEROBIC AND ANAEROBIC Blood Culture adequate volume   Culture   Final    NO GROWTH 5 DAYS Performed at [facility], [address], [place], [place] [address]    Report Status 04/15/2023 FINAL  Final  Culture, blood (Routine X 2) w Reflex to ID Panel     Status: None   Collection Time: 04/10/23  6:50 AM   Specimen: BLOOD LEFT HAND  Result Value Ref Range Status   Specimen Description  BLOOD LEFT HAND  Final   Special Requests   Final    BOTTLES DRAWN AEROBIC AND ANAEROBIC Blood Culture adequate volume   Culture   Final    NO GROWTH 5 DAYS Performed at [facility], [name] [place], [place] [address]    Report Status 04/15/2023 FINAL  Final         Radiology Studies: No results found.   Scheduled Meds:  ARIPiprazole  15 mg Oral Daily   ascorbic acid  500 mg Oral Daily   Chlorhexidine Gluconate Cloth  6 each Topical Daily   cholecalciferol  1,000 Units Oral Daily   feeding supplement  237 mL Oral TID BM   leptospermum manuka honey  1 Application Topical Daily   midodrine  10 mg Oral Q8H   multivitamin with minerals  1 tablet Oral Daily   nutrition supplement (JUVEN)  1 packet Oral BID BM   nystatin   Topical TID   pantoprazole  40 mg Oral Daily   sucralfate  1 g Oral TID WC & HS   vitamin A  10,000 Units Oral Daily   vitamin E  400 Units Oral Daily   zinc sulfate  220 mg Oral Daily   Continuous Infusions:  cefTRIAXone (ROCEPHIN)  IV 2 g (04/15/23 1050)   DAPTOmycin (CUBICIN) 600 mg in sodium chloride 0.9 % IVPB 600 mg (04/15/23 1309)     LOS: 6 days   Huey Bienenstock, MD [facility]  If  7PM-7AM, please contact night-coverage [email or web address]  04/15/2023, 3:15 PM

## 2023-04-15 NOTE — Progress Notes (Signed)
[facility] Infectious Diseases Follow Up Note  Patient Identification: Patient Name: Steve [name] MRN: [id number] Admit Date: 04/08/2023  5:30 PM Age: 32 y.o.Today's Date: 04/15/2023  Reason for Visit: Polymicrobial bacteremia  Principal Problem:   Septic shock (HCC) Active Problems:   Amphetamine abuse (HCC)   MRSA bacteremia   Septic arthritis (HCC)   Pressure ulcer   Wound infection   Current Antibiotics: Vancomycin 7/22- daptomycin starting 7/26-c Ceftriaxone 7/24-c Total days of antibiotics day 8   Lines/Hardwares:   Interval Events: T max 101.1 last night Labs remarkable for NA 127, WBC 6.7   Assessment 32 year old male with prior history of paraplegia with neurogenic bladder, multiple psychiatric issues including paranoid schizophrenia, PTSD, anxiety and depression, substance abuse, chronic hypotension on midodrine, bilateral chronic hip ulcers and chronic osteomyelitis, prior MRSA bacteremia in 06/2022, Providencia rettgeri bacteremia in 12/22  who presented with weakness and malaise, found to have purulent drainage from his bilateral pressure wounds as well as abdominal pain  # Polymicrobial bacteremia with MRSA, group C streptococcus and Proteus mirabilis 2/2 # 3  - 7/24 repeat blood cultures no growth in 4 days - 7/23 TTE with no changes or vegetations - TEE deemed not necessary as prolonged IV antibiotics indicated for osteomyelitis  # Urine cx positive for E coli and Proteus mirabilis - likely  asymptomatic bacteriuria # Septic arthritis of the left hip with osteomyelitis of the left ischium and inferior pubic ramus with associated DU # Healing fracture of the left acetabulum -7/24 no surgical intervention recommended per orthopedics  Recommendations Continue daptomycin and ceftriaxone for 6 weeks.  EOT 05/22/2023  Monitor CBC, CMP and CPK on antibiotics PICC has been ordered already, not OPAT candidate  due to substance use and needs supervised setting for prolonged IV antibiotics ID will so for now, please recall if needed   Rest of the management as per the primary team. Thank you for the consult. Please page with pertinent questions or concerns.  ______________________________________________________________________ Subjective patient seen and examined at the bedside.  He has no particular complaints   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   Anxiety    Anxiety and depression 11/12/2015   Chronic osteomyelitis, pelvis, left (HCC) 05/30/2021   Paranoid schizophrenia (HCC)    Paraplegia (HCC)    PTSD (post-traumatic stress disorder) Paranoid   Past Surgical History:  Procedure Laterality Date   banding procedure for morbid obesity     I & D EXTREMITY Right 02/27/2014   Procedure: IRRIGATION AND DEBRIDEMENT FOREARM AND REPAIR OF 30cm LACERATION;  Surgeon: Eulas Post, MD;  Location: [facility] OR;  Service: Orthopedics;  Laterality: Right;  Anesthesia Regional with MAC   TEE WITHOUT CARDIOVERSION N/A 08/01/2021   Procedure: TRANSESOPHAGEAL ECHOCARDIOGRAM (TEE);  Surgeon: Chrystie Nose, MD;  Location: [facility] ENDOSCOPY;  Service: Cardiovascular;  Laterality: N/A;   TEE WITHOUT CARDIOVERSION N/A 06/28/2022   Procedure: TRANSESOPHAGEAL ECHOCARDIOGRAM (TEE);  Surgeon: Chrystie Nose, MD;  Location: [facility] ENDOSCOPY;  Service: Cardiovascular;  Laterality: N/A;   ' Vitals BP 101/61 (BP Location: Right Arm)   Pulse 80   Temp 97.9 F (36.6 C)   Resp (!) 24   Ht 5\' 11"  (1.803 m)   Wt 77 kg   SpO2 96%   BMI 23.68 kg/m    Physical Exam Constitutional: Adult male lying in the left side appears somewhat sleepy but arousable to questions    Comments: Mucosa moist  Cardiovascular:     Rate and Rhythm: Normal rate and regular rhythm.     Heart sounds: s1 and s2   Pulmonary:     Effort: Pulmonary effort  is normal.     Comments: Normal breath sounds  Abdominal:     Palpations: Abdomen is soft.     Tenderness: Nondistended  Musculoskeletal:        General: No swelling or tenderness in peripheral joints  Skin:    Comments: Multiple decubitus ulcers in different areas of the body bandaged.  Wound care notes reviewed Right posterior knee full thickness wound, red and moist 2X2X.1cm Right anterior knee full thickness wounds .5X.5X.1cm and 5X3X.2cm, 90% red, 10% yellow, mod amt yellow drainage Right lower leg full thickness wound 3X2X.2cm, red and moist, mod amt yellow drainage Left elbow full thickness wound .8X.8X.1cm, red and moist Left posterior arm full thickness wound 4X2X.1cm, yellow and moist Left posterior knee 100% black eschar, 3X3cm Left outer ankle 4X4cm Unstageable pressure injury black eschar/yellow slough  Left anterior knee full thickness wound 7X7X.2cm, 90% red, 10% yellow, mod amt yellow drainage Left posterior calf full thickness wound 50% yed, 50% yellow, mod amt yellow drainage Left anterior foot full thickness wounds 4X3X.2cm and1.5X1.5cm, 50% red, 50% yellow, mod amt yellow drainage.   Neurological:     General: Paraplegia    Pertinent Microbiology Results for orders placed or performed during the hospital encounter of 04/08/23  Culture, blood (Routine x 2)     Status: None   Collection Time: 04/08/23  6:22 PM   Specimen: BLOOD  Result Value Ref Range Status   Specimen Description BLOOD SITE NOT SPECIFIED  Final   Special Requests   Final    BOTTLES DRAWN AEROBIC ONLY Blood Culture results may not be optimal due to an inadequate volume of blood received in culture bottles   Culture   Final    NO GROWTH 5 DAYS Performed at [facility], [address], [place], [place] [address]    Report Status 04/13/2023 FINAL  Final  Culture, blood (Routine x 2)     Status: Abnormal (Preliminary result)   Collection Time: 04/08/23  8:08 PM   Specimen: BLOOD   Result Value Ref Range Status   Specimen Description BLOOD SITE NOT SPECIFIED  Final   Special Requests   Final    BOTTLES DRAWN AEROBIC AND ANAEROBIC Blood Culture adequate volume   Culture  Setup Time   Final    GRAM POSITIVE COCCI IN CHAINS IN BOTH AEROBIC AND ANAEROBIC BOTTLES CRITICAL RESULT CALLED TO, READ BACK BY AND VERIFIED WITH: E. SINCLAIR PHARMD, AT 0940 D. VANHOOK GRAM POSITIVE COCCI IN CLUSTERS AEROBIC BOTTLE ONLY CRITICAL RESULT CALLED TO, READ BACK BY AND VERIFIED WITH: PHARMD EMILY S. [id number] FCP    Culture (A)  Final    METHICILLIN RESISTANT STAPHYLOCOCCUS AUREUS STREPTOCOCCUS GROUP C PROTEUS MIRABILIS CRITICAL RESULT CALLED TO, READ BACK BY AND VERIFIED WITH: PHARMD E.SINCLAIR AT 0940 ON 04/11/2023 BY T.SAAD. Sent to [facility] for further susceptibility testing. Performed at [facility], [address], [place], [place] [address]    Report Status PENDING  Incomplete   Organism ID, Bacteria METHICILLIN RESISTANT STAPHYLOCOCCUS AUREUS  Final   Organism ID, Bacteria STREPTOCOCCUS GROUP C  Final   Organism ID, Bacteria PROTEUS MIRABILIS  Final      Susceptibility   Methicillin resistant staphylococcus aureus - MIC*    CIPROFLOXACIN >=8 RESISTANT Resistant     ERYTHROMYCIN >=  8 RESISTANT Resistant     GENTAMICIN <=0.5 SENSITIVE Sensitive     OXACILLIN >=4 RESISTANT Resistant     TETRACYCLINE >=16 RESISTANT Resistant     VANCOMYCIN <=0.5 SENSITIVE Sensitive     TRIMETH/SULFA 160 RESISTANT Resistant     CLINDAMYCIN RESISTANT Resistant     RIFAMPIN <=0.5 SENSITIVE Sensitive     Inducible Clindamycin POSITIVE Resistant     LINEZOLID 2 SENSITIVE Sensitive     * METHICILLIN RESISTANT STAPHYLOCOCCUS AUREUS   Proteus mirabilis - MIC*    AMPICILLIN <=2 SENSITIVE Sensitive     CEFEPIME <=0.12 SENSITIVE Sensitive     CEFTAZIDIME <=1 SENSITIVE Sensitive     CEFTRIAXONE <=0.25 SENSITIVE Sensitive     CIPROFLOXACIN <=0.25 SENSITIVE Sensitive     GENTAMICIN <=1  SENSITIVE Sensitive     IMIPENEM 2 SENSITIVE Sensitive     TRIMETH/SULFA <=20 SENSITIVE Sensitive     AMPICILLIN/SULBACTAM <=2 SENSITIVE Sensitive     PIP/TAZO <=4 SENSITIVE Sensitive     * PROTEUS MIRABILIS   Streptococcus group c - MIC*    CLINDAMYCIN RESISTANT Resistant     AMPICILLIN <=0.25 SENSITIVE Sensitive     ERYTHROMYCIN 2 RESISTANT Resistant     VANCOMYCIN 0.25 SENSITIVE Sensitive     CEFTRIAXONE <=0.12 SENSITIVE Sensitive     LEVOFLOXACIN 0.5 SENSITIVE Sensitive     PENICILLIN <=0.06 SENSITIVE Sensitive     * STREPTOCOCCUS GROUP C  Blood Culture ID Panel (Reflexed)     Status: Abnormal   Collection Time: 04/08/23  8:08 PM  Result Value Ref Range Status   Enterococcus faecalis NOT DETECTED NOT DETECTED Final   Enterococcus Faecium NOT DETECTED NOT DETECTED Final   Listeria monocytogenes NOT DETECTED NOT DETECTED Final   Staphylococcus species NOT DETECTED NOT DETECTED Final   Staphylococcus aureus (BCID) NOT DETECTED NOT DETECTED Final   Staphylococcus epidermidis NOT DETECTED NOT DETECTED Final   Staphylococcus lugdunensis NOT DETECTED NOT DETECTED Final   Streptococcus species DETECTED (A) NOT DETECTED Final    Comment: Not Enterococcus species, Streptococcus agalactiae, Streptococcus pyogenes, or Streptococcus pneumoniae. CRITICAL RESULT CALLED TO, READ BACK BY AND VERIFIED WITH: E. SINCLAIR PHARMD, AT 0940 D. VANHOOK    Streptococcus agalactiae NOT DETECTED NOT DETECTED Final   Streptococcus pneumoniae NOT DETECTED NOT DETECTED Final   Streptococcus pyogenes NOT DETECTED NOT DETECTED Final   A.calcoaceticus-baumannii NOT DETECTED NOT DETECTED Final   Bacteroides fragilis NOT DETECTED NOT DETECTED Final   Enterobacterales NOT DETECTED NOT DETECTED Final   Enterobacter cloacae complex NOT DETECTED NOT DETECTED Final   Escherichia coli NOT DETECTED NOT DETECTED Final   Klebsiella aerogenes NOT DETECTED NOT DETECTED Final   Klebsiella oxytoca NOT DETECTED NOT  DETECTED Final   Klebsiella pneumoniae NOT DETECTED NOT DETECTED Final   Proteus species NOT DETECTED NOT DETECTED Final   Salmonella species NOT DETECTED NOT DETECTED Final   Serratia marcescens NOT DETECTED NOT DETECTED Final   Haemophilus influenzae NOT DETECTED NOT DETECTED Final   Neisseria meningitidis NOT DETECTED NOT DETECTED Final   Pseudomonas aeruginosa NOT DETECTED NOT DETECTED Final   Stenotrophomonas maltophilia NOT DETECTED NOT DETECTED Final   Candida albicans NOT DETECTED NOT DETECTED Final   Candida auris NOT DETECTED NOT DETECTED Final   Candida glabrata NOT DETECTED NOT DETECTED Final   Candida krusei NOT DETECTED NOT DETECTED Final   Candida parapsilosis NOT DETECTED NOT DETECTED Final   Candida tropicalis NOT DETECTED NOT DETECTED Final   Cryptococcus neoformans/gattii NOT DETECTED NOT DETECTED  Final    Comment: Performed at [facility], [address] [place], [place] [address]  Blood Culture ID Panel (Reflexed)     Status: Abnormal   Collection Time: 04/08/23  8:08 PM  Result Value Ref Range Status   Enterococcus faecalis NOT DETECTED NOT DETECTED Final   Enterococcus Faecium NOT DETECTED NOT DETECTED Final   Listeria monocytogenes NOT DETECTED NOT DETECTED Final   Staphylococcus species DETECTED (A) NOT DETECTED Final    Comment: CRITICAL RESULT CALLED TO, READ BACK BY AND VERIFIED WITH: PHARMD EMILY S. [id number] FCP    Staphylococcus aureus (BCID) DETECTED (A) NOT DETECTED Final    Comment: Methicillin (oxacillin)-resistant Staphylococcus aureus (MRSA). MRSA is predictably resistant to beta-lactam antibiotics (except ceftaroline). Preferred therapy is vancomycin unless clinically contraindicated. Patient requires contact precautions if  hospitalized. CRITICAL RESULT CALLED TO, READ BACK BY AND VERIFIED WITH: PHARMD EMILY S. [phone] FCP    Staphylococcus epidermidis NOT DETECTED NOT DETECTED Final   Staphylococcus lugdunensis NOT DETECTED NOT  DETECTED Final   Streptococcus species DETECTED (A) NOT DETECTED Final    Comment: Not Enterococcus species, Streptococcus agalactiae, Streptococcus pyogenes, or Streptococcus pneumoniae. CRITICAL RESULT CALLED TO, READ BACK BY AND VERIFIED WITH: PHARMD EMILY S. 1143 [id number] FCP    Streptococcus agalactiae NOT DETECTED NOT DETECTED Final   Streptococcus pneumoniae NOT DETECTED NOT DETECTED Final   Streptococcus pyogenes NOT DETECTED NOT DETECTED Final   A.calcoaceticus-baumannii NOT DETECTED NOT DETECTED Final   Bacteroides fragilis NOT DETECTED NOT DETECTED Final   Enterobacterales NOT DETECTED NOT DETECTED Final   Enterobacter cloacae complex NOT DETECTED NOT DETECTED Final   Escherichia coli NOT DETECTED NOT DETECTED Final   Klebsiella aerogenes NOT DETECTED NOT DETECTED Final   Klebsiella oxytoca NOT DETECTED NOT DETECTED Final   Klebsiella pneumoniae NOT DETECTED NOT DETECTED Final   Proteus species NOT DETECTED NOT DETECTED Final   Salmonella species NOT DETECTED NOT DETECTED Final   Serratia marcescens NOT DETECTED NOT DETECTED Final   Haemophilus influenzae NOT DETECTED NOT DETECTED Final   Neisseria meningitidis NOT DETECTED NOT DETECTED Final   Pseudomonas aeruginosa NOT DETECTED NOT DETECTED Final   Stenotrophomonas maltophilia NOT DETECTED NOT DETECTED Final   Candida albicans NOT DETECTED NOT DETECTED Final   Candida auris NOT DETECTED NOT DETECTED Final   Candida glabrata NOT DETECTED NOT DETECTED Final   Candida krusei NOT DETECTED NOT DETECTED Final   Candida parapsilosis NOT DETECTED NOT DETECTED Final   Candida tropicalis NOT DETECTED NOT DETECTED Final   Cryptococcus neoformans/gattii NOT DETECTED NOT DETECTED Final   Meth resistant mecA/C and MREJ DETECTED (A) NOT DETECTED Final    Comment: CRITICAL RESULT CALLED TO, READ BACK BY AND VERIFIED WITH: PHARMD EMILY S. [id number] FCP Performed at [facility], [address] [place], [place] [address]   Urine  Culture     Status: Abnormal   Collection Time: 04/09/23 12:16 AM   Specimen: Urine, Random  Result Value Ref Range Status   Specimen Description URINE, RANDOM  Final   Special Requests   Final    NONE Reflexed from [id number] Performed at [facility], [address] [place], [place] [address]    Culture (A)  Final    >=100,000 COLONIES/mL PROTEUS MIRABILIS 30,000 COLONIES/mL ESCHERICHIA COLI    Report Status 04/13/2023 FINAL  Final   Organism ID, Bacteria ESCHERICHIA COLI (A)  Final   Organism ID, Bacteria PROTEUS MIRABILIS (A)  Final  Susceptibility   Escherichia coli - MIC*    AMPICILLIN >=32 RESISTANT Resistant     CEFAZOLIN <=4 SENSITIVE Sensitive     CEFEPIME <=0.12 SENSITIVE Sensitive     CEFTRIAXONE <=0.25 SENSITIVE Sensitive     CIPROFLOXACIN >=4 RESISTANT Resistant     GENTAMICIN <=1 SENSITIVE Sensitive     IMIPENEM <=0.25 SENSITIVE Sensitive     NITROFURANTOIN <=16 SENSITIVE Sensitive     TRIMETH/SULFA >=320 RESISTANT Resistant     AMPICILLIN/SULBACTAM 4 SENSITIVE Sensitive     PIP/TAZO <=4 SENSITIVE Sensitive     * 30,000 COLONIES/mL ESCHERICHIA COLI   Proteus mirabilis - MIC*    AMPICILLIN 16 INTERMEDIATE Intermediate     CEFAZOLIN <=4 SENSITIVE Sensitive     CEFEPIME <=0.12 SENSITIVE Sensitive     CEFTRIAXONE <=0.25 SENSITIVE Sensitive     CIPROFLOXACIN <=0.25 SENSITIVE Sensitive     GENTAMICIN <=1 SENSITIVE Sensitive     IMIPENEM 1 SENSITIVE Sensitive     NITROFURANTOIN 128 RESISTANT Resistant     TRIMETH/SULFA <=20 SENSITIVE Sensitive     AMPICILLIN/SULBACTAM 8 SENSITIVE Sensitive     * >=100,000 COLONIES/mL PROTEUS MIRABILIS  Culture, blood (Routine X 2) w Reflex to ID Panel     Status: None (Preliminary result)   Collection Time: 04/10/23  6:50 AM   Specimen: BLOOD LEFT HAND  Result Value Ref Range Status   Specimen Description BLOOD LEFT HAND  Final   Special Requests   Final    BOTTLES DRAWN AEROBIC AND ANAEROBIC Blood Culture adequate  volume   Culture   Final    NO GROWTH 4 DAYS Performed at [facility], [address], [place], [place] [address]    Report Status PENDING  Incomplete  Culture, blood (Routine X 2) w Reflex to ID Panel     Status: None (Preliminary result)   Collection Time: 04/10/23  6:50 AM   Specimen: BLOOD LEFT HAND  Result Value Ref Range Status   Specimen Description BLOOD LEFT HAND  Final   Special Requests   Final    BOTTLES DRAWN AEROBIC AND ANAEROBIC Blood Culture adequate volume   Culture   Final    NO GROWTH 4 DAYS Performed at [facility], [address] [place], [place] [address]    Report Status PENDING  Incomplete      Pertinent Lab.    Latest Ref Rng & Units 04/15/2023    6:37 AM 04/14/2023    2:25 AM 04/13/2023    4:35 AM  CBC  WBC 4.0 - 10.5 K/uL 6.7  4.9  7.6   Hemoglobin 13.0 - 17.0 g/dL 8.5  6.1  7.4   Hematocrit 39.0 - 52.0 % 26.4  19.4  23.4   Platelets 150 - 400 K/uL 306  195  269       Latest Ref Rng & Units 04/15/2023    6:37 AM 04/14/2023    2:25 AM 04/13/2023    4:35 AM  CMP  Glucose 70 - 99 mg/dL 97  865  92   BUN 6 - 20 mg/dL 9  13  16    Creatinine 0.61 - 1.24 mg/dL 7.84  6.96  2.95   Sodium 135 - 145 mmol/L 127  125  125   Potassium 3.5 - 5.1 mmol/L 4.1  4.1  4.2   Chloride 98 - 111 mmol/L 97  93  92   CO2 22 - 32 mmol/L 24  23  25    Calcium 8.9 -  10.3 mg/dL 7.6  7.1  7.4      Pertinent Imaging today Plain films and CT images have been personally visualized and interpreted; radiology reports have been reviewed. Decision making incorporated into the Impression  [place] EKG SITE RITE  Result Date: 04/13/2023 If Site Rite image not attached, placement could not be confirmed due to current cardiac rhythm.  ECHOCARDIOGRAM COMPLETE  Result Date: 04/09/2023    ECHOCARDIOGRAM REPORT   Patient Name:   Steve [name] [facility] Date of Exam: 04/09/2023 Medical Rec #:  [id number]       Height:       71.0 in Accession #:    [id number]      Weight:       160.0 lb  Date of Birth:  July 20, 1991       BSA:          1.918 m Patient Age:    32 years        BP:           114/66 mmHg Patient Gender: M               HR:           79 bpm. Exam Location:  Inpatient Procedure: 2D Echo, Cardiac Doppler and Color Doppler Indications:    Shock R57.9  History:        Patient has prior history of Echocardiogram examinations, most                 recent 06/25/2022. Arrythmias:LBBB; Risk Factors:Current Smoker.  Sonographer:    Lucendia Herrlich Referring Phys: [id number] Clarene Critchley HOFFMAN  Sonographer Comments: Image acquisition challenging due to patient body habitus. IMPRESSIONS  1. Left ventricular ejection fraction, by estimation, is 60 to 65%. Left ventricular ejection fraction by 3D volume is 64 %. The left ventricle has normal function. The left ventricle has no regional wall motion abnormalities. Left ventricular diastolic  parameters were normal.  2. Right ventricular systolic function is normal. The right ventricular size is normal. Tricuspid regurgitation signal is inadequate for assessing PA pressure.  3. The mitral valve is normal in structure. No evidence of mitral valve regurgitation. No evidence of mitral stenosis.  4. The aortic valve is normal in structure. Aortic valve regurgitation is not visualized. No aortic stenosis is present.  5. The inferior vena cava is normal in size with greater than 50% respiratory variability, suggesting right atrial pressure of 3 mmHg. Comparison(s): No significant change from prior study. Prior images reviewed side by side. FINDINGS  Left Ventricle: Left ventricular ejection fraction, by estimation, is 60 to 65%. Left ventricular ejection fraction by 3D volume is 64 %. The left ventricle has normal function. The left ventricle has no regional wall motion abnormalities. The left ventricular internal cavity size was normal in size. There is no left ventricular hypertrophy. Left ventricular diastolic parameters were normal. Normal left ventricular filling  pressure. Right Ventricle: The right ventricular size is normal. No increase in right ventricular wall thickness. Right ventricular systolic function is normal. Tricuspid regurgitation signal is inadequate for assessing PA pressure. Left Atrium: Left atrial size was normal in size. Right Atrium: Right atrial size was normal in size. Pericardium: There is no evidence of pericardial effusion. Mitral Valve: The mitral valve is normal in structure. No evidence of mitral valve regurgitation. No evidence of mitral valve stenosis. Tricuspid Valve: The tricuspid valve is normal in structure. Tricuspid valve regurgitation is not demonstrated. No evidence of tricuspid stenosis. Aortic Valve: The  aortic valve is normal in structure. Aortic valve regurgitation is not visualized. No aortic stenosis is present. Aortic valve peak gradient measures 7.2 mmHg. Pulmonic Valve: The pulmonic valve was normal in structure. Pulmonic valve regurgitation is not visualized. No evidence of pulmonic stenosis. Aorta: The aortic root is normal in size and structure. Venous: The inferior vena cava was not well visualized. The inferior vena cava is normal in size with greater than 50% respiratory variability, suggesting right atrial pressure of 3 mmHg. IAS/Shunts: No atrial level shunt detected by color flow Doppler.  LEFT VENTRICLE PLAX 2D LVIDd:         4.50 cm         Diastology LVIDs:         2.90 cm         LV e' medial:    9.95 cm/s LV PW:         1.00 cm         LV E/e' medial:  8.4 LV IVS:        0.90 cm         LV e' lateral:   16.00 cm/s LVOT diam:     2.70 cm         LV E/e' lateral: 5.2 LV SV:         93 LV SV Index:   49 LVOT Area:     5.73 cm        3D Volume EF                                LV 3D EF:    Left                                             ventricul                                             ar                                             ejection                                             fraction                                              by 3D                                             volume is  64 %.                                 3D Volume EF:                                3D EF:        64 %                                LV EDV:       179 ml                                LV ESV:       65 ml                                LV SV:        114 ml RIGHT VENTRICLE RV S prime:     25.30 cm/s TAPSE (M-mode): 2.3 cm LEFT ATRIUM           Index        RIGHT ATRIUM           Index LA diam:      4.20 cm 2.19 cm/m   RA Area:     13.60 cm LA Vol (A2C): 53.6 ml 27.95 ml/m  RA Volume:   32.30 ml  16.84 ml/m LA Vol (A4C): 77.4 ml 40.36 ml/m  AORTIC VALVE                 PULMONIC VALVE AV Area (Vmax): 4.05 cm     PR End Diast Vel: 2.55 msec AV Vmax:        134.00 cm/s AV Peak Grad:   7.2 mmHg LVOT Vmax:      94.70 cm/s LVOT Vmean:     62.900 cm/s LVOT VTI:       0.163 m  AORTA Ao Root diam: 3.50 cm Ao Asc diam:  3.00 cm MITRAL VALVE MV Area (PHT): 3.31 cm    SHUNTS MV Decel Time: 229 msec    Systemic VTI:  0.16 m MV E velocity: 83.40 cm/s  Systemic Diam: 2.70 cm MV A velocity: 41.70 cm/s MV E/A ratio:  2.00 Mihai Croitoru MD Electronically signed by Thurmon Fair MD Signature Date/Time: 04/09/2023/1:26:13 PM    Final    CT ABDOMEN PELVIS W CONTRAST  Result Date: 04/08/2023 CLINICAL DATA:  Acute abdominal pain.  Multiple pelvic wounds. EXAM: CT ABDOMEN AND PELVIS WITH CONTRAST TECHNIQUE: Multidetector CT imaging of the abdomen and pelvis was performed using the standard protocol following bolus administration of intravenous contrast. RADIATION DOSE REDUCTION: This exam was performed according to the departmental dose-optimization program which includes automated exposure control, adjustment of the mA and/or kV according to patient size and/or use of iterative reconstruction technique. CONTRAST:  75mL OMNIPAQUE IOHEXOL 350 MG/ML SOLN COMPARISON:  CT abdomen and pelvis 05/18/2022  FINDINGS: Lower chest: There is atelectasis or scarring in the lingula. There is a 3 mm nodule in the left lower lobe. Hepatobiliary: No focal liver abnormality is seen. No gallstones, gallbladder wall thickening, or biliary dilatation. Pancreas: Unremarkable. No pancreatic ductal dilatation or surrounding inflammatory changes. Spleen: Mildly  enlarged. Adrenals/Urinary Tract: There is mild bladder wall thickening. There is a small amount of air in the bladder. Kidneys and adrenal glands are within normal limits. Stomach/Bowel: There is a small hiatal hernia. Stomach is within normal limits. No evidence of bowel wall thickening, distention, or inflammatory changes. The appendix is not seen. 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 Aorta and IVC are normal in size. There is an enlarged left para-aortic lymph node measuring 11 mm image 3/50 which is new from prior. There also enlarged left iliac chain lymph nodes measuring up to 10 mm short axis, slightly increased from prior. Reproductive: Prostate is unremarkable. Other: There is no ascites or focal abdominal wall hernia. Musculoskeletal: There is dislocation of the left hip posteriorly. 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 There is also ill-defined fluid surrounding the left femoral neck with multiple foci of air. There is also fluid and air seen within the left hip joint space. There is a decubitus ulcer extending to the level of the left ischium, posterior left inferior pubic ramus and neck of the femur. There is underlying erosion of the ischium and inferior pubic ramus. There is a healing fracture of the anterior and medial left acetabulum which appears new. IMPRESSION: 1. 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 Decubitus ulcer overlies this region. 2. There is a new healing fracture of the left  acetabulum. 3. Posterior left hip dislocation appears chronic. 4. New left para-aortic and left iliac chain lymphadenopathy, likely reactive. 5. Mild splenomegaly. 6. Small amount of air in the bladder with bladder wall thickening. Correlate clinically for cystitis. 7. 3 mm left solid pulmonary nodule detected on incomplete chest CT. No follow-up needed if patient is low-risk.This recommendation follows the consensus statement: Guidelines for Management of Incidental Pulmonary Nodules Detected on CT Images: From the Fleischner Society 2017; Radiology 2017; 284:228-243. Electronically Signed   By: Darliss Cheney M.D.   On: 04/08/2023 22:21     I have personally spent 56 minutes involved in face-to-face and non-face-to-face activities for this patient on the day of the visit. Professional time spent includes the following activities: Preparing to see the patient (review of tests), Obtaining and/or reviewing separately obtained history (admission/discharge record), Performing a medically appropriate examination and/or evaluation , Ordering medications/tests/procedures, referring and communicating with other health care professionals, Documenting clinical information in the EMR, Independently interpreting results (not separately reported), Communicating results to the patient/family/caregiver, Counseling and educating the patient/family/caregiver and Care coordination (not separately reported).   Plan d/w requesting provider as well as ID pharm D  Note: This document was prepared using dragon voice recognition software and may include unintentional dictation errors.   Electronically signed by:   Odette Fraction, MD Infectious Disease Physician [facility] for Infectious Disease Pager: [phone]

## 2023-04-15 NOTE — Plan of Care (Signed)
  Problem: Education: Goal: Knowledge of General Education information will improve Description: Including pain rating scale, medication(s)/side effects and non-pharmacologic comfort measures 04/15/2023 0354 by Bonnita Levan, RN Outcome: Progressing 04/15/2023 0354 by Bonnita Levan, RN Outcome: Progressing   Problem: Health Behavior/Discharge Planning: Goal: Ability to manage health-related needs will improve 04/15/2023 0354 by Bonnita Levan, RN Outcome: Progressing 04/15/2023 0354 by Bonnita Levan, RN Outcome: Progressing   Problem: Clinical Measurements: Goal: Ability to maintain clinical measurements within normal limits will improve 04/15/2023 0354 by Bonnita Levan, RN Outcome: Progressing 04/15/2023 0354 by Bonnita Levan, RN Outcome: Progressing Goal: Will remain free from infection 04/15/2023 0354 by Bonnita Levan, RN Outcome: Progressing 04/15/2023 0354 by Bonnita Levan, RN Outcome: Progressing Goal: Diagnostic test results will improve 04/15/2023 0354 by Bonnita Levan, RN Outcome: Progressing 04/15/2023 0354 by Bonnita Levan, RN Outcome: Progressing Goal: Respiratory complications will improve 04/15/2023 0354 by Bonnita Levan, RN Outcome: Progressing 04/15/2023 0354 by Bonnita Levan, RN Outcome: Progressing Goal: Cardiovascular complication will be avoided 04/15/2023 0354 by Bonnita Levan, RN Outcome: Progressing 04/15/2023 0354 by Bonnita Levan, RN Outcome: Progressing   Problem: Activity: Goal: Risk for activity intolerance will decrease 04/15/2023 0354 by Bonnita Levan, RN Outcome: Progressing 04/15/2023 0354 by Bonnita Levan, RN Outcome: Progressing   Problem: Nutrition: Goal: Adequate nutrition will be maintained Outcome: Progressing   Problem: Coping: Goal: Level of anxiety will decrease Outcome: Progressing   Problem: Elimination: Goal: Will not experience complications related to bowel motility Outcome: Progressing Goal: Will not experience complications  related to urinary retention Outcome: Progressing   Problem: Pain Managment: Goal: General experience of comfort will improve Outcome: Progressing   Problem: Safety: Goal: Ability to remain free from injury will improve Outcome: Progressing   Problem: Skin Integrity: Goal: Risk for impaired skin integrity will decrease Outcome: Progressing

## 2023-04-15 NOTE — Procedures (Signed)
PROCEDURE SUMMARY:  Successful placement of double lumen PICC line to right brachial vein. Length 36 cm Tip at lower SVC/RA PICC capped No complications Ready for use  EBL < 5 mL   Patricie Geeslin H Patsye Sullivant PA-C 04/15/2023, 4:30 PM

## 2023-04-15 NOTE — TOC Progression Note (Signed)
Transition of Care [facility]) - Progression Note    Patient Details  Name: Steve [name] MRN: [id number] Date of Birth: 02/07/1991  [facility]) CM/SW Contact  Mearl Latin, LCSW Phone Number: 04/15/2023, 4:50 PM  Clinical Narrative:    CSW reached out to [facility] with the [place] and she stated that [facility] has no[facility]showed to [facility] attempts at [facility] with [facility] (Dr. Bufford Lope, Storm[facility] Fabian). She stated CSW can still fax over the referral to see if they would approve a SNF anyway. CSW faxing forms over.      Barriers to Discharge: Continued Medical Work up, English as a second language teacher, SNF Pending bed offer (Substance use history w/ need for PICC)  Expected Discharge Plan and Services In-house Referral: Clinical Social Work   Post Acute Care Choice: Skilled Nursing Facility Living arrangements for the past 2 months: Single Family Home                                       Social Determinants of Health (SDOH) Interventions SDOH Screenings   Food Insecurity: No Food Insecurity (06/25/2022)  Housing: Low Risk  (06/25/2022)  Transportation Needs: No Transportation Needs (06/25/2022)  Utilities: Not At Risk (06/25/2022)  Tobacco Use: High Risk (04/08/2023)    Readmission Risk Interventions    07/01/2022   10:10 AM 06/29/2022    9:19 AM  Readmission Risk Prevention Plan  Transportation Screening Complete Complete  Medication Review Oceanographer) Complete Complete  PCP or Specialist appointment within 3-5 days of discharge Complete Complete  HRI or Home Care Consult Complete Complete  SW Recovery Care/Counseling Consult Complete Complete  Palliative Care Screening Not Applicable Not Applicable  Skilled Nursing Facility Not Applicable Not Applicable

## 2023-04-16 DIAGNOSIS — R6521 Severe sepsis with septic shock: Secondary | ICD-10-CM | POA: Diagnosis not present

## 2023-04-16 DIAGNOSIS — A419 Sepsis, unspecified organism: Secondary | ICD-10-CM | POA: Diagnosis not present

## 2023-04-16 MED ORDER — CYANOCOBALAMIN 1000 MCG/ML IJ SOLN
1000.0000 ug | Freq: Every day | INTRAMUSCULAR | Status: AC
  Administered 2023-04-16 – 2023-04-22 (×5): 1000 ug via SUBCUTANEOUS
  Filled 2023-04-16 (×5): qty 1

## 2023-04-16 MED ORDER — ENOXAPARIN SODIUM 40 MG/0.4ML IJ SOSY
40.0000 mg | PREFILLED_SYRINGE | INTRAMUSCULAR | Status: DC
  Administered 2023-04-17 – 2023-05-02 (×16): 40 mg via SUBCUTANEOUS
  Filled 2023-04-16 (×17): qty 0.4

## 2023-04-16 MED ORDER — CYANOCOBALAMIN 1000 MCG/ML IJ SOLN
1000.0000 ug | INTRAMUSCULAR | Status: DC
  Administered 2023-04-23: 1000 ug via SUBCUTANEOUS
  Filled 2023-04-16: qty 1

## 2023-04-16 NOTE — TOC Progression Note (Signed)
Transition of Care [facility]) - Progression Note    Patient Details  Name: Steve [name] MRN: [id number] Date of Birth: 1990-12-10  [facility]) CM/SW Contact  Mearl Latin, LCSW Phone Number: 04/16/2023, 4:02 PM  Clinical Narrative:    [facility], CSW can also fax referral to [facility] (Fax: [phone]) for an in house [place] hospital rehab request. CSW faxed request.       Barriers to Discharge: Continued Medical Work up, English as a second language teacher, SNF Pending bed offer (Substance use history w/ need for PICC)  Expected Discharge Plan and Services In-house Referral: Clinical Social Work   Post Acute Care Choice: Skilled Nursing Facility Living arrangements for the past 2 months: Single Family Home                                       Social Determinants of Health (SDOH) Interventions SDOH Screenings   Food Insecurity: No Food Insecurity (06/25/2022)  Housing: Low Risk  (06/25/2022)  Transportation Needs: No Transportation Needs (06/25/2022)  Utilities: Not At Risk (06/25/2022)  Tobacco Use: High Risk (04/08/2023)    Readmission Risk Interventions    07/01/2022   10:10 AM 06/29/2022    9:19 AM  Readmission Risk Prevention Plan  Transportation Screening Complete Complete  Medication Review Oceanographer) Complete Complete  PCP or Specialist appointment within 3-5 days of discharge Complete Complete  HRI or Home Care Consult Complete Complete  SW Recovery Care/Counseling Consult Complete Complete  Palliative Care Screening Not Applicable Not Applicable  Skilled Nursing Facility Not Applicable Not Applicable

## 2023-04-16 NOTE — Progress Notes (Signed)
PROGRESS NOTE    Steve [name]  UJW:[id number] DOB: 1990-11-16 DOA: 04/08/2023 PCP: [facility]   Brief Narrative:   32 year old male with past medical history as below which is significant for paraplegia, substance abuse issues, psychostimulant induced psychosis, neurogenic bladder, chronic hypotension on midodrine, chronic osteomyelitis, paranoid schizophrenia, and PTSD. He receives most of his medical care through the [place] where he was recently admitted for infections of bilateral ischial wounds.   Presents to the emergency department on 7/22 with complaints of weakness and malaise. Upon arrival arrival to the emergency department he was noted to have purulent drainage from his bilateral pressure wounds as well as abdominal pain. Imaging revealed concern for left hip septic arthritis/osteomyelitis.  Patient had hypotension refractory to fluids and admitted to the ICU with concerns for septic shock in the setting of septic arthritis/osteomyelitis of the left hip.  -His workup significant for polymicrobial bacteremia, with recommendation for 6 weeks of IV daptomycin and Rocephin, PICC line inserted 7/29, patient will need facility placement as known history of drug abuse in the past and can go home with PICC line.  Assessment & Plan:   Principal Problem:   Septic shock (HCC) Active Problems:   Amphetamine abuse (HCC)   MRSA bacteremia   Septic arthritis (HCC)   Pressure ulcer   Wound infection   Acute on chronic osteomyelitis septic arthritis of the left hip, septic shock, POA  polymicrobial with MRSA, Group C Streptococcus and Proteus mirabilis bacteremia Proteus mirabilis UTI -Antibiotics management per ID, vancomycin changed to daptomycin, Rocephin has been added to cover for Proteus bacteremia/UTI -Continue with wound care -ID following, vancomycin for osteomyelitis, ceftriaxone can be stopped for presumed UTI, questionable streptococcal bacteremia, TTE unremarkable,  repeat cultures sent, no need for TEE at this time.  As patient will need total of 6 weeks of IV antibiotic treatment for osteomyelitis regardless of TEE findings, EOT 05/22/2023 -Orthopedics following, no indication for procedure, and recommendation for prolonged antibiotic course  -Following surveillance blood cultures, so far remains negative, will need total of 6 weeks of IV antibiotics, PICC line placed by IR 7/29. -Continue with zinc and vitamin C to promote wound healing   Acute on chronic normocytic anemia  -Transfuse as needed.  Received units PRBC 7/29. -no significant oozing from his wounds, no blood in stools, he is Hemoccult negative -Low normal B12 level, will start on supplement  Hypotension -blood pressure remains soft, continue with midodrine, will add albumin given extremely low albumin at less than 1.5  hyponatremia -With significant fluid intake, and low solute intake, continue with fluid restrictions, salt tablets, he was encouraged to drink his supplements, sodium improved to 127 today.   GERD -Resume home PPI  Vitamin D deficiency-continue with supplements  Vitamin A, C, D deficiency, zinc deficiency -Continue with supplements    Hyponatremia Hypokalemia Hypomagnesemia Non-anion gap metabolic acidosis -Continue LR, follow repeat labs, replete electrolytes as necessary   Neurogenic bladder/UTI Paraplegia -Continue Foley, urine culture growing Proteus, continue with Rocephin   Schizophrenia Substance abuse  **Home med rec continues to be incomplete, records have been requested from the [place], will start Abilify at prior reported dose to ensure no lag in medications. -Self reports use of methamphetamines self-reports use of methamphetamines  Pressure ulcers -Continue with wound care, started on vitamin C and zinc Pressure Injury 05/30/21 Ankle Left;Lateral Unstageable - Full thickness tissue loss in which the base of the injury is covered by slough (yellow,  tan, gray, green or  brown) and/or eschar (tan, brown or black) in the wound bed. (Active)  05/30/21 1938  Location: Ankle  Location Orientation: Left;Lateral  Staging: Unstageable - Full thickness tissue loss in which the base of the injury is covered by slough (yellow, tan, gray, green or brown) and/or eschar (tan, brown or black) in the wound bed.  Wound Description (Comments):   Present on Admission: Yes     Pressure Injury 08/29/21 Ischial tuberosity Left Stage 4 - Full thickness tissue loss with exposed bone, tendon or muscle. [id number] (Active)  08/29/21 0100  Location: Ischial tuberosity  Location Orientation: Left  Staging: Stage 4 - Full thickness tissue loss with exposed bone, tendon or muscle.  Wound Description (Comments): [id number]  Present on Admission: Yes     Pressure Injury 04/09/23 Pretibial Right;Lateral Stage 2 -  Partial thickness loss of dermis presenting as a shallow open injury with a red, pink wound bed without slough. full thickness, NOT a pressure injury (Active)  04/09/23 0351  Location: Pretibial  Location Orientation: Right;Lateral  Staging: Stage 2 -  Partial thickness loss of dermis presenting as a shallow open injury with a red, pink wound bed without slough.  Wound Description (Comments): full thickness, NOT a pressure injury  Present on Admission: Yes     Pressure Injury 04/09/23 Ischial tuberosity Right Stage 4 - Full thickness tissue loss with exposed bone, tendon or muscle. (Active)  04/09/23 0100  Location: Ischial tuberosity  Location Orientation: Right  Staging: Stage 4 - Full thickness tissue loss with exposed bone, tendon or muscle.  Wound Description (Comments):   Present on Admission: Yes     Pressure Injury 04/09/23 Elbow Left;Posterior Stage 2 -  Partial thickness loss of dermis presenting as a shallow open injury with a red, pink wound bed without slough. (Active)  04/09/23 0330  Location: Elbow  Location Orientation: Left;Posterior   Staging: Stage 2 -  Partial thickness loss of dermis presenting as a shallow open injury with a red, pink wound bed without slough.  Wound Description (Comments):   Present on Admission: Yes      DVT prophylaxis: enoxaparin (LOVENOX) injection 40 mg Start: 04/16/23 2200 SCDs Start: 04/09/23 0140   Code Status:   Code Status: Full Code  Family Communication: None at bedside  Status is: Inpatient  Dispo: The patient is from: Home              Anticipated d/c is to: To be determined              Anticipated d/c date is: 72+ hours              Patient currently not medically stable for discharge  Consultants:  PCCM, ID, orthopedic  Procedures:  None    Subjective: No significant events overnight as discussed with staff, he denies any complaints today  Objective: Vitals:   04/15/23 1309 04/15/23 2101 04/16/23 0439 04/16/23 1200  BP: 100/65 (!) 129/94 106/67 96/67  Pulse:    89  Resp: 17 17 19  (!) 23  Temp:  98.2 F (36.8 C) 98 F (36.7 C) 97.6 F (36.4 C)  TempSrc:  Oral Oral Oral  SpO2:    97%  Weight:      Height:        Intake/Output Summary (Last 24 hours) at 04/16/2023 1506 Last data filed at 04/16/2023 0834 Gross per 24 hour  Intake 980 ml  Output 4600 ml  Net -3620 ml   Filed Weights   04/10/23  6283 04/11/23 0400 04/14/23 0500  Weight: 72.1 kg 76 kg 77 kg    Examination:   Awake Alert, Oriented X 3, chronically ill-appearing Symmetrical Chest wall movement, Good air movement bilaterally, CTAB RRR,No Gallops,Rubs or new Murmurs, No Parasternal Heave +ve B.Sounds, Abd Soft, No tenderness, No rebound - guarding or rigidity. Wounds and buttocks and hip area and knee area bandaged    Data Reviewed: I have personally reviewed following labs and imaging studies  CBC: Recent Labs  Lab 04/10/23 0650 04/11/23 0606 04/12/23 0540 04/13/23 0435 04/14/23 0225 04/15/23 0637  WBC 6.6 8.3 7.3 7.6 4.9 6.7  NEUTROABS 5.2  --   --   --   --   --    HGB 8.1* 8.0* 7.4* 7.4* 6.1* 8.5*  HCT 24.5* 25.5* 23.4* 23.4* 19.4* 26.4*  MCV 85.4 85.6 87.3 87.6 86.2 86.6  PLT 253 305 248 269 195 306   Basic Metabolic Panel: Recent Labs  Lab 04/12/23 0540 04/13/23 0435 04/14/23 0225 04/15/23 0637 04/16/23 0345  NA 126* 125* 125* 127* 127*  K 4.4 4.2 4.1 4.1 3.6  CL 93* 92* 93* 97* 96*  CO2 26 25 23 24 22   GLUCOSE 99 92 130* 97 95  BUN 13 16 13 9 11   CREATININE 0.54* 0.58* 0.71 0.52* 0.63  CALCIUM 7.5* 7.4* 7.1* 7.6* 7.5*  PHOS 3.9 4.4 2.5  --   --    GFR: Estimated Creatinine Clearance: 139.9 mL/min (by C-G formula based on SCr of 0.63 mg/dL). Liver Function Tests: No results for input(s): "AST", "ALT", "ALKPHOS", "BILITOT", "PROT", "ALBUMIN" in the last 168 hours.  Recent Labs  Lab 04/11/23 0606  LIPASE 26   No results for input(s): "AMMONIA" in the last 168 hours. Coagulation Profile: Recent Labs  Lab 04/10/23 0650  INR 1.2   Cardiac Enzymes: Recent Labs  Lab 04/12/23 0540  CKTOTAL 110   BNP (last 3 results) No results for input(s): "PROBNP" in the last 8760 hours. HbA1C: No results for input(s): "HGBA1C" in the last 72 hours. CBG: Recent Labs  Lab 04/09/23 1929 04/09/23 2322 04/10/23 0313 04/10/23 0715 04/10/23 1121  GLUCAP 114* 109* 93 92 122*   Lipid Profile: No results for input(s): "CHOL", "HDL", "LDLCALC", "TRIG", "CHOLHDL", "LDLDIRECT" in the last 72 hours. Thyroid Function Tests: No results for input(s): "TSH", "T4TOTAL", "FREET4", "T3FREE", "THYROIDAB" in the last 72 hours. Anemia Panel: Recent Labs    04/15/23 0637  VITAMINB12 284  FOLATE 8.6    Sepsis Labs: No results for input(s): "PROCALCITON", "LATICACIDVEN" in the last 168 hours.   Recent Results (from the past 240 hour(s))  Culture, blood (Routine x 2)     Status: None   Collection Time: 04/08/23  6:22 PM   Specimen: BLOOD  Result Value Ref Range Status   Specimen Description BLOOD SITE NOT SPECIFIED  Final   Special Requests    Final    BOTTLES DRAWN AEROBIC ONLY Blood Culture results may not be optimal due to an inadequate volume of blood received in culture bottles   Culture   Final    NO GROWTH 5 DAYS Performed at [facility], [address] [place], [place] [address]    Report Status 04/13/2023 FINAL  Final  Culture, blood (Routine x 2)     Status: Abnormal (Preliminary result)   Collection Time: 04/08/23  8:08 PM   Specimen: BLOOD  Result Value Ref Range Status   Specimen Description BLOOD SITE NOT SPECIFIED  Final  Special Requests   Final    BOTTLES DRAWN AEROBIC AND ANAEROBIC Blood Culture adequate volume   Culture  Setup Time   Final    GRAM POSITIVE COCCI IN CHAINS IN BOTH AEROBIC AND ANAEROBIC BOTTLES CRITICAL RESULT CALLED TO, READ BACK BY AND VERIFIED WITH: E. SINCLAIR PHARMD, AT 0940 D. VANHOOK GRAM POSITIVE COCCI IN CLUSTERS AEROBIC BOTTLE ONLY CRITICAL RESULT CALLED TO, READ BACK BY AND VERIFIED WITH: PHARMD EMILY S. [phone] [id number] FCP    Culture (A)  Final    METHICILLIN RESISTANT STAPHYLOCOCCUS AUREUS STREPTOCOCCUS GROUP C PROTEUS MIRABILIS CRITICAL RESULT CALLED TO, READ BACK BY AND VERIFIED WITH: PHARMD E.SINCLAIR AT 0940 ON 04/11/2023 BY T.SAAD. Sent to [facility] for further susceptibility testing. Performed at [facility], [address] [place], [place] [address]    Report Status PENDING  Incomplete   Organism ID, Bacteria METHICILLIN RESISTANT STAPHYLOCOCCUS AUREUS  Final   Organism ID, Bacteria STREPTOCOCCUS GROUP C  Final   Organism ID, Bacteria PROTEUS MIRABILIS  Final      Susceptibility   Methicillin resistant staphylococcus aureus - MIC*    CIPROFLOXACIN >=8 RESISTANT Resistant     ERYTHROMYCIN >=8 RESISTANT Resistant     GENTAMICIN <=0.5 SENSITIVE Sensitive     OXACILLIN >=4 RESISTANT Resistant     TETRACYCLINE >=16 RESISTANT Resistant     VANCOMYCIN <=0.5 SENSITIVE Sensitive     TRIMETH/SULFA 160 RESISTANT Resistant     CLINDAMYCIN RESISTANT Resistant      RIFAMPIN <=0.5 SENSITIVE Sensitive     Inducible Clindamycin POSITIVE Resistant     LINEZOLID 2 SENSITIVE Sensitive     * METHICILLIN RESISTANT STAPHYLOCOCCUS AUREUS   Proteus mirabilis - MIC*    AMPICILLIN <=2 SENSITIVE Sensitive     CEFEPIME <=0.12 SENSITIVE Sensitive     CEFTAZIDIME <=1 SENSITIVE Sensitive     CEFTRIAXONE <=0.25 SENSITIVE Sensitive     CIPROFLOXACIN <=0.25 SENSITIVE Sensitive     GENTAMICIN <=1 SENSITIVE Sensitive     IMIPENEM 2 SENSITIVE Sensitive     TRIMETH/SULFA <=20 SENSITIVE Sensitive     AMPICILLIN/SULBACTAM <=2 SENSITIVE Sensitive     PIP/TAZO <=4 SENSITIVE Sensitive     * PROTEUS MIRABILIS   Streptococcus group c - MIC*    CLINDAMYCIN RESISTANT Resistant     AMPICILLIN <=0.25 SENSITIVE Sensitive     ERYTHROMYCIN 2 RESISTANT Resistant     VANCOMYCIN 0.25 SENSITIVE Sensitive     CEFTRIAXONE <=0.12 SENSITIVE Sensitive     LEVOFLOXACIN 0.5 SENSITIVE Sensitive     PENICILLIN <=0.06 SENSITIVE Sensitive     * STREPTOCOCCUS GROUP C  Blood Culture ID Panel (Reflexed)     Status: Abnormal   Collection Time: 04/08/23  8:08 PM  Result Value Ref Range Status   Enterococcus faecalis NOT DETECTED NOT DETECTED Final   Enterococcus Faecium NOT DETECTED NOT DETECTED Final   Listeria monocytogenes NOT DETECTED NOT DETECTED Final   Staphylococcus species NOT DETECTED NOT DETECTED Final   Staphylococcus aureus (BCID) NOT DETECTED NOT DETECTED Final   Staphylococcus epidermidis NOT DETECTED NOT DETECTED Final   Staphylococcus lugdunensis NOT DETECTED NOT DETECTED Final   Streptococcus species DETECTED (A) NOT DETECTED Final    Comment: Not Enterococcus species, Streptococcus agalactiae, Streptococcus pyogenes, or Streptococcus pneumoniae. CRITICAL RESULT CALLED TO, READ BACK BY AND VERIFIED WITH: E. SINCLAIR PHARMD, AT 0940 D. VANHOOK    Streptococcus agalactiae NOT DETECTED NOT DETECTED Final   Streptococcus pneumoniae NOT DETECTED NOT DETECTED Final    Streptococcus  pyogenes NOT DETECTED NOT DETECTED Final   A.calcoaceticus-baumannii NOT DETECTED NOT DETECTED Final   Bacteroides fragilis NOT DETECTED NOT DETECTED Final   Enterobacterales NOT DETECTED NOT DETECTED Final   Enterobacter cloacae complex NOT DETECTED NOT DETECTED Final   Escherichia coli NOT DETECTED NOT DETECTED Final   Klebsiella aerogenes NOT DETECTED NOT DETECTED Final   Klebsiella oxytoca NOT DETECTED NOT DETECTED Final   Klebsiella pneumoniae NOT DETECTED NOT DETECTED Final   Proteus species NOT DETECTED NOT DETECTED Final   Salmonella species NOT DETECTED NOT DETECTED Final   Serratia marcescens NOT DETECTED NOT DETECTED Final   Haemophilus influenzae NOT DETECTED NOT DETECTED Final   Neisseria meningitidis NOT DETECTED NOT DETECTED Final   Pseudomonas aeruginosa NOT DETECTED NOT DETECTED Final   Stenotrophomonas maltophilia NOT DETECTED NOT DETECTED Final   Candida albicans NOT DETECTED NOT DETECTED Final   Candida auris NOT DETECTED NOT DETECTED Final   Candida glabrata NOT DETECTED NOT DETECTED Final   Candida krusei NOT DETECTED NOT DETECTED Final   Candida parapsilosis NOT DETECTED NOT DETECTED Final   Candida tropicalis NOT DETECTED NOT DETECTED Final   Cryptococcus neoformans/gattii NOT DETECTED NOT DETECTED Final    Comment: Performed at [facility], [address] [place], [place] [address]  Blood Culture ID Panel (Reflexed)     Status: Abnormal   Collection Time: 04/08/23  8:08 PM  Result Value Ref Range Status   Enterococcus faecalis NOT DETECTED NOT DETECTED Final   Enterococcus Faecium NOT DETECTED NOT DETECTED Final   Listeria monocytogenes NOT DETECTED NOT DETECTED Final   Staphylococcus species DETECTED (A) NOT DETECTED Final    Comment: CRITICAL RESULT CALLED TO, READ BACK BY AND VERIFIED WITH: PHARMD EMILY S. [phone] [id number] FCP    Staphylococcus aureus (BCID) DETECTED (A) NOT DETECTED Final    Comment: Methicillin (oxacillin)-resistant  Staphylococcus aureus (MRSA). MRSA is predictably resistant to beta-lactam antibiotics (except ceftaroline). Preferred therapy is vancomycin unless clinically contraindicated. Patient requires contact precautions if  hospitalized. CRITICAL RESULT CALLED TO, READ BACK BY AND VERIFIED WITH: PHARMD EMILY S. [phone] FCP    Staphylococcus epidermidis NOT DETECTED NOT DETECTED Final   Staphylococcus lugdunensis NOT DETECTED NOT DETECTED Final   Streptococcus species DETECTED (A) NOT DETECTED Final    Comment: Not Enterococcus species, Streptococcus agalactiae, Streptococcus pyogenes, or Streptococcus pneumoniae. CRITICAL RESULT CALLED TO, READ BACK BY AND VERIFIED WITH: PHARMD EMILY S. [phone] [id number] FCP    Streptococcus agalactiae NOT DETECTED NOT DETECTED Final   Streptococcus pneumoniae NOT DETECTED NOT DETECTED Final   Streptococcus pyogenes NOT DETECTED NOT DETECTED Final   A.calcoaceticus-baumannii NOT DETECTED NOT DETECTED Final   Bacteroides fragilis NOT DETECTED NOT DETECTED Final   Enterobacterales NOT DETECTED NOT DETECTED Final   Enterobacter cloacae complex NOT DETECTED NOT DETECTED Final   Escherichia coli NOT DETECTED NOT DETECTED Final   Klebsiella aerogenes NOT DETECTED NOT DETECTED Final   Klebsiella oxytoca NOT DETECTED NOT DETECTED Final   Klebsiella pneumoniae NOT DETECTED NOT DETECTED Final   Proteus species NOT DETECTED NOT DETECTED Final   Salmonella species NOT DETECTED NOT DETECTED Final   Serratia marcescens NOT DETECTED NOT DETECTED Final   Haemophilus influenzae NOT DETECTED NOT DETECTED Final   Neisseria meningitidis NOT DETECTED NOT DETECTED Final   Pseudomonas aeruginosa NOT DETECTED NOT DETECTED Final   Stenotrophomonas maltophilia NOT DETECTED NOT DETECTED Final   Candida albicans NOT DETECTED NOT DETECTED Final   Candida auris NOT DETECTED NOT DETECTED Final  Candida glabrata NOT DETECTED NOT DETECTED Final   Candida krusei NOT DETECTED NOT DETECTED  Final   Candida parapsilosis NOT DETECTED NOT DETECTED Final   Candida tropicalis NOT DETECTED NOT DETECTED Final   Cryptococcus neoformans/gattii NOT DETECTED NOT DETECTED Final   Meth resistant mecA/C and MREJ DETECTED (A) NOT DETECTED Final    Comment: CRITICAL RESULT CALLED TO, READ BACK BY AND VERIFIED WITH: PHARMD EMILY SMarland Kitchen [phone] FCP Performed at [facility], [address], [place], [place] [address]   Urine Culture     Status: Abnormal   Collection Time: 04/09/23 12:16 AM   Specimen: Urine, Random  Result Value Ref Range Status   Specimen Description URINE, RANDOM  Final   Special Requests   Final    NONE Reflexed from [id number] Performed at [facility], [address], [place], [place] [address]    Culture (A)  Final    >=100,000 COLONIES/mL PROTEUS MIRABILIS 30,000 COLONIES/mL ESCHERICHIA COLI    Report Status 04/13/2023 FINAL  Final   Organism ID, Bacteria ESCHERICHIA COLI (A)  Final   Organism ID, Bacteria PROTEUS MIRABILIS (A)  Final      Susceptibility   Escherichia coli - MIC*    AMPICILLIN >=32 RESISTANT Resistant     CEFAZOLIN <=4 SENSITIVE Sensitive     CEFEPIME <=0.12 SENSITIVE Sensitive     CEFTRIAXONE <=0.25 SENSITIVE Sensitive     CIPROFLOXACIN >=4 RESISTANT Resistant     GENTAMICIN <=1 SENSITIVE Sensitive     IMIPENEM <=0.25 SENSITIVE Sensitive     NITROFURANTOIN <=16 SENSITIVE Sensitive     TRIMETH/SULFA >=320 RESISTANT Resistant     AMPICILLIN/SULBACTAM 4 SENSITIVE Sensitive     PIP/TAZO <=4 SENSITIVE Sensitive     * 30,000 COLONIES/mL ESCHERICHIA COLI   Proteus mirabilis - MIC*    AMPICILLIN 16 INTERMEDIATE Intermediate     CEFAZOLIN <=4 SENSITIVE Sensitive     CEFEPIME <=0.12 SENSITIVE Sensitive     CEFTRIAXONE <=0.25 SENSITIVE Sensitive     CIPROFLOXACIN <=0.25 SENSITIVE Sensitive     GENTAMICIN <=1 SENSITIVE Sensitive     IMIPENEM 1 SENSITIVE Sensitive     NITROFURANTOIN 128 RESISTANT Resistant     TRIMETH/SULFA <=20  SENSITIVE Sensitive     AMPICILLIN/SULBACTAM 8 SENSITIVE Sensitive     * >=100,000 COLONIES/mL PROTEUS MIRABILIS  Culture, blood (Routine X 2) w Reflex to ID Panel     Status: None   Collection Time: 04/10/23  6:50 AM   Specimen: BLOOD LEFT HAND  Result Value Ref Range Status   Specimen Description BLOOD LEFT HAND  Final   Special Requests   Final    BOTTLES DRAWN AEROBIC AND ANAEROBIC Blood Culture adequate volume   Culture   Final    NO GROWTH 5 DAYS Performed at [facility], [address], [place], [place] [address]    Report Status 04/15/2023 FINAL  Final  Culture, blood (Routine X 2) w Reflex to ID Panel     Status: None   Collection Time: 04/10/23  6:50 AM   Specimen: BLOOD LEFT HAND  Result Value Ref Range Status   Specimen Description BLOOD LEFT HAND  Final   Special Requests   Final    BOTTLES DRAWN AEROBIC AND ANAEROBIC Blood Culture adequate volume   Culture   Final    NO GROWTH 5 DAYS Performed at [facility], [address], [place], [place] [address]    Report Status 04/15/2023 FINAL  Final         Radiology Studies: IR PICC PLACEMENT RIGHT >5 YRS INC IMG GUIDE  Result Date: 04/15/2023 INDICATION: 32 year old male with bacteremia who is in need of long-term IV antibiotic treatment. Request for PICC line placement. EXAM: ULTRASOUND AND FLUOROSCOPIC GUIDED PICC LINE INSERTION MEDICATIONS: 1% lidocaine CONTRAST:  None FLUOROSCOPY TIME:  18 seconds (4 mGy) COMPLICATIONS: None immediate. TECHNIQUE: The procedure, risks, benefits, and alternatives were explained to the patient and informed written consent was obtained. The right upper extremity was prepped with chlorhexidine in a sterile fashion, and a sterile drape was applied covering the operative field. Maximum barrier sterile technique with sterile gowns and gloves were used for the procedure. A timeout was performed prior to the initiation of the procedure. Local anesthesia was provided with 1%  lidocaine. After the overlying soft tissues were anesthetized with 1% lidocaine, a micropuncture kit was utilized to access the right brachial vein. Real-time ultrasound guidance was utilized for vascular access including the acquisition of a permanent ultrasound image documenting patency of the accessed vessel. A guidewire was advanced to the level of the superior caval-atrial junction for measurement purposes and the PICC line was cut to length. A peel-away sheath was placed and a 36 cm, 5 [place], dual lumen was inserted to level of the superior caval-atrial junction. A post procedure spot fluoroscopic was obtained. The catheter easily aspirated and flushed and was secured in place. A dressing was placed. The patient tolerated the procedure well without immediate post procedural complication. FINDINGS: After catheter placement, the tip lies within the superior cavoatrial junction. The catheter aspirates and flushes normally and is ready for immediate use. IMPRESSION: Successful ultrasound and fluoroscopic guided placement of a right brachial vein approach, 36 cm, 5 French, dual lumen PICC with tip at the superior caval-atrial junction. The PICC line is ready for immediate use. Performed by: Lawernce Ion, PA-C Electronically Signed   By: Corlis Leak M.D.   On: 04/15/2023 16:41     Scheduled Meds:  ARIPiprazole  15 mg Oral Daily   ascorbic acid  500 mg Oral Daily   Chlorhexidine Gluconate Cloth  6 each Topical Daily   cholecalciferol  1,000 Units Oral Daily   enoxaparin (LOVENOX) injection  40 mg Subcutaneous Q24H   feeding supplement  237 mL Oral TID BM   leptospermum manuka honey  1 Application Topical Daily   lidocaine (PF)  10 mL Infiltration Once   midodrine  10 mg Oral Q8H   multivitamin with minerals  1 tablet Oral Daily   nutrition supplement (JUVEN)  1 packet Oral BID BM   nystatin   Topical TID   pantoprazole  40 mg Oral Daily   sucralfate  1 g Oral TID WC & HS   vitamin A  10,000 Units Oral  Daily   vitamin E  400 Units Oral Daily   zinc sulfate  220 mg Oral Daily   Continuous Infusions:  cefTRIAXone (ROCEPHIN)  IV 2 g (04/16/23 0851)   DAPTOmycin (CUBICIN) 600 mg in sodium chloride 0.9 % IVPB 600 mg (04/15/23 1309)     LOS: 7 days   Huey Bienenstock, MD [facility]  If 7PM-7AM, please contact night-coverage [email or web address]  04/16/2023, 3:06 PM

## 2023-04-17 DIAGNOSIS — M0009 Staphylococcal polyarthritis: Secondary | ICD-10-CM | POA: Diagnosis not present

## 2023-04-17 DIAGNOSIS — A419 Sepsis, unspecified organism: Secondary | ICD-10-CM | POA: Diagnosis not present

## 2023-04-17 DIAGNOSIS — F1510 Other stimulant abuse, uncomplicated: Secondary | ICD-10-CM | POA: Diagnosis not present

## 2023-04-17 DIAGNOSIS — R6521 Severe sepsis with septic shock: Secondary | ICD-10-CM | POA: Diagnosis not present

## 2023-04-17 MED ORDER — LACTATED RINGERS IV BOLUS
500.0000 mL | Freq: Once | INTRAVENOUS | Status: AC
  Administered 2023-04-17: 500 mL via INTRAVENOUS

## 2023-04-17 NOTE — Progress Notes (Signed)
Nutrition Follow-up  DOCUMENTATION CODES:   Not applicable  INTERVENTION:  Dc double protein  Continue Juven BID  Continue Ensure Plus High Protein po TID, each supplement provides 350 kcal and 20 grams of protein.  Continue vitamins C, D3, B12, A, E and zinc sulfate  Encourage po intake   Continue snacks      NUTRITION DIAGNOSIS:   Inadequate oral intake related to decreased appetite as evidenced by per patient/family report. -ongoing   GOAL:   Patient will meet greater than or equal to 90% of their needs - progressing   MONITOR:   PO intake, Supplement acceptance, I & O's, Labs  REASON FOR ASSESSMENT:   Rounds (significant wounds)    ASSESSMENT:   Pt with hx of paraplegia, chronic pelvic osteomyelitis, paranoid schizophrenia, drug abuse, and PTSD presented to ED with several days of abdominal pain and purulent drainage from wounds.Found to be in septic shock  Visited patient at bedside who reports he is eating, but can never finish it all and he wishes not to waste food. Patient noted to be getting double portions, so RD will dc those orders for now. He reports he likes the Ensure. RD provided him one at bedside. He reports he is drinking the Reader whenever he gets it.  Labs: CBG 122, Na 127, Cr 0.57 Meds: Vitamin C, rocephin, vitamin D3, Vitamin B12, Ensure TID, medihoney, MVI, Juven BID, protonix, Vitamin A, Vitamin E, zinc sulfate   Wt: admit- 158#, current- 169#  PO: 86% avg po x last 8 documented meals  I/O's:  -11.2 L   Diet Order:   Diet Order             Diet regular Room service appropriate? Yes with Assist; Fluid consistency: Thin; Fluid restriction: 1500 mL Fluid  Diet effective now                   EDUCATION NEEDS:   Education needs have been addressed  Skin:     Last BM:  7/28 type 6  Height:   Ht Readings from Last 1 Encounters:  04/09/23 5\' 11"  (1.803 m)    Weight:   Wt Readings from Last 1 Encounters:  04/17/23  76.8 kg    Ideal Body Weight:  70.4 kg (adjusted by 10% for paralysis)  BMI:  Body mass index is 23.61 kg/m.  Estimated Nutritional Needs:   Kcal:  2200-2400 kcal/d  Protein:  115-130 g/d  Fluid:  >/=2.2L/d   Leodis Rains, RDN, LDN  Clinical Nutrition

## 2023-04-17 NOTE — Plan of Care (Signed)

## 2023-04-17 NOTE — Evaluation (Signed)
Occupational Therapy Evaluation Patient Details Name: Steve [name] MRN: [id number] DOB: Jan 12, 1991 Today's Date: 04/17/2023   History of Present Illness Pt is 32 yo male presenting to [facility] ER on 7/22 with reprots of weakness and malaise. Pt was found to have polymicrobial bacteremia. PMH: paraplegia, psychostimulant induced psychosis, neurogenic bladder, chronic hypotension on midodrine, chronic osteomyelitis, paranoid schizophrenia and PTSD. Pt recently admitted for infections to [facility] of bil ischial wounds.   Clinical Impression   At baseline, pt completes UB ADLs Independent to Min assist, LB ADLs with Min to Mod assist from bed level, and functional mobility with a manual wheelchair with Mod I to Mod assist depending on setting. Pt now presents with decreased activity tolerance, mildly decreased B UE strength, and decreased safety and independence with ADLs, bed mobility during/in preparation for functional tasks, and functional mobility. Pt currently demonstrates ability to complete UB ADLs with Mod I to Min assist, LB ADLs with Max assist, and bed mobility during/in preparation for functional tasks with Min guard to Min assist. Pt will benefit from acute skilled OT services to address deficits outlined below, decrease caregiver burden, and increase safety and independence with ADLs, bed mobility during/in preparation for functional task, functional transfers, and functional mobility with a manual wheelchair. Post acute discharge, pt will benefit from intensive inpatient skilled rehab services < 3 hours per day to maximize rehab potential.      Recommendations for follow up therapy are one component of a multi-disciplinary discharge planning process, led by the attending physician.  Recommendations may be updated based on patient status, additional functional criteria and insurance authorization.   Assistance Recommended at Discharge Intermittent Supervision/Assistance  Patient can return home with  the following Two people to help with walking and/or transfers;A lot of help with bathing/dressing/bathroom;Assistance with cooking/housework;Direct supervision/assist for medications management;Assist for transportation;Help with stairs or ramp for entrance    Functional Status Assessment  Patient has had a recent decline in their functional status and demonstrates the ability to make significant improvements in function in a reasonable and predictable amount of time.  Equipment Recommendations  Other (comment) (Defer to next level of care)    Recommendations for Other Services       Precautions / Restrictions Precautions Precautions: Fall Restrictions Weight Bearing Restrictions: No      Mobility Bed Mobility Overal bed mobility: Needs Assistance Bed Mobility: Supine to Sit, Sit to Supine     Supine to sit: Min guard Sit to supine: Min assist   General bed mobility comments: Pt is able to lift bil LE to perform supine to sitting but requires Min A to lift bil LE for sitting to supine. CGA to scoot EOB.    Transfers                   General transfer comment: Pt deferred transfer this session.      Balance Overall balance assessment: Needs assistance Sitting-balance support: Bilateral upper extremity supported Sitting balance-Leahy Scale: Fair Sitting balance - Comments: pt has significant forward bend at the trunk. No overt LOB                                   ADL either performed or assessed with clinical judgement   ADL Overall ADL's : Needs assistance/impaired Eating/Feeding: Modified independent;Bed level   Grooming: Modified independent;Bed level   Upper Body Bathing: Minimal assistance;Bed level   Lower Body Bathing:  Maximal assistance;Bed level   Upper Body Dressing : Min guard;Bed level   Lower Body Dressing: Maximal assistance;Bed level                 General ADL Comments: Pt presents with decreased activity tolerance  during functional tasks.     Vision Baseline Vision/History: 0 No visual deficits Ability to See in Adequate Light: 0 Adequate Patient Visual Report: No change from baseline       Perception     Praxis      Pertinent Vitals/Pain Pain Assessment Pain Assessment: Faces Faces Pain Scale: Hurts even more Pain Descriptors / Indicators: Grimacing Pain Intervention(s): Limited activity within patient's tolerance, Monitored during session, Repositioned, Premedicated before session     Hand Dominance Right   Extremity/Trunk Assessment Upper Extremity Assessment Upper Extremity Assessment: RUE deficits/detail;LUE deficits/detail RUE Deficits / Details: Overall strength 4/5; decreased ROM in fifth digit of R hand at baseline RUE Coordination: decreased fine motor (likely at baseline) LUE Deficits / Details: Overall strength 4/5; decreased ROM in all digits of L hand at baseline LUE Coordination: decreased fine motor (likely at baseline)   Lower Extremity Assessment Lower Extremity Assessment: Defer to PT evaluation RLE Deficits / Details: pt is paraplegic   Cervical / Trunk Assessment Cervical / Trunk Assessment: Kyphotic;Neck Surgery   Communication Communication Communication: No difficulties   Cognition Arousal/Alertness: Awake/alert Behavior During Therapy: WFL for tasks assessed/performed Overall Cognitive Status: Within Functional Limits for tasks assessed                                 General Comments: AAOx4 and pleasant throughout session.     General Comments  Pt reported feeling frustrated and with increased stress secondary to pain due to abstaining from drug use and emotional difficulty of quitting drug use "cold [place]" with his wife. Pt reports he is agreeable to mental health support from acute hospital staff. Case manager and chaplain services notified. VSS on RA throughout session.    Exercises     Shoulder Instructions      Home Living  Family/patient expects to be discharged to:: Private residence Living Arrangements: Spouse/significant other;Children (adult children) Available Help at Discharge: Family Type of Home: House Home Access: Level entry;Ramped entrance     Home Layout: Other (Comment) (split level)     Bathroom Shower/Tub: Other (comment) (Pt unable to get into bathroom)     Bathroom Accessibility: No   Home Equipment: Wheelchair - manual   Additional Comments: Pt reports spending the majority of his time in the bed at baseline.      Prior Functioning/Environment Prior Level of Function : Needs assist             Mobility Comments: Mod I to Mod assist for funcitonal mobility with a wheelchair ADLs Comments: At baseline, pt completes UB ADLs Independent to Min assist and LB ADLs with Min to Mod assist. Pt uses a urinary catheter at home. Pt reports dressing and bathing from bed level at baseline.        OT Problem List: Decreased strength;Decreased activity tolerance;Pain      OT Treatment/Interventions: Self-care/ADL training;Therapeutic exercise;Energy conservation;DME and/or AE instruction;Therapeutic activities;Patient/family education    OT Goals(Current goals can be found in the care plan section) Acute Rehab OT Goals Patient Stated Goal: To return home with spouse and continue to not use drugs OT Goal Formulation: With patient Time For Goal Achievement:  05/01/23 Potential to Achieve Goals: Good ADL Goals Pt Will Perform Lower Body Bathing: with mod assist;bed level Pt Will Perform Lower Body Dressing: with min assist;bed level Pt/caregiver will Perform Home Exercise Program: Both right and left upper extremity;With theraband;Independently;With written HEP provided (to increase activity tolerance for carryover to functional tasks)  OT Frequency: Min 1X/week    Co-evaluation PT/OT/SLP Co-Evaluation/Treatment: Yes Reason for Co-Treatment: Complexity of the patient's impairments  (multi-system involvement);For patient/therapist safety PT goals addressed during session: Mobility/safety with mobility;Balance OT goals addressed during session: ADL's and self-care      AM-PAC OT "6 Clicks" Daily Activity     Outcome Measure Help from another person eating meals?: None Help from another person taking care of personal grooming?: None Help from another person toileting, which includes using toliet, bedpan, or urinal?: A Lot Help from another person bathing (including washing, rinsing, drying)?: A Lot Help from another person to put on and taking off regular upper body clothing?: A Little Help from another person to put on and taking off regular lower body clothing?: A Lot 6 Click Score: 17   End of Session Nurse Communication: Mobility status;Other (comment) (Pt premedicated before session)  Activity Tolerance: No increased pain;Patient limited by fatigue Patient left: in bed;with call bell/phone within reach  OT Visit Diagnosis: Other abnormalities of gait and mobility (R26.89);Pain;Other (comment) (Decreased activity tolerance)                Time: 4010-2725 OT Time Calculation (min): 35 min Charges:  OT General Charges $OT Visit: 1 Visit OT Evaluation $OT Eval Low Complexity: 1 Low OT Treatments $Self Care/Home Management : 8-22 mins  Dustee Bottenfield "Orson Eva., OTR/L, MA Acute Rehab [phone]   Lendon Colonel 04/17/2023, 3:51 PM

## 2023-04-17 NOTE — Progress Notes (Signed)
PROGRESS NOTE    Steve [name]  EAV:[id number] DOB: Sep 24, 1990 DOA: 04/08/2023 PCP: [facility]   Brief Narrative:  32 year old male with past medical history as below which is significant for paraplegia, substance abuse issues, psychostimulant induced psychosis, neurogenic bladder, chronic hypotension on midodrine, chronic bilateral hip ulcers with underlying osteomyelitis-presented with purulent drainage from his bilateral pressure wounds-imaging reviewed left hip septic arthritis/osteomyelitis-he was found to have refractory hypotension secondary to septic shock-and was subsequently admitted to the ICU.  Patient was stabilized-and subsequently transferred to [facility] service.  He was evaluated by infectious disease with recommendations for 6 weeks of IV daptomycin/Rocephin.  PICC line was inserted on 7/29.  Due to his prior history of drug use-he is not a candidate for outpatient IV antibiotics-social worker following for placement.    Assessment & Plan: Septic shock-POA Acute on chronic osteomyelitis septic arthritis of the left hip  Polymicrobial bacteremia with with MRSA, Group C Streptococcus and Proteus mirabilis  Proteus mirabilis UTI Sepsis physiology has resolved Per orthopedics-no indication for procedure. Per ID-no need for TEE-as patient will require prolonged course of antibiotics for osteomyelitis.  TTE was negative for endocarditis. ID recommended daptomycin/Rocephin-EOT is 9/4 Not a candidate for outpatient IV antibiotics given drug use-social worker following to see if patient can be placed to [place] facility. Wound care per wound care   Acute on chronic normocytic anemia  Secondary to acute illness-superimposed on anemia of chronic disease Continue to follow CBC-last transfusion on 7/29.  Hypotension BP stable on midodrine.  Hyponatremia Felt to be secondary to excessive free water intake-no labs done today-will plan on repeating labs tomorrow-in the  interim-fluid restrict.   GERD PPI  Vitamin D deficiency continue with supplements  Vitamin B12 deficiency Continue supplementation  Vitamin A, C, D deficiency, zinc deficiency -Continue with supplements   Neurogenic bladder/UTI Paraplegia -Continue Foley, urine culture growing Proteus, continue with Rocephin   Schizophrenia/PTSD Seems stable Continue Abilify  Substance abuse  Self reports use of methamphetamines   Pressure ulcers -Continue with wound care, started on vitamin C and zinc Pressure Injury 05/30/21 Ankle Left;Lateral Unstageable - Full thickness tissue loss in which the base of the injury is covered by slough (yellow, tan, gray, green or brown) and/or eschar (tan, brown or black) in the wound bed. (Active)  05/30/21 1938  Location: Ankle  Location Orientation: Left;Lateral  Staging: Unstageable - Full thickness tissue loss in which the base of the injury is covered by slough (yellow, tan, gray, green or brown) and/or eschar (tan, brown or black) in the wound bed.  Wound Description (Comments):   Present on Admission: Yes     Pressure Injury 08/29/21 Ischial tuberosity Left Stage 4 - Full thickness tissue loss with exposed bone, tendon or muscle. [id number] (Active)  08/29/21 0100  Location: Ischial tuberosity  Location Orientation: Left  Staging: Stage 4 - Full thickness tissue loss with exposed bone, tendon or muscle.  Wound Description (Comments): [id number]  Present on Admission: Yes     Pressure Injury 04/09/23 Pretibial Right;Lateral Stage 2 -  Partial thickness loss of dermis presenting as a shallow open injury with a red, pink wound bed without slough. full thickness, NOT a pressure injury (Active)  04/09/23 0351  Location: Pretibial  Location Orientation: Right;Lateral  Staging: Stage 2 -  Partial thickness loss of dermis presenting as a shallow open injury with a red, pink wound bed without slough.  Wound Description (Comments): full thickness, NOT a  pressure injury  Present on  Admission: Yes     Pressure Injury 04/09/23 Ischial tuberosity Right Stage 4 - Full thickness tissue loss with exposed bone, tendon or muscle. (Active)  04/09/23 0100  Location: Ischial tuberosity  Location Orientation: Right  Staging: Stage 4 - Full thickness tissue loss with exposed bone, tendon or muscle.  Wound Description (Comments):   Present on Admission: Yes     Pressure Injury 04/09/23 Elbow Left;Posterior Stage 2 -  Partial thickness loss of dermis presenting as a shallow open injury with a red, pink wound bed without slough. (Active)  04/09/23 0330  Location: Elbow  Location Orientation: Left;Posterior  Staging: Stage 2 -  Partial thickness loss of dermis presenting as a shallow open injury with a red, pink wound bed without slough.  Wound Description (Comments):   Present on Admission: Yes    DVT prophylaxis: enoxaparin (LOVENOX) injection 40 mg Start: 04/16/23 2200 SCDs Start: 04/09/23 0140   Code Status:   Code Status: Full Code  Family Communication: None at bedside  Status is: Inpatient  Dispo: The patient is from: Home              Anticipated d/c is to: SNF              Anticipated d/c date is:               Patient currentlymedically stable for discharge  Consultants:  PCCM, ID, orthopedic  Procedures:  None   Subjective: Lying comfortably in bed-no chest pain or shortness of breath.  Has no complaints.  Objective: Vitals:   04/17/23 0000 04/17/23 0400 04/17/23 0700 04/17/23 0800  BP: 127/81 116/69  111/70  Pulse:    (!) 103  Resp: 20 18  17   Temp:  98.1 F (36.7 C)  99.3 F (37.4 C)  TempSrc:  Oral  Oral  SpO2:    96%  Weight:   76.8 kg   Height:        Intake/Output Summary (Last 24 hours) at 04/17/2023 0923 Last data filed at 04/17/2023 4782 Gross per 24 hour  Intake 1460 ml  Output 2100 ml  Net -640 ml   Filed Weights   04/11/23 0400 04/14/23 0500 04/17/23 0700  Weight: 76 kg 77 kg 76.8 kg     Examination: Gen Exam:Alert awake-not in any distress HEENT:atraumatic, normocephalic Chest: B/L clear to auscultation anteriorly CVS:S1S2 regular Abdomen:soft non tender, non distended Extremities:no edema Neurology: Paraplegic Skin: no rash  Data Reviewed: I have personally reviewed following labs and imaging studies  CBC: Recent Labs  Lab 04/12/23 0540 04/13/23 0435 04/14/23 0225 04/15/23 0637 04/16/23 2345  WBC 7.3 7.6 4.9 6.7 7.5  HGB 7.4* 7.4* 6.1* 8.5* 7.7*  HCT 23.4* 23.4* 19.4* 26.4* 24.0*  MCV 87.3 87.6 86.2 86.6 88.9  PLT 248 269 195 306 259   Basic Metabolic Panel: Recent Labs  Lab 04/12/23 0540 04/13/23 0435 04/14/23 0225 04/15/23 0637 04/16/23 0345 04/16/23 2345  NA 126* 125* 125* 127* 127* 127*  K 4.4 4.2 4.1 4.1 3.6 4.1  CL 93* 92* 93* 97* 96* 94*  CO2 26 25 23 24 22 24   GLUCOSE 99 92 130* 97 95 91  BUN 13 16 13 9 11 9   CREATININE 0.54* 0.58* 0.71 0.52* 0.63 0.57*  CALCIUM 7.5* 7.4* 7.1* 7.6* 7.5* 7.5*  PHOS 3.9 4.4 2.5  --   --   --    GFR: Estimated Creatinine Clearance: 139.9 mL/min (A) (by C-G formula based on SCr of 0.57 mg/dL (  L)). Liver Function Tests: No results for input(s): "AST", "ALT", "ALKPHOS", "BILITOT", "PROT", "ALBUMIN" in the last 168 hours.  Recent Labs  Lab 04/11/23 0606  LIPASE 26   No results for input(s): "AMMONIA" in the last 168 hours. Coagulation Profile: No results for input(s): "INR", "PROTIME" in the last 168 hours.  Cardiac Enzymes: Recent Labs  Lab 04/12/23 0540  CKTOTAL 110   BNP (last 3 results) No results for input(s): "PROBNP" in the last 8760 hours. HbA1C: No results for input(s): "HGBA1C" in the last 72 hours. CBG: Recent Labs  Lab 04/10/23 1121  GLUCAP 122*   Lipid Profile: No results for input(s): "CHOL", "HDL", "LDLCALC", "TRIG", "CHOLHDL", "LDLDIRECT" in the last 72 hours. Thyroid Function Tests: No results for input(s): "TSH", "T4TOTAL", "FREET4", "T3FREE", "THYROIDAB" in the  last 72 hours. Anemia Panel: Recent Labs    04/15/23 0637  VITAMINB12 284  FOLATE 8.6    Sepsis Labs: No results for input(s): "PROCALCITON", "LATICACIDVEN" in the last 168 hours.   Recent Results (from the past 240 hour(s))  Culture, blood (Routine x 2)     Status: None   Collection Time: 04/08/23  6:22 PM   Specimen: BLOOD  Result Value Ref Range Status   Specimen Description BLOOD SITE NOT SPECIFIED  Final   Special Requests   Final    BOTTLES DRAWN AEROBIC ONLY Blood Culture results may not be optimal due to an inadequate volume of blood received in culture bottles   Culture   Final    NO GROWTH 5 DAYS Performed at [facility], [address] [place], [place] [address]    Report Status 04/13/2023 FINAL  Final  Culture, blood (Routine x 2)     Status: Abnormal (Preliminary result)   Collection Time: 04/08/23  8:08 PM   Specimen: BLOOD  Result Value Ref Range Status   Specimen Description BLOOD SITE NOT SPECIFIED  Final   Special Requests   Final    BOTTLES DRAWN AEROBIC AND ANAEROBIC Blood Culture adequate volume   Culture  Setup Time   Final    GRAM POSITIVE COCCI IN CHAINS IN BOTH AEROBIC AND ANAEROBIC BOTTLES CRITICAL RESULT CALLED TO, READ BACK BY AND VERIFIED WITH: E. SINCLAIR PHARMD, AT 0940 D. VANHOOK GRAM POSITIVE COCCI IN CLUSTERS AEROBIC BOTTLE ONLY CRITICAL RESULT CALLED TO, READ BACK BY AND VERIFIED WITH: PHARMD EMILY S. [id number] FCP    Culture (A)  Final    METHICILLIN RESISTANT STAPHYLOCOCCUS AUREUS STREPTOCOCCUS GROUP C PROTEUS MIRABILIS CRITICAL RESULT CALLED TO, READ BACK BY AND VERIFIED WITH: PHARMD E.SINCLAIR AT 0940 ON 04/11/2023 BY T.SAAD. Sent to [facility] for further susceptibility testing. Performed at [facility], [address], [place], [place] [address]    Report Status PENDING  Incomplete   Organism ID, Bacteria METHICILLIN RESISTANT STAPHYLOCOCCUS AUREUS  Final   Organism ID, Bacteria STREPTOCOCCUS GROUP C  Final    Organism ID, Bacteria PROTEUS MIRABILIS  Final      Susceptibility   Methicillin resistant staphylococcus aureus - MIC*    CIPROFLOXACIN >=8 RESISTANT Resistant     ERYTHROMYCIN >=8 RESISTANT Resistant     GENTAMICIN <=0.5 SENSITIVE Sensitive     OXACILLIN >=4 RESISTANT Resistant     TETRACYCLINE >=16 RESISTANT Resistant     VANCOMYCIN <=0.5 SENSITIVE Sensitive     TRIMETH/SULFA 160 RESISTANT Resistant     CLINDAMYCIN RESISTANT Resistant     RIFAMPIN <=0.5 SENSITIVE Sensitive     Inducible Clindamycin POSITIVE Resistant  LINEZOLID 2 SENSITIVE Sensitive     * METHICILLIN RESISTANT STAPHYLOCOCCUS AUREUS   Proteus mirabilis - MIC*    AMPICILLIN <=2 SENSITIVE Sensitive     CEFEPIME <=0.12 SENSITIVE Sensitive     CEFTAZIDIME <=1 SENSITIVE Sensitive     CEFTRIAXONE <=0.25 SENSITIVE Sensitive     CIPROFLOXACIN <=0.25 SENSITIVE Sensitive     GENTAMICIN <=1 SENSITIVE Sensitive     IMIPENEM 2 SENSITIVE Sensitive     TRIMETH/SULFA <=20 SENSITIVE Sensitive     AMPICILLIN/SULBACTAM <=2 SENSITIVE Sensitive     PIP/TAZO <=4 SENSITIVE Sensitive     * PROTEUS MIRABILIS   Streptococcus group c - MIC*    CLINDAMYCIN RESISTANT Resistant     AMPICILLIN <=0.25 SENSITIVE Sensitive     ERYTHROMYCIN 2 RESISTANT Resistant     VANCOMYCIN 0.25 SENSITIVE Sensitive     CEFTRIAXONE <=0.12 SENSITIVE Sensitive     LEVOFLOXACIN 0.5 SENSITIVE Sensitive     PENICILLIN <=0.06 SENSITIVE Sensitive     * STREPTOCOCCUS GROUP C  Blood Culture ID Panel (Reflexed)     Status: Abnormal   Collection Time: 04/08/23  8:08 PM  Result Value Ref Range Status   Enterococcus faecalis NOT DETECTED NOT DETECTED Final   Enterococcus Faecium NOT DETECTED NOT DETECTED Final   Listeria monocytogenes NOT DETECTED NOT DETECTED Final   Staphylococcus species NOT DETECTED NOT DETECTED Final   Staphylococcus aureus (BCID) NOT DETECTED NOT DETECTED Final   Staphylococcus epidermidis NOT DETECTED NOT DETECTED Final   Staphylococcus  lugdunensis NOT DETECTED NOT DETECTED Final   Streptococcus species DETECTED (A) NOT DETECTED Final    Comment: Not Enterococcus species, Streptococcus agalactiae, Streptococcus pyogenes, or Streptococcus pneumoniae. CRITICAL RESULT CALLED TO, READ BACK BY AND VERIFIED WITH: E. SINCLAIR PHARMD, AT 0940 D. VANHOOK    Streptococcus agalactiae NOT DETECTED NOT DETECTED Final   Streptococcus pneumoniae NOT DETECTED NOT DETECTED Final   Streptococcus pyogenes NOT DETECTED NOT DETECTED Final   A.calcoaceticus-baumannii NOT DETECTED NOT DETECTED Final   Bacteroides fragilis NOT DETECTED NOT DETECTED Final   Enterobacterales NOT DETECTED NOT DETECTED Final   Enterobacter cloacae complex NOT DETECTED NOT DETECTED Final   Escherichia coli NOT DETECTED NOT DETECTED Final   Klebsiella aerogenes NOT DETECTED NOT DETECTED Final   Klebsiella oxytoca NOT DETECTED NOT DETECTED Final   Klebsiella pneumoniae NOT DETECTED NOT DETECTED Final   Proteus species NOT DETECTED NOT DETECTED Final   Salmonella species NOT DETECTED NOT DETECTED Final   Serratia marcescens NOT DETECTED NOT DETECTED Final   Haemophilus influenzae NOT DETECTED NOT DETECTED Final   Neisseria meningitidis NOT DETECTED NOT DETECTED Final   Pseudomonas aeruginosa NOT DETECTED NOT DETECTED Final   Stenotrophomonas maltophilia NOT DETECTED NOT DETECTED Final   Candida albicans NOT DETECTED NOT DETECTED Final   Candida auris NOT DETECTED NOT DETECTED Final   Candida glabrata NOT DETECTED NOT DETECTED Final   Candida krusei NOT DETECTED NOT DETECTED Final   Candida parapsilosis NOT DETECTED NOT DETECTED Final   Candida tropicalis NOT DETECTED NOT DETECTED Final   Cryptococcus neoformans/gattii NOT DETECTED NOT DETECTED Final    Comment: Performed at [facility], [address], [place], [place] [address]  Blood Culture ID Panel (Reflexed)     Status: Abnormal   Collection Time: 04/08/23  8:08 PM  Result Value Ref Range Status    Enterococcus faecalis NOT DETECTED NOT DETECTED Final   Enterococcus Faecium NOT DETECTED NOT DETECTED Final   Listeria monocytogenes NOT DETECTED NOT DETECTED  Final   Staphylococcus species DETECTED (A) NOT DETECTED Final    Comment: CRITICAL RESULT CALLED TO, READ BACK BY AND VERIFIED WITH: PHARMD EMILY S. [phone] FCP    Staphylococcus aureus (BCID) DETECTED (A) NOT DETECTED Final    Comment: Methicillin (oxacillin)-resistant Staphylococcus aureus (MRSA). MRSA is predictably resistant to beta-lactam antibiotics (except ceftaroline). Preferred therapy is vancomycin unless clinically contraindicated. Patient requires contact precautions if  hospitalized. CRITICAL RESULT CALLED TO, READ BACK BY AND VERIFIED WITH: PHARMD EMILY S. [phone] FCP    Staphylococcus epidermidis NOT DETECTED NOT DETECTED Final   Staphylococcus lugdunensis NOT DETECTED NOT DETECTED Final   Streptococcus species DETECTED (A) NOT DETECTED Final    Comment: Not Enterococcus species, Streptococcus agalactiae, Streptococcus pyogenes, or Streptococcus pneumoniae. CRITICAL RESULT CALLED TO, READ BACK BY AND VERIFIED WITH: PHARMD EMILY S. [phone] FCP    Streptococcus agalactiae NOT DETECTED NOT DETECTED Final   Streptococcus pneumoniae NOT DETECTED NOT DETECTED Final   Streptococcus pyogenes NOT DETECTED NOT DETECTED Final   A.calcoaceticus-baumannii NOT DETECTED NOT DETECTED Final   Bacteroides fragilis NOT DETECTED NOT DETECTED Final   Enterobacterales NOT DETECTED NOT DETECTED Final   Enterobacter cloacae complex NOT DETECTED NOT DETECTED Final   Escherichia coli NOT DETECTED NOT DETECTED Final   Klebsiella aerogenes NOT DETECTED NOT DETECTED Final   Klebsiella oxytoca NOT DETECTED NOT DETECTED Final   Klebsiella pneumoniae NOT DETECTED NOT DETECTED Final   Proteus species NOT DETECTED NOT DETECTED Final   Salmonella species NOT DETECTED NOT DETECTED Final   Serratia marcescens NOT DETECTED NOT DETECTED  Final   Haemophilus influenzae NOT DETECTED NOT DETECTED Final   Neisseria meningitidis NOT DETECTED NOT DETECTED Final   Pseudomonas aeruginosa NOT DETECTED NOT DETECTED Final   Stenotrophomonas maltophilia NOT DETECTED NOT DETECTED Final   Candida albicans NOT DETECTED NOT DETECTED Final   Candida auris NOT DETECTED NOT DETECTED Final   Candida glabrata NOT DETECTED NOT DETECTED Final   Candida krusei NOT DETECTED NOT DETECTED Final   Candida parapsilosis NOT DETECTED NOT DETECTED Final   Candida tropicalis NOT DETECTED NOT DETECTED Final   Cryptococcus neoformans/gattii NOT DETECTED NOT DETECTED Final   Meth resistant mecA/C and MREJ DETECTED (A) NOT DETECTED Final    Comment: CRITICAL RESULT CALLED TO, READ BACK BY AND VERIFIED WITH: PHARMD EMILY S. [id number] FCP Performed at [facility], [address], [place], [place] [address]   Urine Culture     Status: Abnormal   Collection Time: 04/09/23 12:16 AM   Specimen: Urine, Random  Result Value Ref Range Status   Specimen Description URINE, RANDOM  Final   Special Requests   Final    NONE Reflexed from [id number] Performed at [facility], [address], [place], [place] [address]    Culture (A)  Final    >=100,000 COLONIES/mL PROTEUS MIRABILIS 30,000 COLONIES/mL ESCHERICHIA COLI    Report Status 04/13/2023 FINAL  Final   Organism ID, Bacteria ESCHERICHIA COLI (A)  Final   Organism ID, Bacteria PROTEUS MIRABILIS (A)  Final      Susceptibility   Escherichia coli - MIC*    AMPICILLIN >=32 RESISTANT Resistant     CEFAZOLIN <=4 SENSITIVE Sensitive     CEFEPIME <=0.12 SENSITIVE Sensitive     CEFTRIAXONE <=0.25 SENSITIVE Sensitive     CIPROFLOXACIN >=4 RESISTANT Resistant     GENTAMICIN <=1 SENSITIVE Sensitive     IMIPENEM <=0.25 SENSITIVE Sensitive     NITROFURANTOIN <=  16 SENSITIVE Sensitive     TRIMETH/SULFA >=320 RESISTANT Resistant     AMPICILLIN/SULBACTAM 4 SENSITIVE Sensitive     PIP/TAZO <=4 SENSITIVE  Sensitive     * 30,000 COLONIES/mL ESCHERICHIA COLI   Proteus mirabilis - MIC*    AMPICILLIN 16 INTERMEDIATE Intermediate     CEFAZOLIN <=4 SENSITIVE Sensitive     CEFEPIME <=0.12 SENSITIVE Sensitive     CEFTRIAXONE <=0.25 SENSITIVE Sensitive     CIPROFLOXACIN <=0.25 SENSITIVE Sensitive     GENTAMICIN <=1 SENSITIVE Sensitive     IMIPENEM 1 SENSITIVE Sensitive     NITROFURANTOIN 128 RESISTANT Resistant     TRIMETH/SULFA <=20 SENSITIVE Sensitive     AMPICILLIN/SULBACTAM 8 SENSITIVE Sensitive     * >=100,000 COLONIES/mL PROTEUS MIRABILIS  Culture, blood (Routine X 2) w Reflex to ID Panel     Status: None   Collection Time: 04/10/23  6:50 AM   Specimen: BLOOD LEFT HAND  Result Value Ref Range Status   Specimen Description BLOOD LEFT HAND  Final   Special Requests   Final    BOTTLES DRAWN AEROBIC AND ANAEROBIC Blood Culture adequate volume   Culture   Final    NO GROWTH 5 DAYS Performed at [facility], [address] [place], [place] [address]    Report Status 04/15/2023 FINAL  Final  Culture, blood (Routine X 2) w Reflex to ID Panel     Status: None   Collection Time: 04/10/23  6:50 AM   Specimen: BLOOD LEFT HAND  Result Value Ref Range Status   Specimen Description BLOOD LEFT HAND  Final   Special Requests   Final    BOTTLES DRAWN AEROBIC AND ANAEROBIC Blood Culture adequate volume   Culture   Final    NO GROWTH 5 DAYS Performed at [facility], [address], [place], [place] [address]    Report Status 04/15/2023 FINAL  Final         Radiology Studies: IR PICC PLACEMENT RIGHT >5 YRS INC IMG GUIDE  Result Date: 04/15/2023 INDICATION: 32 year old male with bacteremia who is in need of long-term IV antibiotic treatment. Request for PICC line placement. EXAM: ULTRASOUND AND FLUOROSCOPIC GUIDED PICC LINE INSERTION MEDICATIONS: 1% lidocaine CONTRAST:  None FLUOROSCOPY TIME:  18 seconds (4 mGy) COMPLICATIONS: None immediate. TECHNIQUE: The procedure, risks,  benefits, and alternatives were explained to the patient and informed written consent was obtained. The right upper extremity was prepped with chlorhexidine in a sterile fashion, and a sterile drape was applied covering the operative field. Maximum barrier sterile technique with sterile gowns and gloves were used for the procedure. A timeout was performed prior to the initiation of the procedure. Local anesthesia was provided with 1% lidocaine. After the overlying soft tissues were anesthetized with 1% lidocaine, a micropuncture kit was utilized to access the right brachial vein. Real-time ultrasound guidance was utilized for vascular access including the acquisition of a permanent ultrasound image documenting patency of the accessed vessel. A guidewire was advanced to the level of the superior caval-atrial junction for measurement purposes and the PICC line was cut to length. A peel-away sheath was placed and a 36 cm, 5 [place], dual lumen was inserted to level of the superior caval-atrial junction. A post procedure spot fluoroscopic was obtained. The catheter easily aspirated and flushed and was secured in place. A dressing was placed. The patient tolerated the procedure well without immediate post procedural complication. FINDINGS: After catheter placement, the tip lies within the  superior cavoatrial junction. The catheter aspirates and flushes normally and is ready for immediate use. IMPRESSION: Successful ultrasound and fluoroscopic guided placement of a right brachial vein approach, 36 cm, 5 French, dual lumen PICC with tip at the superior caval-atrial junction. The PICC line is ready for immediate use. Performed by: Lawernce Ion, PA-C Electronically Signed   By: Corlis Leak M.D.   On: 04/15/2023 16:41     Scheduled Meds:  ARIPiprazole  15 mg Oral Daily   ascorbic acid  500 mg Oral Daily   Chlorhexidine Gluconate Cloth  6 each Topical Daily   cholecalciferol  1,000 Units Oral Daily   cyanocobalamin  1,000  mcg Subcutaneous Daily   Followed by   Melene Muller ON 04/23/2023] cyanocobalamin  1,000 mcg Subcutaneous Q30 days   enoxaparin (LOVENOX) injection  40 mg Subcutaneous Q24H   feeding supplement  237 mL Oral TID BM   leptospermum manuka honey  1 Application Topical Daily   lidocaine (PF)  10 mL Infiltration Once   midodrine  10 mg Oral Q8H   multivitamin with minerals  1 tablet Oral Daily   nutrition supplement (JUVEN)  1 packet Oral BID BM   nystatin   Topical TID   pantoprazole  40 mg Oral Daily   vitamin A  10,000 Units Oral Daily   vitamin E  400 Units Oral Daily   zinc sulfate  220 mg Oral Daily   Continuous Infusions:  cefTRIAXone (ROCEPHIN)  IV 2 g (04/16/23 0851)   DAPTOmycin (CUBICIN) 600 mg in sodium chloride 0.9 % IVPB 600 mg (04/16/23 1647)     LOS: 8 days   Jeoffrey Massed, MD [facility]  If 7PM-7AM, please contact night-coverage [email or web address]  04/17/2023, 9:23 AM

## 2023-04-17 NOTE — Progress Notes (Signed)
   04/17/23 1212  Assess: MEWS Score  Temp 100.3 F (37.9 C)  BP (!) 82/45  MAP (mmHg) (!) 57  Pulse Rate (!) 116  ECG Heart Rate (!) 116  Resp 18  Level of Consciousness Alert  SpO2 97 %  O2 Device Room Air  Assess: MEWS Score  MEWS Temp 0  MEWS Systolic 1  MEWS Pulse 2  MEWS RR 0  MEWS LOC 0  MEWS Score 3  MEWS Score Color Yellow  Assess: if the MEWS score is Yellow or Red  Were vital signs accurate and taken at a resting state? Yes  Does the patient meet 2 or more of the SIRS criteria? No  Does the patient have a confirmed or suspected source of infection? Yes  MEWS guidelines implemented  Yes, yellow  Treat  MEWS Interventions Considered administering scheduled or prn medications/treatments as ordered  Take Vital Signs  Increase Vital Sign Frequency  Yellow: Q2hr x1, continue Q4hrs until patient remains green for 12hrs  Escalate  MEWS: Escalate Yellow: Discuss with charge nurse and consider notifying provider and/or RRT  Notify: Charge Nurse/RN  Name of Charge Nurse/RN Notified Horris Latino RN  Provider Notification  Provider Name/Title Dr. Jerral Ralph  Date Provider Notified 04/17/23  Time Provider Notified 1228  Method of Notification Call (secure chat)  Notification Reason Change in status  Provider response In department  Date of Provider Response 04/17/23  Time of Provider Response 1233  Assess: SIRS CRITERIA  SIRS Temperature  0  SIRS Pulse 1  SIRS Respirations  0  SIRS WBC 0  SIRS Score Sum  1

## 2023-04-17 NOTE — Evaluation (Signed)
Physical Therapy Evaluation Patient Details Name: Steve [name] MRN: [id number] DOB: 1991-05-20 Today's Date: 04/17/2023  History of Present Illness  Pt is 32 yo male presenting to [facility] ER on 7/22 with reprots of weakness and malaise. Pt was found to have polymicrobial bacteremia. PMH: paraplegia, psychostimulant induced psychosis, neurogenic bladder, chronic hypotension on midodrine, chronic osteomyelitis, paranoid schizophrenia and PTSD. Pt recently admitted for infections to [facility] of bil ischial wounds.  Clinical Impression  Pt is presenting below baseline. Currently pt is Min A for bed mobility. Pt demonstrates good UE strength by lifting trunk off bed to assist with linen change. Pt is at a high risk for further break down due to current mobility status and impaired activity tolerance. Due to pt current functional status recommend skilled physical therapy services at a higher level of care and frequency 5x/weekly in order to improve functional mobility to promote independence, decrease burden of care, improve skin integrity and decrease risk for re-hospitalization.         If plan is discharge home, recommend the following: Two people to help with walking and/or transfers;Assistance with cooking/housework;Help with stairs or ramp for entrance;A lot of help with walking and/or transfers   Can travel by private vehicle   No    Equipment Recommendations Other (comment) (defer to post acute)  Recommendations for Other Services       Functional Status Assessment Patient has had a recent decline in their functional status and demonstrates the ability to make significant improvements in function in a reasonable and predictable amount of time.     Precautions / Restrictions Precautions Precautions: Fall Restrictions Weight Bearing Restrictions: No      Mobility  Bed Mobility Overal bed mobility: Needs Assistance Bed Mobility: Supine to Sit, Sit to Supine     Supine to sit: Min  guard Sit to supine: Min assist   General bed mobility comments: Pt is able to lift bil LE to perform supine to sitting but requires Min A to lift bil LE for sitting to supine. CGA to scoot EOB. Patient Response: Cooperative  Transfers   General transfer comment: Pt deferred transfer this session.    Ambulation/Gait     General Gait Details: Pt si non-ambulatory at baseline.  Stairs Stairs:  (pt is non-ambulatory at baseline)          Wheelchair Mobility     Tilt Bed Tilt Bed Patient Response: Cooperative  Modified Rankin (Stroke Patients Only)       Balance Overall balance assessment: Needs assistance Sitting-balance support: Bilateral upper extremity supported Sitting balance-Leahy Scale: Fair Sitting balance - Comments: pt has significant forward bend at the trunk. No overt LOB         Pertinent Vitals/Pain Pain Assessment Pain Assessment: Faces Faces Pain Scale: Hurts even more Pain Descriptors / Indicators: Grimacing Pain Intervention(s): Limited activity within patient's tolerance, Monitored during session, Premedicated before session    Home Living Family/patient expects to be discharged to:: Private residence Living Arrangements: Spouse/significant other;Children (adult children) Available Help at Discharge: Family Type of Home: House Home Access: Level entry;Ramped entrance       Home Layout: Other (Comment) (split level) Home Equipment: Wheelchair - manual      Prior Function   Mobility Comments: Mod I to Mod assist for funcitonal mobility with a wheelchair ADLs Comments: Has a urinary cath at home     Hand Dominance   Dominant Hand: Right    Extremity/Trunk Assessment   Upper Extremity Assessment Upper  Extremity Assessment: Defer to OT evaluation    Lower Extremity Assessment Lower Extremity Assessment: RLE deficits/detail;LLE deficits/detail RLE Deficits / Details: pt is paraplegic    Cervical / Trunk Assessment Cervical  / Trunk Assessment: Kyphotic;Neck Surgery  Communication      Cognition Arousal/Alertness: Awake/alert Behavior During Therapy: WFL for tasks assessed/performed Overall Cognitive Status: Within Functional Limits for tasks assessed      General Comments General comments (skin integrity, edema, etc.): Pt spoke about feeling frustrated with pain due to abstaining from drug use. Discussed how difficult it is due to him and spouse both "cold [place]" quit. Pt states he would like to have someone to talk to and agreed to speaking with someone. Case management and chaplain services notified.        Assessment/Plan    PT Assessment Patient needs continued PT services  PT Problem List Decreased mobility;Decreased range of motion;Decreased activity tolerance;Pain       PT Treatment Interventions DME instruction;Therapeutic activities;Therapeutic exercise;Patient/family education;Balance training;Wheelchair mobility training;Functional mobility training;Neuromuscular re-education;Manual techniques    PT Goals (Current goals can be found in the Care Plan section)  Acute Rehab PT Goals Patient Stated Goal: To go home to spouse PT Goal Formulation: With patient Time For Goal Achievement: 05/01/23 Potential to Achieve Goals: Fair Additional Goals Additional Goal #1: Pt will wheel himself in W/C 50 ft at Min A to improve independence    Frequency Min 1X/week     Co-evaluation PT/OT/SLP Co-Evaluation/Treatment: Yes Reason for Co-Treatment: Complexity of the patient's impairments (multi-system involvement);For patient/therapist safety PT goals addressed during session: Mobility/safety with mobility;Balance         AM-PAC PT "6 Clicks" Mobility  Outcome Measure Help needed turning from your back to your side while in a flat bed without using bedrails?: A Little Help needed moving from lying on your back to sitting on the side of a flat bed without using bedrails?: A Little Help needed  moving to and from a bed to a chair (including a wheelchair)?: A Lot Help needed standing up from a chair using your arms (e.g., wheelchair or bedside chair)?: Total Help needed to walk in hospital room?: Total Help needed climbing 3-5 steps with a railing? : Total 6 Click Score: 11    End of Session   Activity Tolerance: Patient limited by pain;Other (comment) (deferred out of bed due to pt wheel chair was not available)   Nurse Communication: Mobility status PT Visit Diagnosis: Other abnormalities of gait and mobility (R26.89)    Time: [id number] PT Time Calculation (min) (ACUTE ONLY): 35 min   Charges:   PT Evaluation $PT Eval Low Complexity: 1 Low   PT General Charges $$ ACUTE PT VISIT: 1 Visit        [name], DPT, CLT  Acute Rehabilitation Services Office: [phone] (Secure chat preferred)   [name] 04/17/2023, 12:23 PM

## 2023-04-18 ENCOUNTER — Inpatient Hospital Stay (HOSPITAL_COMMUNITY): Payer: No Typology Code available for payment source

## 2023-04-18 DIAGNOSIS — M0009 Staphylococcal polyarthritis: Secondary | ICD-10-CM | POA: Diagnosis not present

## 2023-04-18 DIAGNOSIS — F1510 Other stimulant abuse, uncomplicated: Secondary | ICD-10-CM | POA: Diagnosis not present

## 2023-04-18 DIAGNOSIS — R6521 Severe sepsis with septic shock: Secondary | ICD-10-CM | POA: Diagnosis not present

## 2023-04-18 DIAGNOSIS — A419 Sepsis, unspecified organism: Secondary | ICD-10-CM | POA: Diagnosis not present

## 2023-04-18 LAB — TSH: TSH: 1.914 u[IU]/mL (ref 0.350–4.500)

## 2023-04-18 LAB — C-REACTIVE PROTEIN: CRP: 16.1 mg/dL — ABNORMAL HIGH (ref <1.0)

## 2023-04-18 LAB — OSMOLALITY: Osmolality: 269 mOsm/kg — ABNORMAL LOW (ref 275–295)

## 2023-04-18 LAB — OSMOLALITY, URINE: Osmolality, Ur: 648 mOsm/kg (ref 300–900)

## 2023-04-18 LAB — SODIUM, URINE, RANDOM: Sodium, Ur: 30 mmol/L

## 2023-04-18 LAB — PROCALCITONIN: Procalcitonin: 11.94 ng/mL

## 2023-04-18 MED ORDER — SODIUM CHLORIDE 1 G PO TABS
1.0000 g | ORAL_TABLET | Freq: Two times a day (BID) | ORAL | Status: DC
  Administered 2023-04-18 – 2023-05-03 (×29): 1 g via ORAL
  Filled 2023-04-18 (×29): qty 1

## 2023-04-18 NOTE — Plan of Care (Signed)
  Problem: Education: Goal: Knowledge of General Education information will improve Description: Including pain rating scale, medication(s)/side effects and non-pharmacologic comfort measures Outcome: Progressing   Problem: Clinical Measurements: Goal: Will remain free from infection Outcome: Progressing   Problem: Clinical Measurements: Goal: Will remain free from infection Outcome: Progressing   Problem: Clinical Measurements: Goal: Diagnostic test results will improve Outcome: Progressing   Problem: Clinical Measurements: Goal: Respiratory complications will improve Outcome: Progressing   Problem: Clinical Measurements: Goal: Respiratory complications will improve Outcome: Progressing   Problem: Clinical Measurements: Goal: Cardiovascular complication will be avoided Outcome: Progressing   Problem: Pain Managment: Goal: General experience of comfort will improve Outcome: Progressing   Problem: Safety: Goal: Ability to remain free from injury will improve Outcome: Progressing

## 2023-04-18 NOTE — Progress Notes (Signed)
PROGRESS NOTE    Steve [name]  WUJ:[id number] DOB: 12-30-1990 DOA: 04/08/2023 PCP: [facility]   Brief Narrative:  32 year old male with with history of paraplegia-known sacral decubitus ulcers with chronic osteomyelitis, substance abuse, chronic hypotension on midodrine-presented with septic shock in the setting of polymicrobial bacteremia and acute on chronic osteomyelitis of sacral decubitus ulcers and left hip septic arthritis.  Initially admitted to the ICU-stabilized and subsequently transferred to [facility].  Evaluated by orthopedics-no surgical interventions recommended.  Evaluated by ID-with plans for 6 weeks of IV daptomycin/Rocephin.  Social worker following for placement-as patient is not a candidate for outpatient IV antibiotics (history of drug use).   Assessment & Plan: Septic shock-POA Acute on chronic osteomyelitis septic arthritis of the left hip  Polymicrobial bacteremia with with MRSA, Group C Streptococcus and Proteus mirabilis  Proteus mirabilis UTI Sepsis physiology had resolved but starting to get febrile since yesterday Sacral wounds examined with RN staff today-appear unchanged without any concerning findings.  No surgical intervention recommended by orthopedics (Dr. Lajoyce Corners) Since febrile again-will repeat cultures, also check a lower extremity Doppler ultrasound to rule out DVT as a cause of fever. Will discuss with infectious disease-to see if any further workup is required-given he is febrile again Per prior documentation-no need to pursue TEE-as patient will require prolonged course of antibiotics for osteomyelitis.  Thankfully TTE was negative for endocarditis. Per last ID note-daptomycin Rocephin-EOT 9/4.     Acute on chronic normocytic anemia  Secondary to acute illness-superimposed on anemia of chronic disease Continue to follow CBC-last transfusion on 7/29.  Hypotension BP stable on midodrine.  Hyponatremia Unfortunately continues to have  worsening hyponatremia-BP somewhat soft-hence we will hold off on starting diuretics.  Continue salt tablets-decrease fluid restriction to 1.2 L today.  Thankfully he is asymptomatic. Will send out serum osmolality/urine osmolality/urine sodium/TSH/a.m. cortisol and repeat chemistries tomorrow morning.    GERD PPI  Vitamin D deficiency continue with supplements  Vitamin B12 deficiency Continue supplementation  Vitamin A, C, D deficiency, zinc deficiency -Continue with supplements   Neurogenic bladder/UTI Paraplegia Continue Foley, urine culture growing Proteus, continue with Rocephin   Schizophrenia/PTSD Seems stable Continue Abilify  Substance abuse  Self reports use of methamphetamines   Pressure ulcers -Continue with wound care, started on vitamin C and zinc Pressure Injury 05/30/21 Ankle Left;Lateral Unstageable - Full thickness tissue loss in which the base of the injury is covered by slough (yellow, tan, gray, green or brown) and/or eschar (tan, brown or black) in the wound bed. (Active)  05/30/21 1938  Location: Ankle  Location Orientation: Left;Lateral  Staging: Unstageable - Full thickness tissue loss in which the base of the injury is covered by slough (yellow, tan, gray, green or brown) and/or eschar (tan, brown or black) in the wound bed.  Wound Description (Comments):   Present on Admission: Yes     Pressure Injury 08/29/21 Ischial tuberosity Left Stage 4 - Full thickness tissue loss with exposed bone, tendon or muscle. [id number] (Active)  08/29/21 0100  Location: Ischial tuberosity  Location Orientation: Left  Staging: Stage 4 - Full thickness tissue loss with exposed bone, tendon or muscle.  Wound Description (Comments): [id number]  Present on Admission: Yes     Pressure Injury 04/09/23 Pretibial Right;Lateral Stage 2 -  Partial thickness loss of dermis presenting as a shallow open injury with a red, pink wound bed without slough. full thickness, NOT a pressure  injury (Active)  04/09/23 0351  Location: Pretibial  Location Orientation:  Right;Lateral  Staging: Stage 2 -  Partial thickness loss of dermis presenting as a shallow open injury with a red, pink wound bed without slough.  Wound Description (Comments): full thickness, NOT a pressure injury  Present on Admission: Yes     Pressure Injury 04/09/23 Ischial tuberosity Right Stage 4 - Full thickness tissue loss with exposed bone, tendon or muscle. (Active)  04/09/23 0100  Location: Ischial tuberosity  Location Orientation: Right  Staging: Stage 4 - Full thickness tissue loss with exposed bone, tendon or muscle.  Wound Description (Comments):   Present on Admission: Yes     Pressure Injury 04/09/23 Elbow Left;Posterior Stage 2 -  Partial thickness loss of dermis presenting as a shallow open injury with a red, pink wound bed without slough. (Active)  04/09/23 0330  Location: Elbow  Location Orientation: Left;Posterior  Staging: Stage 2 -  Partial thickness loss of dermis presenting as a shallow open injury with a red, pink wound bed without slough.  Wound Description (Comments):   Present on Admission: Yes    DVT prophylaxis: enoxaparin (LOVENOX) injection 40 mg Start: 04/16/23 2200 SCDs Start: 04/09/23 0140   Code Status:   Code Status: Full Code  Family Communication: None at bedside  Status is: Inpatient  Dispo: The patient is from: Home              Anticipated d/c is to: SNF              Anticipated d/c date is:               Patient currentlymedically stable for discharge  Consultants:  PCCM, ID, orthopedic (duda)  Procedures:  7/29>> PICC line   Subjective: Lying comfortably in bed-no major issues overnight except for fever.  No diarrhea.  No abdominal pain.  Objective: Vitals:   04/18/23 0300 04/18/23 0500 04/18/23 0842 04/18/23 1236  BP: (!) 99/48  102/69 (!) 105/48  Pulse:   99 (!) 108  Resp:   20 (!) 24  Temp: (!) 97.5 F (36.4 C)  100 F (37.8 C) 99.7 F  (37.6 C)  TempSrc: Oral  Oral Oral  SpO2:   100% 98%  Weight:  82.4 kg    Height:        Intake/Output Summary (Last 24 hours) at 04/18/2023 1309 Last data filed at 04/18/2023 1200 Gross per 24 hour  Intake 660 ml  Output 3300 ml  Net -2640 ml   Filed Weights   04/14/23 0500 04/17/23 0700 04/18/23 0500  Weight: 77 kg 76.8 kg 82.4 kg    Examination: Gen Exam:Alert awake-not in any distress HEENT:atraumatic, normocephalic Chest: B/L clear to auscultation anteriorly CVS:S1S2 regular Abdomen:soft non tender, non distended Extremities:no edema Neurology: Paraplegic. Skin: no rash  Data Reviewed: I have personally reviewed following labs and imaging studies  CBC: Recent Labs  Lab 04/13/23 0435 04/14/23 0225 04/15/23 0637 04/16/23 2345 04/18/23 0810  WBC 7.6 4.9 6.7 7.5 9.0  HGB 7.4* 6.1* 8.5* 7.7* 8.2*  HCT 23.4* 19.4* 26.4* 24.0* 25.8*  MCV 87.6 86.2 86.6 88.9 87.2  PLT 269 195 306 259 269   Basic Metabolic Panel: Recent Labs  Lab 04/12/23 0540 04/13/23 0435 04/14/23 0225 04/15/23 0637 04/16/23 0345 04/16/23 2345 04/18/23 0810  NA 126* 125* 125* 127* 127* 127* 126*  K 4.4 4.2 4.1 4.1 3.6 4.1 4.2  CL 93* 92* 93* 97* 96* 94* 94*  CO2 26 25 23 24 22 24 24   GLUCOSE 99 92 130* 97  95 91 76  BUN 13 16 13 9 11 9 7   CREATININE 0.54* 0.58* 0.71 0.52* 0.63 0.57* 0.49*  CALCIUM 7.5* 7.4* 7.1* 7.6* 7.5* 7.5* 7.5*  PHOS 3.9 4.4 2.5  --   --   --   --    GFR: Estimated Creatinine Clearance: 139.9 mL/min (A) (by C-G formula based on SCr of 0.49 mg/dL (L)). Liver Function Tests: No results for input(s): "AST", "ALT", "ALKPHOS", "BILITOT", "PROT", "ALBUMIN" in the last 168 hours.  No results for input(s): "LIPASE", "AMYLASE" in the last 168 hours.  No results for input(s): "AMMONIA" in the last 168 hours. Coagulation Profile: No results for input(s): "INR", "PROTIME" in the last 168 hours.  Cardiac Enzymes: Recent Labs  Lab 04/12/23 0540  CKTOTAL 110   BNP  (last 3 results) No results for input(s): "PROBNP" in the last 8760 hours. HbA1C: No results for input(s): "HGBA1C" in the last 72 hours. CBG: No results for input(s): "GLUCAP" in the last 168 hours.  Lipid Profile: No results for input(s): "CHOL", "HDL", "LDLCALC", "TRIG", "CHOLHDL", "LDLDIRECT" in the last 72 hours. Thyroid Function Tests: No results for input(s): "TSH", "T4TOTAL", "FREET4", "T3FREE", "THYROIDAB" in the last 72 hours. Anemia Panel: No results for input(s): "VITAMINB12", "FOLATE", "FERRITIN", "TIBC", "IRON", "RETICCTPCT" in the last 72 hours.   Sepsis Labs: No results for input(s): "PROCALCITON", "LATICACIDVEN" in the last 168 hours.   Recent Results (from the past 240 hour(s))  Culture, blood (Routine x 2)     Status: None   Collection Time: 04/08/23  6:22 PM   Specimen: BLOOD  Result Value Ref Range Status   Specimen Description BLOOD SITE NOT SPECIFIED  Final   Special Requests   Final    BOTTLES DRAWN AEROBIC ONLY Blood Culture results may not be optimal due to an inadequate volume of blood received in culture bottles   Culture   Final    NO GROWTH 5 DAYS Performed at [facility], [address], [place], [place] [address]    Report Status 04/13/2023 FINAL  Final  Culture, blood (Routine x 2)     Status: Abnormal (Preliminary result)   Collection Time: 04/08/23  8:08 PM   Specimen: BLOOD  Result Value Ref Range Status   Specimen Description BLOOD SITE NOT SPECIFIED  Final   Special Requests   Final    BOTTLES DRAWN AEROBIC AND ANAEROBIC Blood Culture adequate volume   Culture  Setup Time   Final    GRAM POSITIVE COCCI IN CHAINS IN BOTH AEROBIC AND ANAEROBIC BOTTLES CRITICAL RESULT CALLED TO, READ BACK BY AND VERIFIED WITH: E. SINCLAIR PHARMD, AT 0940 D. VANHOOK GRAM POSITIVE COCCI IN CLUSTERS AEROBIC BOTTLE ONLY CRITICAL RESULT CALLED TO, READ BACK BY AND VERIFIED WITH: PHARMD EMILY S. [id number] FCP    Culture (A)  Final    METHICILLIN  RESISTANT STAPHYLOCOCCUS AUREUS STREPTOCOCCUS GROUP C PROTEUS MIRABILIS CRITICAL RESULT CALLED TO, READ BACK BY AND VERIFIED WITH: PHARMD E.SINCLAIR AT 0940 ON 04/11/2023 BY T.SAAD. Sent to [facility] for further susceptibility testing. Performed at [facility], [address] [place], [place] [address]    Report Status PENDING  Incomplete   Organism ID, Bacteria METHICILLIN RESISTANT STAPHYLOCOCCUS AUREUS  Final   Organism ID, Bacteria STREPTOCOCCUS GROUP C  Final   Organism ID, Bacteria PROTEUS MIRABILIS  Final      Susceptibility   Methicillin resistant staphylococcus aureus - MIC*    CIPROFLOXACIN >=8 RESISTANT Resistant  ERYTHROMYCIN >=8 RESISTANT Resistant     GENTAMICIN <=0.5 SENSITIVE Sensitive     OXACILLIN >=4 RESISTANT Resistant     TETRACYCLINE >=16 RESISTANT Resistant     VANCOMYCIN <=0.5 SENSITIVE Sensitive     TRIMETH/SULFA 160 RESISTANT Resistant     CLINDAMYCIN RESISTANT Resistant     RIFAMPIN <=0.5 SENSITIVE Sensitive     Inducible Clindamycin POSITIVE Resistant     LINEZOLID 2 SENSITIVE Sensitive     * METHICILLIN RESISTANT STAPHYLOCOCCUS AUREUS   Proteus mirabilis - MIC*    AMPICILLIN <=2 SENSITIVE Sensitive     CEFEPIME <=0.12 SENSITIVE Sensitive     CEFTAZIDIME <=1 SENSITIVE Sensitive     CEFTRIAXONE <=0.25 SENSITIVE Sensitive     CIPROFLOXACIN <=0.25 SENSITIVE Sensitive     GENTAMICIN <=1 SENSITIVE Sensitive     IMIPENEM 2 SENSITIVE Sensitive     TRIMETH/SULFA <=20 SENSITIVE Sensitive     AMPICILLIN/SULBACTAM <=2 SENSITIVE Sensitive     PIP/TAZO <=4 SENSITIVE Sensitive     * PROTEUS MIRABILIS   Streptococcus group c - MIC*    CLINDAMYCIN RESISTANT Resistant     AMPICILLIN <=0.25 SENSITIVE Sensitive     ERYTHROMYCIN 2 RESISTANT Resistant     VANCOMYCIN 0.25 SENSITIVE Sensitive     CEFTRIAXONE <=0.12 SENSITIVE Sensitive     LEVOFLOXACIN 0.5 SENSITIVE Sensitive     PENICILLIN <=0.06 SENSITIVE Sensitive     * STREPTOCOCCUS GROUP C  Blood  Culture ID Panel (Reflexed)     Status: Abnormal   Collection Time: 04/08/23  8:08 PM  Result Value Ref Range Status   Enterococcus faecalis NOT DETECTED NOT DETECTED Final   Enterococcus Faecium NOT DETECTED NOT DETECTED Final   Listeria monocytogenes NOT DETECTED NOT DETECTED Final   Staphylococcus species NOT DETECTED NOT DETECTED Final   Staphylococcus aureus (BCID) NOT DETECTED NOT DETECTED Final   Staphylococcus epidermidis NOT DETECTED NOT DETECTED Final   Staphylococcus lugdunensis NOT DETECTED NOT DETECTED Final   Streptococcus species DETECTED (A) NOT DETECTED Final    Comment: Not Enterococcus species, Streptococcus agalactiae, Streptococcus pyogenes, or Streptococcus pneumoniae. CRITICAL RESULT CALLED TO, READ BACK BY AND VERIFIED WITH: E. SINCLAIR PHARMD, AT 0940 D. VANHOOK    Streptococcus agalactiae NOT DETECTED NOT DETECTED Final   Streptococcus pneumoniae NOT DETECTED NOT DETECTED Final   Streptococcus pyogenes NOT DETECTED NOT DETECTED Final   A.calcoaceticus-baumannii NOT DETECTED NOT DETECTED Final   Bacteroides fragilis NOT DETECTED NOT DETECTED Final   Enterobacterales NOT DETECTED NOT DETECTED Final   Enterobacter cloacae complex NOT DETECTED NOT DETECTED Final   Escherichia coli NOT DETECTED NOT DETECTED Final   Klebsiella aerogenes NOT DETECTED NOT DETECTED Final   Klebsiella oxytoca NOT DETECTED NOT DETECTED Final   Klebsiella pneumoniae NOT DETECTED NOT DETECTED Final   Proteus species NOT DETECTED NOT DETECTED Final   Salmonella species NOT DETECTED NOT DETECTED Final   Serratia marcescens NOT DETECTED NOT DETECTED Final   Haemophilus influenzae NOT DETECTED NOT DETECTED Final   Neisseria meningitidis NOT DETECTED NOT DETECTED Final   Pseudomonas aeruginosa NOT DETECTED NOT DETECTED Final   Stenotrophomonas maltophilia NOT DETECTED NOT DETECTED Final   Candida albicans NOT DETECTED NOT DETECTED Final   Candida auris NOT DETECTED NOT DETECTED Final    Candida glabrata NOT DETECTED NOT DETECTED Final   Candida krusei NOT DETECTED NOT DETECTED Final   Candida parapsilosis NOT DETECTED NOT DETECTED Final   Candida tropicalis NOT DETECTED NOT DETECTED Final   Cryptococcus neoformans/gattii NOT DETECTED NOT  DETECTED Final    Comment: Performed at [facility], [address] [place], [place] [address]  Blood Culture ID Panel (Reflexed)     Status: Abnormal   Collection Time: 04/08/23  8:08 PM  Result Value Ref Range Status   Enterococcus faecalis NOT DETECTED NOT DETECTED Final   Enterococcus Faecium NOT DETECTED NOT DETECTED Final   Listeria monocytogenes NOT DETECTED NOT DETECTED Final   Staphylococcus species DETECTED (A) NOT DETECTED Final    Comment: CRITICAL RESULT CALLED TO, READ BACK BY AND VERIFIED WITH: PHARMD EMILY S. [phone] FCP    Staphylococcus aureus (BCID) DETECTED (A) NOT DETECTED Final    Comment: Methicillin (oxacillin)-resistant Staphylococcus aureus (MRSA). MRSA is predictably resistant to beta-lactam antibiotics (except ceftaroline). Preferred therapy is vancomycin unless clinically contraindicated. Patient requires contact precautions if  hospitalized. CRITICAL RESULT CALLED TO, READ BACK BY AND VERIFIED WITH: PHARMD EMILY S. [id number] FCP    Staphylococcus epidermidis NOT DETECTED NOT DETECTED Final   Staphylococcus lugdunensis NOT DETECTED NOT DETECTED Final   Streptococcus species DETECTED (A) NOT DETECTED Final    Comment: Not Enterococcus species, Streptococcus agalactiae, Streptococcus pyogenes, or Streptococcus pneumoniae. CRITICAL RESULT CALLED TO, READ BACK BY AND VERIFIED WITH: PHARMD EMILY S. 1143 [id number] FCP    Streptococcus agalactiae NOT DETECTED NOT DETECTED Final   Streptococcus pneumoniae NOT DETECTED NOT DETECTED Final   Streptococcus pyogenes NOT DETECTED NOT DETECTED Final   A.calcoaceticus-baumannii NOT DETECTED NOT DETECTED Final   Bacteroides fragilis NOT DETECTED NOT DETECTED  Final   Enterobacterales NOT DETECTED NOT DETECTED Final   Enterobacter cloacae complex NOT DETECTED NOT DETECTED Final   Escherichia coli NOT DETECTED NOT DETECTED Final   Klebsiella aerogenes NOT DETECTED NOT DETECTED Final   Klebsiella oxytoca NOT DETECTED NOT DETECTED Final   Klebsiella pneumoniae NOT DETECTED NOT DETECTED Final   Proteus species NOT DETECTED NOT DETECTED Final   Salmonella species NOT DETECTED NOT DETECTED Final   Serratia marcescens NOT DETECTED NOT DETECTED Final   Haemophilus influenzae NOT DETECTED NOT DETECTED Final   Neisseria meningitidis NOT DETECTED NOT DETECTED Final   Pseudomonas aeruginosa NOT DETECTED NOT DETECTED Final   Stenotrophomonas maltophilia NOT DETECTED NOT DETECTED Final   Candida albicans NOT DETECTED NOT DETECTED Final   Candida auris NOT DETECTED NOT DETECTED Final   Candida glabrata NOT DETECTED NOT DETECTED Final   Candida krusei NOT DETECTED NOT DETECTED Final   Candida parapsilosis NOT DETECTED NOT DETECTED Final   Candida tropicalis NOT DETECTED NOT DETECTED Final   Cryptococcus neoformans/gattii NOT DETECTED NOT DETECTED Final   Meth resistant mecA/C and MREJ DETECTED (A) NOT DETECTED Final    Comment: CRITICAL RESULT CALLED TO, READ BACK BY AND VERIFIED WITH: PHARMD EMILY S. [phone] FCP Performed at [facility], [address], [place], [place] [address]   Urine Culture     Status: Abnormal   Collection Time: 04/09/23 12:16 AM   Specimen: Urine, Random  Result Value Ref Range Status   Specimen Description URINE, RANDOM  Final   Special Requests   Final    NONE Reflexed from [id number] Performed at [facility], [address], [place], [place] [address]    Culture (A)  Final    >=100,000 COLONIES/mL PROTEUS MIRABILIS 30,000 COLONIES/mL ESCHERICHIA COLI    Report Status 04/13/2023 FINAL  Final   Organism ID, Bacteria ESCHERICHIA COLI (A)  Final   Organism ID, Bacteria PROTEUS MIRABILIS (A)  Final  Susceptibility   Escherichia coli - MIC*    AMPICILLIN >=32 RESISTANT Resistant     CEFAZOLIN <=4 SENSITIVE Sensitive     CEFEPIME <=0.12 SENSITIVE Sensitive     CEFTRIAXONE <=0.25 SENSITIVE Sensitive     CIPROFLOXACIN >=4 RESISTANT Resistant     GENTAMICIN <=1 SENSITIVE Sensitive     IMIPENEM <=0.25 SENSITIVE Sensitive     NITROFURANTOIN <=16 SENSITIVE Sensitive     TRIMETH/SULFA >=320 RESISTANT Resistant     AMPICILLIN/SULBACTAM 4 SENSITIVE Sensitive     PIP/TAZO <=4 SENSITIVE Sensitive     * 30,000 COLONIES/mL ESCHERICHIA COLI   Proteus mirabilis - MIC*    AMPICILLIN 16 INTERMEDIATE Intermediate     CEFAZOLIN <=4 SENSITIVE Sensitive     CEFEPIME <=0.12 SENSITIVE Sensitive     CEFTRIAXONE <=0.25 SENSITIVE Sensitive     CIPROFLOXACIN <=0.25 SENSITIVE Sensitive     GENTAMICIN <=1 SENSITIVE Sensitive     IMIPENEM 1 SENSITIVE Sensitive     NITROFURANTOIN 128 RESISTANT Resistant     TRIMETH/SULFA <=20 SENSITIVE Sensitive     AMPICILLIN/SULBACTAM 8 SENSITIVE Sensitive     * >=100,000 COLONIES/mL PROTEUS MIRABILIS  Culture, blood (Routine X 2) w Reflex to ID Panel     Status: None   Collection Time: 04/10/23  6:50 AM   Specimen: BLOOD LEFT HAND  Result Value Ref Range Status   Specimen Description BLOOD LEFT HAND  Final   Special Requests   Final    BOTTLES DRAWN AEROBIC AND ANAEROBIC Blood Culture adequate volume   Culture   Final    NO GROWTH 5 DAYS Performed at [facility], [address], [place], [place] [address]    Report Status 04/15/2023 FINAL  Final  Culture, blood (Routine X 2) w Reflex to ID Panel     Status: None   Collection Time: 04/10/23  6:50 AM   Specimen: BLOOD LEFT HAND  Result Value Ref Range Status   Specimen Description BLOOD LEFT HAND  Final   Special Requests   Final    BOTTLES DRAWN AEROBIC AND ANAEROBIC Blood Culture adequate volume   Culture   Final    NO GROWTH 5 DAYS Performed at [facility], [address], [place], [place]  [address]    Report Status 04/15/2023 FINAL  Final         Radiology Studies: No results found.   Scheduled Meds:  ARIPiprazole  15 mg Oral Daily   ascorbic acid  500 mg Oral Daily   Chlorhexidine Gluconate Cloth  6 each Topical Daily   cholecalciferol  1,000 Units Oral Daily   cyanocobalamin  1,000 mcg Subcutaneous Daily   Followed by   Melene Muller ON 04/23/2023] cyanocobalamin  1,000 mcg Subcutaneous Q30 days   enoxaparin (LOVENOX) injection  40 mg Subcutaneous Q24H   feeding supplement  237 mL Oral TID BM   leptospermum manuka honey  1 Application Topical Daily   lidocaine (PF)  10 mL Infiltration Once   midodrine  10 mg Oral Q8H   multivitamin with minerals  1 tablet Oral Daily   nutrition supplement (JUVEN)  1 packet Oral BID BM   nystatin   Topical TID   pantoprazole  40 mg Oral Daily   vitamin A  10,000 Units Oral Daily   vitamin E  400 Units Oral Daily   zinc sulfate  220 mg Oral Daily   Continuous Infusions:  cefTRIAXone (ROCEPHIN)  IV Stopped (04/18/23 1200)   DAPTOmycin (CUBICIN) 600  mg in sodium chloride 0.9 % IVPB Stopped (04/17/23 1446)     LOS: 9 days   Jeoffrey Massed, MD [facility]  If 7PM-7AM, please contact night-coverage [email or web address]  04/18/2023, 1:09 PM

## 2023-04-18 NOTE — TOC Progression Note (Addendum)
Transition of Care [facility]) - Progression Note    Patient Details  Name: Steve [name] MRN: [id number] Date of Birth: 08-11-1991  [facility] CM/SW Contact  Mearl Latin, LCSW Phone Number: 04/18/2023, 12:22 PM  Clinical Narrative:    12:22 PM-CSW left voicemail for Elmer Bales [facility] Admissions Coordinator for [facility]: p. [phone]. [phone] /Fax: [phone]) to follow up on faxed referral for [facility] house [facility] program.  CSW left message for Wonda Cerise with [facility] SNFs to see if she had an update on [place] SNF referral (P: [phone] ext. [phone]/ F: [phone]).  2pm-CSW received denial for [facility] contracted SNF placement. CSW still awaiting response on in house [place] program.      Barriers to Discharge: Continued Medical Work up, English as a second language teacher, SNF Pending bed offer (Substance use history w/ need for PICC)  Expected Discharge Plan and Services In-house Referral: Clinical Social Work   Post Acute Care Choice: Skilled Nursing Facility Living arrangements for the past 2 months: Single Family Home                                       Social Determinants of Health (SDOH) Interventions SDOH Screenings   Food Insecurity: No Food Insecurity (06/25/2022)  Housing: Low Risk  (06/25/2022)  Transportation Needs: No Transportation Needs (06/25/2022)  Utilities: Not At Risk (06/25/2022)  Tobacco Use: High Risk (04/08/2023)    Readmission Risk Interventions    07/01/2022   10:10 AM 06/29/2022    9:19 AM  Readmission Risk Prevention Plan  Transportation Screening Complete Complete  Medication Review Oceanographer) Complete Complete  PCP or Specialist appointment within 3-5 days of discharge Complete Complete  HRI or Home Care Consult Complete Complete  SW Recovery Care/Counseling Consult Complete Complete  Palliative Care Screening Not Applicable Not Applicable  Skilled Nursing Facility Not Applicable Not Applicable

## 2023-04-18 NOTE — Progress Notes (Signed)
   04/18/23 1000  Spiritual Encounters  Type of Visit Initial  Care provided to: Patient  Conversation partners present during encounter Nurse  Referral source Patient request  Reason for visit Routine spiritual support  OnCall Visit No  Spiritual Framework  Presenting Themes Caregiving needs;Meaning/purpose/sources of inspiration  Values/beliefs life  Needs/Challenges/Barriers lack of support  Patient Stress Factors Health changes;Lack of caregivers  Interventions  Spiritual Care Interventions Made Compassionate presence;Reflective listening;Established relationship of care and support;Normalization of emotions;Meaning making  Intervention Outcomes  Outcomes Awareness of health;Awareness of support;Reduced anxiety  Spiritual Care Plan  Spiritual Care Issues Still Outstanding No further spiritual care needs at this time (see row info)   Ch responded to request for spiritual and emotional support. There was no family at bedside. Pt is concern about his health. He is worrying that he might not get the adequate support or he feels like he might be a burden to the care team because his situation demands a lot of support. Ch reassured pt that he is not a burden to the care team and encouraged him to communicate his needs. Ch provided compassionate presence and asked guided questions about value. Ch remains available.

## 2023-04-19 ENCOUNTER — Inpatient Hospital Stay (HOSPITAL_COMMUNITY): Payer: No Typology Code available for payment source

## 2023-04-19 DIAGNOSIS — A419 Sepsis, unspecified organism: Secondary | ICD-10-CM | POA: Diagnosis not present

## 2023-04-19 DIAGNOSIS — M7989 Other specified soft tissue disorders: Secondary | ICD-10-CM

## 2023-04-19 DIAGNOSIS — R6521 Severe sepsis with septic shock: Secondary | ICD-10-CM | POA: Diagnosis not present

## 2023-04-19 DIAGNOSIS — F1510 Other stimulant abuse, uncomplicated: Secondary | ICD-10-CM | POA: Diagnosis not present

## 2023-04-19 DIAGNOSIS — M0009 Staphylococcal polyarthritis: Secondary | ICD-10-CM | POA: Diagnosis not present

## 2023-04-19 NOTE — Progress Notes (Signed)
Bilateral lower extremity venous duplex has been completed. Preliminary results can be found in CV Proc through chart review.   04/19/23 2:14 PM Olen Cordial RVT

## 2023-04-19 NOTE — Progress Notes (Signed)
PROGRESS NOTE    Steve [name]  GEX:[id number] DOB: 01-03-1991 DOA: 04/08/2023 PCP: [facility]   Brief Narrative:  32 year old male with with history of paraplegia-known sacral decubitus ulcers with chronic osteomyelitis, substance abuse, chronic hypotension on midodrine-presented with septic shock in the setting of polymicrobial bacteremia and acute on chronic osteomyelitis of sacral decubitus ulcers and left hip septic arthritis.  Initially admitted to the ICU-stabilized and subsequently transferred to [facility].  Evaluated by orthopedics-no surgical interventions recommended.  Evaluated by ID-with plans for 6 weeks of IV daptomycin/Rocephin.  Social worker following for placement-as patient is not a candidate for outpatient IV antibiotics (history of drug use).   Assessment & Plan: Septic shock-POA Acute on chronic osteomyelitis septic arthritis of the left hip  Polymicrobial bacteremia with with MRSA, Group C Streptococcus and Proteus mirabilis  Proteus mirabilis UTI Overall better-but starting to have intermittent fever for the past several days Sacral wounds appear unchanged No diarrhea or abdominal pain. Repeat blood culture on 8/1 negative so far Awaiting bilateral lower extremity Doppler-to rule out DVT as a cause of fever Evaluated by Dr. Lajoyce Corners during the earlier part of this hospitalization-no surgical intervention recommended Patient complaining of some pain-popping sound in the left hip area-will recheck CT pelvis today. Had discussed with infectious MD-Dr Elinor Parkinson on 8/1-recommendations were to repeat cultures-get Doppler-continue with current antibiotics. Remains on daptomycin/Rocephin-per prior notes-no need to pursue TEE-as patient will be on IV antibiotics for prolonged course.  End date of treatment 9/4.   Acute on chronic normocytic anemia  Secondary to acute illness-superimposed on anemia of chronic disease Continue to follow CBC-last transfusion on  7/29.  Hypotension BP soft but stable on midodrine.  Hyponatremia Continues to mild hyponatremia-likely from free water intake-continue fluid restriction 1.2 L, continue salt tablets Since BP soft-holding off on using diuretics/Samsca.   Thankfully patient asymptomatic-will continue to follow with salt tablets/fluid restrictions.   GERD PPI  Vitamin D deficiency continue with supplements  Vitamin B12 deficiency Continue supplementation  Vitamin A, C, D deficiency, zinc deficiency Continue with supplements   Neurogenic bladder/UTI Paraplegia Continue Foley, urine culture growing Proteus, continue with Rocephin   Schizophrenia/PTSD Seems stable Continue Abilify  Substance abuse  Self reports use of methamphetamines   Pressure ulcers -Continue with wound care, started on vitamin C and zinc Pressure Injury 05/30/21 Ankle Left;Lateral Unstageable - Full thickness tissue loss in which the base of the injury is covered by slough (yellow, tan, gray, green or brown) and/or eschar (tan, brown or black) in the wound bed. (Active)  05/30/21 1938  Location: Ankle  Location Orientation: Left;Lateral  Staging: Unstageable - Full thickness tissue loss in which the base of the injury is covered by slough (yellow, tan, gray, green or brown) and/or eschar (tan, brown or black) in the wound bed.  Wound Description (Comments):   Present on Admission: Yes     Pressure Injury 08/29/21 Ischial tuberosity Left Stage 4 - Full thickness tissue loss with exposed bone, tendon or muscle. [id number] (Active)  08/29/21 0100  Location: Ischial tuberosity  Location Orientation: Left  Staging: Stage 4 - Full thickness tissue loss with exposed bone, tendon or muscle.  Wound Description (Comments): [id number]  Present on Admission: Yes     Pressure Injury 04/09/23 Pretibial Right;Lateral Stage 2 -  Partial thickness loss of dermis presenting as a shallow open injury with a red, pink wound bed without slough.  full thickness, NOT a pressure injury (Active)  04/09/23 0351  Location: Pretibial  Location Orientation: Right;Lateral  Staging: Stage 2 -  Partial thickness loss of dermis presenting as a shallow open injury with a red, pink wound bed without slough.  Wound Description (Comments): full thickness, NOT a pressure injury  Present on Admission: Yes     Pressure Injury 04/09/23 Ischial tuberosity Right Stage 4 - Full thickness tissue loss with exposed bone, tendon or muscle. (Active)  04/09/23 0100  Location: Ischial tuberosity  Location Orientation: Right  Staging: Stage 4 - Full thickness tissue loss with exposed bone, tendon or muscle.  Wound Description (Comments):   Present on Admission: Yes     Pressure Injury 04/09/23 Elbow Left;Posterior Stage 2 -  Partial thickness loss of dermis presenting as a shallow open injury with a red, pink wound bed without slough. (Active)  04/09/23 0330  Location: Elbow  Location Orientation: Left;Posterior  Staging: Stage 2 -  Partial thickness loss of dermis presenting as a shallow open injury with a red, pink wound bed without slough.  Wound Description (Comments):   Present on Admission: Yes    DVT prophylaxis: enoxaparin (LOVENOX) injection 40 mg Start: 04/16/23 2200 SCDs Start: 04/09/23 0140   Code Status:   Code Status: Full Code  Family Communication: None at bedside  Status is: Inpatient  Dispo: The patient is from: Home              Anticipated d/c is to: SNF              Anticipated d/c date is:               Patient currentlymedically stable for discharge  Consultants:  PCCM, ID, orthopedic (duda)  Procedures:  7/29>> PICC line   Subjective: Claims that he had a popping sound in his left hip area-feels cold.  No other issues overnight.  Objective: Vitals:   04/19/23 0000 04/19/23 0312 04/19/23 0840 04/19/23 0944  BP: (!) 144/78 (!) 108/56 (!) 115/53 113/62  Pulse: 86 99 (!) 117 (!) 105  Resp: 16 18 20 19   Temp: 99.1  F (37.3 C) 99 F (37.2 C) (!) 102.5 F (39.2 C) 99.6 F (37.6 C)  TempSrc: Oral Oral Oral Axillary  SpO2: 97% 95% 96%   Weight:      Height:        Intake/Output Summary (Last 24 hours) at 04/19/2023 1211 Last data filed at 04/19/2023 6578 Gross per 24 hour  Intake 862 ml  Output 2900 ml  Net -2038 ml   Filed Weights   04/14/23 0500 04/17/23 0700 04/18/23 0500  Weight: 77 kg 76.8 kg 82.4 kg    Examination: Gen Exam:Alert awake-not in any distress HEENT:atraumatic, normocephalic Chest: B/L clear to auscultation anteriorly CVS:S1S2 regular Abdomen:soft non tender, non distended Extremities:no edema Neurology: Paraplegic Skin: no rash  Data Reviewed: I have personally reviewed following labs and imaging studies  CBC: Recent Labs  Lab 04/14/23 0225 04/15/23 0637 04/16/23 2345 04/18/23 0810 04/19/23 0557  WBC 4.9 6.7 7.5 9.0 6.7  NEUTROABS  --   --   --   --  5.3  HGB 6.1* 8.5* 7.7* 8.2* 7.9*  HCT 19.4* 26.4* 24.0* 25.8* 25.6*  MCV 86.2 86.6 88.9 87.2 88.9  PLT 195 306 259 269 PLATELET CLUMPS NOTED ON SMEAR, UNABLE TO ESTIMATE   Basic Metabolic Panel: Recent Labs  Lab 04/13/23 0435 04/14/23 0225 04/15/23 0637 04/16/23 0345 04/16/23 2345 04/18/23 0810 04/19/23 0557  NA 125* 125* 127* 127* 127* 126* 127*  K 4.2 4.1 4.1  3.6 4.1 4.2 4.0  CL 92* 93* 97* 96* 94* 94* 95*  CO2 25 23 24 22 24 24  20*  GLUCOSE 92 130* 97 95 91 76 87  BUN 16 13 9 11 9 7 7   CREATININE 0.58* 0.71 0.52* 0.63 0.57* 0.49* 0.49*  CALCIUM 7.4* 7.1* 7.6* 7.5* 7.5* 7.5* 7.4*  PHOS 4.4 2.5  --   --   --   --   --    GFR: Estimated Creatinine Clearance: 139.9 mL/min (A) (by C-G formula based on SCr of 0.49 mg/dL (L)). Liver Function Tests: Recent Labs  Lab 04/19/23 0557  AST 22  ALT 17  ALKPHOS 127*  BILITOT 0.2*  PROT 6.6  ALBUMIN <1.5*    No results for input(s): "LIPASE", "AMYLASE" in the last 168 hours.  No results for input(s): "AMMONIA" in the last 168 hours. Coagulation  Profile: No results for input(s): "INR", "PROTIME" in the last 168 hours.  Cardiac Enzymes: Recent Labs  Lab 04/19/23 0557  CKTOTAL 31*   BNP (last 3 results) No results for input(s): "PROBNP" in the last 8760 hours. HbA1C: No results for input(s): "HGBA1C" in the last 72 hours. CBG: No results for input(s): "GLUCAP" in the last 168 hours.  Lipid Profile: No results for input(s): "CHOL", "HDL", "LDLCALC", "TRIG", "CHOLHDL", "LDLDIRECT" in the last 72 hours. Thyroid Function Tests: Recent Labs    04/18/23 1343  TSH 1.914   Anemia Panel: No results for input(s): "VITAMINB12", "FOLATE", "FERRITIN", "TIBC", "IRON", "RETICCTPCT" in the last 72 hours.   Sepsis Labs: Recent Labs  Lab 04/18/23 1343  PROCALCITON 11.94     Recent Results (from the past 240 hour(s))  Culture, blood (Routine X 2) w Reflex to ID Panel     Status: None   Collection Time: 04/10/23  6:50 AM   Specimen: BLOOD LEFT HAND  Result Value Ref Range Status   Specimen Description BLOOD LEFT HAND  Final   Special Requests   Final    BOTTLES DRAWN AEROBIC AND ANAEROBIC Blood Culture adequate volume   Culture   Final    NO GROWTH 5 DAYS Performed at [facility], [address] [place], [place] [address]    Report Status 04/15/2023 FINAL  Final  Culture, blood (Routine X 2) w Reflex to ID Panel     Status: None   Collection Time: 04/10/23  6:50 AM   Specimen: BLOOD LEFT HAND  Result Value Ref Range Status   Specimen Description BLOOD LEFT HAND  Final   Special Requests   Final    BOTTLES DRAWN AEROBIC AND ANAEROBIC Blood Culture adequate volume   Culture   Final    NO GROWTH 5 DAYS Performed at [facility], [address] [place], [place] [address]    Report Status 04/15/2023 FINAL  Final  Culture, blood (Routine X 2) w Reflex to ID Panel     Status: None (Preliminary result)   Collection Time: 04/18/23  8:06 AM   Specimen: BLOOD RIGHT ARM  Result Value Ref Range Status   Specimen  Description BLOOD RIGHT ARM  Final   Special Requests   Final    BOTTLES DRAWN AEROBIC AND ANAEROBIC Blood Culture adequate volume   Culture   Final    NO GROWTH < 24 HOURS Performed at [facility], [address], [place], [place] [address]    Report Status PENDING  Incomplete  Culture, blood (Routine X 2) w Reflex to ID Panel  Status: None (Preliminary result)   Collection Time: 04/18/23  8:10 AM   Specimen: BLOOD LEFT HAND  Result Value Ref Range Status   Specimen Description BLOOD LEFT HAND  Final   Special Requests   Final    BOTTLES DRAWN AEROBIC ONLY Blood Culture adequate volume   Culture   Final    NO GROWTH < 24 HOURS Performed at [facility], [address] [place], [place] [address]    Report Status PENDING  Incomplete         Radiology Studies: DG Pelvis Portable  Result Date: 04/18/2023 CLINICAL DATA:  History of paraplegia. Sacral decubitus ulcers. Chronic osteomyelitis. Septic shock. EXAM: PORTABLE PELVIS 1-2 VIEWS COMPARISON:  AP pelvis 02/27/2014 and CT abdomen and pelvis 04/08/2023 FINDINGS: There is lucency from a large the fetus ulcer within the left buttock posterior to the left ischium. There is high-grade osseous destruction again seen within the left ischium and posterior left inferior pubic ramus. There is again superolateral dislocation of left femoral head with respect to the left acetabulum, seen to be posterior on recent CT. There is cortical step-off within the medial left acetabulum related to the subacute fracture seen on prior CT. There is callus formation and cortical thickening about the fracture left femur. The bilateral sacroiliac joint spaces are maintained. Mild narrowing of the right femoroacetabular joint with mild right femoral head-neck junction degenerative osteophytes again seen. IMPRESSION: 1. Redemonstration of large decubitus ulcer within the left buttock posterior to the left ischium. 2. High-grade osseous destruction  again seen within the left ischium and posterior left inferior pubic ramus consistent with osteomyelitis. 3. Superolateral dislocation of the left femoral head with respect to the left acetabulum, seen to be posterior on recent CT. 4. Subacute fracture of the medial left acetabulum. Electronically Signed   By: Neita Garnet M.D.   On: 04/18/2023 16:19     Scheduled Meds:  ARIPiprazole  15 mg Oral Daily   ascorbic acid  500 mg Oral Daily   Chlorhexidine Gluconate Cloth  6 each Topical Daily   cholecalciferol  1,000 Units Oral Daily   cyanocobalamin  1,000 mcg Subcutaneous Daily   Followed by   Melene Muller ON 04/23/2023] cyanocobalamin  1,000 mcg Subcutaneous Q30 days   enoxaparin (LOVENOX) injection  40 mg Subcutaneous Q24H   feeding supplement  237 mL Oral TID BM   leptospermum manuka honey  1 Application Topical Daily   lidocaine (PF)  10 mL Infiltration Once   midodrine  10 mg Oral Q8H   multivitamin with minerals  1 tablet Oral Daily   nutrition supplement (JUVEN)  1 packet Oral BID BM   nystatin   Topical TID   pantoprazole  40 mg Oral Daily   sodium chloride  1 g Oral BID WC   vitamin A  10,000 Units Oral Daily   vitamin E  400 Units Oral Daily   zinc sulfate  220 mg Oral Daily   Continuous Infusions:  cefTRIAXone (ROCEPHIN)  IV 2 g (04/19/23 1020)   DAPTOmycin (CUBICIN) 600 mg in sodium chloride 0.9 % IVPB Stopped (04/18/23 1535)     LOS: 10 days   Jeoffrey Massed, MD [facility]  If 7PM-7AM, please contact night-coverage [email or web address]  04/19/2023, 12:11 PM

## 2023-04-19 NOTE — Progress Notes (Signed)
OT Cancellation Note  Patient Details Name: Steve [name] MRN: [id number] DOB: [birth year]   Cancelled Treatment:    Reason Eval/Treat Not Completed: Patient declined, no reason specified (Patient stated he was tired from lack of sleep and asked COTA to return later today. Will attempt later today as schedule permits.) Alfonse Flavors, OTA Acute Rehabilitation Services  Office [phone]  Dewain Penning 04/19/2023, 10:35 AM

## 2023-04-19 NOTE — Plan of Care (Signed)

## 2023-04-19 NOTE — Progress Notes (Signed)
PT Cancellation Note  Patient Details Name: Steve [name] MRN: [id number] DOB: 1991/01/10   Cancelled Treatment:    Reason Eval/Treat Not Completed: Fatigue/lethargy limiting ability to participate (pt very lethargic, states he needs to sleep and cannot participate today. Pt in room w/no clothes on, sheets off and fan on with A/C down low. Nursing was notified. Will continue to follow up as able and appropriate.)   [name], DPT, CLT  Acute Rehabilitation Services Office: [phone] (Secure chat preferred)  [name] 04/19/2023, 4:18 PM

## 2023-04-19 NOTE — Progress Notes (Signed)
This RN opened patient's chart  because Pooja RN assigned to the room needed a second eye in  looking at dressing changes order.

## 2023-04-20 ENCOUNTER — Inpatient Hospital Stay (HOSPITAL_COMMUNITY): Payer: No Typology Code available for payment source

## 2023-04-20 DIAGNOSIS — F1510 Other stimulant abuse, uncomplicated: Secondary | ICD-10-CM | POA: Diagnosis not present

## 2023-04-20 DIAGNOSIS — A419 Sepsis, unspecified organism: Secondary | ICD-10-CM | POA: Diagnosis not present

## 2023-04-20 DIAGNOSIS — M0009 Staphylococcal polyarthritis: Secondary | ICD-10-CM | POA: Diagnosis not present

## 2023-04-20 DIAGNOSIS — R6521 Severe sepsis with septic shock: Secondary | ICD-10-CM | POA: Diagnosis not present

## 2023-04-20 MED ORDER — GADOBUTROL 1 MMOL/ML IV SOLN
9.0000 mL | Freq: Once | INTRAVENOUS | Status: AC | PRN
  Administered 2023-04-20: 9 mL via INTRAVENOUS

## 2023-04-20 MED ORDER — LORAZEPAM 2 MG/ML IJ SOLN
1.0000 mg | Freq: Once | INTRAMUSCULAR | Status: AC | PRN
  Administered 2023-04-20: 1 mg via INTRAVENOUS
  Filled 2023-04-20: qty 1

## 2023-04-20 NOTE — Progress Notes (Addendum)
OT Cancellation Note  Patient Details Name: Steve [name] MRN: [id number] DOB: 1990/12/31   Cancelled Treatment:    Reason Eval/Treat Not Completed: pt. In bed with blankets covering him completely including head.  pt. Declines skilled therapy session from under that blankets stating he is tired. RN aware of attempt and refusal.   Will attempt back as able.   Alessandra Bevels Lorraine-COTA/L 04/20/2023, 12:35 PM

## 2023-04-20 NOTE — Plan of Care (Signed)
  Problem: Education: °Goal: Knowledge of General Education information will improve °Description: Including pain rating scale, medication(s)/side effects and non-pharmacologic comfort measures °Outcome: Progressing °  °Problem: Nutrition: °Goal: Adequate nutrition will be maintained °Outcome: Progressing °  °Problem: Elimination: °Goal: Will not experience complications related to bowel motility °Outcome: Progressing °  °Problem: Pain Managment: °Goal: General experience of comfort will improve °Outcome: Progressing °  °Problem: Safety: °Goal: Ability to remain free from injury will improve °Outcome: Progressing °  °Problem: Skin Integrity: °Goal: Risk for impaired skin integrity will decrease °Outcome: Progressing °  °

## 2023-04-20 NOTE — Progress Notes (Signed)
PROGRESS NOTE    Steve MCCAULEY  UJW:[id number] DOB: Feb 05, 1991 DOA: 04/08/2023 PCP: [facility]   Brief Narrative:  32 year old male with with history of paraplegia-known sacral decubitus ulcers with chronic osteomyelitis, substance abuse, chronic hypotension on midodrine-presented with septic shock in the setting of polymicrobial bacteremia and acute on chronic osteomyelitis of sacral decubitus ulcers and left hip septic arthritis.  Initially admitted to the ICU-stabilized and subsequently transferred to [facility].  Evaluated by orthopedics-no surgical interventions recommended.  Evaluated by ID-with plans for 6 weeks of IV daptomycin/Rocephin.  Social worker following for placement-as patient is not a candidate for outpatient IV antibiotics (history of drug use).   Assessment & Plan: Septic shock-POA Acute on chronic osteomyelitis septic arthritis of the left hip  Polymicrobial bacteremia with with MRSA, Group C Streptococcus and Proteus mirabilis  Proteus mirabilis UTI Overall better-but continues to have intermittent fever Repeat blood cultures negative No diarrhea Dopplers negative for DVT Repeat CT pelvis on 8/2 noted-will discuss with radiology whether we need to repeat CT with contrast to do an MRI instead to rule out a abscess.  Evaluated by Dr. Lajoyce Corners during the earlier part of this hospitalization-no surgical intervention recommended Remains on daptomycin/Rocephin-per prior notes-no need to pursue TEE-as patient will be on IV antibiotics for prolonged course.  End date of treatment 9/4.   Acute on chronic normocytic anemia  Secondary to acute illness-superimposed on anemia of chronic disease Continue to follow CBC-last transfusion on 7/29.  Hypotension BP soft but stable on midodrine.  Hyponatremia Continues to mild hyponatremia-likely from free water intake-continue fluid restriction 1.2 L, continue salt tablets Since BP soft-holding off on using diuretics/Samsca.    Thankfully patient asymptomatic-will continue to follow with salt tablets/fluid restrictions. Labs not drawn today-will discuss with nursing staff and see if this can be done.   GERD PPI  Vitamin D deficiency continue with supplements  Vitamin B12 deficiency Continue supplementation  Vitamin A, C, D deficiency, zinc deficiency Continue with supplements   Neurogenic bladder/UTI Paraplegia Continue Foley, urine culture growing Proteus, continue with Rocephin   Schizophrenia/PTSD Seems stable Continue Abilify  Substance abuse  Self reports use of methamphetamines   Pressure ulcers -Continue with wound care, started on vitamin C and zinc Pressure Injury 05/30/21 Ankle Left;Lateral Unstageable - Full thickness tissue loss in which the base of the injury is covered by slough (yellow, tan, gray, green or brown) and/or eschar (tan, brown or black) in the wound bed. (Active)  05/30/21 1938  Location: Ankle  Location Orientation: Left;Lateral  Staging: Unstageable - Full thickness tissue loss in which the base of the injury is covered by slough (yellow, tan, gray, green or brown) and/or eschar (tan, brown or black) in the wound bed.  Wound Description (Comments):   Present on Admission: Yes     Pressure Injury 08/29/21 Ischial tuberosity Left Stage 4 - Full thickness tissue loss with exposed bone, tendon or muscle. [id number] (Active)  08/29/21 0100  Location: Ischial tuberosity  Location Orientation: Left  Staging: Stage 4 - Full thickness tissue loss with exposed bone, tendon or muscle.  Wound Description (Comments): [id number]  Present on Admission: Yes     Pressure Injury 04/09/23 Pretibial Right;Lateral Stage 2 -  Partial thickness loss of dermis presenting as a shallow open injury with a red, pink wound bed without slough. full thickness, NOT a pressure injury (Active)  04/09/23 0351  Location: Pretibial  Location Orientation: Right;Lateral  Staging: Stage 2 -  Partial thickness  loss  of dermis presenting as a shallow open injury with a red, pink wound bed without slough.  Wound Description (Comments): full thickness, NOT a pressure injury  Present on Admission: Yes     Pressure Injury 04/09/23 Ischial tuberosity Right Stage 4 - Full thickness tissue loss with exposed bone, tendon or muscle. (Active)  04/09/23 0100  Location: Ischial tuberosity  Location Orientation: Right  Staging: Stage 4 - Full thickness tissue loss with exposed bone, tendon or muscle.  Wound Description (Comments):   Present on Admission: Yes     Pressure Injury 04/09/23 Elbow Left;Posterior Stage 2 -  Partial thickness loss of dermis presenting as a shallow open injury with a red, pink wound bed without slough. (Active)  04/09/23 0330  Location: Elbow  Location Orientation: Left;Posterior  Staging: Stage 2 -  Partial thickness loss of dermis presenting as a shallow open injury with a red, pink wound bed without slough.  Wound Description (Comments):   Present on Admission: Yes    DVT prophylaxis: enoxaparin (LOVENOX) injection 40 mg Start: 04/16/23 2200 SCDs Start: 04/09/23 0140   Code Status:   Code Status: Full Code  Family Communication: None at bedside  Status is: Inpatient  Dispo: The patient is from: Home              Anticipated d/c is to: SNF              Anticipated d/c date is:               Patient currentlymedically stable for discharge  Consultants:  PCCM, ID, orthopedic (duda)  Procedures:  7/29>> PICC line   Subjective: Had contemplated leaving AMA but is now willing to stay.  No other issues overnight.  Still febrile.  Objective: Vitals:   04/20/23 0207 04/20/23 0400 04/20/23 0500 04/20/23 0800  BP: 105/65 104/66  112/70  Pulse: 100 95  100  Resp: (!) 24 (!) 22  14  Temp: 98.3 F (36.8 C) 98.6 F (37 C)  99 F (37.2 C)  TempSrc: Oral Oral  Oral  SpO2: 94% 93%  96%  Weight:   82 kg   Height:        Intake/Output Summary (Last 24 hours) at  04/20/2023 1135 Last data filed at 04/20/2023 8119 Gross per 24 hour  Intake --  Output 1000 ml  Net -1000 ml   Filed Weights   04/17/23 0700 04/18/23 0500 04/20/23 0500  Weight: 76.8 kg 82.4 kg 82 kg    Examination: Gen Exam:Alert awake-not in any distress HEENT:atraumatic, normocephalic Chest: B/L clear to auscultation anteriorly CVS:S1S2 regular Abdomen:soft non tender, non distended Extremities:no edema Neurology: Paraplegic. Skin: no rash  Data Reviewed: I have personally reviewed following labs and imaging studies  CBC: Recent Labs  Lab 04/14/23 0225 04/15/23 0637 04/16/23 2345 04/18/23 0810 04/19/23 0557  WBC 4.9 6.7 7.5 9.0 6.7  NEUTROABS  --   --   --   --  5.3  HGB 6.1* 8.5* 7.7* 8.2* 7.9*  HCT 19.4* 26.4* 24.0* 25.8* 25.6*  MCV 86.2 86.6 88.9 87.2 88.9  PLT 195 306 259 269 PLATELET CLUMPS NOTED ON SMEAR, UNABLE TO ESTIMATE   Basic Metabolic Panel: Recent Labs  Lab 04/14/23 0225 04/15/23 0637 04/16/23 0345 04/16/23 2345 04/18/23 0810 04/19/23 0557  NA 125* 127* 127* 127* 126* 127*  K 4.1 4.1 3.6 4.1 4.2 4.0  CL 93* 97* 96* 94* 94* 95*  CO2 23 24 22 24 24  20*  GLUCOSE 130*  97 95 91 76 87  BUN 13 9 11 9 7 7   CREATININE 0.71 0.52* 0.63 0.57* 0.49* 0.49*  CALCIUM 7.1* 7.6* 7.5* 7.5* 7.5* 7.4*  PHOS 2.5  --   --   --   --   --    GFR: Estimated Creatinine Clearance: 139.9 mL/min (A) (by C-G formula based on SCr of 0.49 mg/dL (L)). Liver Function Tests: Recent Labs  Lab 04/19/23 0557  AST 22  ALT 17  ALKPHOS 127*  BILITOT 0.2*  PROT 6.6  ALBUMIN <1.5*    No results for input(s): "LIPASE", "AMYLASE" in the last 168 hours.  No results for input(s): "AMMONIA" in the last 168 hours. Coagulation Profile: No results for input(s): "INR", "PROTIME" in the last 168 hours.  Cardiac Enzymes: Recent Labs  Lab 04/19/23 0557  CKTOTAL 31*   BNP (last 3 results) No results for input(s): "PROBNP" in the last 8760 hours. HbA1C: No results for  input(s): "HGBA1C" in the last 72 hours. CBG: No results for input(s): "GLUCAP" in the last 168 hours.  Lipid Profile: No results for input(s): "CHOL", "HDL", "LDLCALC", "TRIG", "CHOLHDL", "LDLDIRECT" in the last 72 hours. Thyroid Function Tests: Recent Labs    04/18/23 1343  TSH 1.914   Anemia Panel: No results for input(s): "VITAMINB12", "FOLATE", "FERRITIN", "TIBC", "IRON", "RETICCTPCT" in the last 72 hours.   Sepsis Labs: Recent Labs  Lab 04/18/23 1343  PROCALCITON 11.94     Recent Results (from the past 240 hour(s))  Culture, blood (Routine X 2) w Reflex to ID Panel     Status: None (Preliminary result)   Collection Time: 04/18/23  8:06 AM   Specimen: BLOOD RIGHT ARM  Result Value Ref Range Status   Specimen Description BLOOD RIGHT ARM  Final   Special Requests   Final    BOTTLES DRAWN AEROBIC AND ANAEROBIC Blood Culture adequate volume   Culture  Setup Time   Final    GRAM POSITIVE COCCI IN CHAINS ANAEROBIC BOTTLE ONLY CRITICAL VALUE NOTED.  VALUE IS CONSISTENT WITH PREVIOUSLY REPORTED AND CALLED VALUE. Performed at [facility], [address] [place], [place] [address]    Culture GRAM POSITIVE COCCI  Final   Report Status PENDING  Incomplete  Culture, blood (Routine X 2) w Reflex to ID Panel     Status: None (Preliminary result)   Collection Time: 04/18/23  8:10 AM   Specimen: BLOOD LEFT HAND  Result Value Ref Range Status   Specimen Description BLOOD LEFT HAND  Final   Special Requests   Final    BOTTLES DRAWN AEROBIC ONLY Blood Culture adequate volume   Culture   Final    NO GROWTH 2 DAYS Performed at [facility], [address], [place], [place] [address]    Report Status PENDING  Incomplete         Radiology Studies: CT PELVIS WO CONTRAST  Result Date: 04/19/2023 CLINICAL DATA:  Soft tissue infection suspected, pelvis, xray done reassess the bony lesions in left hip Electronic records indicates sacral wounds, postop. EXAM: CT  PELVIS WITHOUT CONTRAST TECHNIQUE: Multidetector CT imaging of the pelvis was performed following the standard protocol without intravenous contrast. RADIATION DOSE REDUCTION: This exam was performed according to the departmental dose-optimization program which includes automated exposure control, adjustment of the mA and/or kV according to patient size and/or use of iterative reconstruction technique. COMPARISON:  Pelvic radiograph yesterday. Abdominopelvic CT 04/08/2023 FINDINGS: Urinary Tract: The included ureters are decompressed. Foley catheter  decompresses the urinary bladder. No intravesicular air. Bowel: Pelvic bowel loops suboptimally assessed on this unenhanced exam. Mild rectal wall thickening. No evidence of obstruction. Vascular/Lymphatic: Enlarged lymph nodes in the retroperitoneum, left external iliac chain, and both inguinal stations, less well assessed on the current exam in the absence of contrast. Reproductive:  Nonacute. Other:  Decubitus ulcer is described below. Musculoskeletal: The left hip is dislocated as before. Left sacra ulcer again seen extending to bone. Destructive changes involving the femoral head may related to infection or abnormal alignment. Heterotopic ossification about the left proximal femur consistent with chronic osteomyelitis. There is heterogeneous soft tissue density with soft tissue gas adjacent to the left proximal femur extending into the left hip joint. Acetabular irregularity with subacute acetabular fracture in heterogeneous fluid in the left hip joint space. There may be additional heterogeneous fluid surrounding the upper femur, not well assessed on this unenhanced exam. Chronic osteomyelitis of the left ischium and chronic deformity of the left acetabula. Chronic right ischial ulcer extending to bone with chronic osteomyelitis and ischial deformity. Small right hip joint effusion. IMPRESSION: 1. Unchanged left hip dislocation, chronic. Sequela of chronic  osteomyelitis in the left pelvis related to ischial decubitus ulcer. Heterogeneous soft tissue density and gas related to decubitus ulcer, limited assessment for focal fluid collection in the absence of IV contrast. Suspect ill-defined fluid subjacent to the ulcer extending towards the hip joint. Abnormal appearance of the left hip joint which contains heterogeneous soft tissue density and gas, presumably chronic infection. 2. Chronic right ischial ulcer extending to bone with chronic osteomyelitis and ischial deformity. Small right hip joint effusion, sterility indeterminate. 3. Enlarged lymph nodes in the retroperitoneum, left external iliac chain, and both inguinal stations, less well evaluated on the current exam in the absence of contrast. 4. Mild rectal wall thickening. Electronically Signed   By: Narda Rutherford M.D.   On: 04/19/2023 23:19   VAS [place] LOWER EXTREMITY VENOUS (DVT)  Result Date: 04/19/2023  Lower Venous DVT Study Patient Name:  Steve [name] [facility]  Date of Exam:   04/19/2023 Medical Rec #: [id number]        Accession #:    [id number] Date of Birth: 05-28-1991        Patient Gender: M Patient Age:   59 years Exam Location:  [facility] Procedure:      VAS [place] LOWER EXTREMITY VENOUS (DVT) Referring Phys: Jeoffrey Massed --------------------------------------------------------------------------------  Indications: Swelling.  Risk Factors: None identified. Limitations: Open wound, bandages and patient positioning, patient immobility, bilateral leg contracture. Comparison Study: No prior studies. Performing Technologist: Chanda Busing RVT  Examination Guidelines: A complete evaluation includes B-mode imaging, spectral Doppler, color Doppler, and power Doppler as needed of all accessible portions of each vessel. Bilateral testing is considered an integral part of a complete examination. Limited examinations for reoccurring indications may be performed as noted. The reflux portion of the exam is  performed with the patient in reverse Trendelenburg.  +---------+---------------+---------+-----------+----------+-------------------+ RIGHT    CompressibilityPhasicitySpontaneityPropertiesThrombus Aging      +---------+---------------+---------+-----------+----------+-------------------+ CFV      Full           Yes      Yes                                      +---------+---------------+---------+-----------+----------+-------------------+ SFJ      Full                                                             +---------+---------------+---------+-----------+----------+-------------------+  FV Prox  Full                                                             +---------+---------------+---------+-----------+----------+-------------------+ FV Mid                  Yes      Yes                                      +---------+---------------+---------+-----------+----------+-------------------+ FV Distal               Yes      Yes                                      +---------+---------------+---------+-----------+----------+-------------------+ PFV                                                   Not well visualized +---------+---------------+---------+-----------+----------+-------------------+ POP      Full           Yes      Yes                                      +---------+---------------+---------+-----------+----------+-------------------+ PTV      Full                                                             +---------+---------------+---------+-----------+----------+-------------------+ PERO     Full                                                             +---------+---------------+---------+-----------+----------+-------------------+   +---------+---------------+---------+-----------+----------+-------------------+ LEFT     CompressibilityPhasicitySpontaneityPropertiesThrombus Aging       +---------+---------------+---------+-----------+----------+-------------------+ CFV      Full           Yes      Yes                                      +---------+---------------+---------+-----------+----------+-------------------+ SFJ      Full                                                             +---------+---------------+---------+-----------+----------+-------------------+ FV Prox  Full                                                             +---------+---------------+---------+-----------+----------+-------------------+  FV Mid   Full                                                             +---------+---------------+---------+-----------+----------+-------------------+ FV Distal               Yes      Yes                                      +---------+---------------+---------+-----------+----------+-------------------+ PFV                                                   Not well visualized +---------+---------------+---------+-----------+----------+-------------------+ POP      Full           Yes      Yes                                      +---------+---------------+---------+-----------+----------+-------------------+ PTV      Full                                                             +---------+---------------+---------+-----------+----------+-------------------+ PERO     Full                                                             +---------+---------------+---------+-----------+----------+-------------------+    Summary: RIGHT: - There is no evidence of deep vein thrombosis in the lower extremity. However, portions of this examination were limited- see technologist comments above.  - No cystic structure found in the popliteal fossa.  LEFT: - There is no evidence of deep vein thrombosis in the lower extremity. However, portions of this examination were limited- see technologist comments above.  - No cystic  structure found in the popliteal fossa.  *See table(s) above for measurements and observations.    Preliminary    DG Pelvis Portable  Result Date: 04/18/2023 CLINICAL DATA:  History of paraplegia. Sacral decubitus ulcers. Chronic osteomyelitis. Septic shock. EXAM: PORTABLE PELVIS 1-2 VIEWS COMPARISON:  AP pelvis 02/27/2014 and CT abdomen and pelvis 04/08/2023 FINDINGS: There is lucency from a large the fetus ulcer within the left buttock posterior to the left ischium. There is high-grade osseous destruction again seen within the left ischium and posterior left inferior pubic ramus. There is again superolateral dislocation of left femoral head with respect to the left acetabulum, seen to be posterior on recent CT. There is cortical step-off within the medial left acetabulum related to the subacute fracture seen on prior CT. There is callus formation and cortical thickening about the fracture left femur. The bilateral sacroiliac joint spaces are  maintained. Mild narrowing of the right femoroacetabular joint with mild right femoral head-neck junction degenerative osteophytes again seen. IMPRESSION: 1. Redemonstration of large decubitus ulcer within the left buttock posterior to the left ischium. 2. High-grade osseous destruction again seen within the left ischium and posterior left inferior pubic ramus consistent with osteomyelitis. 3. Superolateral dislocation of the left femoral head with respect to the left acetabulum, seen to be posterior on recent CT. 4. Subacute fracture of the medial left acetabulum. Electronically Signed   By: Neita Garnet M.D.   On: 04/18/2023 16:19     Scheduled Meds:  ARIPiprazole  15 mg Oral Daily   ascorbic acid  500 mg Oral Daily   Chlorhexidine Gluconate Cloth  6 each Topical Daily   cholecalciferol  1,000 Units Oral Daily   cyanocobalamin  1,000 mcg Subcutaneous Daily   Followed by   Melene Muller ON 04/23/2023] cyanocobalamin  1,000 mcg Subcutaneous Q30 days   enoxaparin  (LOVENOX) injection  40 mg Subcutaneous Q24H   feeding supplement  237 mL Oral TID BM   leptospermum manuka honey  1 Application Topical Daily   lidocaine (PF)  10 mL Infiltration Once   midodrine  10 mg Oral Q8H   multivitamin with minerals  1 tablet Oral Daily   nutrition supplement (JUVEN)  1 packet Oral BID BM   nystatin   Topical TID   pantoprazole  40 mg Oral Daily   sodium chloride  1 g Oral BID WC   vitamin A  10,000 Units Oral Daily   vitamin E  400 Units Oral Daily   zinc sulfate  220 mg Oral Daily   Continuous Infusions:  cefTRIAXone (ROCEPHIN)  IV 2 g (04/20/23 1108)   DAPTOmycin (CUBICIN) 600 mg in sodium chloride 0.9 % IVPB 600 mg (04/19/23 1345)     LOS: 11 days   Jeoffrey Massed, MD [facility]  If 7PM-7AM, please contact night-coverage [email or web address]  04/20/2023, 11:35 AM

## 2023-04-21 DIAGNOSIS — M0009 Staphylococcal polyarthritis: Secondary | ICD-10-CM | POA: Diagnosis not present

## 2023-04-21 DIAGNOSIS — R6521 Severe sepsis with septic shock: Secondary | ICD-10-CM | POA: Diagnosis not present

## 2023-04-21 DIAGNOSIS — A419 Sepsis, unspecified organism: Secondary | ICD-10-CM | POA: Diagnosis not present

## 2023-04-21 DIAGNOSIS — F1510 Other stimulant abuse, uncomplicated: Secondary | ICD-10-CM | POA: Diagnosis not present

## 2023-04-21 LAB — BPAM RBC
Blood Product Expiration Date: 202408262359
ISSUE DATE / TIME: 202408041249
Unit Type and Rh: 6200

## 2023-04-21 LAB — BASIC METABOLIC PANEL
Anion gap: 9 (ref 5–15)
BUN: 8 mg/dL (ref 6–20)
CO2: 23 mmol/L (ref 22–32)
Calcium: 7.2 mg/dL — ABNORMAL LOW (ref 8.9–10.3)
Chloride: 96 mmol/L — ABNORMAL LOW (ref 98–111)
Creatinine, Ser: 0.64 mg/dL (ref 0.61–1.24)
GFR, Estimated: >60 mL/min (ref >60)
Glucose, Bld: 122 mg/dL — ABNORMAL HIGH (ref 70–99)
Potassium: 3.5 mmol/L (ref 3.5–5.1)
Sodium: 128 mmol/L — ABNORMAL LOW (ref 135–145)

## 2023-04-21 LAB — TYPE AND SCREEN
ABO/RH(D): A POS
Antibody Screen: NEGATIVE
Unit division: 0

## 2023-04-21 LAB — CBC
HCT: 21.9 % — ABNORMAL LOW (ref 39.0–52.0)
Hemoglobin: 6.7 g/dL — CL (ref 13.0–17.0)
MCH: 28.2 pg (ref 26.0–34.0)
MCHC: 30.6 g/dL (ref 30.0–36.0)
MCV: 92 fL (ref 80.0–100.0)
Platelets: 230 10*3/uL (ref 150–400)
RBC: 2.38 MIL/uL — ABNORMAL LOW (ref 4.22–5.81)
RDW: 16.7 % — ABNORMAL HIGH (ref 11.5–15.5)
WBC: 6.5 10*3/uL (ref 4.0–10.5)
nRBC: 0 % (ref 0.0–0.2)

## 2023-04-21 LAB — HEMOGLOBIN AND HEMATOCRIT, BLOOD
HCT: 25.1 % — ABNORMAL LOW (ref 39.0–52.0)
Hemoglobin: 7.8 g/dL — ABNORMAL LOW (ref 13.0–17.0)

## 2023-04-21 LAB — PREPARE RBC (CROSSMATCH)

## 2023-04-21 MED ORDER — ACETAMINOPHEN 325 MG PO TABS
650.0000 mg | ORAL_TABLET | Freq: Once | ORAL | Status: AC
  Administered 2023-04-21: 650 mg via ORAL
  Filled 2023-04-21: qty 2

## 2023-04-21 MED ORDER — SODIUM CHLORIDE 0.9% IV SOLUTION: Freq: Once | INTRAVENOUS | Status: AC

## 2023-04-21 MED ORDER — DIPHENHYDRAMINE HCL 25 MG PO CAPS
25.0000 mg | ORAL_CAPSULE | Freq: Once | ORAL | Status: AC
  Administered 2023-04-21: 25 mg via ORAL
  Filled 2023-04-21: qty 1

## 2023-04-21 NOTE — Plan of Care (Signed)

## 2023-04-21 NOTE — Progress Notes (Addendum)
PROGRESS NOTE    Steve [name]  KGM:[id number] DOB: Jan 27, 1991 DOA: 04/08/2023 PCP: [facility]   Brief Narrative:  32 year old male with with history of paraplegia-known sacral decubitus ulcers with chronic osteomyelitis, substance abuse, chronic hypotension on midodrine-presented with septic shock in the setting of polymicrobial bacteremia and acute on chronic osteomyelitis of sacral decubitus ulcers and left hip septic arthritis.  Initially admitted to the ICU-stabilized and subsequently transferred to [facility].  Evaluated by orthopedics-no surgical interventions recommended.  Evaluated by ID-with plans for 6 weeks of IV daptomycin/Rocephin.  Unfortunately-further hospital course complicated by intermittent fevers-upon further evaluation with MRI pelvis-he was found to have abscess.  See below for further details.    Social worker following for placement-as patient is not a candidate for outpatient IV antibiotics (history of drug use).   Assessment & Plan: Septic shock-POA Acute on chronic osteomyelitis septic arthritis of the left hip  Polymicrobial bacteremia with with MRSA, Group C Streptococcus and Proteus mirabilis  Proteus mirabilis UTI Overall better-but continues to have intermittent fever-MRI pelvis on 8/3 showed pelvic abscess-discussed with Dr. Lajoyce Corners on 8/3-he will evaluate and provide further recommendations.  If surgical drainage is not possible-will get IR opinion. Repeat blood cultures negative No diarrhea Dopplers negative for DVT Remains on daptomycin/Rocephin-per prior notes-no need to pursue TEE-as patient will be on IV antibiotics for prolonged course.  End date of treatment 9/4.   Acute on chronic normocytic anemia  Secondary to acute illness-superimposed on anemia of chronic disease Continue to follow CBC-last transfusion on 7/29.  Hypotension BP soft but stable on midodrine.  Hyponatremia Continues to mild hyponatremia-likely from free water  intake-continue fluid restriction 1.2 L, continue salt tablets Since BP soft-holding off on using diuretics/Samsca.   Thankfully patient asymptomatic-will continue to follow with salt tablets/fluid restrictions.  Normocytic anemia Due to acute illness/chronic inflammation from sacral osteomyelitis/ulcers Hemoglobin down to 6.7 this morning-no acute bleeding-suspect this is due to ongoing acute illness/inflammation Transfuse 1 unit of PRBC today Follow CBC tomorrow morning.   GERD PPI  Vitamin D deficiency continue with supplements  Vitamin B12 deficiency Continue supplementation  Vitamin A, C, D deficiency, zinc deficiency Continue with supplements   Neurogenic bladder/UTI Paraplegia Continue Foley, urine culture growing Proteus, continue with Rocephin   Schizophrenia/PTSD Seems stable Continue Abilify  Substance abuse  Self reports use of methamphetamines   Pressure ulcers -Continue with wound care, started on vitamin C and zinc Pressure Injury 05/30/21 Ankle Left;Lateral Unstageable - Full thickness tissue loss in which the base of the injury is covered by slough (yellow, tan, gray, green or brown) and/or eschar (tan, brown or black) in the wound bed. (Active)  05/30/21 1938  Location: Ankle  Location Orientation: Left;Lateral  Staging: Unstageable - Full thickness tissue loss in which the base of the injury is covered by slough (yellow, tan, gray, green or brown) and/or eschar (tan, brown or black) in the wound bed.  Wound Description (Comments):   Present on Admission: Yes     Pressure Injury 08/29/21 Ischial tuberosity Left Stage 4 - Full thickness tissue loss with exposed bone, tendon or muscle. [id number] (Active)  08/29/21 0100  Location: Ischial tuberosity  Location Orientation: Left  Staging: Stage 4 - Full thickness tissue loss with exposed bone, tendon or muscle.  Wound Description (Comments): [id number]  Present on Admission: Yes     Pressure Injury 04/09/23  Pretibial Right;Lateral Stage 2 -  Partial thickness loss of dermis presenting as a shallow open injury with a  red, pink wound bed without slough. full thickness, NOT a pressure injury (Active)  04/09/23 0351  Location: Pretibial  Location Orientation: Right;Lateral  Staging: Stage 2 -  Partial thickness loss of dermis presenting as a shallow open injury with a red, pink wound bed without slough.  Wound Description (Comments): full thickness, NOT a pressure injury  Present on Admission: Yes     Pressure Injury 04/09/23 Ischial tuberosity Right Stage 4 - Full thickness tissue loss with exposed bone, tendon or muscle. (Active)  04/09/23 0100  Location: Ischial tuberosity  Location Orientation: Right  Staging: Stage 4 - Full thickness tissue loss with exposed bone, tendon or muscle.  Wound Description (Comments):   Present on Admission: Yes     Pressure Injury 04/09/23 Elbow Left;Posterior Stage 2 -  Partial thickness loss of dermis presenting as a shallow open injury with a red, pink wound bed without slough. (Active)  04/09/23 0330  Location: Elbow  Location Orientation: Left;Posterior  Staging: Stage 2 -  Partial thickness loss of dermis presenting as a shallow open injury with a red, pink wound bed without slough.  Wound Description (Comments):   Present on Admission: Yes    DVT prophylaxis: enoxaparin (LOVENOX) injection 40 mg Start: 04/16/23 2200 SCDs Start: 04/09/23 0140   Code Status:   Code Status: Full Code  Family Communication: None at bedside  Status is: Inpatient  Dispo: The patient is from: Home              Anticipated d/c is to: SNF              Anticipated d/c date is:               Patient currentlymedically stable for discharge  Consultants:  PCCM, ID, orthopedic (duda)  Procedures:  7/29>> PICC line   Subjective: Lying comfortably in bed-no major issues overnight.  No complaints.  Objective: Vitals:   04/21/23 0015 04/21/23 0500 04/21/23 0511  04/21/23 0700  BP: 108/66  112/75 (!) 105/59  Pulse: (!) 102  (!) 102 (!) 105  Resp: 17  18 16   Temp: 99.8 F (37.7 C)  97.8 F (36.6 C) 98.4 F (36.9 C)  TempSrc: Oral  Oral Oral  SpO2: 94%  94% 95%  Weight:  78.5 kg    Height:        Intake/Output Summary (Last 24 hours) at 04/21/2023 1115 Last data filed at 04/21/2023 2536 Gross per 24 hour  Intake --  Output 1200 ml  Net -1200 ml   Filed Weights   04/18/23 0500 04/20/23 0500 04/21/23 0500  Weight: 82.4 kg 82 kg 78.5 kg    Examination: Gen Exam:Alert awake-not in any distress HEENT:atraumatic, normocephalic Chest: B/L clear to auscultation anteriorly CVS:S1S2 regular Abdomen:soft non tender, non distended Extremities:no edema Neurology: Paraplegic. Skin: no rash  Data Reviewed: I have personally reviewed following labs and imaging studies  CBC: Recent Labs  Lab 04/16/23 2345 04/18/23 0810 04/19/23 0557 04/20/23 1208 04/21/23 0837  WBC 7.5 9.0 6.7 5.4 6.5  NEUTROABS  --   --  5.3 4.1  --   HGB 7.7* 8.2* 7.9* 7.1* 6.7*  HCT 24.0* 25.8* 25.6* 22.8* 21.9*  MCV 88.9 87.2 88.9 89.8 92.0  PLT 259 269 PLATELET CLUMPS NOTED ON SMEAR, UNABLE TO ESTIMATE 216 230   Basic Metabolic Panel: Recent Labs  Lab 04/16/23 2345 04/18/23 0810 04/19/23 0557 04/20/23 1208 04/21/23 0837  NA 127* 126* 127* 130* 128*  K 4.1 4.2 4.0 4.1  3.5  CL 94* 94* 95* 94* 96*  CO2 24 24 20* 26 23  GLUCOSE 91 76 87 99 122*  BUN 9 7 7 7 8   CREATININE 0.57* 0.49* 0.49* 0.70 0.64  CALCIUM 7.5* 7.5* 7.4* 7.4* 7.2*   GFR: Estimated Creatinine Clearance: 139.9 mL/min (by C-G formula based on SCr of 0.64 mg/dL). Liver Function Tests: Recent Labs  Lab 04/19/23 0557  AST 22  ALT 17  ALKPHOS 127*  BILITOT 0.2*  PROT 6.6  ALBUMIN <1.5*    No results for input(s): "LIPASE", "AMYLASE" in the last 168 hours.  No results for input(s): "AMMONIA" in the last 168 hours. Coagulation Profile: No results for input(s): "INR", "PROTIME" in the  last 168 hours.  Cardiac Enzymes: Recent Labs  Lab 04/19/23 0557  CKTOTAL 31*   BNP (last 3 results) No results for input(s): "PROBNP" in the last 8760 hours. HbA1C: No results for input(s): "HGBA1C" in the last 72 hours. CBG: No results for input(s): "GLUCAP" in the last 168 hours.  Lipid Profile: No results for input(s): "CHOL", "HDL", "LDLCALC", "TRIG", "CHOLHDL", "LDLDIRECT" in the last 72 hours. Thyroid Function Tests: Recent Labs    04/18/23 1343  TSH 1.914   Anemia Panel: No results for input(s): "VITAMINB12", "FOLATE", "FERRITIN", "TIBC", "IRON", "RETICCTPCT" in the last 72 hours.   Sepsis Labs: Recent Labs  Lab 04/18/23 1343 04/20/23 1208  PROCALCITON 11.94 3.83     Recent Results (from the past 240 hour(s))  Culture, blood (Routine X 2) w Reflex to ID Panel     Status: None (Preliminary result)   Collection Time: 04/18/23  8:06 AM   Specimen: BLOOD RIGHT ARM  Result Value Ref Range Status   Specimen Description BLOOD RIGHT ARM  Final   Special Requests   Final    BOTTLES DRAWN AEROBIC AND ANAEROBIC Blood Culture adequate volume   Culture  Setup Time   Final    GRAM POSITIVE COCCI IN CHAINS ANAEROBIC BOTTLE ONLY CRITICAL VALUE NOTED.  VALUE IS CONSISTENT WITH PREVIOUSLY REPORTED AND CALLED VALUE. Performed at [facility], [address] [place], [place] [address]    Culture GRAM POSITIVE COCCI  Final   Report Status PENDING  Incomplete  Culture, blood (Routine X 2) w Reflex to ID Panel     Status: None (Preliminary result)   Collection Time: 04/18/23  8:10 AM   Specimen: BLOOD LEFT HAND  Result Value Ref Range Status   Specimen Description BLOOD LEFT HAND  Final   Special Requests   Final    BOTTLES DRAWN AEROBIC ONLY Blood Culture adequate volume   Culture   Final    NO GROWTH 3 DAYS Performed at [facility], [address], [place], [place] [address]    Report Status PENDING  Incomplete         Radiology Studies: MR  PELVIS W WO CONTRAST  Addendum Date: 04/20/2023   ADDENDUM REPORT: 04/20/2023 16:19 ADDENDUM: There was a dictation error in the final (second) sentence of the first impression. The word "no" should not be present. Impression #1 should read: 1. There are large posterior decubitus ulcers extending to the posterior left ischium greater than the posterior right ischium, and the posterior left proximal femur. Rim enhancing collection posterior to the right proximal femur concerning for an abscess, as described above. Electronically Signed   By: Neita Garnet M.D.   On: 04/20/2023 16:19   Result Date: 04/20/2023 CLINICAL DATA:  Soft tissue infection  suspected, pelvis. EXAM: MRI PELVIS WITHOUT CONTRAST TECHNIQUE: Multiplanar multisequence MR imaging of the pelvis was performed. No intravenous contrast was administered. COMPARISON:  CT pelvis 04/19/2023, AP pelvis 04/18/2023; limited MRI pelvis (2 sequences) 07/28/2021) FINDINGS: The technologist reports the patient was unable to lie flat and was imaged in oblique positioning. Musculoskeletal: There is again a large soft tissue ulcer posterior the inferior left hemipelvis, with extension to the inferior left ischium. There is again high-grade osseous destruction again seen within the left ischium and posterior left inferior pubic ramus from chronic osteomyelitis. There is again superior lateral dislocation of the left femoral head with respect to the left acetabulum. A subacute medial left acetabular fracture is again noted. There is cortical thickening and heterotopic bone formation about the proximal left femoral diaphysis and intertrochanteric region, better seen on recent CT. There is scattered air again seen around the proximal left femur. Diffuse marrow edema throughout the proximal left femur. There is heterogeneous predominantly decreased T1 but with both decreased and increased T2 signal and diffuse heterogeneous enhancement around the proximal femur and  extending into the left acetabulum, concerning for inflammatory phlegmon. There is a region of air measuring up to 2.2 cm posterolateral to the proximal left femoral diaphysis at the deep aspect of an additional dorsal decubitus ulcer (axial series 4, images 46 through 49). There is relatively decreased enhancement within the soft tissues immediately surrounding this ulcer (axial series 11, image 51) which may represent devitalized tissue. There is an additional region of apparently decreased T1 and increased T2 signal with thick peripheral enhancement just anterior to the intertrochanteric region of the proximal left femur suspicious for an abscess measuring up to approximately 1.8 x 4.2 x 4.5 cm (transverse by AP by craniocaudal). At the superior aspect, there appears to be a "neck" of this rim enhancing fluid that extends proximally to the region of the left iliopsoas musculotendinous junction (axial series 11 images 33 through 45). There is high-grade heterogeneous enhancement and thickening of the left iliacus muscle and the left gluteal muscles, greater than the proximal left quadriceps muscles. There appear to be numerous areas of infected myositis and possible small enhancing abscesses. There is avulsion of the common left hamstring origins secondary to the chronic erosion of the left ischial tuberosity (axial series 11, image 45). There is again a large dorsal right buttock ulcer extending towards the right ischium. There is again chronic posterior right ischial tuberosity erosion from acute on chronic osteomyelitis. Additional rim enhancing collection concerning for abscess posterior to the proximal right femur at the greater trochanter (axial series 11 images 45 through 55). This measures up to approximately 2 x 5 x 6 cm (transverse by AP by craniocaudal, axial series 2, image 47 and coronal series 10, image 27). Other findings The urinary bladder is decompressed by indwelling Foley catheter. There is  again mild rectal wall thickening. Left greater than right inguinal and left external iliac chain lymphadenopathy is again noted, likely reactive. IMPRESSION: 1. There are large posterior decubitus ulcers extending to the posterior left ischium greater than the posterior right ischium, and the posterior left proximal femur. No rim enhancing collection posterior to the right proximal femur concerning for an abscess, as described above. 2. Chronic osseous destruction greatest within the posterior left ischium but also seen within the posterior right ischium and proximal posterior left femur again concerning for osteomyelitis. 3. Redemonstration of superior dislocation of the left femur head with respect to the left acetabulum. 4. Diffuse heterogeneous soft  tissue and heterogeneous enhancement around the proximal left femur and left acetabulum concerning for multiple small abscesses. Electronically Signed: By: Neita Garnet M.D. On: 04/20/2023 15:53   VAS [place] LOWER EXTREMITY VENOUS (DVT)  Result Date: 04/20/2023  Lower Venous DVT Study Patient Name:  ZAEDEN LASTINGER [facility]  Date of Exam:   04/19/2023 Medical Rec #: [id number]        Accession #:    [id number] Date of Birth: Feb 05, 1991        Patient Gender: M Patient Age:   89 years Exam Location:  [facility] Procedure:      VAS [place] LOWER EXTREMITY VENOUS (DVT) Referring Phys: Jeoffrey Massed --------------------------------------------------------------------------------  Indications: Swelling.  Risk Factors: None identified. Limitations: Open wound, bandages and patient positioning, patient immobility, bilateral leg contracture. Comparison Study: No prior studies. Performing Technologist: Chanda Busing RVT  Examination Guidelines: A complete evaluation includes B-mode imaging, spectral Doppler, color Doppler, and power Doppler as needed of all accessible portions of each vessel. Bilateral testing is considered an integral part of a complete examination. Limited  examinations for reoccurring indications may be performed as noted. The reflux portion of the exam is performed with the patient in reverse Trendelenburg.  +---------+---------------+---------+-----------+----------+-------------------+ RIGHT    CompressibilityPhasicitySpontaneityPropertiesThrombus Aging      +---------+---------------+---------+-----------+----------+-------------------+ CFV      Full           Yes      Yes                                      +---------+---------------+---------+-----------+----------+-------------------+ SFJ      Full                                                             +---------+---------------+---------+-----------+----------+-------------------+ FV Prox  Full                                                             +---------+---------------+---------+-----------+----------+-------------------+ FV Mid                  Yes      Yes                                      +---------+---------------+---------+-----------+----------+-------------------+ FV Distal               Yes      Yes                                      +---------+---------------+---------+-----------+----------+-------------------+ PFV                                                   Not well visualized +---------+---------------+---------+-----------+----------+-------------------+ POP  Full           Yes      Yes                                      +---------+---------------+---------+-----------+----------+-------------------+ PTV      Full                                                             +---------+---------------+---------+-----------+----------+-------------------+ PERO     Full                                                             +---------+---------------+---------+-----------+----------+-------------------+   +---------+---------------+---------+-----------+----------+-------------------+ LEFT      CompressibilityPhasicitySpontaneityPropertiesThrombus Aging      +---------+---------------+---------+-----------+----------+-------------------+ CFV      Full           Yes      Yes                                      +---------+---------------+---------+-----------+----------+-------------------+ SFJ      Full                                                             +---------+---------------+---------+-----------+----------+-------------------+ FV Prox  Full                                                             +---------+---------------+---------+-----------+----------+-------------------+ FV Mid   Full                                                             +---------+---------------+---------+-----------+----------+-------------------+ FV Distal               Yes      Yes                                      +---------+---------------+---------+-----------+----------+-------------------+ PFV                                                   Not well visualized +---------+---------------+---------+-----------+----------+-------------------+ POP      Full  Yes      Yes                                      +---------+---------------+---------+-----------+----------+-------------------+ PTV      Full                                                             +---------+---------------+---------+-----------+----------+-------------------+ PERO     Full                                                             +---------+---------------+---------+-----------+----------+-------------------+     Summary: RIGHT: - There is no evidence of deep vein thrombosis in the lower extremity. However, portions of this examination were limited- see technologist comments above.  - No cystic structure found in the popliteal fossa.  LEFT: - There is no evidence of deep vein thrombosis in the lower extremity. However, portions of this  examination were limited- see technologist comments above.  - No cystic structure found in the popliteal fossa.  *See table(s) above for measurements and observations. Electronically signed by Heath Lark on 04/20/2023 at 1:24:19 PM.    Final    CT PELVIS WO CONTRAST  Result Date: 04/19/2023 CLINICAL DATA:  Soft tissue infection suspected, pelvis, xray done reassess the bony lesions in left hip Electronic records indicates sacral wounds, postop. EXAM: CT PELVIS WITHOUT CONTRAST TECHNIQUE: Multidetector CT imaging of the pelvis was performed following the standard protocol without intravenous contrast. RADIATION DOSE REDUCTION: This exam was performed according to the departmental dose-optimization program which includes automated exposure control, adjustment of the mA and/or kV according to patient size and/or use of iterative reconstruction technique. COMPARISON:  Pelvic radiograph yesterday. Abdominopelvic CT 04/08/2023 FINDINGS: Urinary Tract: The included ureters are decompressed. Foley catheter decompresses the urinary bladder. No intravesicular air. Bowel: Pelvic bowel loops suboptimally assessed on this unenhanced exam. Mild rectal wall thickening. No evidence of obstruction. Vascular/Lymphatic: Enlarged lymph nodes in the retroperitoneum, left external iliac chain, and both inguinal stations, less well assessed on the current exam in the absence of contrast. Reproductive:  Nonacute. Other:  Decubitus ulcer is described below. Musculoskeletal: The left hip is dislocated as before. Left sacra ulcer again seen extending to bone. Destructive changes involving the femoral head may related to infection or abnormal alignment. Heterotopic ossification about the left proximal femur consistent with chronic osteomyelitis. There is heterogeneous soft tissue density with soft tissue gas adjacent to the left proximal femur extending into the left hip joint. Acetabular irregularity with subacute acetabular fracture in  heterogeneous fluid in the left hip joint space. There may be additional heterogeneous fluid surrounding the upper femur, not well assessed on this unenhanced exam. Chronic osteomyelitis of the left ischium and chronic deformity of the left acetabula. Chronic right ischial ulcer extending to bone with chronic osteomyelitis and ischial deformity. Small right hip joint effusion. IMPRESSION: 1. Unchanged left hip dislocation, chronic. Sequela of chronic osteomyelitis in the left pelvis related to ischial decubitus ulcer. Heterogeneous soft tissue density and gas  related to decubitus ulcer, limited assessment for focal fluid collection in the absence of IV contrast. Suspect ill-defined fluid subjacent to the ulcer extending towards the hip joint. Abnormal appearance of the left hip joint which contains heterogeneous soft tissue density and gas, presumably chronic infection. 2. Chronic right ischial ulcer extending to bone with chronic osteomyelitis and ischial deformity. Small right hip joint effusion, sterility indeterminate. 3. Enlarged lymph nodes in the retroperitoneum, left external iliac chain, and both inguinal stations, less well evaluated on the current exam in the absence of contrast. 4. Mild rectal wall thickening. Electronically Signed   By: Narda Rutherford M.D.   On: 04/19/2023 23:19     Scheduled Meds:  sodium chloride   Intravenous Once   ARIPiprazole  15 mg Oral Daily   ascorbic acid  500 mg Oral Daily   Chlorhexidine Gluconate Cloth  6 each Topical Daily   cholecalciferol  1,000 Units Oral Daily   cyanocobalamin  1,000 mcg Subcutaneous Daily   Followed by   Melene Muller ON 04/23/2023] cyanocobalamin  1,000 mcg Subcutaneous Q30 days   enoxaparin (LOVENOX) injection  40 mg Subcutaneous Q24H   feeding supplement  237 mL Oral TID BM   leptospermum manuka honey  1 Application Topical Daily   lidocaine (PF)  10 mL Infiltration Once   midodrine  10 mg Oral Q8H   multivitamin with minerals  1 tablet  Oral Daily   nutrition supplement (JUVEN)  1 packet Oral BID BM   nystatin   Topical TID   pantoprazole  40 mg Oral Daily   sodium chloride  1 g Oral BID WC   vitamin A  10,000 Units Oral Daily   vitamin E  400 Units Oral Daily   zinc sulfate  220 mg Oral Daily   Continuous Infusions:  cefTRIAXone (ROCEPHIN)  IV 2 g (04/21/23 1111)   DAPTOmycin (CUBICIN) 600 mg in sodium chloride 0.9 % IVPB 600 mg (04/20/23 1533)     LOS: 12 days   Jeoffrey Massed, MD [facility]  If 7PM-7AM, please contact night-coverage [email or web address]  04/21/2023, 11:15 AM

## 2023-04-21 NOTE — Plan of Care (Signed)

## 2023-04-22 DIAGNOSIS — R7881 Bacteremia: Secondary | ICD-10-CM | POA: Diagnosis not present

## 2023-04-22 DIAGNOSIS — L89159 Pressure ulcer of sacral region, unspecified stage: Secondary | ICD-10-CM | POA: Diagnosis not present

## 2023-04-22 DIAGNOSIS — F209 Schizophrenia, unspecified: Secondary | ICD-10-CM

## 2023-04-22 DIAGNOSIS — R509 Fever, unspecified: Secondary | ICD-10-CM

## 2023-04-22 DIAGNOSIS — L089 Local infection of the skin and subcutaneous tissue, unspecified: Secondary | ICD-10-CM | POA: Diagnosis not present

## 2023-04-22 DIAGNOSIS — L02416 Cutaneous abscess of left lower limb: Secondary | ICD-10-CM | POA: Diagnosis not present

## 2023-04-22 DIAGNOSIS — F1910 Other psychoactive substance abuse, uncomplicated: Secondary | ICD-10-CM

## 2023-04-22 DIAGNOSIS — L02415 Cutaneous abscess of right lower limb: Secondary | ICD-10-CM

## 2023-04-22 DIAGNOSIS — M0009 Staphylococcal polyarthritis: Secondary | ICD-10-CM | POA: Diagnosis not present

## 2023-04-22 DIAGNOSIS — R6521 Severe sepsis with septic shock: Secondary | ICD-10-CM | POA: Diagnosis not present

## 2023-04-22 DIAGNOSIS — B9689 Other specified bacterial agents as the cause of diseases classified elsewhere: Secondary | ICD-10-CM | POA: Diagnosis not present

## 2023-04-22 DIAGNOSIS — F1510 Other stimulant abuse, uncomplicated: Secondary | ICD-10-CM | POA: Diagnosis not present

## 2023-04-22 DIAGNOSIS — A419 Sepsis, unspecified organism: Secondary | ICD-10-CM | POA: Diagnosis not present

## 2023-04-22 DIAGNOSIS — F4310 Post-traumatic stress disorder, unspecified: Secondary | ICD-10-CM

## 2023-04-22 MED ORDER — METRONIDAZOLE 500 MG PO TABS
500.0000 mg | ORAL_TABLET | Freq: Two times a day (BID) | ORAL | Status: DC
  Administered 2023-04-22 – 2023-04-29 (×14): 500 mg via ORAL
  Filled 2023-04-22 (×15): qty 1

## 2023-04-22 NOTE — Progress Notes (Signed)
Patient ID: Steve [name], male   DOB: [birth year], 32 y.o.   MRN: [id number] Patient is a 32 year old gentleman is seen in follow-up for sacral decubitus ulcers.  Despite antibiotic therapy patient has abscesses on both hips as seen on the MRI scan.  Will plan for surgical debridement of bilateral hip abscesses on Wednesday.

## 2023-04-22 NOTE — Plan of Care (Signed)

## 2023-04-22 NOTE — Plan of Care (Signed)
  Problem: Education: Goal: Knowledge of General Education information will improve Description: Including pain rating scale, medication(s)/side effects and non-pharmacologic comfort measures 04/22/2023 0755 by Horris Latino, RN Outcome: Progressing 04/21/2023 1759 by Horris Latino, RN Outcome: Progressing   Problem: Health Behavior/Discharge Planning: Goal: Ability to manage health-related needs will improve 04/22/2023 0755 by Horris Latino, RN Outcome: Progressing 04/21/2023 1759 by Horris Latino, RN Outcome: Progressing   Problem: Clinical Measurements: Goal: Ability to maintain clinical measurements within normal limits will improve 04/22/2023 0755 by Horris Latino, RN Outcome: Progressing 04/21/2023 1759 by Horris Latino, RN Outcome: Progressing Goal: Will remain free from infection 04/22/2023 0755 by Horris Latino, RN Outcome: Progressing 04/21/2023 1759 by Horris Latino, RN Outcome: Progressing Goal: Diagnostic test results will improve 04/22/2023 0755 by Horris Latino, RN Outcome: Progressing 04/21/2023 1759 by Horris Latino, RN Outcome: Progressing Goal: Respiratory complications will improve 04/22/2023 0755 by Horris Latino, RN Outcome: Progressing 04/21/2023 1759 by Horris Latino, RN Outcome: Progressing Goal: Cardiovascular complication will be avoided 04/22/2023 0755 by Horris Latino, RN Outcome: Progressing 04/21/2023 1759 by Horris Latino, RN Outcome: Progressing   Problem: Activity: Goal: Risk for activity intolerance will decrease 04/22/2023 0755 by Horris Latino, RN Outcome: Progressing 04/21/2023 1759 by Horris Latino, RN Outcome: Progressing   Problem: Nutrition: Goal: Adequate nutrition will be maintained 04/22/2023 0755 by Horris Latino, RN Outcome: Progressing 04/21/2023 1759 by Horris Latino, RN Outcome: Progressing   Problem: Coping: Goal: Level of anxiety will decrease 04/22/2023 0755 by Horris Latino, RN Outcome: Progressing 04/21/2023 1759 by Horris Latino, RN Outcome: Progressing    Problem: Elimination: Goal: Will not experience complications related to bowel motility 04/22/2023 0755 by Horris Latino, RN Outcome: Progressing 04/21/2023 1759 by Horris Latino, RN Outcome: Progressing Goal: Will not experience complications related to urinary retention 04/22/2023 0755 by Horris Latino, RN Outcome: Progressing 04/21/2023 1759 by Horris Latino, RN Outcome: Progressing   Problem: Pain Managment: Goal: General experience of comfort will improve 04/22/2023 0755 by Horris Latino, RN Outcome: Progressing 04/21/2023 1759 by Horris Latino, RN Outcome: Progressing   Problem: Safety: Goal: Ability to remain free from injury will improve 04/22/2023 0755 by Horris Latino, RN Outcome: Progressing 04/21/2023 1759 by Horris Latino, RN Outcome: Progressing   Problem: Skin Integrity: Goal: Risk for impaired skin integrity will decrease 04/22/2023 0755 by Horris Latino, RN Outcome: Progressing 04/21/2023 1759 by Horris Latino, RN Outcome: Progressing

## 2023-04-22 NOTE — Progress Notes (Addendum)
I have seen and examined the patient. I have personally reviewed the clinical findings, laboratory findings, microbiological data and imaging studies. The assessment and treatment plan was discussed with the Nurse Practitioner. I agree with her/his recommendations except following additions/corrections.  Fevers in last few days noted due to which MRI pelvis repeated and findings noted OR on 8/7 by Dr Lajoyce Corners noted  Exam - PICC with no concerns or rashes             No new changes in his wound reported   Blood cx 8/1 GPC in anaerobic bottle  Continue current antibiotics as likely as source control issue Fu ID of GPC. Addendum: metronidazole added given peptostreptococcus and bacteroides beta lactamase positive identified  Will see back after OR and possible new cultures to see if any adjustment in abtx needed. Please call with questions.   I have personally spent 50 minutes involved in face-to-face and non-face-to-face activities for this patient on the day of the visit. Professional time spent includes the following activities: Preparing to see the patient (review of tests), Obtaining and/or reviewing separately obtained history (admission/discharge record), Performing a medically appropriate examination and/or evaluation , Ordering medications/tests/procedures, referring and communicating with other health care professionals, Documenting clinical information in the EMR, Independently interpreting results (not separately reported), Communicating results to the patient/family/caregiver, Counseling and educating the patient/family/caregiver and Care coordination (not separately reported).   [facility]  Date of Admission:  04/08/2023      Total days of antibiotics 13   Daptomycin  Ceftriaxone          ASSESSMENT: Steve [name] is a 32 y.o. male admitted to [facility] hospital with:  #1 MRSA Bacteremia -  -BCx (+) 7/22, Negative 7/23  -BCx 8/1 now with 1/4 bottles  growing GPCs --> follow  -No TEE with prolonged plan to treat acute on chronic osteomyelitis    #2 Polymicrobial Bacteremia - MRSA, Group C Strep, Proteus Mirabilis, related to #3 -  -acute on chronic OM from multiple wounds  -Further surgery pending to attend to source control   #3 Sacral Decubitus with b/l Hip Abscesses - multiple other LE pressure wounds -Dr. Lajoyce Corners planning OR Wednesday 8/7  -Polymicrobial bacteremia with MRSA, Group C streptococcus and Proteus Mirabilis earlier in admission -Not spending much time out of bed, does not feel comfortable in our hospital bed.   #4 Fevers -  -Due to persistent abscesses r/t #3 despite antibiotic treatment --> OR planned 8/7. Continue broad empiric coverage with daptomycin + ceftriaxone and adjust pending cultures from OR   #5 Schizophrenia / PTSD -  -per primary team    #6 Substance Abuse -  -uses methamphetamines  -hepatitis screenings   Medication  Monitoring -  -weekly CK stable with last check 8/2 @ 31   Disposition -  -Anticipated D/C to SNF mentioned in notes.  -would prefer this for IV antibiotic treatment as I don't feel confident he would be able to maintain this himself  -He wants to know when he can go home - explained he needs to have surgery this admission and planned Wednesday. He verbalized understanding.  -PT has been ordered to come see him - he has been fatigued and lethargic and weak to participate - has been declining.    PLAN: Follow 8/1 blood micro GPCs  Follow op findings Please send cultures intra-op of abscesses to help with antibiotic treatment    Principal Problem:   Septic shock (  HCC) Active Problems:   Amphetamine abuse (HCC)   MRSA bacteremia   Septic arthritis (HCC)   Pressure ulcer   Wound infection    ARIPiprazole  15 mg Oral Daily   ascorbic acid  500 mg Oral Daily   Chlorhexidine Gluconate Cloth  6 each Topical Daily   cholecalciferol  1,000 Units Oral Daily   cyanocobalamin   1,000 mcg Subcutaneous Daily   Followed by   Melene Muller ON 04/23/2023] cyanocobalamin  1,000 mcg Subcutaneous Q30 days   enoxaparin (LOVENOX) injection  40 mg Subcutaneous Q24H   feeding supplement  237 mL Oral TID BM   leptospermum manuka honey  1 Application Topical Daily   lidocaine (PF)  10 mL Infiltration Once   midodrine  10 mg Oral Q8H   multivitamin with minerals  1 tablet Oral Daily   nutrition supplement (JUVEN)  1 packet Oral BID BM   nystatin   Topical TID   pantoprazole  40 mg Oral Daily   sodium chloride  1 g Oral BID WC   vitamin A  10,000 Units Oral Daily   vitamin E  400 Units Oral Daily   zinc sulfate  220 mg Oral Daily    SUBJECTIVE: Hates this bed. Feels like it is breaking his bones. Notices fevers/chills periodically. Not sure what medical treatment plan is   Review of Systems: Review of Systems  Constitutional:  Positive for chills, fever and malaise/fatigue.  Musculoskeletal:  Positive for back pain.    Allergies  Allergen Reactions   Caffeine Anaphylaxis   Chocolate Anaphylaxis   Bactrim [Sulfamethoxazole-Trimethoprim] Other (See Comments)    Reaction not listed.   Tuberculin, Ppd     Other reaction(s): Eruption of skin   Sulfa Antibiotics Rash    OBJECTIVE: Vitals:   04/21/23 2222 04/21/23 2327 04/22/23 0500 04/22/23 0632  BP: 113/64 (!) 96/59  98/65  Pulse: (!) 114 (!) 113  98  Resp: (!) 22 18  20   Temp: 100 F (37.8 C) 99.4 F (37.4 C)  97.8 F (36.6 C)  TempSrc: Oral Oral  Oral  SpO2: 95% 94%  93%  Weight:   78.5 kg   Height:       Body mass index is 24.14 kg/m.  Physical Exam Constitutional:      Appearance: He is well-developed. He is not ill-appearing.  Cardiovascular:     Rate and Rhythm: Normal rate and regular rhythm.  Pulmonary:     Effort: Pulmonary effort is normal.     Breath sounds: Normal breath sounds.  Abdominal:     General: There is no distension.     Palpations: Abdomen is soft.  Skin:    General: Skin is warm  and dry.     Capillary Refill: Capillary refill takes less than 2 seconds.     Comments: Multiple scabs / abrasions to LEs. Multiple foam dressings in place over wounds.   Neurological:     Mental Status: He is alert and oriented to person, place, and time.     Motor: Weakness (generalized) present.  Psychiatric:        Mood and Affect: Mood normal.        Behavior: Behavior normal.     Lab Results Lab Results  Component Value Date   WBC 8.4 04/22/2023   HGB 7.3 (L) 04/22/2023   HCT 23.1 (L) 04/22/2023   MCV 90.2 04/22/2023   PLT 237 04/22/2023    Lab Results  Component Value Date   CREATININE  0.60 (L) 04/22/2023   BUN 8 04/22/2023   NA 126 (L) 04/22/2023   K 4.0 04/22/2023   CL 95 (L) 04/22/2023   CO2 24 04/22/2023    Lab Results  Component Value Date   ALT 17 04/19/2023   AST 22 04/19/2023   ALKPHOS 127 (H) 04/19/2023   BILITOT 0.2 (L) 04/19/2023     Microbiology: Recent Results (from the past 240 hour(s))  Culture, blood (Routine X 2) w Reflex to ID Panel     Status: None (Preliminary result)   Collection Time: 04/18/23  8:06 AM   Specimen: BLOOD RIGHT ARM  Result Value Ref Range Status   Specimen Description BLOOD RIGHT ARM  Final   Special Requests   Final    BOTTLES DRAWN AEROBIC AND ANAEROBIC Blood Culture adequate volume   Culture  Setup Time   Final    GRAM POSITIVE COCCI IN CHAINS ANAEROBIC BOTTLE ONLY CRITICAL VALUE NOTED.  VALUE IS CONSISTENT WITH PREVIOUSLY REPORTED AND CALLED VALUE. Performed at [facility], [address] [place], [place] [address]    Culture GRAM POSITIVE COCCI  Final   Report Status PENDING  Incomplete  Culture, blood (Routine X 2) w Reflex to ID Panel     Status: None (Preliminary result)   Collection Time: 04/18/23  8:10 AM   Specimen: BLOOD LEFT HAND  Result Value Ref Range Status   Specimen Description BLOOD LEFT HAND  Final   Special Requests   Final    BOTTLES DRAWN AEROBIC ONLY Blood Culture adequate volume    Culture   Final    NO GROWTH 4 DAYS Performed at [facility], [address], [place], [place] [address]    Report Status PENDING  Incomplete   Imaging   MR PELVIS W WO CONTRAST  Addendum Date: 04/20/2023   ADDENDUM REPORT: 04/20/2023 16:19 ADDENDUM: There was a dictation error in the final (second) sentence of the first impression. The word "no" should not be present. Impression #1 should read: 1. There are large posterior decubitus ulcers extending to the posterior left ischium greater than the posterior right ischium, and the posterior left proximal femur. Rim enhancing collection posterior to the right proximal femur concerning for an abscess, as described above. Electronically Signed   By: Neita Garnet M.D.   On: 04/20/2023 16:19   Result Date: 04/20/2023 CLINICAL DATA:  Soft tissue infection suspected, pelvis. EXAM: MRI PELVIS WITHOUT CONTRAST TECHNIQUE: Multiplanar multisequence MR imaging of the pelvis was performed. No intravenous contrast was administered. COMPARISON:  CT pelvis 04/19/2023, AP pelvis 04/18/2023; limited MRI pelvis (2 sequences) 07/28/2021) FINDINGS: The technologist reports the patient was unable to lie flat and was imaged in oblique positioning. Musculoskeletal: There is again a large soft tissue ulcer posterior the inferior left hemipelvis, with extension to the inferior left ischium. There is again high-grade osseous destruction again seen within the left ischium and posterior left inferior pubic ramus from chronic osteomyelitis. There is again superior lateral dislocation of the left femoral head with respect to the left acetabulum. A subacute medial left acetabular fracture is again noted. There is cortical thickening and heterotopic bone formation about the proximal left femoral diaphysis and intertrochanteric region, better seen on recent CT. There is scattered air again seen around the proximal left femur. Diffuse marrow edema throughout the proximal left  femur. There is heterogeneous predominantly decreased T1 but with both decreased and increased T2 signal and diffuse heterogeneous enhancement around the proximal femur  and extending into the left acetabulum, concerning for inflammatory phlegmon. There is a region of air measuring up to 2.2 cm posterolateral to the proximal left femoral diaphysis at the deep aspect of an additional dorsal decubitus ulcer (axial series 4, images 46 through 49). There is relatively decreased enhancement within the soft tissues immediately surrounding this ulcer (axial series 11, image 51) which may represent devitalized tissue. There is an additional region of apparently decreased T1 and increased T2 signal with thick peripheral enhancement just anterior to the intertrochanteric region of the proximal left femur suspicious for an abscess measuring up to approximately 1.8 x 4.2 x 4.5 cm (transverse by AP by craniocaudal). At the superior aspect, there appears to be a "neck" of this rim enhancing fluid that extends proximally to the region of the left iliopsoas musculotendinous junction (axial series 11 images 33 through 45). There is high-grade heterogeneous enhancement and thickening of the left iliacus muscle and the left gluteal muscles, greater than the proximal left quadriceps muscles. There appear to be numerous areas of infected myositis and possible small enhancing abscesses. There is avulsion of the common left hamstring origins secondary to the chronic erosion of the left ischial tuberosity (axial series 11, image 45). There is again a large dorsal right buttock ulcer extending towards the right ischium. There is again chronic posterior right ischial tuberosity erosion from acute on chronic osteomyelitis. Additional rim enhancing collection concerning for abscess posterior to the proximal right femur at the greater trochanter (axial series 11 images 45 through 55). This measures up to approximately 2 x 5 x 6 cm (transverse by  AP by craniocaudal, axial series 2, image 47 and coronal series 10, image 27). Other findings The urinary bladder is decompressed by indwelling Foley catheter. There is again mild rectal wall thickening. Left greater than right inguinal and left external iliac chain lymphadenopathy is again noted, likely reactive. IMPRESSION: 1. There are large posterior decubitus ulcers extending to the posterior left ischium greater than the posterior right ischium, and the posterior left proximal femur. No rim enhancing collection posterior to the right proximal femur concerning for an abscess, as described above. 2. Chronic osseous destruction greatest within the posterior left ischium but also seen within the posterior right ischium and proximal posterior left femur again concerning for osteomyelitis. 3. Redemonstration of superior dislocation of the left femur head with respect to the left acetabulum. 4. Diffuse heterogeneous soft tissue and heterogeneous enhancement around the proximal left femur and left acetabulum concerning for multiple small abscesses. Electronically Signed: By: Neita Garnet M.D. On: 04/20/2023 15:53     Rexene Alberts, MSN, NP-C [facility] Pager: [phone]  @TODAY @ 9:44 AM

## 2023-04-22 NOTE — Progress Notes (Signed)
Physical Therapy Treatment Patient Details Name: Steve [name] MRN: [id number] DOB: 1990/12/07 Today's Date: 04/22/2023   History of Present Illness Pt is 32 yo male presenting to [facility] ER on 7/22 with reprots of weakness and malaise. Pt was found to have polymicrobial bacteremia. PMH: paraplegia, psychostimulant induced psychosis, neurogenic bladder, chronic hypotension on midodrine, chronic osteomyelitis, paranoid schizophrenia and PTSD. Pt recently admitted for infections to [facility] of bil ischial wounds.    PT Comments  Pt is progressing with goals. Currently Min A for bed mobility and performed transfers today at [facility] for bed>W/C and Mod A for W/C>bed.   Pt is at a high risk for further break down due to current mobility status and impaired activity tolerance. Due to pt current functional status recommend skilled physical therapy services at a higher level of care and frequency 5x/weekly in order to improve functional mobility to promote independence, decrease burden of care, improve skin integrity and decrease risk for re-hospitalization.      If plan is discharge home, recommend the following: Assistance with cooking/housework;Help with stairs or ramp for entrance;A lot of help with walking and/or transfers;Assist for transportation   Can travel by private vehicle     No  Equipment Recommendations  Other (comment) (defer to post acute)       Precautions / Restrictions Precautions Precautions: Fall Restrictions Weight Bearing Restrictions: No     Mobility  Bed Mobility Overal bed mobility: Needs Assistance Bed Mobility: Supine to Sit, Sit to Supine     Supine to sit: Supervision Sit to supine: Min assist   General bed mobility comments: Pt is able to lift bil LE to perform supine to sitting but requires Min A for scooting up in bed with verbal cues for sequencing Patient Response: Cooperative  Transfers Overall transfer level: Needs assistance   Transfers: Bed to  chair/wheelchair/BSC         Anterior-Posterior transfers: Min guard  Lateral/Scoot Transfers: Mod assist General transfer comment: Pt was min guard for posterior transfer EOB to W/C. Requires Mod A for transfer from W/C to EOB    Ambulation/Gait     General Gait Details: Pt si non-ambulatory at baseline.        Merchant navy officer mobility: Yes Wheelchair propulsion: Both upper extremities Wheelchair parts: Needs assistance Distance: short distances in room. Wheelchair Assistance Details (indicate cue type and reason): Pt needs Mod A navigating obstacles in the room.   Tilt Bed Tilt Bed Patient Response: Cooperative  Modified Rankin (Stroke Patients Only)       Balance Overall balance assessment: Needs assistance Sitting-balance support: Bilateral upper extremity supported Sitting balance-Leahy Scale: Fair Sitting balance - Comments: pt has significant forward bend at the trunk. No overt LOB        Cognition Arousal/Alertness: Awake/alert Behavior During Therapy: WFL for tasks assessed/performed Overall Cognitive Status: Within Functional Limits for tasks assessed       General Comments: pt asked multiple times today about when he is going to be discharged with question answered each time for date of I and D               Pertinent Vitals/Pain Pain Assessment Pain Assessment: Faces Faces Pain Scale: Hurts little more Pain Location: back Pain Descriptors / Indicators: Grimacing Pain Intervention(s): Monitored during session, Patient requesting pain meds-RN notified     PT Goals (current goals can now be found in the care plan section) Acute Rehab PT Goals Patient Stated Goal: To go  home to spouse PT Goal Formulation: With patient Time For Goal Achievement: 05/01/23 Potential to Achieve Goals: Fair Additional Goals Additional Goal #1: Pt will wheel himself in W/C 50 ft at Min A to improve independence Progress  towards PT goals: Progressing toward goals    Frequency    Min 1X/week      PT Plan Current plan remains appropriate       AM-PAC PT "6 Clicks" Mobility   Outcome Measure  Help needed turning from your back to your side while in a flat bed without using bedrails?: A Little Help needed moving from lying on your back to sitting on the side of a flat bed without using bedrails?: A Little Help needed moving to and from a bed to a chair (including a wheelchair)?: A Lot Help needed standing up from a chair using your arms (e.g., wheelchair or bedside chair)?: Total Help needed to walk in hospital room?: Total Help needed climbing 3-5 steps with a railing? : Total 6 Click Score: 11    End of Session Equipment Utilized During Treatment: Gait belt Activity Tolerance: Patient tolerated treatment well Patient left: in bed;with bed alarm set;with call bell/phone within reach Nurse Communication: Mobility status PT Visit Diagnosis: Other abnormalities of gait and mobility (R26.89)     Time: 6387-5643 PT Time Calculation (min) (ACUTE ONLY): 31 min  Charges:    $Therapeutic Activity: 23-37 mins PT General Charges $$ ACUTE PT VISIT: 1 Visit                     [name], DPT, CLT  Acute Rehabilitation Services Office: [phone] (Secure chat preferred)    [name] 04/22/2023, 4:15 PM

## 2023-04-22 NOTE — TOC Progression Note (Signed)
Transition of Care [facility]) - Progression Note    Patient Details  Name: Steve [name] MRN: [id number] Date of Birth: 12/10/1990  [facility] CM/SW Contact  Mearl Latin, LCSW Phone Number: 04/22/2023, 5:22 PM  Clinical Narrative:    CSW continuing to follow.      Barriers to Discharge: Continued Medical Work up, English as a second language teacher, SNF Pending bed offer (Substance use history w/ need for PICC)  Expected Discharge Plan and Services In-house Referral: Clinical Social Work   Post Acute Care Choice: Skilled Nursing Facility Living arrangements for the past 2 months: Single Family Home                                       Social Determinants of Health (SDOH) Interventions SDOH Screenings   Food Insecurity: No Food Insecurity (06/25/2022)  Housing: Low Risk  (06/25/2022)  Transportation Needs: No Transportation Needs (06/25/2022)  Utilities: Not At Risk (06/25/2022)  Tobacco Use: High Risk (04/08/2023)    Readmission Risk Interventions    07/01/2022   10:10 AM 06/29/2022    9:19 AM  Readmission Risk Prevention Plan  Transportation Screening Complete Complete  Medication Review Oceanographer) Complete Complete  PCP or Specialist appointment within 3-5 days of discharge Complete Complete  HRI or Home Care Consult Complete Complete  SW Recovery Care/Counseling Consult Complete Complete  Palliative Care Screening Not Applicable Not Applicable  Skilled Nursing Facility Not Applicable Not Applicable

## 2023-04-22 NOTE — H&P (View-Only) (Signed)
Patient ID: Steve [name], male   DOB: [birth year], 32 y.o.   MRN: [id number] Patient is a 32 year old gentleman is seen in follow-up for sacral decubitus ulcers.  Despite antibiotic therapy patient has abscesses on both hips as seen on the MRI scan.  Will plan for surgical debridement of bilateral hip abscesses on Wednesday.

## 2023-04-22 NOTE — Progress Notes (Signed)
PROGRESS NOTE    Steve [name]  ONG:[id number] DOB: Jun 22, 1991 DOA: 04/08/2023 PCP: [facility]   Brief Narrative:  32 year old male with with history of paraplegia-known sacral decubitus ulcers with chronic osteomyelitis, substance abuse, chronic hypotension on midodrine-presented with septic shock in the setting of polymicrobial bacteremia and acute on chronic osteomyelitis of sacral decubitus ulcers and left hip septic arthritis.  Initially admitted to the ICU-stabilized and subsequently transferred to [facility].  Evaluated by orthopedics-no surgical interventions recommended.  Evaluated by ID-with plans for 6 weeks of IV daptomycin/Rocephin.  Unfortunately-further hospital course complicated by intermittent fevers-upon further evaluation with MRI pelvis-he was found to have abscess.  See below for further details.    Social worker following for placement-as patient is not a candidate for outpatient IV antibiotics (history of drug use).   Assessment & Plan: Septic shock-POA Acute on chronic osteomyelitis septic arthritis of the left hip  Polymicrobial bacteremia with with MRSA, Group C Streptococcus and Proteus mirabilis  Proteus mirabilis UTI Overall better-but continues to have intermittent fever-MRI pelvis on 8/3 showed pelvic abscess-Dr. Lajoyce Corners planning surgical debridement on 8/7.   Continue daptomycin/Rocephin ID following Not a candidate for home IV infusion therapies given drug use Per social worker-[facility]-will remain inpatient to complete course of antibiotics.   Acute on chronic normocytic anemia  Secondary to acute illness-superimposed on anemia of chronic disease Continue to follow CBC-last transfusion on 7/29.  Hypotension BP soft but stable on midodrine.  Hyponatremia Continues to mild hyponatremia-likely from free water intake-continue fluid restriction 1.2 L, continue salt tablets Since BP soft-holding off on using diuretics/Samsca.    Thankfully patient asymptomatic-will continue to follow with salt tablets/fluid restrictions.  Normocytic anemia Due to acute illness/chronic inflammation from sacral osteomyelitis/ulcers Continue to Hb-required PRBC transfusion on 8/4.  GERD PPI  Vitamin D deficiency continue with supplements  Vitamin B12 deficiency Continue supplementation  Vitamin A, C, D deficiency, zinc deficiency Continue with supplements   Neurogenic bladder/UTI Paraplegia Continue Foley, urine culture growing Proteus, continue with Rocephin   Schizophrenia/PTSD Seems stable Continue Abilify  Substance abuse  Self reports use of methamphetamines   Pressure ulcers -Continue with wound care, started on vitamin C and zinc Pressure Injury 05/30/21 Ankle Left;Lateral Unstageable - Full thickness tissue loss in which the base of the injury is covered by slough (yellow, tan, gray, green or brown) and/or eschar (tan, brown or black) in the wound bed. (Active)  05/30/21 1938  Location: Ankle  Location Orientation: Left;Lateral  Staging: Unstageable - Full thickness tissue loss in which the base of the injury is covered by slough (yellow, tan, gray, green or brown) and/or eschar (tan, brown or black) in the wound bed.  Wound Description (Comments):   Present on Admission: Yes     Pressure Injury 08/29/21 Ischial tuberosity Left Stage 4 - Full thickness tissue loss with exposed bone, tendon or muscle. 4M0N0 (Active)  08/29/21 0100  Location: Ischial tuberosity  Location Orientation: Left  Staging: Stage 4 - Full thickness tissue loss with exposed bone, tendon or muscle.  Wound Description (Comments): [id number]  Present on Admission: Yes     Pressure Injury 04/09/23 Pretibial Right;Lateral Stage 2 -  Partial thickness loss of dermis presenting as a shallow open injury with a red, pink wound bed without slough. full thickness, NOT a pressure injury (Active)  04/09/23 0351  Location: Pretibial  Location  Orientation: Right;Lateral  Staging: Stage 2 -  Partial thickness loss of dermis presenting as  a shallow open injury with a red, pink wound bed without slough.  Wound Description (Comments): full thickness, NOT a pressure injury  Present on Admission: Yes     Pressure Injury 04/09/23 Ischial tuberosity Right Stage 4 - Full thickness tissue loss with exposed bone, tendon or muscle. (Active)  04/09/23 0100  Location: Ischial tuberosity  Location Orientation: Right  Staging: Stage 4 - Full thickness tissue loss with exposed bone, tendon or muscle.  Wound Description (Comments):   Present on Admission: Yes     Pressure Injury 04/09/23 Elbow Left;Posterior Stage 2 -  Partial thickness loss of dermis presenting as a shallow open injury with a red, pink wound bed without slough. (Active)  04/09/23 0330  Location: Elbow  Location Orientation: Left;Posterior  Staging: Stage 2 -  Partial thickness loss of dermis presenting as a shallow open injury with a red, pink wound bed without slough.  Wound Description (Comments):   Present on Admission: Yes    DVT prophylaxis: enoxaparin (LOVENOX) injection 40 mg Start: 04/16/23 2200 SCDs Start: 04/09/23 0140   Code Status:   Code Status: Full Code  Family Communication: None at bedside  Status is: Inpatient  Dispo: The patient is from: Home              Anticipated d/c is to: SNF              Anticipated d/c date is:               Patient currentlymedically stable for discharge  Consultants:  PCCM, ID, orthopedic (duda)  Procedures:  7/29>> PICC line   Subjective: No complaints.  Objective: Vitals:   04/21/23 2222 04/21/23 2327 04/22/23 0500 04/22/23 0632  BP: 113/64 (!) 96/59  98/65  Pulse: (!) 114 (!) 113  98  Resp: (!) 22 18  20   Temp: 100 F (37.8 C) 99.4 F (37.4 C)  97.8 F (36.6 C)  TempSrc: Oral Oral  Oral  SpO2: 95% 94%  93%  Weight:   78.5 kg   Height:        Intake/Output Summary (Last 24 hours) at 04/22/2023  1205 Last data filed at 04/21/2023 1951 Gross per 24 hour  Intake 376 ml  Output 1800 ml  Net -1424 ml   Filed Weights   04/20/23 0500 04/21/23 0500 04/22/23 0500  Weight: 82 kg 78.5 kg 78.5 kg    Examination: Gen Exam:Alert awake-not in any distress HEENT:atraumatic, normocephalic Chest: B/L clear to auscultation anteriorly CVS:S1S2 regular Abdomen:soft non tender, non distended Extremities:no edema Neurology: Non focal-paraplegic Skin: no rash  Data Reviewed: I have personally reviewed following labs and imaging studies  CBC: Recent Labs  Lab 04/18/23 0810 04/19/23 0557 04/20/23 1208 04/21/23 0837 04/21/23 2238 04/22/23 0532  WBC 9.0 6.7 5.4 6.5  --  8.4  NEUTROABS  --  5.3 4.1  --   --   --   HGB 8.2* 7.9* 7.1* 6.7* 7.8* 7.3*  HCT 25.8* 25.6* 22.8* 21.9* 25.1* 23.1*  MCV 87.2 88.9 89.8 92.0  --  90.2  PLT 269 PLATELET CLUMPS NOTED ON SMEAR, UNABLE TO ESTIMATE 216 230  --  237   Basic Metabolic Panel: Recent Labs  Lab 04/18/23 0810 04/19/23 0557 04/20/23 1208 04/21/23 0837 04/22/23 0532  NA 126* 127* 130* 128* 126*  K 4.2 4.0 4.1 3.5 4.0  CL 94* 95* 94* 96* 95*  CO2 24 20* 26 23 24   GLUCOSE 76 87 99 122* 94  BUN 7 7  7 8 8   CREATININE 0.49* 0.49* 0.70 0.64 0.60*  CALCIUM 7.5* 7.4* 7.4* 7.2* 7.1*   GFR: Estimated Creatinine Clearance: 139.9 mL/min (A) (by C-G formula based on SCr of 0.6 mg/dL (L)). Liver Function Tests: Recent Labs  Lab 04/19/23 0557  AST 22  ALT 17  ALKPHOS 127*  BILITOT 0.2*  PROT 6.6  ALBUMIN <1.5*    No results for input(s): "LIPASE", "AMYLASE" in the last 168 hours.  No results for input(s): "AMMONIA" in the last 168 hours. Coagulation Profile: No results for input(s): "INR", "PROTIME" in the last 168 hours.  Cardiac Enzymes: Recent Labs  Lab 04/19/23 0557  CKTOTAL 31*   BNP (last 3 results) No results for input(s): "PROBNP" in the last 8760 hours. HbA1C: No results for input(s): "HGBA1C" in the last 72  hours. CBG: No results for input(s): "GLUCAP" in the last 168 hours.  Lipid Profile: No results for input(s): "CHOL", "HDL", "LDLCALC", "TRIG", "CHOLHDL", "LDLDIRECT" in the last 72 hours. Thyroid Function Tests: No results for input(s): "TSH", "T4TOTAL", "FREET4", "T3FREE", "THYROIDAB" in the last 72 hours.  Anemia Panel: No results for input(s): "VITAMINB12", "FOLATE", "FERRITIN", "TIBC", "IRON", "RETICCTPCT" in the last 72 hours.   Sepsis Labs: Recent Labs  Lab 04/18/23 1343 04/20/23 1208  PROCALCITON 11.94 3.83     Recent Results (from the past 240 hour(s))  Culture, blood (Routine X 2) w Reflex to ID Panel     Status: None (Preliminary result)   Collection Time: 04/18/23  8:06 AM   Specimen: BLOOD RIGHT ARM  Result Value Ref Range Status   Specimen Description BLOOD RIGHT ARM  Final   Special Requests   Final    BOTTLES DRAWN AEROBIC AND ANAEROBIC Blood Culture adequate volume   Culture  Setup Time   Final    GRAM POSITIVE COCCI IN CHAINS ANAEROBIC BOTTLE ONLY CRITICAL VALUE NOTED.  VALUE IS CONSISTENT WITH PREVIOUSLY REPORTED AND CALLED VALUE. Performed at [facility], [address], [place], [place] [address]    Culture GRAM POSITIVE COCCI  Final   Report Status PENDING  Incomplete  Culture, blood (Routine X 2) w Reflex to ID Panel     Status: None (Preliminary result)   Collection Time: 04/18/23  8:10 AM   Specimen: BLOOD LEFT HAND  Result Value Ref Range Status   Specimen Description BLOOD LEFT HAND  Final   Special Requests   Final    BOTTLES DRAWN AEROBIC ONLY Blood Culture adequate volume   Culture   Final    NO GROWTH 4 DAYS Performed at [facility], [address], [place], [place] [address]    Report Status PENDING  Incomplete         Radiology Studies: MR PELVIS W WO CONTRAST  Addendum Date: 04/20/2023   ADDENDUM REPORT: 04/20/2023 16:19 ADDENDUM: There was a dictation error in the final (second) sentence of the first  impression. The word "no" should not be present. Impression #1 should read: 1. There are large posterior decubitus ulcers extending to the posterior left ischium greater than the posterior right ischium, and the posterior left proximal femur. Rim enhancing collection posterior to the right proximal femur concerning for an abscess, as described above. Electronically Signed   By: Neita Garnet M.D.   On: 04/20/2023 16:19   Result Date: 04/20/2023 CLINICAL DATA:  Soft tissue infection suspected, pelvis. EXAM: MRI PELVIS WITHOUT CONTRAST TECHNIQUE: Multiplanar multisequence MR imaging of the pelvis was performed. No intravenous contrast was  administered. COMPARISON:  CT pelvis 04/19/2023, AP pelvis 04/18/2023; limited MRI pelvis (2 sequences) 07/28/2021) FINDINGS: The technologist reports the patient was unable to lie flat and was imaged in oblique positioning. Musculoskeletal: There is again a large soft tissue ulcer posterior the inferior left hemipelvis, with extension to the inferior left ischium. There is again high-grade osseous destruction again seen within the left ischium and posterior left inferior pubic ramus from chronic osteomyelitis. There is again superior lateral dislocation of the left femoral head with respect to the left acetabulum. A subacute medial left acetabular fracture is again noted. There is cortical thickening and heterotopic bone formation about the proximal left femoral diaphysis and intertrochanteric region, better seen on recent CT. There is scattered air again seen around the proximal left femur. Diffuse marrow edema throughout the proximal left femur. There is heterogeneous predominantly decreased T1 but with both decreased and increased T2 signal and diffuse heterogeneous enhancement around the proximal femur and extending into the left acetabulum, concerning for inflammatory phlegmon. There is a region of air measuring up to 2.2 cm posterolateral to the proximal left femoral  diaphysis at the deep aspect of an additional dorsal decubitus ulcer (axial series 4, images 46 through 49). There is relatively decreased enhancement within the soft tissues immediately surrounding this ulcer (axial series 11, image 51) which may represent devitalized tissue. There is an additional region of apparently decreased T1 and increased T2 signal with thick peripheral enhancement just anterior to the intertrochanteric region of the proximal left femur suspicious for an abscess measuring up to approximately 1.8 x 4.2 x 4.5 cm (transverse by AP by craniocaudal). At the superior aspect, there appears to be a "neck" of this rim enhancing fluid that extends proximally to the region of the left iliopsoas musculotendinous junction (axial series 11 images 33 through 45). There is high-grade heterogeneous enhancement and thickening of the left iliacus muscle and the left gluteal muscles, greater than the proximal left quadriceps muscles. There appear to be numerous areas of infected myositis and possible small enhancing abscesses. There is avulsion of the common left hamstring origins secondary to the chronic erosion of the left ischial tuberosity (axial series 11, image 45). There is again a large dorsal right buttock ulcer extending towards the right ischium. There is again chronic posterior right ischial tuberosity erosion from acute on chronic osteomyelitis. Additional rim enhancing collection concerning for abscess posterior to the proximal right femur at the greater trochanter (axial series 11 images 45 through 55). This measures up to approximately 2 x 5 x 6 cm (transverse by AP by craniocaudal, axial series 2, image 47 and coronal series 10, image 27). Other findings The urinary bladder is decompressed by indwelling Foley catheter. There is again mild rectal wall thickening. Left greater than right inguinal and left external iliac chain lymphadenopathy is again noted, likely reactive. IMPRESSION: 1. There  are large posterior decubitus ulcers extending to the posterior left ischium greater than the posterior right ischium, and the posterior left proximal femur. No rim enhancing collection posterior to the right proximal femur concerning for an abscess, as described above. 2. Chronic osseous destruction greatest within the posterior left ischium but also seen within the posterior right ischium and proximal posterior left femur again concerning for osteomyelitis. 3. Redemonstration of superior dislocation of the left femur head with respect to the left acetabulum. 4. Diffuse heterogeneous soft tissue and heterogeneous enhancement around the proximal left femur and left acetabulum concerning for multiple small abscesses. Electronically Signed: By: Windy Fast  Viola M.D. On: 04/20/2023 15:53     Scheduled Meds:  ARIPiprazole  15 mg Oral Daily   ascorbic acid  500 mg Oral Daily   Chlorhexidine Gluconate Cloth  6 each Topical Daily   cholecalciferol  1,000 Units Oral Daily   cyanocobalamin  1,000 mcg Subcutaneous Daily   Followed by   Melene Muller ON 04/23/2023] cyanocobalamin  1,000 mcg Subcutaneous Q30 days   enoxaparin (LOVENOX) injection  40 mg Subcutaneous Q24H   feeding supplement  237 mL Oral TID BM   leptospermum manuka honey  1 Application Topical Daily   lidocaine (PF)  10 mL Infiltration Once   midodrine  10 mg Oral Q8H   multivitamin with minerals  1 tablet Oral Daily   nutrition supplement (JUVEN)  1 packet Oral BID BM   nystatin   Topical TID   pantoprazole  40 mg Oral Daily   sodium chloride  1 g Oral BID WC   vitamin A  10,000 Units Oral Daily   vitamin E  400 Units Oral Daily   zinc sulfate  220 mg Oral Daily   Continuous Infusions:  cefTRIAXone (ROCEPHIN)  IV 2 g (04/21/23 1111)   DAPTOmycin (CUBICIN) 600 mg in sodium chloride 0.9 % IVPB 600 mg (04/21/23 1646)     LOS: 13 days   Jeoffrey Massed, MD [facility]  If 7PM-7AM, please contact  night-coverage [email or web address]  04/22/2023, 12:05 PM

## 2023-04-23 DIAGNOSIS — F1510 Other stimulant abuse, uncomplicated: Secondary | ICD-10-CM | POA: Diagnosis not present

## 2023-04-23 DIAGNOSIS — A419 Sepsis, unspecified organism: Secondary | ICD-10-CM | POA: Diagnosis not present

## 2023-04-23 DIAGNOSIS — R6521 Severe sepsis with septic shock: Secondary | ICD-10-CM | POA: Diagnosis not present

## 2023-04-23 DIAGNOSIS — M0009 Staphylococcal polyarthritis: Secondary | ICD-10-CM | POA: Diagnosis not present

## 2023-04-23 MED ORDER — FUROSEMIDE 20 MG PO TABS
20.0000 mg | ORAL_TABLET | Freq: Every day | ORAL | Status: DC
  Administered 2023-04-23 – 2023-04-25 (×2): 20 mg via ORAL
  Filled 2023-04-23 (×2): qty 1

## 2023-04-23 NOTE — Plan of Care (Signed)
  Problem: Education: Goal: Knowledge of General Education information will improve Description: Including pain rating scale, medication(s)/side effects and non-pharmacologic comfort measures Outcome: Progressing   Problem: Health Behavior/Discharge Planning: Goal: Ability to manage health-related needs will improve Outcome: Progressing   Problem: Clinical Measurements: Goal: Ability to maintain clinical measurements within normal limits will improve Outcome: Progressing Goal: Will remain free from infection Outcome: Progressing Goal: Diagnostic test results will improve Outcome: Progressing Goal: Respiratory complications will improve Outcome: Progressing Goal: Cardiovascular complication will be avoided Outcome: Progressing   Problem: Activity: Goal: Risk for activity intolerance will decrease Outcome: Progressing   Problem: Nutrition: Goal: Adequate nutrition will be maintained Outcome: Progressing   Problem: Coping: Goal: Level of anxiety will decrease Outcome: Progressing   Problem: Elimination: Goal: Will not experience complications related to bowel motility Outcome: Progressing Goal: Will not experience complications related to urinary retention Outcome: Progressing   Problem: Pain Managment: Goal: General experience of comfort will improve Outcome: Progressing   Problem: Safety: Goal: Ability to remain free from injury will improve Outcome: Progressing   Problem: Skin Integrity: Goal: Risk for impaired skin integrity will decrease Outcome: Progressing   Problem: Education: Goal: Knowledge of General Education information will improve Description: Including pain rating scale, medication(s)/side effects and non-pharmacologic comfort measures Outcome: Progressing   Problem: Health Behavior/Discharge Planning: Goal: Ability to manage health-related needs will improve Outcome: Progressing   Problem: Clinical Measurements: Goal: Ability to maintain  clinical measurements within normal limits will improve Outcome: Progressing   Problem: Clinical Measurements: Goal: Will remain free from infection Outcome: Progressing   Problem: Clinical Measurements: Goal: Diagnostic test results will improve Outcome: Progressing   Problem: Clinical Measurements: Goal: Respiratory complications will improve Outcome: Progressing   Problem: Clinical Measurements: Goal: Cardiovascular complication will be avoided Outcome: Progressing   Problem: Activity: Goal: Risk for activity intolerance will decrease Outcome: Progressing   Problem: Nutrition: Goal: Adequate nutrition will be maintained Outcome: Progressing   Problem: Coping: Goal: Level of anxiety will decrease Outcome: Progressing   Problem: Elimination: Goal: Will not experience complications related to bowel motility Outcome: Progressing   Problem: Elimination: Goal: Will not experience complications related to urinary retention Outcome: Progressing   Problem: Pain Managment: Goal: General experience of comfort will improve Outcome: Progressing   Problem: Safety: Goal: Ability to remain free from injury will improve Outcome: Progressing   Problem: Safety: Goal: Ability to remain free from injury will improve Outcome: Progressing   Problem: Skin Integrity: Goal: Risk for impaired skin integrity will decrease Outcome: Progressing   Problem: Pain Managment: Goal: General experience of comfort will improve Outcome: Progressing   Problem: Safety: Goal: Ability to remain free from injury will improve Outcome: Progressing   Problem: Skin Integrity: Goal: Risk for impaired skin integrity will decrease Outcome: Progressing   Problem: Nutrition: Goal: Adequate nutrition will be maintained Outcome: Progressing   Problem: Activity: Goal: Risk for activity intolerance will decrease Outcome: Progressing   Problem: Clinical Measurements: Goal: Cardiovascular  complication will be avoided Outcome: Progressing   Problem: Clinical Measurements: Goal: Respiratory complications will improve Outcome: Progressing   Problem: Clinical Measurements: Goal: Diagnostic test results will improve Outcome: Progressing   Problem: Clinical Measurements: Goal: Will remain free from infection Outcome: Progressing   Problem: Clinical Measurements: Goal: Ability to maintain clinical measurements within normal limits will improve Outcome: Progressing

## 2023-04-23 NOTE — Plan of Care (Signed)

## 2023-04-23 NOTE — Anesthesia Preprocedure Evaluation (Signed)
Anesthesia Evaluation  Patient identified by MRN, date of birth, ID band Patient awake    Reviewed: Allergy & Precautions, NPO status , Patient's Chart, lab work & pertinent test results  Airway Mallampati: II  TM Distance: >3 FB Neck ROM: Full    Dental no notable dental hx. (+) Teeth Intact, Dental Advisory Given   Pulmonary Current Smoker and Patient abstained from smoking.   Pulmonary exam normal breath sounds clear to auscultation       Cardiovascular + Peripheral Vascular Disease  Normal cardiovascular exam Rhythm:Regular Rate:Normal     Neuro/Psych  PSYCHIATRIC DISORDERS Anxiety Depression  Schizophrenia  PTSD Neuromuscular disease (paraplegia)    GI/Hepatic ,GERD  ,,(+)     substance abuse    Endo/Other    Renal/GU Renal diseaseLab Results      Component                Value               Date                      NA                       126 (L)             04/23/2023                CL                       96 (L)              04/23/2023                K                        4.2                 04/23/2023                CO2                      23                  04/23/2023                BUN                      8                   04/23/2023                CREATININE               0.51 (L)            04/23/2023                GFRNONAA                 >60                 04/23/2023                CALCIUM                  7.1 (L)             04/23/2023  PHOS                     2.5                 04/14/2023                ALBUMIN                  <1.5 (L)            04/19/2023                GLUCOSE                  92                  04/23/2023            Bladder dysfunction (neurogenic bladder)      Musculoskeletal  (+) Arthritis ,    Abdominal   Peds  Hematology  (+) Blood dyscrasia, anemia On lovenox  MRSA in blood   Lab Results      Component                Value                Date                      WBC                      7.3                 04/23/2023                HGB                      7.2 (L)             04/23/2023                HCT                      22.9 (L)            04/23/2023                MCV                      90.9                04/23/2023                PLT                      220                 04/23/2023              Anesthesia Other Findings All: see list     Reproductive/Obstetrics                             Anesthesia Physical Anesthesia Plan  ASA: 3  Anesthesia Plan: General   Post-op Pain Management: Ketamine IV*   Induction: Intravenous  PONV Risk Score and Plan: 2 and Treatment may vary due to age or medical condition, Midazolam and Ondansetron  Airway Management Planned: Oral ETT  Additional  Equipment: None  Intra-op Plan:   Post-operative Plan: Extubation in OR  Informed Consent: I have reviewed the patients History and Physical, chart, labs and discussed the procedure including the risks, benefits and alternatives for the proposed anesthesia with the patient or authorized representative who has indicated his/her understanding and acceptance.     Dental advisory given  Plan Discussed with:   Anesthesia Plan Comments:         Anesthesia Quick Evaluation

## 2023-04-23 NOTE — Plan of Care (Signed)

## 2023-04-23 NOTE — Progress Notes (Signed)
PROGRESS NOTE        PATIENT DETAILS Name: Steve [name] Age: 32 y.o. Sex: male Date of Birth: [birth year] Admit Date: 04/08/2023 Admitting Physician Briant Sites, DO XBM:WUXLKG, [facility]  Brief Summary: 32 year old male with with history of paraplegia-known sacral decubitus ulcers with chronic osteomyelitis, substance abuse, chronic hypotension on midodrine-presented with septic shock in the setting of polymicrobial bacteremia and acute on chronic osteomyelitis of sacral decubitus ulcers and left hip septic arthritis.  Initially admitted to the ICU-stabilized and subsequently transferred to [facility].  Evaluated by orthopedics-no surgical interventions recommended.  Evaluated by ID-with plans for 6 weeks of IV daptomycin/Rocephin.  Unfortunately-further hospital course complicated by intermittent fevers-upon further evaluation with MRI pelvis-he was found to have abscess.  See below for further details.     Social worker following for placement-as patient is not a candidate for outpatient IV antibiotics (history of drug use)Patient is a 32 y.o.  male    Significant studies: 7/22>> CT abdomen/pelvis: Septic arthritis left hip with osteomyelitis of left ischium/inferior pubic rami.  Posterior left hip dislocation. 7/23>> TTE: EF 60-65% 8/02>> CT pelvis: Unchanged left hip dislocation-chronic osteomyelitis left pelvis related to ischemic ulcer, possible ill-defined fluid collection adjacent to the ulcer. 8/02>> bilateral lower extremity Doppler: No DVT 8/03>> MRI pelvis: Rim enhancing collection posterior right proximal femur concerning for abscess, multiple small abscesses around proximal left femur/extubate him.  Significant microbiology data: 7/22>> blood culture: Staph aureus, group C streptococcus, Proteus 7/23>> blood culture: Proteus/E. Coli 7/24>> blood culture: No growth 8/01>> blood culture: Peptostreptococcus, Bacteroides 8/01>> blood culture: No  growth  Procedures: 7/29>> PICC by IR  Consults: PCCM ID Ortho  Subjective: Lying comfortably in bed-denies any chest pain or shortness of breath.  Has numerous water bottles around him-big pitcher of water and 2 cans of soda at bedside.  Fluid restriction again emphasized.  Objective: Vitals: Blood pressure (!) 86/55, pulse 98, temperature 99.3 F (37.4 C), temperature source Oral, resp. rate (!) 21, height 5\' 11"  (1.803 m), weight 72.1 kg, SpO2 97%.   Exam: Gen Exam:Alert awake-not in any distress HEENT:atraumatic, normocephalic Chest: B/L clear to auscultation anteriorly CVS:S1S2 regular Abdomen:soft non tender, non distended Extremities:no edema Neurology: Non focal Skin: no rash  Pertinent Labs/Radiology:    Latest Ref Rng & Units 04/23/2023    4:08 AM 04/22/2023    5:32 AM 04/21/2023   10:38 PM  CBC  WBC 4.0 - 10.5 K/uL 7.3  8.4    Hemoglobin 13.0 - 17.0 g/dL 7.2  7.3  7.8   Hematocrit 39.0 - 52.0 % 22.9  23.1  25.1   Platelets 150 - 400 K/uL 220  237      Lab Results  Component Value Date   NA 126 (L) 04/23/2023   K 4.2 04/23/2023   CL 96 (L) 04/23/2023   CO2 23 04/23/2023      Assessment/Plan: Septic shock-POA Acute on chronic osteomyelitis septic arthritis of the left hip  Pelvic abscess due to chronic pelvic osteomyelitis, sacral decubitus ulcer Polymicrobial bacteremia with with MRSA, Group C Streptococcus and Proteus mirabilis  Proteus mirabilis UTI Sepsis physiology has resolved No fever for the past several days but did have intermittent fever last week No diarrhea-no DVT on Doppler ultrasound-no other sources of infection apparent MRI pelvis positive for pelvic abscesses Dr. Lajoyce Corners planning surgical debridement 8/7 ID following-remains  on daptomycin/Rocephin Note-not a candidate for home IV infusion therapies-Per social work-[facility]-and hence will remain inpatient here at [facility] to complete course of IV  antibiotics.   Acute on chronic normocytic anemia  Secondary to acute illness-superimposed on anemia of chronic disease Continue to follow CBC-last transfusion on 7/29.   Hypotension BP soft but stable on midodrine.   Hyponatremia Likely due to excessive fluid intake-has numerous cans of soda-in water bottles Again reemphasized with patient and RN regarding importance of fluid restriction Since blood pressure soft but relatively stable for the past several days-will try low-dose oral Lasix.  Remains on salt tablets. Thankfully patient asymptomatic  Continue to monitor closely.   Normocytic anemia Due to acute illness/chronic inflammation from sacral osteomyelitis/ulcers Continue to Hb-required PRBC transfusion on 8/4.   GERD PPI   Vitamin D deficiency continue with supplements   Vitamin B12 deficiency Continue supplementation   Vitamin A, C, D deficiency, zinc deficiency Continue with supplements   Neurogenic bladder/UTI Paraplegia Continue Foley, urine culture growing Proteus, continue with Rocephin   Schizophrenia/PTSD Seems stable Continue Abilify   Substance abuse  Self reports use of methamphetamines    3 mm left solid pulmonary nodule Further workup deferred to outpatient setting-as this is an incidental finding  Nutrition Status: Nutrition Problem: Inadequate oral intake Etiology: decreased appetite Signs/Symptoms: per patient/family report Interventions: Refer to RD note for recommendations    Pressure Ulcer: Pressure Injury 05/30/21 Ankle Left;Lateral Unstageable - Full thickness tissue loss in which the base of the injury is covered by slough (yellow, tan, gray, green or brown) and/or eschar (tan, brown or black) in the wound bed. (Active)  05/30/21 1938  Location: Ankle  Location Orientation: Left;Lateral  Staging: Unstageable - Full thickness tissue loss in which the base of the injury is covered by slough (yellow, tan, gray, green or brown)  and/or eschar (tan, brown or black) in the wound bed.  Wound Description (Comments):   Present on Admission: Yes  Dressing Type Foam - Lift dressing to assess site every shift 04/23/23 0730     Pressure Injury 08/29/21 Ischial tuberosity Left Stage 4 - Full thickness tissue loss with exposed bone, tendon or muscle. [id number] (Active)  08/29/21 0100  Location: Ischial tuberosity  Location Orientation: Left  Staging: Stage 4 - Full thickness tissue loss with exposed bone, tendon or muscle.  Wound Description (Comments): [id number]  Present on Admission: Yes  Dressing Type Foam - Lift dressing to assess site every shift 04/23/23 0730     Pressure Injury 04/09/23 Pretibial Right;Lateral Stage 2 -  Partial thickness loss of dermis presenting as a shallow open injury with a red, pink wound bed without slough. full thickness, NOT a pressure injury (Active)  04/09/23 0351  Location: Pretibial  Location Orientation: Right;Lateral  Staging: Stage 2 -  Partial thickness loss of dermis presenting as a shallow open injury with a red, pink wound bed without slough.  Wound Description (Comments): full thickness, NOT a pressure injury  Present on Admission: Yes  Dressing Type Foam - Lift dressing to assess site every shift 04/23/23 0730     Pressure Injury 04/09/23 Ischial tuberosity Right Stage 4 - Full thickness tissue loss with exposed bone, tendon or muscle. (Active)  04/09/23 0100  Location: Ischial tuberosity  Location Orientation: Right  Staging: Stage 4 - Full thickness tissue loss with exposed bone, tendon or muscle.  Wound Description (Comments):   Present on Admission: Yes  Dressing Type  Foam - Lift dressing to assess site every shift 04/23/23 0730     Pressure Injury 04/09/23 Elbow Left;Posterior Stage 2 -  Partial thickness loss of dermis presenting as a shallow open injury with a red, pink wound bed without slough. (Active)  04/09/23 0330  Location: Elbow  Location Orientation: Left;Posterior   Staging: Stage 2 -  Partial thickness loss of dermis presenting as a shallow open injury with a red, pink wound bed without slough.  Wound Description (Comments):   Present on Admission: Yes  Dressing Type Foam - Lift dressing to assess site every shift 04/23/23 0730    BMI : Estimated body mass index is 22.17 kg/m as calculated from the following:   Height as of this encounter: 5\' 11"  (1.803 m).   Weight as of this encounter: 72.1 kg.   Code status:   Code Status: Full Code   DVT Prophylaxis: enoxaparin (LOVENOX) injection 40 mg Start: 04/16/23 2200 SCDs Start: 04/09/23 0140   Family Communication: None at bedside   Disposition Plan: Status is: Inpatient Remains inpatient appropriate because: Severity of illness   Planned Discharge Destination:Home if willing but will remain inpatient to complete approximately 6 weeks of IV antibiotics-as patient not a candidate for outpatient therapies.   Diet: Diet Order             Diet regular Room service appropriate? Yes with Assist; Fluid consistency: Thin; Fluid restriction: 1500 mL Fluid  Diet effective now                     Antimicrobial agents: Anti-infectives (From admission, onward)    Start     Dose/Rate Route Frequency Ordered Stop   04/22/23 1415  metroNIDAZOLE (FLAGYL) tablet 500 mg        500 mg Oral Every 12 hours 04/22/23 1326     04/11/23 1400  vancomycin (VANCOCIN) IVPB 1000 mg/200 mL premix  Status:  Discontinued        1,000 mg 200 mL/hr over 60 Minutes Intravenous Every 8 hours 04/11/23 0828 04/11/23 0942   04/11/23 1100  cefTRIAXone (ROCEPHIN) 2 g in sodium chloride 0.9 % 100 mL IVPB        2 g 200 mL/hr over 30 Minutes Intravenous Every 24 hours 04/11/23 0942     04/11/23 1030  DAPTOmycin (CUBICIN) 600 mg in sodium chloride 0.9 % IVPB        8 mg/kg  76 kg 124 mL/hr over 30 Minutes Intravenous Daily 04/11/23 0942     04/10/23 1200  cefTRIAXone (ROCEPHIN) 2 g in sodium chloride 0.9 % 100 mL  IVPB  Status:  Discontinued        2 g 200 mL/hr over 30 Minutes Intravenous Every 24 hours 04/10/23 1101 04/10/23 1728   04/09/23 1800  vancomycin (VANCOCIN) IVPB 1000 mg/200 mL premix  Status:  Discontinued        1,000 mg 200 mL/hr over 60 Minutes Intravenous Every 12 hours 04/09/23 1130 04/11/23 0828   04/09/23 0500  vancomycin (VANCOCIN) IVPB 1000 mg/200 mL premix  Status:  Discontinued        1,000 mg 200 mL/hr over 60 Minutes Intravenous Every 8 hours 04/09/23 0211 04/09/23 1130   04/09/23 0500  piperacillin-tazobactam (ZOSYN) IVPB 3.375 g  Status:  Discontinued        3.375 g 12.5 mL/hr over 240 Minutes Intravenous Every 8 hours 04/09/23 0211 04/10/23 0825   04/08/23 2015  vancomycin (VANCOREADY) IVPB 1500 mg/300 mL  1,500 mg 150 mL/hr over 120 Minutes Intravenous  Once 04/08/23 2001 04/08/23 2323   04/08/23 2000  piperacillin-tazobactam (ZOSYN) IVPB 3.375 g        3.375 g 100 mL/hr over 30 Minutes Intravenous  Once 04/08/23 1949 04/08/23 2044        MEDICATIONS: Scheduled Meds:  ARIPiprazole  15 mg Oral Daily   ascorbic acid  500 mg Oral Daily   Chlorhexidine Gluconate Cloth  6 each Topical Daily   cholecalciferol  1,000 Units Oral Daily   cyanocobalamin  1,000 mcg Subcutaneous Q30 days   enoxaparin (LOVENOX) injection  40 mg Subcutaneous Q24H   feeding supplement  237 mL Oral TID BM   furosemide  20 mg Oral Daily   leptospermum manuka honey  1 Application Topical Daily   lidocaine (PF)  10 mL Infiltration Once   metroNIDAZOLE  500 mg Oral Q12H   midodrine  10 mg Oral Q8H   multivitamin with minerals  1 tablet Oral Daily   nutrition supplement (JUVEN)  1 packet Oral BID BM   nystatin   Topical TID   pantoprazole  40 mg Oral Daily   sodium chloride  1 g Oral BID WC   vitamin A  10,000 Units Oral Daily   vitamin E  400 Units Oral Daily   zinc sulfate  220 mg Oral Daily   Continuous Infusions:  cefTRIAXone (ROCEPHIN)  IV 2 g (04/23/23 1307)   DAPTOmycin  (CUBICIN) 600 mg in sodium chloride 0.9 % IVPB 600 mg (04/23/23 1401)   PRN Meds:.acetaminophen, alum & mag hydroxide-simeth, baclofen, calcium carbonate, docusate sodium, morphine injection, ondansetron (ZOFRAN) IV, mouth rinse, oxyCODONE, polyethylene glycol   I have personally reviewed following labs and imaging studies  LABORATORY DATA: CBC: Recent Labs  Lab 04/19/23 0557 04/20/23 1208 04/21/23 0837 04/21/23 2238 04/22/23 0532 04/23/23 0408  WBC 6.7 5.4 6.5  --  8.4 7.3  NEUTROABS 5.3 4.1  --   --   --   --   HGB 7.9* 7.1* 6.7* 7.8* 7.3* 7.2*  HCT 25.6* 22.8* 21.9* 25.1* 23.1* 22.9*  MCV 88.9 89.8 92.0  --  90.2 90.9  PLT PLATELET CLUMPS NOTED ON SMEAR, UNABLE TO ESTIMATE 216 230  --  237 220    Basic Metabolic Panel: Recent Labs  Lab 04/19/23 0557 04/20/23 1208 04/21/23 0837 04/22/23 0532 04/23/23 0408  NA 127* 130* 128* 126* 126*  K 4.0 4.1 3.5 4.0 4.2  CL 95* 94* 96* 95* 96*  CO2 20* 26 23 24 23   GLUCOSE 87 99 122* 94 92  BUN 7 7 8 8 8   CREATININE 0.49* 0.70 0.64 0.60* 0.51*  CALCIUM 7.4* 7.4* 7.2* 7.1* 7.1*    GFR: Estimated Creatinine Clearance: 133.9 mL/min (A) (by C-G formula based on SCr of 0.51 mg/dL (L)).  Liver Function Tests: Recent Labs  Lab 04/19/23 0557  AST 22  ALT 17  ALKPHOS 127*  BILITOT 0.2*  PROT 6.6  ALBUMIN <1.5*   No results for input(s): "LIPASE", "AMYLASE" in the last 168 hours. No results for input(s): "AMMONIA" in the last 168 hours.  Coagulation Profile: No results for input(s): "INR", "PROTIME" in the last 168 hours.  Cardiac Enzymes: Recent Labs  Lab 04/19/23 0557  CKTOTAL 31*    BNP (last 3 results) No results for input(s): "PROBNP" in the last 8760 hours.  Lipid Profile: No results for input(s): "CHOL", "HDL", "LDLCALC", "TRIG", "CHOLHDL", "LDLDIRECT" in the last 72 hours.  Thyroid Function Tests: No  results for input(s): "TSH", "T4TOTAL", "FREET4", "T3FREE", "THYROIDAB" in the last 72 hours.  Anemia  Panel: No results for input(s): "VITAMINB12", "FOLATE", "FERRITIN", "TIBC", "IRON", "RETICCTPCT" in the last 72 hours.  Urine analysis:    Component Value Date/Time   COLORURINE AMBER (A) 04/09/2023 0016   APPEARANCEUR TURBID (A) 04/09/2023 0016   APPEARANCEUR Hazy 05/08/2013 0330   LABSPEC 1.041 (H) 04/09/2023 0016   LABSPEC 1.034 05/08/2013 0330   PHURINE 8.0 04/09/2023 0016   GLUCOSEU NEGATIVE 04/09/2023 0016   GLUCOSEU Negative 05/08/2013 0330   HGBUR NEGATIVE 04/09/2023 0016   BILIRUBINUR NEGATIVE 04/09/2023 0016   BILIRUBINUR 1+ 05/08/2013 0330   KETONESUR NEGATIVE 04/09/2023 0016   PROTEINUR 100 (A) 04/09/2023 0016   NITRITE NEGATIVE 04/09/2023 0016   LEUKOCYTESUR LARGE (A) 04/09/2023 0016   LEUKOCYTESUR Negative 05/08/2013 0330    Sepsis Labs: Lactic Acid, Venous    Component Value Date/Time   LATICACIDVEN 2.1 (HH) 04/08/2023 2219    MICROBIOLOGY: Recent Results (from the past 240 hour(s))  Culture, blood (Routine X 2) w Reflex to ID Panel     Status: Abnormal   Collection Time: 04/18/23  8:06 AM   Specimen: BLOOD RIGHT ARM  Result Value Ref Range Status   Specimen Description BLOOD RIGHT ARM  Final   Special Requests   Final    BOTTLES DRAWN AEROBIC AND ANAEROBIC Blood Culture adequate volume   Culture  Setup Time   Final    GRAM POSITIVE COCCI IN CHAINS ANAEROBIC BOTTLE ONLY CRITICAL VALUE NOTED.  VALUE IS CONSISTENT WITH PREVIOUSLY REPORTED AND CALLED VALUE.    Culture (A)  Final    PEPTOSTREPTOCOCCUS SPECIES BACTEROIDES STERCORIS BETA LACTAMASE POSITIVE CRITICAL RESULT CALLED TO, READ BACK BY AND VERIFIED WITH: Antoine Primas PHARMD, AT 1323 04/22/23 Renato Shin Performed at [facility], [address] [place], [place] [address]    Report Status 04/22/2023 FINAL  Final  Culture, blood (Routine X 2) w Reflex to ID Panel     Status: None   Collection Time: 04/18/23  8:10 AM   Specimen: BLOOD LEFT HAND  Result Value Ref Range Status   Specimen  Description BLOOD LEFT HAND  Final   Special Requests   Final    BOTTLES DRAWN AEROBIC ONLY Blood Culture adequate volume   Culture   Final    NO GROWTH 5 DAYS Performed at [facility], [address] [place], [place] [address]    Report Status 04/23/2023 FINAL  Final    RADIOLOGY STUDIES/RESULTS: No results found.   LOS: 14 days   Jeoffrey Massed, MD  [facility]    To contact the attending provider between 7A-7P or the covering provider during after hours 7P-7A, please log into the web site [email or web address] and access using universal [place] password for that web site. If you do not have the password, please call the hospital operator.  04/23/2023, 2:02 PM

## 2023-04-23 NOTE — Progress Notes (Signed)
OT Cancellation Note  Patient Details Name: Steve [name] MRN: [id number] DOB: 1990-12-02   Cancelled Treatment:    Reason Eval/Treat Not Completed: (P) Pain limiting ability to participate, Pt states 10/10 stomach pain/indigestion, significant grimacing and nausea observed, nursing informed.   Alexis Goodell 04/23/2023, 3:46 PM

## 2023-04-24 ENCOUNTER — Encounter (HOSPITAL_COMMUNITY): Payer: Self-pay | Admitting: Critical Care Medicine

## 2023-04-24 ENCOUNTER — Encounter (HOSPITAL_COMMUNITY)
Admission: EM | Discharge status: Against Medical Advice | Payer: Self-pay | Source: Home / Self Care | Attending: Internal Medicine

## 2023-04-24 ENCOUNTER — Other Ambulatory Visit: Payer: Self-pay

## 2023-04-24 ENCOUNTER — Inpatient Hospital Stay (HOSPITAL_COMMUNITY): Payer: No Typology Code available for payment source | Admitting: Anesthesiology

## 2023-04-24 DIAGNOSIS — E871 Hypo-osmolality and hyponatremia: Secondary | ICD-10-CM

## 2023-04-24 DIAGNOSIS — F418 Other specified anxiety disorders: Secondary | ICD-10-CM

## 2023-04-24 DIAGNOSIS — F1510 Other stimulant abuse, uncomplicated: Secondary | ICD-10-CM | POA: Diagnosis not present

## 2023-04-24 DIAGNOSIS — L02415 Cutaneous abscess of right lower limb: Secondary | ICD-10-CM

## 2023-04-24 DIAGNOSIS — R6521 Severe sepsis with septic shock: Secondary | ICD-10-CM | POA: Diagnosis not present

## 2023-04-24 DIAGNOSIS — A419 Sepsis, unspecified organism: Secondary | ICD-10-CM | POA: Diagnosis not present

## 2023-04-24 DIAGNOSIS — L02416 Cutaneous abscess of left lower limb: Secondary | ICD-10-CM

## 2023-04-24 DIAGNOSIS — M0009 Staphylococcal polyarthritis: Secondary | ICD-10-CM | POA: Diagnosis not present

## 2023-04-24 HISTORY — PX: I & D EXTREMITY: SHX5045

## 2023-04-24 LAB — AEROBIC/ANAEROBIC CULTURE W GRAM STAIN (SURGICAL/DEEP WOUND): Gram Stain: NONE SEEN

## 2023-04-24 LAB — BASIC METABOLIC PANEL
Anion gap: 9 (ref 5–15)
BUN: 7 mg/dL (ref 6–20)
CO2: 20 mmol/L — ABNORMAL LOW (ref 22–32)
Calcium: 6 mg/dL — CL (ref 8.9–10.3)
Chloride: 103 mmol/L (ref 98–111)
Creatinine, Ser: 0.54 mg/dL — ABNORMAL LOW (ref 0.61–1.24)
GFR, Estimated: >60 mL/min (ref >60)
Glucose, Bld: 100 mg/dL — ABNORMAL HIGH (ref 70–99)
Potassium: 3.5 mmol/L (ref 3.5–5.1)
Sodium: 132 mmol/L — ABNORMAL LOW (ref 135–145)

## 2023-04-24 LAB — PREPARE RBC (CROSSMATCH)

## 2023-04-24 LAB — TYPE AND SCREEN
ABO/RH(D): A POS
Antibody Screen: NEGATIVE

## 2023-04-24 LAB — SURGICAL PCR SCREEN
MRSA, PCR: NEGATIVE
Staphylococcus aureus: NEGATIVE

## 2023-04-24 SURGERY — IRRIGATION AND DEBRIDEMENT EXTREMITY
Anesthesia: General | Site: Hip | Laterality: Bilateral

## 2023-04-24 MED ORDER — EPHEDRINE 5 MG/ML INJ
INTRAVENOUS | Status: AC
  Filled 2023-04-24: qty 5

## 2023-04-24 MED ORDER — ONDANSETRON HCL 4 MG/2ML IJ SOLN
INTRAMUSCULAR | Status: DC | PRN
Start: 2023-04-24 — End: 2023-04-24
  Administered 2023-04-24: 4 mg via INTRAVENOUS

## 2023-04-24 MED ORDER — CALCIUM GLUCONATE-NACL 1-0.675 GM/50ML-% IV SOLN
1.0000 g | Freq: Once | INTRAVENOUS | Status: AC
  Administered 2023-04-24: 1000 mg via INTRAVENOUS
  Filled 2023-04-24: qty 50

## 2023-04-24 MED ORDER — SODIUM CHLORIDE 0.9 % IV SOLN: 10.0000 mL/h | Freq: Once | INTRAVENOUS | Status: DC

## 2023-04-24 MED ORDER — MIDAZOLAM HCL 2 MG/2ML IJ SOLN
INTRAMUSCULAR | Status: DC | PRN
  Administered 2023-04-24: 2 mg via INTRAVENOUS

## 2023-04-24 MED ORDER — VASHE WOUND IRRIGATION OPTIME
34.0000 [oz_av] | Freq: Once | TOPICAL | Status: DC
  Filled 2023-04-24: qty 34

## 2023-04-24 MED ORDER — ROCURONIUM BROMIDE 10 MG/ML (PF) SYRINGE
PREFILLED_SYRINGE | INTRAVENOUS | Status: DC | PRN
  Administered 2023-04-24: 80 mg via INTRAVENOUS

## 2023-04-24 MED ORDER — OXYCODONE HCL 5 MG PO TABS: 5.0000 mg | ORAL_TABLET | Freq: Once | ORAL | Status: DC | PRN

## 2023-04-24 MED ORDER — CHLORHEXIDINE GLUCONATE 0.12 % MT SOLN
OROMUCOSAL | Status: AC
  Administered 2023-04-24: 15 mL via OROMUCOSAL
  Filled 2023-04-24: qty 15

## 2023-04-24 MED ORDER — SUGAMMADEX SODIUM 200 MG/2ML IV SOLN
INTRAVENOUS | Status: DC | PRN
  Administered 2023-04-24: 200 mg via INTRAVENOUS

## 2023-04-24 MED ORDER — PROPOFOL 10 MG/ML IV BOLUS
INTRAVENOUS | Status: AC
  Filled 2023-04-24: qty 20

## 2023-04-24 MED ORDER — LIDOCAINE 2% (20 MG/ML) 5 ML SYRINGE
INTRAMUSCULAR | Status: AC
  Filled 2023-04-24: qty 5

## 2023-04-24 MED ORDER — ONDANSETRON HCL 4 MG/2ML IJ SOLN: 4.0000 mg | Freq: Four times a day (QID) | INTRAMUSCULAR | Status: DC | PRN

## 2023-04-24 MED ORDER — TRANEXAMIC ACID 1000 MG/10ML IV SOLN
2000.0000 mg | Freq: Once | INTRAVENOUS | Status: DC
  Filled 2023-04-24: qty 20

## 2023-04-24 MED ORDER — ROCURONIUM BROMIDE 10 MG/ML (PF) SYRINGE
PREFILLED_SYRINGE | INTRAVENOUS | Status: AC
  Filled 2023-04-24: qty 10

## 2023-04-24 MED ORDER — VASHE WOUND IRRIGATION OPTIME
TOPICAL | Status: DC | PRN
  Administered 2023-04-24 (×2): 34 [oz_av]

## 2023-04-24 MED ORDER — DOCUSATE SODIUM 100 MG PO CAPS
100.0000 mg | ORAL_CAPSULE | Freq: Two times a day (BID) | ORAL | Status: DC
  Administered 2023-04-24 – 2023-05-02 (×12): 100 mg via ORAL
  Filled 2023-04-24 (×15): qty 1

## 2023-04-24 MED ORDER — ONDANSETRON HCL 4 MG/2ML IJ SOLN: 4.0000 mg | Freq: Once | INTRAMUSCULAR | Status: DC | PRN

## 2023-04-24 MED ORDER — POVIDONE-IODINE 10 % EX SWAB
2.0000 | Freq: Once | CUTANEOUS | Status: AC
  Administered 2023-04-24: 2 via TOPICAL

## 2023-04-24 MED ORDER — FENTANYL CITRATE (PF) 250 MCG/5ML IJ SOLN
INTRAMUSCULAR | Status: AC
  Filled 2023-04-24: qty 5

## 2023-04-24 MED ORDER — DEXAMETHASONE SODIUM PHOSPHATE 10 MG/ML IJ SOLN
INTRAMUSCULAR | Status: DC | PRN
  Administered 2023-04-24: 10 mg via INTRAVENOUS

## 2023-04-24 MED ORDER — HYDROMORPHONE HCL 1 MG/ML IJ SOLN
0.2500 mg | INTRAMUSCULAR | Status: DC | PRN
  Administered 2023-04-24 (×2): 0.25 mg via INTRAVENOUS
  Administered 2023-04-24: 0.5 mg via INTRAVENOUS

## 2023-04-24 MED ORDER — CHLORHEXIDINE GLUCONATE 0.12 % MT SOLN: 15.0000 mL | Freq: Once | OROMUCOSAL | Status: AC

## 2023-04-24 MED ORDER — MAGNESIUM CITRATE PO SOLN: 1.0000 | Freq: Once | ORAL | Status: DC | PRN

## 2023-04-24 MED ORDER — AMISULPRIDE (ANTIEMETIC) 5 MG/2ML IV SOLN: 10.0000 mg | Freq: Once | INTRAVENOUS | Status: DC | PRN

## 2023-04-24 MED ORDER — KETAMINE HCL 50 MG/5ML IJ SOSY
PREFILLED_SYRINGE | INTRAMUSCULAR | Status: AC
  Filled 2023-04-24: qty 5

## 2023-04-24 MED ORDER — SODIUM CHLORIDE 0.9 % IV SOLN: INTRAVENOUS | Status: DC

## 2023-04-24 MED ORDER — ONDANSETRON HCL 4 MG/2ML IJ SOLN
INTRAMUSCULAR | Status: AC
  Filled 2023-04-24: qty 2

## 2023-04-24 MED ORDER — ACETAMINOPHEN 10 MG/ML IV SOLN
INTRAVENOUS | Status: AC
  Filled 2023-04-24: qty 100

## 2023-04-24 MED ORDER — CEFAZOLIN SODIUM-DEXTROSE 2-4 GM/100ML-% IV SOLN
INTRAVENOUS | Status: AC
  Filled 2023-04-24: qty 100

## 2023-04-24 MED ORDER — POVIDONE-IODINE 10 % EX SWAB: 2.0000 | Freq: Once | CUTANEOUS | Status: DC

## 2023-04-24 MED ORDER — HYDROMORPHONE HCL 1 MG/ML IJ SOLN
INTRAMUSCULAR | Status: AC
  Filled 2023-04-24: qty 1

## 2023-04-24 MED ORDER — METHOCARBAMOL 500 MG PO TABS
500.0000 mg | ORAL_TABLET | Freq: Four times a day (QID) | ORAL | Status: DC | PRN
  Administered 2023-04-26 – 2023-04-28 (×3): 500 mg via ORAL
  Filled 2023-04-24 (×3): qty 1

## 2023-04-24 MED ORDER — CHLORHEXIDINE GLUCONATE 4 % EX SOLN: 60.0000 mL | Freq: Once | CUTANEOUS | Status: DC

## 2023-04-24 MED ORDER — ONDANSETRON HCL 4 MG PO TABS: 4.0000 mg | ORAL_TABLET | Freq: Four times a day (QID) | ORAL | Status: DC | PRN

## 2023-04-24 MED ORDER — PHENYLEPHRINE 80 MCG/ML (10ML) SYRINGE FOR IV PUSH (FOR BLOOD PRESSURE SUPPORT)
PREFILLED_SYRINGE | INTRAVENOUS | Status: DC | PRN
  Administered 2023-04-24: 80 ug via INTRAVENOUS
  Administered 2023-04-24: 160 ug via INTRAVENOUS
  Administered 2023-04-24 (×2): 80 ug via INTRAVENOUS

## 2023-04-24 MED ORDER — METOCLOPRAMIDE HCL 10 MG PO TABS: 5.0000 mg | ORAL_TABLET | Freq: Three times a day (TID) | ORAL | Status: DC | PRN

## 2023-04-24 MED ORDER — FENTANYL CITRATE (PF) 250 MCG/5ML IJ SOLN
INTRAMUSCULAR | Status: DC | PRN
  Administered 2023-04-24: 100 ug via INTRAVENOUS

## 2023-04-24 MED ORDER — ORAL CARE MOUTH RINSE: 15.0000 mL | Freq: Once | OROMUCOSAL | Status: AC

## 2023-04-24 MED ORDER — BISACODYL 10 MG RE SUPP: 10.0000 mg | Freq: Every day | RECTAL | Status: DC | PRN

## 2023-04-24 MED ORDER — CEFAZOLIN SODIUM-DEXTROSE 2-4 GM/100ML-% IV SOLN: 2.0000 g | INTRAVENOUS | Status: DC

## 2023-04-24 MED ORDER — OXYCODONE HCL 5 MG/5ML PO SOLN: 5.0000 mg | Freq: Once | ORAL | Status: DC | PRN

## 2023-04-24 MED ORDER — SODIUM CHLORIDE 0.9 % IV SOLN
INTRAVENOUS | Status: DC | PRN
Start: 2023-04-24 — End: 2023-04-24

## 2023-04-24 MED ORDER — METOCLOPRAMIDE HCL 5 MG/ML IJ SOLN: 5.0000 mg | Freq: Three times a day (TID) | INTRAMUSCULAR | Status: DC | PRN

## 2023-04-24 MED ORDER — MIDAZOLAM HCL 2 MG/2ML IJ SOLN
INTRAMUSCULAR | Status: AC
  Filled 2023-04-24: qty 2

## 2023-04-24 MED ORDER — VANCOMYCIN HCL IN DEXTROSE 1-5 GM/200ML-% IV SOLN: 1000.0000 mg | INTRAVENOUS | Status: AC

## 2023-04-24 MED ORDER — METHOCARBAMOL 1000 MG/10ML IJ SOLN: 500.0000 mg | Freq: Four times a day (QID) | INTRAVENOUS | Status: DC | PRN

## 2023-04-24 MED ORDER — ACETAMINOPHEN 10 MG/ML IV SOLN
1000.0000 mg | Freq: Once | INTRAVENOUS | Status: DC | PRN
  Administered 2023-04-24: 1000 mg via INTRAVENOUS

## 2023-04-24 MED ORDER — PHENYLEPHRINE 80 MCG/ML (10ML) SYRINGE FOR IV PUSH (FOR BLOOD PRESSURE SUPPORT)
PREFILLED_SYRINGE | INTRAVENOUS | Status: AC
  Filled 2023-04-24: qty 10

## 2023-04-24 MED ORDER — VANCOMYCIN HCL IN DEXTROSE 1-5 GM/200ML-% IV SOLN
INTRAVENOUS | Status: AC
  Administered 2023-04-24: 1000 mg via INTRAVENOUS
  Filled 2023-04-24: qty 200

## 2023-04-24 MED ORDER — CEFAZOLIN SODIUM-DEXTROSE 2-4 GM/100ML-% IV SOLN
2.0000 g | INTRAVENOUS | Status: AC
  Administered 2023-04-24: 2 g via INTRAVENOUS

## 2023-04-24 MED ORDER — DEXAMETHASONE SODIUM PHOSPHATE 10 MG/ML IJ SOLN
INTRAMUSCULAR | Status: AC
  Filled 2023-04-24: qty 1

## 2023-04-24 MED ORDER — KETAMINE HCL 10 MG/ML IJ SOLN
INTRAMUSCULAR | Status: DC | PRN
Start: 2023-04-24 — End: 2023-04-24
  Administered 2023-04-24: 25 mg via INTRAVENOUS

## 2023-04-24 MED ORDER — LACTATED RINGERS IV SOLN: INTRAVENOUS | Status: DC

## 2023-04-24 MED ORDER — 0.9 % SODIUM CHLORIDE (POUR BTL) OPTIME
TOPICAL | Status: DC | PRN
  Administered 2023-04-24: 1000 mL

## 2023-04-24 MED ORDER — PROPOFOL 10 MG/ML IV BOLUS
INTRAVENOUS | Status: DC | PRN
Start: 2023-04-24 — End: 2023-04-24
  Administered 2023-04-24: 130 mg via INTRAVENOUS

## 2023-04-24 MED ORDER — POLYETHYLENE GLYCOL 3350 17 G PO PACK: 17.0000 g | PACK | Freq: Every day | ORAL | Status: DC | PRN

## 2023-04-24 SURGICAL SUPPLY — 40 items
BAG COUNTER SPONGE SURGICOUNT (BAG) IMPLANT
BAG SPNG CNTER NS LX DISP (BAG) ×1
BLADE SURG 21 STRL SS (BLADE) ×1 IMPLANT
BNDG CMPR 5X6 CHSV STRCH STRL (GAUZE/BANDAGES/DRESSINGS)
BNDG COHESIVE 6X5 TAN ST LF (GAUZE/BANDAGES/DRESSINGS) IMPLANT
BNDG GAUZE DERMACEA FLUFF 4 (GAUZE/BANDAGES/DRESSINGS) ×2 IMPLANT
BNDG GZE DERMACEA 4 6PLY (GAUZE/BANDAGES/DRESSINGS) ×2
CLEANSER WND VASHE 34 (WOUND CARE) IMPLANT
CNTNR URN SCR LID CUP LEK RST (MISCELLANEOUS) IMPLANT
CONT SPEC 4OZ STRL OR WHT (MISCELLANEOUS) ×2
COVER SURGICAL LIGHT HANDLE (MISCELLANEOUS) ×2 IMPLANT
DRAPE U-SHAPE 47X51 STRL (DRAPES) ×1 IMPLANT
DRSG ADAPTIC 3X8 NADH LF (GAUZE/BANDAGES/DRESSINGS) ×1 IMPLANT
DURAPREP 26ML APPLICATOR (WOUND CARE) ×1 IMPLANT
ELECT REM PT RETURN 9FT ADLT (ELECTROSURGICAL) ×1
ELECTRODE REM PT RTRN 9FT ADLT (ELECTROSURGICAL) IMPLANT
GAUZE PAD ABD 8X10 STRL (GAUZE/BANDAGES/DRESSINGS) IMPLANT
GAUZE SPONGE 4X4 12PLY STRL (GAUZE/BANDAGES/DRESSINGS) ×1 IMPLANT
GLOVE BIOGEL PI IND STRL 9 (GLOVE) ×1 IMPLANT
GLOVE SURG ORTHO 9.0 STRL STRW (GLOVE) ×1 IMPLANT
GOWN STRL REUS W/ TWL XL LVL3 (GOWN DISPOSABLE) ×2 IMPLANT
GOWN STRL REUS W/TWL XL LVL3 (GOWN DISPOSABLE) ×2
HANDPIECE INTERPULSE COAX TIP (DISPOSABLE)
KIT BASIN OR (CUSTOM PROCEDURE TRAY) ×1 IMPLANT
KIT TURNOVER KIT B (KITS) ×1 IMPLANT
MANIFOLD NEPTUNE II (INSTRUMENTS) ×1 IMPLANT
NS IRRIG 1000ML POUR BTL (IV SOLUTION) ×1 IMPLANT
PACK ORTHO EXTREMITY (CUSTOM PROCEDURE TRAY) ×1 IMPLANT
PAD ARMBOARD 7.5X6 YLW CONV (MISCELLANEOUS) ×2 IMPLANT
SET HNDPC FAN SPRY TIP SCT (DISPOSABLE) IMPLANT
SPONGE T-LAP 18X18 ~~LOC~~+RFID (SPONGE) IMPLANT
STOCKINETTE IMPERVIOUS 9X36 MD (GAUZE/BANDAGES/DRESSINGS) IMPLANT
STOCKINETTE IMPERVIOUS LG (DRAPES) IMPLANT
SUT ETHILON 2 0 PSLX (SUTURE) ×1 IMPLANT
SWAB COLLECTION DEVICE MRSA (MISCELLANEOUS) ×1 IMPLANT
SWAB CULTURE ESWAB REG 1ML (MISCELLANEOUS) IMPLANT
SYR BULB IRRIG 60ML STRL (SYRINGE) IMPLANT
TOWEL GREEN STERILE (TOWEL DISPOSABLE) ×1 IMPLANT
TUBE CONNECTING 12X1/4 (SUCTIONS) ×1 IMPLANT
YANKAUER SUCT BULB TIP NO VENT (SUCTIONS) ×1 IMPLANT

## 2023-04-24 NOTE — Anesthesia Procedure Notes (Addendum)
Procedure Name: Intubation Date/Time: 04/24/2023 11:24 AM  Performed by: Kayleen Memos, CRNAPre-anesthesia Checklist: Patient identified, Emergency Drugs available, Suction available and Patient being monitored Patient Re-evaluated:Patient Re-evaluated prior to induction Oxygen Delivery Method: Circle system utilized Preoxygenation: Pre-oxygenation with 100% oxygen Induction Type: IV induction Ventilation: Mask ventilation without difficulty Laryngoscope Size: Glidescope and 4 Grade View: Grade I Tube type: Oral Tube size: 7.5 mm Number of attempts: 1 Airway Equipment and Method: Rigid stylet and Video-laryngoscopy Placement Confirmation: ETT inserted through vocal cords under direct vision, positive ETCO2 and breath sounds checked- equal and bilateral Secured at: 22 cm Tube secured with: Tape Dental Injury: Teeth and Oropharynx as per pre-operative assessment  Difficulty Due To: Difficulty was anticipated and Difficult Airway- due to reduced neck mobility

## 2023-04-24 NOTE — Progress Notes (Signed)
OT Cancellation Note  Patient Details Name: Steve [name] MRN: [id number] DOB: 04-14-1991   Cancelled Treatment:    Reason Eval/Treat Not Completed: Patient at procedure or test/ unavailable (Pt currently off unit for B hip debridement. OT to reattempt to see pt for skilled OT eval at a later time as available/appropriate.)   "Ronaldo Miyamoto" M., OTR/L, MA Acute Rehab [phone]   Lendon Colonel 04/24/2023, 9:37 AM

## 2023-04-24 NOTE — Anesthesia Postprocedure Evaluation (Signed)
Anesthesia Post Note  Patient: RICCI WEINZAPFEL  Procedure(s) Performed: BILATERAL HIP DEBRIDEMENTS (Bilateral: Hip)     Patient location during evaluation: PACU Anesthesia Type: General Level of consciousness: awake and alert Pain management: pain level controlled Vital Signs Assessment: post-procedure vital signs reviewed and stable Respiratory status: spontaneous breathing, nonlabored ventilation, respiratory function stable and patient connected to nasal cannula oxygen Cardiovascular status: blood pressure returned to baseline and stable Postop Assessment: no apparent nausea or vomiting Anesthetic complications: yes   Encounter Notable Events  Notable Event Outcome Phase Comment  Difficult to intubate - expected  Intraprocedure Filed from anesthesia note documentation.    Last Vitals:  Vitals:   04/24/23 1330 04/24/23 1400  BP: 100/64 (!) 103/57  Pulse: 83 82  Resp: 13 (!) 27  Temp: 36.7 C   SpO2: 96% 95%    Last Pain:  Vitals:   04/24/23 1359  TempSrc:   PainSc: 0-No pain                 Trevor Iha

## 2023-04-24 NOTE — Transfer of Care (Signed)
Immediate Anesthesia Transfer of Care Note  Patient: Steve [name]  Procedure(s) Performed: BILATERAL HIP DEBRIDEMENTS (Bilateral: Hip)  Patient Location: PACU  Anesthesia Type:General  Level of Consciousness: sedated  Airway & Oxygen Therapy: Patient connected to face mask oxygen  Post-op Assessment: Report given to RN, Post -op Vital signs reviewed and stable, and Patient moving all extremities  Post vital signs: Reviewed and stable  Last Vitals:  Vitals Value Taken Time  BP 92/58 04/24/23 1230  Temp    Pulse 90 04/24/23 1234  Resp 24 04/24/23 1234  SpO2 97 % 04/24/23 1234  Vitals shown include unfiled device data.  Last Pain:  Vitals:   04/24/23 0918  TempSrc:   PainSc: 8       Patients Stated Pain Goal: 0 (04/13/23 2108)  Complications:  Encounter Notable Events  Notable Event Outcome Phase Comment  Difficult to intubate - expected  Intraprocedure Filed from anesthesia note documentation.

## 2023-04-24 NOTE — Op Note (Signed)
04/24/2023  12:24 PM  PATIENT:  Steve [name]    PRE-OPERATIVE DIAGNOSIS:  Abscess Bilateral Hips  POST-OPERATIVE DIAGNOSIS:  Same  PROCEDURE:  BILATERAL HIP EXCISIONAL DEBRIDEMENTS, with excision of skin and soft tissue muscle fascia and bone from the ischial tuberosities. Tissue sent for cultures separately for left and right hip wound.  SURGEON:  Nadara Mustard, MD  PHYSICIAN ASSISTANT:None ANESTHESIA:   General  PREOPERATIVE INDICATIONS:  Steve [name] is a  32 y.o. male with a diagnosis of Abscess Bilateral Hips who failed conservative measures and elected for surgical management.    The risks benefits and alternatives were discussed with the patient preoperatively including but not limited to the risks of infection, bleeding, nerve injury, cardiopulmonary complications, the need for revision surgery, among others, and the patient was willing to proceed.  OPERATIVE IMPLANTS:   * No implants in log *  @ENCIMAGES @  OPERATIVE FINDINGS: Patient had exposed bone in both wounds.  Purulent abscesses were decompressed.  Tissue was sent for cultures separately for both left and right hips.  OPERATIVE PROCEDURE: Patient was brought the operating room and underwent a general anesthetic.  After adequate levels anesthesia were obtained patient was placed prone in the operating room table all bony prominences were padded and the ischial tuberosity area bilaterally was prepped using DuraPrep draped into a sterile field with a shower curtain.  A timeout was called.  The left hip had a large area of necrotic muscle this was excised with a 21 blade knife.  This was excised back to healthy viable bleeding muscle.  Electrocautery was used for hemostasis.  The wound tunnel deep to the iliac.  There was purulent drainage the wound was decompressed.  The wound was irrigated with Vashe irrigant twice.  Kerlix was then soaked in Vashe and the wound was packed open with the Kerlix soaked in Vashe.   Attention was then focused on the right hip.  A 21 blade knife was used to excise skin and soft tissue muscle and fascia.  Electrocautery was used for hemostasis.  Bone was resected with a rondure from the ischial tuberosity and bone was sent for cultures with other soft tissue from the right hip.  The wound was irrigated twice with Vashe irrigation.  The wound was then packed open with Kerlix soaked Vashe.  Both wounds were then covered with 4 x 4's and Hypafix tape.  Patient was transferred supine extubated taken the PACU in stable condition.   DISCHARGE PLANNING:  Antibiotic duration: Continue antibiotics based on culture sensitivities.  Weightbearing: Turn side-to-side.  Pain medication: Continue current pain management  Dressing care/ Wound VAC: Orders written for Curlex soaked in Vashe to be changed every shift to both hip wounds.  Ambulatory devices: Not applicable  Discharge to: Based on therapy recommendations.  Follow-up: In the office 1 week post operative.

## 2023-04-24 NOTE — Interval H&P Note (Signed)
History and Physical Interval Note:  04/24/2023 6:45 AM  Steve [name]  has presented today for surgery, with the diagnosis of Abscess Bilateral Hips.  The various methods of treatment have been discussed with the patient and family. After consideration of risks, benefits and other options for treatment, the patient has consented to  Procedure(s): BILATERAL HIP DEBRIDEMENT (Bilateral) as a surgical intervention.  The patient's history has been reviewed, patient examined, no change in status, stable for surgery.  I have reviewed the patient's chart and labs.  Questions were answered to the patient's satisfaction.     Nadara Mustard

## 2023-04-24 NOTE — Progress Notes (Signed)
PROGRESS NOTE        PATIENT DETAILS Name: Steve [name] Age: 32 y.o. Sex: male Date of Birth: Jul 04, 1991 Admit Date: 04/08/2023 Admitting Physician Briant Sites, DO WJX:[facility]WG, [facility]  Brief Summary: 32 year old male with with history of paraplegia-known sacral decubitus ulcers with chronic osteomyelitis, substance abuse, chronic hypotension on midodrine-presented with septic shock in the setting of polymicrobial bacteremia and acute on chronic osteomyelitis of sacral decubitus ulcers and left hip septic arthritis.  Initially admitted to the ICU-stabilized and subsequently transferred to [facility].  Evaluated by orthopedics-no surgical interventions recommended.  Evaluated by ID-with plans for 6 weeks of IV daptomycin/Rocephin.  Unfortunately-further hospital course complicated by intermittent fevers-upon further evaluation with MRI pelvis-he was found to have abscess.  See below for further details.     Social worker following for placement-as patient is not a candidate for outpatient IV antibiotics (history of drug use)Patient is a 32 y.o.  male    Significant studies: 7/22>> CT abdomen/pelvis: Septic arthritis left hip with osteomyelitis of left ischium/inferior pubic rami.  Posterior left hip dislocation. 7/23>> TTE: EF 60-65% 8/02>> CT pelvis: Unchanged left hip dislocation-chronic osteomyelitis left pelvis related to ischemic ulcer, possible ill-defined fluid collection adjacent to the ulcer. 8/02>> bilateral lower extremity Doppler: No DVT 8/03>> MRI pelvis: Rim enhancing collection posterior right proximal femur concerning for abscess, multiple small abscesses around proximal left femur/extubate him.  Significant microbiology data: 7/22>> blood culture: Staph aureus, group C streptococcus, Proteus 7/23>> blood culture: Proteus/E. Coli 7/24>> blood culture: No growth 8/01>> blood culture: Peptostreptococcus, Bacteroides 8/01>> blood culture: No  growth  Procedures: 7/29>> PICC by IR  Consults: PCCM ID Ortho  Subjective: No major issues overnight.  Objective: Vitals: Blood pressure 100/64, pulse 83, temperature 98 F (36.7 C), resp. rate 13, height 5\' 11"  (1.803 m), weight 72.1 kg, SpO2 96%.   Exam: Gen Exam:Alert awake-not in any distress HEENT:atraumatic, normocephalic Chest: B/L clear to auscultation anteriorly CVS:S1S2 regular Abdomen:soft non tender, non distended Extremities:no edema Neurology: Paraplegic Skin: no rash  Pertinent Labs/Radiology:    Latest Ref Rng & Units 04/23/2023    4:08 AM 04/22/2023    5:32 AM 04/21/2023   10:38 PM  CBC  WBC 4.0 - 10.5 K/uL 7.3  8.4    Hemoglobin 13.0 - 17.0 g/dL 7.2  7.3  7.8   Hematocrit 39.0 - 52.0 % 22.9  23.1  25.1   Platelets 150 - 400 K/uL 220  237      Lab Results  Component Value Date   NA 126 (L) 04/23/2023   K 4.2 04/23/2023   CL 96 (L) 04/23/2023   CO2 23 04/23/2023      Assessment/Plan: Septic shock-POA Acute on chronic osteomyelitis septic arthritis of the left hip  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 Lajoyce Corners planning surgical debridement of pelvic abscess/sacral wounds 8/7 ID following Daptomycin/Rocephin. Per ID-TEE not needed as patient will be on a prolonged course of antibiotics.  Note-not a candidate for home IV infusion therapies-Per social work-[facility]and hence will remain inpatient here at [facility] to complete course of IV antibiotics.   Acute on chronic normocytic anemia  Secondary to acute illness-superimposed on anemia of  chronic disease Continue to follow CBC-last transfusion on 7/29.   Hypotension BP soft but stable on midodrine.   Hyponatremia Due to excessive fluid intake Fluid restrict Salt  tablets Low-dose Lasix starting 8/6 Follow electrolytes Continues to be asymptomatic-sodium levels continue to fluctuate in the high 120s.   Normocytic anemia Due to acute illness/chronic inflammation from sacral osteomyelitis/ulcers Continue to Hb-required PRBC transfusion on 8/4.   GERD PPI   Vitamin D deficiency continue with supplements   Vitamin B12 deficiency Continue supplementation   Vitamin A, C, D deficiency, zinc deficiency Continue with supplements   Neurogenic bladder/UTI Paraplegia Continue Foley, urine culture growing Proteus, continue with Rocephin   Schizophrenia/PTSD Seems stable Continue Abilify   Substance abuse  Self reports use of methamphetamines    3 mm left solid pulmonary nodule Further workup deferred to outpatient setting-as this is an incidental finding  Nutrition Status: Nutrition Problem: Inadequate oral intake Etiology: decreased appetite Signs/Symptoms: per patient/family report Interventions: Refer to RD note for recommendations    Pressure Ulcer: Pressure Injury 05/30/21 Ankle Left;Lateral Unstageable - Full thickness tissue loss in which the base of the injury is covered by slough (yellow, tan, gray, green or brown) and/or eschar (tan, brown or black) in the wound bed. (Active)  05/30/21 1938  Location: Ankle  Location Orientation: Left;Lateral  Staging: Unstageable - Full thickness tissue loss in which the base of the injury is covered by slough (yellow, tan, gray, green or brown) and/or eschar (tan, brown or black) in the wound bed.  Wound Description (Comments):   Present on Admission: Yes  Dressing Type Foam - Lift dressing to assess site every shift 04/23/23 0730     Pressure Injury 08/29/21 Ischial tuberosity Left Stage 4 - Full thickness tissue loss with exposed bone, tendon or muscle. [id number] (Active)  08/29/21 0100  Location: Ischial tuberosity  Location Orientation: Left  Staging: Stage 4 - Full thickness tissue loss  with exposed bone, tendon or muscle.  Wound Description (Comments): [id number]  Present on Admission: Yes  Dressing Type Foam - Lift dressing to assess site every shift 04/23/23 0730     Pressure Injury 04/09/23 Pretibial Right;Lateral Stage 2 -  Partial thickness loss of dermis presenting as a shallow open injury with a red, pink wound bed without slough. full thickness, NOT a pressure injury (Active)  04/09/23 0351  Location: Pretibial  Location Orientation: Right;Lateral  Staging: Stage 2 -  Partial thickness loss of dermis presenting as a shallow open injury with a red, pink wound bed without slough.  Wound Description (Comments): full thickness, NOT a pressure injury  Present on Admission: Yes  Dressing Type Foam - Lift dressing to assess site every shift 04/24/23 0744     Pressure Injury 04/09/23 Ischial tuberosity Right Stage 4 - Full thickness tissue loss with exposed bone, tendon or muscle. (Active)  04/09/23 0100  Location: Ischial tuberosity  Location Orientation: Right  Staging: Stage 4 - Full thickness tissue loss with exposed bone, tendon or muscle.  Wound Description (Comments):   Present on Admission: Yes  Dressing Type Foam - Lift dressing to assess site every shift 04/23/23 0730     Pressure Injury 04/09/23 Elbow Left;Posterior Stage 2 -  Partial thickness loss of dermis presenting as a shallow open injury with a red, pink wound bed without slough. (Active)  04/09/23 0330  Location: Elbow  Location Orientation: Left;Posterior  Staging: Stage 2 -  Partial thickness loss of dermis presenting as a shallow open injury with a  red, pink wound bed without slough.  Wound Description (Comments):   Present on Admission: Yes  Dressing Type Foam - Lift dressing to assess site every shift 04/23/23 0730    BMI : Estimated body mass index is 22.17 kg/m as calculated from the following:   Height as of this encounter: 5\' 11"  (1.803 m).   Weight as of this encounter: 72.1 kg.   Code  status:   Code Status: Full Code   DVT Prophylaxis: enoxaparin (LOVENOX) injection 40 mg Start: 04/16/23 2200 SCDs Start: 04/09/23 0140   Family Communication: None at bedside   Disposition Plan: Status is: Inpatient Remains inpatient appropriate because: Severity of illness   Planned Discharge Destination:Home if willing but will remain inpatient to complete approximately 6 weeks of IV antibiotics-as patient not a candidate for outpatient therapies.   Diet: Diet Order             Diet NPO time specified  Diet effective now                     Antimicrobial agents: Anti-infectives (From admission, onward)    Start     Dose/Rate Route Frequency Ordered Stop   04/24/23 1115  ceFAZolin (ANCEF) IVPB 2g/100 mL premix        2 g 200 mL/hr over 30 Minutes Intravenous On call to O.R. 04/24/23 1105 04/25/23 0559   04/24/23 0915  ceFAZolin (ANCEF) IVPB 2g/100 mL premix        2 g 200 mL/hr over 30 Minutes Intravenous On call to O.R. 04/24/23 0914 04/24/23 1203   04/24/23 0915  vancomycin (VANCOCIN) IVPB 1000 mg/200 mL premix        1,000 mg 200 mL/hr over 60 Minutes Intravenous On call to O.R. 04/24/23 0914 04/24/23 1031   04/24/23 0858  ceFAZolin (ANCEF) 2-4 GM/100ML-% IVPB       Note to Pharmacy: Jamelle Rushing, GRETA: cabinet override      04/24/23 0858 04/24/23 1140   04/22/23 1415  [MAR Hold]  metroNIDAZOLE (FLAGYL) tablet 500 mg        (MAR Hold since Wed 04/24/2023 at 0911.Hold Reason: Transfer to a Procedural area)   500 mg Oral Every 12 hours 04/22/23 1326     04/11/23 1400  vancomycin (VANCOCIN) IVPB 1000 mg/200 mL premix  Status:  Discontinued        1,000 mg 200 mL/hr over 60 Minutes Intravenous Every 8 hours 04/11/23 0828 04/11/23 0942   04/11/23 1100  [MAR Hold]  cefTRIAXone (ROCEPHIN) 2 g in sodium chloride 0.9 % 100 mL IVPB        (MAR Hold since Wed 04/24/2023 at 0911.Hold Reason: Transfer to a Procedural area)   2 g 200 mL/hr over 30 Minutes Intravenous Every 24  hours 04/11/23 0942     04/11/23 1030  [MAR Hold]  DAPTOmycin (CUBICIN) 600 mg in sodium chloride 0.9 % IVPB        (MAR Hold since Wed 04/24/2023 at 0911.Hold Reason: Transfer to a Procedural area)   8 mg/kg  76 kg 124 mL/hr over 30 Minutes Intravenous Daily 04/11/23 0942     04/10/23 1200  cefTRIAXone (ROCEPHIN) 2 g in sodium chloride 0.9 % 100 mL IVPB  Status:  Discontinued        2 g 200 mL/hr over 30 Minutes Intravenous Every 24 hours 04/10/23 1101 04/10/23 1728   04/09/23 1800  vancomycin (VANCOCIN) IVPB 1000 mg/200 mL premix  Status:  Discontinued  1,000 mg 200 mL/hr over 60 Minutes Intravenous Every 12 hours 04/09/23 1130 04/11/23 0828   04/09/23 0500  vancomycin (VANCOCIN) IVPB 1000 mg/200 mL premix  Status:  Discontinued        1,000 mg 200 mL/hr over 60 Minutes Intravenous Every 8 hours 04/09/23 0211 04/09/23 1130   04/09/23 0500  piperacillin-tazobactam (ZOSYN) IVPB 3.375 g  Status:  Discontinued        3.375 g 12.5 mL/hr over 240 Minutes Intravenous Every 8 hours 04/09/23 0211 04/10/23 0825   04/08/23 2015  vancomycin (VANCOREADY) IVPB 1500 mg/300 mL        1,500 mg 150 mL/hr over 120 Minutes Intravenous  Once 04/08/23 2001 04/08/23 2323   04/08/23 2000  piperacillin-tazobactam (ZOSYN) IVPB 3.375 g        3.375 g 100 mL/hr over 30 Minutes Intravenous  Once 04/08/23 1949 04/08/23 2044        MEDICATIONS: Scheduled Meds:  [MAR Hold] ARIPiprazole  15 mg Oral Daily   [MAR Hold] ascorbic acid  500 mg Oral Daily   chlorhexidine  60 mL Topical Once   chlorhexidine  60 mL Topical Once   [MAR Hold] Chlorhexidine Gluconate Cloth  6 each Topical Daily   [MAR Hold] cholecalciferol  1,000 Units Oral Daily   [MAR Hold] cyanocobalamin  1,000 mcg Subcutaneous Q30 days   [MAR Hold] enoxaparin (LOVENOX) injection  40 mg Subcutaneous Q24H   [MAR Hold] feeding supplement  237 mL Oral TID BM   [MAR Hold] furosemide  20 mg Oral Daily   HYDROmorphone       [MAR Hold] leptospermum  manuka honey  1 Application Topical Daily   [MAR Hold] lidocaine (PF)  10 mL Infiltration Once   [MAR Hold] metroNIDAZOLE  500 mg Oral Q12H   [MAR Hold] midodrine  10 mg Oral Q8H   [MAR Hold] multivitamin with minerals  1 tablet Oral Daily   [MAR Hold] nutrition supplement (JUVEN)  1 packet Oral BID BM   [MAR Hold] nystatin   Topical TID   [MAR Hold] pantoprazole  40 mg Oral Daily   povidone-iodine  2 Application Topical Once   [MAR Hold] sodium chloride  1 g Oral BID WC   tranexamic acid (CYKLOKAPRON) 2,000 mg in sodium chloride 0.9 % 50 mL Topical Application  2,000 mg Topical Once   [MAR Hold] vitamin A  10,000 Units Oral Daily   [MAR Hold] vitamin E  400 Units Oral Daily   zinc sulfate  220 mg Oral Daily   Continuous Infusions:  sodium chloride     sodium chloride     acetaminophen     acetaminophen Stopped (04/24/23 1331)    ceFAZolin (ANCEF) IV     [MAR Hold] cefTRIAXone (ROCEPHIN)  IV 2 g (04/23/23 1307)   [MAR Hold] DAPTOmycin (CUBICIN) 600 mg in sodium chloride 0.9 % IVPB 600 mg (04/23/23 1401)   lactated ringers 10 mL/hr at 04/24/23 1114   PRN Meds:.acetaminophen, acetaminophen, [MAR Hold] acetaminophen, [MAR Hold] alum & mag hydroxide-simeth, amisulpride, [MAR Hold] baclofen, [MAR Hold] calcium carbonate, [MAR Hold] docusate sodium, HYDROmorphone, HYDROmorphone (DILAUDID) injection, [MAR Hold]  morphine injection, [MAR Hold] ondansetron (ZOFRAN) IV, ondansetron (ZOFRAN) IV, [MAR Hold] mouth rinse, [MAR Hold] oxyCODONE, oxyCODONE **OR** oxyCODONE, [MAR Hold] polyethylene glycol   I have personally reviewed following labs and imaging studies  LABORATORY DATA: CBC: Recent Labs  Lab 04/19/23 0557 04/20/23 1208 04/21/23 0837 04/21/23 2238 04/22/23 0532 04/23/23 0408  WBC 6.7 5.4 6.5  --  8.4 7.3  NEUTROABS 5.3 4.1  --   --   --   --   HGB 7.9* 7.1* 6.7* 7.8* 7.3* 7.2*  HCT 25.6* 22.8* 21.9* 25.1* 23.1* 22.9*  MCV 88.9 89.8 92.0  --  90.2 90.9  PLT PLATELET CLUMPS  NOTED ON SMEAR, UNABLE TO ESTIMATE 216 230  --  237 220    Basic Metabolic Panel: Recent Labs  Lab 04/19/23 0557 04/20/23 1208 04/21/23 0837 04/22/23 0532 04/23/23 0408  NA 127* 130* 128* 126* 126*  K 4.0 4.1 3.5 4.0 4.2  CL 95* 94* 96* 95* 96*  CO2 20* 26 23 24 23   GLUCOSE 87 99 122* 94 92  BUN 7 7 8 8 8   CREATININE 0.49* 0.70 0.64 0.60* 0.51*  CALCIUM 7.4* 7.4* 7.2* 7.1* 7.1*    GFR: Estimated Creatinine Clearance: 133.9 mL/min (A) (by C-G formula based on SCr of 0.51 mg/dL (L)).  Liver Function Tests: Recent Labs  Lab 04/19/23 0557  AST 22  ALT 17  ALKPHOS 127*  BILITOT 0.2*  PROT 6.6  ALBUMIN <1.5*   No results for input(s): "LIPASE", "AMYLASE" in the last 168 hours. No results for input(s): "AMMONIA" in the last 168 hours.  Coagulation Profile: No results for input(s): "INR", "PROTIME" in the last 168 hours.  Cardiac Enzymes: Recent Labs  Lab 04/19/23 0557  CKTOTAL 31*    BNP (last 3 results) No results for input(s): "PROBNP" in the last 8760 hours.  Lipid Profile: No results for input(s): "CHOL", "HDL", "LDLCALC", "TRIG", "CHOLHDL", "LDLDIRECT" in the last 72 hours.  Thyroid Function Tests: No results for input(s): "TSH", "T4TOTAL", "FREET4", "T3FREE", "THYROIDAB" in the last 72 hours.  Anemia Panel: No results for input(s): "VITAMINB12", "FOLATE", "FERRITIN", "TIBC", "IRON", "RETICCTPCT" in the last 72 hours.  Urine analysis:    Component Value Date/Time   COLORURINE AMBER (A) 04/09/2023 0016   APPEARANCEUR TURBID (A) 04/09/2023 0016   APPEARANCEUR Hazy 05/08/2013 0330   LABSPEC 1.041 (H) 04/09/2023 0016   LABSPEC 1.034 05/08/2013 0330   PHURINE 8.0 04/09/2023 0016   GLUCOSEU NEGATIVE 04/09/2023 0016   GLUCOSEU Negative 05/08/2013 0330   HGBUR NEGATIVE 04/09/2023 0016   BILIRUBINUR NEGATIVE 04/09/2023 0016   BILIRUBINUR 1+ 05/08/2013 0330   KETONESUR NEGATIVE 04/09/2023 0016   PROTEINUR 100 (A) 04/09/2023 0016   NITRITE NEGATIVE  04/09/2023 0016   LEUKOCYTESUR LARGE (A) 04/09/2023 0016   LEUKOCYTESUR Negative 05/08/2013 0330    Sepsis Labs: Lactic Acid, Venous    Component Value Date/Time   LATICACIDVEN 2.1 (HH) 04/08/2023 2219    MICROBIOLOGY: Recent Results (from the past 240 hour(s))  Culture, blood (Routine X 2) w Reflex to ID Panel     Status: Abnormal   Collection Time: 04/18/23  8:06 AM   Specimen: BLOOD RIGHT ARM  Result Value Ref Range Status   Specimen Description BLOOD RIGHT ARM  Final   Special Requests   Final    BOTTLES DRAWN AEROBIC AND ANAEROBIC Blood Culture adequate volume   Culture  Setup Time   Final    GRAM POSITIVE COCCI IN CHAINS ANAEROBIC BOTTLE ONLY CRITICAL VALUE NOTED.  VALUE IS CONSISTENT WITH PREVIOUSLY REPORTED AND CALLED VALUE.    Culture (A)  Final    PEPTOSTREPTOCOCCUS SPECIES BACTEROIDES STERCORIS BETA LACTAMASE POSITIVE CRITICAL RESULT CALLED TO, READ BACK BY AND VERIFIED WITH: Antoine Primas PHARMD, AT 1323 04/22/23 Renato Shin Performed at [facility], [address], [place], [place] [address]    Report Status  04/22/2023 FINAL  Final  Culture, blood (Routine X 2) w Reflex to ID Panel     Status: None   Collection Time: 04/18/23  8:10 AM   Specimen: BLOOD LEFT HAND  Result Value Ref Range Status   Specimen Description BLOOD LEFT HAND  Final   Special Requests   Final    BOTTLES DRAWN AEROBIC ONLY Blood Culture adequate volume   Culture   Final    NO GROWTH 5 DAYS Performed at [facility], [address] [place], [place] [address]    Report Status 04/23/2023 FINAL  Final  Surgical pcr screen     Status: None   Collection Time: 04/24/23  9:15 AM   Specimen: Nasal Mucosa; Nasal Swab  Result Value Ref Range Status   MRSA, PCR NEGATIVE NEGATIVE Final   Staphylococcus aureus NEGATIVE NEGATIVE Final    Comment: (NOTE) The Xpert SA Assay (FDA approved for NASAL specimens in patients 73 years of age and older), is one component of a  comprehensive surveillance program. It is not intended to diagnose infection nor to guide or monitor treatment. Performed at [facility], [address], [place], [place] [address]     RADIOLOGY STUDIES/RESULTS: No results found.   LOS: 15 days   Jeoffrey Massed, MD  [facility]    To contact the attending provider between 7A-7P or the covering provider during after hours 7P-7A, please log into the web site [email or web address] and access using universal [place] password for that web site. If you do not have the password, please call the hospital operator.  04/24/2023, 1:34 PM

## 2023-04-25 ENCOUNTER — Encounter (HOSPITAL_COMMUNITY): Payer: Self-pay | Admitting: Orthopedic Surgery

## 2023-04-25 DIAGNOSIS — M0009 Staphylococcal polyarthritis: Secondary | ICD-10-CM | POA: Diagnosis not present

## 2023-04-25 DIAGNOSIS — R7881 Bacteremia: Secondary | ICD-10-CM | POA: Diagnosis not present

## 2023-04-25 DIAGNOSIS — R6521 Severe sepsis with septic shock: Secondary | ICD-10-CM | POA: Diagnosis not present

## 2023-04-25 DIAGNOSIS — A419 Sepsis, unspecified organism: Secondary | ICD-10-CM | POA: Diagnosis not present

## 2023-04-25 DIAGNOSIS — F1510 Other stimulant abuse, uncomplicated: Secondary | ICD-10-CM | POA: Diagnosis not present

## 2023-04-25 DIAGNOSIS — R8271 Bacteriuria: Secondary | ICD-10-CM | POA: Diagnosis not present

## 2023-04-25 DIAGNOSIS — B9689 Other specified bacterial agents as the cause of diseases classified elsewhere: Secondary | ICD-10-CM | POA: Diagnosis not present

## 2023-04-25 LAB — GLUCOSE, CAPILLARY: Glucose-Capillary: 108 mg/dL — ABNORMAL HIGH (ref 70–99)

## 2023-04-25 NOTE — Progress Notes (Signed)
PT Cancellation Note  Patient Details Name: Steve [name] MRN: [id number] DOB: 10-28-1990   Cancelled Treatment:    Reason Eval/Treat Not Completed: Fatigue/lethargy limiting ability to participate (Pt hypothermic, temp of 95 degrees and feel unwell and weak. RN order bear hugger and waiting for it to arrive to unit. Will follow up at later date/time as schedule allows/pt able.)    Renaldo Fiddler PT, DPT Acute Rehabilitation Services Office [phone]  04/25/23 1:24 PM

## 2023-04-25 NOTE — Progress Notes (Signed)
Patient ID: Steve [name], male   DOB: 25-Sep-1990, 33 y.o.   MRN: [id number] Patient is status post debridement of bilateral hip ulcers that extended down to the pelvis and ischial tuberosity bilaterally.  Necrotic bone and necrotic tissue was sent for cultures.  Cultures are showing gram-negative rods and gram-positive cocci from both wounds.  Orders placed for patient's wound to be packed open with Kerlix soaked in Vashe every shift.  Anticipate patient could be discharged with daily Kerlix dressing changes with using the same Vashe solution.

## 2023-04-25 NOTE — Progress Notes (Addendum)
PROGRESS NOTE        PATIENT DETAILS Name: Steve [name] Age: 32 y.o. Sex: male Date of Birth: [birth year] Admit Date: 04/08/2023 Admitting Physician Briant Sites, DO GEX:BMWUXL, [facility]  Brief Summary: 32 year old male with with history of paraplegia-known sacral decubitus ulcers with chronic osteomyelitis, substance abuse, chronic hypotension on midodrine-presented with septic shock in the setting of polymicrobial bacteremia and acute on chronic osteomyelitis of sacral decubitus ulcers and left hip septic arthritis.  Initially admitted to the ICU-stabilized and subsequently transferred to [facility].  Evaluated by orthopedics-no surgical interventions recommended.  Evaluated by ID-with plans for 6 weeks of IV daptomycin/Rocephin.  Unfortunately-further hospital course complicated by intermittent fevers-upon further evaluation with MRI pelvis-he was found to have abscess.  See below for further details.     Social worker following for placement-as patient is not a candidate for outpatient IV antibiotics (history of drug use)Patient is a 32 y.o.  male    Significant studies: 7/22>> CT abdomen/pelvis: Septic arthritis left hip with osteomyelitis of left ischium/inferior pubic rami.  Posterior left hip dislocation. 7/23>> TTE: EF 60-65% 8/02>> CT pelvis: Unchanged left hip dislocation-chronic osteomyelitis left pelvis related to ischemic ulcer, possible ill-defined fluid collection adjacent to the ulcer. 8/02>> bilateral lower extremity Doppler: No DVT 8/03>> MRI pelvis: Rim enhancing collection posterior right proximal femur concerning for abscess, multiple small abscesses around proximal left femur  Significant microbiology data: 7/22>> blood culture: Staph aureus, group C streptococcus, Proteus 7/23>> blood culture: Proteus/E. Coli 7/24>> blood culture: No growth 8/01>> blood culture: Peptostreptococcus, Bacteroides 8/01>> blood culture: No  growth 8/07>> intraoperative tissue culture right hip: Proteus Mirabella's 8/07>> intraoperative tissue culture left hip: Proteus mirabilis  Procedures: 7/29>> PICC by IR 8/07>> bilateral hip excisional debridement  Consults: PCCM ID Ortho  Subjective: No major issues overnight-like for abdomen bed-no chest pain or shortness of breath.  Objective: Vitals: Blood pressure (!) 100/58, pulse 60, temperature 97.9 F (36.6 C), temperature source Oral, resp. rate 16, height 5\' 11"  (1.803 m), weight 72.1 kg, SpO2 97%.   Exam: Gen Exam:Alert awake-not in any distress HEENT:atraumatic, normocephalic Chest: B/L clear to auscultation anteriorly CVS:S1S2 regular Abdomen:soft non tender, non distended Extremities:no edema Neurology: Paraplegic Skin: no rash  Pertinent Labs/Radiology:    Latest Ref Rng & Units 04/25/2023    4:09 AM 04/23/2023    4:08 AM 04/22/2023    5:32 AM  CBC  WBC 4.0 - 10.5 K/uL 13.8  7.3  8.4   Hemoglobin 13.0 - 17.0 g/dL 9.0  7.2  7.3   Hematocrit 39.0 - 52.0 % 28.9  22.9  23.1   Platelets 150 - 400 K/uL 306  220  237     Lab Results  Component Value Date   NA 128 (L) 04/25/2023   K 4.3 04/25/2023   CL 96 (L) 04/25/2023   CO2 23 04/25/2023      Assessment/Plan: Septic shock-POA Acute on chronic osteomyelitis septic arthritis of the left hip  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  Note-not a candidate for home  IV infusion therapies-Per social work-[facility]and hence will remain inpatient [facility] at [facility] to  complete course of IV antibiotics.   Acute on chronic normocytic anemia  Secondary to acute illness-superimposed on anemia of chronic disease Continue to follow CBC-last transfusion on 7/29.   Hypotension Am cortisol on 8/2 was > 20 BP soft but stable on midodrine.   Hyponatremia Due to excessive fluid intake Fluid restrict Salt tablets Low-dose Lasix starting 8/6 Follow electrolytes-sodium levels fluctuate quite a bit   Normocytic anemia Due to acute illness/chronic inflammation from sacral osteomyelitis/ulcers Continue to Hb-required PRBC transfusion on 8/4.   GERD PPI   Vitamin D deficiency continue with supplements   Vitamin B12 deficiency Continue supplementation   Vitamin A, C, D deficiency, zinc deficiency Continue with supplements   Neurogenic bladder/UTI Paraplegia Continue Foley, urine culture growing Proteus, continue with Rocephin   Schizophrenia/PTSD Seems stable Continue Abilify   Substance abuse  Self reports use of methamphetamines    3 mm left solid pulmonary nodule Further workup deferred to outpatient setting-as this is an incidental finding  Nutrition Status: Nutrition Problem: Inadequate oral intake Etiology: decreased appetite Signs/Symptoms: per patient/family report Interventions: Refer to RD note for recommendations    Pressure Ulcer: Pressure Injury 05/30/21 Ankle Left;Lateral Unstageable - Full thickness tissue loss in which the base of the injury is covered by slough (yellow, tan, gray, green or brown) and/or eschar (tan, brown or black) in the wound bed. (Active)  05/30/21 1938  Location: Ankle  Location Orientation: Left;Lateral  Staging: Unstageable - Full thickness tissue loss in which the base of the injury is covered by slough (yellow, tan, gray, green or brown) and/or eschar (tan, brown or black) in the wound bed.  Wound Description (Comments):   Present on Admission: Yes  Dressing Type Foam - Lift dressing to assess  site every shift 04/23/23 0730     Pressure Injury 08/29/21 Ischial tuberosity Left Stage 4 - Full thickness tissue loss with exposed bone, tendon or muscle. [id number] (Active)  08/29/21 0100  Location: Ischial tuberosity  Location Orientation: Left  Staging: Stage 4 - Full thickness tissue loss with exposed bone, tendon or muscle.  Wound Description (Comments): [id number]  Present on Admission: Yes  Dressing Type Foam - Lift dressing to assess site every shift 04/23/23 0730     Pressure Injury 04/09/23 Pretibial Right;Lateral Stage 2 -  Partial thickness loss of dermis presenting as a shallow open injury with a red, pink wound bed without slough. full thickness, NOT a pressure injury (Active)  04/09/23 0351  Location: Pretibial  Location Orientation: Right;Lateral  Staging: Stage 2 -  Partial thickness loss of dermis presenting as a shallow open injury with a red, pink wound bed without slough.  Wound Description (Comments): full thickness, NOT a pressure injury  Present on Admission: Yes  Dressing Type Foam - Lift dressing to assess site every shift 04/24/23 0744     Pressure Injury 04/09/23 Ischial tuberosity Right Stage 4 - Full thickness tissue loss with exposed bone, tendon or muscle. (Active)  04/09/23 0100  Location: Ischial tuberosity  Location Orientation: Right  Staging: Stage 4 - Full thickness tissue loss with exposed bone, tendon or muscle.  Wound Description (Comments):   Present on Admission: Yes  Dressing Type Foam - Lift dressing to assess site every shift 04/23/23 0730     Pressure Injury 04/09/23 Elbow Left;Posterior Stage 2 -  Partial thickness loss of dermis presenting as a  shallow open injury with a red, pink wound bed without slough. (Active)  04/09/23 0330  Location: Elbow  Location Orientation: Left;Posterior  Staging: Stage 2 -  Partial thickness loss of dermis presenting as a shallow open injury with a red, pink wound bed without slough.  Wound Description  (Comments):   Present on Admission: Yes  Dressing Type Foam - Lift dressing to assess site every shift 04/23/23 0730    BMI : Estimated body mass index is 22.17 kg/m as calculated from the following:   Height as of this encounter: 5\' 11"  (1.803 m).   Weight as of this encounter: 72.1 kg.   Code status:   Code Status: Full Code   DVT Prophylaxis: SCDs Start: 04/24/23 1718 enoxaparin (LOVENOX) injection 40 mg Start: 04/16/23 2200 SCDs Start: 04/09/23 0140   Family Communication: None at bedside   Disposition Plan: Status is: Inpatient Remains inpatient appropriate because: Severity of illness   Planned Discharge Destination:Home if willing but will remain inpatient to complete approximately 6 weeks of IV antibiotics-as patient not a candidate for outpatient therapies.   Diet: Diet Order             Diet regular Room service appropriate? Yes; Fluid consistency: Thin  Diet effective now                     Antimicrobial agents: Anti-infectives (From admission, onward)    Start     Dose/Rate Route Frequency Ordered Stop   04/24/23 1115  ceFAZolin (ANCEF) IVPB 2g/100 mL premix  Status:  Discontinued        2 g 200 mL/hr over 30 Minutes Intravenous On call to O.R. 04/24/23 1105 04/24/23 1343   04/24/23 0915  ceFAZolin (ANCEF) IVPB 2g/100 mL premix        2 g 200 mL/hr over 30 Minutes Intravenous On call to O.R. 04/24/23 0914 04/24/23 1203   04/24/23 0915  vancomycin (VANCOCIN) IVPB 1000 mg/200 mL premix        1,000 mg 200 mL/hr over 60 Minutes Intravenous On call to O.R. 04/24/23 0914 04/24/23 1356   04/24/23 0858  ceFAZolin (ANCEF) 2-4 GM/100ML-% IVPB       Note to Pharmacy: [facility], [place]: cabinet override      04/24/23 0858 04/24/23 1140   04/22/23 1415  metroNIDAZOLE (FLAGYL) tablet 500 mg        500 mg Oral Every 12 hours 04/22/23 1326     04/11/23 1400  vancomycin (VANCOCIN) IVPB 1000 mg/200 mL premix  Status:  Discontinued        1,000 mg 200 mL/hr over  60 Minutes Intravenous Every 8 hours 04/11/23 0828 04/11/23 0942   04/11/23 1100  cefTRIAXone (ROCEPHIN) 2 g in sodium chloride 0.9 % 100 mL IVPB        2 g 200 mL/hr over 30 Minutes Intravenous Every 24 hours 04/11/23 0942     04/11/23 1030  DAPTOmycin (CUBICIN) 600 mg in sodium chloride 0.9 % IVPB        8 mg/kg  76 kg 124 mL/hr over 30 Minutes Intravenous Daily 04/11/23 0942     04/10/23 1200  cefTRIAXone (ROCEPHIN) 2 g in sodium chloride 0.9 % 100 mL IVPB  Status:  Discontinued        2 g 200 mL/hr over 30 Minutes Intravenous Every 24 hours 04/10/23 1101 04/10/23 1728   04/09/23 1800  vancomycin (VANCOCIN) IVPB 1000 mg/200 mL premix  Status:  Discontinued  1,000 mg 200 mL/hr over 60 Minutes Intravenous Every 12 hours 04/09/23 1130 04/11/23 0828   04/09/23 0500  vancomycin (VANCOCIN) IVPB 1000 mg/200 mL premix  Status:  Discontinued        1,000 mg 200 mL/hr over 60 Minutes Intravenous Every 8 hours 04/09/23 0211 04/09/23 1130   04/09/23 0500  piperacillin-tazobactam (ZOSYN) IVPB 3.375 g  Status:  Discontinued        3.375 g 12.5 mL/hr over 240 Minutes Intravenous Every 8 hours 04/09/23 0211 04/10/23 0825   04/08/23 2015  vancomycin (VANCOREADY) IVPB 1500 mg/300 mL        1,500 mg 150 mL/hr over 120 Minutes Intravenous  Once 04/08/23 2001 04/08/23 2323   04/08/23 2000  piperacillin-tazobactam (ZOSYN) IVPB 3.375 g        3.375 g 100 mL/hr over 30 Minutes Intravenous  Once 04/08/23 1949 04/08/23 2044        MEDICATIONS: Scheduled Meds:  ARIPiprazole  15 mg Oral Daily   ascorbic acid  500 mg Oral Daily   Chlorhexidine Gluconate Cloth  6 each Topical Daily   cholecalciferol  1,000 Units Oral Daily   cyanocobalamin  1,000 mcg Subcutaneous Q30 days   docusate sodium  100 mg Oral BID   enoxaparin (LOVENOX) injection  40 mg Subcutaneous Q24H   feeding supplement  237 mL Oral TID BM   furosemide  20 mg Oral Daily   leptospermum manuka honey  1 Application Topical Daily    lidocaine (PF)  10 mL Infiltration Once   metroNIDAZOLE  500 mg Oral Q12H   midodrine  10 mg Oral Q8H   multivitamin with minerals  1 tablet Oral Daily   nutrition supplement (JUVEN)  1 packet Oral BID BM   nystatin   Topical TID   pantoprazole  40 mg Oral Daily   sodium chloride  1 g Oral BID WC   Vashe Wound Irrigation Optime  34 oz Topical Once   vitamin A  10,000 Units Oral Daily   vitamin E  400 Units Oral Daily   Continuous Infusions:  sodium chloride     sodium chloride     cefTRIAXone (ROCEPHIN)  IV 2 g (04/23/23 1307)   DAPTOmycin (CUBICIN) 600 mg in sodium chloride 0.9 % IVPB 600 mg (04/24/23 1454)   methocarbamol (ROBAXIN) IV     PRN Meds:.acetaminophen, alum & mag hydroxide-simeth, baclofen, bisacodyl, calcium carbonate, docusate sodium, magnesium citrate, methocarbamol **OR** methocarbamol (ROBAXIN) IV, metoCLOPramide **OR** metoCLOPramide (REGLAN) injection, morphine injection, ondansetron **OR** ondansetron (ZOFRAN) IV, mouth rinse, oxyCODONE, polyethylene glycol   I have personally reviewed following labs and imaging studies  LABORATORY DATA: CBC: Recent Labs  Lab 04/19/23 0557 04/20/23 1208 04/21/23 0837 04/21/23 2238 04/22/23 0532 04/23/23 0408 04/25/23 0409  WBC 6.7 5.4 6.5  --  8.4 7.3 13.8*  NEUTROABS 5.3 4.1  --   --   --   --   --   HGB 7.9* 7.1* 6.7* 7.8* 7.3* 7.2* 9.0*  HCT 25.6* 22.8* 21.9* 25.1* 23.1* 22.9* 28.9*  MCV 88.9 89.8 92.0  --  90.2 90.9 89.5  PLT PLATELET CLUMPS NOTED ON SMEAR, UNABLE TO ESTIMATE 216 230  --  237 220 306    Basic Metabolic Panel: Recent Labs  Lab 04/21/23 0837 04/22/23 0532 04/23/23 0408 04/24/23 1505 04/25/23 0409  NA 128* 126* 126* 132* 128*  K 3.5 4.0 4.2 3.5 4.3  CL 96* 95* 96* 103 96*  CO2 23 24 23  20* 23  GLUCOSE 122*  94 92 100* 141*  BUN 8 8 8 7 9   CREATININE 0.64 0.60* 0.51* 0.54* 0.65  CALCIUM 7.2* 7.1* 7.1* 6.0* 7.6*  MG  --   --   --   --  2.1    GFR: Estimated Creatinine Clearance: 133.9  mL/min (by C-G formula based on SCr of 0.65 mg/dL).  Liver Function Tests: Recent Labs  Lab 04/19/23 0557 04/25/23 0409  AST 22 13*  ALT 17 14  ALKPHOS 127* 105  BILITOT 0.2* 0.1*  PROT 6.6 7.0  ALBUMIN <1.5* <1.5*   No results for input(s): "LIPASE", "AMYLASE" in the last 168 hours. No results for input(s): "AMMONIA" in the last 168 hours.  Coagulation Profile: No results for input(s): "INR", "PROTIME" in the last 168 hours.  Cardiac Enzymes: Recent Labs  Lab 04/19/23 0557  CKTOTAL 31*    BNP (last 3 results) No results for input(s): "PROBNP" in the last 8760 hours.  Lipid Profile: No results for input(s): "CHOL", "HDL", "LDLCALC", "TRIG", "CHOLHDL", "LDLDIRECT" in the last 72 hours.  Thyroid Function Tests: No results for input(s): "TSH", "T4TOTAL", "FREET4", "T3FREE", "THYROIDAB" in the last 72 hours.  Anemia Panel: No results for input(s): "VITAMINB12", "FOLATE", "FERRITIN", "TIBC", "IRON", "RETICCTPCT" in the last 72 hours.  Urine analysis:    Component Value Date/Time   COLORURINE AMBER (A) 04/09/2023 0016   APPEARANCEUR TURBID (A) 04/09/2023 0016   APPEARANCEUR Hazy 05/08/2013 0330   LABSPEC 1.041 (H) 04/09/2023 0016   LABSPEC 1.034 05/08/2013 0330   PHURINE 8.0 04/09/2023 0016   GLUCOSEU NEGATIVE 04/09/2023 0016   GLUCOSEU Negative 05/08/2013 0330   HGBUR NEGATIVE 04/09/2023 0016   BILIRUBINUR NEGATIVE 04/09/2023 0016   BILIRUBINUR 1+ 05/08/2013 0330   KETONESUR NEGATIVE 04/09/2023 0016   PROTEINUR 100 (A) 04/09/2023 0016   NITRITE NEGATIVE 04/09/2023 0016   LEUKOCYTESUR LARGE (A) 04/09/2023 0016   LEUKOCYTESUR Negative 05/08/2013 0330    Sepsis Labs: Lactic Acid, Venous    Component Value Date/Time   LATICACIDVEN 2.1 (HH) 04/08/2023 2219    MICROBIOLOGY: Recent Results (from the past 240 hour(s))  Culture, blood (Routine X 2) w Reflex to ID Panel     Status: Abnormal   Collection Time: 04/18/23  8:06 AM   Specimen: BLOOD RIGHT ARM   Result Value Ref Range Status   Specimen Description BLOOD RIGHT ARM  Final   Special Requests   Final    BOTTLES DRAWN AEROBIC AND ANAEROBIC Blood Culture adequate volume   Culture  Setup Time   Final    GRAM POSITIVE COCCI IN CHAINS ANAEROBIC BOTTLE ONLY CRITICAL VALUE NOTED.  VALUE IS CONSISTENT WITH PREVIOUSLY REPORTED AND CALLED VALUE.    Culture (A)  Final    PEPTOSTREPTOCOCCUS SPECIES BACTEROIDES STERCORIS BETA LACTAMASE POSITIVE CRITICAL RESULT CALLED TO, READ BACK BY AND VERIFIED WITH: Antoine Primas PHARMD, AT 1323 04/22/23 Renato Shin Performed at [facility], [address], [place], [place] [address]    Report Status 04/22/2023 FINAL  Final  Culture, blood (Routine X 2) w Reflex to ID Panel     Status: None   Collection Time: 04/18/23  8:10 AM   Specimen: BLOOD LEFT HAND  Result Value Ref Range Status   Specimen Description BLOOD LEFT HAND  Final   Special Requests   Final    BOTTLES DRAWN AEROBIC ONLY Blood Culture adequate volume   Culture   Final    NO GROWTH 5 DAYS Performed at [facility], [address],  [place], [place] [address]    Report Status 04/23/2023 FINAL  Final  Surgical pcr screen     Status: None   Collection Time: 04/24/23  9:15 AM   Specimen: Nasal Mucosa; Nasal Swab  Result Value Ref Range Status   MRSA, PCR NEGATIVE NEGATIVE Final   Staphylococcus aureus NEGATIVE NEGATIVE Final    Comment: (NOTE) The Xpert SA Assay (FDA approved for NASAL specimens in patients 60 years of age and older), is one component of a comprehensive surveillance program. It is not intended to diagnose infection nor to guide or monitor treatment. Performed at [facility], [address] [place], [place] [address]   Aerobic/Anaerobic Culture w Gram Stain (surgical/deep wound)     Status: None (Preliminary result)   Collection Time: 04/24/23 11:16 AM   Specimen: Wound; Tissue  Result Value Ref Range Status   Specimen Description TISSUE LEFT HIP   Final   Special Requests PT ON VANC, ANCEF  Final   Gram Stain   Final    NO WBC SEEN FEW GRAM NEGATIVE RODS RARE GRAM POSITIVE COCCI    Culture   Final    MODERATE PROTEUS MIRABILIS CULTURE REINCUBATED FOR BETTER GROWTH Performed at [facility], [address] [place], [place] [address]    Report Status PENDING  Incomplete  Aerobic/Anaerobic Culture w Gram Stain (surgical/deep wound)     Status: None (Preliminary result)   Collection Time: 04/24/23 11:18 AM   Specimen: Wound; Tissue  Result Value Ref Range Status   Specimen Description TISSUE RIGHT HIP  Final   Special Requests PT ON ANCEF,VANC  Final   Gram Stain   Final    ABUNDANT WBC PRESENT, PREDOMINANTLY PMN RARE GRAM POSITIVE COCCI ABUNDANT GRAM NEGATIVE RODS Performed at [facility], [address] [place], [place] [address]    Culture MODERATE PROTEUS MIRABILIS  Final   Report Status PENDING  Incomplete    RADIOLOGY STUDIES/RESULTS: No results found.   LOS: 16 days   Jeoffrey Massed, MD  [facility]    To contact the attending provider between 7A-7P or the covering provider during after hours 7P-7A, please log into the web site [email or web address] and access using universal [place] password for that web site. If you do not have the password, please call the hospital operator.  04/25/2023, 11:35 AM

## 2023-04-25 NOTE — Progress Notes (Signed)
[facility] Infectious Diseases Follow Up Note  Patient Identification: Patient Name: Steve [name] MRN: [id number] Admit Date: 04/08/2023  5:30 PM Age: 32 y.o.Today's Date: 04/25/2023  Reason for Visit: Polymicrobial bacteremia  Principal Problem:   Septic shock (HCC) Active Problems:   Amphetamine abuse (HCC)   MRSA bacteremia   Septic arthritis (HCC)   Pressure ulcer   Wound infection   Current Antibiotics: Vancomycin 7/22- daptomycin starting 7/26-c Ceftriaxone 7/24-c Metronidazole 8/5-c Total days of antibiotics day 18   Lines/Hardwares:   Interval Events: remains afebrile  Labs remarkable for Na 128, albumin < 1.5, WBC 13.8 S/p OR yesterday wit Dr Lajoyce Corners   Assessment 32 year old male with prior history of paraplegia with neurogenic bladder, multiple psychiatric issues including paranoid schizophrenia, PTSD, anxiety and depression, substance abuse, chronic hypotension on midodrine, bilateral chronic hip ulcers and chronic osteomyelitis, prior MRSA bacteremia in 06/2022, Providencia rettgeri bacteremia in 12/22  who presented with weakness and malaise, found to have purulent drainage from his bilateral pressure wounds as well as abdominal pain  # Polymicrobial bacteremia with MRSA, group C streptococcus, Proteus mirabilis, Peptostreptococcus and bacteroides sterocoris 2/2 # 3  - 7/24 blood cx no growth  - 8/2 blood cx peptostreptococcus and bacteroides stercoris in 1 /2 sets - 7/23 TTE with no changes or vegetations - TEE deemed not necessary as prolonged IV antibiotics indicated for osteomyelitis  # Urine cx positive for E coli and Proteus mirabilis - likely  asymptomatic bacteriuria # Septic arthritis of the left hip with osteomyelitis of the left ischium and inferior pubic ramus with associated DU # Healing fracture of the left acetabulum - 8/7 Bilateral hip debridements. OR cx growing Proteus mirabilis.  Noted  exposed bone in both wounds.  Purulent abscesses were decompressed.   Recommendations Continue daptomycin, ceftriaxone and metronidazole pending new cultures Monitor CBC, CMP and CPK  Post op care per orthopedics  Fu cx and make adjustments as needed. Will possibly need to restart abtx clock from last debridement   Rest of the management as per the primary team. Thank you for the consult. Please page with pertinent questions or concerns.  ______________________________________________________________________ Subjective patient seen and examined at the bedside.  Does not talk much, voices no complaints   Vitals BP 108/75 (BP Location: Right Arm)   Pulse 65   Temp 97.9 F (36.6 C) (Oral)   Resp 18   Ht 5\' 11"  (1.803 m)   Wt 72.1 kg   SpO2 100%   BMI 22.17 kg/m    Physical Exam Constitutional: Adult male sitting in the bed and appears comfortable   Cardiovascular:     Rate and Rhythm: Normal rate and regular rhythm.     Heart sounds:  Pulmonary:     Effort: Pulmonary effort is normal.     Comments:  Abdominal:     Palpations:    Tenderness: Nondistended  Musculoskeletal:        General: No swelling or tenderness in peripheral joints  Skin:    Comments: Multiple decubitus ulcers in different areas of the body bandaged.  .   Neurological:     General: Paraplegia    Pertinent Microbiology Results for orders placed or performed during the hospital encounter of 04/08/23  Culture, blood (Routine x 2)     Status: None   Collection Time: 04/08/23  6:22 PM   Specimen: BLOOD  Result Value Ref Range Status   Specimen Description BLOOD SITE NOT SPECIFIED  Final   Special Requests  Final    BOTTLES DRAWN AEROBIC ONLY Blood Culture results may not be optimal due to an inadequate volume of blood received in culture bottles   Culture   Final    NO GROWTH 5 DAYS Performed at [facility], [address], [place], [place] [address]    Report Status 04/13/2023  FINAL  Final  Culture, blood (Routine x 2)     Status: Abnormal   Collection Time: 04/08/23  8:08 PM   Specimen: BLOOD  Result Value Ref Range Status   Specimen Description BLOOD SITE NOT SPECIFIED  Final   Special Requests   Final    BOTTLES DRAWN AEROBIC AND ANAEROBIC Blood Culture adequate volume   Culture  Setup Time   Final    GRAM POSITIVE COCCI IN CHAINS IN BOTH AEROBIC AND ANAEROBIC BOTTLES CRITICAL RESULT CALLED TO, READ BACK BY AND VERIFIED WITH: E. SINCLAIR PHARMD, AT 0940 D. VANHOOK GRAM POSITIVE COCCI IN CLUSTERS AEROBIC BOTTLE ONLY CRITICAL RESULT CALLED TO, READ BACK BY AND VERIFIED WITH: PHARMD EMILY S. [phone] FCP    Culture (A)  Final    METHICILLIN RESISTANT STAPHYLOCOCCUS AUREUS STREPTOCOCCUS GROUP C PROTEUS MIRABILIS CRITICAL RESULT CALLED TO, READ BACK BY AND VERIFIED WITH: PHARMD E.SINCLAIR AT 0940 ON 04/11/2023 BY T.SAAD. SEE SEPARATE REPORT FOR DAPTOMYCIN MIC Performed at [facility], [address] [place], [place] [address]    Report Status 04/22/2023 FINAL  Final   Organism ID, Bacteria METHICILLIN RESISTANT STAPHYLOCOCCUS AUREUS  Final   Organism ID, Bacteria STREPTOCOCCUS GROUP C  Final   Organism ID, Bacteria PROTEUS MIRABILIS  Final      Susceptibility   Methicillin resistant staphylococcus aureus - MIC*    CIPROFLOXACIN >=8 RESISTANT Resistant     ERYTHROMYCIN >=8 RESISTANT Resistant     GENTAMICIN <=0.5 SENSITIVE Sensitive     OXACILLIN >=4 RESISTANT Resistant     TETRACYCLINE >=16 RESISTANT Resistant     VANCOMYCIN <=0.5 SENSITIVE Sensitive     TRIMETH/SULFA 160 RESISTANT Resistant     CLINDAMYCIN RESISTANT Resistant     RIFAMPIN <=0.5 SENSITIVE Sensitive     Inducible Clindamycin POSITIVE Resistant     LINEZOLID 2 SENSITIVE Sensitive     * METHICILLIN RESISTANT STAPHYLOCOCCUS AUREUS   Proteus mirabilis - MIC*    AMPICILLIN <=2 SENSITIVE Sensitive     CEFEPIME <=0.12 SENSITIVE Sensitive     CEFTAZIDIME <=1 SENSITIVE Sensitive      CEFTRIAXONE <=0.25 SENSITIVE Sensitive     CIPROFLOXACIN <=0.25 SENSITIVE Sensitive     GENTAMICIN <=1 SENSITIVE Sensitive     IMIPENEM 2 SENSITIVE Sensitive     TRIMETH/SULFA <=20 SENSITIVE Sensitive     AMPICILLIN/SULBACTAM <=2 SENSITIVE Sensitive     PIP/TAZO <=4 SENSITIVE Sensitive     * PROTEUS MIRABILIS   Streptococcus group c - MIC*    CLINDAMYCIN RESISTANT Resistant     AMPICILLIN <=0.25 SENSITIVE Sensitive     ERYTHROMYCIN 2 RESISTANT Resistant     VANCOMYCIN 0.25 SENSITIVE Sensitive     CEFTRIAXONE <=0.12 SENSITIVE Sensitive     LEVOFLOXACIN 0.5 SENSITIVE Sensitive     PENICILLIN <=0.06 SENSITIVE Sensitive     * STREPTOCOCCUS GROUP C  Blood Culture ID Panel (Reflexed)     Status: Abnormal   Collection Time: 04/08/23  8:08 PM  Result Value Ref Range Status   Enterococcus faecalis NOT DETECTED NOT DETECTED Final   Enterococcus Faecium NOT DETECTED NOT DETECTED Final   Listeria monocytogenes NOT  DETECTED NOT DETECTED Final   Staphylococcus species NOT DETECTED NOT DETECTED Final   Staphylococcus aureus (BCID) NOT DETECTED NOT DETECTED Final   Staphylococcus epidermidis NOT DETECTED NOT DETECTED Final   Staphylococcus lugdunensis NOT DETECTED NOT DETECTED Final   Streptococcus species DETECTED (A) NOT DETECTED Final    Comment: Not Enterococcus species, Streptococcus agalactiae, Streptococcus pyogenes, or Streptococcus pneumoniae. CRITICAL RESULT CALLED TO, READ BACK BY AND VERIFIED WITH: E. SINCLAIR PHARMD, AT 0940 D. VANHOOK    Streptococcus agalactiae NOT DETECTED NOT DETECTED Final   Streptococcus pneumoniae NOT DETECTED NOT DETECTED Final   Streptococcus pyogenes NOT DETECTED NOT DETECTED Final   A.calcoaceticus-baumannii NOT DETECTED NOT DETECTED Final   Bacteroides fragilis NOT DETECTED NOT DETECTED Final   Enterobacterales NOT DETECTED NOT DETECTED Final   Enterobacter cloacae complex NOT DETECTED NOT DETECTED Final   Escherichia coli NOT DETECTED NOT  DETECTED Final   Klebsiella aerogenes NOT DETECTED NOT DETECTED Final   Klebsiella oxytoca NOT DETECTED NOT DETECTED Final   Klebsiella pneumoniae NOT DETECTED NOT DETECTED Final   Proteus species NOT DETECTED NOT DETECTED Final   Salmonella species NOT DETECTED NOT DETECTED Final   Serratia marcescens NOT DETECTED NOT DETECTED Final   Haemophilus influenzae NOT DETECTED NOT DETECTED Final   Neisseria meningitidis NOT DETECTED NOT DETECTED Final   Pseudomonas aeruginosa NOT DETECTED NOT DETECTED Final   Stenotrophomonas maltophilia NOT DETECTED NOT DETECTED Final   Candida albicans NOT DETECTED NOT DETECTED Final   Candida auris NOT DETECTED NOT DETECTED Final   Candida glabrata NOT DETECTED NOT DETECTED Final   Candida krusei NOT DETECTED NOT DETECTED Final   Candida parapsilosis NOT DETECTED NOT DETECTED Final   Candida tropicalis NOT DETECTED NOT DETECTED Final   Cryptococcus neoformans/gattii NOT DETECTED NOT DETECTED Final    Comment: Performed at [facility], [address], [place], [place] [address]  Blood Culture ID Panel (Reflexed)     Status: Abnormal   Collection Time: 04/08/23  8:08 PM  Result Value Ref Range Status   Enterococcus faecalis NOT DETECTED NOT DETECTED Final   Enterococcus Faecium NOT DETECTED NOT DETECTED Final   Listeria monocytogenes NOT DETECTED NOT DETECTED Final   Staphylococcus species DETECTED (A) NOT DETECTED Final    Comment: CRITICAL RESULT CALLED TO, READ BACK BY AND VERIFIED WITH: PHARMD EMILY S. [phone] FCP    Staphylococcus aureus (BCID) DETECTED (A) NOT DETECTED Final    Comment: Methicillin (oxacillin)-resistant Staphylococcus aureus (MRSA). MRSA is predictably resistant to beta-lactam antibiotics (except ceftaroline). Preferred therapy is vancomycin unless clinically contraindicated. Patient requires contact precautions if  hospitalized. CRITICAL RESULT CALLED TO, READ BACK BY AND VERIFIED WITH: PHARMD EMILY S. [phone] FCP     Staphylococcus epidermidis NOT DETECTED NOT DETECTED Final   Staphylococcus lugdunensis NOT DETECTED NOT DETECTED Final   Streptococcus species DETECTED (A) NOT DETECTED Final    Comment: Not Enterococcus species, Streptococcus agalactiae, Streptococcus pyogenes, or Streptococcus pneumoniae. CRITICAL RESULT CALLED TO, READ BACK BY AND VERIFIED WITH: PHARMD EMILY S. [phone] FCP    Streptococcus agalactiae NOT DETECTED NOT DETECTED Final   Streptococcus pneumoniae NOT DETECTED NOT DETECTED Final   Streptococcus pyogenes NOT DETECTED NOT DETECTED Final   A.calcoaceticus-baumannii NOT DETECTED NOT DETECTED Final   Bacteroides fragilis NOT DETECTED NOT DETECTED Final   Enterobacterales NOT DETECTED NOT DETECTED Final   Enterobacter cloacae complex NOT DETECTED NOT DETECTED Final   Escherichia coli NOT DETECTED NOT DETECTED Final   Klebsiella aerogenes  NOT DETECTED NOT DETECTED Final   Klebsiella oxytoca NOT DETECTED NOT DETECTED Final   Klebsiella pneumoniae NOT DETECTED NOT DETECTED Final   Proteus species NOT DETECTED NOT DETECTED Final   Salmonella species NOT DETECTED NOT DETECTED Final   Serratia marcescens NOT DETECTED NOT DETECTED Final   Haemophilus influenzae NOT DETECTED NOT DETECTED Final   Neisseria meningitidis NOT DETECTED NOT DETECTED Final   Pseudomonas aeruginosa NOT DETECTED NOT DETECTED Final   Stenotrophomonas maltophilia NOT DETECTED NOT DETECTED Final   Candida albicans NOT DETECTED NOT DETECTED Final   Candida auris NOT DETECTED NOT DETECTED Final   Candida glabrata NOT DETECTED NOT DETECTED Final   Candida krusei NOT DETECTED NOT DETECTED Final   Candida parapsilosis NOT DETECTED NOT DETECTED Final   Candida tropicalis NOT DETECTED NOT DETECTED Final   Cryptococcus neoformans/gattii NOT DETECTED NOT DETECTED Final   Meth resistant mecA/C and MREJ DETECTED (A) NOT DETECTED Final    Comment: CRITICAL RESULT CALLED TO, READ BACK BY AND VERIFIED WITH: PHARMD  EMILY S. [phone] FCP Performed at [facility] Lab, [address] [place], [place] [address]   Urine Culture     Status: Abnormal   Collection Time: 04/09/23 12:16 AM   Specimen: Urine, Random  Result Value Ref Range Status   Specimen Description URINE, RANDOM  Final   Special Requests   Final    NONE Reflexed from [id number] Performed at [facility], [address], [place], [place] [address]    Culture (A)  Final    >=100,000 COLONIES/mL PROTEUS MIRABILIS 30,000 COLONIES/mL ESCHERICHIA COLI    Report Status 04/13/2023 FINAL  Final   Organism ID, Bacteria ESCHERICHIA COLI (A)  Final   Organism ID, Bacteria PROTEUS MIRABILIS (A)  Final      Susceptibility   Escherichia coli - MIC*    AMPICILLIN >=32 RESISTANT Resistant     CEFAZOLIN <=4 SENSITIVE Sensitive     CEFEPIME <=0.12 SENSITIVE Sensitive     CEFTRIAXONE <=0.25 SENSITIVE Sensitive     CIPROFLOXACIN >=4 RESISTANT Resistant     GENTAMICIN <=1 SENSITIVE Sensitive     IMIPENEM <=0.25 SENSITIVE Sensitive     NITROFURANTOIN <=16 SENSITIVE Sensitive     TRIMETH/SULFA >=320 RESISTANT Resistant     AMPICILLIN/SULBACTAM 4 SENSITIVE Sensitive     PIP/TAZO <=4 SENSITIVE Sensitive     * 30,000 COLONIES/mL ESCHERICHIA COLI   Proteus mirabilis - MIC*    AMPICILLIN 16 INTERMEDIATE Intermediate     CEFAZOLIN <=4 SENSITIVE Sensitive     CEFEPIME <=0.12 SENSITIVE Sensitive     CEFTRIAXONE <=0.25 SENSITIVE Sensitive     CIPROFLOXACIN <=0.25 SENSITIVE Sensitive     GENTAMICIN <=1 SENSITIVE Sensitive     IMIPENEM 1 SENSITIVE Sensitive     NITROFURANTOIN 128 RESISTANT Resistant     TRIMETH/SULFA <=20 SENSITIVE Sensitive     AMPICILLIN/SULBACTAM 8 SENSITIVE Sensitive     * >=100,000 COLONIES/mL PROTEUS MIRABILIS  Culture, blood (Routine X 2) w Reflex to ID Panel     Status: None   Collection Time: 04/10/23  6:50 AM   Specimen: BLOOD LEFT HAND  Result Value Ref Range Status   Specimen Description BLOOD LEFT HAND  Final    Special Requests   Final    BOTTLES DRAWN AEROBIC AND ANAEROBIC Blood Culture adequate volume   Culture   Final    NO GROWTH 5 DAYS Performed at [facility], [address], [place], [place] [address]  Report Status 04/15/2023 FINAL  Final  Culture, blood (Routine X 2) w Reflex to ID Panel     Status: None   Collection Time: 04/10/23  6:50 AM   Specimen: BLOOD LEFT HAND  Result Value Ref Range Status   Specimen Description BLOOD LEFT HAND  Final   Special Requests   Final    BOTTLES DRAWN AEROBIC AND ANAEROBIC Blood Culture adequate volume   Culture   Final    NO GROWTH 5 DAYS Performed at [facility], [address] [place], [place] [address]    Report Status 04/15/2023 FINAL  Final  Culture, blood (Routine X 2) w Reflex to ID Panel     Status: Abnormal   Collection Time: 04/18/23  8:06 AM   Specimen: BLOOD RIGHT ARM  Result Value Ref Range Status   Specimen Description BLOOD RIGHT ARM  Final   Special Requests   Final    BOTTLES DRAWN AEROBIC AND ANAEROBIC Blood Culture adequate volume   Culture  Setup Time   Final    GRAM POSITIVE COCCI IN CHAINS ANAEROBIC BOTTLE ONLY CRITICAL VALUE NOTED.  VALUE IS CONSISTENT WITH PREVIOUSLY REPORTED AND CALLED VALUE.    Culture (A)  Final    PEPTOSTREPTOCOCCUS SPECIES BACTEROIDES STERCORIS BETA LACTAMASE POSITIVE CRITICAL RESULT CALLED TO, READ BACK BY AND VERIFIED WITH: Antoine Primas PHARMD, AT 1323 04/22/23 Renato Shin Performed at [facility], [address], [place], [place] [address]    Report Status 04/22/2023 FINAL  Final  Culture, blood (Routine X 2) w Reflex to ID Panel     Status: None   Collection Time: 04/18/23  8:10 AM   Specimen: BLOOD LEFT HAND  Result Value Ref Range Status   Specimen Description BLOOD LEFT HAND  Final   Special Requests   Final    BOTTLES DRAWN AEROBIC ONLY Blood Culture adequate volume   Culture   Final    NO GROWTH 5 DAYS Performed at [facility], [address]  [place], [place] [address]    Report Status 04/23/2023 FINAL  Final  Surgical pcr screen     Status: None   Collection Time: 04/24/23  9:15 AM   Specimen: Nasal Mucosa; Nasal Swab  Result Value Ref Range Status   MRSA, PCR NEGATIVE NEGATIVE Final   Staphylococcus aureus NEGATIVE NEGATIVE Final    Comment: (NOTE) The Xpert SA Assay (FDA approved for NASAL specimens in patients 11 years of age and older), is one component of a comprehensive surveillance program. It is not intended to diagnose infection nor to guide or monitor treatment. Performed at [facility], [address] [place], [place] [address]   Aerobic/Anaerobic Culture w Gram Stain (surgical/deep wound)     Status: None (Preliminary result)   Collection Time: 04/24/23 11:16 AM   Specimen: Wound; Tissue  Result Value Ref Range Status   Specimen Description TISSUE LEFT HIP  Final   Special Requests PT ON VANC, ANCEF  Final   Gram Stain   Final    NO WBC SEEN FEW GRAM NEGATIVE RODS RARE GRAM POSITIVE COCCI    Culture   Final    MODERATE PROTEUS MIRABILIS CULTURE REINCUBATED FOR BETTER GROWTH Performed at [facility], [address], [place], [place] [address]    Report Status PENDING  Incomplete  Aerobic/Anaerobic Culture w Gram Stain (surgical/deep wound)     Status: None (Preliminary result)   Collection Time: 04/24/23 11:18 AM  Specimen: Wound; Tissue  Result Value Ref Range Status   Specimen Description TISSUE RIGHT HIP  Final   Special Requests PT ON ANCEF,VANC  Final   Gram Stain   Final    ABUNDANT WBC PRESENT, PREDOMINANTLY PMN RARE GRAM POSITIVE COCCI ABUNDANT GRAM NEGATIVE RODS Performed at [facility], [address], [place], [place] [address]    Culture MODERATE PROTEUS MIRABILIS  Final   Report Status PENDING  Incomplete      Pertinent Lab.    Latest Ref Rng & Units 04/25/2023    4:09 AM 04/23/2023    4:08 AM 04/22/2023    5:32 AM  CBC  WBC 4.0 - 10.5 K/uL 13.8  7.3  8.4    Hemoglobin 13.0 - 17.0 g/dL 9.0  7.2  7.3   Hematocrit 39.0 - 52.0 % 28.9  22.9  23.1   Platelets 150 - 400 K/uL 306  220  237       Latest Ref Rng & Units 04/25/2023    4:09 AM 04/24/2023    3:05 PM 04/23/2023    4:08 AM  CMP  Glucose 70 - 99 mg/dL 562  130  92   BUN 6 - 20 mg/dL 9  7  8    Creatinine 0.61 - 1.24 mg/dL 8.65  7.84  6.96   Sodium 135 - 145 mmol/L 128  132  126   Potassium 3.5 - 5.1 mmol/L 4.3  3.5  4.2   Chloride 98 - 111 mmol/L 96  103  96   CO2 22 - 32 mmol/L 23  20  23    Calcium 8.9 - 10.3 mg/dL 7.6  6.0  7.1   Total Protein 6.5 - 8.1 g/dL 7.0     Total Bilirubin 0.3 - 1.2 mg/dL 0.1     Alkaline Phos 38 - 126 U/L 105     AST 15 - 41 U/L 13     ALT 0 - 44 U/L 14        Pertinent Imaging today Plain films and CT images have been personally visualized and interpreted; radiology reports have been reviewed. Decision making incorporated into the Impression  No results found.  I have personally spent 50 minutes involved in face-to-face and non-face-to-face activities for this patient on the day of the visit. Professional time spent includes the following activities: Preparing to see the patient (review of tests), Obtaining and/or reviewing separately obtained history (admission/discharge record), Performing a medically appropriate examination and/or evaluation , Ordering medications/tests/procedures, referring and communicating with other health care professionals, Documenting clinical information in the EMR, Independently interpreting results (not separately reported), Communicating results to the patient/family/caregiver, Counseling and educating the patient/family/caregiver and Care coordination (not separately reported).   Plan d/w requesting provider as well as ID pharm D  Note: This document was prepared using dragon voice recognition software and may include unintentional dictation errors.   Electronically signed by:   Odette Fraction, MD Infectious Disease  Physician [facility] for Infectious Disease Pager: [phone]

## 2023-04-25 NOTE — Plan of Care (Signed)

## 2023-04-26 DIAGNOSIS — M0009 Staphylococcal polyarthritis: Secondary | ICD-10-CM | POA: Diagnosis not present

## 2023-04-26 DIAGNOSIS — F1510 Other stimulant abuse, uncomplicated: Secondary | ICD-10-CM | POA: Diagnosis not present

## 2023-04-26 DIAGNOSIS — A419 Sepsis, unspecified organism: Secondary | ICD-10-CM | POA: Diagnosis not present

## 2023-04-26 DIAGNOSIS — R6521 Severe sepsis with septic shock: Secondary | ICD-10-CM | POA: Diagnosis not present

## 2023-04-26 MED ORDER — ALBUMIN HUMAN 25 % IV SOLN
25.0000 g | Freq: Once | INTRAVENOUS | Status: AC
  Administered 2023-04-26: 25 g via INTRAVENOUS
  Filled 2023-04-26: qty 100

## 2023-04-26 MED ORDER — FUROSEMIDE 20 MG PO TABS
20.0000 mg | ORAL_TABLET | ORAL | Status: DC
  Administered 2023-04-27: 20 mg via ORAL
  Filled 2023-04-26 (×2): qty 1

## 2023-04-26 NOTE — TOC Progression Note (Signed)
Transition of Care [facility]) - Progression Note    Patient Details  Name: Steve [name] MRN: [id number] Date of Birth: [birth year]  Transition of Care [facility]) CM/SW Contact  Mearl Latin, LCSW Phone Number: 04/26/2023, 3:26 PM  Clinical Narrative:    TOC remains available for needs.      Barriers to Discharge: Continued Medical Work up, English as a second language teacher, SNF Pending bed offer (Substance use history w/ need for PICC)  Expected Discharge Plan and Services In-house Referral: Clinical Social Work   Post Acute Care Choice: Skilled Nursing Facility Living arrangements for the past 2 months: Single Family Home                                       Social Determinants of Health (SDOH) Interventions SDOH Screenings   Food Insecurity: No Food Insecurity (06/25/2022)  Housing: Low Risk  (06/25/2022)  Transportation Needs: No Transportation Needs (06/25/2022)  Utilities: Not At Risk (06/25/2022)  Tobacco Use: High Risk (04/24/2023)    Readmission Risk Interventions    07/01/2022   10:10 AM 06/29/2022    9:19 AM  Readmission Risk Prevention Plan  Transportation Screening Complete Complete  Medication Review Oceanographer) Complete Complete  PCP or Specialist appointment within 3-5 days of discharge Complete Complete  HRI or Home Care Consult Complete Complete  SW Recovery Care/Counseling Consult Complete Complete  Palliative Care Screening Not Applicable Not Applicable  Skilled Nursing Facility Not Applicable Not Applicable

## 2023-04-26 NOTE — Plan of Care (Signed)

## 2023-04-26 NOTE — Progress Notes (Signed)
PT Cancellation Note  Patient Details Name: Steve [name] MRN: [id number] DOB: [birth year]   Cancelled Treatment:    Reason Eval/Treat Not Completed: Medical issues which prohibited therapy;Other (comment) (In AM pt had temperature at 94 degrees, hold PT at that time. In PM pt states he has significant pain and has already worked with OT. Due to low BP's was unable to have pain medication and pt states pain is too great. Will continue to follow up as able.)  [name], DPT, CLT  Acute Rehabilitation Services Office: [phone] (Secure chat preferred)   [name] 04/26/2023, 3:49 PM

## 2023-04-26 NOTE — Progress Notes (Signed)
PROGRESS NOTE        PATIENT DETAILS Name: Steve [name] Age: 32 y.o. Sex: male Date of Birth: 1990-12-03 Admit Date: 04/08/2023 Admitting Physician Briant Sites, DO ZOX:[id number], [facility]  Brief Summary: 32 year old male with with history of paraplegia-known sacral decubitus ulcers with chronic osteomyelitis, substance abuse, chronic hypotension on midodrine-presented with septic shock in the setting of polymicrobial bacteremia and acute on chronic osteomyelitis of sacral decubitus ulcers and left hip septic arthritis.  Initially admitted to the ICU-stabilized and subsequently transferred to [facility].  Evaluated by orthopedics-no surgical interventions recommended.  Evaluated by ID-with plans for 6 weeks of IV daptomycin/Rocephin.  Unfortunately-further hospital course complicated by intermittent fevers-upon further evaluation with MRI pelvis-he was found to have abscess.  See below for further details.     Social worker following for placement-as patient is not a candidate for outpatient IV antibiotics (history of drug use)Patient is a 32 y.o.  male    Significant studies: 7/22>> CT abdomen/pelvis: Septic arthritis left hip with osteomyelitis of left ischium/inferior pubic rami.  Posterior left hip dislocation. 7/23>> TTE: EF 60-65% 8/02>> CT pelvis: Unchanged left hip dislocation-chronic osteomyelitis left pelvis related to ischemic ulcer, possible ill-defined fluid collection adjacent to the ulcer. 8/02>> bilateral lower extremity Doppler: No DVT 8/03>> MRI pelvis: Rim enhancing collection posterior right proximal femur concerning for abscess, multiple small abscesses around proximal left femur  Significant microbiology data: 7/22>> blood culture: Staph aureus, group C streptococcus, Proteus 7/23>> blood culture: Proteus/E. Coli 7/24>> blood culture: No growth 8/01>> blood culture: Peptostreptococcus, Bacteroides 8/01>> blood culture: No  growth 8/07>> intraoperative tissue culture right hip: Proteus Mirabilis 8/07>> intraoperative tissue culture left hip: Proteus mirabilis  Procedures: 7/29>> PICC by IR 8/07>> bilateral hip excisional debridement  Consults: PCCM ID Ortho  Subjective: No major issues overnight-asking when he can go home.  Mildly hypothermic-but otherwise is very much stable and unchanged.  Objective: Vitals: Blood pressure 111/73, pulse 72, temperature (!) 96.4 F (35.8 C), temperature source Axillary, resp. rate 16, height 5\' 11"  (1.803 m), weight 72.1 kg, SpO2 100%.   Exam: Gen Exam:Alert awake-not in any distress HEENT:atraumatic, normocephalic Chest: B/L clear to auscultation anteriorly CVS:S1S2 regular Abdomen:soft non tender, non distended Extremities:no edema Neurology: Paraplegic Skin: no rash  Pertinent Labs/Radiology:    Latest Ref Rng & Units 04/25/2023    4:09 AM 04/23/2023    4:08 AM 04/22/2023    5:32 AM  CBC  WBC 4.0 - 10.5 K/uL 13.8  7.3  8.4   Hemoglobin 13.0 - 17.0 g/dL 9.0  7.2  7.3   Hematocrit 39.0 - 52.0 % 28.9  22.9  23.1   Platelets 150 - 400 K/uL 306  220  237     Lab Results  Component Value Date   NA 130 (L) 04/26/2023   K 4.3 04/26/2023   CL 94 (L) 04/26/2023   CO2 23 04/26/2023      Assessment/Plan: Septic shock-POA Acute on chronic osteomyelitis septic arthritis of the left hip  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 S/p surgical debridement of bilateral hips on 8/7 (intraoperative cultures positive for Proteus).  TSH on 8/1 stable, a.m. cortisol in 8/2  stable as well.  If hypothermia persists-repeat a.m. cortisol tomorrow. ID following-remains on daptomycin/ceftriaxone/metronidazole.  Use warming blanket  as needed for mild hypothermia. Per ID-TEE not needed as patient will be on a prolonged course of antibiotics.  Note-not a candidate for home IV infusion therapies-Per social work-[facility]and hence will remain inpatient here at [facility] to complete course of IV antibiotics.   Acute on chronic normocytic anemia  Secondary to acute illness-superimposed on anemia of chronic disease Continue to follow CBC-last transfusion on 7/29.   Hypotension Am cortisol on 8/2 was > 20 BP soft was stable this morning-but required IV albumin for transient hypotension overnight. Suspect some amount of hypertension is due to central mechanism in the setting of  cord injury.   Hyponatremia Due to excessive fluid intake Fluid restrict Salt tablets Low-dose Lasix starting 8/6-will change to every other day dosing-as he was hypotensive overnight. Follow electrolytes-sodium levels fluctuate quite a bit   Normocytic anemia Due to acute illness/chronic inflammation from sacral osteomyelitis/ulcers Continue to Hb-required PRBC transfusion on 8/4.   GERD PPI   Vitamin D deficiency continue with supplements   Vitamin B12 deficiency Continue supplementation   Vitamin A, C, D deficiency, zinc deficiency Continue with supplements   Neurogenic bladder/UTI Paraplegia Continue Foley, urine culture growing Proteus, continue with Rocephin   Schizophrenia/PTSD Seems stable Continue Abilify   Substance abuse  Self reports use of methamphetamines    3 mm left solid pulmonary nodule Further workup deferred to outpatient setting-as this is an incidental finding  Nutrition Status: Nutrition Problem: Inadequate oral intake Etiology: decreased appetite Signs/Symptoms: per patient/family report Interventions: Refer to RD note for recommendations    Pressure Ulcer: Pressure Injury 05/30/21 Ankle Left;Lateral Unstageable - Full thickness tissue loss in which the base of  the injury is covered by slough (yellow, tan, gray, green or brown) and/or eschar (tan, brown or black) in the wound bed. (Active)  05/30/21 1938  Location: Ankle  Location Orientation: Left;Lateral  Staging: Unstageable - Full thickness tissue loss in which the base of the injury is covered by slough (yellow, tan, gray, green or brown) and/or eschar (tan, brown or black) in the wound bed.  Wound Description (Comments):   Present on Admission: Yes  Dressing Type Foam - Lift dressing to assess site every shift 04/25/23 2000     Pressure Injury 08/29/21 Ischial tuberosity Left Stage 4 - Full thickness tissue loss with exposed bone, tendon or muscle. 5G3O7 (Active)  08/29/21 0100  Location: Ischial tuberosity  Location Orientation: Left  Staging: Stage 4 - Full thickness tissue loss with exposed bone, tendon or muscle.  Wound Description (Comments): [id number]  Present on Admission: Yes  Dressing Type Gauze (Comment) 04/25/23 2000     Pressure Injury 04/09/23 Pretibial Right;Lateral Stage 2 -  Partial thickness loss of dermis presenting as a shallow open injury with a red, pink wound bed without slough. full thickness, NOT a pressure injury (Active)  04/09/23 0351  Location: Pretibial  Location Orientation: Right;Lateral  Staging: Stage 2 -  Partial thickness loss of dermis presenting as a shallow open injury with a red, pink wound bed without slough.  Wound Description (Comments): full thickness, NOT a pressure injury  Present on Admission: Yes  Dressing Type Foam - Lift dressing to assess site every shift 04/25/23 2000     Pressure Injury 04/09/23 Ischial tuberosity Right Stage 4 - Full thickness tissue loss with exposed bone, tendon or muscle. (Active)  04/09/23 0100  Location: Ischial tuberosity  Location Orientation: Right  Staging: Stage 4 - Full thickness tissue loss with exposed bone, tendon or muscle.  Wound Description (Comments):   Present on Admission: Yes  Dressing Type Gauze  (Comment) 04/25/23 2000     Pressure Injury 04/09/23 Elbow Left;Posterior Stage 2 -  Partial thickness loss of dermis presenting as a shallow open injury with a red, pink wound bed without slough. (Active)  04/09/23 0330  Location: Elbow  Location Orientation: Left;Posterior  Staging: Stage 2 -  Partial thickness loss of dermis presenting as a shallow open injury with a red, pink wound bed without slough.  Wound Description (Comments):   Present on Admission: Yes  Dressing Type Foam - Lift dressing to assess site every shift 04/25/23 2000    BMI : Estimated body mass index is 22.17 kg/m as calculated from the following:   Height as of this encounter: 5\' 11"  (1.803 m).   Weight as of this encounter: 72.1 kg.   Code status:   Code Status: Full Code   DVT Prophylaxis: SCDs Start: 04/24/23 1718 enoxaparin (LOVENOX) injection 40 mg Start: 04/16/23 2200 SCDs Start: 04/09/23 0140   Family Communication: None at bedside   Disposition Plan: Status is: Inpatient Remains inpatient appropriate because: Severity of illness   Planned Discharge Destination:Home if willing but will remain inpatient to complete approximately 6 weeks of IV antibiotics-as patient not a candidate for outpatient therapies.   Diet: Diet Order             Diet regular Room service appropriate? Yes; Fluid consistency: Thin  Diet effective now                     Antimicrobial agents: Anti-infectives (From admission, onward)    Start     Dose/Rate Route Frequency Ordered Stop   04/24/23 1115  ceFAZolin (ANCEF) IVPB 2g/100 mL premix  Status:  Discontinued        2 g 200 mL/hr over 30 Minutes Intravenous On call to O.R. 04/24/23 1105 04/24/23 1343   04/24/23 0915  ceFAZolin (ANCEF) IVPB 2g/100 mL premix        2 g 200 mL/hr over 30 Minutes Intravenous On call to O.R. 04/24/23 0914 04/24/23 1203   04/24/23 0915  vancomycin (VANCOCIN) IVPB 1000 mg/200 mL premix        1,000 mg 200 mL/hr over 60  Minutes Intravenous On call to O.R. 04/24/23 0914 04/24/23 1356   04/24/23 0858  ceFAZolin (ANCEF) 2-4 GM/100ML-% IVPB       Note to Pharmacy: [facility], [place]: cabinet override      04/24/23 0858 04/24/23 1140   04/22/23 1415  metroNIDAZOLE (FLAGYL) tablet 500 mg        500 mg Oral Every 12 hours 04/22/23 1326     04/11/23 1400  vancomycin (VANCOCIN) IVPB 1000 mg/200 mL premix  Status:  Discontinued        1,000 mg 200 mL/hr over 60 Minutes Intravenous Every 8 hours 04/11/23 0828 04/11/23 0942   04/11/23 1100  cefTRIAXone (ROCEPHIN) 2 g in sodium chloride 0.9 % 100 mL IVPB        2 g 200 mL/hr over 30 Minutes Intravenous Every 24 hours 04/11/23 0942     04/11/23 1030  DAPTOmycin (CUBICIN) 600 mg in sodium chloride 0.9 % IVPB        8 mg/kg  76 kg 124 mL/hr over 30 Minutes Intravenous Daily 04/11/23 0942  04/10/23 1200  cefTRIAXone (ROCEPHIN) 2 g in sodium chloride 0.9 % 100 mL IVPB  Status:  Discontinued        2 g 200 mL/hr over 30 Minutes Intravenous Every 24 hours 04/10/23 1101 04/10/23 1728   04/09/23 1800  vancomycin (VANCOCIN) IVPB 1000 mg/200 mL premix  Status:  Discontinued        1,000 mg 200 mL/hr over 60 Minutes Intravenous Every 12 hours 04/09/23 1130 04/11/23 0828   04/09/23 0500  vancomycin (VANCOCIN) IVPB 1000 mg/200 mL premix  Status:  Discontinued        1,000 mg 200 mL/hr over 60 Minutes Intravenous Every 8 hours 04/09/23 0211 04/09/23 1130   04/09/23 0500  piperacillin-tazobactam (ZOSYN) IVPB 3.375 g  Status:  Discontinued        3.375 g 12.5 mL/hr over 240 Minutes Intravenous Every 8 hours 04/09/23 0211 04/10/23 0825   04/08/23 2015  vancomycin (VANCOREADY) IVPB 1500 mg/300 mL        1,500 mg 150 mL/hr over 120 Minutes Intravenous  Once 04/08/23 2001 04/08/23 2323   04/08/23 2000  piperacillin-tazobactam (ZOSYN) IVPB 3.375 g        3.375 g 100 mL/hr over 30 Minutes Intravenous  Once 04/08/23 1949 04/08/23 2044        MEDICATIONS: Scheduled Meds:   ARIPiprazole  15 mg Oral Daily   ascorbic acid  500 mg Oral Daily   Chlorhexidine Gluconate Cloth  6 each Topical Daily   cholecalciferol  1,000 Units Oral Daily   cyanocobalamin  1,000 mcg Subcutaneous Q30 days   docusate sodium  100 mg Oral BID   enoxaparin (LOVENOX) injection  40 mg Subcutaneous Q24H   feeding supplement  237 mL Oral TID BM   [START ON 04/27/2023] furosemide  20 mg Oral QODAY   leptospermum manuka honey  1 Application Topical Daily   lidocaine (PF)  10 mL Infiltration Once   metroNIDAZOLE  500 mg Oral Q12H   midodrine  10 mg Oral Q8H   multivitamin with minerals  1 tablet Oral Daily   nutrition supplement (JUVEN)  1 packet Oral BID BM   nystatin   Topical TID   pantoprazole  40 mg Oral Daily   sodium chloride  1 g Oral BID WC   Vashe Wound Irrigation Optime  34 oz Topical Once   vitamin A  10,000 Units Oral Daily   vitamin E  400 Units Oral Daily   Continuous Infusions:  sodium chloride     sodium chloride     cefTRIAXone (ROCEPHIN)  IV Stopped (04/25/23 1250)   DAPTOmycin (CUBICIN) 600 mg in sodium chloride 0.9 % IVPB Stopped (04/25/23 1344)   methocarbamol (ROBAXIN) IV     PRN Meds:.acetaminophen, alum & mag hydroxide-simeth, baclofen, bisacodyl, calcium carbonate, docusate sodium, magnesium citrate, methocarbamol **OR** methocarbamol (ROBAXIN) IV, metoCLOPramide **OR** metoCLOPramide (REGLAN) injection, morphine injection, ondansetron **OR** ondansetron (ZOFRAN) IV, mouth rinse, oxyCODONE, polyethylene glycol   I have personally reviewed following labs and imaging studies  LABORATORY DATA: CBC: Recent Labs  Lab 04/20/23 1208 04/21/23 0837 04/21/23 2238 04/22/23 0532 04/23/23 0408 04/25/23 0409  WBC 5.4 6.5  --  8.4 7.3 13.8*  NEUTROABS 4.1  --   --   --   --   --   HGB 7.1* 6.7* 7.8* 7.3* 7.2* 9.0*  HCT 22.8* 21.9* 25.1* 23.1* 22.9* 28.9*  MCV 89.8 92.0  --  90.2 90.9 89.5  PLT 216 230  --  237 220 306  Basic Metabolic Panel: Recent Labs   Lab 04/22/23 0532 04/23/23 0408 04/24/23 1505 04/25/23 0409 04/26/23 0330  NA 126* 126* 132* 128* 130*  K 4.0 4.2 3.5 4.3 4.3  CL 95* 96* 103 96* 94*  CO2 24 23 20* 23 23  GLUCOSE 94 92 100* 141* 112*  BUN 8 8 7 9 16   CREATININE 0.60* 0.51* 0.54* 0.65 0.62  CALCIUM 7.1* 7.1* 6.0* 7.6* 7.6*  MG  --   --   --  2.1  --     GFR: Estimated Creatinine Clearance: 133.9 mL/min (by C-G formula based on SCr of 0.62 mg/dL).  Liver Function Tests: Recent Labs  Lab 04/25/23 0409  AST 13*  ALT 14  ALKPHOS 105  BILITOT 0.1*  PROT 7.0  ALBUMIN <1.5*   No results for input(s): "LIPASE", "AMYLASE" in the last 168 hours. No results for input(s): "AMMONIA" in the last 168 hours.  Coagulation Profile: No results for input(s): "INR", "PROTIME" in the last 168 hours.  Cardiac Enzymes: Recent Labs  Lab 04/26/23 0330  CKTOTAL 19*    BNP (last 3 results) No results for input(s): "PROBNP" in the last 8760 hours.  Lipid Profile: No results for input(s): "CHOL", "HDL", "LDLCALC", "TRIG", "CHOLHDL", "LDLDIRECT" in the last 72 hours.  Thyroid Function Tests: No results for input(s): "TSH", "T4TOTAL", "FREET4", "T3FREE", "THYROIDAB" in the last 72 hours.  Anemia Panel: No results for input(s): "VITAMINB12", "FOLATE", "FERRITIN", "TIBC", "IRON", "RETICCTPCT" in the last 72 hours.  Urine analysis:    Component Value Date/Time   COLORURINE AMBER (A) 04/09/2023 0016   APPEARANCEUR TURBID (A) 04/09/2023 0016   APPEARANCEUR Hazy 05/08/2013 0330   LABSPEC 1.041 (H) 04/09/2023 0016   LABSPEC 1.034 05/08/2013 0330   PHURINE 8.0 04/09/2023 0016   GLUCOSEU NEGATIVE 04/09/2023 0016   GLUCOSEU Negative 05/08/2013 0330   HGBUR NEGATIVE 04/09/2023 0016   BILIRUBINUR NEGATIVE 04/09/2023 0016   BILIRUBINUR 1+ 05/08/2013 0330   KETONESUR NEGATIVE 04/09/2023 0016   PROTEINUR 100 (A) 04/09/2023 0016   NITRITE NEGATIVE 04/09/2023 0016   LEUKOCYTESUR LARGE (A) 04/09/2023 0016   LEUKOCYTESUR  Negative 05/08/2013 0330    Sepsis Labs: Lactic Acid, Venous    Component Value Date/Time   LATICACIDVEN 2.1 (HH) 04/08/2023 2219    MICROBIOLOGY: Recent Results (from the past 240 hour(s))  Culture, blood (Routine X 2) w Reflex to ID Panel     Status: Abnormal   Collection Time: 04/18/23  8:06 AM   Specimen: BLOOD RIGHT ARM  Result Value Ref Range Status   Specimen Description BLOOD RIGHT ARM  Final   Special Requests   Final    BOTTLES DRAWN AEROBIC AND ANAEROBIC Blood Culture adequate volume   Culture  Setup Time   Final    GRAM POSITIVE COCCI IN CHAINS ANAEROBIC BOTTLE ONLY CRITICAL VALUE NOTED.  VALUE IS CONSISTENT WITH PREVIOUSLY REPORTED AND CALLED VALUE.    Culture (A)  Final    PEPTOSTREPTOCOCCUS SPECIES BACTEROIDES STERCORIS BETA LACTAMASE POSITIVE CRITICAL RESULT CALLED TO, READ BACK BY AND VERIFIED WITH: Antoine Primas PHARMD, AT 1323 04/22/23 Renato Shin Performed at [facility], [address] [place], [place] [address]    Report Status 04/22/2023 FINAL  Final  Culture, blood (Routine X 2) w Reflex to ID Panel     Status: None   Collection Time: 04/18/23  8:10 AM   Specimen: BLOOD LEFT HAND  Result Value Ref Range Status   Specimen Description BLOOD LEFT HAND  Final  Special Requests   Final    BOTTLES DRAWN AEROBIC ONLY Blood Culture adequate volume   Culture   Final    NO GROWTH 5 DAYS Performed at [facility], [address] [place], [place] [address]    Report Status 04/23/2023 FINAL  Final  Surgical pcr screen     Status: None   Collection Time: 04/24/23  9:15 AM   Specimen: Nasal Mucosa; Nasal Swab  Result Value Ref Range Status   MRSA, PCR NEGATIVE NEGATIVE Final   Staphylococcus aureus NEGATIVE NEGATIVE Final    Comment: (NOTE) The Xpert SA Assay (FDA approved for NASAL specimens in patients 38 years of age and older), is one component of a comprehensive surveillance program. It is not intended to diagnose infection nor to guide or  monitor treatment. Performed at [facility], [address], [place], [place] [address]   Aerobic/Anaerobic Culture w Gram Stain (surgical/deep wound)     Status: None (Preliminary result)   Collection Time: 04/24/23 11:16 AM   Specimen: Wound; Tissue  Result Value Ref Range Status   Specimen Description TISSUE LEFT HIP  Final   Special Requests PT ON VANC, ANCEF  Final   Gram Stain   Final    NO WBC SEEN FEW GRAM NEGATIVE RODS RARE GRAM POSITIVE COCCI    Culture   Final    MODERATE PROTEUS MIRABILIS CULTURE REINCUBATED FOR BETTER GROWTH Performed at [facility], [address] [place], [place] [address]    Report Status PENDING  Incomplete  Aerobic/Anaerobic Culture w Gram Stain (surgical/deep wound)     Status: None (Preliminary result)   Collection Time: 04/24/23 11:18 AM   Specimen: Wound; Tissue  Result Value Ref Range Status   Specimen Description TISSUE RIGHT HIP  Final   Special Requests PT ON ANCEF,VANC  Final   Gram Stain   Final    ABUNDANT WBC PRESENT, PREDOMINANTLY PMN RARE GRAM POSITIVE COCCI ABUNDANT GRAM NEGATIVE RODS Performed at [facility], [address] [place], [place] [address]    Culture MODERATE PROTEUS MIRABILIS  Final   Report Status PENDING  Incomplete    RADIOLOGY STUDIES/RESULTS: No results found.   LOS: 17 days   Jeoffrey Massed, MD  [facility]    To contact the attending provider between 7A-7P or the covering provider during after hours 7P-7A, please log into the web site [email or web address] and access using universal [place] password for that web site. If you do not have the password, please call the hospital operator.  04/26/2023, 10:28 AM

## 2023-04-26 NOTE — Progress Notes (Signed)
TR[facility] night cross cover note:   I was notified by RN of the patient's hypothermia. Rectal temp earlier during this shift noted to 93.5, subsequently improving slightly with interval bair hugger to 95.6 F. This is relative to hypothermia noted on day shift, with associated T min noted to be 91.7 around 10 AM on 8/8.   BP also noted to be slightly lower now, with most recent BP noted to be 87/55 with a MAP of 64 mmHg. This is relative to systolic BP's in the 90's mmHg during day shift. Patient is on scheduled midodrine, with most recent dose administered around 2230. He had also received a dose of iv morphine around 2240. He is mentating well with good urine output, nothing urine output of 1.1 L over the last 5 hours of tonight's shift.   Per my chart review there is documentation that the patient does develop intermittent hypotension over the course of this hospitalization, with associated recommendation to manage with IV albumin.   Consequently, I ordered Albumin 25% 25 g iv x 1 dose, with plan to monitor ensuing bp trend in response to this intervention.     Newton Pigg, DO Hospitalist

## 2023-04-26 NOTE — Plan of Care (Signed)
  Problem: Clinical Measurements: Goal: Respiratory complications will improve Outcome: Progressing   Problem: Elimination: Goal: Will not experience complications related to bowel motility Outcome: Progressing   Problem: Health Behavior/Discharge Planning: Goal: Ability to manage health-related needs will improve Outcome: Not Progressing   Problem: Clinical Measurements: Goal: Ability to maintain clinical measurements within normal limits will improve Outcome: Not Progressing Goal: Will remain free from infection Outcome: Not Progressing   Problem: Activity: Goal: Risk for activity intolerance will decrease Outcome: Not Progressing   Problem: Pain Managment: Goal: General experience of comfort will improve Outcome: Not Progressing

## 2023-04-26 NOTE — Progress Notes (Signed)
Nutrition Follow-up  DOCUMENTATION CODES:   Not applicable  INTERVENTION:  Juven BID, each packet provides 95 calories, 2.5 grams of protein (collagen), and 9.8 grams of carbohydrate (3 grams sugar); also contains 7 grams of L-arginine and L-glutamine, 300 mg vitamin C, 15 mg vitamin E, 1.2 mcg vitamin B-12, 9.5 mg zinc, 200 mg calcium, and 1.5 g  Calcium Beta-hydroxy-Beta-methylbutyrate to support wound healing Ensure Enlive po TID, each supplement provides 350 kcal and 20 grams of protein. Encourage good PO intake  Snacks - BID Multivitamin w/ minerals daily  NUTRITION DIAGNOSIS:   Inadequate oral intake related to decreased appetite as evidenced by per patient/family report. - Progressing   GOAL:   Patient will meet greater than or equal to 90% of their needs - Ongoing   MONITOR:   PO intake, Supplement acceptance, Labs, I & O's, Skin  REASON FOR ASSESSMENT:   Rounds (significant wounds)    ASSESSMENT:   Pt with hx of paraplegia, chronic pelvic osteomyelitis, paranoid schizophrenia, drug abuse, and PTSD presented to ED with several days of abdominal pain and purulent drainage from wounds.Found to be in septic shock  7/22 - Admitted  7/24 - transferred to the floor 8/07 - Op, s/p I&D bilateral hip  Discussed with team in rounds, pt to remain inpatient due to need for IV antibiotics. RD attempted to visit with pt x2; pt with therapy or receiving care from RN/NT. Spoke with RN. Pt eating ok, was waiting on lunch to arrive. Had accepted Juven and Ensure from RN today.   Per MAR, pt accepting most Ensure's offered, but was not accepting the Juven.   Meal Intake 7/30-8/7: 0-100% x 8 meals (average 55%)   Medications reviewed and include: Vitamin C, Vitamin D3, Colace, Lasix, Flagyl, MVI, Protonix, Sodium Chloride, Vitamin A, Vitamin E, IV antibiotics  Labs reviewed: Sodium 130, Potassium 4.3, BUN 16, Creatinine 0.62,   Micronutrient Profile: CRP 11 (7/24) Vitamin A 7.5  (low) Vitamin C 0.3 (low) Vitamin D 23.23 (low) Vitamin E alpha 6.1, gamma 0.9 (WNL) Zinc 31 (low) Folate 8.6 (WNL) Vitamin B12 284 (WNL)  UOP: 3550 mL x 24 hrs   Diet Order:   Diet Order             Diet regular Room service appropriate? Yes; Fluid consistency: Thin  Diet effective now                   EDUCATION NEEDS:   No education needs have been identified at this time  Skin:    Per WOC assessement 7/23: Full thickness wounds: - Right posterior knee 2 x 2 x 0.1cm - Right anterior knee 0.5 x 0.5 x 0.1cm and 5 x 3 x 0.2 cm - Right lower leg 3 x 2 x 0.2 cm - Left elbow 0.8 x 0.8 x 0.1 cm - Left posterior arm 4 x 2 x 0.1 cm - Left posterior knee 3 x 3 cm - Left anterior knee 7 x 7 x 0.2 cm - Left posterior calf - Left anterior foot 4 x 3 x 0.2 cm and 1.5 x 1.5 cm Unstageable Pressure Injury - Left outer ankle 4 x 4 cm  Stage 4 Pressure Injury - Left ischial tuberosity (5 x 4 x 5 cm) - Right Ischial tuberosity  Last BM:  8/8 - Type 2  Height:   Ht Readings from Last 1 Encounters:  04/24/23 5\' 11"  (1.803 m)   Weight:  Wt Readings from Last 1 Encounters:  04/24/23 72.1 kg   Ideal Body Weight:  70.4 kg (adjusted by 10% for paralysis)  BMI:  Body mass index is 22.17 kg/m.  Estimated Nutritional Needs:  Kcal:  2200-2400 kcal/d Protein:  115-130 g/d Fluid:  >/=2.2L/d   Kirby Crigler RD, LDN Clinical Dietitian See [facility] for contact information.

## 2023-04-26 NOTE — Progress Notes (Signed)
Occupational Therapy Treatment Patient Details Name: Steve [name] MRN: [id number] DOB: 1990-12-24 Today's Date: 04/26/2023   History of present illness Pt is 32 yo male presenting to [facility] on 7/22 with reprots of weakness and malaise. Pt was found to have polymicrobial bacteremia. PMH: paraplegia, psychostimulant induced psychosis, neurogenic bladder, chronic hypotension on midodrine, chronic osteomyelitis, paranoid schizophrenia and PTSD. Pt recently admitted for infections to [facility] of bil ischial wounds.   OT comments  Patient received in supine and agreeable to OT session. Patient able to get to EOB with increased time and supervision. Patient performed grooming tasks seated on EOB with assistance for toothpaste and asked to return to supine due to fatigue and back pain. Patient instructed in BUE HEP with yellow therapy band for LUE and red for RUE. Patient will benefit from continued inpatient follow up therapy, <3 hours/day to address LB bathing and dressing and functional transfers. Acute OT to continue to follow.       If plan is discharge home, recommend the following:  Two people to help with walking and/or transfers;A lot of help with bathing/dressing/bathroom;Assistance with cooking/housework;Direct supervision/assist for medications management;Assist for transportation;Help with stairs or ramp for entrance   Equipment Recommendations  Other (comment) (defer to next level of care)    Recommendations for Other Services      Precautions / Restrictions Precautions Precautions: Fall Restrictions Weight Bearing Restrictions: No       Mobility Bed Mobility Overal bed mobility: Needs Assistance Bed Mobility: Supine to Sit, Sit to Supine     Supine to sit: Supervision Sit to supine: Min assist   General bed mobility comments: assistance with BLEs to return to supine    Transfers                         Balance Overall balance assessment: Needs  assistance Sitting-balance support: Single extremity supported Sitting balance-Leahy Scale: Fair Sitting balance - Comments: performed grooming seated on EOB with supervision for balance                                   ADL either performed or assessed with clinical judgement   ADL Overall ADL's : Needs assistance/impaired     Grooming: Wash/dry hands;Wash/dry face;Oral care;Minimal assistance;Sitting Grooming Details (indicate cue type and reason): performed seated on EOB with assistance for toothpaste                                    Extremity/Trunk Assessment              Vision       Perception     Praxis      Cognition Arousal: Alert Behavior During Therapy: WFL for tasks assessed/performed Overall Cognitive Status: Within Functional Limits for tasks assessed                                 General Comments: asking when he could go home        Exercises Exercises: General Upper Extremity General Exercises - Upper Extremity Shoulder Horizontal ABduction: Strengthening, Both, 10 reps, Theraband Theraband Level (Shoulder Horizontal Abduction): Level 1 (Yellow), Level 2 (Red) (LUE yellow, RUE red) Elbow Flexion: Strengthening, Both, 20 reps, Theraband (2 sets) Theraband Level (Elbow  Flexion): Level 1 (Yellow), Level 2 (Red) (LUE yellow, RUE red) Elbow Extension: Strengthening, Both, 20 reps, Theraband (2 sets) Theraband Level (Elbow Extension): Level 1 (Yellow), Level 2 (Red) (yellow LUE, RUE red)    Shoulder Instructions       General Comments      Pertinent Vitals/ Pain       Pain Assessment Pain Assessment: Faces Faces Pain Scale: Hurts little more Pain Location: back Pain Descriptors / Indicators: Grimacing Pain Intervention(s): Limited activity within patient's tolerance, Monitored during session, Repositioned  Home Living                                          Prior  Functioning/Environment              Frequency  Min 1X/week        Progress Toward Goals  OT Goals(current goals can now be found in the care plan section)  Progress towards OT goals: Progressing toward goals  Acute Rehab OT Goals Patient Stated Goal: go home OT Goal Formulation: With patient Time For Goal Achievement: 05/01/23 Potential to Achieve Goals: Good ADL Goals Pt Will Perform Lower Body Bathing: with mod assist;bed level Pt Will Perform Lower Body Dressing: with min assist;bed level Pt/caregiver will Perform Home Exercise Program: Both right and left upper extremity;With theraband;Independently;With written HEP provided  Plan      Co-evaluation                 AM-PAC OT "6 Clicks" Daily Activity     Outcome Measure   Help from another person eating meals?: None Help from another person taking care of personal grooming?: A Little Help from another person toileting, which includes using toliet, bedpan, or urinal?: A Lot Help from another person bathing (including washing, rinsing, drying)?: A Lot Help from another person to put on and taking off regular upper body clothing?: A Little Help from another person to put on and taking off regular lower body clothing?: A Lot 6 Click Score: 16    End of Session    OT Visit Diagnosis: Other abnormalities of gait and mobility (R26.89);Pain;Other (comment) Pain - part of body:  (back)   Activity Tolerance Patient limited by fatigue;Patient limited by pain   Patient Left in bed;with call bell/phone within reach   Nurse Communication Mobility status        Time: 1346-1410 OT Time Calculation (min): 24 min  Charges: OT General Charges $OT Visit: 1 Visit OT Treatments $Self Care/Home Management : 8-22 mins $Therapeutic Exercise: 8-22 mins  Alfonse Flavors, OTA Acute Rehabilitation Services  Office [phone]   Steve [name] 04/26/2023, 2:23 PM

## 2023-04-26 NOTE — Plan of Care (Signed)
  Problem: Health Behavior/Discharge Planning: Goal: Ability to manage health-related needs will improve Outcome: Progressing   

## 2023-04-27 DIAGNOSIS — R6521 Severe sepsis with septic shock: Secondary | ICD-10-CM | POA: Diagnosis not present

## 2023-04-27 DIAGNOSIS — A419 Sepsis, unspecified organism: Secondary | ICD-10-CM | POA: Diagnosis not present

## 2023-04-27 LAB — BASIC METABOLIC PANEL WITH GFR
Anion gap: 11 (ref 5–15)
BUN: 16 mg/dL (ref 6–20)
CO2: 25 mmol/L (ref 22–32)
Calcium: 7.6 mg/dL — ABNORMAL LOW (ref 8.9–10.3)
Chloride: 95 mmol/L — ABNORMAL LOW (ref 98–111)
Creatinine, Ser: 0.61 mg/dL (ref 0.61–1.24)
GFR, Estimated: >60 mL/min
Glucose, Bld: 96 mg/dL (ref 70–99)
Potassium: 4.5 mmol/L (ref 3.5–5.1)
Sodium: 131 mmol/L — ABNORMAL LOW (ref 135–145)

## 2023-04-27 LAB — BRAIN NATRIURETIC PEPTIDE: B Natriuretic Peptide: 94.7 pg/mL (ref 0.0–100.0)

## 2023-04-27 LAB — CORTISOL-AM, BLOOD: Cortisol - AM: 9.3 ug/dL (ref 6.7–22.6)

## 2023-04-27 LAB — CBC
HCT: 25.7 % — ABNORMAL LOW (ref 39.0–52.0)
Hemoglobin: 7.8 g/dL — ABNORMAL LOW (ref 13.0–17.0)
MCH: 28 pg (ref 26.0–34.0)
MCHC: 30.4 g/dL (ref 30.0–36.0)
MCV: 92.1 fL (ref 80.0–100.0)
Platelets: 334 K/uL (ref 150–400)
RBC: 2.79 MIL/uL — ABNORMAL LOW (ref 4.22–5.81)
RDW: 16 % — ABNORMAL HIGH (ref 11.5–15.5)
WBC: 7.7 K/uL (ref 4.0–10.5)
nRBC: 0 % (ref 0.0–0.2)

## 2023-04-27 MED ORDER — SODIUM CHLORIDE 0.9 % IV SOLN
2.0000 g | Freq: Three times a day (TID) | INTRAVENOUS | Status: DC
  Administered 2023-04-27 – 2023-04-29 (×6): 2 g via INTRAVENOUS
  Filled 2023-04-27 (×6): qty 12.5

## 2023-04-27 MED ORDER — ALBUMIN HUMAN 25 % IV SOLN
25.0000 g | Freq: Once | INTRAVENOUS | Status: AC
  Administered 2023-04-27: 25 g via INTRAVENOUS
  Filled 2023-04-27: qty 100

## 2023-04-27 MED ORDER — MIDODRINE HCL 5 MG PO TABS
5.0000 mg | ORAL_TABLET | Freq: Three times a day (TID) | ORAL | Status: DC
  Administered 2023-04-27 – 2023-04-28 (×3): 5 mg via ORAL
  Filled 2023-04-27 (×3): qty 1

## 2023-04-27 NOTE — Progress Notes (Signed)
TRH night cross cover note:   I was notified by RN that BP is running slightly lower now, with most recent systolic blood pressures in the 80s mmHg, relative to the 90s mmHg during the afternoon of day shift.  Other vital signs at this time include mild tachycardia, consistent with heart rates on day shift, afebrile, with temperature of 98.9.   Patient without any acute complaint at this time and is mentating well.  Patient is on scheduled midodrine, with next dose scheduled to occur at 2200 this evening.   Per my chart review there is documentation that the patient does develop intermittent hypotension over the course of this hospitalization, with associated recommendation to manage with IV albumin.    Consequently, I ordered Albumin 25% 25 g iv x 1 dose, with plan to monitor ensuing bp trend in response to this intervention. I've also asked that his scheduled dose of midodrine 5 mg p.o., which is set to occur at 2200 this evening, be given early at this time.     Newton Pigg, DO Hospitalist

## 2023-04-27 NOTE — Progress Notes (Signed)
Pharmacy Antibiotic Note  Steve [name] is a 32 y.o. male admitted on 04/08/2023 with osteomyelitis and previous MRSA history. 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 Pharmacy has been consulted for cefepime dosing; also on daptomycin and metronidazole.   Plan: Cefepime 2 g IV q8h  Temp (24hrs), Avg:98.5 F (36.9 C), Min:98.3 F (36.8 C), Max:98.9 F (37.2 C)  Recent Labs  Lab 04/21/23 0837 04/22/23 0532 04/23/23 0408 04/24/23 1505 04/25/23 0409 04/26/23 0330 04/27/23 0430  WBC 6.5 8.4 7.3  --  13.8*  --  7.7  CREATININE 0.64 0.60* 0.51* 0.54* 0.65 0.62 0.61    Estimated Creatinine Clearance: 133.9 mL/min (by C-G formula based on SCr of 0.61 mg/dL).    Allergies  Allergen Reactions   Caffeine Anaphylaxis   Chocolate Anaphylaxis   Bactrim [Sulfamethoxazole-Trimethoprim] Other (See Comments)    Reaction not listed.   Tuberculin, Ppd     Other reaction(s): Eruption of skin   Sulfa Antibiotics Rash    Antimicrobials this admission: 7/24 ceftrx >> 8/10 7/25 dapto >> (9/4) Flagyl 8/5 >> 7/22 vanc > 7/25 7/22 zosyn >> 7/24 Nystatin powder 7/27 >> Cefepime 8/10 >>  Dose adjustments this admission: Vancomycin 1000mg  Q12h > 1750mg  Q12h  Microbiology results: 7/24 Bcx: neg 7/22 bcx: MRSA & Group C strep & proteus 7/23 UC: >100,000 Proteus mirabilis (not treating per ID) 8/1 repeat BCx - peptostreptococcus, bacteroides stercoris 8/7 deep wound Cx of left hip - proteus mirabilis (panS), e coli (S-ctx), providencia stuartii (S-ctx) 8/7 deep wound Cx of right hip - proteus mirabilis (panS), acinetobacter baumannii (panS), enterococcus avium (panS)  Thank you for involving pharmacy in this patient's care.  Loura Back, PharmD, BCPS Clinical Pharmacist Clinical phone for 04/27/2023 is [phone] 04/27/2023 6:28 PM

## 2023-04-27 NOTE — Plan of Care (Signed)

## 2023-04-27 NOTE — Progress Notes (Addendum)
PROGRESS NOTE        PATIENT DETAILS Name: Steve [name] Age: 32 y.o. Sex: male Date of Birth: 02-09-1991 Admit Date: 04/08/2023 Admitting Physician Briant Sites, DO NWG:[id number], [facility]  Brief Summary: 33 year old male with with history of paraplegia-known sacral decubitus ulcers with chronic osteomyelitis, substance abuse, chronic hypotension on midodrine-presented with septic shock in the setting of polymicrobial bacteremia and acute on chronic osteomyelitis of sacral decubitus ulcers and left hip septic arthritis.  Initially admitted to the ICU-stabilized and subsequently transferred to [facility].  Evaluated by orthopedics-no surgical interventions recommended.  Evaluated by ID-with plans for 6 weeks of IV daptomycin/Rocephin.  Unfortunately-further hospital course complicated by intermittent fevers-upon further evaluation with MRI pelvis-he was found to have abscess.  See below for further details.     Social worker following for placement-as patient is not a candidate for outpatient IV antibiotics (history of drug use)Patient is a 32 y.o.  male    Significant studies: 7/22>> CT abdomen/pelvis: Septic arthritis left hip with osteomyelitis of left ischium/inferior pubic rami.  Posterior left hip dislocation. 7/23>> TTE: EF 60-65% 8/02>> CT pelvis: Unchanged left hip dislocation-chronic osteomyelitis left pelvis related to ischemic ulcer, possible ill-defined fluid collection adjacent to the ulcer. 8/02>> bilateral lower extremity Doppler: No DVT 8/03>> MRI pelvis: Rim enhancing collection posterior right proximal femur concerning for abscess, multiple small abscesses around proximal left femur  Significant microbiology data: 7/22>> blood culture: Staph aureus, group C streptococcus, Proteus 7/23>> blood culture: Proteus/E. Coli 7/24>> blood culture: No growth 8/01>> blood culture: Peptostreptococcus, Bacteroides 8/01>> blood culture: No  growth 8/07>> intraoperative tissue culture right hip: Proteus Mirabilis 8/07>> intraoperative tissue culture left hip: Proteus mirabilis  Procedures: 7/29>> PICC by IR 8/07>> bilateral hip excisional debridement  Consults: PCCM ID Ortho  Subjective:  Patient in bed, appears comfortable, denies any headache, no fever, no chest pain or pressure, no shortness of breath , no abdominal pain. No new focal weakness.   Objective: Vitals: Blood pressure 111/64, pulse 90, temperature 98.3 F (36.8 C), temperature source Oral, resp. rate 18, height 5\' 11"  (1.803 m), weight 72.1 kg, SpO2 99%.   Exam:  Awake Alert, diffuse weakness, functional quadriplegia, R. 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 S/p surgical debridement of bilateral hips on 8/7 (intraoperative cultures positive for Proteus).  TSH on 8/1 stable, a.m. cortisol in 8/2 stable as well.  If hypothermia persists-repeat a.m. cortisol tomorrow. ID following-remains on daptomycin/ceftriaxone/metronidazole, defer end dates and antibiotic regimen to ID.   Use warming blanket as needed for mild hypothermia, stays uncovered in the bed, counseled, keep room warm, TSH and random cortisol stable. Per ID-TEE not needed as patient will be on a prolonged course of antibiotics.  Note-not a candidate for home IV infusion therapies-Per social work-patient has been  rejected by the [place] system-and hence will remain inpatient here  at [facility] to complete course of IV antibiotics.   Acute on chronic normocytic anemia  Secondary to acute illness-superimposed on anemia of chronic disease Continue to follow CBC-last transfusion on 7/29.   Hypotension Am cortisol on 8/2 was > 20 BP is responding well to midodrine, titrate down the dose gently and monitor   Hyponatremia Due to excessive fluid intake Fluid restrict Salt tablets Low-dose Lasix starting 8/6-will change to every other day dosing-as he was hypotensive overnight. Follow electrolytes-sodium levels fluctuate quite a bit   Normocytic anemia Due to acute illness/chronic inflammation from sacral osteomyelitis/ulcers Continue to Hb-required PRBC transfusion on 8/4.   GERD PPI   Vitamin D deficiency continue with supplements   Vitamin B12 deficiency Continue supplementation   Vitamin A, C, D deficiency, zinc deficiency Continue with supplements   Neurogenic bladder/UTI Paraplegia Continue Foley, urine culture growing Proteus, continue with Rocephin   Schizophrenia/PTSD Seems stable Continue Abilify   Substance abuse  Self reports use of methamphetamines    3 mm left solid pulmonary nodule Further workup deferred to outpatient setting-as this is an incidental finding  R Amrm PICC   Nutrition Status: Nutrition Problem: Inadequate oral intake Etiology: decreased appetite Signs/Symptoms: per patient/family report Interventions: Ensure Enlive (each supplement provides 350kcal and 20 grams of protein), MVI, Juven, Snacks    Pressure Ulcer: Pressure Injury 05/30/21 Ankle Left;Lateral Unstageable - Full thickness tissue loss in which the base of the injury is covered by slough (yellow, tan, gray, green or brown) and/or eschar (tan, brown or black) in the wound bed. (Active)  05/30/21 1938  Location: Ankle  Location Orientation: Left;Lateral  Staging: Unstageable - Full  thickness tissue loss in which the base of the injury is covered by slough (yellow, tan, gray, green or brown) and/or eschar (tan, brown or black) in the wound bed.  Wound Description (Comments):   Present on Admission: Yes  Dressing Type Foam - Lift dressing to assess site every shift 04/26/23 0800     Pressure Injury 08/29/21 Ischial tuberosity Left Stage 4 - Full thickness tissue loss with exposed bone, tendon or muscle. [id number] (Active)  08/29/21 0100  Location: Ischial tuberosity  Location Orientation: Left  Staging: Stage 4 - Full thickness tissue loss with exposed bone, tendon or muscle.  Wound Description (Comments): [id number]  Present on Admission: Yes  Dressing Type Gauze (Comment) 04/26/23 0800     Pressure Injury 04/09/23 Pretibial Right;Lateral Stage 2 -  Partial thickness loss of dermis presenting as a shallow open injury with a red, pink wound bed without slough. full thickness, NOT a pressure injury (Active)  04/09/23 0351  Location: Pretibial  Location Orientation: Right;Lateral  Staging: Stage 2 -  Partial thickness loss of dermis presenting as a shallow open injury with a red, pink wound bed without slough.  Wound Description (Comments): full thickness, NOT a pressure injury  Present on Admission: Yes  Dressing Type Foam - Lift dressing to assess site every shift 04/26/23 2144     Pressure Injury 04/09/23 Ischial tuberosity Right Stage 4 - Full thickness tissue loss with exposed bone, tendon or muscle. (Active)  04/09/23 0100  Location: Ischial tuberosity  Location Orientation: Right  Staging: Stage 4 - Full thickness tissue loss with exposed bone, tendon or muscle.  Wound Description (Comments):   Present on Admission: Yes  Dressing Type Foam - Lift dressing to assess site every shift 04/26/23 2144     Pressure Injury 04/09/23 Elbow Left;Posterior Stage 2 -  Partial thickness loss  of dermis presenting as a shallow open injury with a red, pink wound bed without slough.  (Active)  04/09/23 0330  Location: Elbow  Location Orientation: Left;Posterior  Staging: Stage 2 -  Partial thickness loss of dermis presenting as a shallow open injury with a red, pink wound bed without slough.  Wound Description (Comments):   Present on Admission: Yes  Dressing Type Foam - Lift dressing to assess site every shift 04/26/23 0800    BMI : Estimated body mass index is 22.17 kg/m as calculated from the following:   Height as of this encounter: 5\' 11"  (1.803 m).   Weight as of this encounter: 72.1 kg.   Code status:   Code Status: Full Code   DVT Prophylaxis: SCDs Start: 04/24/23 1718 enoxaparin (LOVENOX) injection 40 mg Start: 04/16/23 2200 SCDs Start: 04/09/23 0140   Family Communication: None at bedside   Disposition Plan: Status is: Inpatient Remains inpatient appropriate because: Severity of illness   Planned Discharge Destination:Home if willing but will remain inpatient to complete approximately 6 weeks of IV antibiotics-as patient not a candidate for outpatient therapies.   Diet: Diet Order             Diet regular Room service appropriate? Yes; Fluid consistency: Thin  Diet effective now                   MEDICATIONS: Scheduled Meds:  ARIPiprazole  15 mg Oral Daily   ascorbic acid  500 mg Oral Daily   Chlorhexidine Gluconate Cloth  6 each Topical Daily   cholecalciferol  1,000 Units Oral Daily   cyanocobalamin  1,000 mcg Subcutaneous Q30 days   docusate sodium  100 mg Oral BID   enoxaparin (LOVENOX) injection  40 mg Subcutaneous Q24H   feeding supplement  237 mL Oral TID BM   furosemide  20 mg Oral QODAY   leptospermum manuka honey  1 Application Topical Daily   lidocaine (PF)  10 mL Infiltration Once   metroNIDAZOLE  500 mg Oral Q12H   midodrine  5 mg Oral Q8H   multivitamin with minerals  1 tablet Oral Daily   nutrition supplement (JUVEN)  1 packet Oral BID BM   nystatin   Topical TID   pantoprazole  40 mg Oral Daily   sodium  chloride  1 g Oral BID WC   Vashe Wound Irrigation Optime  34 oz Topical Once   vitamin A  10,000 Units Oral Daily   vitamin E  400 Units Oral Daily   Continuous Infusions:  sodium chloride     cefTRIAXone (ROCEPHIN)  IV 2 g (04/26/23 1146)   DAPTOmycin (CUBICIN) 600 mg in sodium chloride 0.9 % IVPB 600 mg (04/26/23 1414)   methocarbamol (ROBAXIN) IV     PRN Meds:.acetaminophen, alum & mag hydroxide-simeth, baclofen, bisacodyl, calcium carbonate, magnesium citrate, methocarbamol **OR** methocarbamol (ROBAXIN) IV, metoCLOPramide **OR** metoCLOPramide (REGLAN) injection, morphine injection, ondansetron **OR** ondansetron (ZOFRAN) IV, mouth rinse, oxyCODONE, polyethylene glycol   I have personally reviewed following labs and imaging studies  LABORATORY DATA:  Recent Labs  Lab 04/20/23 1208 04/21/23 0837 04/21/23 2238 04/22/23 0532 04/23/23 0408 04/25/23 0409 04/27/23 0430  WBC 5.4 6.5  --  8.4 7.3 13.8* 7.7  HGB 7.1* 6.7* 7.8* 7.3* 7.2* 9.0* 7.8*  HCT 22.8* 21.9* 25.1* 23.1* 22.9* 28.9* 25.7*  PLT 216 230  --  237 220 306 334  MCV 89.8 92.0  --  90.2 90.9 89.5 92.1  MCH 28.0 28.2  --  28.5 28.6 27.9 28.0  MCHC 31.1 30.6  --  31.6 31.4 31.1 30.4  RDW 16.5* 16.7*  --  16.3* 15.9* 15.8* 16.0*  LYMPHSABS 0.8  --   --   --   --   --   --   MONOABS 0.3  --   --   --   --   --   --   EOSABS 0.1  --   --   --   --   --   --   BASOSABS 0.0  --   --   --   --   --   --     Recent Labs  Lab 04/20/23 1208 04/21/23 0837 04/23/23 0408 04/24/23 1505 04/25/23 0409 04/26/23 0330 04/27/23 0430  NA 130*   < > 126* 132* 128* 130* 131*  K 4.1   < > 4.2 3.5 4.3 4.3 4.5  CL 94*   < > 96* 103 96* 94* 95*  CO2 26   < > 23 20* 23 23 25   ANIONGAP 10   < > 7 9 9 13 11   GLUCOSE 99   < > 92 100* 141* 112* 96  BUN 7   < > 8 7 9 16 16   CREATININE 0.70   < > 0.51* 0.54* 0.65 0.62 0.61  AST  --   --   --   --  13*  --   --   ALT  --   --   --   --  14  --   --   ALKPHOS  --   --   --   --  105   --   --   BILITOT  --   --   --   --  0.1*  --   --   ALBUMIN  --   --   --   --  <1.5*  --   --   PROCALCITON 3.83  --   --   --   --   --   --   BNP  --   --   --   --   --   --  94.7  MG  --   --   --   --  2.1  --  2.1  CALCIUM 7.4*   < > 7.1* 6.0* 7.6* 7.6* 7.6*   < > = values in this interval not displayed.    Recent Labs  Lab 04/20/23 1208 04/21/23 0837 04/23/23 0408 04/24/23 1505 04/25/23 0409 04/26/23 0330 04/27/23 0430  PROCALCITON 3.83  --   --   --   --   --   --   BNP  --   --   --   --   --   --  94.7  MG  --   --   --   --  2.1  --  2.1  CALCIUM 7.4*   < > 7.1* 6.0* 7.6* 7.6* 7.6*   < > = values in this interval not displayed.     Sepsis Labs: Lactic Acid, Venous    Component Value Date/Time   LATICACIDVEN 2.1 (HH) 04/08/2023 2219    MICROBIOLOGY: Recent Results (from the past 240 hour(s))  Culture, blood (Routine X 2) w Reflex to ID Panel     Status: Abnormal   Collection Time: 04/18/23  8:06 AM   Specimen: BLOOD RIGHT ARM  Result Value Ref Range Status   Specimen Description BLOOD RIGHT ARM  Final   Special Requests   Final    BOTTLES DRAWN AEROBIC AND ANAEROBIC Blood Culture adequate volume   Culture  Setup Time   Final    GRAM POSITIVE COCCI IN CHAINS ANAEROBIC BOTTLE ONLY CRITICAL VALUE NOTED.  VALUE IS CONSISTENT WITH PREVIOUSLY REPORTED AND CALLED VALUE.    Culture (A)  Final    PEPTOSTREPTOCOCCUS SPECIES BACTEROIDES STERCORIS BETA LACTAMASE POSITIVE CRITICAL RESULT CALLED TO, READ BACK BY AND VERIFIED WITH: Antoine Primas PHARMD, AT 1323 04/22/23 Renato Shin Performed at [facility], [address] [place], [place] [address]    Report Status 04/22/2023 FINAL  Final  Culture, blood (Routine X 2) w Reflex to ID Panel     Status: None   Collection Time: 04/18/23  8:10 AM   Specimen: BLOOD LEFT HAND  Result Value Ref Range Status   Specimen Description BLOOD LEFT HAND  Final   Special Requests   Final    BOTTLES DRAWN AEROBIC ONLY Blood  Culture adequate volume   Culture   Final    NO GROWTH 5 DAYS Performed at [facility], [address], [place], [place] [address]    Report Status 04/23/2023 FINAL  Final  Surgical pcr screen     Status: None   Collection Time: 04/24/23  9:15 AM   Specimen: Nasal Mucosa; Nasal Swab  Result Value Ref Range Status   MRSA, PCR NEGATIVE NEGATIVE Final   Staphylococcus aureus NEGATIVE NEGATIVE Final    Comment: (NOTE) The Xpert SA Assay (FDA approved for NASAL specimens in patients 69 years of age and older), is one component of a comprehensive surveillance program. It is not intended to diagnose infection nor to guide or monitor treatment. Performed at [facility], [address] [place], [place] [address]   Aerobic/Anaerobic Culture w Gram Stain (surgical/deep wound)     Status: None (Preliminary result)   Collection Time: 04/24/23 11:16 AM   Specimen: Wound; Tissue  Result Value Ref Range Status   Specimen Description TISSUE LEFT HIP  Final   Special Requests PT ON VANC, ANCEF  Final   Gram Stain   Final    NO WBC SEEN FEW GRAM NEGATIVE RODS RARE GRAM POSITIVE COCCI    Culture   Final    MODERATE PROTEUS MIRABILIS MODERATE GRAM NEGATIVE RODS CULTURE REINCUBATED FOR BETTER GROWTH Performed at [facility], [address], [place], [place] [address]    Report Status PENDING  Incomplete  Aerobic/Anaerobic Culture w Gram Stain (surgical/deep wound)     Status: None (Preliminary result)   Collection Time: 04/24/23 11:18 AM   Specimen: Wound; Tissue  Result Value Ref Range Status   Specimen Description TISSUE RIGHT HIP  Final   Special Requests PT ON ANCEF,VANC  Final   Gram Stain   Final    ABUNDANT WBC PRESENT, PREDOMINANTLY PMN RARE GRAM POSITIVE COCCI ABUNDANT GRAM NEGATIVE RODS    Culture   Final    MODERATE PROTEUS MIRABILIS MODERATE GRAM NEGATIVE RODS CULTURE REINCUBATED FOR BETTER GROWTH Performed at [facility], [address],  [place], [place] [address]    Report Status PENDING  Incomplete   Organism ID, Bacteria PROTEUS MIRABILIS  Final      Susceptibility   Proteus mirabilis - MIC*    AMPICILLIN <=2 SENSITIVE Sensitive     CEFEPIME <=0.12 SENSITIVE Sensitive     CEFTAZIDIME <=1 SENSITIVE Sensitive     CEFTRIAXONE <=0.25 SENSITIVE Sensitive  CIPROFLOXACIN <=0.25 SENSITIVE Sensitive     GENTAMICIN <=1 SENSITIVE Sensitive     IMIPENEM 2 SENSITIVE Sensitive     TRIMETH/SULFA <=20 SENSITIVE Sensitive     AMPICILLIN/SULBACTAM <=2 SENSITIVE Sensitive     PIP/TAZO <=4 SENSITIVE Sensitive     * MODERATE PROTEUS MIRABILIS    RADIOLOGY STUDIES/RESULTS: No results found.   LOS: 18 days   Signature  -    Susa Raring M.D on 04/27/2023 at 11:43 AM   -  To page go to [email or web address]

## 2023-04-27 NOTE — Progress Notes (Signed)
   04/27/23 2006  Assess: MEWS Score  Temp 98.2 F (36.8 C)  BP (!) 86/39  MAP (mmHg) (!) 53  Pulse Rate (!) 113  ECG Heart Rate (!) 113  Resp 18  Level of Consciousness Alert  SpO2 99 %  O2 Device Room Air  Assess: MEWS Score  MEWS Temp 0  MEWS Systolic 1  MEWS Pulse 2  MEWS RR 0  MEWS LOC 0  MEWS Score 3  MEWS Score Color Yellow  Assess: if the MEWS score is Yellow or Red  Were vital signs accurate and taken at a resting state? Yes  Does the patient meet 2 or more of the SIRS criteria? No  Does the patient have a confirmed or suspected source of infection? Yes  MEWS guidelines implemented  Yes, yellow  Treat  MEWS Interventions Considered administering scheduled or prn medications/treatments as ordered  Take Vital Signs  Increase Vital Sign Frequency  Yellow: Q2hr x1, continue Q4hrs until patient remains green for 12hrs  Escalate  MEWS: Escalate Yellow: Discuss with charge nurse and consider notifying provider and/or RRT  Notify: Charge Nurse/RN  Name of Charge Nurse/RN Notified Control and instrumentation engineer  Provider Notification  Provider Name/Title Dr.Howerter  Date Provider Notified 04/27/23  Time Provider Notified 2009  Method of Notification  (secure chat)  Notification Reason Change in status (low BP)  Provider response See new orders  Date of Provider Response 04/27/23  Time of Provider Response 2011  Assess: SIRS CRITERIA  SIRS Temperature  0  SIRS Pulse 1  SIRS Respirations  0  SIRS WBC 0  SIRS Score Sum  1

## 2023-04-27 NOTE — Progress Notes (Signed)
ID BRIEF NOTE  Specimen Description TISSUE RIGHT HIP  Special Requests PT ON ANCEF,VANC  Gram Stain ABUNDANT WBC PRESENT, PREDOMINANTLY PMN RARE GRAM POSITIVE COCCI ABUNDANT GRAM NEGATIVE RODS  Culture MODERATE PROTEUS MIRABILIS MODERATE ACINETOBACTER CALCOACETICUS/BAUMANNII COMPLEX FEW ENTEROCOCCUS AVIUM ABUNDANT BACTEROIDES SPECIES ABUNDANT PREVOTELLA DENTICOLA BETA LACTAMASE POSITIVE Performed at [facility], [address], [place], [place] [address]  Report Status 04/27/2023 FINAL  Organism ID, Bacteria PROTEUS MIRABILIS  Organism ID, Bacteria ACINETOBACTER CALCOACETICUS/BAUMANNII COMPLEX  Organism ID, Bacteria ENTEROCOCCUS AVIUM    Specimen Description TISSUE LEFT HIP  Special Requests PT ON VANC, ANCEF  Gram Stain NO WBC SEEN FEW GRAM NEGATIVE RODS RARE GRAM POSITIVE COCCI  Culture MODERATE PROTEUS MIRABILIS MODERATE ESCHERICHIA COLI MODERATE PROVIDENCIA STUARTII FEW ENTEROCOCCUS GALLINARUM VANCOMYCIN RESISTANT ENTEROCOCCUS ABUNDANT BACTEROIDES VULGATUS ABUNDANT PREVOTELLA DENTICOLA BETA LACTAMASE POSITIVE Performed at [facility], [address], [place], [place] [address]  Report Status 04/27/2023 FINAL  Organism ID, Bacteria PROTEUS MIRABILIS  Organism ID, Bacteria ESCHERICHIA COLI  Organism ID, Bacteria PROVIDENCIA STUARTII  Organism ID, Bacteria ENTEROCOCCUS GALLINARUM    -D/C ceftriaxone and start cefepime as it will cover providencia and acinetobacter

## 2023-04-28 DIAGNOSIS — A419 Sepsis, unspecified organism: Secondary | ICD-10-CM | POA: Diagnosis not present

## 2023-04-28 DIAGNOSIS — R6521 Severe sepsis with septic shock: Secondary | ICD-10-CM | POA: Diagnosis not present

## 2023-04-28 LAB — OSMOLALITY: Osmolality: 282 mOsm/kg (ref 275–295)

## 2023-04-28 LAB — BASIC METABOLIC PANEL
Anion gap: 6 (ref 5–15)
BUN: 12 mg/dL (ref 6–20)
CO2: 24 mmol/L (ref 22–32)
Calcium: 7.6 mg/dL — ABNORMAL LOW (ref 8.9–10.3)
Chloride: 98 mmol/L (ref 98–111)
Creatinine, Ser: 0.54 mg/dL — ABNORMAL LOW (ref 0.61–1.24)
GFR, Estimated: >60 mL/min (ref >60)
Glucose, Bld: 97 mg/dL (ref 70–99)
Potassium: 4.1 mmol/L (ref 3.5–5.1)
Sodium: 128 mmol/L — ABNORMAL LOW (ref 135–145)

## 2023-04-28 LAB — OSMOLALITY, URINE: Osmolality, Ur: 195 mOsm/kg — ABNORMAL LOW (ref 300–900)

## 2023-04-28 LAB — URIC ACID: Uric Acid, Serum: 3 mg/dL — ABNORMAL LOW (ref 3.7–8.6)

## 2023-04-28 LAB — BRAIN NATRIURETIC PEPTIDE: B Natriuretic Peptide: 66.4 pg/mL (ref 0.0–100.0)

## 2023-04-28 LAB — CREATININE, URINE, RANDOM: Creatinine, Urine: 15 mg/dL

## 2023-04-28 LAB — SODIUM, URINE, RANDOM: Sodium, Ur: 40 mmol/L

## 2023-04-28 LAB — MAGNESIUM: Magnesium: 2.1 mg/dL (ref 1.7–2.4)

## 2023-04-28 MED ORDER — COSYNTROPIN 0.25 MG IJ SOLR
0.2500 mg | Freq: Once | INTRAMUSCULAR | Status: AC
  Administered 2023-04-29: 0.25 mg via INTRAVENOUS
  Filled 2023-04-28: qty 0.25

## 2023-04-28 MED ORDER — MIDODRINE HCL 5 MG PO TABS
10.0000 mg | ORAL_TABLET | Freq: Three times a day (TID) | ORAL | Status: DC
  Administered 2023-04-28 – 2023-05-03 (×15): 10 mg via ORAL
  Filled 2023-04-28 (×15): qty 2

## 2023-04-28 MED ORDER — METHOCARBAMOL 500 MG PO TABS
500.0000 mg | ORAL_TABLET | Freq: Three times a day (TID) | ORAL | Status: DC | PRN
  Administered 2023-04-29: 500 mg via ORAL
  Filled 2023-04-28 (×2): qty 1

## 2023-04-28 MED ORDER — ALTEPLASE 2 MG IJ SOLR
2.0000 mg | Freq: Once | INTRAMUSCULAR | Status: AC
  Administered 2023-04-28: 2 mg
  Filled 2023-04-28: qty 2

## 2023-04-28 MED ORDER — BACLOFEN 5 MG HALF TABLET
5.0000 mg | ORAL_TABLET | Freq: Three times a day (TID) | ORAL | Status: DC | PRN
  Administered 2023-04-29: 5 mg via ORAL
  Filled 2023-04-28: qty 1

## 2023-04-28 MED ORDER — MORPHINE SULFATE (PF) 2 MG/ML IV SOLN
1.0000 mg | INTRAVENOUS | Status: DC | PRN
  Administered 2023-04-29 – 2023-04-30 (×2): 1 mg via INTRAVENOUS
  Filled 2023-04-28 (×2): qty 1

## 2023-04-28 NOTE — Progress Notes (Addendum)
PROGRESS NOTE        PATIENT DETAILS Name: Steve [name] Age: 32 y.o. Sex: male Date of Birth: March 16, 1991 Admit Date: 04/08/2023 Admitting Physician Briant Sites, DO ZOX:[id number], [facility]  Brief Summary: 32 year old male with with history of paraplegia-known sacral decubitus ulcers with chronic osteomyelitis, substance abuse, chronic hypotension on midodrine-presented with septic shock in the setting of polymicrobial bacteremia and acute on chronic osteomyelitis of sacral decubitus ulcers and left hip septic arthritis.  Initially admitted to the ICU-stabilized and subsequently transferred to [facility].  Evaluated by orthopedics-no surgical interventions recommended.  Evaluated by ID-with plans for 6 weeks of IV daptomycin/Rocephin.  Unfortunately-further hospital course complicated by intermittent fevers-upon further evaluation with MRI pelvis-he was found to have abscess.  See below for further details.     Social worker following for placement-as patient is not a candidate for outpatient IV antibiotics (history of drug use)Patient is a 32 y.o.  male    Significant studies: 7/22>> CT abdomen/pelvis: Septic arthritis left hip with osteomyelitis of left ischium/inferior pubic rami.  Posterior left hip dislocation. 7/23>> TTE: EF 60-65% 8/02>> CT pelvis: Unchanged left hip dislocation-chronic osteomyelitis left pelvis related to ischemic ulcer, possible ill-defined fluid collection adjacent to the ulcer. 8/02>> bilateral lower extremity Doppler: No DVT 8/03>> MRI pelvis: Rim enhancing collection posterior right proximal femur concerning for abscess, multiple small abscesses around proximal left femur  Significant microbiology data: 7/22>> blood culture: Staph aureus, group C streptococcus, Proteus 7/23>> blood culture: Proteus/E. Coli 7/24>> blood culture: No growth 8/01>> blood culture: Peptostreptococcus, Bacteroides 8/01>> blood culture: No  growth 8/07>> intraoperative tissue culture right hip: Proteus Mirabilis 8/07>> intraoperative tissue culture left hip: Proteus mirabilis  Procedures: 7/29>> PICC by IR 8/07>> bilateral hip excisional debridement  Consults: PCCM ID Ortho  Subjective: Patient in bed, appears comfortable, denies any headache, no fever, no chest pain or pressure, no shortness of breath , no abdominal pain. No focal weakness.   Objective: Vitals: Blood pressure 103/61, pulse (!) 101, temperature (!) 97.2 F (36.2 C), temperature source Oral, resp. rate 20, height 5\' 11"  (1.803 m), weight 72.1 kg, SpO2 100%.   Exam:  Awake Alert, diffuse weakness, functional quadriplegia, R. 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 S/p surgical debridement of bilateral hips on 8/7 (intraoperative cultures positive for Proteus).  TSH on 8/1 stable, a.m. cortisol in 8/2 stable as well.  If hypothermia persists-repeat a.m. cortisol tomorrow. ID following-remains on daptomycin/ceftriaxone/metronidazole, defer end dates and antibiotic regimen to ID.   Use warming blanket as needed for mild hypothermia, stays uncovered in the bed, counseled, keep room warm, TSH and random cortisol stable. Per ID-TEE not needed as patient will be on a prolonged course of antibiotics.  Note-not a candidate for home IV infusion therapies-Per social  work-patient has been rejected by the [place] system-and hence will remain  inpatient here at [facility] to complete course of IV antibiotics.    Acute on chronic normocytic anemia  Secondary to acute illness-superimposed on anemia of chronic disease Continue to follow CBC-last transfusion on 7/29.   Hypotension Am cortisol on 8/2 was > 20 but subsequently much lower, check a cosyntropin stimulation test 04/29/2023, midodrine and albumin for now.   Hyponatremia Due to excessive fluid intake, noncompliant with fluid restriction staff and patient both updated, urine osmolality and electrolytes along with serum osmolality ordered.   Normocytic anemia Due to acute illness/chronic inflammation from sacral osteomyelitis/ulcers Continue to Hb-required PRBC transfusion on 8/4.   GERD PPI   Vitamin D deficiency continue with supplements   Vitamin B12 deficiency Continue supplementation   Vitamin A, C, D deficiency, zinc deficiency Continue with supplements   Neurogenic bladder/UTI Paraplegia Continue Foley, urine culture growing Proteus, continue with Rocephin   Schizophrenia/PTSD Seems stable Continue Abilify   Substance abuse  Self reports use of methamphetamines    3 mm left solid pulmonary nodule Further workup deferred to outpatient setting-as this is an incidental finding  R Amrm PICC, Foley catheter   Nutrition Status: Nutrition Problem: Inadequate oral intake Etiology: decreased appetite Signs/Symptoms: per patient/family report Interventions: Ensure Enlive (each supplement provides 350kcal and 20 grams of protein), MVI, Juven, Snacks    Pressure Ulcer: Pressure Injury 05/30/21 Ankle Left;Lateral Unstageable - Full thickness tissue loss in which the base of the injury is covered by slough (yellow, tan, gray, green or brown) and/or eschar (tan, brown or black) in the wound bed. (Active)  05/30/21 1938  Location: Ankle  Location Orientation: Left;Lateral  Staging:  Unstageable - Full thickness tissue loss in which the base of the injury is covered by slough (yellow, tan, gray, green or brown) and/or eschar (tan, brown or black) in the wound bed.  Wound Description (Comments):   Present on Admission: Yes  Dressing Type Foam - Lift dressing to assess site every shift 04/27/23 2006     Pressure Injury 08/29/21 Ischial tuberosity Left Stage 4 - Full thickness tissue loss with exposed bone, tendon or muscle. [id number] (Active)  08/29/21 0100  Location: Ischial tuberosity  Location Orientation: Left  Staging: Stage 4 - Full thickness tissue loss with exposed bone, tendon or muscle.  Wound Description (Comments): [id number]  Present on Admission: Yes  Dressing Type Gauze (Comment) 04/27/23 1035     Pressure Injury 04/09/23 Pretibial Right;Lateral Stage 2 -  Partial thickness loss of dermis presenting as a shallow open injury with a red, pink wound bed without slough. full thickness, NOT a pressure injury (Active)  04/09/23 0351  Location: Pretibial  Location Orientation: Right;Lateral  Staging: Stage 2 -  Partial thickness loss of dermis presenting as a shallow open injury with a red, pink wound bed without slough.  Wound Description (Comments): full thickness, NOT a pressure injury  Present on Admission: Yes  Dressing Type Foam - Lift dressing to assess site every shift 04/27/23 2006     Pressure Injury 04/09/23 Ischial tuberosity Right Stage 4 - Full thickness tissue loss with exposed bone, tendon or muscle. (Active)  04/09/23 0100  Location: Ischial tuberosity  Location Orientation: Right  Staging: Stage 4 - Full thickness tissue loss with exposed bone, tendon or muscle.  Wound Description (Comments):   Present on Admission: Yes  Dressing Type Gauze (Comment) 04/27/23 1035     Pressure Injury 04/09/23 Elbow Left;Posterior Stage 2 -  Partial thickness loss of dermis presenting as a shallow open  injury with a red, pink wound bed without slough. (Active)   04/09/23 0330  Location: Elbow  Location Orientation: Left;Posterior  Staging: Stage 2 -  Partial thickness loss of dermis presenting as a shallow open injury with a red, pink wound bed without slough.  Wound Description (Comments):   Present on Admission: Yes  Dressing Type Foam - Lift dressing to assess site every shift 04/27/23 2006    BMI : Estimated body mass index is 22.17 kg/m as calculated from the following:   Height as of this encounter: 5\' 11"  (1.803 m).   Weight as of this encounter: 72.1 kg.   Code status:   Code Status: Full Code   DVT Prophylaxis: SCDs Start: 04/24/23 1718 enoxaparin (LOVENOX) injection 40 mg Start: 04/16/23 2200 SCDs Start: 04/09/23 0140   Family Communication: None at bedside   Disposition Plan: Status is: Inpatient Remains inpatient appropriate because: Severity of illness   Planned Discharge Destination:Home if willing but will remain inpatient to complete approximately 6 weeks of IV antibiotics-as patient not a candidate for outpatient therapies.   Diet: Diet Order             Diet regular Room service appropriate? Yes; Fluid consistency: Thin  Diet effective now                   MEDICATIONS: Scheduled Meds:  ARIPiprazole  15 mg Oral Daily   ascorbic acid  500 mg Oral Daily   Chlorhexidine Gluconate Cloth  6 each Topical Daily   cholecalciferol  1,000 Units Oral Daily   [START ON 04/29/2023] cosyntropin  0.25 mg Intravenous Once   cyanocobalamin  1,000 mcg Subcutaneous Q30 days   docusate sodium  100 mg Oral BID   enoxaparin (LOVENOX) injection  40 mg Subcutaneous Q24H   feeding supplement  237 mL Oral TID BM   leptospermum manuka honey  1 Application Topical Daily   lidocaine (PF)  10 mL Infiltration Once   metroNIDAZOLE  500 mg Oral Q12H   midodrine  10 mg Oral Q8H   multivitamin with minerals  1 tablet Oral Daily   nutrition supplement (JUVEN)  1 packet Oral BID BM   nystatin   Topical TID   pantoprazole  40 mg  Oral Daily   sodium chloride  1 g Oral BID WC   Vashe Wound Irrigation Optime  34 oz Topical Once   vitamin A  10,000 Units Oral Daily   vitamin E  400 Units Oral Daily   Continuous Infusions:  sodium chloride     ceFEPime (MAXIPIME) IV 2 g (04/28/23 0506)   DAPTOmycin (CUBICIN) 600 mg in sodium chloride 0.9 % IVPB 600 mg (04/27/23 1332)   methocarbamol (ROBAXIN) IV     PRN Meds:.acetaminophen, alum & mag hydroxide-simeth, baclofen, bisacodyl, calcium carbonate, magnesium citrate, methocarbamol **OR** methocarbamol (ROBAXIN) IV, metoCLOPramide **OR** metoCLOPramide (REGLAN) injection, morphine injection, ondansetron **OR** ondansetron (ZOFRAN) IV, mouth rinse, oxyCODONE, polyethylene glycol   I have personally reviewed following labs and imaging studies  LABORATORY DATA:  Recent Labs  Lab 04/21/23 2238 04/22/23 0532 04/23/23 0408 04/25/23 0409 04/27/23 0430  WBC  --  8.4 7.3 13.8* 7.7  HGB 7.8* 7.3* 7.2* 9.0* 7.8*  HCT 25.1* 23.1* 22.9* 28.9* 25.7*  PLT  --  237 220 306 334  MCV  --  90.2 90.9 89.5 92.1  MCH  --  28.5 28.6 27.9 28.0  MCHC  --  31.6 31.4 31.1 30.4  RDW  --  16.3*  15.9* 15.8* 16.0*    Recent Labs  Lab 04/24/23 1505 04/25/23 0409 04/26/23 0330 04/27/23 0430 04/28/23 0644  NA 132* 128* 130* 131* 128*  K 3.5 4.3 4.3 4.5 4.1  CL 103 96* 94* 95* 98  CO2 20* 23 23 25 24   ANIONGAP 9 9 13 11 6   GLUCOSE 100* 141* 112* 96 97  BUN 7 9 16 16 12   CREATININE 0.54* 0.65 0.62 0.61 0.54*  AST  --  13*  --   --   --   ALT  --  14  --   --   --   ALKPHOS  --  105  --   --   --   BILITOT  --  0.1*  --   --   --   ALBUMIN  --  <1.5*  --   --   --   BNP  --   --   --  94.7 66.4  MG  --  2.1  --  2.1 2.1  CALCIUM 6.0* 7.6* 7.6* 7.6* 7.6*    Recent Labs  Lab 04/24/23 1505 04/25/23 0409 04/26/23 0330 04/27/23 0430 04/28/23 0644  BNP  --   --   --  94.7 66.4  MG  --  2.1  --  2.1 2.1  CALCIUM 6.0* 7.6* 7.6* 7.6* 7.6*     Sepsis Labs: Lactic Acid,  Venous    Component Value Date/Time   LATICACIDVEN 2.1 (HH) 04/08/2023 2219    RADIOLOGY STUDIES/RESULTS: No results found.   LOS: 19 days   Signature  -    Susa Raring M.D on 04/28/2023 at 9:55 AM   -  To page go to [email or web address]

## 2023-04-28 NOTE — Plan of Care (Signed)

## 2023-04-28 NOTE — Plan of Care (Signed)

## 2023-04-29 ENCOUNTER — Inpatient Hospital Stay (HOSPITAL_COMMUNITY): Payer: No Typology Code available for payment source

## 2023-04-29 DIAGNOSIS — F1510 Other stimulant abuse, uncomplicated: Secondary | ICD-10-CM | POA: Diagnosis not present

## 2023-04-29 DIAGNOSIS — M0009 Staphylococcal polyarthritis: Secondary | ICD-10-CM | POA: Diagnosis not present

## 2023-04-29 DIAGNOSIS — A499 Bacterial infection, unspecified: Secondary | ICD-10-CM

## 2023-04-29 DIAGNOSIS — M86651 Other chronic osteomyelitis, right thigh: Secondary | ICD-10-CM | POA: Diagnosis not present

## 2023-04-29 DIAGNOSIS — M86659 Other chronic osteomyelitis, unspecified thigh: Secondary | ICD-10-CM | POA: Diagnosis present

## 2023-04-29 DIAGNOSIS — R7881 Bacteremia: Secondary | ICD-10-CM | POA: Diagnosis not present

## 2023-04-29 DIAGNOSIS — A419 Sepsis, unspecified organism: Secondary | ICD-10-CM | POA: Diagnosis not present

## 2023-04-29 DIAGNOSIS — R6521 Severe sepsis with septic shock: Secondary | ICD-10-CM | POA: Diagnosis not present

## 2023-04-29 LAB — GLUCOSE, CAPILLARY: Glucose-Capillary: 109 mg/dL — ABNORMAL HIGH (ref 70–99)

## 2023-04-29 MED ORDER — SULFAMETHOXAZOLE-TRIMETHOPRIM 800-160 MG PO TABS
2.0000 | ORAL_TABLET | Freq: Two times a day (BID) | ORAL | Status: DC
  Administered 2023-04-29 – 2023-05-03 (×8): 2 via ORAL
  Filled 2023-04-29 (×8): qty 2

## 2023-04-29 MED ORDER — SULFAMETHOXAZOLE-TRIMETHOPRIM 800-160 MG PO TABS
1.0000 | ORAL_TABLET | Freq: Once | ORAL | Status: AC
  Administered 2023-04-29: 1 via ORAL
  Filled 2023-04-29: qty 1

## 2023-04-29 MED ORDER — SODIUM CHLORIDE 0.9 % IV SOLN
3.0000 g | Freq: Four times a day (QID) | INTRAVENOUS | Status: DC
  Administered 2023-04-29 – 2023-05-03 (×16): 3 g via INTRAVENOUS
  Filled 2023-04-29 (×16): qty 8

## 2023-04-29 NOTE — Progress Notes (Addendum)
Physical Therapy Treatment Patient Details Name: Steve [name] MRN: [id number] DOB: 1991-01-04 Today's Date: 04/29/2023   History of Present Illness 32 year old male presented with septic shock in the setting of polymicrobial bacteremia and acute on chronic osteomyelitis of sacral decubitus ulcers and left hip septic arthritis.  Initially admitted to the ICU-stabilized and subsequently transferred to [facility].  Evaluated by orthopedics-no surgical interventions recommended. Evaluated by ID-with plans for 6 weeks of IV daptomycin/Rocephin.  Unfortunately-further hospital course complicated by intermittent fevers-upon further evaluation with MRI pelvis-he was found to have abscess.PMH: paraplegia-known sacral decubitus ulcers with chronic osteomyelitis, substance abuse, chronic hypotension on midodrine.    PT Comments  Pt received in sidesitting over L hip with fully flexed trunk, head covered in blanket, but awakens and uncovers head when PTA arriving to room. Pt noted to be in regular bed but would benefit more from air bed due to significant sacral and hip ulcers and pt with decreased recall of pressure relief frequency/strategies, RN/MD notified. Emphasis on safety with anterior/posterior wheelchair transfers, pressure relief techniques/freq, need to watch fluid intake (pt constantly asking for drinks/coffee but per MD he is on strict fluid restriction), and wheelchair safety/navigation. Pt eager to participate in wheelchair mobility out of the room but quick to fatigue, RN notified he is asking for MD clearance to wheel outside. Pt VSS on RA, but is c/o feeling cold may need temperature taken, warm blanket placed over him and pt awaiting RN for wound dressing change sidelying toward his R at end of session. Pt continues to benefit from PT services to progress toward functional mobility goals.    If plan is discharge home, recommend the following: Assistance with cooking/housework;Help with stairs or ramp  for entrance;A lot of help with walking and/or transfers;Assist for transportation   Can travel by private vehicle     No  Equipment Recommendations  Other (comment) (TBD post-acute; may benefit from roho cushion if he doesnt have one)    Recommendations for Other Services       Precautions / Restrictions Precautions Precautions: Fall Precaution Comments: poor trunk control, decreased BLE sensation Restrictions Weight Bearing Restrictions: No     Mobility  Bed Mobility Overal bed mobility: Needs Assistance Bed Mobility: Supine to Sit, Sit to Supine, Rolling Rolling: Min assist   Supine to sit: Supervision Sit to supine: Min assist   General bed mobility comments: assistance with BLEs to return to supine    Transfers Overall transfer level: Needs assistance   Transfers: Bed to chair/wheelchair/BSC         Anterior-Posterior transfers: Mod assist, +2 physical assistance   General transfer comment: minA for bed>wheelchair via posterior scoot with +1 assist, pt needing modA (+2 safety) for chair>bed via anterior scoot. Pt needs assist for line and BLE mgmt by second staff member with PTA assisting him to advance and lift hips with draw pad back up on to bed surface. Pt asking about using a slide board, however due to extensive wounds, PTA reviewed that this is not a good option for him at this time.    Ambulation/Gait                   Psychologist, counselling mobility: Yes Wheelchair propulsion: Both upper extremities Wheelchair parts: Needs assistance Distance: 150 (including rest breaks) Wheelchair Assistance Details (indicate cue type and reason): Pt needs Min A navigating obstacles in the room and  cues for environmental awareness/safety. PTA pushed pt back to room after he fatigued at ~150ft.  Geomat pressure relief cushion ordered and brought to room for wheelchair use at beginning of session as the  cushion in his room does not fit in the personal wheelchair brought by his family (it is too wide).    Balance Overall balance assessment: Needs assistance Sitting-balance support: Single extremity supported, Feet supported Sitting balance-Leahy Scale: Poor Sitting balance - Comments: multiple anterior LOB (pt hooking RUE behind his wheelchair back rest to prevent anterior LOB)                                    Cognition Arousal: Alert Behavior During Therapy: WFL for tasks assessed/performed Overall Cognitive Status: Within Functional Limits for tasks assessed                                 General Comments: Pt frequently c/o feeling cold and asking to go outside where it is warmer.        Exercises General Exercises - Upper Extremity Chair Push Up: AROM, Both, 5 reps, Seated    General Comments General comments (skin integrity, edema, etc.): Pt bed very saturated and RN notified he needs bed bath, per RN they plan wound dressing change after he returns to room; pt frequently c/o feeling cold during session.      Pertinent Vitals/Pain Pain Assessment Pain Assessment: Faces Faces Pain Scale: Hurts little more Pain Location: neck Pain Descriptors / Indicators: Grimacing, Discomfort Pain Intervention(s): Monitored during session, Repositioned     PT Goals (current goals can now be found in the care plan section) Acute Rehab PT Goals Patient Stated Goal: To go home to spouse PT Goal Formulation: With patient Time For Goal Achievement: 05/01/23 Progress towards PT goals: Progressing toward goals    Frequency    Min 1X/week      PT Plan         AM-PAC PT "6 Clicks" Mobility   Outcome Measure  Help needed turning from your back to your side while in a flat bed without using bedrails?: A Little Help needed moving from lying on your back to sitting on the side of a flat bed without using bedrails?: A Little Help needed moving to and  from a bed to a chair (including a wheelchair)?: A Lot Help needed standing up from a chair using your arms (e.g., wheelchair or bedside chair)?: Total Help needed to walk in hospital room?: Total Help needed climbing 3-5 steps with a railing? : Total 6 Click Score: 11    End of Session Equipment Utilized During Treatment: Other (comment) (draw pads) Activity Tolerance: Patient tolerated treatment well;Other (comment) (c/o feeling cold) Patient left: in bed;with call bell/phone within reach;with bed alarm set Nurse Communication: Mobility status;Other (comment) (pt c/o feeling cold, wants to go outside where it was warm, wants coffee (fluid restriction)) PT Visit Diagnosis: Other abnormalities of gait and mobility (R26.89)     Time: 1308-6578 PT Time Calculation (min) (ACUTE ONLY): 36 min  Charges:    $Therapeutic Activity: 8-22 mins $Wheel Chair Management: 8-22 mins PT General Charges $$ ACUTE PT VISIT: 1 Visit                      P., PTA Acute Rehabilitation Services Secure Chat Preferred 9a-5:30pm Office:  [phone]    Dorathy Kinsman  04/29/2023, 5:55 PM

## 2023-04-29 NOTE — Plan of Care (Signed)
  Problem: Education: Goal: Knowledge of General Education information will improve Description: Including pain rating scale, medication(s)/side effects and non-pharmacologic comfort measures Outcome: Progressing   Problem: Health Behavior/Discharge Planning: Goal: Ability to manage health-related needs will improve Outcome: Progressing   Problem: Clinical Measurements: Goal: Ability to maintain clinical measurements within normal limits will improve Outcome: Progressing Goal: Will remain free from infection Outcome: Progressing Goal: Diagnostic test results will improve Outcome: Progressing Goal: Respiratory complications will improve Outcome: Progressing Goal: Cardiovascular complication will be avoided Outcome: Progressing   Problem: Activity: Goal: Risk for activity intolerance will decrease Outcome: Progressing   Problem: Nutrition: Goal: Adequate nutrition will be maintained Outcome: Progressing   Problem: Coping: Goal: Level of anxiety will decrease Outcome: Progressing   Problem: Elimination: Goal: Will not experience complications related to bowel motility Outcome: Progressing Goal: Will not experience complications related to urinary retention Outcome: Progressing   Problem: Pain Managment: Goal: General experience of comfort will improve Outcome: Progressing   Problem: Safety: Goal: Ability to remain free from injury will improve Outcome: Progressing   Problem: Skin Integrity: Goal: Risk for impaired skin integrity will decrease Outcome: Progressing   Problem: Education: Goal: Knowledge of General Education information will improve Description: Including pain rating scale, medication(s)/side effects and non-pharmacologic comfort measures Outcome: Progressing   Problem: Health Behavior/Discharge Planning: Goal: Ability to manage health-related needs will improve Outcome: Progressing   Problem: Clinical Measurements: Goal: Ability to maintain  clinical measurements within normal limits will improve Outcome: Progressing   Problem: Clinical Measurements: Goal: Will remain free from infection Outcome: Progressing   Problem: Clinical Measurements: Goal: Diagnostic test results will improve Outcome: Progressing   Problem: Clinical Measurements: Goal: Respiratory complications will improve Outcome: Progressing   Problem: Clinical Measurements: Goal: Cardiovascular complication will be avoided Outcome: Progressing   Problem: Activity: Goal: Risk for activity intolerance will decrease Outcome: Progressing   Problem: Nutrition: Goal: Adequate nutrition will be maintained Outcome: Progressing   Problem: Coping: Goal: Level of anxiety will decrease Outcome: Progressing   Problem: Elimination: Goal: Will not experience complications related to bowel motility Outcome: Progressing   Problem: Elimination: Goal: Will not experience complications related to urinary retention Outcome: Progressing   Problem: Pain Managment: Goal: General experience of comfort will improve Outcome: Progressing   Problem: Safety: Goal: Ability to remain free from injury will improve Outcome: Progressing   Problem: Skin Integrity: Goal: Risk for impaired skin integrity will decrease Outcome: Progressing

## 2023-04-29 NOTE — Progress Notes (Signed)
Bedbath/Wound care performed per order at this time. All mepilex changed. Full linen change, Patient turned to right side.

## 2023-04-29 NOTE — Progress Notes (Signed)
[facility]  Date of Admission:  04/08/2023      Total days of antibiotics 20             ASSESSMENT: Steve [name] is a 32 y.o. male admitted with acute on chronic hip infections with chronic osteomyelitis.   A/C Osteomyelitis and Septic Arthritis B/L Hips -  S/P Debridement of Abscesses [place]) 04/24/2023 WCx growing Proteus Mirabilis, Acinetobacter Baummii (sensitive), Enterococcus Avium, Bacteroides, Prevotela, Providencia Stuartii, VRE, Enterococcus Gallinarum  -Will consolidate antibiotics to IV Daptomycin, Unasyn and PO Bactrim  -Suspect 4-6 weeks from surgery  H/O Bactrim Reaction -  -Not specified and he does not recall.  -D/W his nurse today and we will try him on the bactrim to cover the providencia species    Disposition -  -SNF in the works  Lethargic today -  -?over medication. It has never taken this much effort to get him awake on previous assessments. Appears doses have been decreasing per MAR -D/W his nurse today    PLAN: Daptomycin + Unasyn + PO Bactrim  Follow for bactrim tolerance  SNF ? For discharge   Principal Problem:   Septic shock (HCC) Active Problems:   Amphetamine abuse (HCC)   MRSA bacteremia   Septic arthritis (HCC)   Pressure ulcer   Wound infection    ARIPiprazole  15 mg Oral Daily   ascorbic acid  500 mg Oral Daily   Chlorhexidine Gluconate Cloth  6 each Topical Daily   cholecalciferol  1,000 Units Oral Daily   cyanocobalamin  1,000 mcg Subcutaneous Q30 days   docusate sodium  100 mg Oral BID   enoxaparin (LOVENOX) injection  40 mg Subcutaneous Q24H   feeding supplement  237 mL Oral TID BM   leptospermum manuka honey  1 Application Topical Daily   lidocaine (PF)  10 mL Infiltration Once   metroNIDAZOLE  500 mg Oral Q12H   midodrine  10 mg Oral Q8H   multivitamin with minerals  1 tablet Oral Daily   nutrition supplement (JUVEN)  1 packet Oral BID BM   nystatin   Topical TID    pantoprazole  40 mg Oral Daily   sodium chloride  1 g Oral BID WC   Vashe Wound Irrigation Optime  34 oz Topical Once   vitamin E  400 Units Oral Daily    SUBJECTIVE: Difficult to wake up. Nods no when we ask if he has had any reactions to bactrim / sulfa in the past.    Review of Systems: Review of Systems  Constitutional:  Negative for chills and fever.  Gastrointestinal:  Negative for abdominal pain, nausea and vomiting.  Skin:  Negative for rash.    Allergies  Allergen Reactions   Caffeine Anaphylaxis   Chocolate Anaphylaxis   Bactrim [Sulfamethoxazole-Trimethoprim] Other (See Comments)    Reaction not listed.   Tuberculin, Ppd     Other reaction(s): Eruption of skin   Sulfa Antibiotics Rash    OBJECTIVE: Vitals:   04/29/23 0900 04/29/23 1000 04/29/23 1200 04/29/23 1300  BP: 114/70  104/65 110/66  Pulse: (!) 102 100 90 90  Resp:   (!) 31 (!) 0  Temp:   98.3 F (36.8 C)   TempSrc:   Oral   SpO2: 100% 99% 97% 100%  Weight:      Height:       Body mass index is 22.17 kg/m.  Physical Exam Vitals reviewed.  Constitutional:  Appearance: He is ill-appearing.     Comments: Lethargic   Skin:    General: Skin is warm and dry.     Lab Results Lab Results  Component Value Date   WBC 11.3 (H) 04/29/2023   HGB 7.6 (L) 04/29/2023   HCT 24.5 (L) 04/29/2023   MCV 92.5 04/29/2023   PLT 334 04/29/2023    Lab Results  Component Value Date   CREATININE 0.58 (L) 04/29/2023   BUN 15 04/29/2023   NA 129 (L) 04/29/2023   K 3.9 04/29/2023   CL 99 04/29/2023   CO2 22 04/29/2023    Lab Results  Component Value Date   ALT 14 04/25/2023   AST 13 (L) 04/25/2023   ALKPHOS 105 04/25/2023   BILITOT 0.1 (L) 04/25/2023     Microbiology: Recent Results (from the past 240 hour(s))  Surgical pcr screen     Status: None   Collection Time: 04/24/23  9:15 AM   Specimen: Nasal Mucosa; Nasal Swab  Result Value Ref Range Status   MRSA, PCR NEGATIVE NEGATIVE Final    Staphylococcus aureus NEGATIVE NEGATIVE Final    Comment: (NOTE) The Xpert SA Assay (FDA approved for NASAL specimens in patients 27 years of age and older), is one component of a comprehensive surveillance program. It is not intended to diagnose infection nor to guide or monitor treatment. Performed at [facility], [address], [place], [place] [address]   Aerobic/Anaerobic Culture w Gram Stain (surgical/deep wound)     Status: None   Collection Time: 04/24/23 11:16 AM   Specimen: Wound; Tissue  Result Value Ref Range Status   Specimen Description TISSUE LEFT HIP  Final   Special Requests PT ON VANC, ANCEF  Final   Gram Stain   Final    NO WBC SEEN FEW GRAM NEGATIVE RODS RARE GRAM POSITIVE COCCI    Culture   Final    MODERATE PROTEUS MIRABILIS MODERATE ESCHERICHIA COLI MODERATE PROVIDENCIA STUARTII FEW ENTEROCOCCUS GALLINARUM VANCOMYCIN RESISTANT ENTEROCOCCUS ABUNDANT BACTEROIDES VULGATUS ABUNDANT PREVOTELLA DENTICOLA BETA LACTAMASE POSITIVE Performed at [facility], [address] [place], [place] [address]    Report Status 04/27/2023 FINAL  Final   Organism ID, Bacteria PROTEUS MIRABILIS  Final   Organism ID, Bacteria ESCHERICHIA COLI  Final   Organism ID, Bacteria PROVIDENCIA STUARTII  Final   Organism ID, Bacteria ENTEROCOCCUS GALLINARUM  Final      Susceptibility   Escherichia coli - MIC*    AMPICILLIN >=32 RESISTANT Resistant     CEFEPIME <=0.12 SENSITIVE Sensitive     CEFTAZIDIME <=1 SENSITIVE Sensitive     CEFTRIAXONE <=0.25 SENSITIVE Sensitive     CIPROFLOXACIN >=4 RESISTANT Resistant     GENTAMICIN <=1 SENSITIVE Sensitive     IMIPENEM <=0.25 SENSITIVE Sensitive     TRIMETH/SULFA >=320 RESISTANT Resistant     AMPICILLIN/SULBACTAM 4 SENSITIVE Sensitive     PIP/TAZO <=4 SENSITIVE Sensitive     * MODERATE ESCHERICHIA COLI   Enterococcus gallinarum - MIC*    AMPICILLIN <=2 SENSITIVE Sensitive     VANCOMYCIN RESISTANT Resistant     GENTAMICIN  SYNERGY SENSITIVE Sensitive     * FEW ENTEROCOCCUS GALLINARUM   Proteus mirabilis - MIC*    AMPICILLIN <=2 SENSITIVE Sensitive     CEFEPIME <=0.12 SENSITIVE Sensitive     CEFTAZIDIME <=1 SENSITIVE Sensitive     CEFTRIAXONE <=0.25 SENSITIVE Sensitive     CIPROFLOXACIN <=0.25 SENSITIVE Sensitive     GENTAMICIN <=1  SENSITIVE Sensitive     IMIPENEM 2 SENSITIVE Sensitive     TRIMETH/SULFA <=20 SENSITIVE Sensitive     AMPICILLIN/SULBACTAM <=2 SENSITIVE Sensitive     PIP/TAZO <=4 SENSITIVE Sensitive     * MODERATE PROTEUS MIRABILIS   Providencia stuartii - MIC*    AMPICILLIN >=32 RESISTANT Resistant     CEFEPIME <=0.12 SENSITIVE Sensitive     CEFTAZIDIME <=1 SENSITIVE Sensitive     CEFTRIAXONE <=0.25 SENSITIVE Sensitive     CIPROFLOXACIN <=0.25 SENSITIVE Sensitive     GENTAMICIN RESISTANT Resistant     IMIPENEM 2 SENSITIVE Sensitive     TRIMETH/SULFA <=20 SENSITIVE Sensitive     AMPICILLIN/SULBACTAM >=32 RESISTANT Resistant     PIP/TAZO <=4 SENSITIVE Sensitive     * MODERATE PROVIDENCIA STUARTII  Aerobic/Anaerobic Culture w Gram Stain (surgical/deep wound)     Status: None   Collection Time: 04/24/23 11:18 AM   Specimen: Wound; Tissue  Result Value Ref Range Status   Specimen Description TISSUE RIGHT HIP  Final   Special Requests PT ON ANCEF,VANC  Final   Gram Stain   Final    ABUNDANT WBC PRESENT, PREDOMINANTLY PMN RARE GRAM POSITIVE COCCI ABUNDANT GRAM NEGATIVE RODS    Culture   Final    MODERATE PROTEUS MIRABILIS MODERATE ACINETOBACTER CALCOACETICUS/BAUMANNII COMPLEX FEW ENTEROCOCCUS AVIUM ABUNDANT BACTEROIDES SPECIES ABUNDANT PREVOTELLA DENTICOLA BETA LACTAMASE POSITIVE Performed at [facility], [address], [place], [place] [address]    Report Status 04/27/2023 FINAL  Final   Organism ID, Bacteria PROTEUS MIRABILIS  Final   Organism ID, Bacteria ACINETOBACTER CALCOACETICUS/BAUMANNII COMPLEX  Final   Organism ID, Bacteria ENTEROCOCCUS AVIUM  Final       Susceptibility   Acinetobacter calcoaceticus/baumannii complex - MIC*    CEFTAZIDIME 4 SENSITIVE Sensitive     CIPROFLOXACIN <=0.25 SENSITIVE Sensitive     GENTAMICIN <=1 SENSITIVE Sensitive     IMIPENEM <=0.25 SENSITIVE Sensitive     PIP/TAZO <=4 SENSITIVE Sensitive     TRIMETH/SULFA <=20 SENSITIVE Sensitive     AMPICILLIN/SULBACTAM <=2 SENSITIVE Sensitive     * MODERATE ACINETOBACTER CALCOACETICUS/BAUMANNII COMPLEX   Enterococcus avium - MIC*    AMPICILLIN <=2 SENSITIVE Sensitive     VANCOMYCIN <=0.5 SENSITIVE Sensitive     GENTAMICIN SYNERGY SENSITIVE Sensitive     * FEW ENTEROCOCCUS AVIUM   Proteus mirabilis - MIC*    AMPICILLIN <=2 SENSITIVE Sensitive     CEFEPIME <=0.12 SENSITIVE Sensitive     CEFTAZIDIME <=1 SENSITIVE Sensitive     CEFTRIAXONE <=0.25 SENSITIVE Sensitive     CIPROFLOXACIN <=0.25 SENSITIVE Sensitive     GENTAMICIN <=1 SENSITIVE Sensitive     IMIPENEM 2 SENSITIVE Sensitive     TRIMETH/SULFA <=20 SENSITIVE Sensitive     AMPICILLIN/SULBACTAM <=2 SENSITIVE Sensitive     PIP/TAZO <=4 SENSITIVE Sensitive     * MODERATE PROTEUS MIRABILIS     Steve Alberts, MSN, NP-C [facility]  [email or web address] Pager: [phone] Office: [phone] [facility] Main Line: [phone] *Secure Chat Communication Welcome   Total encounter time - 24 minutes

## 2023-04-29 NOTE — Plan of Care (Signed)

## 2023-04-29 NOTE — Progress Notes (Signed)
PROGRESS NOTE        PATIENT DETAILS Name: Steve [name] Age: 32 y.o. Sex: male Date of Birth: 1991/01/15 Admit Date: 04/08/2023 Admitting Physician Briant Sites, DO [id number], [facility]  Brief Summary: 32 year old male with with history of paraplegia-known sacral decubitus ulcers with chronic osteomyelitis, substance abuse, chronic hypotension on midodrine-presented with septic shock in the setting of polymicrobial bacteremia and acute on chronic osteomyelitis of sacral decubitus ulcers and left hip septic arthritis.  Initially admitted to the ICU-stabilized and subsequently transferred to [facility].  Evaluated by orthopedics-no surgical interventions recommended.  Evaluated by ID-with plans for 6 weeks of IV daptomycin/Rocephin.  Unfortunately-further hospital course complicated by intermittent fevers-upon further evaluation with MRI pelvis-he was found to have abscess.  See below for further details.     Social worker following for placement-as patient is not a candidate for outpatient IV antibiotics (history of drug use)Patient is a 32 y.o.  male    Significant studies: 7/22>> CT abdomen/pelvis: Septic arthritis left hip with osteomyelitis of left ischium/inferior pubic rami.  Posterior left hip dislocation. 7/23>> TTE: EF 60-65% 8/02>> CT pelvis: Unchanged left hip dislocation-chronic osteomyelitis left pelvis related to ischemic ulcer, possible ill-defined fluid collection adjacent to the ulcer. 8/02>> bilateral lower extremity Doppler: No DVT 8/03>> MRI pelvis: Rim enhancing collection posterior right proximal femur concerning for abscess, multiple small abscesses around proximal left femur  Significant microbiology data: 7/22>> blood culture: Staph aureus, group C streptococcus, Proteus 7/23>> blood culture: Proteus/E. Coli 7/24>> blood culture: No growth 8/01>> blood culture: Peptostreptococcus, Bacteroides 8/01>> blood culture: No  growth 8/07>> intraoperative tissue culture right hip: Proteus Mirabilis 8/07>> intraoperative tissue culture left hip: Proteus mirabilis  Procedures: 7/29>> PICC by IR 8/07>> bilateral hip excisional debridement  Consults: PCCM ID Ortho  Subjective: Patient in bed, appears comfortable, denies any headache, no fever, no chest pain or pressure, no shortness of breath , no abdominal pain. No focal weakness.  Objective: Vitals: Blood pressure 114/70, pulse 100, temperature 99.2 F (37.3 C), temperature source Oral, resp. rate 19, height 5\' 11"  (1.803 m), weight 72.1 kg, SpO2 99%.   Exam:  Awake Alert, diffuse weakness, functional quadriplegia, R. 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 S/p surgical debridement of bilateral hips on 8/7 (intraoperative cultures positive for Proteus).  TSH on 8/1 stable, a.m. cortisol in 8/2 stable as well.  If hypothermia persists-repeat a.m. cortisol tomorrow. ID following-remains on daptomycin/ceftriaxone/metronidazole, defer end dates and antibiotic regimen to ID.   Use warming blanket as needed for mild hypothermia, stays uncovered in the bed, counseled, keep room warm, TSH and random cortisol stable. Per ID-TEE not needed as patient will be on a prolonged course of antibiotics.  Note-not a candidate for home IV infusion therapies-Per social work-patient  has been rejected by the [place] system-and hence will remain inpatient here at  [facility] to complete course of IV antibiotics.    Acute on chronic normocytic anemia  Secondary to acute illness-superimposed on anemia of chronic disease Continue to follow CBC-last transfusion on 7/29.   Hypotension Am cortisol on 8/2 was > 20 but subsequently much lower, Cosyntropin cosyntropin stimulation test is stable but borderline normal with 60-minute cortisol level 18.5, will continue to monitor will have low threshold of therapeutic trial with steroids in the future. midodrine and albumin for now.  Further diuretics.   Hyponatremia Due to excessive fluid intake, noncompliant with fluid restriction staff and patient both updated, urine electrolytes point towards possible SIADH, counseled on fluid restriction, monitor, cannot tolerate diuretics due to hypotension.  If remains hyponatremic will try [place].   Normocytic anemia Due to acute illness/chronic inflammation from sacral osteomyelitis/ulcers Continue to Hb-required PRBC transfusion on 8/4.   GERD PPI   Vitamin D deficiency continue with supplements   Vitamin B12 deficiency Continue supplementation   Vitamin A, C, D deficiency, zinc deficiency Continue with supplements   Neurogenic bladder/UTI Paraplegia Continue Foley, urine culture growing Proteus, continue with Rocephin   Schizophrenia/PTSD Seems stable Continue Abilify   Substance abuse  Self reports use of methamphetamines    3 mm left solid pulmonary nodule Further workup deferred to outpatient setting-as this is an incidental finding  R Amrm PICC, Foley catheter   Nutrition Status: Nutrition Problem: Inadequate oral intake Etiology: decreased appetite Signs/Symptoms: per patient/family report Interventions: Ensure Enlive (each supplement provides 350kcal and 20 grams of protein), MVI, Juven, Snacks    Pressure Ulcer: Pressure Injury 05/30/21 Ankle Left;Lateral  Unstageable - Full thickness tissue loss in which the base of the injury is covered by slough (yellow, tan, gray, green or brown) and/or eschar (tan, brown or black) in the wound bed. (Active)  05/30/21 1938  Location: Ankle  Location Orientation: Left;Lateral  Staging: Unstageable - Full thickness tissue loss in which the base of the injury is covered by slough (yellow, tan, gray, green or brown) and/or eschar (tan, brown or black) in the wound bed.  Wound Description (Comments):   Present on Admission: Yes  Dressing Type Foam - Lift dressing to assess site every shift 04/29/23 0400     Pressure Injury 08/29/21 Ischial tuberosity Left Stage 4 - Full thickness tissue loss with exposed bone, tendon or muscle. 1O1W9 (Active)  08/29/21 0100  Location: Ischial tuberosity  Location Orientation: Left  Staging: Stage 4 - Full thickness tissue loss with exposed bone, tendon or muscle.  Wound Description (Comments): [id number]  Present on Admission: Yes  Dressing Type Gauze (Comment) 04/29/23 0400     Pressure Injury 04/09/23 Pretibial Right;Lateral Stage 2 -  Partial thickness loss of dermis presenting as a shallow open injury with a red, pink wound bed without slough. full thickness, NOT a pressure injury (Active)  04/09/23 0351  Location: Pretibial  Location Orientation: Right;Lateral  Staging: Stage 2 -  Partial thickness loss of dermis presenting as a shallow open injury with a red, pink wound bed without slough.  Wound Description (Comments): full thickness, NOT a pressure injury  Present on Admission: Yes  Dressing Type Foam - Lift dressing to assess site every shift 04/29/23 0400     Pressure Injury 04/09/23 Ischial tuberosity Right Stage 4 - Full thickness tissue loss with exposed bone, tendon or muscle. (Active)  04/09/23 0100  Location: Ischial tuberosity  Location Orientation: Right  Staging: Stage 4 - Full thickness tissue loss with exposed bone, tendon or muscle.  Wound Description  (Comments):   Present on Admission: Yes  Dressing Type Gauze (Comment) 04/29/23 0400     Pressure Injury 04/09/23 Elbow Left;Posterior Stage 2 -  Partial thickness loss of dermis presenting as a shallow open injury with a red, pink wound bed without slough. (Active)  04/09/23 0330  Location: Elbow  Location Orientation: Left;Posterior  Staging: Stage 2 -  Partial thickness loss of dermis presenting as a shallow open injury with a red, pink wound bed without slough.  Wound Description (Comments):   Present on Admission: Yes  Dressing Type Foam - Lift dressing to assess site every shift 04/29/23 0400    BMI : Estimated body mass index is 22.17 kg/m as calculated from the following:   Height as of this encounter: 5\' 11"  (1.803 m).   Weight as of this encounter: 72.1 kg.   Code status:   Code Status: Full Code   DVT Prophylaxis: SCDs Start: 04/24/23 1718 enoxaparin (LOVENOX) injection 40 mg Start: 04/16/23 2200 SCDs Start: 04/09/23 0140   Family Communication: None at bedside   Disposition Plan: Status is: Inpatient Remains inpatient appropriate because: Severity of illness   Planned Discharge Destination:Home if willing but will remain inpatient to complete approximately 6 weeks of IV antibiotics-as patient not a candidate for outpatient therapies.   Diet: Diet Order             Diet regular Room service appropriate? Yes; Fluid consistency: Thin  Diet effective now                   MEDICATIONS: Scheduled Meds:  ARIPiprazole  15 mg Oral Daily   ascorbic acid  500 mg Oral Daily   Chlorhexidine Gluconate Cloth  6 each Topical Daily   cholecalciferol  1,000 Units Oral Daily   cyanocobalamin  1,000 mcg Subcutaneous Q30 days   docusate sodium  100 mg Oral BID   enoxaparin (LOVENOX) injection  40 mg Subcutaneous Q24H   feeding supplement  237 mL Oral TID BM   leptospermum manuka honey  1 Application Topical Daily   lidocaine (PF)  10 mL Infiltration Once    metroNIDAZOLE  500 mg Oral Q12H   midodrine  10 mg Oral Q8H   multivitamin with minerals  1 tablet Oral Daily   nutrition supplement (JUVEN)  1 packet Oral BID BM   nystatin   Topical TID   pantoprazole  40 mg Oral Daily   sodium chloride  1 g Oral BID WC   Vashe Wound Irrigation Optime  34 oz Topical Once   vitamin E  400 Units Oral Daily   Continuous Infusions:  sodium chloride     ceFEPime (MAXIPIME) IV 2 g (04/29/23 0419)   DAPTOmycin (CUBICIN) 600 mg in sodium chloride 0.9 % IVPB Stopped (04/28/23 1537)   PRN Meds:.acetaminophen, alum & mag hydroxide-simeth, baclofen, bisacodyl, calcium carbonate, magnesium citrate, methocarbamol **OR** [DISCONTINUED] methocarbamol (ROBAXIN) IV, [DISCONTINUED] metoCLOPramide **OR** metoCLOPramide (REGLAN) injection, morphine injection, ondansetron **OR** ondansetron (ZOFRAN) IV, mouth rinse, oxyCODONE, polyethylene glycol   I have personally reviewed following labs and imaging studies  LABORATORY DATA:  Recent Labs  Lab 04/23/23 0408 04/25/23 0409 04/27/23 0430 04/29/23 0455  WBC 7.3 13.8* 7.7 11.3*  HGB 7.2* 9.0* 7.8* 7.6*  HCT 22.9* 28.9* 25.7* 24.5*  PLT 220 306 334 334  MCV 90.9 89.5 92.1 92.5  MCH 28.6 27.9 28.0 28.7  MCHC 31.4 31.1 30.4 31.0  RDW 15.9* 15.8* 16.0* 16.2*    Recent Labs  Lab 04/25/23 0409 04/26/23 0330 04/27/23 0430 04/28/23 0644 04/29/23 0455  NA 128* 130* 131* 128* 129*  K 4.3 4.3 4.5 4.1 3.9  CL 96* 94* 95* 98 99  CO2 23 23 25 24 22   ANIONGAP 9 13 11 6 8   GLUCOSE 141* 112* 96 97 133*  BUN 9 16 16 12 15   CREATININE 0.65 0.62 0.61 0.54* 0.58*  AST 13*  --   --   --   --   ALT 14  --   --   --   --   ALKPHOS 105  --   --   --   --   BILITOT 0.1*  --   --   --   --   ALBUMIN <1.5*  --   --   --   --   BNP  --   --  94.7 66.4 107.9*  MG 2.1  --  2.1 2.1 2.0  CALCIUM 7.6* 7.6* 7.6* 7.6* 7.5*    Recent Labs  Lab 04/25/23 0409 04/26/23 0330 04/27/23 0430 04/28/23 0644 04/29/23 0455  BNP  --    --  94.7 66.4 107.9*  MG 2.1  --  2.1 2.1 2.0  CALCIUM 7.6* 7.6* 7.6* 7.6* 7.5*     Sepsis Labs: Lactic Acid, Venous    Component Value Date/Time   LATICACIDVEN 2.1 (HH) 04/08/2023 2219    RADIOLOGY STUDIES/RESULTS: DG Chest Port 1 View  Result Date: 04/29/2023 CLINICAL DATA:  Shortness of breath EXAM: PORTABLE CHEST 1 VIEW COMPARISON:  06/23/2022 FINDINGS: Right arm PICC tip in the SVC just above the right atrium. Heart size is normal. Patient has taken a shallow inspiration. Allowing for this, the lungs are probably clear. No effusion. No acute bone finding. IMPRESSION: Shallow inspiration. No active disease suspected. Right arm PICC tip in the SVC just above the right atrium. Electronically Signed   By: Paulina Fusi M.D.   On: 04/29/2023 07:58     LOS: 20 days   Signature  -    Susa Raring M.D on 04/29/2023 at 11:45 AM   -  To page go to [email or web address]

## 2023-04-30 ENCOUNTER — Other Ambulatory Visit (HOSPITAL_COMMUNITY): Payer: Self-pay

## 2023-04-30 DIAGNOSIS — B9562 Methicillin resistant Staphylococcus aureus infection as the cause of diseases classified elsewhere: Secondary | ICD-10-CM | POA: Diagnosis not present

## 2023-04-30 DIAGNOSIS — M86651 Other chronic osteomyelitis, right thigh: Secondary | ICD-10-CM | POA: Diagnosis not present

## 2023-04-30 DIAGNOSIS — R7881 Bacteremia: Secondary | ICD-10-CM | POA: Diagnosis not present

## 2023-04-30 DIAGNOSIS — F1510 Other stimulant abuse, uncomplicated: Secondary | ICD-10-CM | POA: Diagnosis not present

## 2023-04-30 MED ORDER — AMOXICILLIN-POT CLAVULANATE 875-125 MG PO TABS
1.0000 | ORAL_TABLET | Freq: Two times a day (BID) | ORAL | 0 refills | Status: AC
Start: 2023-04-30 — End: 2023-05-28
  Filled 2023-04-30: qty 56, 28d supply, fill #0

## 2023-04-30 MED ORDER — LINEZOLID 600 MG PO TABS
600.0000 mg | ORAL_TABLET | Freq: Two times a day (BID) | ORAL | 0 refills | Status: AC
Start: 2023-04-30 — End: 2023-05-28
  Filled 2023-04-30: qty 56, 28d supply, fill #0

## 2023-04-30 MED ORDER — CIPROFLOXACIN HCL 750 MG PO TABS
750.0000 mg | ORAL_TABLET | Freq: Two times a day (BID) | ORAL | 0 refills | Status: AC
  Filled 2023-04-30: qty 56, 28d supply, fill #0

## 2023-04-30 MED ORDER — TOLVAPTAN 15 MG PO TABS
15.0000 mg | ORAL_TABLET | Freq: Once | ORAL | Status: AC
  Administered 2023-04-30: 15 mg via ORAL
  Filled 2023-04-30: qty 1

## 2023-04-30 NOTE — Progress Notes (Signed)
Occupational Therapy Treatment Patient Details Name: Steve [name] MRN: [id number] DOB: 1990-09-23 Today's Date: 04/30/2023   History of present illness 32 year old male presented with septic shock in the setting of polymicrobial bacteremia and acute on chronic osteomyelitis of sacral decubitus ulcers and left hip septic arthritis.  Initially admitted to the ICU-stabilized and subsequently transferred to [facility].  Evaluated by orthopedics-no surgical interventions recommended. Evaluated by ID-with plans for 6 weeks of IV daptomycin/Rocephin.  Unfortunately-further hospital course complicated by intermittent fevers-upon further evaluation with MRI pelvis-he was found to have abscess.PMH: paraplegia-known sacral decubitus ulcers with chronic osteomyelitis, substance abuse, chronic hypotension on midodrine.   OT comments  Pt needs encouragement that he can do more without phsical assist, once positioned he needs setup assist to wash face at sink, completes posterior transfer to w/c with CGA but needed cues to properly align body with w/c. Pt even completed dynamic reaching beyond BOS to promote trunk control despite him being concerned of falling out of w/c. At end of session OT provided assist to setup patient with air mattress bed for better pressure relief. OT to continue to progress pt as able and teach compensatory strategies prn to complete ADLs. DC plans remain appropriate for SNF.      If plan is discharge home, recommend the following:  Two people to help with walking and/or transfers;A lot of help with bathing/dressing/bathroom;Assistance with cooking/housework;Direct supervision/assist for medications management;Assist for transportation;Help with stairs or ramp for entrance   Equipment Recommendations  Other (comment) (defer to next level of care)    Recommendations for Other Services      Precautions / Restrictions Precautions Precautions: Fall Precaution Comments: poor trunk control,  decreased BLE sensation Restrictions Weight Bearing Restrictions: No       Mobility Bed Mobility Overal bed mobility: Needs Assistance       Supine to sit: Supervision Sit to supine: Min assist   General bed mobility comments: assistance with BLEs to return to supine, min A with use of draw pads for anterior/posterior scoot to get higher/lower in bed    Transfers Overall transfer level: Needs assistance   Transfers: Bed to chair/wheelchair/BSC         Anterior-Posterior transfers: Contact guard assist   General transfer comment: Pt needing cues to align body with w/c and assist to hold w/c in place to perform transfer, mod A to control BLEs back to ground safely once in w/c. Pt pivoted back to bed by OT with Mod A due to lack of space in room and L side arm rest of wheel chair being jammed and not coming off     Balance Overall balance assessment: Needs assistance Sitting-balance support: Single extremity supported, Feet supported Sitting balance-Leahy Scale: Poor                                     ADL either performed or assessed with clinical judgement   ADL Overall ADL's : Needs assistance/impaired     Grooming: Sitting;Set up;Wash/dry face Grooming Details (indicate cue type and reason): Pt needing setup assist while sitting in w/c at sink to wash face. He needed encouragement that he could do so without physical assist                             Functional mobility during ADLs: Wheelchair;Contact guard assist General ADL Comments: Performed dynamic reaching  on high counter to challenge patients balance, pt completed reaching with BUEs o progress with core strength and promote better balance control. Pt self propelled himself in hall using w/c    Extremity/Trunk Assessment              Vision       Perception     Praxis      Cognition Arousal: Alert Behavior During Therapy: WFL for tasks assessed/performed Overall  Cognitive Status: Within Functional Limits for tasks assessed                                          Exercises      Shoulder Instructions       General Comments Bp 100/61 sitting in w/c following mobility.    Pertinent Vitals/ Pain       Pain Assessment Pain Assessment: Faces Faces Pain Scale: Hurts little more Pain Location: wounds on feet Pain Descriptors / Indicators: Discomfort Pain Intervention(s): Monitored during session, Repositioned  Home Living                                          Prior Functioning/Environment              Frequency  Min 1X/week        Progress Toward Goals  OT Goals(current goals can now be found in the care plan section)  Progress towards OT goals: Progressing toward goals  Acute Rehab OT Goals Patient Stated Goal: go home OT Goal Formulation: With patient Time For Goal Achievement: 05/14/23 Potential to Achieve Goals: Good  Plan      Co-evaluation                 AM-PAC OT "6 Clicks" Daily Activity     Outcome Measure   Help from another person eating meals?: None Help from another person taking care of personal grooming?: A Little Help from another person toileting, which includes using toliet, bedpan, or urinal?: A Lot Help from another person bathing (including washing, rinsing, drying)?: A Lot Help from another person to put on and taking off regular upper body clothing?: A Little Help from another person to put on and taking off regular lower body clothing?: A Lot 6 Click Score: 16    End of Session    OT Visit Diagnosis: Other abnormalities of gait and mobility (R26.89);Pain;Other (comment)   Activity Tolerance Patient tolerated treatment well   Patient Left in bed;with call bell/phone within reach;with nursing/sitter in room   Nurse Communication Mobility status        Time: 1610-9604 OT Time Calculation (min): 47 min  Charges: OT General Charges $OT  Visit: 1 Visit OT Treatments $Self Care/Home Management : 8-22 mins $Therapeutic Activity: 23-37 mins  04/30/2023  AB, OTR/L  Acute Rehabilitation Services  Office: [phone]   Tristan Schroeder 04/30/2023, 6:26 PM

## 2023-04-30 NOTE — Progress Notes (Signed)
PROGRESS NOTE        PATIENT DETAILS Name: Steve [name] Age: 32 y.o. Sex: male Date of Birth: 06-21-1991 Admit Date: 04/08/2023 Admitting Physician Briant Sites, DO WUJ:[id number], [facility]  Brief Summary: 32 year old male with with history of paraplegia-known sacral decubitus ulcers with chronic osteomyelitis, substance abuse, chronic hypotension on midodrine-presented with septic shock in the setting of polymicrobial bacteremia and acute on chronic osteomyelitis of sacral decubitus ulcers and left hip septic arthritis.  Initially admitted to the ICU-stabilized and subsequently transferred to [facility].  Evaluated by orthopedics-no surgical interventions recommended.  Evaluated by ID-with plans for 6 weeks of IV daptomycin/Rocephin.  Unfortunately-further hospital course complicated by intermittent fevers-upon further evaluation with MRI pelvis-he was found to have abscess.  See below for further details.     Social worker following for placement-as patient is not a candidate for outpatient IV antibiotics (history of drug use)Patient is a 32 y.o.  male    Significant studies: 7/22>> CT abdomen/pelvis: Septic arthritis left hip with osteomyelitis of left ischium/inferior pubic rami.  Posterior left hip dislocation. 7/23>> TTE: EF 60-65% 8/02>> CT pelvis: Unchanged left hip dislocation-chronic osteomyelitis left pelvis related to ischemic ulcer, possible ill-defined fluid collection adjacent to the ulcer. 8/02>> bilateral lower extremity Doppler: No DVT 8/03>> MRI pelvis: Rim enhancing collection posterior right proximal femur concerning for abscess, multiple small abscesses around proximal left femur  Significant microbiology data: 7/22>> blood culture: Staph aureus, group C streptococcus, Proteus 7/23>> blood culture: Proteus/E. Coli 7/24>> blood culture: No growth 8/01>> blood culture: Peptostreptococcus, Bacteroides 8/01>> blood culture: No  growth 8/07>> intraoperative tissue culture right hip: Proteus Mirabilis 8/07>> intraoperative tissue culture left hip: Proteus mirabilis  Procedures: 7/29>> PICC by IR 8/07>> bilateral hip excisional debridement  Consults: PCCM ID Ortho  Subjective: Patient in bed, appears comfortable, denies any headache, no fever, no chest pain or pressure, no shortness of breath , no abdominal pain. No focal weakness.  Objective: Vitals: Blood pressure (!) 98/53, pulse 90, temperature 98.6 F (37 C), temperature source Oral, resp. rate (!) 23, height 5\' 11"  (1.803 m), weight 72.1 kg, SpO2 98%.   Exam:  Awake Alert, diffuse weakness, functional quadriplegia, R. 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 S/p surgical debridement of bilateral hips on 8/7 (intraoperative cultures positive for Proteus).  TSH on 8/1 stable, a.m. cortisol in 8/2 stable as well.  If hypothermia persists-repeat a.m. cortisol tomorrow. ID following-remains on daptomycin/ceftriaxone/metronidazole, defer end dates and antibiotic regimen to ID.   Use warming blanket as needed for mild hypothermia, stays uncovered in the bed, counseled, keep room warm, TSH and random cortisol stable. Per ID-TEE not needed as patient will be on a prolonged course of antibiotics.  Note-not a candidate for home IV infusion therapies-Per social  work-patient has been rejected by the [place] system-and hence will remain inpatient  here at [facility] health to complete course of IV antibiotics.   Note patient has informed the staff earlier this morning that he will be leaving AMA on coming Friday when he has a ride back home, he wanted to leave AMA today but did not have a ride, he has talked to his spouse about it as well.    Acute on chronic normocytic anemia  Secondary to acute illness-superimposed on anemia of chronic disease Continue to follow CBC-last transfusion on 7/29.   Hypotension Am cortisol on 8/2 was > 20 but subsequently much lower, Cosyntropin cosyntropin stimulation test is stable but borderline normal with 60-minute cortisol level 18.5, will continue to monitor will have low threshold of therapeutic trial with steroids in the future. midodrine and albumin for now.  Further diuretics.   Hyponatremia Due to excessive fluid intake, noncompliant with fluid restriction staff and patient both updated, urine electrolytes point towards possible SIADH, counseled on fluid restriction, monitor, cannot tolerate diuretics due to hypotension.  If remains hyponatremic will try Samsca.   Normocytic anemia Due to acute illness/chronic inflammation from sacral osteomyelitis/ulcers Continue to Hb-required PRBC transfusion on 8/4.   GERD PPI   Vitamin D deficiency continue with supplements   Vitamin B12 deficiency Continue supplementation   Vitamin A, C, D deficiency, zinc deficiency Continue with supplements   Neurogenic bladder/UTI Paraplegia Continue Foley, urine culture growing Proteus, continue with Rocephin   Schizophrenia/PTSD Seems stable Continue Abilify   Substance abuse  Self reports use of methamphetamines    3 mm left solid pulmonary nodule Further workup deferred to outpatient setting-as this is an incidental finding  R Amrm PICC, Foley catheter   Nutrition Status: Nutrition Problem: Inadequate oral  intake Etiology: decreased appetite Signs/Symptoms: per patient/family report Interventions: Ensure Enlive (each supplement provides 350kcal and 20 grams of protein), MVI, Juven, Snacks    Pressure Ulcer: Pressure Injury 05/30/21 Ankle Left;Lateral Unstageable - Full thickness tissue loss in which the base of the injury is covered by slough (yellow, tan, gray, green or brown) and/or eschar (tan, brown or black) in the wound bed. (Active)  05/30/21 1938  Location: Ankle  Location Orientation: Left;Lateral  Staging: Unstageable - Full thickness tissue loss in which the base of the injury is covered by slough (yellow, tan, gray, green or brown) and/or eschar (tan, brown or black) in the wound bed.  Wound Description (Comments):   Present on Admission: Yes  Dressing Type Foam - Lift dressing to assess site every shift 04/29/23 0400     Pressure Injury 08/29/21 Ischial tuberosity Left Stage 4 - Full thickness tissue loss with exposed bone, tendon or muscle. 6O1H0 (Active)  08/29/21 0100  Location: Ischial tuberosity  Location Orientation: Left  Staging: Stage 4 - Full thickness tissue loss with exposed bone, tendon or muscle.  Wound Description (Comments): [id number]  Present on Admission: Yes  Dressing Type Gauze (Comment) 04/29/23 0400     Pressure Injury 04/09/23 Pretibial Right;Lateral Stage 2 -  Partial thickness loss of dermis presenting as a shallow open injury with a red, pink wound bed without slough. full thickness, NOT a pressure injury (Active)  04/09/23 0351  Location: Pretibial  Location Orientation: Right;Lateral  Staging: Stage 2 -  Partial thickness loss of dermis presenting as a shallow open injury with a red, pink wound bed without slough.  Wound Description (Comments): full thickness, NOT a pressure injury  Present on Admission: Yes  Dressing Type Foam - Lift dressing to assess site every shift 04/29/23 0400  Pressure Injury 04/09/23 Ischial tuberosity Right Stage 4 -  Full thickness tissue loss with exposed bone, tendon or muscle. (Active)  04/09/23 0100  Location: Ischial tuberosity  Location Orientation: Right  Staging: Stage 4 - Full thickness tissue loss with exposed bone, tendon or muscle.  Wound Description (Comments):   Present on Admission: Yes  Dressing Type Gauze (Comment) 04/29/23 0400     Pressure Injury 04/09/23 Elbow Left;Posterior Stage 2 -  Partial thickness loss of dermis presenting as a shallow open injury with a red, pink wound bed without slough. (Active)  04/09/23 0330  Location: Elbow  Location Orientation: Left;Posterior  Staging: Stage 2 -  Partial thickness loss of dermis presenting as a shallow open injury with a red, pink wound bed without slough.  Wound Description (Comments):   Present on Admission: Yes  Dressing Type Foam - Lift dressing to assess site every shift 04/29/23 0400    BMI : Estimated body mass index is 22.17 kg/m as calculated from the following:   Height as of this encounter: 5\' 11"  (1.803 m).   Weight as of this encounter: 72.1 kg.   Code status:   Code Status: Full Code   DVT Prophylaxis: SCDs Start: 04/24/23 1718 enoxaparin (LOVENOX) injection 40 mg Start: 04/16/23 2200 SCDs Start: 04/09/23 0140   Family Communication: None at bedside   Disposition Plan: Status is: Inpatient Remains inpatient appropriate because: Severity of illness   Planned Discharge Destination:Home if willing but will remain inpatient to complete approximately 6 weeks of IV antibiotics-as patient not a candidate for outpatient therapies.   Diet: Diet Order             Diet regular Room service appropriate? Yes; Fluid consistency: Thin  Diet effective now                   MEDICATIONS: Scheduled Meds:  ARIPiprazole  15 mg Oral Daily   ascorbic acid  500 mg Oral Daily   Chlorhexidine Gluconate Cloth  6 each Topical Daily   cholecalciferol  1,000 Units Oral Daily   cyanocobalamin  1,000 mcg Subcutaneous  Q30 days   docusate sodium  100 mg Oral BID   enoxaparin (LOVENOX) injection  40 mg Subcutaneous Q24H   feeding supplement  237 mL Oral TID BM   leptospermum manuka honey  1 Application Topical Daily   lidocaine (PF)  10 mL Infiltration Once   midodrine  10 mg Oral Q8H   multivitamin with minerals  1 tablet Oral Daily   nutrition supplement (JUVEN)  1 packet Oral BID BM   nystatin   Topical TID   pantoprazole  40 mg Oral Daily   sodium chloride  1 g Oral BID WC   sulfamethoxazole-trimethoprim  2 tablet Oral Q12H   Vashe Wound Irrigation Optime  34 oz Topical Once   vitamin E  400 Units Oral Daily   Continuous Infusions:  sodium chloride     ampicillin-sulbactam (UNASYN) IV 3 g (04/30/23 0518)   DAPTOmycin (CUBICIN) 600 mg in sodium chloride 0.9 % IVPB 600 mg (04/29/23 1328)   PRN Meds:.acetaminophen, alum & mag hydroxide-simeth, baclofen, bisacodyl, calcium carbonate, magnesium citrate, methocarbamol **OR** [DISCONTINUED] methocarbamol (ROBAXIN) IV, [DISCONTINUED] metoCLOPramide **OR** metoCLOPramide (REGLAN) injection, morphine injection, ondansetron **OR** ondansetron (ZOFRAN) IV, mouth rinse, oxyCODONE, polyethylene glycol   I have personally reviewed following labs and imaging studies  LABORATORY DATA:  Recent Labs  Lab 04/25/23 0409 04/27/23 0430 04/29/23 0455  WBC 13.8* 7.7 11.3*  HGB 9.0*  7.8* 7.6*  HCT 28.9* 25.7* 24.5*  PLT 306 334 334  MCV 89.5 92.1 92.5  MCH 27.9 28.0 28.7  MCHC 31.1 30.4 31.0  RDW 15.8* 16.0* 16.2*    Recent Labs  Lab 04/25/23 0409 04/26/23 0330 04/27/23 0430 04/28/23 0644 04/29/23 0455 04/30/23 0428  NA 128* 130* 131* 128* 129* 128*  K 4.3 4.3 4.5 4.1 3.9 4.2  CL 96* 94* 95* 98 99 98  CO2 23 23 25 24 22 22   ANIONGAP 9 13 11 6 8 8   GLUCOSE 141* 112* 96 97 133* 87  BUN 9 16 16 12 15 12   CREATININE 0.65 0.62 0.61 0.54* 0.58* 0.56*  AST 13*  --   --   --   --   --   ALT 14  --   --   --   --   --   ALKPHOS 105  --   --   --   --    --   BILITOT 0.1*  --   --   --   --   --   ALBUMIN <1.5*  --   --   --   --   --   BNP  --   --  94.7 66.4 107.9* 31.5  MG 2.1  --  2.1 2.1 2.0 2.1  CALCIUM 7.6* 7.6* 7.6* 7.6* 7.5* 7.4*    Recent Labs  Lab 04/25/23 0409 04/26/23 0330 04/27/23 0430 04/28/23 0644 04/29/23 0455 04/30/23 0428  BNP  --   --  94.7 66.4 107.9* 31.5  MG 2.1  --  2.1 2.1 2.0 2.1  CALCIUM 7.6* 7.6* 7.6* 7.6* 7.5* 7.4*     Sepsis Labs: Lactic Acid, Venous    Component Value Date/Time   LATICACIDVEN 2.1 (HH) 04/08/2023 2219    RADIOLOGY STUDIES/RESULTS: DG Chest Port 1 View  Result Date: 04/29/2023 CLINICAL DATA:  Shortness of breath EXAM: PORTABLE CHEST 1 VIEW COMPARISON:  06/23/2022 FINDINGS: Right arm PICC tip in the SVC just above the right atrium. Heart size is normal. Patient has taken a shallow inspiration. Allowing for this, the lungs are probably clear. No effusion. No acute bone finding. IMPRESSION: Shallow inspiration. No active disease suspected. Right arm PICC tip in the SVC just above the right atrium. Electronically Signed   By: Paulina Fusi M.D.   On: 04/29/2023 07:58     LOS: 21 days   Signature  -    Susa Raring M.D on 04/30/2023 at 11:12 AM   -  To page go to [email or web address]

## 2023-04-30 NOTE — Progress Notes (Signed)
Subjective:  Patient had signed documents to leave AGAINST MEDICAL ADVICE this morning but later changed his mind when his girlfriend was unwilling to pick him up   Antibiotics:  Anti-infectives (From admission, onward)    Start     Dose/Rate Route Frequency Ordered Stop   04/29/23 2200  sulfamethoxazole-trimethoprim (BACTRIM DS) 800-160 MG per tablet 2 tablet        2 tablet Oral Every 12 hours 04/29/23 1528     04/29/23 1615  Ampicillin-Sulbactam (UNASYN) 3 g in sodium chloride 0.9 % 100 mL IVPB        3 g 200 mL/hr over 30 Minutes Intravenous Every 6 hours 04/29/23 1528     04/29/23 1415  sulfamethoxazole-trimethoprim (BACTRIM DS) 800-160 MG per tablet 1 tablet        1 tablet Oral  Once 04/29/23 1322 04/29/23 1432   04/27/23 2000  ceFEPIme (MAXIPIME) 2 g in sodium chloride 0.9 % 100 mL IVPB  Status:  Discontinued        2 g 200 mL/hr over 30 Minutes Intravenous Every 8 hours 04/27/23 1803 04/29/23 1528   04/24/23 1115  ceFAZolin (ANCEF) IVPB 2g/100 mL premix  Status:  Discontinued        2 g 200 mL/hr over 30 Minutes Intravenous On call to O.R. 04/24/23 1105 04/24/23 1343   04/24/23 0915  ceFAZolin (ANCEF) IVPB 2g/100 mL premix        2 g 200 mL/hr over 30 Minutes Intravenous On call to O.R. 04/24/23 0914 04/24/23 1203   04/24/23 0915  vancomycin (VANCOCIN) IVPB 1000 mg/200 mL premix        1,000 mg 200 mL/hr over 60 Minutes Intravenous On call to O.R. 04/24/23 0914 04/24/23 1356   04/24/23 0858  ceFAZolin (ANCEF) 2-4 GM/100ML-% IVPB       Note to Pharmacy: [facility], [place]: cabinet override      04/24/23 0858 04/24/23 1140   04/22/23 1415  metroNIDAZOLE (FLAGYL) tablet 500 mg  Status:  Discontinued        500 mg Oral Every 12 hours 04/22/23 1326 04/29/23 1528   04/11/23 1400  vancomycin (VANCOCIN) IVPB 1000 mg/200 mL premix  Status:  Discontinued        1,000 mg 200 mL/hr over 60 Minutes Intravenous Every 8 hours 04/11/23 0828 04/11/23 0942   04/11/23 1100   cefTRIAXone (ROCEPHIN) 2 g in sodium chloride 0.9 % 100 mL IVPB  Status:  Discontinued        2 g 200 mL/hr over 30 Minutes Intravenous Every 24 hours 04/11/23 0942 04/27/23 1803   04/11/23 1030  DAPTOmycin (CUBICIN) 600 mg in sodium chloride 0.9 % IVPB        8 mg/kg  76 kg 124 mL/hr over 30 Minutes Intravenous Daily 04/11/23 0942     04/10/23 1200  cefTRIAXone (ROCEPHIN) 2 g in sodium chloride 0.9 % 100 mL IVPB  Status:  Discontinued        2 g 200 mL/hr over 30 Minutes Intravenous Every 24 hours 04/10/23 1101 04/10/23 1728   04/09/23 1800  vancomycin (VANCOCIN) IVPB 1000 mg/200 mL premix  Status:  Discontinued        1,000 mg 200 mL/hr over 60 Minutes Intravenous Every 12 hours 04/09/23 1130 04/11/23 0828   04/09/23 0500  vancomycin (VANCOCIN) IVPB 1000 mg/200 mL premix  Status:  Discontinued        1,000 mg 200 mL/hr over 60 Minutes Intravenous Every  8 hours 04/09/23 0211 04/09/23 1130   04/09/23 0500  piperacillin-tazobactam (ZOSYN) IVPB 3.375 g  Status:  Discontinued        3.375 g 12.5 mL/hr over 240 Minutes Intravenous Every 8 hours 04/09/23 0211 04/10/23 0825   04/08/23 2015  vancomycin (VANCOREADY) IVPB 1500 mg/300 mL        1,500 mg 150 mL/hr over 120 Minutes Intravenous  Once 04/08/23 2001 04/08/23 2323   04/08/23 2000  piperacillin-tazobactam (ZOSYN) IVPB 3.375 g        3.375 g 100 mL/hr over 30 Minutes Intravenous  Once 04/08/23 1949 04/08/23 2044       Medications: Scheduled Meds:  ARIPiprazole  15 mg Oral Daily   ascorbic acid  500 mg Oral Daily   Chlorhexidine Gluconate Cloth  6 each Topical Daily   cholecalciferol  1,000 Units Oral Daily   cyanocobalamin  1,000 mcg Subcutaneous Q30 days   docusate sodium  100 mg Oral BID   enoxaparin (LOVENOX) injection  40 mg Subcutaneous Q24H   feeding supplement  237 mL Oral TID BM   leptospermum manuka honey  1 Application Topical Daily   lidocaine (PF)  10 mL Infiltration Once   midodrine  10 mg Oral Q8H   multivitamin  with minerals  1 tablet Oral Daily   nutrition supplement (JUVEN)  1 packet Oral BID BM   nystatin   Topical TID   pantoprazole  40 mg Oral Daily   sodium chloride  1 g Oral BID WC   sulfamethoxazole-trimethoprim  2 tablet Oral Q12H   tolvaptan  15 mg Oral Once   Vashe Wound Irrigation Optime  34 oz Topical Once   vitamin E  400 Units Oral Daily   Continuous Infusions:  sodium chloride     ampicillin-sulbactam (UNASYN) IV 3 g (04/30/23 0518)   DAPTOmycin (CUBICIN) 600 mg in sodium chloride 0.9 % IVPB 600 mg (04/29/23 1328)   PRN Meds:.acetaminophen, alum & mag hydroxide-simeth, baclofen, bisacodyl, calcium carbonate, magnesium citrate, methocarbamol **OR** [DISCONTINUED] methocarbamol (ROBAXIN) IV, [DISCONTINUED] metoCLOPramide **OR** metoCLOPramide (REGLAN) injection, morphine injection, ondansetron **OR** ondansetron (ZOFRAN) IV, mouth rinse, oxyCODONE, polyethylene glycol    Objective: Weight change:   Intake/Output Summary (Last 24 hours) at 04/30/2023 0911 Last data filed at 04/30/2023 0546 Gross per 24 hour  Intake 1200 ml  Output 3300 ml  Net -2100 ml   Blood pressure (!) 98/53, pulse 90, temperature 98.6 F (37 C), temperature source Oral, resp. rate (!) 23, height 5\' 11"  (1.803 m), weight 72.1 kg, SpO2 98%. Temp:  [98.3 F (36.8 C)-98.9 F (37.2 C)] 98.6 F (37 C) (08/13 0828) Pulse Rate:  [71-111] 90 (08/13 0400) Resp:  [0-31] 23 (08/13 0400) BP: (94-126)/(49-98) 98/53 (08/13 0828) SpO2:  [95 %-100 %] 98 % (08/13 0400)  Physical Exam: Physical Exam Vitals reviewed.  Neurological:     Mental Status: He is alert.  Psychiatric:        Attention and Perception: Attention normal.        Mood and Affect: Mood is depressed. Affect is flat.        Speech: Speech normal.        Behavior: Behavior normal.        Thought Content: Thought content normal.        Cognition and Memory: Cognition and memory normal.        Judgment: Judgment is impulsive.       CBC:    BMET Recent Labs    04/29/23  0455 04/30/23 0428  NA 129* 128*  K 3.9 4.2  CL 99 98  CO2 22 22  GLUCOSE 133* 87  BUN 15 12  CREATININE 0.58* 0.56*  CALCIUM 7.5* 7.4*     Liver Panel  No results for input(s): "PROT", "ALBUMIN", "AST", "ALT", "ALKPHOS", "BILITOT", "BILIDIR", "IBILI" in the last 72 hours.     Sedimentation Rate No results for input(s): "ESRSEDRATE" in the last 72 hours. C-Reactive Protein No results for input(s): "CRP" in the last 72 hours.  Micro Results: Recent Results (from the past 720 hour(s))  Culture, blood (Routine x 2)     Status: None   Collection Time: 04/08/23  6:22 PM   Specimen: BLOOD  Result Value Ref Range Status   Specimen Description BLOOD SITE NOT SPECIFIED  Final   Special Requests   Final    BOTTLES DRAWN AEROBIC ONLY Blood Culture results may not be optimal due to an inadequate volume of blood received in culture bottles   Culture   Final    NO GROWTH 5 DAYS Performed at [facility], [address], [place], [place] [address]    Report Status 04/13/2023 FINAL  Final  Culture, blood (Routine x 2)     Status: Abnormal   Collection Time: 04/08/23  8:08 PM   Specimen: BLOOD  Result Value Ref Range Status   Specimen Description BLOOD SITE NOT SPECIFIED  Final   Special Requests   Final    BOTTLES DRAWN AEROBIC AND ANAEROBIC Blood Culture adequate volume   Culture  Setup Time   Final    GRAM POSITIVE COCCI IN CHAINS IN BOTH AEROBIC AND ANAEROBIC BOTTLES CRITICAL RESULT CALLED TO, READ BACK BY AND VERIFIED WITH: E. SINCLAIR PHARMD, AT 0940 D. VANHOOK GRAM POSITIVE COCCI IN CLUSTERS AEROBIC BOTTLE ONLY CRITICAL RESULT CALLED TO, READ BACK BY AND VERIFIED WITH: PHARMD EMILY S. [phone] [id number] FCP    Culture (A)  Final    METHICILLIN RESISTANT STAPHYLOCOCCUS AUREUS STREPTOCOCCUS GROUP C PROTEUS MIRABILIS CRITICAL RESULT CALLED TO, READ BACK BY AND VERIFIED WITH: PHARMD E.SINCLAIR AT 0940 ON 04/11/2023 BY  T.SAAD. SEE SEPARATE REPORT FOR DAPTOMYCIN MIC Performed at [facility], [address] [place], [place] [address]    Report Status 04/22/2023 FINAL  Final   Organism ID, Bacteria METHICILLIN RESISTANT STAPHYLOCOCCUS AUREUS  Final   Organism ID, Bacteria STREPTOCOCCUS GROUP C  Final   Organism ID, Bacteria PROTEUS MIRABILIS  Final      Susceptibility   Methicillin resistant staphylococcus aureus - MIC*    CIPROFLOXACIN >=8 RESISTANT Resistant     ERYTHROMYCIN >=8 RESISTANT Resistant     GENTAMICIN <=0.5 SENSITIVE Sensitive     OXACILLIN >=4 RESISTANT Resistant     TETRACYCLINE >=16 RESISTANT Resistant     VANCOMYCIN <=0.5 SENSITIVE Sensitive     TRIMETH/SULFA 160 RESISTANT Resistant     CLINDAMYCIN RESISTANT Resistant     RIFAMPIN <=0.5 SENSITIVE Sensitive     Inducible Clindamycin POSITIVE Resistant     LINEZOLID 2 SENSITIVE Sensitive     * METHICILLIN RESISTANT STAPHYLOCOCCUS AUREUS   Proteus mirabilis - MIC*    AMPICILLIN <=2 SENSITIVE Sensitive     CEFEPIME <=0.12 SENSITIVE Sensitive     CEFTAZIDIME <=1 SENSITIVE Sensitive     CEFTRIAXONE <=0.25 SENSITIVE Sensitive     CIPROFLOXACIN <=0.25 SENSITIVE Sensitive     GENTAMICIN <=1 SENSITIVE Sensitive     IMIPENEM 2 SENSITIVE Sensitive     TRIMETH/SULFA <=20 SENSITIVE  Sensitive     AMPICILLIN/SULBACTAM <=2 SENSITIVE Sensitive     PIP/TAZO <=4 SENSITIVE Sensitive     * PROTEUS MIRABILIS   Streptococcus group c - MIC*    CLINDAMYCIN RESISTANT Resistant     AMPICILLIN <=0.25 SENSITIVE Sensitive     ERYTHROMYCIN 2 RESISTANT Resistant     VANCOMYCIN 0.25 SENSITIVE Sensitive     CEFTRIAXONE <=0.12 SENSITIVE Sensitive     LEVOFLOXACIN 0.5 SENSITIVE Sensitive     PENICILLIN <=0.06 SENSITIVE Sensitive     * STREPTOCOCCUS GROUP C  Blood Culture ID Panel (Reflexed)     Status: Abnormal   Collection Time: 04/08/23  8:08 PM  Result Value Ref Range Status   Enterococcus faecalis NOT DETECTED NOT DETECTED Final   Enterococcus  Faecium NOT DETECTED NOT DETECTED Final   Listeria monocytogenes NOT DETECTED NOT DETECTED Final   Staphylococcus species NOT DETECTED NOT DETECTED Final   Staphylococcus aureus (BCID) NOT DETECTED NOT DETECTED Final   Staphylococcus epidermidis NOT DETECTED NOT DETECTED Final   Staphylococcus lugdunensis NOT DETECTED NOT DETECTED Final   Streptococcus species DETECTED (A) NOT DETECTED Final    Comment: Not Enterococcus species, Streptococcus agalactiae, Streptococcus pyogenes, or Streptococcus pneumoniae. CRITICAL RESULT CALLED TO, READ BACK BY AND VERIFIED WITH: E. SINCLAIR PHARMD, AT 0940 D. VANHOOK    Streptococcus agalactiae NOT DETECTED NOT DETECTED Final   Streptococcus pneumoniae NOT DETECTED NOT DETECTED Final   Streptococcus pyogenes NOT DETECTED NOT DETECTED Final   A.calcoaceticus-baumannii NOT DETECTED NOT DETECTED Final   Bacteroides fragilis NOT DETECTED NOT DETECTED Final   Enterobacterales NOT DETECTED NOT DETECTED Final   Enterobacter cloacae complex NOT DETECTED NOT DETECTED Final   Escherichia coli NOT DETECTED NOT DETECTED Final   Klebsiella aerogenes NOT DETECTED NOT DETECTED Final   Klebsiella oxytoca NOT DETECTED NOT DETECTED Final   Klebsiella pneumoniae NOT DETECTED NOT DETECTED Final   Proteus species NOT DETECTED NOT DETECTED Final   Salmonella species NOT DETECTED NOT DETECTED Final   Serratia marcescens NOT DETECTED NOT DETECTED Final   Haemophilus influenzae NOT DETECTED NOT DETECTED Final   Neisseria meningitidis NOT DETECTED NOT DETECTED Final   Pseudomonas aeruginosa NOT DETECTED NOT DETECTED Final   Stenotrophomonas maltophilia NOT DETECTED NOT DETECTED Final   Candida albicans NOT DETECTED NOT DETECTED Final   Candida auris NOT DETECTED NOT DETECTED Final   Candida glabrata NOT DETECTED NOT DETECTED Final   Candida krusei NOT DETECTED NOT DETECTED Final   Candida parapsilosis NOT DETECTED NOT DETECTED Final   Candida tropicalis NOT DETECTED NOT  DETECTED Final   Cryptococcus neoformans/gattii NOT DETECTED NOT DETECTED Final    Comment: Performed at [facility], [address], [place], [place] [address]  Blood Culture ID Panel (Reflexed)     Status: Abnormal   Collection Time: 04/08/23  8:08 PM  Result Value Ref Range Status   Enterococcus faecalis NOT DETECTED NOT DETECTED Final   Enterococcus Faecium NOT DETECTED NOT DETECTED Final   Listeria monocytogenes NOT DETECTED NOT DETECTED Final   Staphylococcus species DETECTED (A) NOT DETECTED Final    Comment: CRITICAL RESULT CALLED TO, READ BACK BY AND VERIFIED WITH: PHARMD EMILY S. [phone] FCP    Staphylococcus aureus (BCID) DETECTED (A) NOT DETECTED Final    Comment: Methicillin (oxacillin)-resistant Staphylococcus aureus (MRSA). MRSA is predictably resistant to beta-lactam antibiotics (except ceftaroline). Preferred therapy is vancomycin unless clinically contraindicated. Patient requires contact precautions if  hospitalized. CRITICAL RESULT CALLED TO, READ BACK BY AND  VERIFIED WITH: PHARMD EMILY S. [phone] FCP    Staphylococcus epidermidis NOT DETECTED NOT DETECTED Final   Staphylococcus lugdunensis NOT DETECTED NOT DETECTED Final   Streptococcus species DETECTED (A) NOT DETECTED Final    Comment: Not Enterococcus species, Streptococcus agalactiae, Streptococcus pyogenes, or Streptococcus pneumoniae. CRITICAL RESULT CALLED TO, READ BACK BY AND VERIFIED WITH: PHARMD EMILY S. [phone] [id number] FCP    Streptococcus agalactiae NOT DETECTED NOT DETECTED Final   Streptococcus pneumoniae NOT DETECTED NOT DETECTED Final   Streptococcus pyogenes NOT DETECTED NOT DETECTED Final   A.calcoaceticus-baumannii NOT DETECTED NOT DETECTED Final   Bacteroides fragilis NOT DETECTED NOT DETECTED Final   Enterobacterales NOT DETECTED NOT DETECTED Final   Enterobacter cloacae complex NOT DETECTED NOT DETECTED Final   Escherichia coli NOT DETECTED NOT DETECTED Final   Klebsiella  aerogenes NOT DETECTED NOT DETECTED Final   Klebsiella oxytoca NOT DETECTED NOT DETECTED Final   Klebsiella pneumoniae NOT DETECTED NOT DETECTED Final   Proteus species NOT DETECTED NOT DETECTED Final   Salmonella species NOT DETECTED NOT DETECTED Final   Serratia marcescens NOT DETECTED NOT DETECTED Final   Haemophilus influenzae NOT DETECTED NOT DETECTED Final   Neisseria meningitidis NOT DETECTED NOT DETECTED Final   Pseudomonas aeruginosa NOT DETECTED NOT DETECTED Final   Stenotrophomonas maltophilia NOT DETECTED NOT DETECTED Final   Candida albicans NOT DETECTED NOT DETECTED Final   Candida auris NOT DETECTED NOT DETECTED Final   Candida glabrata NOT DETECTED NOT DETECTED Final   Candida krusei NOT DETECTED NOT DETECTED Final   Candida parapsilosis NOT DETECTED NOT DETECTED Final   Candida tropicalis NOT DETECTED NOT DETECTED Final   Cryptococcus neoformans/gattii NOT DETECTED NOT DETECTED Final   Meth resistant mecA/C and MREJ DETECTED (A) NOT DETECTED Final    Comment: CRITICAL RESULT CALLED TO, READ BACK BY AND VERIFIED WITH: PHARMD EMILY S. [phone] FCP Performed at [facility], [address] [place], [place] [address]   Urine Culture     Status: Abnormal   Collection Time: 04/09/23 12:16 AM   Specimen: Urine, Random  Result Value Ref Range Status   Specimen Description URINE, RANDOM  Final   Special Requests   Final    NONE Reflexed from [id number] Performed at [facility], [address] [place], [place] [address]    Culture (A)  Final    >=100,000 COLONIES/mL PROTEUS MIRABILIS 30,000 COLONIES/mL ESCHERICHIA COLI    Report Status 04/13/2023 FINAL  Final   Organism ID, Bacteria ESCHERICHIA COLI (A)  Final   Organism ID, Bacteria PROTEUS MIRABILIS (A)  Final      Susceptibility   Escherichia coli - MIC*    AMPICILLIN >=32 RESISTANT Resistant     CEFAZOLIN <=4 SENSITIVE Sensitive     CEFEPIME <=0.12 SENSITIVE Sensitive     CEFTRIAXONE <=0.25  SENSITIVE Sensitive     CIPROFLOXACIN >=4 RESISTANT Resistant     GENTAMICIN <=1 SENSITIVE Sensitive     IMIPENEM <=0.25 SENSITIVE Sensitive     NITROFURANTOIN <=16 SENSITIVE Sensitive     TRIMETH/SULFA >=320 RESISTANT Resistant     AMPICILLIN/SULBACTAM 4 SENSITIVE Sensitive     PIP/TAZO <=4 SENSITIVE Sensitive     * 30,000 COLONIES/mL ESCHERICHIA COLI   Proteus mirabilis - MIC*    AMPICILLIN 16 INTERMEDIATE Intermediate     CEFAZOLIN <=4 SENSITIVE Sensitive     CEFEPIME <=0.12 SENSITIVE Sensitive     CEFTRIAXONE <=0.25 SENSITIVE Sensitive     CIPROFLOXACIN <=0.25 SENSITIVE  Sensitive     GENTAMICIN <=1 SENSITIVE Sensitive     IMIPENEM 1 SENSITIVE Sensitive     NITROFURANTOIN 128 RESISTANT Resistant     TRIMETH/SULFA <=20 SENSITIVE Sensitive     AMPICILLIN/SULBACTAM 8 SENSITIVE Sensitive     * >=100,000 COLONIES/mL PROTEUS MIRABILIS  Culture, blood (Routine X 2) w Reflex to ID Panel     Status: None   Collection Time: 04/10/23  6:50 AM   Specimen: BLOOD LEFT HAND  Result Value Ref Range Status   Specimen Description BLOOD LEFT HAND  Final   Special Requests   Final    BOTTLES DRAWN AEROBIC AND ANAEROBIC Blood Culture adequate volume   Culture   Final    NO GROWTH 5 DAYS Performed at [facility], [name] [place], [place] [address]    Report Status 04/15/2023 FINAL  Final  Culture, blood (Routine X 2) w Reflex to ID Panel     Status: None   Collection Time: 04/10/23  6:50 AM   Specimen: BLOOD LEFT HAND  Result Value Ref Range Status   Specimen Description BLOOD LEFT HAND  Final   Special Requests   Final    BOTTLES DRAWN AEROBIC AND ANAEROBIC Blood Culture adequate volume   Culture   Final    NO GROWTH 5 DAYS Performed at [facility], [address] [place], [place] [address]    Report Status 04/15/2023 FINAL  Final  Culture, blood (Routine X 2) w Reflex to ID Panel     Status: Abnormal   Collection Time: 04/18/23  8:06 AM   Specimen: BLOOD RIGHT ARM   Result Value Ref Range Status   Specimen Description BLOOD RIGHT ARM  Final   Special Requests   Final    BOTTLES DRAWN AEROBIC AND ANAEROBIC Blood Culture adequate volume   Culture  Setup Time   Final    GRAM POSITIVE COCCI IN CHAINS ANAEROBIC BOTTLE ONLY CRITICAL VALUE NOTED.  VALUE IS CONSISTENT WITH PREVIOUSLY REPORTED AND CALLED VALUE.    Culture (A)  Final    PEPTOSTREPTOCOCCUS SPECIES BACTEROIDES STERCORIS BETA LACTAMASE POSITIVE CRITICAL RESULT CALLED TO, READ BACK BY AND VERIFIED WITH: Antoine Primas PHARMD, AT 1323 04/22/23 Renato Shin Performed at [facility], [address] [place], [place] [address]    Report Status 04/22/2023 FINAL  Final  Culture, blood (Routine X 2) w Reflex to ID Panel     Status: None   Collection Time: 04/18/23  8:10 AM   Specimen: BLOOD LEFT HAND  Result Value Ref Range Status   Specimen Description BLOOD LEFT HAND  Final   Special Requests   Final    BOTTLES DRAWN AEROBIC ONLY Blood Culture adequate volume   Culture   Final    NO GROWTH 5 DAYS Performed at [facility], [address], [place], [place] [address]    Report Status 04/23/2023 FINAL  Final  Surgical pcr screen     Status: None   Collection Time: 04/24/23  9:15 AM   Specimen: Nasal Mucosa; Nasal Swab  Result Value Ref Range Status   MRSA, PCR NEGATIVE NEGATIVE Final   Staphylococcus aureus NEGATIVE NEGATIVE Final    Comment: (NOTE) The Xpert SA Assay (FDA approved for NASAL specimens in patients 82 years of age and older), is one component of a comprehensive surveillance program. It is not intended to diagnose infection nor to guide or monitor treatment. Performed at [facility], [address] [place], [place] [address]   Aerobic/Anaerobic Culture w Gram Stain (surgical/deep wound)     Status: None   Collection Time: 04/24/23 11:16 AM   Specimen: Wound; Tissue  Result Value Ref Range Status   Specimen Description TISSUE LEFT HIP  Final   Special  Requests PT ON VANC, ANCEF  Final   Gram Stain   Final    NO WBC SEEN FEW GRAM NEGATIVE RODS RARE GRAM POSITIVE COCCI    Culture   Final    MODERATE PROTEUS MIRABILIS MODERATE ESCHERICHIA COLI MODERATE PROVIDENCIA STUARTII FEW ENTEROCOCCUS GALLINARUM VANCOMYCIN RESISTANT ENTEROCOCCUS ABUNDANT BACTEROIDES VULGATUS ABUNDANT PREVOTELLA DENTICOLA BETA LACTAMASE POSITIVE Performed at [facility], [address], [place], [place] [address]    Report Status 04/27/2023 FINAL  Final   Organism ID, Bacteria PROTEUS MIRABILIS  Final   Organism ID, Bacteria ESCHERICHIA COLI  Final   Organism ID, Bacteria PROVIDENCIA STUARTII  Final   Organism ID, Bacteria ENTEROCOCCUS GALLINARUM  Final      Susceptibility   Escherichia coli - MIC*    AMPICILLIN >=32 RESISTANT Resistant     CEFEPIME <=0.12 SENSITIVE Sensitive     CEFTAZIDIME <=1 SENSITIVE Sensitive     CEFTRIAXONE <=0.25 SENSITIVE Sensitive     CIPROFLOXACIN >=4 RESISTANT Resistant     GENTAMICIN <=1 SENSITIVE Sensitive     IMIPENEM <=0.25 SENSITIVE Sensitive     TRIMETH/SULFA >=320 RESISTANT Resistant     AMPICILLIN/SULBACTAM 4 SENSITIVE Sensitive     PIP/TAZO <=4 SENSITIVE Sensitive     * MODERATE ESCHERICHIA COLI   Enterococcus gallinarum - MIC*    AMPICILLIN <=2 SENSITIVE Sensitive     VANCOMYCIN RESISTANT Resistant     GENTAMICIN SYNERGY SENSITIVE Sensitive     * FEW ENTEROCOCCUS GALLINARUM   Proteus mirabilis - MIC*    AMPICILLIN <=2 SENSITIVE Sensitive     CEFEPIME <=0.12 SENSITIVE Sensitive     CEFTAZIDIME <=1 SENSITIVE Sensitive     CEFTRIAXONE <=0.25 SENSITIVE Sensitive     CIPROFLOXACIN <=0.25 SENSITIVE Sensitive     GENTAMICIN <=1 SENSITIVE Sensitive     IMIPENEM 2 SENSITIVE Sensitive     TRIMETH/SULFA <=20 SENSITIVE Sensitive     AMPICILLIN/SULBACTAM <=2 SENSITIVE Sensitive     PIP/TAZO <=4 SENSITIVE Sensitive     * MODERATE PROTEUS MIRABILIS   Providencia stuartii - MIC*    AMPICILLIN >=32 RESISTANT  Resistant     CEFEPIME <=0.12 SENSITIVE Sensitive     CEFTAZIDIME <=1 SENSITIVE Sensitive     CEFTRIAXONE <=0.25 SENSITIVE Sensitive     CIPROFLOXACIN <=0.25 SENSITIVE Sensitive     GENTAMICIN RESISTANT Resistant     IMIPENEM 2 SENSITIVE Sensitive     TRIMETH/SULFA <=20 SENSITIVE Sensitive     AMPICILLIN/SULBACTAM >=32 RESISTANT Resistant     PIP/TAZO <=4 SENSITIVE Sensitive     * MODERATE PROVIDENCIA STUARTII  Aerobic/Anaerobic Culture w Gram Stain (surgical/deep wound)     Status: None   Collection Time: 04/24/23 11:18 AM   Specimen: Wound; Tissue  Result Value Ref Range Status   Specimen Description TISSUE RIGHT HIP  Final   Special Requests PT ON ANCEF,VANC  Final   Gram Stain   Final    ABUNDANT WBC PRESENT, PREDOMINANTLY PMN RARE GRAM POSITIVE COCCI ABUNDANT GRAM NEGATIVE RODS    Culture   Final    MODERATE PROTEUS MIRABILIS MODERATE ACINETOBACTER CALCOACETICUS/BAUMANNII COMPLEX FEW ENTEROCOCCUS AVIUM ABUNDANT BACTEROIDES SPECIES ABUNDANT PREVOTELLA DENTICOLA BETA LACTAMASE POSITIVE Performed at [facility], [address], , [place] [address]    Report Status 04/27/2023 FINAL  Final   Organism ID, Bacteria PROTEUS MIRABILIS  Final   Organism ID, Bacteria ACINETOBACTER CALCOACETICUS/BAUMANNII COMPLEX  Final   Organism ID, Bacteria ENTEROCOCCUS AVIUM  Final      Susceptibility   Acinetobacter calcoaceticus/baumannii complex - MIC*    CEFTAZIDIME 4 SENSITIVE Sensitive     CIPROFLOXACIN <=0.25 SENSITIVE Sensitive     GENTAMICIN <=1 SENSITIVE Sensitive     IMIPENEM <=0.25 SENSITIVE Sensitive     PIP/TAZO <=4 SENSITIVE Sensitive     TRIMETH/SULFA <=20 SENSITIVE Sensitive     AMPICILLIN/SULBACTAM <=2 SENSITIVE Sensitive     * MODERATE ACINETOBACTER CALCOACETICUS/BAUMANNII COMPLEX   Enterococcus avium - MIC*    AMPICILLIN <=2 SENSITIVE Sensitive     VANCOMYCIN <=0.5 SENSITIVE Sensitive     GENTAMICIN SYNERGY SENSITIVE Sensitive     * FEW ENTEROCOCCUS  AVIUM   Proteus mirabilis - MIC*    AMPICILLIN <=2 SENSITIVE Sensitive     CEFEPIME <=0.12 SENSITIVE Sensitive     CEFTAZIDIME <=1 SENSITIVE Sensitive     CEFTRIAXONE <=0.25 SENSITIVE Sensitive     CIPROFLOXACIN <=0.25 SENSITIVE Sensitive     GENTAMICIN <=1 SENSITIVE Sensitive     IMIPENEM 2 SENSITIVE Sensitive     TRIMETH/SULFA <=20 SENSITIVE Sensitive     AMPICILLIN/SULBACTAM <=2 SENSITIVE Sensitive     PIP/TAZO <=4 SENSITIVE Sensitive     * MODERATE PROTEUS MIRABILIS    Studies/Results: DG Chest Port 1 View  Result Date: 04/29/2023 CLINICAL DATA:  Shortness of breath EXAM: PORTABLE CHEST 1 VIEW COMPARISON:  06/23/2022 FINDINGS: Right arm PICC tip in the SVC just above the right atrium. Heart size is normal. Patient has taken a shallow inspiration. Allowing for this, the lungs are probably clear. No effusion. No acute bone finding. IMPRESSION: Shallow inspiration. No active disease suspected. Right arm PICC tip in the SVC just above the right atrium. Electronically Signed   By: Paulina Fusi M.D.   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 male with history of polysubstance abuse paraplegia now admitted with acute on chronic osteomyelitis and septic arthritis of bilateral hips status post debridement of abscesses by Dr. Lajoyce Corners on April 24, 2023.  Proteus Mirabilis, Acinetobacter Baummii (sensitive), Enterococcus Avium, Bacteroides, Prevotela, Providencia Stuartii, VRE, Enterococcus Gallinarum  isolated  Currently we are using daptomycin, unasyn and oral bactrim to cover all of the  organisms  When he DC we will likely send him out with oral zyvox, ciprofloxacin and augmentin  I have personally spent 50 minutes involved in face-to-face and non-face-to-face activities for this patient on the day of the visit. Professional time spent includes the following activities: Preparing to see the patient (review of tests), Obtaining and/or reviewing separately obtained history (admission/discharge record), Performing a medically appropriate examination and/or evaluation , Ordering medications/tests/procedures, referring and communicating with other health care professionals, Documenting clinical information in the EMR, Independently interpreting results (not separately reported), Communicating results to the patient/family/caregiver, Counseling and educating the patient/family/caregiver and  Care coordination (not separately reported).     LOS: 21 days   Acey Lav 04/30/2023, 9:11 AM

## 2023-05-01 DIAGNOSIS — R7881 Bacteremia: Secondary | ICD-10-CM | POA: Diagnosis not present

## 2023-05-01 DIAGNOSIS — M86651 Other chronic osteomyelitis, right thigh: Secondary | ICD-10-CM | POA: Diagnosis not present

## 2023-05-01 DIAGNOSIS — B9562 Methicillin resistant Staphylococcus aureus infection as the cause of diseases classified elsewhere: Secondary | ICD-10-CM | POA: Diagnosis not present

## 2023-05-01 DIAGNOSIS — F1510 Other stimulant abuse, uncomplicated: Secondary | ICD-10-CM | POA: Diagnosis not present

## 2023-05-01 LAB — PREPARE RBC (CROSSMATCH)

## 2023-05-01 MED ORDER — SODIUM CHLORIDE 0.9% IV SOLUTION: Freq: Once | INTRAVENOUS | Status: AC

## 2023-05-01 NOTE — Progress Notes (Signed)
PROGRESS NOTE        PATIENT DETAILS Name: Steve [name] Age: 32 y.o. Sex: male Date of Birth: Apr 09, 1991 Admit Date: 04/08/2023 Admitting Physician Briant Sites, DO [id number], [facility]  Brief Summary: 32 year old male with with history of paraplegia-known sacral decubitus ulcers with chronic osteomyelitis, substance abuse, chronic hypotension on midodrine-presented with septic shock in the setting of polymicrobial bacteremia and acute on chronic osteomyelitis of sacral decubitus ulcers and left hip septic arthritis.  Initially admitted to the ICU-stabilized and subsequently transferred to [facility].  Evaluated by orthopedics-no surgical interventions recommended.  Evaluated by ID-with plans for 6 weeks of IV daptomycin/Rocephin.  Unfortunately-further hospital course complicated by intermittent fevers-upon further evaluation with MRI pelvis-he was found to have abscess.  See below for further details.     Social worker following for placement-as patient is not a candidate for outpatient IV antibiotics (history of drug use)Patient is a 32 y.o.  male    Significant studies: 7/22>> CT abdomen/pelvis: Septic arthritis left hip with osteomyelitis of left ischium/inferior pubic rami.  Posterior left hip dislocation. 7/23>> TTE: EF 60-65% 8/02>> CT pelvis: Unchanged left hip dislocation-chronic osteomyelitis left pelvis related to ischemic ulcer, possible ill-defined fluid collection adjacent to the ulcer. 8/02>> bilateral lower extremity Doppler: No DVT 8/03>> MRI pelvis: Rim enhancing collection posterior right proximal femur concerning for abscess, multiple small abscesses around proximal left femur  Significant microbiology data: 7/22>> blood culture: Staph aureus, group C streptococcus, Proteus 7/23>> blood culture: Proteus/E. Coli 7/24>> blood culture: No growth 8/01>> blood culture: Peptostreptococcus, Bacteroides 8/01>> blood culture: No  growth 8/07>> intraoperative tissue culture right hip: Proteus Mirabilis 8/07>> intraoperative tissue culture left hip: Proteus mirabilis  Procedures: 7/29>> PICC by IR 8/07>> bilateral hip excisional debridement  Consults: PCCM ID Ortho  Subjective: Patient in bed, appears comfortable, denies any headache, no fever, no chest pain or pressure, no shortness of breath , no abdominal pain. No focal weakness.  Patient has informed the staff that he will be leaving AMA on the coming Friday when he has a ride to go back home.  He does not want to stay in the hospital any longer, he has zooms all responsibility for any adverse outcome.  He was extensively counseled by me multiple times.   Objective: Vitals: Blood pressure 109/68, pulse (!) 205, temperature 98.1 F (36.7 C), temperature source Oral, resp. rate 20, height 5\' 11"  (1.803 m), weight 72.1 kg, SpO2 100%.   Exam:  Awake Alert, diffuse weakness, functional quadriplegia, R. 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 S/p surgical debridement of bilateral hips on 8/7 (intraoperative cultures positive for Proteus).  TSH on 8/1 stable, a.m. cortisol in 8/2 stable as well.  If hypothermia persists-repeat a.m. cortisol tomorrow. ID following-remains on daptomycin/ceftriaxone/metronidazole, defer end dates and antibiotic regimen to ID.   Use  warming blanket  as needed for mild hypothermia, stays uncovered in the bed, counseled, keep room warm, TSH and random cortisol stable. Per ID-TEE not needed as patient will be on a prolonged course of antibiotics.  Note-not a candidate for home IV infusion therapies-Per social work[facility]and hence will remain inpatient here at [facility] to complete course of IV antibiotics.   Note patient has informed the staff earlier this morning that he will be leaving AMA on coming Friday when he has a ride back home, he wanted to leave AMA today but did not have a ride, he has talked to his spouse about it as well.    Acute on chronic normocytic anemia  Secondary to acute illness-superimposed on anemia of chronic disease He is receiving 1 unit of packed RBC on 05/01/2023, no signs of bleeding, denies any blood in stool or melena, continue to monitor.  Likely will give him another unit on 05/02/2023   Hypotension Am cortisol on 8/2 was > 20 but subsequently much lower, Cosyntropin cosyntropin stimulation test is stable but borderline normal with 60-minute cortisol level 18.5, will continue to monitor will have low threshold of therapeutic trial with steroids in the future. midodrine and albumin for now.  Further diuretics.   Hyponatremia Likely SIADH even after fluid restriction and Lasix persistent hyponatremia much improved after Samsca on 04/30/2023.   Normocytic anemia Due to acute illness/chronic inflammation from sacral osteomyelitis/ulcers Continue to Hb-required PRBC transfusion on 8/4.   GERD PPI   Vitamin D deficiency continue with supplements   Vitamin B12 deficiency Continue supplementation   Vitamin A, C, D deficiency, zinc deficiency Continue with supplements   Neurogenic bladder/UTI Paraplegia Continue Foley, urine culture growing Proteus, continue with Rocephin   Schizophrenia/PTSD Seems stable Continue Abilify   Substance abuse   Self reports use of methamphetamines    3 mm left solid pulmonary nodule Further workup deferred to outpatient setting-as this is an incidental finding  R Amrm PICC, Foley catheter   Nutrition Status: Nutrition Problem: Inadequate oral intake Etiology: decreased appetite Signs/Symptoms: per patient/family report Interventions: Ensure Enlive (each supplement provides 350kcal and 20 grams of protein), MVI, Juven, Snacks    Pressure Ulcer: Pressure Injury 05/30/21 Ankle Left;Lateral Unstageable - Full thickness tissue loss in which the base of the injury is covered by slough (yellow, tan, gray, green or brown) and/or eschar (tan, brown or black) in the wound bed. (Active)  05/30/21 1938  Location: Ankle  Location Orientation: Left;Lateral  Staging: Unstageable - Full thickness tissue loss in which the base of the injury is covered by slough (yellow, tan, gray, green or brown) and/or eschar (tan, brown or black) in the wound bed.  Wound Description (Comments):   Present on Admission: Yes  Dressing Type Foam - Lift dressing to assess site every shift 04/30/23 0845     Pressure Injury 08/29/21 Ischial tuberosity Left Stage 4 - Full thickness tissue loss with exposed bone, tendon or muscle. 1O1W9 (Active)  08/29/21 0100  Location: Ischial tuberosity  Location Orientation: Left  Staging: Stage 4 - Full thickness tissue loss with exposed bone, tendon or muscle.  Wound Description (Comments): [id number]  Present on Admission: Yes  Dressing Type Foam - Lift dressing to assess site every shift 05/01/23 0700     Pressure Injury 04/09/23 Pretibial Right;Lateral Stage 2 -  Partial thickness loss of dermis presenting as a shallow open injury with a red, pink wound bed without slough. full thickness, NOT a pressure  injury (Active)  04/09/23 0351  Location: Pretibial  Location Orientation: Right;Lateral  Staging: Stage 2 -  Partial thickness loss of dermis presenting as a shallow open injury with a  red, pink wound bed without slough.  Wound Description (Comments): full thickness, NOT a pressure injury  Present on Admission: Yes  Dressing Type Foam - Lift dressing to assess site every shift 05/01/23 0700     Pressure Injury 04/09/23 Ischial tuberosity Right Stage 4 - Full thickness tissue loss with exposed bone, tendon or muscle. (Active)  04/09/23 0100  Location: Ischial tuberosity  Location Orientation: Right  Staging: Stage 4 - Full thickness tissue loss with exposed bone, tendon or muscle.  Wound Description (Comments):   Present on Admission: Yes  Dressing Type Foam - Lift dressing to assess site every shift 05/01/23 0700     Pressure Injury 04/09/23 Elbow Left;Posterior Stage 2 -  Partial thickness loss of dermis presenting as a shallow open injury with a red, pink wound bed without slough. (Active)  04/09/23 0330  Location: Elbow  Location Orientation: Left;Posterior  Staging: Stage 2 -  Partial thickness loss of dermis presenting as a shallow open injury with a red, pink wound bed without slough.  Wound Description (Comments):   Present on Admission: Yes  Dressing Type Foam - Lift dressing to assess site every shift 04/30/23 0845    BMI : Estimated body mass index is 22.17 kg/m as calculated from the following:   Height as of this encounter: 5\' 11"  (1.803 m).   Weight as of this encounter: 72.1 kg.   Code status:   Code Status: Full Code   DVT Prophylaxis: SCDs Start: 04/24/23 1718 enoxaparin (LOVENOX) injection 40 mg Start: 04/16/23 2200 SCDs Start: 04/09/23 0140   Family Communication: None at bedside   Disposition Plan: Status is: Inpatient Remains inpatient appropriate because: Severity of illness   Planned Discharge Destination:Home if willing but will remain inpatient to complete approximately 6 weeks of IV antibiotics-as patient not a candidate for outpatient therapies.   Diet: Diet Order             Diet regular Room service appropriate? Yes;  Fluid consistency: Thin  Diet effective now                   MEDICATIONS: Scheduled Meds:  ARIPiprazole  15 mg Oral Daily   ascorbic acid  500 mg Oral Daily   Chlorhexidine Gluconate Cloth  6 each Topical Daily   cholecalciferol  1,000 Units Oral Daily   cyanocobalamin  1,000 mcg Subcutaneous Q30 days   docusate sodium  100 mg Oral BID   enoxaparin (LOVENOX) injection  40 mg Subcutaneous Q24H   feeding supplement  237 mL Oral TID BM   leptospermum manuka honey  1 Application Topical Daily   lidocaine (PF)  10 mL Infiltration Once   midodrine  10 mg Oral Q8H   multivitamin with minerals  1 tablet Oral Daily   nutrition supplement (JUVEN)  1 packet Oral BID BM   nystatin   Topical TID   pantoprazole  40 mg Oral Daily   sodium chloride  1 g Oral BID WC   sulfamethoxazole-trimethoprim  2 tablet Oral Q12H   Vashe Wound Irrigation Optime  34 oz Topical Once   vitamin E  400 Units Oral Daily   Continuous Infusions:  sodium chloride     ampicillin-sulbactam (UNASYN) IV 3 g (05/01/23 0558)   DAPTOmycin (CUBICIN) 600 mg in sodium chloride 0.9 %  IVPB 600 mg (04/30/23 1335)   PRN Meds:.acetaminophen, alum & mag hydroxide-simeth, baclofen, bisacodyl, calcium carbonate, magnesium citrate, methocarbamol **OR** [DISCONTINUED] methocarbamol (ROBAXIN) IV, [DISCONTINUED] metoCLOPramide **OR** metoCLOPramide (REGLAN) injection, morphine injection, ondansetron **OR** ondansetron (ZOFRAN) IV, mouth rinse, oxyCODONE, polyethylene glycol   I have personally reviewed following labs and imaging studies  LABORATORY DATA:  Recent Labs  Lab 04/25/23 0409 04/27/23 0430 04/29/23 0455 05/01/23 0416  WBC 13.8* 7.7 11.3* 6.3  HGB 9.0* 7.8* 7.6* 6.6*  HCT 28.9* 25.7* 24.5* 21.5*  PLT 306 334 334 331  MCV 89.5 92.1 92.5 93.1  MCH 27.9 28.0 28.7 28.6  MCHC 31.1 30.4 31.0 30.7  RDW 15.8* 16.0* 16.2* 16.7*    Recent Labs  Lab 04/25/23 0409 04/26/23 0330 04/27/23 0430 04/28/23 0644  04/29/23 0455 04/30/23 0428 05/01/23 0416  NA 128*   < > 131* 128* 129* 128* 132*  K 4.3   < > 4.5 4.1 3.9 4.2 4.3  CL 96*   < > 95* 98 99 98 102  CO2 23   < > 25 24 22 22 23   ANIONGAP 9   < > 11 6 8 8 7   GLUCOSE 141*   < > 96 97 133* 87 86  BUN 9   < > 16 12 15 12 13   CREATININE 0.65   < > 0.61 0.54* 0.58* 0.56* 0.54*  AST 13*  --   --   --   --   --  12*  ALT 14  --   --   --   --   --  12  ALKPHOS 105  --   --   --   --   --  81  BILITOT 0.1*  --   --   --   --   --  0.4  ALBUMIN <1.5*  --   --   --   --   --  1.6*  BNP  --   --  94.7 66.4 107.9* 31.5  --   MG 2.1  --  2.1 2.1 2.0 2.1  --   CALCIUM 7.6*   < > 7.6* 7.6* 7.5* 7.4* 7.5*   < > = values in this interval not displayed.    Recent Labs  Lab 04/25/23 0409 04/26/23 0330 04/27/23 0430 04/28/23 0644 04/29/23 0455 04/30/23 0428 05/01/23 0416  BNP  --   --  94.7 66.4 107.9* 31.5  --   MG 2.1  --  2.1 2.1 2.0 2.1  --   CALCIUM 7.6*   < > 7.6* 7.6* 7.5* 7.4* 7.5*   < > = values in this interval not displayed.     Sepsis Labs: Lactic Acid, Venous    Component Value Date/Time   LATICACIDVEN 2.1 (HH) 04/08/2023 2219    RADIOLOGY STUDIES/RESULTS: No results found.   LOS: 22 days   Signature  -    Susa Raring M.D on 05/01/2023 at 10:03 AM   -  To page go to [email or web address]

## 2023-05-01 NOTE — Plan of Care (Signed)

## 2023-05-01 NOTE — Progress Notes (Signed)
TRH night cross cover note:   I was notified by RN that the patient's morning labs are notable for hemoglobin of 6.6, down slightly from most recent prior hemoglobin level of 7.6 when checked on 04/29/2023.  No new symptoms reported at this time.  Vital signs notable for most recent blood pressure 120/70, heart rates in the low 100s.  Per chart review, it appears that he is received a total of 4 units of PRBC transfusion over the duration of his hospital course, with most recent transfusion occurring on 04/24/2023.  I have ordered an updated type and screen, and also ordered transfusion of 1 unit PRBC to occur over 3 hours, in addition to an order for a repeat H&H to be checked following completion of transfusion of this 1 unit PRBC.     Newton Pigg, DO Hospitalist

## 2023-05-02 DIAGNOSIS — F1510 Other stimulant abuse, uncomplicated: Secondary | ICD-10-CM | POA: Diagnosis not present

## 2023-05-02 DIAGNOSIS — B9562 Methicillin resistant Staphylococcus aureus infection as the cause of diseases classified elsewhere: Secondary | ICD-10-CM | POA: Diagnosis not present

## 2023-05-02 DIAGNOSIS — T148XXA Other injury of unspecified body region, initial encounter: Secondary | ICD-10-CM

## 2023-05-02 DIAGNOSIS — M86651 Other chronic osteomyelitis, right thigh: Secondary | ICD-10-CM | POA: Diagnosis not present

## 2023-05-02 DIAGNOSIS — R7881 Bacteremia: Secondary | ICD-10-CM | POA: Diagnosis not present

## 2023-05-02 LAB — CBC
HCT: 23.4 % — ABNORMAL LOW (ref 39.0–52.0)
Hemoglobin: 7.3 g/dL — ABNORMAL LOW (ref 13.0–17.0)
MCH: 28.6 pg (ref 26.0–34.0)
MCHC: 31.2 g/dL (ref 30.0–36.0)
MCV: 91.8 fL (ref 80.0–100.0)
Platelets: 318 10*3/uL (ref 150–400)
RBC: 2.55 MIL/uL — ABNORMAL LOW (ref 4.22–5.81)
RDW: 17.4 % — ABNORMAL HIGH (ref 11.5–15.5)
WBC: 5.7 10*3/uL (ref 4.0–10.5)
nRBC: 0 % (ref 0.0–0.2)

## 2023-05-02 LAB — BASIC METABOLIC PANEL
Anion gap: 7 (ref 5–15)
BUN: 12 mg/dL (ref 6–20)
CO2: 22 mmol/L (ref 22–32)
Calcium: 7.8 mg/dL — ABNORMAL LOW (ref 8.9–10.3)
Chloride: 103 mmol/L (ref 98–111)
Creatinine, Ser: 0.51 mg/dL — ABNORMAL LOW (ref 0.61–1.24)
GFR, Estimated: >60 mL/min (ref >60)
Glucose, Bld: 87 mg/dL (ref 70–99)
Potassium: 4.5 mmol/L (ref 3.5–5.1)
Sodium: 132 mmol/L — ABNORMAL LOW (ref 135–145)

## 2023-05-02 LAB — PREPARE RBC (CROSSMATCH)

## 2023-05-02 MED ORDER — SODIUM CHLORIDE 0.9% IV SOLUTION: Freq: Once | INTRAVENOUS | Status: AC

## 2023-05-02 NOTE — TOC Progression Note (Signed)
Transition of Care [facility]) - Progression Note    Patient Details  Name: Steve [name] MRN: [id number] Date of Birth: Dec 07, 1990  [facility] CM/SW Contact  Mearl Latin, LCSW Phone Number: 05/02/2023, 4:39 PM  Clinical Narrative:    CSW met with patient. He confirmed that he is planning on leaving the hospital tomorrow and someone is going to come pick him up. CSW spoke with him about setting up a PCP through the [place] to help unlock more services. CSW provided him with contact info and the application as well as contacts for representatives to assist with the process. He stated no other needs at this time.    Expected Discharge Plan: Home w Home Health Services Barriers to Discharge: Continued Medical Work up, English as a second language teacher, SNF Pending bed offer (Substance use history w/ need for PICC)  Expected Discharge Plan and Services In-house Referral: Clinical Social Work   Post Acute Care Choice: Skilled Nursing Facility Living arrangements for the past 2 months: Single Family Home                                       Social Determinants of Health (SDOH) Interventions SDOH Screenings   Food Insecurity: No Food Insecurity (06/25/2022)  Housing: Low Risk  (06/25/2022)  Transportation Needs: No Transportation Needs (06/25/2022)  Utilities: Not At Risk (06/25/2022)  Tobacco Use: High Risk (04/24/2023)    Readmission Risk Interventions    05/02/2023    4:35 PM 07/01/2022   10:10 AM 06/29/2022    9:19 AM  Readmission Risk Prevention Plan  Transportation Screening Complete Complete Complete  Medication Review Oceanographer) Complete Complete Complete  PCP or Specialist appointment within 3-5 days of discharge Complete Complete Complete  HRI or Home Care Consult Complete Complete Complete  SW Recovery Care/Counseling Consult Complete Complete Complete  Palliative Care Screening Not Applicable Not Applicable Not Applicable  Skilled Nursing Facility  Complete Not Applicable Not Applicable

## 2023-05-02 NOTE — Progress Notes (Signed)
Physical Therapy Progress Note Patient Details Name: Steve [name] MRN: [id number] DOB: [birth year] Today's Date: 05/02/2023   History of Present Illness 32 year old male presented with septic shock in the setting of polymicrobial bacteremia and acute on chronic osteomyelitis of sacral decubitus ulcers and left hip septic arthritis.  Initially admitted to the ICU-stabilized and subsequently transferred to [facility].  Evaluated by orthopedics-no surgical interventions recommended. Evaluated by ID-with plans for 6 weeks of IV daptomycin/Rocephin.  Unfortunately-further hospital course complicated by intermittent fevers-upon further evaluation with MRI pelvis-he was found to have abscess.PMH: paraplegia-known sacral decubitus ulcers with chronic osteomyelitis, substance abuse, chronic hypotension on midodrine.    PT Comments  Pt is progressing with goals. Goals were assessed and updated as appropriate this session. Worked on transfers and W/C mobility today. Pt continues to require assistance through pt states it is because he is in the hospital this should not be the case and pt is requiring a higher level of physical assistance than he will be able to perform himself without help. Pt requires physical assistance for transfers and for W/C mobility to prevent falls and wrecking W/C. W/C mobility may improve with the appropriate chair. Due to current functional status, home set up and available assistance at home (pt states he will do mobility himself) recommending skilled physical therapy services at a higher level of care and frequency (5x/week) in order to decrease risk for fall, injury, immobility, further skin break down and re-hospitalization.      If plan is discharge home, recommend the following: Assistance with cooking/housework;Help with stairs or ramp for entrance;A lot of help with walking and/or transfers;Assist for transportation   Can travel by private vehicle     No  Equipment  Recommendations  Other (comment);Hoyer lift;Hospital bed (roho cushion to protect skin)       Precautions / Restrictions Precautions Precautions: Fall Precaution Comments: poor trunk control, decreased BLE sensation Restrictions Weight Bearing Restrictions: No     Mobility  Bed Mobility Overal bed mobility: Needs Assistance Bed Mobility: Supine to Sit, Sit to Supine, Rolling Rolling: Min assist   Supine to sit: Supervision Sit to supine: Supervision   General bed mobility comments: Pt is now able to get BLE's into the bed at supervision level. Supervision for safety Patient Response: Cooperative, Flat affect  Transfers Overall transfer level: Needs assistance   Transfers: Bed to chair/wheelchair/BSC         Anterior-Posterior transfers: Contact guard assist  Lateral/Scoot Transfers: Min assist General transfer comment: Pt needs cues for correct set up in order to decrease level of assist required for lateral scoot/transfer. Pt has difficulty clearing the chair to scoot laterally back into the bed.    Ambulation/Gait     General Gait Details: Pt is non-ambulatory at baseline.     Merchant navy officer mobility: Yes Wheelchair propulsion: Both upper extremities Wheelchair parts: Needs assistance Distance: 50 ft 3x with rest breaks Wheelchair Assistance Details (indicate cue type and reason): Pt requires supervision for cues to avoid obstacles.   Tilt Bed Tilt Bed Patient Response: Cooperative, Flat affect       Balance Overall balance assessment: Needs assistance Sitting-balance support: Single extremity supported, Feet supported Sitting balance-Leahy Scale: Poor Sitting balance - Comments: multiple anterior LOB (pt hooking RUE behind his wheelchair back rest to prevent anterior LOB)      Cognition Arousal: Alert Behavior During Therapy: WFL for tasks assessed/performed Overall Cognitive Status: Within Functional Limits  for tasks assessed  General Comments: Pt frequently c/o feeling cold and asking to go outside where it is warmer.               Pertinent Vitals/Pain Pain Assessment Pain Assessment: No/denies pain     PT Goals (current goals can now be found in the care plan section) Acute Rehab PT Goals Patient Stated Goal: To go home to spouse PT Goal Formulation: With patient Time For Goal Achievement: 05/16/23 Potential to Achieve Goals: Fair Additional Goals Additional Goal #1: Pt will wheel himself in W/C 100 ft at Mod I to improve independence. Progress towards PT goals: Progressing toward goals    Frequency    Min 1X/week      PT Plan Current plan remains appropriate       AM-PAC PT "6 Clicks" Mobility   Outcome Measure  Help needed turning from your back to your side while in a flat bed without using bedrails?: A Little Help needed moving from lying on your back to sitting on the side of a flat bed without using bedrails?: A Little Help needed moving to and from a bed to a chair (including a wheelchair)?: A Little Help needed standing up from a chair using your arms (e.g., wheelchair or bedside chair)?: Total Help needed to walk in hospital room?: Total Help needed climbing 3-5 steps with a railing? : Total 6 Click Score: 12    End of Session Equipment Utilized During Treatment: Other (comment) (draw pads) Activity Tolerance: Patient tolerated treatment well Patient left: in bed;with call bell/phone within reach;with bed alarm set Nurse Communication: Mobility status PT Visit Diagnosis: Other abnormalities of gait and mobility (R26.89)     Time: 1610-9604 PT Time Calculation (min) (ACUTE ONLY): 40 min  Charges:    $Therapeutic Activity: 38-52 mins PT General Charges $$ ACUTE PT VISIT: 1 Visit                     Steve [name], DPT, CLT  Acute Rehabilitation Services Office: [phone] (Secure chat preferred)    Steve [name] 05/02/2023,  4:17 PM

## 2023-05-02 NOTE — Progress Notes (Signed)
[facility]  Date of Admission:  04/08/2023      Total days of antibiotics 23             ASSESSMENT: Steve [name] is a 32 y.o. male admitted with acute on chronic hip infections with chronic osteomyelitis.   A/C Osteomyelitis and Septic Arthritis B/L Hips -  S/P Debridement of Abscesses Lajoyce Corners) 04/24/2023 WCx growing Proteus Mirabilis, Acinetobacter Baummii (sensitive), Enterococcus Avium, Bacteroides, Prevotela, Providencia Stuartii, VRE, Enterococcus Gallinarum  -we brought up a supply of PO augmentin, ciprofloxacin and linezolid to cover polymicrobial infection. Will need prolonged course from his last surgery 8/7. EOT anticipated 9/17.   H/O Bactrim Reaction -  -Not specified and he does not recall.  -tolerated fine, but with AMA discharge changing to alternative regimen    AMA discharge 8/16 -  -I tried calling his wife, Eber Jones to give appointment details. I left her a message to call our clinic back. He will need a CBC in 2 weeks at appointment with me on 05/16/23 @ 3:30 pm    PLAN: TOC antibiotics placed in his patient belonging bag in cubby - messaged his nurse today also to inform of this.  D/C ABX - oral linezolid, augmentin, ciprofloxacin EOT 9/17 for 6 week course following surgery  Needs CBC at appt with me 8/29 - message left with his wife to call clinic for appt details.    ID will sign off - please call back with any questions/concerns or if we can be of further assistance.    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) Active Problems:   Amphetamine abuse (HCC)   MRSA bacteremia   Septic shock (HCC)   Septic arthritis (HCC)   Pressure ulcer   Wound infection   Polymicrobial bacterial infection    ARIPiprazole  15 mg Oral Daily   ascorbic acid  500 mg Oral Daily   Chlorhexidine Gluconate Cloth  6 each Topical Daily   cholecalciferol  1,000 Units Oral Daily    cyanocobalamin  1,000 mcg Subcutaneous Q30 days   docusate sodium  100 mg Oral BID   enoxaparin (LOVENOX) injection  40 mg Subcutaneous Q24H   feeding supplement  237 mL Oral TID BM   leptospermum manuka honey  1 Application Topical Daily   lidocaine (PF)  10 mL Infiltration Once   midodrine  10 mg Oral Q8H   multivitamin with minerals  1 tablet Oral Daily   nutrition supplement (JUVEN)  1 packet Oral BID BM   nystatin   Topical TID   pantoprazole  40 mg Oral Daily   sodium chloride  1 g Oral BID WC   sulfamethoxazole-trimethoprim  2 tablet Oral Q12H   Vashe Wound Irrigation Optime  34 oz Topical Once   vitamin E  400 Units Oral Daily    SUBJECTIVE: Difficult to wake up. Nods no when we ask if he has had any reactions to bactrim / sulfa in the past.    Review of Systems: Review of Systems  Constitutional:  Negative for chills and fever.  Gastrointestinal:  Negative for abdominal pain, nausea and vomiting.  Skin:  Negative for rash.    Allergies  Allergen Reactions   Caffeine Anaphylaxis   Chocolate Anaphylaxis   Bactrim [Sulfamethoxazole-Trimethoprim] Other (See Comments)    Reaction not listed.   Tuberculin, Ppd     Other reaction(s): Eruption of skin   Sulfa  Antibiotics Rash    OBJECTIVE: Vitals:   05/01/23 1940 05/02/23 0000 05/02/23 1030 05/02/23 1045  BP: 116/66 (!) 98/51 121/78 125/84  Pulse: 79  98 72  Resp: 20 16 20  (!) 22  Temp: 97.6 F (36.4 C) 97.6 F (36.4 C) 97.7 F (36.5 C) 97.9 F (36.6 C)  TempSrc: Oral Oral Oral Oral  SpO2: 96%   100%  Weight:      Height:       Body mass index is 22.17 kg/m.  Physical Exam Vitals reviewed.  Constitutional:      Appearance: He is ill-appearing.     Comments: Lethargic   Skin:    General: Skin is warm and dry.     Lab Results Lab Results  Component Value Date   WBC 5.7 05/02/2023   HGB 7.3 (L) 05/02/2023   HCT 23.4 (L) 05/02/2023   MCV 91.8 05/02/2023   PLT 318 05/02/2023    Lab Results   Component Value Date   CREATININE 0.51 (L) 05/02/2023   BUN 12 05/02/2023   NA 132 (L) 05/02/2023   K 4.5 05/02/2023   CL 103 05/02/2023   CO2 22 05/02/2023    Lab Results  Component Value Date   ALT 12 05/01/2023   AST 12 (L) 05/01/2023   ALKPHOS 81 05/01/2023   BILITOT 0.4 05/01/2023     Microbiology: Recent Results (from the past 240 hour(s))  Surgical pcr screen     Status: None   Collection Time: 04/24/23  9:15 AM   Specimen: Nasal Mucosa; Nasal Swab  Result Value Ref Range Status   MRSA, PCR NEGATIVE NEGATIVE Final   Staphylococcus aureus NEGATIVE NEGATIVE Final    Comment: (NOTE) The Xpert SA Assay (FDA approved for NASAL specimens in patients 75 years of age and older), is one component of a comprehensive surveillance program. It is not intended to diagnose infection nor to guide or monitor treatment. Performed at [facility], [address] [place], [place] [address]   Aerobic/Anaerobic Culture w Gram Stain (surgical/deep wound)     Status: None   Collection Time: 04/24/23 11:16 AM   Specimen: Wound; Tissue  Result Value Ref Range Status   Specimen Description TISSUE LEFT HIP  Final   Special Requests PT ON VANC, ANCEF  Final   Gram Stain   Final    NO WBC SEEN FEW GRAM NEGATIVE RODS RARE GRAM POSITIVE COCCI    Culture   Final    MODERATE PROTEUS MIRABILIS MODERATE ESCHERICHIA COLI MODERATE PROVIDENCIA STUARTII FEW ENTEROCOCCUS GALLINARUM VANCOMYCIN RESISTANT ENTEROCOCCUS ABUNDANT BACTEROIDES VULGATUS ABUNDANT PREVOTELLA DENTICOLA BETA LACTAMASE POSITIVE Performed at [facility], [address] [place], [place] [address]    Report Status 04/27/2023 FINAL  Final   Organism ID, Bacteria PROTEUS MIRABILIS  Final   Organism ID, Bacteria ESCHERICHIA COLI  Final   Organism ID, Bacteria PROVIDENCIA STUARTII  Final   Organism ID, Bacteria ENTEROCOCCUS GALLINARUM  Final      Susceptibility   Escherichia coli - MIC*    AMPICILLIN >=32  RESISTANT Resistant     CEFEPIME <=0.12 SENSITIVE Sensitive     CEFTAZIDIME <=1 SENSITIVE Sensitive     CEFTRIAXONE <=0.25 SENSITIVE Sensitive     CIPROFLOXACIN >=4 RESISTANT Resistant     GENTAMICIN <=1 SENSITIVE Sensitive     IMIPENEM <=0.25 SENSITIVE Sensitive     TRIMETH/SULFA >=320 RESISTANT Resistant     AMPICILLIN/SULBACTAM 4 SENSITIVE Sensitive     PIP/TAZO <=4  SENSITIVE Sensitive     * MODERATE ESCHERICHIA COLI   Enterococcus gallinarum - MIC*    AMPICILLIN <=2 SENSITIVE Sensitive     VANCOMYCIN RESISTANT Resistant     GENTAMICIN SYNERGY SENSITIVE Sensitive     * FEW ENTEROCOCCUS GALLINARUM   Proteus mirabilis - MIC*    AMPICILLIN <=2 SENSITIVE Sensitive     CEFEPIME <=0.12 SENSITIVE Sensitive     CEFTAZIDIME <=1 SENSITIVE Sensitive     CEFTRIAXONE <=0.25 SENSITIVE Sensitive     CIPROFLOXACIN <=0.25 SENSITIVE Sensitive     GENTAMICIN <=1 SENSITIVE Sensitive     IMIPENEM 2 SENSITIVE Sensitive     TRIMETH/SULFA <=20 SENSITIVE Sensitive     AMPICILLIN/SULBACTAM <=2 SENSITIVE Sensitive     PIP/TAZO <=4 SENSITIVE Sensitive     * MODERATE PROTEUS MIRABILIS   Providencia stuartii - MIC*    AMPICILLIN >=32 RESISTANT Resistant     CEFEPIME <=0.12 SENSITIVE Sensitive     CEFTAZIDIME <=1 SENSITIVE Sensitive     CEFTRIAXONE <=0.25 SENSITIVE Sensitive     CIPROFLOXACIN <=0.25 SENSITIVE Sensitive     GENTAMICIN RESISTANT Resistant     IMIPENEM 2 SENSITIVE Sensitive     TRIMETH/SULFA <=20 SENSITIVE Sensitive     AMPICILLIN/SULBACTAM >=32 RESISTANT Resistant     PIP/TAZO <=4 SENSITIVE Sensitive     * MODERATE PROVIDENCIA STUARTII  Aerobic/Anaerobic Culture w Gram Stain (surgical/deep wound)     Status: None   Collection Time: 04/24/23 11:18 AM   Specimen: Wound; Tissue  Result Value Ref Range Status   Specimen Description TISSUE RIGHT HIP  Final   Special Requests PT ON ANCEF,VANC  Final   Gram Stain   Final    ABUNDANT WBC PRESENT, PREDOMINANTLY PMN RARE GRAM POSITIVE  COCCI ABUNDANT GRAM NEGATIVE RODS    Culture   Final    MODERATE PROTEUS MIRABILIS MODERATE ACINETOBACTER CALCOACETICUS/BAUMANNII COMPLEX FEW ENTEROCOCCUS AVIUM ABUNDANT BACTEROIDES SPECIES ABUNDANT PREVOTELLA DENTICOLA BETA LACTAMASE POSITIVE Performed at [facility], [address] [place], [place] [address]    Report Status 04/27/2023 FINAL  Final   Organism ID, Bacteria PROTEUS MIRABILIS  Final   Organism ID, Bacteria ACINETOBACTER CALCOACETICUS/BAUMANNII COMPLEX  Final   Organism ID, Bacteria ENTEROCOCCUS AVIUM  Final      Susceptibility   Acinetobacter calcoaceticus/baumannii complex - MIC*    CEFTAZIDIME 4 SENSITIVE Sensitive     CIPROFLOXACIN <=0.25 SENSITIVE Sensitive     GENTAMICIN <=1 SENSITIVE Sensitive     IMIPENEM <=0.25 SENSITIVE Sensitive     PIP/TAZO <=4 SENSITIVE Sensitive     TRIMETH/SULFA <=20 SENSITIVE Sensitive     AMPICILLIN/SULBACTAM <=2 SENSITIVE Sensitive     * MODERATE ACINETOBACTER CALCOACETICUS/BAUMANNII COMPLEX   Enterococcus avium - MIC*    AMPICILLIN <=2 SENSITIVE Sensitive     VANCOMYCIN <=0.5 SENSITIVE Sensitive     GENTAMICIN SYNERGY SENSITIVE Sensitive     * FEW ENTEROCOCCUS AVIUM   Proteus mirabilis - MIC*    AMPICILLIN <=2 SENSITIVE Sensitive     CEFEPIME <=0.12 SENSITIVE Sensitive     CEFTAZIDIME <=1 SENSITIVE Sensitive     CEFTRIAXONE <=0.25 SENSITIVE Sensitive     CIPROFLOXACIN <=0.25 SENSITIVE Sensitive     GENTAMICIN <=1 SENSITIVE Sensitive     IMIPENEM 2 SENSITIVE Sensitive     TRIMETH/SULFA <=20 SENSITIVE Sensitive     AMPICILLIN/SULBACTAM <=2 SENSITIVE Sensitive     PIP/TAZO <=4 SENSITIVE Sensitive     * MODERATE PROTEUS MIRABILIS     Rexene Alberts, MSN, NP-C [facility]  [email or web address] Pager: [phone] Office: [phone] [facility] Main Line: [phone] *Secure Chat Communication Welcome   Total encounter time - 24 minutes

## 2023-05-02 NOTE — Progress Notes (Signed)
PROGRESS NOTE        PATIENT DETAILS Name: Steve [name] Age: 32 y.o. Sex: male Date of Birth: Jun 23, 1991 Admit Date: 04/08/2023 Admitting Physician Briant Sites, DO WUJ:[id number], [facility]  Brief Summary: 32 year old male with with history of paraplegia-known sacral decubitus ulcers with chronic osteomyelitis, substance abuse, chronic hypotension on midodrine-presented with septic shock in the setting of polymicrobial bacteremia and acute on chronic osteomyelitis of sacral decubitus ulcers and left hip septic arthritis.  Initially admitted to the ICU-stabilized and subsequently transferred to [facility].  Evaluated by orthopedics-no surgical interventions recommended.  Evaluated by ID-with plans for 6 weeks of IV daptomycin/Rocephin.  Unfortunately-further hospital course complicated by intermittent fevers-upon further evaluation with MRI pelvis-he was found to have abscess.  See below for further details.     Social worker following for placement-as patient is not a candidate for outpatient IV antibiotics (history of drug use)Patient is a 32 y.o.  male    Significant studies: 7/22>> CT abdomen/pelvis: Septic arthritis left hip with osteomyelitis of left ischium/inferior pubic rami.  Posterior left hip dislocation. 7/23>> TTE: EF 60-65% 8/02>> CT pelvis: Unchanged left hip dislocation-chronic osteomyelitis left pelvis related to ischemic ulcer, possible ill-defined fluid collection adjacent to the ulcer. 8/02>> bilateral lower extremity Doppler: No DVT 8/03>> MRI pelvis: Rim enhancing collection posterior right proximal femur concerning for abscess, multiple small abscesses around proximal left femur  Significant microbiology data: 7/22>> blood culture: Staph aureus, group C streptococcus, Proteus 7/23>> blood culture: Proteus/E. Coli 7/24>> blood culture: No growth 8/01>> blood culture: Peptostreptococcus, Bacteroides 8/01>> blood culture: No  growth 8/07>> intraoperative tissue culture right hip: Proteus Mirabilis 8/07>> intraoperative tissue culture left hip: Proteus mirabilis  Procedures: 7/29>> PICC by IR 8/07>> bilateral hip excisional debridement  Consults: PCCM ID Ortho  Subjective: Patient in bed, appears comfortable, denies any headache, no fever, no chest pain or pressure, no shortness of breath , no abdominal pain. No new focal weakness.  Patient has informed the staff that he will be leaving AMA on the coming Friday when he has a ride to go back home.  He does not want to stay in the hospital any longer, he has zooms all responsibility for any adverse outcome.  He was extensively counseled by me multiple times.   Objective: Vitals: Blood pressure (!) 98/51, pulse 79, temperature 97.6 F (36.4 C), temperature source Oral, resp. rate 16, height 5\' 11"  (1.803 m), weight 72.1 kg, SpO2 96%.   Exam:  Awake Alert, diffuse weakness, functional quadriplegia, R. arm PICC line, Foley catheter [place].AT,PERRAL Supple Neck, No JVD,   Symmetrical Chest wall movement, Good air movement bilaterally, CTAB RRR,No Gallops, Rubs or new Murmurs,  +ve B.Sounds, Abd Soft, No tenderness,   No Cyanosis, Clubbing or edema   Assessment/Plan:  Septic shock-POA Acute on chronic osteomyelitis septic arthritis of the left hip  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 S/p surgical debridement of bilateral hips on 8/7 (intraoperative cultures positive for Proteus).  TSH on 8/1 stable, a.m. cortisol in 8/2 stable as well.  If hypothermia persists-repeat a.m. cortisol tomorrow. ID following-remains on daptomycin/ceftriaxone/metronidazole, defer end dates and antibiotic regimen to ID.   Use  warming  blanket as needed for mild hypothermia, stays uncovered in the bed, counseled, keep room warm, TSH and random cortisol stable. Per ID-TEE not needed as patient will be on a prolonged course of antibiotics.  Note-not a candidate for home IV infusion therapies-Per social work-[facility]-and hence will remain inpatient here at [facility] to complete course of IV antibiotics.   Note patient has informed the staff earlier this morning that he will be leaving AMA on coming Friday when he has a ride back home, he wanted to leave AMA today but did not have a ride, he has talked to his spouse about it as well.   Acute on chronic normocytic anemia  Secondary to acute illness-superimposed on anemia of chronic disease, He is receiving 1 unit of packed RBC on 05/01/2023 and 1 more unit on 05/02/2023, no signs of bleeding, denies any blood in stool or melena, continue to monitor.      Hypotension Am cortisol on 8/2 was > 20 but subsequently much lower, Cosyntropin cosyntropin stimulation test is stable but borderline normal with 60-minute cortisol level 18.5, will continue to monitor will have low threshold of therapeutic trial with steroids in the future. midodrine and albumin for now.  Further diuretics.   Hyponatremia Likely SIADH even after fluid restriction and Lasix persistent hyponatremia much improved after Samsca on 04/30/2023.   Normocytic anemia Due to acute illness/chronic inflammation from sacral osteomyelitis/ulcers Continue to Hb-required PRBC transfusion on 8/4.   GERD PPI   Vitamin D deficiency continue with supplements   Vitamin B12 deficiency Continue supplementation   Vitamin A, C, D deficiency, zinc deficiency Continue with supplements   Neurogenic bladder/UTI Paraplegia Continue Foley, urine culture growing Proteus, continue with Rocephin   Schizophrenia/PTSD Seems stable Continue Abilify   Substance abuse  Self reports use of  methamphetamines    3 mm left solid pulmonary nodule Further workup deferred to outpatient setting-as this is an incidental finding  R Amrm PICC, Foley catheter   Nutrition Status: Nutrition Problem: Inadequate oral intake Etiology: decreased appetite Signs/Symptoms: per patient/family report Interventions: Ensure Enlive (each supplement provides 350kcal and 20 grams of protein), MVI, Juven, Snacks    Pressure Ulcer: Pressure Injury 05/30/21 Ankle Left;Lateral Unstageable - Full thickness tissue loss in which the base of the injury is covered by slough (yellow, tan, gray, green or brown) and/or eschar (tan, brown or black) in the wound bed. (Active)  05/30/21 1938  Location: Ankle  Location Orientation: Left;Lateral  Staging: Unstageable - Full thickness tissue loss in which the base of the injury is covered by slough (yellow, tan, gray, green or brown) and/or eschar (tan, brown or black) in the wound bed.  Wound Description (Comments):   Present on Admission: Yes  Dressing Type Foam - Lift dressing to assess site every shift 05/02/23 0627     Pressure Injury 08/29/21 Ischial tuberosity Left Stage 4 - Full thickness tissue loss with exposed bone, tendon or muscle. 1O1W9 (Active)  08/29/21 0100  Location: Ischial tuberosity  Location Orientation: Left  Staging: Stage 4 - Full thickness tissue loss with exposed bone, tendon or muscle.  Wound Description (Comments): [id number]  Present on Admission: Yes  Dressing Type Foam - Lift dressing to assess site every shift;Gauze (Comment) 05/02/23 0627     Pressure Injury 04/09/23 Pretibial Right;Lateral Stage 2 -  Partial thickness loss of dermis presenting as a shallow open injury with a red, pink wound bed without slough. full thickness, NOT a  pressure injury (Active)  04/09/23 0351  Location: Pretibial  Location Orientation: Right;Lateral  Staging: Stage 2 -  Partial thickness loss of dermis presenting as a shallow open injury with a red,  pink wound bed without slough.  Wound Description (Comments): full thickness, NOT a pressure injury  Present on Admission: Yes  Dressing Type Foam - Lift dressing to assess site every shift 05/02/23 0627     Pressure Injury 04/09/23 Ischial tuberosity Right Stage 4 - Full thickness tissue loss with exposed bone, tendon or muscle. (Active)  04/09/23 0100  Location: Ischial tuberosity  Location Orientation: Right  Staging: Stage 4 - Full thickness tissue loss with exposed bone, tendon or muscle.  Wound Description (Comments):   Present on Admission: Yes  Dressing Type Foam - Lift dressing to assess site every shift;Gauze (Comment) 05/02/23 0627     Pressure Injury 04/09/23 Elbow Left;Posterior Stage 2 -  Partial thickness loss of dermis presenting as a shallow open injury with a red, pink wound bed without slough. (Active)  04/09/23 0330  Location: Elbow  Location Orientation: Left;Posterior  Staging: Stage 2 -  Partial thickness loss of dermis presenting as a shallow open injury with a red, pink wound bed without slough.  Wound Description (Comments):   Present on Admission: Yes  Dressing Type Foam - Lift dressing to assess site every shift 05/02/23 0627    BMI : Estimated body mass index is 22.17 kg/m as calculated from the following:   Height as of this encounter: 5\' 11"  (1.803 m).   Weight as of this encounter: 72.1 kg.   Code status:   Code Status: Full Code   DVT Prophylaxis: SCDs Start: 04/24/23 1718 enoxaparin (LOVENOX) injection 40 mg Start: 04/16/23 2200 SCDs Start: 04/09/23 0140   Family Communication: None at bedside   Disposition Plan: Status is: Inpatient Remains inpatient appropriate because: Severity of illness   Planned Discharge Destination:Home if willing but will remain inpatient to complete approximately 6 weeks of IV antibiotics-as patient not a candidate for outpatient therapies.   Diet: Diet Order             Diet regular Room service  appropriate? Yes; Fluid consistency: Thin  Diet effective now                   MEDICATIONS: Scheduled Meds:  sodium chloride   Intravenous Once   ARIPiprazole  15 mg Oral Daily   ascorbic acid  500 mg Oral Daily   Chlorhexidine Gluconate Cloth  6 each Topical Daily   cholecalciferol  1,000 Units Oral Daily   cyanocobalamin  1,000 mcg Subcutaneous Q30 days   docusate sodium  100 mg Oral BID   enoxaparin (LOVENOX) injection  40 mg Subcutaneous Q24H   feeding supplement  237 mL Oral TID BM   leptospermum manuka honey  1 Application Topical Daily   lidocaine (PF)  10 mL Infiltration Once   midodrine  10 mg Oral Q8H   multivitamin with minerals  1 tablet Oral Daily   nutrition supplement (JUVEN)  1 packet Oral BID BM   nystatin   Topical TID   pantoprazole  40 mg Oral Daily   sodium chloride  1 g Oral BID WC   sulfamethoxazole-trimethoprim  2 tablet Oral Q12H   Vashe Wound Irrigation Optime  34 oz Topical Once   vitamin E  400 Units Oral Daily   Continuous Infusions:  sodium chloride     ampicillin-sulbactam (UNASYN) IV 3 g (05/02/23 1610)  DAPTOmycin (CUBICIN) 600 mg in sodium chloride 0.9 % IVPB 600 mg (05/01/23 1341)   PRN Meds:.acetaminophen, alum & mag hydroxide-simeth, baclofen, bisacodyl, calcium carbonate, magnesium citrate, methocarbamol **OR** [DISCONTINUED] methocarbamol (ROBAXIN) IV, [DISCONTINUED] metoCLOPramide **OR** metoCLOPramide (REGLAN) injection, morphine injection, ondansetron **OR** ondansetron (ZOFRAN) IV, mouth rinse, oxyCODONE, polyethylene glycol   I have personally reviewed following labs and imaging studies  LABORATORY DATA:  Recent Labs  Lab 04/27/23 0430 04/29/23 0455 05/01/23 0416 05/02/23 0730  WBC 7.7 11.3* 6.3 5.7  HGB 7.8* 7.6* 6.6* 7.3*  HCT 25.7* 24.5* 21.5* 23.4*  PLT 334 334 331 318  MCV 92.1 92.5 93.1 91.8  MCH 28.0 28.7 28.6 28.6  MCHC 30.4 31.0 30.7 31.2  RDW 16.0* 16.2* 16.7* 17.4*    Recent Labs  Lab 04/27/23 0430  04/28/23 0644 04/29/23 0455 04/30/23 0428 05/01/23 0416 05/02/23 0332  NA 131* 128* 129* 128* 132* 132*  K 4.5 4.1 3.9 4.2 4.3 4.5  CL 95* 98 99 98 102 103  CO2 25 24 22 22 23 22   ANIONGAP 11 6 8 8 7 7   GLUCOSE 96 97 133* 87 86 87  BUN 16 12 15 12 13 12   CREATININE 0.61 0.54* 0.58* 0.56* 0.54* 0.51*  AST  --   --   --   --  12*  --   ALT  --   --   --   --  12  --   ALKPHOS  --   --   --   --  81  --   BILITOT  --   --   --   --  0.4  --   ALBUMIN  --   --   --   --  1.6*  --   BNP 94.7 66.4 107.9* 31.5  --   --   MG 2.1 2.1 2.0 2.1  --   --   CALCIUM 7.6* 7.6* 7.5* 7.4* 7.5* 7.8*    Recent Labs  Lab 04/27/23 0430 04/28/23 0644 04/29/23 0455 04/30/23 0428 05/01/23 0416 05/02/23 0332  BNP 94.7 66.4 107.9* 31.5  --   --   MG 2.1 2.1 2.0 2.1  --   --   CALCIUM 7.6* 7.6* 7.5* 7.4* 7.5* 7.8*     Sepsis Labs: Lactic Acid, Venous    Component Value Date/Time   LATICACIDVEN 2.1 (HH) 04/08/2023 2219    RADIOLOGY STUDIES/RESULTS: No results found.   LOS: 23 days   Signature  -    Susa Raring M.D on 05/02/2023 at 8:22 AM   -  To page go to [email or web address]

## 2023-05-02 NOTE — Plan of Care (Signed)

## 2023-05-02 NOTE — Progress Notes (Signed)
Pharmacy: Antimicrobial Stewardship Note  69 YOM who plans to leave AMA from the hospital on Friday, 05/03/23 when he was able to acquire a ride.   The patient has had multiple organisms that have grown from his cultures this admission making it difficult to come up with a good oral antibiotic regimen. Scripts for Augmentin + Cipro + Linezolid were delivered to bedside. The patient is aware that IV therapy in the hospitalized setting is preferred and has been counseled on this.   The patient will have a follow-up appointment made at the ID clinic prior to discharge tomorrow.   Thank you for allowing pharmacy to be a part of this patient's care.  Georgina Pillion, PharmD, BCPS, BCIDP Infectious Diseases Clinical Pharmacist 05/02/2023 11:42 AM   **Pharmacist phone directory can now be found on [email or web address] (PW [email or web address]).  Listed under [facility].

## 2023-05-03 DIAGNOSIS — M86651 Other chronic osteomyelitis, right thigh: Secondary | ICD-10-CM | POA: Diagnosis not present

## 2023-05-03 LAB — CBC
HCT: 26.4 % — ABNORMAL LOW (ref 39.0–52.0)
Hemoglobin: 8.2 g/dL — ABNORMAL LOW (ref 13.0–17.0)
MCH: 28 pg (ref 26.0–34.0)
MCHC: 31.1 g/dL (ref 30.0–36.0)
MCV: 90.1 fL (ref 80.0–100.0)
Platelets: 347 10*3/uL (ref 150–400)
RBC: 2.93 MIL/uL — ABNORMAL LOW (ref 4.22–5.81)
RDW: 16.8 % — ABNORMAL HIGH (ref 11.5–15.5)
WBC: 6.2 10*3/uL (ref 4.0–10.5)
nRBC: 0 % (ref 0.0–0.2)

## 2023-05-03 LAB — TYPE AND SCREEN
ABO/RH(D): A POS
Antibody Screen: NEGATIVE
Unit division: 0
Unit division: 0

## 2023-05-03 LAB — BPAM RBC
Blood Product Expiration Date: 202408172359
Blood Product Expiration Date: 202408312359
ISSUE DATE / TIME: 202408140916
ISSUE DATE / TIME: 202408151019
Unit Type and Rh: 600
Unit Type and Rh: 6200

## 2023-05-03 LAB — BASIC METABOLIC PANEL
Anion gap: 9 (ref 5–15)
BUN: 14 mg/dL (ref 6–20)
CO2: 21 mmol/L — ABNORMAL LOW (ref 22–32)
Calcium: 7.9 mg/dL — ABNORMAL LOW (ref 8.9–10.3)
Chloride: 100 mmol/L (ref 98–111)
Creatinine, Ser: 0.77 mg/dL (ref 0.61–1.24)
GFR, Estimated: >60 mL/min (ref >60)
Glucose, Bld: 80 mg/dL (ref 70–99)
Potassium: 4.5 mmol/L (ref 3.5–5.1)
Sodium: 130 mmol/L — ABNORMAL LOW (ref 135–145)

## 2023-05-03 LAB — CK: Total CK: 14 U/L — ABNORMAL LOW (ref 49–397)

## 2023-05-03 MED ORDER — FUROSEMIDE 10 MG/ML IJ SOLN
20.0000 mg | Freq: Once | INTRAMUSCULAR | Status: AC
  Administered 2023-05-03: 20 mg via INTRAVENOUS
  Filled 2023-05-03: qty 2

## 2023-05-03 NOTE — Progress Notes (Signed)
      INFECTIOUS DISEASE ATTENDING ADDENDUM:   Date: 05/03/2023  Patient name: Steve [name]  Medical record number: [id number]  Date of birth: 1990-12-17   Patient did not DC with oral antibiotics that we provided and I placed in his bag with clothing and shoes  I am also supportive of him staying in the hospital to complete IV therapy if that is path he wishes to pursue  I am going to take him off our rounding list for now.  Please call us back if we need to re-engage   Paulette Blanch Dam 05/03/2023, 5:08 PM

## 2023-05-03 NOTE — Progress Notes (Signed)
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  Initially admitted to the ICU-stabilized and subsequently transferred to [facility].  Evaluated by orthopedics-no surgical interventions recommended. Evaluated by ID-with plans for 6 weeks of IV daptomycin/Rocephin.  Unfortunately-further hospital course complicated by intermittent fevers-upon further evaluation with MRI pelvis-he was found to have abscess.PMH: paraplegia-known sacral decubitus ulcers with chronic osteomyelitis, substance abuse, chronic hypotension on midodrine.   OT comments  Pt. Seen for skilled OT treatment session.  Focus on session was review of BUE HEP for cont. Ue strengthening.  Theraband not in room so provided additional theraband for pt.  Pt. Able to return demo with good technique.  Cont. With current acute ot poc while pt. Here.  Pt. Reports that he is going home later today.        If plan is discharge home, recommend the following:  Two people to help with walking and/or transfers;A lot of help with bathing/dressing/bathroom;Assistance with cooking/housework;Direct supervision/assist for medications management;Assist for transportation;Help with stairs or ramp for entrance   Equipment Recommendations       Recommendations for Other Services      Precautions / Restrictions Precautions Precautions: Fall Precaution Comments: poor trunk control, decreased BLE sensation       Mobility Bed Mobility                    Transfers                         Balance                                           ADL either performed or assessed with clinical judgement   ADL Overall ADL's : Needs  assistance/impaired                               Toileting - Clothing Manipulation Details (indicate cue type and reason): pt. had bm bed level. requesting assistance for pericare.  pt able to reach with provide wash cloth but therapist asst. also assisted for thoroughness.            Extremity/Trunk Assessment              Vision       Perception     Praxis      Cognition Arousal: Alert                                              Exercises General Exercises - Upper Extremity Shoulder Horizontal ABduction: Strengthening, Both, 10 reps, Theraband Theraband Level (Shoulder Horizontal Abduction): Level 1 (Yellow) Elbow Flexion: Strengthening, Both, 20 reps, Theraband Theraband Level (Elbow Flexion): Level 1 (Yellow) Elbow Extension: Strengthening, Both, 20 reps, Theraband Theraband Level (Elbow Extension): Level 1 (Yellow)    Shoulder Instructions       General Comments      Pertinent Vitals/ Pain       Pain Assessment Pain Assessment: No/denies  pain  Home Living                                          Prior Functioning/Environment              Frequency  Min 1X/week        Progress Toward Goals  OT Goals(current goals can now be found in the care plan section)  Progress towards OT goals: Progressing toward goals     Plan      Co-evaluation                 AM-PAC OT "6 Clicks" Daily Activity     Outcome Measure   Help from another person eating meals?: None Help from another person taking care of personal grooming?: A Little Help from another person toileting, which includes using toliet, bedpan, or urinal?: A Lot Help from another person bathing (including washing, rinsing, drying)?: A Lot Help from another person to put on and taking off regular upper body clothing?: A Little Help from another person to put on and taking off regular lower body clothing?: A Lot 6 Click Score:  16    End of Session    OT Visit Diagnosis: Other abnormalities of gait and mobility (R26.89);Pain;Other (comment)   Activity Tolerance Patient tolerated treatment well   Patient Left in bed;with call bell/phone within reach   Nurse Communication          Time: 4132-4401 OT Time Calculation (min): 15 min  Charges: OT General Charges $OT Visit: 1 Visit OT Treatments $Therapeutic Exercise: 8-22 mins  Boneta Lucks, COTA/L Acute Rehabilitation [phone]   Alessandra Bevels Lorraine-COTA/L 05/03/2023, 1:27 PM

## 2023-05-03 NOTE — Progress Notes (Signed)
Nutrition Follow-up  DOCUMENTATION CODES:   Not applicable  INTERVENTION:  Juven BID, each packet provides 95 calories, 2.5 grams of protein (collagen), and 9.8 grams of carbohydrate (3 grams sugar); also contains 7 grams of L-arginine and L-glutamine, 300 mg vitamin C, 15 mg vitamin E, 1.2 mcg vitamin B-12, 9.5 mg zinc, 200 mg calcium, and 1.5 g  Calcium Beta-hydroxy-Beta-methylbutyrate to support wound healing Ensure Enlive po TID, each supplement provides 350 kcal and 20 grams of protein. Snacks - BID Multivitamin w/ minerals daily Continue Vitamin C and Vitamin D supplementation and recommend recheck once completed to assess values. Recommend discontinuing Vitamin E supplementation   NUTRITION DIAGNOSIS:   Inadequate oral intake related to decreased appetite as evidenced by per patient/family report. - Progressing   GOAL:   Patient will meet greater than or equal to 90% of their needs - Ongoing   MONITOR:   PO intake, Supplement acceptance, Labs, I & O's, Skin  REASON FOR ASSESSMENT:   Rounds (significant wounds)    ASSESSMENT:   Pt with hx of paraplegia, chronic pelvic osteomyelitis, paranoid schizophrenia, drug abuse, and PTSD presented to ED with several days of abdominal pain and purulent drainage from wounds.Found to be in septic shock  7/22 - Admitted  7/24 - transferred to the floor 8/07 - Op, s/p I&D bilateral hip  Pt laying in bed. Reports eating ok still, no changes in appetite. Denies any nausea or vomiting. Still drinking the Ensure, does not think that he has been drinking the Juven. RD encouraged pt to try the Juven again as it helps with wound healing.   Vitamin E supplementation ordered by previous provider. Reached out to MD about discontinuing due to past recommended 14 day supplementation.   Ongoing need for IV antibiotics.   Meal Intake 7/30-8/7: 0-100% x 8 meals (average 55%)  8/11-8/12: 100% x 2 meals   Medications reviewed and include:  Vitamin C, Vitamin D3, Colace, MVI, Protonix, Sodium Chloride, Vitamin E, IV antibiotics  Labs reviewed: Sodium 130, Potassium 4.5, BUN 14, Creatinine 0.77   Micronutrient Profile: CRP 11 (7/24) Vitamin A 7.5 (low) Vitamin C 0.3 (low) Vitamin D 23.23 (low) Vitamin E alpha 6.1, gamma 0.9 (WNL) Zinc 31 (low) Folate 8.6 (WNL) Vitamin B12 284 (WNL)  UOP: 2000 mL x 24 hrs   Diet Order:   Diet Order             Diet regular Room service appropriate? Yes; Fluid consistency: Thin  Diet effective now                   EDUCATION NEEDS:   No education needs have been identified at this time  Skin:    Per WOC assessement 7/23: Full thickness wounds: - Right posterior knee 2 x 2 x 0.1cm - Right anterior knee 0.5 x 0.5 x 0.1cm and 5 x 3 x 0.2 cm - Right lower leg 3 x 2 x 0.2 cm - Left elbow 0.8 x 0.8 x 0.1 cm - Left posterior arm 4 x 2 x 0.1 cm - Left posterior knee 3 x 3 cm - Left anterior knee 7 x 7 x 0.2 cm - Left posterior calf - Left anterior foot 4 x 3 x 0.2 cm and 1.5 x 1.5 cm Unstageable Pressure Injury - Left outer ankle 4 x 4 cm  Stage 4 Pressure Injury - Left ischial tuberosity (5 x 4 x 5 cm) - Right Ischial tuberosity  Last BM:  8/15 - Type 2,  large  Height:   Ht Readings from Last 1 Encounters:  04/24/23 5\' 11"  (1.803 m)   Weight:  Wt Readings from Last 1 Encounters:  05/03/23 73 kg   Ideal Body Weight:  70.4 kg (adjusted by 10% for paralysis)  BMI:  Body mass index is 22.45 kg/m.  Estimated Nutritional Needs:  Kcal:  2200-2400 kcal/d Protein:  115-130 g/d Fluid:  >/=2.2L/d   Kirby Crigler RD, LDN Clinical Dietitian See [facility] for contact information.

## 2023-05-03 NOTE — Progress Notes (Signed)
PROGRESS NOTE        PATIENT DETAILS Name: Steve [name] Age: 32 y.o. 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  Initially admitted to the ICU-stabilized and subsequently transferred to [facility].  Evaluated by orthopedics-no surgical interventions recommended.  Evaluated by ID-with plans for 6 weeks of IV daptomycin/Rocephin.  Unfortunately-further hospital course complicated by intermittent fevers-upon further evaluation with MRI pelvis-he was found to have abscess.  See below for further details.     Social worker following for placement-as patient is not a candidate for outpatient IV antibiotics (history of drug use)Patient is a 32 y.o.  male    Significant studies: 7/22>> CT abdomen/pelvis: Septic arthritis left hip with osteomyelitis of left ischium/inferior pubic rami.  Posterior left hip dislocation. 7/23>> TTE: EF 60-65% 8/02>> CT pelvis: Unchanged left hip dislocation-chronic osteomyelitis left pelvis related to ischemic ulcer, possible ill-defined fluid collection adjacent to the ulcer. 8/02>> bilateral lower extremity Doppler: No DVT 8/03>> MRI pelvis: Rim enhancing collection posterior right proximal femur concerning for abscess, multiple small abscesses around proximal left femur  Significant microbiology data: 7/22>> blood culture: Staph aureus, group C streptococcus, Proteus 7/23>> blood culture: Proteus/E. Coli 7/24>> blood culture: No growth 8/01>> blood culture: Peptostreptococcus, Bacteroides 8/01>> blood culture: No  growth 8/07>> intraoperative tissue culture right hip: Proteus Mirabilis 8/07>> intraoperative tissue culture left hip: Proteus mirabilis  Procedures: 7/29>> PICC by IR 8/07>> bilateral hip excisional debridement  Consults: PCCM ID Ortho  Subjective: Patient in bed, appears comfortable, denies any headache, no fever, no chest pain or pressure, no shortness of breath , no abdominal pain. No new focal weakness.   Patient has informed the staff that he will be leaving AMA on the coming Friday when he has a ride to go back home.  He does not want to stay in the hospital any longer, he has zooms all responsibility for any adverse outcome.  He was extensively counseled by me multiple times.   Objective: Vitals: Blood pressure 92/65, pulse 89, temperature (!) 97.3 F (36.3 C), temperature source Oral, resp. rate 20, height 5\' 11"  (1.803 m), weight 73 kg, SpO2 97%.   Exam:  Awake Alert, diffuse weakness, functional quadriplegia, R. arm PICC line, Foley catheter Clinch.AT,PERRAL Supple Neck, No JVD,   Symmetrical Chest wall movement, Good air movement bilaterally, CTAB RRR,No Gallops, Rubs or new Murmurs,  +ve B.Sounds, Abd Soft, No tenderness,   No Cyanosis, Clubbing or edema   Assessment/Plan:  Septic shock-POA Acute on chronic osteomyelitis septic arthritis of the left hip  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 S/p surgical debridement of bilateral hips on 8/7 (intraoperative cultures positive for Proteus).  TSH on 8/1 stable, a.m. cortisol in 8/2 stable as well.  If hypothermia persists-repeat a.m. cortisol tomorrow. ID following-remains on daptomycin/ceftriaxone/metronidazole, end date tentative 06/04/23 Use warming blanket as needed  for mild  hypothermia, stays uncovered in the bed, counseled, keep room warm, TSH and random cortisol stable. Per ID-TEE not needed as patient will be on a prolonged course of antibiotics.   Note-not a candidate for home IV infusion therapies-Per social work-[facility]and hence [facility] inpatient [facility] at [facility] health to complete course of IV antibiotics.   Note patient has informed the staff earlier this morning that he will be leaving AMA on coming Friday when he has a ride back home, he wanted to leave AMA today but did not have a ride, he has talked to his spouse about it as well.   Acute on chronic normocytic anemia  Secondary to acute illness-superimposed on anemia of chronic disease, He is receiving 1 unit of packed RBC on 05/01/2023 and 1 more unit on 05/02/2023, no signs of bleeding, denies any blood in stool or melena, continue to monitor.      Hypotension Am cortisol on 8/2 was > 20 but subsequently much lower, Cosyntropin cosyntropin stimulation test is stable but borderline normal with 60-minute cortisol level 18.5, will continue to monitor will have low threshold of therapeutic trial with steroids in the future. midodrine and albumin for now.  Further diuretics.   Hyponatremia Likely SIADH even after fluid restriction and Lasix persistent hyponatremia much improved after Samsca on 04/30/2023.   Normocytic anemia Due to acute illness/chronic inflammation from sacral osteomyelitis/ulcers Continue to Hb-required PRBC transfusion on 8/4.   GERD PPI   Vitamin D deficiency continue with supplements   Vitamin B12 deficiency Continue supplementation   Vitamin A, C, D deficiency, zinc deficiency Continue with supplements   Neurogenic bladder/UTI Paraplegia Continue Foley, urine culture growing Proteus, continue with Rocephin   Schizophrenia/PTSD Seems stable Continue Abilify   Substance abuse  Self reports use of methamphetamines    3  mm left solid pulmonary nodule Further workup deferred to outpatient setting-as this is an incidental finding  R Amrm PICC, Foley catheter   Nutrition Status: Nutrition Problem: Inadequate oral intake Etiology: decreased appetite Signs/Symptoms: per patient/family report Interventions: Ensure Enlive (each supplement provides 350kcal and 20 grams of protein), MVI, Juven, Snacks    Pressure Ulcer: Pressure Injury 05/30/21 Ankle Left;Lateral Unstageable - Full thickness tissue loss in which the base of the injury is covered by slough (yellow, tan, gray, green or brown) and/or eschar (tan, brown or black) in the wound bed. (Active)  05/30/21 1938  Location: Ankle  Location Orientation: Left;Lateral  Staging: Unstageable - Full thickness tissue loss in which the base of the injury is covered by slough (yellow, tan, gray, green or brown) and/or eschar (tan, brown or black) in the wound bed.  Wound Description (Comments):   Present on Admission: Yes  Dressing Type Foam - Lift dressing to assess site every shift 05/02/23 1000     Pressure Injury 08/29/21 Ischial tuberosity Left Stage 4 - Full thickness tissue loss with exposed bone, tendon or muscle. 1O1W9 (Active)  08/29/21 0100  Location: Ischial tuberosity  Location Orientation: Left  Staging: Stage 4 - Full thickness tissue loss with exposed bone, tendon or muscle.  Wound Description (Comments): [id number]  Present on Admission: Yes  Dressing Type Foam - Lift dressing to assess site every shift 05/02/23 1000     Pressure Injury 04/09/23 Pretibial Right;Lateral Stage 2 -  Partial thickness loss of dermis presenting as a shallow open injury with a red, pink wound bed without slough. full thickness, NOT a pressure injury (Active)  04/09/23  1610  Location: Pretibial  Location Orientation: Right;Lateral  Staging: Stage 2 -  Partial thickness loss of dermis presenting as a shallow open injury with a red, pink wound bed without slough.  Wound  Description (Comments): full thickness, NOT a pressure injury  Present on Admission: Yes  Dressing Type Foam - Lift dressing to assess site every shift 05/02/23 1000     Pressure Injury 04/09/23 Ischial tuberosity Right Stage 4 - Full thickness tissue loss with exposed bone, tendon or muscle. (Active)  04/09/23 0100  Location: Ischial tuberosity  Location Orientation: Right  Staging: Stage 4 - Full thickness tissue loss with exposed bone, tendon or muscle.  Wound Description (Comments):   Present on Admission: Yes  Dressing Type Foam - Lift dressing to assess site every shift;Gauze (Comment);Moist to dry 05/02/23 1000     Pressure Injury 04/09/23 Elbow Left;Posterior Stage 2 -  Partial thickness loss of dermis presenting as a shallow open injury with a red, pink wound bed without slough. (Active)  04/09/23 0330  Location: Elbow  Location Orientation: Left;Posterior  Staging: Stage 2 -  Partial thickness loss of dermis presenting as a shallow open injury with a red, pink wound bed without slough.  Wound Description (Comments):   Present on Admission: Yes  Dressing Type Foam - Lift dressing to assess site every shift 05/02/23 1000    BMI : Estimated body mass index is 22.45 kg/m as calculated from the following:   Height as of this encounter: 5\' 11"  (1.803 m).   Weight as of this encounter: 73 kg.   Code status:   Code Status: Full Code   DVT Prophylaxis: SCDs Start: 04/24/23 1718 enoxaparin (LOVENOX) injection 40 mg Start: 04/16/23 2200 SCDs Start: 04/09/23 0140   Family Communication: None at bedside   Disposition Plan: Status is: Inpatient Remains inpatient appropriate because: Severity of illness   Planned Discharge Destination:Home if willing but will remain inpatient to complete approximately 6 weeks of IV antibiotics-as patient not a candidate for outpatient therapies.   Diet: Diet Order             Diet regular Room service appropriate? Yes; Fluid consistency:  Thin  Diet effective now                   MEDICATIONS: Scheduled Meds:  ARIPiprazole  15 mg Oral Daily   ascorbic acid  500 mg Oral Daily   Chlorhexidine Gluconate Cloth  6 each Topical Daily   cholecalciferol  1,000 Units Oral Daily   cyanocobalamin  1,000 mcg Subcutaneous Q30 days   docusate sodium  100 mg Oral BID   enoxaparin (LOVENOX) injection  40 mg Subcutaneous Q24H   feeding supplement  237 mL Oral TID BM   furosemide  20 mg Intravenous Once   leptospermum manuka honey  1 Application Topical Daily   lidocaine (PF)  10 mL Infiltration Once   midodrine  10 mg Oral Q8H   multivitamin with minerals  1 tablet Oral Daily   nutrition supplement (JUVEN)  1 packet Oral BID BM   nystatin   Topical TID   pantoprazole  40 mg Oral Daily   sodium chloride  1 g Oral BID WC   sulfamethoxazole-trimethoprim  2 tablet Oral Q12H   Vashe Wound Irrigation Optime  34 oz Topical Once   vitamin E  400 Units Oral Daily   Continuous Infusions:  sodium chloride     ampicillin-sulbactam (UNASYN) IV 3 g (05/03/23 0549)   DAPTOmycin (  CUBICIN) 600 mg in sodium chloride 0.9 % IVPB 600 mg (05/02/23 1400)   PRN Meds:.acetaminophen, alum & mag hydroxide-simeth, baclofen, bisacodyl, calcium carbonate, magnesium citrate, methocarbamol **OR** [DISCONTINUED] methocarbamol (ROBAXIN) IV, [DISCONTINUED] metoCLOPramide **OR** metoCLOPramide (REGLAN) injection, morphine injection, ondansetron **OR** ondansetron (ZOFRAN) IV, mouth rinse, oxyCODONE, polyethylene glycol   I have personally reviewed following labs and imaging studies  LABORATORY DATA:  Recent Labs  Lab 04/27/23 0430 04/29/23 0455 05/01/23 0416 05/02/23 0730 05/03/23 0223  WBC 7.7 11.3* 6.3 5.7 6.2  HGB 7.8* 7.6* 6.6* 7.3* 8.2*  HCT 25.7* 24.5* 21.5* 23.4* 26.4*  PLT 334 334 331 318 347  MCV 92.1 92.5 93.1 91.8 90.1  MCH 28.0 28.7 28.6 28.6 28.0  MCHC 30.4 31.0 30.7 31.2 31.1  RDW 16.0* 16.2* 16.7* 17.4* 16.8*    Recent Labs   Lab 04/27/23 0430 04/28/23 0644 04/29/23 0455 04/30/23 0428 05/01/23 0416 05/02/23 0332 05/03/23 0223  NA 131* 128* 129* 128* 132* 132* 130*  K 4.5 4.1 3.9 4.2 4.3 4.5 4.5  CL 95* 98 99 98 102 103 100  CO2 25 24 22 22 23 22  21*  ANIONGAP 11 6 8 8 7 7 9   GLUCOSE 96 97 133* 87 86 87 80  BUN 16 12 15 12 13 12 14   CREATININE 0.61 0.54* 0.58* 0.56* 0.54* 0.51* 0.77  AST  --   --   --   --  12*  --   --   ALT  --   --   --   --  12  --   --   ALKPHOS  --   --   --   --  81  --   --   BILITOT  --   --   --   --  0.4  --   --   ALBUMIN  --   --   --   --  1.6*  --   --   BNP 94.7 66.4 107.9* 31.5  --   --   --   MG 2.1 2.1 2.0 2.1  --   --   --   CALCIUM 7.6* 7.6* 7.5* 7.4* 7.5* 7.8* 7.9*    Recent Labs  Lab 04/27/23 0430 04/28/23 0644 04/29/23 0455 04/30/23 0428 05/01/23 0416 05/02/23 0332 05/03/23 0223  BNP 94.7 66.4 107.9* 31.5  --   --   --   MG 2.1 2.1 2.0 2.1  --   --   --   CALCIUM 7.6* 7.6* 7.5* 7.4* 7.5* 7.8* 7.9*     Sepsis Labs: Lactic Acid, Venous    Component Value Date/Time   LATICACIDVEN 2.1 (HH) 04/08/2023 2219    RADIOLOGY STUDIES/RESULTS: No results found.   LOS: 24 days   Signature  -    Susa Raring M.D on 05/03/2023 at 10:43 AM   -  To page go to [email or web address]

## 2023-05-03 NOTE — Discharge Summary (Signed)
AMA  Patient at this time expresses desire to leave the Hospital immidiately, patient has been warned that this is not Medically advisable at this time, and can result in Medical complications like Death and Disability, patient understands and accepts the risks involved and assumes full responsibilty of this decision.   Susa Raring M.D on 05/03/2023 at 5:41 PM  [facility]  Time < 30 minutes  Last Note Below                           PROGRESS NOTE        PATIENT DETAILS Name: Steve [name] Age: 32 y.o. Sex: male Date of Birth: June 25, 1991 Admit Date: 04/08/2023 Admitting Physician Briant Sites, DO UJW:JXBJYN, [facility]  Brief Summary: 32 year old male with with history of paraplegia-known sacral decubitus ulcers with chronic osteomyelitis, substance abuse, chronic hypotension on midodrine-presented with septic shock in the setting of polymicrobial bacteremia and acute on chronic osteomyelitis of sacral decubitus ulcers and left hip septic arthritis.  Initially admitted to the ICU-stabilized and subsequently transferred to [facility].  Evaluated by orthopedics-no surgical interventions recommended.  Evaluated by ID-with plans for 6 weeks of IV daptomycin/Rocephin.  Unfortunately-further hospital course complicated by intermittent fevers-upon further evaluation with MRI pelvis-he was found to have abscess.  See below for further details.     Social worker following for placement-as patient is not a candidate for outpatient IV antibiotics (history of drug use)Patient is a 32 y.o.  male    Significant studies: 7/22>> CT abdomen/pelvis: Septic arthritis left hip with osteomyelitis of left ischium/inferior pubic rami.  Posterior left hip dislocation. 7/23>> TTE: EF 60-65% 8/02>> CT pelvis: Unchanged left hip dislocation-chronic  osteomyelitis left pelvis related to ischemic ulcer, possible ill-defined fluid collection adjacent to the ulcer. 8/02>> bilateral lower extremity Doppler: No DVT 8/03>> MRI pelvis: Rim enhancing collection posterior right proximal femur concerning for abscess, multiple small abscesses around proximal left femur  Significant microbiology data: 7/22>> blood culture: Staph aureus, group C streptococcus, Proteus 7/23>> blood culture: Proteus/E. Coli 7/24>> blood culture: No growth 8/01>> blood culture: Peptostreptococcus, Bacteroides 8/01>> blood culture: No growth 8/07>> intraoperative tissue culture right hip: Proteus Mirabilis 8/07>> intraoperative tissue culture left hip: Proteus mirabilis  Procedures: 7/29>> PICC by IR 8/07>> bilateral hip excisional debridement  Consults: PCCM ID Ortho  Subjective: Patient in bed, appears comfortable, denies any headache, no fever, no chest pain or pressure, no shortness of breath , no abdominal pain. No new focal weakness.   Patient has informed the staff that he will be leaving AMA on the coming Friday when he has a ride to go back home.  He does not want to stay in the hospital any longer, he has zooms all responsibility for any adverse outcome.  He was extensively counseled by me multiple times.   Objective: Vitals: Blood pressure 110/84, pulse (!) 171, temperature (!) 97.3 F (36.3 C), temperature source Oral, resp. rate (!) 22, height 5\' 11"  (1.803 m), weight 73 kg, SpO2 (!) 76%.   Exam:  Awake Alert, diffuse weakness, functional quadriplegia, R. arm PICC line, Foley catheter [place].AT,PERRAL Supple Neck, No JVD,  Symmetrical Chest wall movement, Good air movement bilaterally, CTAB RRR,No Gallops, Rubs or new Murmurs,  +ve B.Sounds, Abd Soft, No tenderness,   No Cyanosis, Clubbing or edema   Assessment/Plan:  Septic shock-POA Acute on chronic osteomyelitis septic arthritis of the left hip  Pelvic abscess due to chronic pelvic  osteomyelitis, sacral decubitus ulcer Polymicrobial bacteremia with with MRSA, Group C Streptococcus and Proteus mirabilis  Proteus mirabilis UTI Sepsis physiology has resolved-however he is mildly hypothermic but looks clinically unchanged and stable. S/p surgical debridement of bilateral hips on 8/7 (intraoperative cultures positive for Proteus).  TSH on 8/1 stable, a.m. cortisol in 8/2 stable as well.  If hypothermia persists-repeat a.m. cortisol tomorrow. ID following-remains on daptomycin/ceftriaxone/metronidazole, end date tentative 06/04/23 Use warming blanket as needed for mild hypothermia, stays uncovered in the bed, counseled, keep room warm, TSH and random cortisol stable. Per ID-TEE not needed as patient will be on a prolonged course of antibiotics.   Note-not a candidate for home IV infusion therapies-Per social work-[facility]and hence will remain inpatient here at [facility] to complete course of IV antibiotics.   Note patient has informed the staff earlier this morning that he will be leaving AMA on coming Friday when he has a ride back home, he wanted to leave AMA today but did not have a ride, he has talked to his spouse about it as well.   Acute on chronic normocytic anemia  Secondary to acute illness-superimposed on anemia of chronic disease, He is receiving 1 unit of packed RBC on 05/01/2023 and 1 more unit on 05/02/2023, no signs of bleeding, denies any blood in stool or melena, continue to monitor.      Hypotension Am cortisol on 8/2 was > 20 but subsequently much lower, Cosyntropin cosyntropin stimulation test is stable but borderline normal with 60-minute cortisol level 18.5, will continue to monitor will have low threshold of therapeutic trial with steroids in the future. midodrine and albumin for now.  Further diuretics.   Hyponatremia Likely SIADH even after fluid restriction and Lasix persistent hyponatremia much improved after Samsca  on 04/30/2023.   Normocytic anemia Due to acute illness/chronic inflammation from sacral osteomyelitis/ulcers Continue to Hb-required PRBC transfusion on 8/4.   GERD PPI   Vitamin D deficiency continue with supplements   Vitamin B12 deficiency Continue supplementation   Vitamin A, C, D deficiency, zinc deficiency Continue with supplements   Neurogenic bladder/UTI Paraplegia Continue Foley, urine culture growing Proteus, continue with Rocephin   Schizophrenia/PTSD Seems stable Continue Abilify   Substance abuse  Self reports use of methamphetamines    3 mm left solid pulmonary nodule Further workup deferred to outpatient setting-as this is an incidental finding  R Amrm PICC, Foley catheter   Nutrition Status: Nutrition Problem: Inadequate oral intake Etiology: decreased appetite Signs/Symptoms: per patient/family report Interventions: Ensure Enlive (each supplement provides 350kcal and 20 grams of protein), MVI, Juven, Snacks    Pressure Ulcer: Pressure Injury 05/30/21 Ankle Left;Lateral Unstageable - Full thickness tissue loss in which the base of the injury is covered by slough (yellow, tan, gray, green or brown) and/or eschar (tan, brown or black) in the wound bed. (Active)  05/30/21 1938  Location: Ankle  Location Orientation: Left;Lateral  Staging: Unstageable - Full thickness tissue loss in which the base of the injury is covered by slough (yellow, tan, gray, green or brown) and/or eschar (tan, brown or black) in the wound bed.  Wound Description (Comments):   Present  on Admission: Yes  Dressing Type Foam - Lift dressing to assess site every shift 05/03/23 0900     Pressure Injury 08/29/21 Ischial tuberosity Left Stage 4 - Full thickness tissue loss with exposed bone, tendon or muscle. 6E3P2 (Active)  08/29/21 0100  Location: Ischial tuberosity  Location Orientation: Left  Staging: Stage 4 - Full thickness tissue loss with exposed bone, tendon or muscle.   Wound Description (Comments): [id number]  Present on Admission: Yes  Dressing Type Foam - Lift dressing to assess site every shift 05/03/23 0900     Pressure Injury 04/09/23 Pretibial Right;Lateral Stage 2 -  Partial thickness loss of dermis presenting as a shallow open injury with a red, pink wound bed without slough. full thickness, NOT a pressure injury (Active)  04/09/23 0351  Location: Pretibial  Location Orientation: Right;Lateral  Staging: Stage 2 -  Partial thickness loss of dermis presenting as a shallow open injury with a red, pink wound bed without slough.  Wound Description (Comments): full thickness, NOT a pressure injury  Present on Admission: Yes  Dressing Type Foam - Lift dressing to assess site every shift 05/03/23 0900     Pressure Injury 04/09/23 Ischial tuberosity Right Stage 4 - Full thickness tissue loss with exposed bone, tendon or muscle. (Active)  04/09/23 0100  Location: Ischial tuberosity  Location Orientation: Right  Staging: Stage 4 - Full thickness tissue loss with exposed bone, tendon or muscle.  Wound Description (Comments):   Present on Admission: Yes  Dressing Type Foam - Lift dressing to assess site every shift 05/03/23 0900     Pressure Injury 04/09/23 Elbow Left;Posterior Stage 2 -  Partial thickness loss of dermis presenting as a shallow open injury with a red, pink wound bed without slough. (Active)  04/09/23 0330  Location: Elbow  Location Orientation: Left;Posterior  Staging: Stage 2 -  Partial thickness loss of dermis presenting as a shallow open injury with a red, pink wound bed without slough.  Wound Description (Comments):   Present on Admission: Yes  Dressing Type Foam - Lift dressing to assess site every shift 05/03/23 0900    BMI : Estimated body mass index is 22.45 kg/m as calculated from the following:   Height as of this encounter: 5\' 11"  (1.803 m).   Weight as of this encounter: 73 kg.   Code status:   Code Status: Full Code    DVT Prophylaxis: SCDs Start: 04/24/23 1718 enoxaparin (LOVENOX) injection 40 mg Start: 04/16/23 2200 SCDs Start: 04/09/23 0140   Family Communication: None at bedside   Disposition Plan: Status is: Inpatient Remains inpatient appropriate because: Severity of illness   Planned Discharge Destination:Home if willing but will remain inpatient to complete approximately 6 weeks of IV antibiotics-as patient not a candidate for outpatient therapies.   Diet: Diet Order             Diet regular Room service appropriate? Yes; Fluid consistency: Thin  Diet effective now                   MEDICATIONS: Scheduled Meds:  ARIPiprazole  15 mg Oral Daily   ascorbic acid  500 mg Oral Daily   Chlorhexidine Gluconate Cloth  6 each Topical Daily   cholecalciferol  1,000 Units Oral Daily   cyanocobalamin  1,000 mcg Subcutaneous Q30 days   docusate sodium  100 mg Oral BID   enoxaparin (LOVENOX) injection  40 mg Subcutaneous Q24H   feeding supplement  237 mL Oral TID BM  leptospermum manuka honey  1 Application Topical Daily   midodrine  10 mg Oral Q8H   multivitamin with minerals  1 tablet Oral Daily   nutrition supplement (JUVEN)  1 packet Oral BID BM   nystatin   Topical TID   pantoprazole  40 mg Oral Daily   sodium chloride  1 g Oral BID WC   sulfamethoxazole-trimethoprim  2 tablet Oral Q12H   Continuous Infusions:  sodium chloride     ampicillin-sulbactam (UNASYN) IV 3 g (05/03/23 1233)   DAPTOmycin (CUBICIN) 600 mg in sodium chloride 0.9 % IVPB 600 mg (05/02/23 1400)   PRN Meds:.acetaminophen, alum & mag hydroxide-simeth, baclofen, bisacodyl, calcium carbonate, magnesium citrate, methocarbamol **OR** [DISCONTINUED] methocarbamol (ROBAXIN) IV, [DISCONTINUED] metoCLOPramide **OR** metoCLOPramide (REGLAN) injection, morphine injection, ondansetron **OR** ondansetron (ZOFRAN) IV, mouth rinse, oxyCODONE, polyethylene glycol   I have personally reviewed following labs and imaging  studies  LABORATORY DATA:  Recent Labs  Lab 04/27/23 0430 04/29/23 0455 05/01/23 0416 05/02/23 0730 05/03/23 0223  WBC 7.7 11.3* 6.3 5.7 6.2  HGB 7.8* 7.6* 6.6* 7.3* 8.2*  HCT 25.7* 24.5* 21.5* 23.4* 26.4*  PLT 334 334 331 318 347  MCV 92.1 92.5 93.1 91.8 90.1  MCH 28.0 28.7 28.6 28.6 28.0  MCHC 30.4 31.0 30.7 31.2 31.1  RDW 16.0* 16.2* 16.7* 17.4* 16.8*    Recent Labs  Lab 04/27/23 0430 04/28/23 0644 04/29/23 0455 04/30/23 0428 05/01/23 0416 05/02/23 0332 05/03/23 0223  NA 131* 128* 129* 128* 132* 132* 130*  K 4.5 4.1 3.9 4.2 4.3 4.5 4.5  CL 95* 98 99 98 102 103 100  CO2 25 24 22 22 23 22  21*  ANIONGAP 11 6 8 8 7 7 9   GLUCOSE 96 97 133* 87 86 87 80  BUN 16 12 15 12 13 12 14   CREATININE 0.61 0.54* 0.58* 0.56* 0.54* 0.51* 0.77  AST  --   --   --   --  12*  --   --   ALT  --   --   --   --  12  --   --   ALKPHOS  --   --   --   --  81  --   --   BILITOT  --   --   --   --  0.4  --   --   ALBUMIN  --   --   --   --  1.6*  --   --   BNP 94.7 66.4 107.9* 31.5  --   --   --   MG 2.1 2.1 2.0 2.1  --   --   --   CALCIUM 7.6* 7.6* 7.5* 7.4* 7.5* 7.8* 7.9*    Recent Labs  Lab 04/27/23 0430 04/28/23 0644 04/29/23 0455 04/30/23 0428 05/01/23 0416 05/02/23 0332 05/03/23 0223  BNP 94.7 66.4 107.9* 31.5  --   --   --   MG 2.1 2.1 2.0 2.1  --   --   --   CALCIUM 7.6* 7.6* 7.5* 7.4* 7.5* 7.8* 7.9*     Sepsis Labs: Lactic Acid, Venous    Component Value Date/Time   LATICACIDVEN 2.1 (HH) 04/08/2023 2219    RADIOLOGY STUDIES/RESULTS: No results found.   LOS: 24 days   Signature  -    Susa Raring M.D on 05/03/2023 at 5:41 PM   -  To page go to [email or web address]

## 2023-05-16 ENCOUNTER — Inpatient Hospital Stay: Payer: Self-pay | Admitting: Infectious Diseases

## 2024-05-16 ENCOUNTER — Emergency Department (HOSPITAL_COMMUNITY)
Admission: EM | Admit: 2024-05-16 | Discharge: 2024-05-16 | Disposition: A | Discharge status: Home / Self Care | Attending: Emergency Medicine | Admitting: Emergency Medicine

## 2024-05-16 ENCOUNTER — Emergency Department (HOSPITAL_COMMUNITY)

## 2024-05-16 DIAGNOSIS — Y732 Prosthetic and other implants, materials and accessory gastroenterology and urology devices associated with adverse incidents: Secondary | ICD-10-CM | POA: Insufficient documentation

## 2024-05-16 DIAGNOSIS — S31000A Unspecified open wound of lower back and pelvis without penetration into retroperitoneum, initial encounter: Secondary | ICD-10-CM | POA: Diagnosis not present

## 2024-05-16 DIAGNOSIS — T83021A Displacement of indwelling urethral catheter, initial encounter: Secondary | ICD-10-CM | POA: Insufficient documentation

## 2024-05-16 DIAGNOSIS — N390 Urinary tract infection, site not specified: Secondary | ICD-10-CM

## 2024-05-16 DIAGNOSIS — W06XXXA Fall from bed, initial encounter: Secondary | ICD-10-CM | POA: Diagnosis not present

## 2024-05-16 DIAGNOSIS — M8660 Other chronic osteomyelitis, unspecified site: Secondary | ICD-10-CM

## 2024-05-16 LAB — CBC WITH DIFFERENTIAL/PLATELET
Abs Immature Granulocytes: 0.01 K/uL (ref 0.00–0.07)
Basophils Absolute: 0 K/uL (ref 0.0–0.1)
Basophils Relative: 1 %
Eosinophils Absolute: 0.2 K/uL (ref 0.0–0.5)
Eosinophils Relative: 4 %
HCT: 26.7 % — ABNORMAL LOW (ref 39.0–52.0)
Hemoglobin: 7.7 g/dL — ABNORMAL LOW (ref 13.0–17.0)
Immature Granulocytes: 0 %
Lymphocytes Relative: 22 %
Lymphs Abs: 1 K/uL (ref 0.7–4.0)
MCH: 24.8 pg — ABNORMAL LOW (ref 26.0–34.0)
MCHC: 28.8 g/dL — ABNORMAL LOW (ref 30.0–36.0)
MCV: 85.9 fL (ref 80.0–100.0)
Monocytes Absolute: 0.2 K/uL (ref 0.1–1.0)
Monocytes Relative: 5 %
Neutro Abs: 3.2 K/uL (ref 1.7–7.7)
Neutrophils Relative %: 68 %
Platelets: 359 K/uL (ref 150–400)
RBC: 3.11 MIL/uL — ABNORMAL LOW (ref 4.22–5.81)
RDW: 18.3 % — ABNORMAL HIGH (ref 11.5–15.5)
WBC: 4.7 K/uL (ref 4.0–10.5)
nRBC: 0 % (ref 0.0–0.2)

## 2024-05-16 LAB — URINALYSIS, W/ REFLEX TO CULTURE (INFECTION SUSPECTED)
Bilirubin Urine: NEGATIVE
Glucose, UA: NEGATIVE mg/dL
Hgb urine dipstick: NEGATIVE
Ketones, ur: NEGATIVE mg/dL
Nitrite: POSITIVE — AB
Protein, ur: 100 mg/dL — AB
Specific Gravity, Urine: 1.029 (ref 1.005–1.030)
WBC, UA: >50 WBC/hpf (ref 0–5)
pH: 7 (ref 5.0–8.0)

## 2024-05-16 LAB — COMPREHENSIVE METABOLIC PANEL WITH GFR
ALT: 8 U/L (ref 0–44)
AST: <10 U/L — ABNORMAL LOW (ref 15–41)
Albumin: 2.2 g/dL — ABNORMAL LOW (ref 3.5–5.0)
Alkaline Phosphatase: 83 U/L (ref 38–126)
Anion gap: 9 (ref 5–15)
BUN: 11 mg/dL (ref 6–20)
CO2: 23 mmol/L (ref 22–32)
Calcium: 8.2 mg/dL — ABNORMAL LOW (ref 8.9–10.3)
Chloride: 108 mmol/L (ref 98–111)
Creatinine, Ser: 0.73 mg/dL (ref 0.61–1.24)
GFR, Estimated: >60 mL/min (ref >60)
Glucose, Bld: 88 mg/dL (ref 70–99)
Potassium: 4.2 mmol/L (ref 3.5–5.1)
Sodium: 140 mmol/L (ref 135–145)
Total Bilirubin: 0.2 mg/dL (ref 0.0–1.2)
Total Protein: 7.1 g/dL (ref 6.5–8.1)

## 2024-05-16 LAB — TYPE AND SCREEN
ABO/RH(D): A POS
Antibody Screen: NEGATIVE

## 2024-05-16 LAB — I-STAT CHEM 8, ED
BUN: 10 mg/dL (ref 6–20)
Calcium, Ion: 1.11 mmol/L — ABNORMAL LOW (ref 1.15–1.40)
Chloride: 109 mmol/L (ref 98–111)
Creatinine, Ser: 0.8 mg/dL (ref 0.61–1.24)
Glucose, Bld: 87 mg/dL (ref 70–99)
HCT: 25 % — ABNORMAL LOW (ref 39.0–52.0)
Hemoglobin: 8.5 g/dL — ABNORMAL LOW (ref 13.0–17.0)
Potassium: 4.2 mmol/L (ref 3.5–5.1)
Sodium: 142 mmol/L (ref 135–145)
TCO2: 21 mmol/L — ABNORMAL LOW (ref 22–32)

## 2024-05-16 MED ORDER — CEPHALEXIN 500 MG PO CAPS: 500.0000 mg | ORAL_CAPSULE | Freq: Four times a day (QID) | ORAL | 0 refills | Status: AC

## 2024-05-16 MED ORDER — FENTANYL CITRATE PF 50 MCG/ML IJ SOSY
50.0000 ug | PREFILLED_SYRINGE | Freq: Once | INTRAMUSCULAR | Status: AC
  Administered 2024-05-16: 50 ug via INTRAVENOUS
  Filled 2024-05-16: qty 1

## 2024-05-16 MED ORDER — IOHEXOL 350 MG/ML SOLN
75.0000 mL | Freq: Once | INTRAVENOUS | Status: AC | PRN
  Administered 2024-05-16: 75 mL via INTRAVENOUS

## 2024-05-16 MED ORDER — SODIUM CHLORIDE 0.9 % IV SOLN: INTRAVENOUS | Status: DC

## 2024-05-16 NOTE — ED Provider Notes (Addendum)
 [place] EMERGENCY DEPARTMENT AT [facility] Provider Note   CSN: [id number] Arrival date & time: 05/16/24  1722     Patient presents with: Steve [name] is a 33 y.o. male.   33 year old male with history of paraplegia presents due to bleeding from his sacral wound.  Patient states that he has been having lots of pain in that area and fell out of bed.  Did not check his head no loss of consciousness.  Has had bleeding from the area since then.  Also states his Foley catheter came out as well.  EMS was called and they estimate approximately half a liter of blood at the patient's residence.  Patient given fentanyl  and transported here.       Prior to Admission medications   Medication Sig Start Date End Date Taking? Authorizing Provider  acetaminophen  (TYLENOL ) 325 MG tablet Take 650 mg by mouth 2 (two) times daily as needed for mild pain. 03/16/22   [provider]  ARIPiprazole  (ABILIFY ) 30 MG tablet Take 15 mg by mouth daily. 03/16/22   [provider]  Benzalkonium Chloride (REMEDY ANTIMICROBIAL CLEANSER EX) Apply 1 Application topically daily.    [provider]  Calcium  Carbonate 500 MG CHEW Chew 500 mg by mouth 2 (two) times daily as needed (gerd). 03/16/22   [provider]  midodrine  (PROAMATINE ) 5 MG tablet Take 5 mg by mouth 2 (two) times daily as needed (dizziness when sitting up). 03/16/22   [provider]  moxifloxacin (AVELOX) 400 MG tablet Take 400 mg by mouth daily at 8 pm. For skin infection    [provider]  Nutritional Supplements (ENSURE ORIGINAL) LIQD Take 1 Can by mouth 3 (three) times daily between meals.    [provider]  omeprazole (PRILOSEC) 40 MG capsule Take 40 mg by mouth daily.    [provider]  pregabalin  (LYRICA ) 150 MG capsule Take 150 mg by mouth 2 (two) times daily.    [provider]  sertraline  (ZOLOFT ) 50 MG tablet Take 25 mg by mouth daily.  03/16/22   [provider]  tiZANidine  (ZANAFLEX ) 4 MG tablet Take 4 mg by mouth at bedtime as needed for muscle spasms. 03/16/22   [provider]  traZODone  (DESYREL ) 50 MG tablet Take 50 mg by mouth at bedtime. 03/16/22   [provider]  trospium (SANCTURA) 20 MG tablet Take 20 mg by mouth 2 (two) times daily.    [provider]  prazosin  (MINIPRESS ) 2 MG capsule Take 1 capsule (2 mg total) by mouth at bedtime. Patient not taking: Reported on 09/03/2017 11/04/16 09/14/19  Cathryne Sherrell BRAVO, NP    Allergies: Caffeine; Chocolate; Bactrim  [sulfamethoxazole -trimethoprim ]; Tuberculin, ppd; and Sulfa  antibiotics    Review of Systems  All other systems reviewed and are negative.   Updated Vital Signs BP (!) 147/125 (BP Location: Right Arm)   Pulse (!) 110   Temp 98.2 F (36.8 C) (Oral)   Resp 20   Ht 1.803 m (5' 11)   Wt 72.6 kg   SpO2 100%   BMI 22.32 kg/m   Physical Exam Vitals and nursing note reviewed. Exam conducted with a chaperone present.  Constitutional:      General: He is not in acute distress.    Appearance: Normal appearance. He is well-developed. He is not toxic-appearing.  HENT:     Head: Normocephalic and atraumatic.  Eyes:     General: Lids are normal.  Conjunctiva/sclera: Conjunctivae normal.     Pupils: Pupils are equal, round, and reactive to light.  Neck:     Thyroid: No thyroid mass.     Trachea: No tracheal deviation.  Cardiovascular:     Rate and Rhythm: Normal rate and regular rhythm.     Heart sounds: Normal heart sounds. No murmur heard.    No gallop.  Pulmonary:     Effort: Pulmonary effort is normal. No respiratory distress.     Breath sounds: Normal breath sounds. No stridor. No decreased breath sounds, wheezing, rhonchi or rales.  Abdominal:     General: There is no distension.     Palpations: Abdomen is soft.     Tenderness: There is no abdominal tenderness. There is no rebound.   Genitourinary:    Musculoskeletal:        General: No tenderness. Normal range of motion.     Cervical back: Normal range of motion and neck supple.  Skin:    General: Skin is warm and dry.     Findings: No abrasion or rash.  Neurological:     Mental Status: He is alert and oriented to person, place, and time. Mental status is at baseline.     GCS: GCS eye subscore is 4. GCS verbal subscore is 5. GCS motor subscore is 6.     Cranial Nerves: No cranial nerve deficit.     Sensory: No sensory deficit.  Psychiatric:        Attention and Perception: Attention normal.        Speech: Speech normal.        Behavior: Behavior normal.     (all labs ordered are listed, but only abnormal results are displayed) Labs Reviewed  CBC WITH DIFFERENTIAL/PLATELET  COMPREHENSIVE METABOLIC PANEL WITH GFR  I-STAT CHEM 8, ED  TYPE AND SCREEN    EKG: None  Radiology: No results found.   Procedures   Medications Ordered in the ED  0.9 %  sodium chloride  infusion (has no administration in time range)                                    Medical Decision Making Amount and/or Complexity of Data Reviewed Labs: ordered. Radiology: ordered.  Risk Prescription drug management.   Patient's urinary catheter placed by nursing after discussion with urologist on-call.  Patient hemoglobin is stable here.  Do not feel that he needs a blood transfusion.  Given pain medication here.  CT pelvis shows no acute fracture.  He does however show possible chronic osteomyelitis.  Patient will need to follow-up with his doctor for this.  Urinalysis consistent with infection and will place on antibiotics.    Final diagnoses:  None    ED Discharge Orders     None          Dasie Faden, MD 05/16/24 2233    Dasie Faden, MD 05/16/24 2241

## 2024-05-16 NOTE — Discharge Instructions (Addendum)
 Call your doctor this week to schedule follow-up visit for treatment for your chronic osteomyelitis

## 2024-05-16 NOTE — ED Triage Notes (Signed)
 Pt BIB [facility] from home for fall. Pt is a paraplegic, tried to get out of bed on his own, fell from sitting. Pt has existing sacral wound that was found to be bleeding on EMS arrival, bleeding is now controlled. EMS estimates 500 mL blood loss from wound. Pt also reports accidentally pulling his foley catheter out while falling. No pain or trauma noted by EMS. 100 mcgs fentanyl  given by EMS. Pt reporting lower back pain. VSS per EMS.

## 2024-05-19 LAB — URINE CULTURE: Culture: >=100000 — AB

## 2024-05-20 ENCOUNTER — Telehealth (HOSPITAL_BASED_OUTPATIENT_CLINIC_OR_DEPARTMENT_OTHER): Payer: Self-pay | Admitting: *Deleted

## 2024-05-20 NOTE — Progress Notes (Signed)
 ED Antimicrobial Stewardship Positive Culture Follow Up   Steve [name] is an 33 y.o. male who presented to [facility] on 05/16/2024 with a chief complaint of  Chief Complaint  Patient presents with   Fall    Recent Results (from the past 720 hours)  Urine Culture     Status: Abnormal   Collection Time: 05/16/24  9:55 PM   Specimen: Urine, Random  Result Value Ref Range Status   Specimen Description URINE, RANDOM  Final   Special Requests   Final    NONE Reflexed from [id number] Performed at [facility], [address], [place], [place] [address]    Culture (A)  Final    >=100,000 COLONIES/mL ESCHERICHIA COLI 80,000 COLONIES/mL MORGANELLA MORGANII    Report Status 05/19/2024 FINAL  Final   Organism ID, Bacteria MORGANELLA MORGANII (A)  Final   Organism ID, Bacteria ESCHERICHIA COLI (A)  Final      Susceptibility   Escherichia coli - MIC*    AMPICILLIN  >=32 RESISTANT Resistant     CEFAZOLIN  (URINE) Value in next row Resistant      >=32 RESISTANTThis is a modified FDA-approved test that has been validated and its performance characteristics determined by the reporting laboratory.  This laboratory is certified under the Clinical Laboratory Improvement Amendments CLIA as qualified to perform high complexity clinical laboratory testing.    CEFEPIME  Value in next row Sensitive      >=32 RESISTANTThis is a modified FDA-approved test that has been validated and its performance characteristics determined by the reporting laboratory.  This laboratory is certified under the Clinical Laboratory Improvement Amendments CLIA as qualified to perform high complexity clinical laboratory testing.    ERTAPENEM Value in next row Sensitive      >=32 RESISTANTThis is a modified FDA-approved test that has been validated and its performance characteristics determined by the reporting laboratory.  This laboratory is certified under the Clinical Laboratory Improvement Amendments CLIA as qualified to  perform high complexity clinical laboratory testing.    CEFTRIAXONE  Value in next row Resistant      >=32 RESISTANTThis is a modified FDA-approved test that has been validated and its performance characteristics determined by the reporting laboratory.  This laboratory is certified under the Clinical Laboratory Improvement Amendments CLIA as qualified to perform high complexity clinical laboratory testing.    CIPROFLOXACIN  Value in next row Sensitive      >=32 RESISTANTThis is a modified FDA-approved test that has been validated and its performance characteristics determined by the reporting laboratory.  This laboratory is certified under the Clinical Laboratory Improvement Amendments CLIA as qualified to perform high complexity clinical laboratory testing.    GENTAMICIN Value in next row Sensitive      >=32 RESISTANTThis is a modified FDA-approved test that has been validated and its performance characteristics determined by the reporting laboratory.  This laboratory is certified under the Clinical Laboratory Improvement Amendments CLIA as qualified to perform high complexity clinical laboratory testing.    NITROFURANTOIN Value in next row Sensitive      >=32 RESISTANTThis is a modified FDA-approved test that has been validated and its performance characteristics determined by the reporting laboratory.  This laboratory is certified under the Clinical Laboratory Improvement Amendments CLIA as qualified to perform high complexity clinical laboratory testing.    TRIMETH /SULFA  Value in next row Sensitive      >=32 RESISTANTThis is a modified FDA-approved test that has been validated and its performance characteristics determined by the reporting laboratory.  This laboratory is certified under the Clinical Laboratory Improvement Amendments CLIA as qualified to perform high complexity clinical laboratory testing.    AMPICILLIN /SULBACTAM Value in next row Resistant      >=32 RESISTANTThis is a modified  FDA-approved test that has been validated and its performance characteristics determined by the reporting laboratory.  This laboratory is certified under the Clinical Laboratory Improvement Amendments CLIA as qualified to perform high complexity clinical laboratory testing.    PIP/TAZO Value in next row Sensitive ug/mL     <=4 SENSITIVEThis is a modified FDA-approved test that has been validated and its performance characteristics determined by the reporting laboratory.  This laboratory is certified under the Clinical Laboratory Improvement Amendments CLIA as qualified to perform high complexity clinical laboratory testing.    MEROPENEM  Value in next row Sensitive      <=4 SENSITIVEThis is a modified FDA-approved test that has been validated and its performance characteristics determined by the reporting laboratory.  This laboratory is certified under the Clinical Laboratory Improvement Amendments CLIA as qualified to perform high complexity clinical laboratory testing.    * >=100,000 COLONIES/mL ESCHERICHIA COLI   Morganella morganii - MIC*    AMPICILLIN  Value in next row Resistant      <=4 SENSITIVEThis is a modified FDA-approved test that has been validated and its performance characteristics determined by the reporting laboratory.  This laboratory is certified under the Clinical Laboratory Improvement Amendments CLIA as qualified to perform high complexity clinical laboratory testing.    ERTAPENEM Value in next row Sensitive      <=4 SENSITIVEThis is a modified FDA-approved test that has been validated and its performance characteristics determined by the reporting laboratory.  This laboratory is certified under the Clinical Laboratory Improvement Amendments CLIA as qualified to perform high complexity clinical laboratory testing.    CIPROFLOXACIN  Value in next row Sensitive      <=4 SENSITIVEThis is a modified FDA-approved test that has been validated and its performance characteristics determined  by the reporting laboratory.  This laboratory is certified under the Clinical Laboratory Improvement Amendments CLIA as qualified to perform high complexity clinical laboratory testing.    GENTAMICIN Value in next row Sensitive      <=4 SENSITIVEThis is a modified FDA-approved test that has been validated and its performance characteristics determined by the reporting laboratory.  This laboratory is certified under the Clinical Laboratory Improvement Amendments CLIA as qualified to perform high complexity clinical laboratory testing.    NITROFURANTOIN Value in next row Resistant      <=4 SENSITIVEThis is a modified FDA-approved test that has been validated and its performance characteristics determined by the reporting laboratory.  This laboratory is certified under the Clinical Laboratory Improvement Amendments CLIA as qualified to perform high complexity clinical laboratory testing.    TRIMETH /SULFA  Value in next row Sensitive      <=4 SENSITIVEThis is a modified FDA-approved test that has been validated and its performance characteristics determined by the reporting laboratory.  This laboratory is certified under the Clinical Laboratory Improvement Amendments CLIA as qualified to perform high complexity clinical laboratory testing.    AMPICILLIN /SULBACTAM Value in next row Resistant      <=4 SENSITIVEThis is a modified FDA-approved test that has been validated and its performance characteristics determined by the reporting laboratory.  This laboratory is certified under the Clinical Laboratory Improvement Amendments CLIA as qualified to perform high complexity clinical laboratory testing.    PIP/TAZO Value in next row Sensitive ug/mL     <=  4 SENSITIVEThis is a modified FDA-approved test that has been validated and its performance characteristics determined by the reporting laboratory.  This laboratory is certified under the Clinical Laboratory Improvement Amendments CLIA as qualified to perform high  complexity clinical laboratory testing.    MEROPENEM  Value in next row Sensitive      <=4 SENSITIVEThis is a modified FDA-approved test that has been validated and its performance characteristics determined by the reporting laboratory.  This laboratory is certified under the Clinical Laboratory Improvement Amendments CLIA as qualified to perform high complexity clinical laboratory testing.    * 80,000 COLONIES/mL MORGANELLA MORGANII    [x]  Treated with cephalexin , organism resistant to prescribed antimicrobial   New antibiotic prescription: Stop cephalexin .  No further antibiotic treatment needed.  ED Provider: Darice Showers, PA-C   Celestia Venetia Rush 05/20/2024, 9:25 AM Clinical Pharmacist Monday - Friday phone -  [phone] Saturday - Sunday phone - [phone]

## 2024-05-20 NOTE — Telephone Encounter (Signed)
 Post ED Visit - Positive Culture Follow-up: Unsuccessful Patient Follow-up  Culture assessed and recommendations reviewed by:  []  Rankin Dee, Pharm.D. [x]  Venetia Gully, Pharm.D., BCPS AQ-ID []  Garrel Crews, Pharm.D., BCPS []  Almarie Lunger, Pharm.D., BCPS []  [place], [address].D., BCPS, AAHIVP []  Rosaline Bihari, Pharm.D., BCPS, AAHIVP []  Massie Rigg, PharmD []  Jodie Rower, PharmD, BCPS  Positive urine culture  []  Patient discharged without antimicrobial prescription and treatment is now indicated [x]  Organism is resistant to prescribed ED discharge antimicrobial []  Patient with positive blood cultures  Culture results reviewed by [name] and plan is to stop cephalexin .   Unable to contact patient after 3 attempts, letter will be sent to address on file  Steve [name] 05/20/2024, 11:22 AM

## 2024-05-26 ENCOUNTER — Encounter (HOSPITAL_BASED_OUTPATIENT_CLINIC_OR_DEPARTMENT_OTHER): Attending: Internal Medicine | Admitting: Internal Medicine
